# Patient Record
Sex: Male | Born: 1940 | Race: White | Hispanic: No | Marital: Married | Smoking: Former smoker
Health system: Southern US, Community
[De-identification: ages and names within clinical notes are randomized; demographics above are authoritative.]

## PROBLEM LIST (undated history)

## (undated) DIAGNOSIS — R0989 Other specified symptoms and signs involving the circulatory and respiratory systems: Secondary | ICD-10-CM

## (undated) DIAGNOSIS — J189 Pneumonia, unspecified organism: Secondary | ICD-10-CM

## (undated) DIAGNOSIS — I2510 Atherosclerotic heart disease of native coronary artery without angina pectoris: Secondary | ICD-10-CM

## (undated) DIAGNOSIS — M1990 Unspecified osteoarthritis, unspecified site: Secondary | ICD-10-CM

## (undated) DIAGNOSIS — K589 Irritable bowel syndrome without diarrhea: Secondary | ICD-10-CM

## (undated) DIAGNOSIS — Z8546 Personal history of malignant neoplasm of prostate: Secondary | ICD-10-CM

## (undated) DIAGNOSIS — C801 Malignant (primary) neoplasm, unspecified: Secondary | ICD-10-CM

## (undated) DIAGNOSIS — J449 Chronic obstructive pulmonary disease, unspecified: Secondary | ICD-10-CM

## (undated) DIAGNOSIS — K5730 Diverticulosis of large intestine without perforation or abscess without bleeding: Secondary | ICD-10-CM

## (undated) DIAGNOSIS — S329XXA Fracture of unspecified parts of lumbosacral spine and pelvis, initial encounter for closed fracture: Secondary | ICD-10-CM

## (undated) DIAGNOSIS — R918 Other nonspecific abnormal finding of lung field: Secondary | ICD-10-CM

## (undated) DIAGNOSIS — I714 Abdominal aortic aneurysm, without rupture, unspecified: Secondary | ICD-10-CM

## (undated) DIAGNOSIS — I700 Atherosclerosis of aorta: Secondary | ICD-10-CM

## (undated) DIAGNOSIS — K635 Polyp of colon: Secondary | ICD-10-CM

## (undated) DIAGNOSIS — R0602 Shortness of breath: Secondary | ICD-10-CM

## (undated) DIAGNOSIS — R011 Cardiac murmur, unspecified: Secondary | ICD-10-CM

## (undated) HISTORY — PX: HERNIA REPAIR: SHX51

## (undated) HISTORY — DX: Fracture of unspecified parts of lumbosacral spine and pelvis, initial encounter for closed fracture: S32.9XXA

## (undated) HISTORY — DX: Diverticulosis of large intestine without perforation or abscess without bleeding: K57.30

## (undated) HISTORY — DX: Chronic obstructive pulmonary disease, unspecified: J44.9

## (undated) HISTORY — DX: Irritable bowel syndrome, unspecified: K58.9

## (undated) HISTORY — DX: Abdominal aortic aneurysm, without rupture: I71.4

## (undated) HISTORY — DX: Atherosclerosis of aorta: I70.0

## (undated) HISTORY — DX: Abdominal aortic aneurysm, without rupture, unspecified: I71.40

## (undated) HISTORY — DX: Other specified symptoms and signs involving the circulatory and respiratory systems: R09.89

## (undated) HISTORY — DX: Personal history of malignant neoplasm of prostate: Z85.46

## (undated) HISTORY — DX: Polyp of colon: K63.5

## (undated) HISTORY — PX: OTHER SURGICAL HISTORY: SHX169

## (undated) HISTORY — PX: TONSILLECTOMY: SUR1361

## (undated) HISTORY — DX: Unspecified osteoarthritis, unspecified site: M19.90

## (undated) HISTORY — DX: Other nonspecific abnormal finding of lung field: R91.8

## (undated) HISTORY — DX: Atherosclerotic heart disease of native coronary artery without angina pectoris: I25.10

## (undated) HISTORY — PX: JOINT REPLACEMENT: SHX530

## (undated) NOTE — *Deleted (*Deleted)
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 Moved to ICU 11/2 for resp failure Vol overload, admit pH 7.2, pCO2 52, bicarb 22  Anticoagulation - eliquis 2.5BID x4w s/p DCCV >> heparin gtt Infectious Disease - fluconazole 200q24, (Few candida on sputum culture from 10/28), zosyn 3.375g q8, vanc 1g q24h (d/cd neg mrsa swab) Cardiovascular - EF 50-55%  amio gtt>>PO, ator10, lopressor 62.5 >>25 Endocrinology - cbgs <180 Gastrointestinal / Nutrition - TF, ppi Neurology -precedex, propofol gtts   Nephrology - AKI  SCr 10/26 1.7>> 10/29 2.6 >>11/2 2.5>>  11/3 2.7 > 2.6 > 2.5 >>2.1 UOP improving w/ diuresis, 11/2 (s/p lasix 160 + 25g albumin)  Aranesp 100 mcg qMon for anemia of AKI * Outpatient ESA/iron orders: N/a * Last Tsat and ferritin - N/a (transfusion on 10/30) * Last doses of Aranesp given on 11/8 * Hgb 11.2 on 11/9, next dose due 11/15 - will need to ensure Hgb <11 before then  PLT WNL. next dose due 11/8   TSAT - N/a - transfused on 10/30 --K - 3.6 (replacing w/ kcit 11/4) >> 4.3 CoCa >9, phos 3-4 NaHCO3 tabs 1,300mg  BID >> HCO3 improved, stopped   Albumin 2.5  Pulmonary infiltrates on CXR. Intubated 11/5 AM for WOB and O2 reqs Hematology / Oncology PTA Medication Issues Best Practices   AKI, met/resp acidosis + diuresis Continue monitoring SCr, UOP and lytes w/ IV diuresis ---KCl >> k citrate, replaced mag -albumin and NaHCO3 d/cd today -Watch Na - free water incr to 300q4  Hypertension -cont metop  -monitor bp, consider adding amlo or hydral if needed  Afib w/ RVR Amiodarone PO 11/4 >> drip 11/9 Cont heparin, lopressor Hgb stable  Pulmonary infiltrates c/f PNA Intubated, f/u trach 11/10  D/cd vancomycin Cont zosyn to 11/5 (7d) - may need longer

---

## 1990-08-29 HISTORY — PX: PROSTATECTOMY: SHX69

## 1997-09-28 HISTORY — PX: KNEE ARTHROSCOPY: SUR90

## 1998-08-29 ENCOUNTER — Encounter: Payer: Self-pay | Admitting: Family Medicine

## 2001-04-21 ENCOUNTER — Encounter: Payer: Self-pay | Admitting: Family Medicine

## 2001-04-21 LAB — CONVERTED CEMR LAB: PSA: 0 ng/mL

## 2001-05-25 ENCOUNTER — Ambulatory Visit (HOSPITAL_COMMUNITY): Admission: RE | Admit: 2001-05-25 | Discharge: 2001-05-25 | Payer: Self-pay | Admitting: Gastroenterology

## 2001-05-25 ENCOUNTER — Encounter (INDEPENDENT_AMBULATORY_CARE_PROVIDER_SITE_OTHER): Payer: Self-pay

## 2001-06-07 ENCOUNTER — Encounter: Payer: Self-pay | Admitting: Neurosurgery

## 2001-06-07 ENCOUNTER — Ambulatory Visit (HOSPITAL_COMMUNITY): Admission: RE | Admit: 2001-06-07 | Discharge: 2001-06-07 | Payer: Self-pay | Admitting: Neurosurgery

## 2003-07-21 LAB — FECAL OCCULT BLOOD, GUAIAC: Fecal Occult Blood: NEGATIVE

## 2005-05-19 ENCOUNTER — Ambulatory Visit: Payer: Self-pay | Admitting: Family Medicine

## 2005-07-28 ENCOUNTER — Ambulatory Visit: Payer: Self-pay | Admitting: Family Medicine

## 2005-09-04 ENCOUNTER — Ambulatory Visit: Payer: Self-pay | Admitting: General Surgery

## 2005-09-23 ENCOUNTER — Encounter: Admission: RE | Admit: 2005-09-23 | Discharge: 2005-09-23 | Payer: Self-pay | Admitting: Orthopedic Surgery

## 2005-10-14 ENCOUNTER — Encounter: Admission: RE | Admit: 2005-10-14 | Discharge: 2005-10-14 | Payer: Self-pay | Admitting: Orthopedic Surgery

## 2005-11-11 ENCOUNTER — Ambulatory Visit: Payer: Self-pay | Admitting: Family Medicine

## 2005-11-13 ENCOUNTER — Ambulatory Visit: Payer: Self-pay | Admitting: Family Medicine

## 2006-11-30 ENCOUNTER — Ambulatory Visit: Payer: Self-pay | Admitting: Family Medicine

## 2006-11-30 DIAGNOSIS — C61 Malignant neoplasm of prostate: Secondary | ICD-10-CM | POA: Insufficient documentation

## 2006-11-30 LAB — CONVERTED CEMR LAB
ALT: 18 units/L (ref 0–40)
AST: 22 units/L (ref 0–37)
Albumin: 3.9 g/dL (ref 3.5–5.2)
Alkaline Phosphatase: 63 units/L (ref 39–117)
BUN: 14 mg/dL (ref 6–23)
Basophils Absolute: 0 10*3/uL (ref 0.0–0.1)
Basophils Relative: 0.5 % (ref 0.0–1.0)
Bilirubin, Direct: 0.1 mg/dL (ref 0.0–0.3)
CO2: 28 meq/L (ref 19–32)
Calcium: 9.1 mg/dL (ref 8.4–10.5)
Chloride: 109 meq/L (ref 96–112)
Cholesterol: 173 mg/dL (ref 0–200)
Creatinine, Ser: 0.9 mg/dL (ref 0.4–1.5)
Eosinophils Absolute: 0.4 10*3/uL (ref 0.0–0.6)
Eosinophils Relative: 6.1 % — ABNORMAL HIGH (ref 0.0–5.0)
GFR calc Af Amer: 109 mL/min
GFR calc non Af Amer: 90 mL/min
Glucose, Bld: 98 mg/dL (ref 70–99)
HCT: 45.1 % (ref 39.0–52.0)
HDL: 41.9 mg/dL (ref >39.0)
Hemoglobin: 16 g/dL (ref 13.0–17.0)
LDL Cholesterol: 112 mg/dL — ABNORMAL HIGH (ref 0–99)
Lymphocytes Relative: 28.8 % (ref 12.0–46.0)
MCHC: 35.4 g/dL (ref 30.0–36.0)
MCV: 94.8 fL (ref 78.0–100.0)
Monocytes Absolute: 0.8 10*3/uL — ABNORMAL HIGH (ref 0.2–0.7)
Monocytes Relative: 11.1 % — ABNORMAL HIGH (ref 3.0–11.0)
Neutro Abs: 3.6 10*3/uL (ref 1.4–7.7)
Neutrophils Relative %: 53.5 % (ref 43.0–77.0)
PSA: 0.01 ng/mL — ABNORMAL LOW (ref 0.10–4.00)
Platelets: 239 10*3/uL (ref 150–400)
Potassium: 4.4 meq/L (ref 3.5–5.1)
RBC: 4.76 M/uL (ref 4.22–5.81)
RDW: 12.8 % (ref 11.5–14.6)
Sodium: 142 meq/L (ref 135–145)
TSH: 1.18 microintl units/mL (ref 0.35–5.50)
Total Bilirubin: 1 mg/dL (ref 0.3–1.2)
Total CHOL/HDL Ratio: 4.1
Total Protein: 6.4 g/dL (ref 6.0–8.3)
Triglycerides: 96 mg/dL (ref 0–149)
VLDL: 19 mg/dL (ref 0–40)
WBC: 6.8 10*3/uL (ref 4.5–10.5)

## 2006-12-01 ENCOUNTER — Encounter: Payer: Self-pay | Admitting: Family Medicine

## 2006-12-01 DIAGNOSIS — E785 Hyperlipidemia, unspecified: Secondary | ICD-10-CM | POA: Insufficient documentation

## 2006-12-01 DIAGNOSIS — G8929 Other chronic pain: Secondary | ICD-10-CM | POA: Insufficient documentation

## 2006-12-01 DIAGNOSIS — K5730 Diverticulosis of large intestine without perforation or abscess without bleeding: Secondary | ICD-10-CM | POA: Insufficient documentation

## 2006-12-01 DIAGNOSIS — Z8546 Personal history of malignant neoplasm of prostate: Secondary | ICD-10-CM | POA: Insufficient documentation

## 2006-12-01 DIAGNOSIS — J449 Chronic obstructive pulmonary disease, unspecified: Secondary | ICD-10-CM | POA: Insufficient documentation

## 2006-12-01 DIAGNOSIS — M549 Dorsalgia, unspecified: Secondary | ICD-10-CM

## 2006-12-02 ENCOUNTER — Ambulatory Visit: Payer: Self-pay | Admitting: Family Medicine

## 2006-12-02 DIAGNOSIS — Z87891 Personal history of nicotine dependence: Secondary | ICD-10-CM | POA: Insufficient documentation

## 2006-12-17 ENCOUNTER — Ambulatory Visit: Payer: Self-pay | Admitting: Family Medicine

## 2007-01-13 ENCOUNTER — Encounter (INDEPENDENT_AMBULATORY_CARE_PROVIDER_SITE_OTHER): Payer: Self-pay | Admitting: *Deleted

## 2007-01-13 ENCOUNTER — Ambulatory Visit: Payer: Self-pay | Admitting: Family Medicine

## 2007-10-18 ENCOUNTER — Telehealth: Payer: Self-pay | Admitting: Family Medicine

## 2007-12-29 HISTORY — PX: COLONOSCOPY: SHX174

## 2007-12-30 ENCOUNTER — Encounter: Payer: Self-pay | Admitting: Family Medicine

## 2007-12-30 LAB — HM COLONOSCOPY: HM Colonoscopy: 2

## 2008-01-24 ENCOUNTER — Encounter: Payer: Self-pay | Admitting: Family Medicine

## 2008-03-13 ENCOUNTER — Ambulatory Visit: Payer: Self-pay | Admitting: Family Medicine

## 2008-03-13 LAB — CONVERTED CEMR LAB
ALT: 17 units/L (ref 0–53)
Albumin: 3.9 g/dL (ref 3.5–5.2)
Alkaline Phosphatase: 67 units/L (ref 39–117)
BUN: 11 mg/dL (ref 6–23)
CO2: 30 meq/L (ref 19–32)
Calcium: 9.2 mg/dL (ref 8.4–10.5)
Eosinophils Relative: 5.6 % — ABNORMAL HIGH (ref 0.0–5.0)
GFR calc Af Amer: 108 mL/min
Glucose, Bld: 103 mg/dL — ABNORMAL HIGH (ref 70–99)
HCT: 47.5 % (ref 39.0–52.0)
Hemoglobin: 16.5 g/dL (ref 13.0–17.0)
Monocytes Absolute: 0.7 10*3/uL (ref 0.1–1.0)
Monocytes Relative: 10.6 % (ref 3.0–12.0)
Neutro Abs: 3.9 10*3/uL (ref 1.4–7.7)
Potassium: 5.2 meq/L — ABNORMAL HIGH (ref 3.5–5.1)
RBC: 4.82 M/uL (ref 4.22–5.81)
Total CHOL/HDL Ratio: 3.8
Total Protein: 6.5 g/dL (ref 6.0–8.3)
WBC: 6.6 10*3/uL (ref 4.5–10.5)

## 2008-03-15 ENCOUNTER — Ambulatory Visit: Payer: Self-pay | Admitting: Family Medicine

## 2008-11-06 ENCOUNTER — Telehealth: Payer: Self-pay | Admitting: Family Medicine

## 2008-11-07 ENCOUNTER — Telehealth: Payer: Self-pay | Admitting: Family Medicine

## 2009-01-26 ENCOUNTER — Encounter: Payer: Self-pay | Admitting: Family Medicine

## 2009-01-26 ENCOUNTER — Emergency Department: Payer: Self-pay | Admitting: Emergency Medicine

## 2009-01-27 ENCOUNTER — Encounter: Payer: Self-pay | Admitting: Family Medicine

## 2009-01-31 ENCOUNTER — Ambulatory Visit: Payer: Self-pay | Admitting: Family Medicine

## 2009-04-13 ENCOUNTER — Ambulatory Visit: Payer: Self-pay | Admitting: Family Medicine

## 2009-04-13 LAB — CONVERTED CEMR LAB
Albumin: 4.2 g/dL (ref 3.5–5.2)
Alkaline Phosphatase: 61 units/L (ref 39–117)
Basophils Absolute: 0 10*3/uL (ref 0.0–0.1)
Calcium: 9.2 mg/dL (ref 8.4–10.5)
Cholesterol: 184 mg/dL (ref 0–200)
Creatinine, Ser: 0.9 mg/dL (ref 0.4–1.5)
Eosinophils Absolute: 0.4 10*3/uL (ref 0.0–0.7)
GFR calc non Af Amer: 88.93 mL/min (ref >60)
Glucose, Bld: 95 mg/dL (ref 70–99)
HCT: 46 % (ref 39.0–52.0)
HDL: 43.1 mg/dL (ref >39.00)
Lymphs Abs: 1.5 10*3/uL (ref 0.7–4.0)
MCHC: 34.3 g/dL (ref 30.0–36.0)
MCV: 99.2 fL (ref 78.0–100.0)
Monocytes Absolute: 0.6 10*3/uL (ref 0.1–1.0)
Neutrophils Relative %: 54.3 % (ref 43.0–77.0)
PSA: 0.02 ng/mL — ABNORMAL LOW (ref 0.10–4.00)
Platelets: 208 10*3/uL (ref 150.0–400.0)
RDW: 13.2 % (ref 11.5–14.6)
Sodium: 140 meq/L (ref 135–145)
TSH: 1.38 microintl units/mL (ref 0.35–5.50)
Triglycerides: 118 mg/dL (ref 0.0–149.0)
VLDL: 23.6 mg/dL (ref 0.0–40.0)
WBC: 5.5 10*3/uL (ref 4.5–10.5)

## 2009-04-19 ENCOUNTER — Ambulatory Visit: Payer: Self-pay | Admitting: Family Medicine

## 2009-07-05 ENCOUNTER — Ambulatory Visit: Payer: Self-pay | Admitting: Family Medicine

## 2009-07-16 ENCOUNTER — Telehealth: Payer: Self-pay | Admitting: Family Medicine

## 2009-07-24 ENCOUNTER — Ambulatory Visit: Payer: Self-pay | Admitting: Family Medicine

## 2010-02-04 ENCOUNTER — Encounter (INDEPENDENT_AMBULATORY_CARE_PROVIDER_SITE_OTHER): Payer: Self-pay | Admitting: *Deleted

## 2010-04-18 ENCOUNTER — Ambulatory Visit: Payer: Self-pay | Admitting: Family Medicine

## 2010-04-18 DIAGNOSIS — M25519 Pain in unspecified shoulder: Secondary | ICD-10-CM | POA: Insufficient documentation

## 2010-05-31 ENCOUNTER — Encounter: Payer: Self-pay | Admitting: Family Medicine

## 2010-08-01 NOTE — Assessment & Plan Note (Signed)
Summary: stomach issues/dlo   Vital Signs:  Patient profile:   70 year old male Weight:      218.75 pounds BMI:     27.62 Temp:     98.8 degrees F oral Pulse rate:   64 / minute Pulse rhythm:   irregular BP sitting:   140 / 78  (left arm) Cuff size:   large  Vitals Entered By: Sydell Axon LPN (April 18, 2010 11:31 AM) CC: Right heel is sore, right should pain/ache and problem with IBS   History of Present Illness: Pt here for "old age" type stuff. A few mos ago had soreness and feeling of swelling of right heel. He then developed pins and needles in the left shoulder which he read could possibly be gout and he started drinking black cherry juice. That helped and then went to the right shoulder. This also has now gone to  the right heel. The cherry juice did not help the right shoulder or rright heel. 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  Problems Prior to Update: 1)  Screening For Malignannt Neoplasm, Site Nec  (ICD-V76.49) 2)  Health Maintenance Exam  (ICD-V70.0) 3)  Disorder, Tobacco Use  (ICD-305.1) 4)  Hyperlipidemia  (ICD-272.4) 5)  Hx of Back Pain, Chronic  (ICD-724.5) 6)  Diverticulosis, Colon  (ICD-562.10) 7)  COPD  (ICD-496) 8)  Prostate Cancer, Hx of  (ICD-V10.46) 9)  Neop, Malignant, Prostate  (ICD-185)  Medications Prior to Update: 1)  Spiriva Handihaler 18 Mcg Caps (Tiotropium Bromide Monohydrate) .... Inhale 1 Capsule By Mouth Once A Day 2)  Advair Diskus 100-50 Mcg/dose Aepb (Fluticasone-Salmeterol) .... Inhale One Dose Every 12 Hours As Needed Cough  Allergies: 1)  Sulfa  Physical Exam  General:  Well-developed,well-nourished,in no acute distress; alert,appropriate and cooperative throughout examination, looks washed out but is normally interactive. Head:  Normocephalic and atraumatic without  obvious abnormalities. No apparent alopecia or balding. Eyes:  Conjunctiva clear bilat. Ears:  External ear exam shows no significant lesions or deformities.  Otoscopic examination reveals clear canals, tympanic membranes are intact bilaterally without bulging, retraction, inflammation or discharge. Hearing is grossly normal bilaterally. Nose:  External nasal examination shows no deformity or inflammation. Nasal mucosa are pink and moist without lesions or exudates. Mouth:  Oral mucosa and oropharynx without lesions or exudates.  Teeth in good repair. Neck:  No deformities, masses, or tenderness noted. Lungs:  Normal respiratory effort, chest expands symmetrically. Lungs are clear to auscultation, no crackles or wheezes. Heart:  Normal rate and regular rhythm. S1 and S2 normal without gallop, murmur, click, rub or other extra sounds. Msk:  No deformity or scoliosis noted of thoracic or lumbar spine.  Shoulders without swelling, erythema or warmth. Extremities:  No clubbing, cyanosis, edema, or deformity noted with normal full range of motion of all joints.     Impression & Recommendations:  Problem # 1:  SHOULDER PAIN, RIGHT (ICD-719.41) Assessment New With heel involvement also...really stinging and tingling. Discussed uric acid and crystal formation. Does not sound like classic gout to me and have never seen gout in the shoulder...will get uric acid and if high, gives the diagnosis. If low, does not mean he does not have it and would repeat at PE.  Trmt is same either way...since Tyl helps, use as directed. Also would use heat  and ice  trmt as able. His updated medication list for this problem includes:    Advil 200 Mg Tabs (Ibuprofen) .Marland Kitchen... As needed    Tylenol Extra Strength 500 Mg Tabs (Acetaminophen) .Marland Kitchen... As needed  Orders: Venipuncture (08657) TLB-Uric Acid, Blood (84550-URIC)  Complete Medication List: 1)  Spiriva Handihaler 18 Mcg Caps (Tiotropium bromide monohydrate) ....  Inhale 1 capsule by mouth once a day 2)  Advair Diskus 100-50 Mcg/dose Aepb (Fluticasone-salmeterol) .... Inhale one dose every 12 hours as needed cough 3)  Advil 200 Mg Tabs (Ibuprofen) .... As needed 4)  Tylenol Extra Strength 500 Mg Tabs (Acetaminophen) .... As needed 5)  Milk of Magnesia 400 Mg/45ml Susp (Magnesium hydroxide) .... As needed   Orders Added: 1)  Venipuncture [36415] 2)  TLB-Uric Acid, Blood [84550-URIC] 3)  Est. Patient Level III [84696]    Current Allergies (reviewed today): SULFA

## 2010-08-01 NOTE — Assessment & Plan Note (Signed)
Summary: ROA   CYD   Vital Signs:  Patient profile:   70 year old male Weight:      221 pounds Temp:     98.4 degrees F oral Pulse rate:   72 / minute Pulse rhythm:   regular BP sitting:   110 / 64  (left arm) Cuff size:   large  Vitals Entered By: Sydell Axon LPN (July 24, 2009 3:45 PM) CC: follow-up visit   History of Present Illness: Pt here for followup of COPD exacerbation with bronchitis and early pneumonia. He has done well on Zithro and addition of Advair to Spiriva. He has successfully styed off cigarettes altho he stilldoes not perceive an improvement in breathing. He is back to baseline and very comfortable. He has no wheezing and feels well. He is still using Advair.  Problems Prior to Update: 1)  Bronchitis- Acute  (ICD-466.0) 2)  Screening For Malignannt Neoplasm, Site Nec  (ICD-V76.49) 3)  Health Maintenance Exam  (ICD-V70.0) 4)  Disorder, Tobacco Use  (ICD-305.1) 5)  Hyperlipidemia  (ICD-272.4) 6)  Hx of Back Pain, Chronic  (ICD-724.5) 7)  Diverticulosis, Colon  (ICD-562.10) 8)  COPD  (ICD-496) 9)  Prostate Cancer, Hx of  (ICD-V10.46) 10)  Neop, Malignant, Prostate  (ICD-185)  Medications Prior to Update: 1)  Spiriva Handihaler 18 Mcg Caps (Tiotropium Bromide Monohydrate) .... Inhale 1 Capsule By Mouth Once A Day 2)  Zithromax 250 Mg Tabs (Azithromycin) .... As Directed 3)  Advair Diskus 100-50 Mcg/dose Aepb (Fluticasone-Salmeterol) .... Inhale One Dose Every 12 Hours As Needed Cough  Allergies: 1)  Sulfa  Physical Exam  General:  Well-developed,well-nourished,in no acute distress; alert,appropriate and cooperative throughout examination, looks washed out but is normally interactive. Head:  Normocephalic and atraumatic without obvious abnormalities. No apparent alopecia or balding. Eyes:  Conjunctiva clear bilat. Ears:  External ear exam shows no significant lesions or deformities.  Otoscopic examination reveals clear canals, tympanic membranes are  intact bilaterally without bulging, retraction, inflammation or discharge. Hearing is grossly normal bilaterally. Nose:  External nasal examination shows no deformity or inflammation. Nasal mucosa are pink and moist without lesions or exudates. Mouth:  Oral mucosa and oropharynx without lesions or exudates.  Teeth in good repair. Neck:  No deformities, masses, or tenderness noted. Chest Wall:  No deformities, masses, tenderness or gynecomastia noted. Lungs:  Normal respiratory effort, chest expands symmetrically. Lungs are clear to auscultation, no crackles or wheezes. Heart:  Normal rate and regular rhythm. S1 and S2 normal without gallop, murmur, click, rub or other extra sounds.   Impression & Recommendations:  Problem # 1:  BRONCHITIS- ACUTE (ICD-466.0) Assessment Improved Essentially resolved. Cont curr med regimen and slowly taper off Advair as tiolwerated. Cont Spiriva for long time. The following medications were removed from the medication list:    Zithromax 250 Mg Tabs (Azithromycin) .Marland Kitchen... As directed His updated medication list for this problem includes:    Spiriva Handihaler 18 Mcg Caps (Tiotropium bromide monohydrate) ..... Inhale 1 capsule by mouth once a day    Advair Diskus 100-50 Mcg/dose Aepb (Fluticasone-salmeterol) ..... Inhale one dose every 12 hours as needed cough  Problem # 2:  DISORDER, TOBACCO USE (ICD-305.1) Assessment: Improved  Stay off cigarettes.  Complete Medication List: 1)  Spiriva Handihaler 18 Mcg Caps (Tiotropium bromide monohydrate) .... Inhale 1 capsule by mouth once a day 2)  Advair Diskus 100-50 Mcg/dose Aepb (Fluticasone-salmeterol) .... Inhale one dose every 12 hours as needed cough  Patient Instructions: 1)  RTC  as needed.  Current Allergies (reviewed today): SULFA

## 2010-08-01 NOTE — Letter (Signed)
Summary: Nadara Eaton letter  [facility]  [address] [place], [place] [address]   Phone: [phone]  Fax: [phone]       02/04/2010 MRN: [id number]  DAIVION PAPE [address] [place], [place]  [address]  Dear Mr. [facility] [place], and [place] announce the retirement of Arta Silence, M.D., from full-time practice at the [facility] office effective December 27, 2009 and his plans of returning part-time.  It is important to Dr. Hetty Ely and to our practice that you understand that [facility] has seven physicians in our office for your health care needs.  We will continue to offer the same exceptional care that you have today.    Dr. Hetty Ely has spoken to many of you about his plans for retirement and returning part-time in the fall.   We will continue to work with you through the transition to schedule appointments for you in the office and meet the high standards that [facility] is committed to.   Again, it is with great pleasure that we share the news that Dr. Hetty Ely will return to [facility] in October of 2011 with a reduced schedule.    If you have any questions, or would like to request an appointment with one of our physicians, please call us at [phone] and press the option for Scheduling an appointment.  We take pleasure in providing you with excellent patient care and look forward to seeing you at your next office visit.  Our [facility] Physicians are:  Tillman Abide, M.D. Laurita Quint, M.D. Roxy Manns, M.D. Kerby Nora, M.D. Hannah Beat, M.D. Ruthe Mannan, M.D. We proudly welcomed Raechel Ache, M.D. and Eustaquio Boyden, M.D. to the practice in July/August 2011.  Sincerely,  [facility]

## 2010-08-01 NOTE — Assessment & Plan Note (Signed)
Summary: FEVER CONGESTION/DLO   Vital Signs:  Patient profile:   70 year old male Weight:      214 pounds O2 Sat:      92 % on Room air Temp:     97.8 degrees F oral Pulse rate:   88 / minute Pulse rhythm:   regular Resp:     28 per minute BP sitting:   110 / 70  (left arm) Cuff size:   regular  Vitals Entered By: Sydell Axon LPN (July 05, 2009 4:02 PM)  O2 Flow:  Room air CC: Fever, congestion, went to Urgent Care over the holidays   History of Present Illness: Carlos Olson is a 70 y/o male who presents today with his wife for  2 weeks of lethargy for which he sleeps much of the day, productive cough of green sputum, headaches, and fever.  His fever has reached 103 degrees, three days ago, down to 100-101 after starting Zithromax.Marland Kitchen  He denies any sore throat or sinus pressure.  He has been to two urgent care facilities over the last 2 weeks. He was given OTC meds for viral URI initially and  Azithromycin with  Advair on the last visit.  Today is his last day on the Azithromycin and he feels that the Advair has done little to help.  He has already had his flu vaccine in September. He has also had nausea at times, no abd pain and nml BMs, is minimally achey but is rundown. He has signif h/o smoking but not in the last few years. He is on Spiriva regularly.  Preventive Screening-Counseling & Management  Alcohol-Tobacco     Alcohol drinks/day: 1     Alcohol type: bourbon     Smoking Status: quit > 6 months     Year Quit: 2009     Pack years: 54     Cans of tobacco/week: no     Passive Smoke Exposure: no  Caffeine-Diet-Exercise     Caffeine use/day: 4     Does Patient Exercise: yes     Type of exercise: yardwork,golf     Times/week: 7  Problems Prior to Update: 1)  Screening For Malignannt Neoplasm, Site Nec  (ICD-V76.49) 2)  Health Maintenance Exam  (ICD-V70.0) 3)  Disorder, Tobacco Use  (ICD-305.1) 4)  Hyperlipidemia  (ICD-272.4) 5)  Hx of Back Pain, Chronic   (ICD-724.5) 6)  Diverticulosis, Colon  (ICD-562.10) 7)  COPD  (ICD-496) 8)  Prostate Cancer, Hx of  (ICD-V10.46) 9)  Neop, Malignant, Prostate  (ICD-185)  Medications Prior to Update: 1)  Spiriva Handihaler 18 Mcg Caps (Tiotropium Bromide Monohydrate) .... Inhale 1 Capsule By Mouth Once A Day 2)  Vicodin 5-500 Mg Tabs (Hydrocodone-Acetaminophen) .... One Tab By Mouth At Night  Allergies: 1)  Sulfa  Physical Exam  General:  Well-developed,well-nourished,in no acute distress; alert,appropriate and cooperative throughout examination, looks washed out but is normally interactive. Head:  Normocephalic and atraumatic without obvious abnormalities. No apparent alopecia or balding. Eyes:  Conjunctiva clear bilat. Ears:  External ear exam shows no significant lesions or deformities.  Otoscopic examination reveals clear canals, tympanic membranes are intact bilaterally without bulging, retraction, inflammation or discharge. Hearing is grossly normal bilaterally. Nose:  External nasal examination shows no deformity or inflammation. Nasal mucosa are pink and moist without lesions or exudates. Mouth:  Oral mucosa and oropharynx without lesions or exudates.  Teeth in good repair. Neck:  No deformities, masses, or tenderness noted. Lungs:  Some abnormal breath  sounds/crackles present in lower right lobe.  Somewhat laborious respiratory effort.  Chest expnads symmetrically.  Heart:  Normal rate and regular rhythm. S1 and S2 normal without gallop, murmur, click, rub or other extra sounds.   Impression & Recommendations:  Problem # 1:  BRONCHITIS- ACUTE (ICD-466.0) Assessment New  Poss early pneaumonia in COPD pt already given Zithro and Advair. See instructions. Will see back on Mon. Cont Advair...discussed technique. His updated medication list for this problem includes:    Spiriva Handihaler 18 Mcg Caps (Tiotropium bromide monohydrate) ..... Inhale 1 capsule by mouth once a day    Zithromax 250  Mg Tabs (Azithromycin) .Marland Kitchen... As directed    Advair Diskus 100-50 Mcg/dose Aepb (Fluticasone-salmeterol) ..... Inhale one dose every 12 hours as needed cough  Take antibiotics and other medications as directed. Encouraged to push clear liquids, get enough rest, and take acetaminophen as needed. To be seen in 5-7 days if no improvement, sooner if worse.  Problem # 2:  COPD (ICD-496) Assessment: Unchanged Complicating the above. Cont Spiriva. His updated medication list for this problem includes:    Spiriva Handihaler 18 Mcg Caps (Tiotropium bromide monohydrate) ..... Inhale 1 capsule by mouth once a day    Advair Diskus 100-50 Mcg/dose Aepb (Fluticasone-salmeterol) ..... Inhale one dose every 12 hours as needed cough  Complete Medication List: 1)  Spiriva Handihaler 18 Mcg Caps (Tiotropium bromide monohydrate) .... Inhale 1 capsule by mouth once a day 2)  Zithromax 250 Mg Tabs (Azithromycin) .... As directed 3)  Advair Diskus 100-50 Mcg/dose Aepb (Fluticasone-salmeterol) .... Inhale one dose every 12 hours as needed cough  Patient Instructions: 1)  Take Ave3lox 400mg  daily for 5 days (given samps) 2)  Take Guaifenesin by [facility] or [facility] and getting MUCOUS RELIEF EXPECTORANT (400mg ), take 11/2 tabs by mouth AM and NOON. 3)  Drink lots of fluids anytime taking Guaifenesin.  4)  Tyl ES 2 tabs by mouth three times a day  5)  Vicks as needed  6)  Advair and Siriva as dir.  Current Allergies (reviewed today): SULFA

## 2010-08-01 NOTE — Progress Notes (Signed)
Summary: RX Spiriva  Phone Note Call from Patient Call back at [phone]   Caller: Patient Call For: Shaune Leeks MD Summary of Call: Patient called to request a refill on his Spiriva with  5-6 refills. Pharmacy-[facility]. Initial call taken by: Sydell Axon LPN,  July 16, 2009 10:25 AM  Follow-up for Phone Call        Patient notified that rx has been sent to pharmacy per his request. Follow-up by: Sydell Axon LPN,  July 16, 2009 10:27 AM    Prescriptions: SPIRIVA HANDIHALER 18 MCG CAPS (TIOTROPIUM BROMIDE MONOHYDRATE) Inhale 1 capsule by mouth once a day  #30 Capsule x 6   Entered by:   Sydell Axon LPN   Authorized by:   Shaune Leeks MD   Signed by:   Sydell Axon LPN on 11/91/4782   Method used:   Electronically to        [facility]. [address] [phone]* (retail)       [address] [place], [place]  [id number]       Ph: [phone]       Fax: [phone]   RxID:   [id number]

## 2010-08-01 NOTE — Letter (Signed)
Summary: Annual Follow Up Questionnaire from [facility]    Annual Follow Up Questionnaire from [facility]   Imported By: Beau Fanny 06/07/2010 14:53:15  _____________________________________________________________________  External Attachment:    Type:   Image     Comment:   External Document

## 2011-03-26 ENCOUNTER — Telehealth: Payer: Self-pay | Admitting: Family Medicine

## 2011-03-26 NOTE — Telephone Encounter (Signed)
Rx called to pharmacy for shingles vaccine. Patient notified by telephone that rx has been called to pharmacy.

## 2011-03-26 NOTE — Telephone Encounter (Signed)
Pt requests shingles shot script called into [facility] at [address].  Call back is [phone]

## 2011-05-05 ENCOUNTER — Other Ambulatory Visit: Payer: Self-pay | Admitting: Family Medicine

## 2011-05-05 DIAGNOSIS — E785 Hyperlipidemia, unspecified: Secondary | ICD-10-CM

## 2011-05-05 DIAGNOSIS — C61 Malignant neoplasm of prostate: Secondary | ICD-10-CM

## 2011-05-05 DIAGNOSIS — F17200 Nicotine dependence, unspecified, uncomplicated: Secondary | ICD-10-CM

## 2011-05-05 DIAGNOSIS — J449 Chronic obstructive pulmonary disease, unspecified: Secondary | ICD-10-CM

## 2011-05-08 ENCOUNTER — Other Ambulatory Visit (INDEPENDENT_AMBULATORY_CARE_PROVIDER_SITE_OTHER): Payer: 59

## 2011-05-08 DIAGNOSIS — E785 Hyperlipidemia, unspecified: Secondary | ICD-10-CM

## 2011-05-08 DIAGNOSIS — F17200 Nicotine dependence, unspecified, uncomplicated: Secondary | ICD-10-CM

## 2011-05-08 DIAGNOSIS — J449 Chronic obstructive pulmonary disease, unspecified: Secondary | ICD-10-CM

## 2011-05-08 DIAGNOSIS — C61 Malignant neoplasm of prostate: Secondary | ICD-10-CM

## 2011-05-08 LAB — LIPID PANEL
HDL: 53 mg/dL (ref >39.00)
LDL Cholesterol: 100 mg/dL — ABNORMAL HIGH (ref 0–99)
Total CHOL/HDL Ratio: 3
VLDL: 11.4 mg/dL (ref 0.0–40.0)

## 2011-05-08 LAB — CBC WITH DIFFERENTIAL/PLATELET
Basophils Absolute: 0.1 10*3/uL (ref 0.0–0.1)
Eosinophils Relative: 5 % (ref 0.0–5.0)
Hemoglobin: 15.1 g/dL (ref 13.0–17.0)
Lymphocytes Relative: 21.7 % (ref 12.0–46.0)
Monocytes Relative: 10.4 % (ref 3.0–12.0)
Neutro Abs: 4.1 10*3/uL (ref 1.4–7.7)
RDW: 13.8 % (ref 11.5–14.6)
WBC: 6.7 10*3/uL (ref 4.5–10.5)

## 2011-05-08 LAB — TSH: TSH: 0.98 u[IU]/mL (ref 0.35–5.50)

## 2011-05-08 LAB — BASIC METABOLIC PANEL
CO2: 29 mEq/L (ref 19–32)
Chloride: 105 mEq/L (ref 96–112)
Sodium: 140 mEq/L (ref 135–145)

## 2011-05-08 LAB — HEPATIC FUNCTION PANEL
ALT: 19 U/L (ref 0–53)
Total Bilirubin: 1 mg/dL (ref 0.3–1.2)

## 2011-05-08 LAB — PSA: PSA: 0.01 ng/mL — ABNORMAL LOW (ref 0.10–4.00)

## 2011-05-14 ENCOUNTER — Encounter: Payer: Self-pay | Admitting: Family Medicine

## 2011-05-15 ENCOUNTER — Encounter: Payer: Self-pay | Admitting: Family Medicine

## 2011-05-15 ENCOUNTER — Ambulatory Visit (INDEPENDENT_AMBULATORY_CARE_PROVIDER_SITE_OTHER): Payer: 59 | Admitting: Family Medicine

## 2011-05-15 DIAGNOSIS — E785 Hyperlipidemia, unspecified: Secondary | ICD-10-CM

## 2011-05-15 DIAGNOSIS — C61 Malignant neoplasm of prostate: Secondary | ICD-10-CM

## 2011-05-15 DIAGNOSIS — Z0000 Encounter for general adult medical examination without abnormal findings: Secondary | ICD-10-CM

## 2011-05-15 DIAGNOSIS — M549 Dorsalgia, unspecified: Secondary | ICD-10-CM

## 2011-05-15 DIAGNOSIS — F17200 Nicotine dependence, unspecified, uncomplicated: Secondary | ICD-10-CM

## 2011-05-15 MED ORDER — TIOTROPIUM BROMIDE MONOHYDRATE 18 MCG IN CAPS: 18.0000 ug | ORAL_CAPSULE | Freq: Every day | RESPIRATORY_TRACT | Status: DC

## 2011-05-15 NOTE — Assessment & Plan Note (Signed)
Being seen by Dr Thurston Hole and soon interventional radiology for injections.

## 2011-05-15 NOTE — Assessment & Plan Note (Signed)
Good nos. Cont diet and lifestyle. Lab Results  Component Value Date   CHOL 164 05/08/2011   CHOL 184 04/13/2009   CHOL 131 03/13/2008   Lab Results  Component Value Date   HDL 53.00 05/08/2011   HDL 16.10 04/13/2009   HDL 34.1* 03/13/2008   Lab Results  Component Value Date   LDLCALC 100* 05/08/2011   LDLCALC 117* 04/13/2009   LDLCALC 85 03/13/2008   Lab Results  Component Value Date   TRIG 57.0 05/08/2011   TRIG 118.0 04/13/2009   TRIG 60 03/13/2008   Lab Results  Component Value Date   CHOLHDL 3 05/08/2011   CHOLHDL 4 04/13/2009   CHOLHDL 3.8 CALC 03/13/2008   No results found for this basename: LDLDIRECT

## 2011-05-15 NOTE — Assessment & Plan Note (Signed)
PSA continues stable undetectable s/p prostatectomy.

## 2011-05-15 NOTE — Patient Instructions (Signed)
Call for appt in the new year as needed or with Dr Para March or Dr Reece Agar for establishment and PE.

## 2011-05-15 NOTE — Assessment & Plan Note (Signed)
Continues off cigarettes.

## 2011-05-15 NOTE — Progress Notes (Signed)
Subjective:    Patient ID: Carlos Olson, male    DOB: Sep 09, 1940, 70 y.o.   MRN: [id number]  HPI Pt here for Comp Exam. He has been seeing Dr August Saucer and Dr Thurston Hole and will be getting eipdurals soon. He had some of these a few years ago and the second one worked pretty well. Hew is hopeful again. He can remember anything if given 5 minutes. He has no problem with directions, etc.  No new acute problems other than the back. He has no sciatica now and was smart enough to stop the exacerbating activity. 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     Review of Systems  Constitutional: Negative for fever, chills, diaphoresis, appetite change, fatigue and unexpected weight change.       He has some muscle weakness seen on Dr Diamantina Providence assessment.  HENT: Positive for hearing loss (chronic mild loss...has been evaled at [facility]). Negative for ear pain, tinnitus and ear discharge.   Eyes: Negative for pain, discharge and visual disturbance.  Respiratory: Negative for cough, shortness of breath and wheezing.   Cardiovascular: Negative for chest pain and palpitations.       No SOB w/ exertion  Gastrointestinal: Positive for diarrhea (from IBS.). Negative for nausea, vomiting, abdominal pain, constipation and blood in stool.       No heartburn or swallowing problems.  Genitourinary: Negative for dysuria, frequency and difficulty urinating.       One to two nocturia  Musculoskeletal: Positive for back pain (see HPI). Negative for myalgias and arthralgias.  Skin: Negative for rash.       No itching or dryness.  Neurological: Positive for light-headedness. Negative for tremors and numbness.       No tingling or balance problems. Dupuetryen's contracture.  Hematological: Negative for adenopathy. Does not bruise/bleed easily.  Psychiatric/Behavioral: Negative for dysphoric mood and agitation.       Objective:   Physical Exam  Constitutional: He is oriented to person, place, and time. He  appears well-developed and well-nourished. No distress.  HENT:  Head: Normocephalic and atraumatic.  Right Ear: External ear normal.  Left Ear: External ear normal.  Nose: Nose normal.  Mouth/Throat: Oropharynx is clear and moist.  Eyes: Conjunctivae and EOM are normal. Pupils are equal, round, and reactive to light. Right eye exhibits no discharge. Left eye exhibits no discharge. No scleral icterus.  Neck: Normal range of motion. Neck supple. No thyromegaly present.  Cardiovascular: Normal rate, regular rhythm, normal heart sounds and intact distal pulses.   No murmur heard. Pulmonary/Chest: Effort normal and breath sounds normal. No respiratory distress. He has no wheezes.  Abdominal: Soft. Bowel sounds are normal. He exhibits no distension and no mass. There is no tenderness. There is no rebound and no guarding.  Genitourinary: Penis normal.       No rectal done.  Musculoskeletal: Normal range of motion. He exhibits no edema.  Lymphadenopathy:    He has no cervical adenopathy.  Neurological: He is alert and oriented to person, place, and time. Coordination normal.  Skin: Skin is warm and dry. No rash noted. He is not diaphoretic.  Psychiatric: He has a normal mood and affect. His behavior is normal. Judgment and thought content normal.          Assessment & Plan:  HMPE  I have personally reviewed the Medicare Annual Wellness questionnaire and have noted 1. The patient's medical and social history 2. Their use of alcohol, tobacco or illicit drugs.  He has been off cigarettes for 4+ years.    3. Their current medications and supplements 4. The patient's functional ability including ADL's, fall risks, home safety risks and hearing or visual             impairment. 5. Diet and physical activities 6. Evidence for depression or mood disorders No evidence of cognitive deficits.

## 2011-07-17 ENCOUNTER — Ambulatory Visit (INDEPENDENT_AMBULATORY_CARE_PROVIDER_SITE_OTHER): Payer: 59 | Admitting: Family Medicine

## 2011-07-17 ENCOUNTER — Encounter: Payer: Self-pay | Admitting: Family Medicine

## 2011-07-17 ENCOUNTER — Ambulatory Visit (INDEPENDENT_AMBULATORY_CARE_PROVIDER_SITE_OTHER)
Admission: RE | Admit: 2011-07-17 | Discharge: 2011-07-17 | Disposition: A | Discharge status: Home / Self Care | Payer: 59 | Source: Ambulatory Visit | Attending: Family Medicine | Admitting: Family Medicine

## 2011-07-17 VITALS — BP 124/78 | HR 84 | Temp 98.5°F | Wt 204.8 lb

## 2011-07-17 DIAGNOSIS — J449 Chronic obstructive pulmonary disease, unspecified: Secondary | ICD-10-CM

## 2011-07-17 DIAGNOSIS — F17200 Nicotine dependence, unspecified, uncomplicated: Secondary | ICD-10-CM

## 2011-07-17 DIAGNOSIS — Z01818 Encounter for other preprocedural examination: Secondary | ICD-10-CM

## 2011-07-17 NOTE — Assessment & Plan Note (Addendum)
H/o HLD diet controlled currently. H/o COPD, stable. H/o smoking but quit 2008. H/o prostate cancer s/p prostatectomy. No fmhx CAD. Has tolerated GETA in past well. EKG and CXR today overall normal. EKG - NSR at 66, normal axis, intervals, no hypertrophy or acute ST/T changes.  No change from prior EKG 2010. Reviewed blood work from 05/2011. Pt cleared for surgery.

## 2011-07-17 NOTE — Progress Notes (Signed)
Subjective:    Patient ID: Carlos Olson, male    DOB: [birth year], 71 y.o.   MRN: [id number]  HPI CC:  preop clearance  Pleasant 71 yo pt of Dr. Lyndel Pleasure with h/o prostate cancer s/p prostatectomy, HLD, COPD well controlled on spiriva, former smoker (quit 2008) who presents for preop clearance for ACDF C3-4 to be scheduled in upcoming month under GETA.  Overall very healthy except for continued neck pain.  Has had several surgeries with general anesthesia, last was hernia repair 2007.  Never had any reactions to anesthesia.  No recent chest pain/tightness, SOB, dizziness, DOE, cough.  Able to walk up a flight of stairs without trouble.  Medications and allergies reviewed and updated in chart.  Past histories reviewed and updated if relevant as below. Patient Active Problem List  Diagnoses  . NEOP, MALIGNANT, PROSTATE  . HYPERLIPIDEMIA  . DISORDER, TOBACCO USE  . COPD  . DIVERTICULOSIS, COLON  . SHOULDER PAIN, RIGHT  . BACK PAIN, CHRONIC  . PROSTATE CANCER, HX OF   Past Medical History  Diagnosis Date  . History of prostate cancer   . COPD (chronic obstructive pulmonary disease)   . Diverticulosis of colon   . Hyperlipidemia   . Pelvic fracture    Past Surgical History  Procedure Date  . Prostatectomy 08/1990  . Tonsillectomy   . Knee arthroscopy 09/1997    left  . Hernia repair     right 05/1999, left 09/04/05   History  Substance Use Topics  . Smoking status: Former Smoker    Quit date: 05/15/2007  . Smokeless tobacco: Never Used   Comment: quit 06/16/2007  . Alcohol Use: 7.0 oz/week    14 drink(s) per week     1-2 drinks a day   Family History  Problem Relation Age of Onset  . Hypertension Mother   . Cancer Father     bone, prostate  . Diabetes Sister     x 40 years  . Cancer Brother     prostate   Allergies  Allergen Reactions  . Sulfonamide Derivatives     REACTION: itch   Current Outpatient Prescriptions on File Prior to Visit  Medication Sig  Dispense Refill  . acetaminophen (TYLENOL) 500 MG tablet Take by mouth as needed       . magnesium hydroxide (MILK OF MAGNESIA) 400 MG/5ML suspension Take by mouth daily as needed.        . tiotropium (SPIRIVA) 18 MCG inhalation capsule Place 1 capsule (18 mcg total) into inhaler and inhale daily.  30 capsule  11  . naproxen (NAPROSYN) 500 MG tablet Take 500 mg by mouth daily.          Review of Systems Per HPI    Objective:   Physical Exam  Nursing note and vitals reviewed. Constitutional: He appears well-developed and well-nourished. No distress.  HENT:  Head: Normocephalic and atraumatic.  Right Ear: Hearing, tympanic membrane, external ear and ear canal normal.  Left Ear: Hearing, tympanic membrane, external ear and ear canal normal.  Mouth/Throat: Uvula is midline, oropharynx is clear and moist and mucous membranes are normal. No oropharyngeal exudate, posterior oropharyngeal edema, posterior oropharyngeal erythema or tonsillar abscesses.  Eyes: Conjunctivae and EOM are normal. Pupils are equal, round, and reactive to light. No scleral icterus.  Neck: Normal range of motion. Neck supple. Carotid bruit is not present.  Cardiovascular: Normal rate, regular rhythm, normal heart sounds and intact distal pulses.   No murmur heard. Pulmonary/Chest:  Breath sounds normal. No respiratory distress. He has no wheezes. He has no rales.  Musculoskeletal: He exhibits no edema.  Lymphadenopathy:    He has no cervical adenopathy.  Skin: Skin is warm and dry. No rash noted.  Psychiatric: He has a normal mood and affect.       Assessment & Plan:

## 2011-07-17 NOTE — Assessment & Plan Note (Addendum)
Very stable on spiriva. Does not use rescue medication. Quit smoking 5 yrs ago.

## 2011-07-17 NOTE — Patient Instructions (Addendum)
I don't see reason to delay surgery. I will fax over letter to surgeon regarding this. Good to meet you today, call us with questions. Return as needed or around November for next physical.

## 2011-07-22 ENCOUNTER — Encounter (HOSPITAL_COMMUNITY): Payer: Self-pay | Admitting: Respiratory Therapy

## 2011-07-23 ENCOUNTER — Encounter (HOSPITAL_COMMUNITY)
Admission: RE | Admit: 2011-07-23 | Discharge: 2011-07-23 | Disposition: A | Discharge status: Home / Self Care | Payer: Medicare Other | Source: Ambulatory Visit | Attending: Specialist | Admitting: Specialist

## 2011-07-23 ENCOUNTER — Encounter (HOSPITAL_COMMUNITY): Payer: Self-pay

## 2011-07-23 ENCOUNTER — Other Ambulatory Visit (HOSPITAL_COMMUNITY): Payer: Self-pay | Admitting: Specialist

## 2011-07-23 LAB — CBC
HCT: 43.8 % (ref 39.0–52.0)
MCH: 32.5 pg (ref 26.0–34.0)
MCHC: 34.2 g/dL (ref 30.0–36.0)
MCV: 95 fL (ref 78.0–100.0)
Platelets: 222 10*3/uL (ref 150–400)
RDW: 13.6 % (ref 11.5–15.5)
WBC: 6.7 10*3/uL (ref 4.0–10.5)

## 2011-07-23 LAB — SURGICAL PCR SCREEN
MRSA, PCR: NEGATIVE
Staphylococcus aureus: NEGATIVE

## 2011-07-23 LAB — COMPREHENSIVE METABOLIC PANEL
AST: 17 U/L (ref 0–37)
Albumin: 3.8 g/dL (ref 3.5–5.2)
BUN: 12 mg/dL (ref 6–23)
Calcium: 9.6 mg/dL (ref 8.4–10.5)
Chloride: 104 mEq/L (ref 96–112)
Creatinine, Ser: 0.75 mg/dL (ref 0.50–1.35)
Total Bilirubin: 0.6 mg/dL (ref 0.3–1.2)
Total Protein: 6.7 g/dL (ref 6.0–8.3)

## 2011-07-23 NOTE — Progress Notes (Signed)
Requested orders from Dr. Barbaraann Faster office.

## 2011-07-23 NOTE — Pre-Procedure Instructions (Signed)
20 Carlos Olson  07/23/2011   Your procedure is scheduled on:  Tuesday, January 29  Report to [facility] at 5:30 AM.  Call this number if you have problems the morning of surgery: [phone]   Remember:   Do not eat food:After Midnight.  May have clear liquids: up to 4 Hours before arrival.  Clear liquids include soda, tea, black coffee, apple or grape juice, broth.  Take these medicines the morning of surgery with A SIP OF WATER: tylenol, spiriva   Do not wear jewelry, make-up or nail polish.  Do not wear lotions, powders, or perfumes. You may wear deodorant.  Do not shave 48 hours prior to surgery.  Do not bring valuables to the hospital.  Contacts, dentures or bridgework may not be worn into surgery.  Leave suitcase in the car. After surgery it may be brought to your room.  For patients admitted to the hospital, checkout time is 11:00 AM the day of discharge.   Patients discharged the day of surgery will not be allowed to drive home.  Name and phone number of your driver: NA  Special Instructions: CHG Shower Use Special Wash: 1/2 bottle night before surgery and 1/2 bottle morning of surgery.   Please read over the following fact sheets that you were given: Pain Booklet, Coughing and Deep Breathing and Surgical Site Infection Prevention

## 2011-07-28 MED ORDER — CEFAZOLIN SODIUM-DEXTROSE 2-3 GM-% IV SOLR
2.0000 g | INTRAVENOUS | Status: AC
  Administered 2011-07-29: 2 g via INTRAVENOUS
  Filled 2011-07-28: qty 50

## 2011-07-28 NOTE — H&P (Signed)
H and P reviewed.

## 2011-07-28 NOTE — H&P (Signed)
Carlos Olson is an 71 y.o. male.   Chief Complaint: neck pain left arm pain HPI: Three plus months of worsening neck pain and left upper extremity pain and weakness.  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  EMV/NCV studies with chronic radiculopathy.  No relief with conservative treatment. Admit for anterior cervical discectomy and fusion of C3-4 with allograft , plate and screws.  Past Medical History  Diagnosis Date  . History of prostate cancer   . COPD (chronic obstructive pulmonary disease)   . Diverticulosis of colon   . Pelvic fracture     Past Surgical History  Procedure Date  . Prostatectomy 08/1990  . Tonsillectomy   . Knee arthroscopy 09/1997    left  . Hernia repair     right 05/1999, left 09/04/05    Family History  Problem Relation Age of Onset  . Hypertension Mother   . Cancer Father     bone, prostate  . Diabetes Sister     x 40 years  . Cancer Brother     prostate   Social History:  reports that he quit smoking about 4 years ago. He has never used smokeless tobacco. He reports that he drinks about 7 ounces of alcohol per week. He reports that he does not use illicit drugs.  Allergies:  Allergies  Allergen Reactions  . Sulfonamide Derivatives     REACTION: itch    Medications Prior to Admission  Medication Dose Route Frequency Provider Last Rate Last Dose  . ceFAZolin (ANCEF) IVPB 2 g/50 mL premix  2 g Intravenous 60 min Pre-Op Kerrin Champagne, MD       Medications Prior to Admission  Medication Sig Dispense Refill  . acetaminophen (TYLENOL) 500 MG tablet Take 500 mg by mouth every 6 (six) hours as needed. For pain      . magnesium hydroxide (MILK OF MAGNESIA) 400 MG/5ML suspension Take 30 mLs by mouth daily as needed. For constipation      . tiotropium (SPIRIVA) 18 MCG inhalation capsule Place 18 mcg into inhaler and inhale daily.        No results found for this or any previous visit (from the past 48  hour(s)). No results found.  Review of Systems  Constitutional: Negative.   HENT: Positive for neck pain.   Eyes: Negative.   Respiratory: Negative.   Cardiovascular: Negative.   Gastrointestinal: Negative.   Genitourinary: Negative.   Skin: Negative.   Neurological: Positive for tingling.       Left arm  Endo/Heme/Allergies: Negative.   Psychiatric/Behavioral: Negative.     There were no vitals taken for this visit. Physical Exam  Constitutional: He is oriented to person, place, and time. He appears well-developed and well-nourished.  HENT:  Head: Normocephalic and atraumatic.  Eyes: EOM are normal. Pupils are equal, round, and reactive to light.  Neck:       Decreased ROM cspine with positive spurlings left  Cardiovascular: Normal rate, regular rhythm and normal heart sounds.   Respiratory: Effort normal and breath sounds normal.  GI: Soft. Bowel sounds are normal.  Musculoskeletal:       No focal weakness UEs LEs with distal pulses and sensation intact  Neurological: He is alert and oriented to person, place, and time.  Skin: Skin is warm and dry.  Psychiatric: He has a normal mood and affect.     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

## 2011-07-29 ENCOUNTER — Ambulatory Visit (HOSPITAL_COMMUNITY): Payer: Medicare Other | Admitting: Anesthesiology

## 2011-07-29 ENCOUNTER — Ambulatory Visit (HOSPITAL_COMMUNITY): Payer: Medicare Other

## 2011-07-29 ENCOUNTER — Inpatient Hospital Stay (HOSPITAL_COMMUNITY)
Admission: RE | Admit: 2011-07-29 | Discharge: 2011-07-30 | DRG: 472 | Disposition: A | Discharge status: Home / Self Care | Payer: Medicare Other | Source: Ambulatory Visit | Attending: Specialist | Admitting: Specialist

## 2011-07-29 ENCOUNTER — Encounter (HOSPITAL_COMMUNITY): Payer: Self-pay | Admitting: Anesthesiology

## 2011-07-29 ENCOUNTER — Encounter (HOSPITAL_COMMUNITY)
Admission: RE | Disposition: A | Discharge status: Home / Self Care | Payer: Self-pay | Source: Ambulatory Visit | Attending: Specialist

## 2011-07-29 ENCOUNTER — Encounter (HOSPITAL_COMMUNITY): Payer: Self-pay | Admitting: Specialist

## 2011-07-29 DIAGNOSIS — J449 Chronic obstructive pulmonary disease, unspecified: Secondary | ICD-10-CM | POA: Diagnosis present

## 2011-07-29 DIAGNOSIS — J029 Acute pharyngitis, unspecified: Secondary | ICD-10-CM | POA: Diagnosis present

## 2011-07-29 DIAGNOSIS — J4489 Other specified chronic obstructive pulmonary disease: Secondary | ICD-10-CM | POA: Diagnosis present

## 2011-07-29 DIAGNOSIS — M4802 Spinal stenosis, cervical region: Principal | ICD-10-CM | POA: Diagnosis present

## 2011-07-29 DIAGNOSIS — G959 Disease of spinal cord, unspecified: Secondary | ICD-10-CM | POA: Diagnosis present

## 2011-07-29 DIAGNOSIS — Z8546 Personal history of malignant neoplasm of prostate: Secondary | ICD-10-CM

## 2011-07-29 HISTORY — PX: ANTERIOR CERVICAL DECOMP/DISCECTOMY FUSION: SHX1161

## 2011-07-29 SURGERY — ANTERIOR CERVICAL DECOMPRESSION/DISCECTOMY FUSION 1 LEVEL
Anesthesia: General | Site: Spine Cervical | Wound class: Clean

## 2011-07-29 MED ORDER — PANTOPRAZOLE SODIUM 40 MG IV SOLR
40.0000 mg | Freq: Every day | INTRAVENOUS | Status: DC
  Administered 2011-07-29: 40 mg via INTRAVENOUS
  Filled 2011-07-29 (×2): qty 40

## 2011-07-29 MED ORDER — HYDROMORPHONE HCL PF 1 MG/ML IJ SOLN: 0.5000 mg | INTRAMUSCULAR | Status: DC | PRN

## 2011-07-29 MED ORDER — EPHEDRINE SULFATE 50 MG/ML IJ SOLN
INTRAMUSCULAR | Status: DC | PRN
  Administered 2011-07-29: 10 mg via INTRAVENOUS

## 2011-07-29 MED ORDER — SENNA 8.6 MG PO TABS
1.0000 | ORAL_TABLET | Freq: Two times a day (BID) | ORAL | Status: DC
  Administered 2011-07-29 – 2011-07-30 (×2): 8.6 mg via ORAL
  Filled 2011-07-29 (×4): qty 1

## 2011-07-29 MED ORDER — SODIUM CHLORIDE 0.9 % IJ SOLN: 3.0000 mL | INTRAMUSCULAR | Status: DC | PRN

## 2011-07-29 MED ORDER — MIDAZOLAM HCL 5 MG/5ML IJ SOLN
INTRAMUSCULAR | Status: DC | PRN
  Administered 2011-07-29: 2 mg via INTRAVENOUS

## 2011-07-29 MED ORDER — ONDANSETRON HCL 4 MG/2ML IJ SOLN: 4.0000 mg | INTRAMUSCULAR | Status: DC | PRN

## 2011-07-29 MED ORDER — PHENOL 1.4 % MT LIQD
1.0000 | OROMUCOSAL | Status: DC | PRN
  Filled 2011-07-29: qty 177

## 2011-07-29 MED ORDER — HYDROMORPHONE HCL PF 1 MG/ML IJ SOLN
INTRAMUSCULAR | Status: AC
  Filled 2011-07-29: qty 1

## 2011-07-29 MED ORDER — PHENYLEPHRINE HCL 10 MG/ML IJ SOLN
INTRAMUSCULAR | Status: DC | PRN
  Administered 2011-07-29 (×2): 80 ug via INTRAVENOUS

## 2011-07-29 MED ORDER — HEMOSTATIC AGENTS (NO CHARGE) OPTIME
TOPICAL | Status: DC | PRN
  Administered 2011-07-29: 1 via TOPICAL

## 2011-07-29 MED ORDER — KCL IN DEXTROSE-NACL 20-5-0.45 MEQ/L-%-% IV SOLN
INTRAVENOUS | Status: DC
  Administered 2011-07-29: 16:00:00 via INTRAVENOUS
  Filled 2011-07-29 (×2): qty 1000

## 2011-07-29 MED ORDER — MENTHOL 3 MG MT LOZG: 1.0000 | LOZENGE | OROMUCOSAL | Status: DC | PRN

## 2011-07-29 MED ORDER — BUPIVACAINE-EPINEPHRINE 0.5% -1:200000 IJ SOLN
INTRAMUSCULAR | Status: DC | PRN
  Administered 2011-07-29: 6 mL

## 2011-07-29 MED ORDER — VECURONIUM BROMIDE 10 MG IV SOLR
INTRAVENOUS | Status: DC | PRN
  Administered 2011-07-29: 2 mg via INTRAVENOUS
  Administered 2011-07-29: 1 mg via INTRAVENOUS

## 2011-07-29 MED ORDER — 0.9 % SODIUM CHLORIDE (POUR BTL) OPTIME
TOPICAL | Status: DC | PRN
  Administered 2011-07-29: 1000 mL

## 2011-07-29 MED ORDER — POLYETHYLENE GLYCOL 3350 17 G PO PACK
17.0000 g | PACK | Freq: Every day | ORAL | Status: DC | PRN
  Filled 2011-07-29: qty 1

## 2011-07-29 MED ORDER — ONDANSETRON HCL 4 MG/2ML IJ SOLN
INTRAMUSCULAR | Status: DC | PRN
  Administered 2011-07-29: 4 mg via INTRAVENOUS

## 2011-07-29 MED ORDER — KETOROLAC TROMETHAMINE 30 MG/ML IJ SOLN
30.0000 mg | Freq: Once | INTRAMUSCULAR | Status: AC
  Administered 2011-07-29: 30 mg via INTRAVENOUS

## 2011-07-29 MED ORDER — FLEET ENEMA 7-19 GM/118ML RE ENEM: 1.0000 | ENEMA | Freq: Once | RECTAL | Status: AC | PRN

## 2011-07-29 MED ORDER — ACETAMINOPHEN 10 MG/ML IV SOLN
INTRAVENOUS | Status: AC
  Filled 2011-07-29: qty 100

## 2011-07-29 MED ORDER — CEFAZOLIN SODIUM 1-5 GM-% IV SOLN
1.0000 g | Freq: Three times a day (TID) | INTRAVENOUS | Status: AC
  Administered 2011-07-29 (×2): 1 g via INTRAVENOUS
  Filled 2011-07-29 (×2): qty 50

## 2011-07-29 MED ORDER — THROMBIN 5000 UNITS EX SOLR
CUTANEOUS | Status: DC | PRN
  Administered 2011-07-29: 5000 [IU] via TOPICAL

## 2011-07-29 MED ORDER — CHLORHEXIDINE GLUCONATE 4 % EX LIQD: 60.0000 mL | Freq: Once | CUTANEOUS | Status: DC

## 2011-07-29 MED ORDER — ACETAMINOPHEN 325 MG PO TABS
650.0000 mg | ORAL_TABLET | ORAL | Status: DC | PRN
  Administered 2011-07-29: 650 mg via ORAL
  Filled 2011-07-29: qty 2

## 2011-07-29 MED ORDER — SODIUM CHLORIDE 0.9 % IV SOLN
250.0000 mL | INTRAVENOUS | Status: DC
  Administered 2011-07-29: 14:00:00 via INTRAVENOUS

## 2011-07-29 MED ORDER — HYDROMORPHONE HCL PF 1 MG/ML IJ SOLN
0.2500 mg | INTRAMUSCULAR | Status: DC | PRN
  Administered 2011-07-29: 0.25 mg via INTRAVENOUS

## 2011-07-29 MED ORDER — FENTANYL CITRATE 0.05 MG/ML IJ SOLN
INTRAMUSCULAR | Status: DC | PRN
  Administered 2011-07-29: 150 ug via INTRAVENOUS
  Administered 2011-07-29 (×2): 50 ug via INTRAVENOUS

## 2011-07-29 MED ORDER — SODIUM CHLORIDE 0.9 % IJ SOLN
3.0000 mL | Freq: Two times a day (BID) | INTRAMUSCULAR | Status: DC
  Administered 2011-07-29: 3 mL via INTRAVENOUS

## 2011-07-29 MED ORDER — NEOSTIGMINE METHYLSULFATE 1 MG/ML IJ SOLN
INTRAMUSCULAR | Status: DC | PRN
  Administered 2011-07-29: 3 mg via INTRAVENOUS

## 2011-07-29 MED ORDER — DEXAMETHASONE SODIUM PHOSPHATE 4 MG/ML IJ SOLN
INTRAMUSCULAR | Status: DC | PRN
  Administered 2011-07-29: 12 mg via INTRAVENOUS

## 2011-07-29 MED ORDER — GLYCOPYRROLATE 0.2 MG/ML IJ SOLN
INTRAMUSCULAR | Status: DC | PRN
  Administered 2011-07-29: .4 mg via INTRAVENOUS

## 2011-07-29 MED ORDER — HYDROCODONE-ACETAMINOPHEN 5-325 MG PO TABS
1.0000 | ORAL_TABLET | ORAL | Status: DC | PRN
  Administered 2011-07-29: 1 via ORAL
  Filled 2011-07-29: qty 2

## 2011-07-29 MED ORDER — OXYCODONE-ACETAMINOPHEN 5-325 MG PO TABS: 1.0000 | ORAL_TABLET | ORAL | Status: DC | PRN

## 2011-07-29 MED ORDER — ACETAMINOPHEN 650 MG RE SUPP: 650.0000 mg | RECTAL | Status: DC | PRN

## 2011-07-29 MED ORDER — ROCURONIUM BROMIDE 100 MG/10ML IV SOLN
INTRAVENOUS | Status: DC | PRN
  Administered 2011-07-29: 50 mg via INTRAVENOUS

## 2011-07-29 MED ORDER — LACTATED RINGERS IV SOLN
INTRAVENOUS | Status: DC | PRN
  Administered 2011-07-29 (×3): via INTRAVENOUS

## 2011-07-29 MED ORDER — ACETAMINOPHEN 10 MG/ML IV SOLN
INTRAVENOUS | Status: DC | PRN
  Administered 2011-07-29: 1000 mg via INTRAVENOUS

## 2011-07-29 MED ORDER — TIOTROPIUM BROMIDE MONOHYDRATE 18 MCG IN CAPS
18.0000 ug | ORAL_CAPSULE | Freq: Every day | RESPIRATORY_TRACT | Status: DC
  Filled 2011-07-29: qty 5

## 2011-07-29 MED ORDER — ONDANSETRON HCL 4 MG/2ML IJ SOLN: 4.0000 mg | Freq: Once | INTRAMUSCULAR | Status: DC | PRN

## 2011-07-29 MED ORDER — MAGNESIUM HYDROXIDE 400 MG/5ML PO SUSP: 30.0000 mL | Freq: Every day | ORAL | Status: DC | PRN

## 2011-07-29 MED ORDER — SORBITOL 70 % SOLN
30.0000 mL | Freq: Every day | Status: DC | PRN
  Filled 2011-07-29: qty 30

## 2011-07-29 MED ORDER — PROPOFOL 10 MG/ML IV EMUL
INTRAVENOUS | Status: DC | PRN
  Administered 2011-07-29: 150 mg via INTRAVENOUS
  Administered 2011-07-29: 60 mg via INTRAVENOUS

## 2011-07-29 SURGICAL SUPPLY — 73 items
ADH SKN CLS APL DERMABOND .7 (GAUZE/BANDAGES/DRESSINGS) ×1
BANDAGE ELASTIC 6 VELCRO ST LF (GAUZE/BANDAGES/DRESSINGS) IMPLANT
BIT DRILL SRG 14X2.2XFLT CHK (BIT) IMPLANT
BIT DRL SRG 14X2.2XFLT CHK (BIT) ×1
BLADE SURG ROTATE 9660 (MISCELLANEOUS) IMPLANT
BUR ROUND FLUTED 4 SOFT TCH (BURR) ×2 IMPLANT
BUR SABER RD CUTTING 3.0 (BURR) ×2 IMPLANT
CLOTH BEACON ORANGE TIMEOUT ST (SAFETY) ×2 IMPLANT
CORDS BIPOLAR (ELECTRODE) ×2 IMPLANT
COVER SURGICAL LIGHT HANDLE (MISCELLANEOUS) ×3 IMPLANT
DERMABOND ADVANCED (GAUZE/BANDAGES/DRESSINGS) ×1
DERMABOND ADVANCED .7 DNX12 (GAUZE/BANDAGES/DRESSINGS) ×1 IMPLANT
DISTRACTION PIN 14MM ×2 IMPLANT
DRAIN TLS ROUND 10FR (DRAIN) IMPLANT
DRAPE C-ARM 42X72 X-RAY (DRAPES) ×2 IMPLANT
DRAPE LAPAROTOMY T 102X78X121 (DRAPES) IMPLANT
DRAPE MICROSCOPE LEICA (MISCELLANEOUS) ×1 IMPLANT
DRAPE MICROSCOPE ZEISS OPMI (DRAPES) ×1 IMPLANT
DRAPE POUCH INSTRU U-SHP 10X18 (DRAPES) ×2 IMPLANT
DRAPE SURG 17X23 STRL (DRAPES) ×12 IMPLANT
DRILL BIT SKYLINE 14MM (BIT) ×2
DRSG MEPILEX BORDER 4X4 (GAUZE/BANDAGES/DRESSINGS) ×1 IMPLANT
DRSG MEPILEX BORDER 4X8 (GAUZE/BANDAGES/DRESSINGS) IMPLANT
DURAPREP 6ML APPLICATOR 50/CS (WOUND CARE) ×2 IMPLANT
ELECT BLADE 4.0 EZ CLEAN MEGAD (MISCELLANEOUS) ×2
ELECT COATED BLADE 2.86 ST (ELECTRODE) ×2 IMPLANT
ELECT REM PT RETURN 9FT ADLT (ELECTROSURGICAL) ×2
ELECTRODE BLDE 4.0 EZ CLN MEGD (MISCELLANEOUS) ×1 IMPLANT
ELECTRODE REM PT RTRN 9FT ADLT (ELECTROSURGICAL) ×1 IMPLANT
GLOVE BIOGEL PI IND STRL 7.5 (GLOVE) ×1 IMPLANT
GLOVE BIOGEL PI INDICATOR 7.5 (GLOVE) ×1
GLOVE ECLIPSE 7.0 STRL STRAW (GLOVE) ×3 IMPLANT
GLOVE ECLIPSE 8.5 STRL (GLOVE) ×3 IMPLANT
GLOVE SURG 8.5 LATEX PF (GLOVE) ×2 IMPLANT
GOWN PREVENTION PLUS LG XLONG (DISPOSABLE) ×1 IMPLANT
GOWN PREVENTION PLUS XXLARGE (GOWN DISPOSABLE) ×2 IMPLANT
GOWN STRL NON-REIN LRG LVL3 (GOWN DISPOSABLE) ×3 IMPLANT
HEAD HALTER (SOFTGOODS) ×2 IMPLANT
KIT BASIN OR (CUSTOM PROCEDURE TRAY) ×2 IMPLANT
KIT ROOM TURNOVER OR (KITS) ×2 IMPLANT
MANIFOLD NEPTUNE II (INSTRUMENTS) ×2 IMPLANT
NDL SPNL 20GX3.5 QUINCKE YW (NEEDLE) ×2 IMPLANT
NEEDLE SPNL 20GX3.5 QUINCKE YW (NEEDLE) ×2 IMPLANT
NS IRRIG 1000ML POUR BTL (IV SOLUTION) ×2 IMPLANT
PACK ORTHO CERVICAL (CUSTOM PROCEDURE TRAY) ×2 IMPLANT
PAD ARMBOARD 7.5X6 YLW CONV (MISCELLANEOUS) ×4 IMPLANT
PATTIES SURGICAL .5 X.5 (GAUZE/BANDAGES/DRESSINGS) IMPLANT
PIN DISTRACTION 16MM (PIN) ×2 IMPLANT
PIN DISTRACTOR STERILE 14MM (PIN) ×2 IMPLANT
PLATE SKYLINE ONE LEVEL 18MM (Plate) ×1 IMPLANT
SCREW VAR SELF TAP SKYLINE 14M (Screw) ×4 IMPLANT
SPACER ACF ADV PARALLEL 9MM (Spacer) ×1 IMPLANT
SPONGE INTESTINAL PEANUT (DISPOSABLE) ×1 IMPLANT
SPONGE LAP 4X18 X RAY DECT (DISPOSABLE) IMPLANT
SPONGE SURGIFOAM ABS GEL 100 (HEMOSTASIS) ×1 IMPLANT
SUT ETHILON 4 0 PS 2 18 (SUTURE) ×1 IMPLANT
SUT PDS AB 4-0 P3 18 (SUTURE) ×1 IMPLANT
SUT VIC AB 0 CT1 27 (SUTURE) ×2
SUT VIC AB 0 CT1 27XBRD ANBCTR (SUTURE) ×2 IMPLANT
SUT VIC AB 1 CT1 27 (SUTURE)
SUT VIC AB 1 CT1 27XBRD ANBCTR (SUTURE) IMPLANT
SUT VIC AB 2-0 CT1 27 (SUTURE) ×2
SUT VIC AB 2-0 CT1 TAPERPNT 27 (SUTURE) ×1 IMPLANT
SUT VIC AB 3-0 X1 27 (SUTURE) ×1 IMPLANT
SUT VICRYL 0 UR6 27IN ABS (SUTURE) ×2 IMPLANT
SUT VICRYL 4-0 PS2 18IN ABS (SUTURE) ×2 IMPLANT
SYR 30ML LL (SYRINGE) ×2 IMPLANT
SYR 30ML SLIP (SYRINGE) ×1 IMPLANT
SYSTEM CHEST DRAIN TLS 7FR (DRAIN) ×1 IMPLANT
TOWEL OR 17X24 6PK STRL BLUE (TOWEL DISPOSABLE) ×2 IMPLANT
TOWEL OR 17X26 10 PK STRL BLUE (TOWEL DISPOSABLE) ×2 IMPLANT
TRAY FOLEY CATH 14FR (SET/KITS/TRAYS/PACK) IMPLANT
WATER STERILE IRR 1000ML POUR (IV SOLUTION) ×2 IMPLANT

## 2011-07-29 NOTE — Op Note (Addendum)
07/29/2011  10:29 AM  PATIENT:  Carlos Olson  71 y.o. 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 [employer] ACF 9 MM AND LOCAL BONE GRAFT. OR MICROSCOPE USED FOR THIS PROCEDURE.    SURGEON:  Kerrin Champagne, MD     ASSISTANT:  Maud Deed, PA-C  (Present throughout the entire procedure     and necessary for completion of procedure in a timely manner)     ANESTHESIA:  General,    COMPLICATIONS:  None.     COMPONENTS: 18 mm Depuy Skyline cervical locking plate with 14 mm screws. 9mm [employer] ACF Advanced Parallel Allograft.  PROCEDURE: The patient was met in the holding area, and the appropriate cervical C3-4 identified and marked with an "X" and my initials.The patient was then transported to OR and was placed on the operative table in a supine position. The patient was then placed under  general anesthesia without difficulty. The patient received appropriate preoperative antibiotic prophylaxis.Time-out procedure was called and correct. Cervical spine was positioned with a [place] horseshoe and 5 pound cervical halter traction. All pressure points well padded and semi-beach chair position. Arm holder both arms. Standard prep with DuraPrep solution the anterior cervical spine chest. Draped in the usual manner. Iodine vi drape was used. Standard timeout protocol was carried out identifying the patient procedure side of the procedure and level. The skin the left neck was infiltrated with Marcaine with epinephrine total of 10 cc. This at the level of  expected C3-4 incision.. Incision transverse at the C3-4 level and carried down to the level of the platysma. And the incision over the lower cervical spine through the previous old incision scar was carried down to the anterior aspect of the sternocleidomastoid muscle. The interval between the trachea and esophagus medially and the carotid sheath laterally was developed as a Metzenbaum scissors and blunt dissection exposing the anterior aspect of cervical spine at the C3-4 level. At the C3-4 level  a 18-gauge spinal needle was placed with sheath intact allowing only a centimeter to true into the C3_4 to and observed on lateral cervical radiograph at the C3-4 level. Handheld Cloward retraction of the soft tissues while identifying the level at C3-4 and also while removing a portion spur from the anterior aspect of the disc with 2 mm Kerrison.  Medial border of the longus collie muscles was carefully elevated bilaterally and self-retaining retractors were introduced the foot of the blade beneath the medial border of the longus colli muscles. Soft tissue overlying the anterior borders of the disc space at C3-4 level carefully debridement of soft tissue back to bony edges. 14 mm screw posts were inserted into the anterior aspect of C3 and C4. Distraction of the disc space was performed. The anterior lip osteophytes were then resected using rongeur. This bone was preserved for later bone grafting purposes. The disc space was then first prepared using loupe magnification and headlight with resection of degenerative disc annulus anteriorly and posteriorly and cartilaginous endplates using micro-curettes pituitaries and a high-speed bur. The operating room microscope was draped sterilely and brought into the field the C-arm previously had been draped sterilely prior to its use. Under the operating  room microscope and posterior aspect of the disc was excised using micro-curettes, pituitary rongeur and Kerrisons. Posterior  lip osteophytes were drilled using a high-speed bur and a carefully resected with 1 and 2 mm Kerrison foraminotomy performed over both C4 nerve roots using 1 and 2 mm Kerrisons disc herniation material was noted centrally and into both foramen some of which represented reflected portion of the patient's annulus into the neuroforamen. Following decompression of the spinal cord at both C4 nerve root irrigation was carried out. The endplates of the inferior aspect of C3 and superior aspect of C4 were carefully prepared using a high-speed bur to parallel. The disc space was then sounded utilized and a precontoured lordotic sounder for the Depuy M-S ACF allograft. Sounding was carried up to a 9 mm implant.  9 mm lordotic spacer was felt to provide best fit for the C3-4 disc space. The Depuy Synthes permanent M-S ACF parallel implant was then brought onto the field. It was then packed with local bone graft that had been harvested previously. The implant was then carefully placed over the intervertebral disc space at C3-4 level. Care taken to ensure that no bone or soft tissue debris within the disc space that could be retropulsed with insertion of the cage and bone graft. The implant was then impacted into place with the head placed in longitudinal cervical traction. The implant was felt to be in excellent position and alignment was accepted.Measuring with a cottonoid string a 18 mm length plate was chosen. A 14 mm drill introduced into the anterior aspect of the left side of the plate directed slightly cranial and medial a 14mm screw was then placed fixing the left side of the plate to the C3 vertebra body.Longitudinal traction was released on the cervical spine.The left side screw at C4 was then placed by first using 14mm drill introduced into the anterior aspect of the right side of the plate directed slightly  medial.14 mm screw was chosen for the right side and placed within the hole that was made. The drill holes  and  Screws each 14 mm in length were placed at the C3 and C4 levels on the right side. The locking mechanism then engaged for each of the screws locking them to the plate with the specialized torque screwdriver. Permanent images were obtained documenting the screw and plate placement using lateral radiograph. This point irrigation was carried out both the upper cervical incision site. The patient was given 12 mg of Decadron IV intraoperatively. Microscope was used to exam the esophagus at the C3-4 cervical level  and found to be normal. Irrigation was again carried out there was no active bleeding present. A 7 french TLS drain was place down to the level of the plate exiting out the skin inferior to the incision and sewn in place with 4-0 nylon suture.The incision was then closed by approximating the deep subcutaneous layers the platysma layer with interrupted 3-0 Vicryl suture and the superficial fascia overlying the sternocleidomastoid muscle with interrupted 3-0 Vicryl sutures. The subcutaneous layers were approximated with interrupted 3-0 Vicryl sutures as were the superficial layers. The skin was closed with a running subcutaneous stitch of 4-0 Vicryl both to the operative C3-4 transverse incisions. Skin was approximated with Dermabond. Mepilex bandage was applied. A Philadelphia collar was then applied to the cervical spine. Longitudinal traction was released on the cervical spine and drapes were removed. Patient's or table was then returned to the flat position. A hand table was then applied  to the right side of the table for the right-sided carpal tunnel release surgery. At the end of the  case all instrument and sponge counts were correct.  Maud Deed PA-C performed the duties of assistant surgeon during this case She was present from the beginning of the surgical case to the end of the case. She assisted in positioning of the patient on the OR table. Performed careful  Retraction of delicate soft  tissues of the cervical spine, esophagus and trachea. Under the surgical microscope she performed careful suctioning of the delicate Neural structures including the cervical cord and assisted in the removal of  degenerative disc material while it was debrided at the disc space.She performed retraction while plate and screws were placed and at the end of the procedure performed closure of the incision from Deep layers to the skin and application of the dressing and philadelphia collar.     Orvie Caradine E  07/29/2011, 10:29 AM

## 2011-07-29 NOTE — Anesthesia Postprocedure Evaluation (Signed)
Anesthesia Post Note  Patient: Carlos Olson  Procedure(s) Performed:  ANTERIOR CERVICAL DECOMPRESSION/DISCECTOMY FUSION 1 LEVEL - Anterior Cervical Discectomy and Fusion C3-4  Anesthesia type: General  Patient location: PACU  Post pain: Pain level controlled and Adequate analgesia  Post assessment: Post-op Vital signs reviewed, Patient's Cardiovascular Status Stable, Respiratory Function Stable, Patent Airway and Pain level controlled  Last Vitals:  Filed Vitals:   07/29/11 1130  BP:   Pulse: 64  Temp:   Resp: 15    Post vital signs: Reviewed and stable  Level of consciousness: awake, alert  and oriented  Complications: No apparent anesthesia complications

## 2011-07-29 NOTE — Transfer of Care (Signed)
Immediate Anesthesia Transfer of Care Note  Patient: Carlos Olson  Procedure(s) Performed:  ANTERIOR CERVICAL DECOMPRESSION/DISCECTOMY FUSION 1 LEVEL - Anterior Cervical Discectomy and Fusion C3-4  Patient Location: PACU  Anesthesia Type: General  Level of Consciousness: awake, alert , oriented and patient cooperative  Airway & Oxygen Therapy: Patient Spontanous Breathing and Patient connected to nasal cannula oxygen  Post-op Assessment: Report given to PACU RN, Post -op Vital signs reviewed and stable and Patient moving all extremities  Post vital signs: Reviewed and stable  Complications: No apparent anesthesia complications

## 2011-07-29 NOTE — H&P (Signed)
The patient has been re-examined, and the chart reviewed, and there have been no interval changes to the documented history and physical.    The risks, benefits, and alternatives have been discussed at length, and the patient is willing to proceed.   

## 2011-07-29 NOTE — Brief Op Note (Signed)
07/29/2011  10:09 AM  PATIENT:  Carlos Olson  71 y.o. 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 ORIF with 18 mm Depuy Skyline plate with 4 x 14mm self tapping variable screws  SURGEON:  Surgeon(s): Kerrin Champagne, MD  PHYSICIAN ASSISTANT: Maud Deed PA-C  ANESTHESIA:   local and general  EBL:  Total I/O In: 2000 [I.V.:2000] Out: -   BLOOD ADMINISTERED:none  DRAINS: (7 fr) TLS Drain(s) to suction in the Anterior cervical spine.   LOCAL MEDICATIONS USED:  MARCAINE 10CC  SPECIMEN:  No Specimen  DISPOSITION OF SPECIMEN:  N/A  COUNTS:  YES  TOURNIQUET:  * No tourniquets in log *  DICTATION: .Dragon Dictation  PLAN OF CARE: Admit for overnight observation  PATIENT DISPOSITION:  PACU - hemodynamically stable.   Delay start of Pharmacological VTE agent (>24hrs) due to surgical blood loss or risk of bleeding:  YES.

## 2011-07-29 NOTE — Anesthesia Preprocedure Evaluation (Addendum)
Anesthesia Evaluation  Patient identified by MRN, date of birth, ID band Patient awake    Reviewed: Allergy & Precautions, H&P , NPO status , Patient's Chart, lab work & pertinent test results, reviewed documented beta blocker date and time   Airway Mallampati: II TM Distance: >3 FB Neck ROM: Limited    Dental  (+) Upper Dentures   Pulmonary COPD COPD inhaler,  clear to auscultation  Pulmonary exam normal       Cardiovascular neg cardio ROS Regular Normal- Systolic murmurs    Neuro/Psych    GI/Hepatic negative GI ROS, Neg liver ROS,   Endo/Other  Negative Endocrine ROS  Renal/GU negative Renal ROS     Musculoskeletal   Abdominal   Peds  Hematology   Anesthesia Other Findings   Reproductive/Obstetrics                          Anesthesia Physical Anesthesia Plan  ASA: III  Anesthesia Plan: General   Post-op Pain Management:    Induction: Intravenous  Airway Management Planned: Oral ETT  Additional Equipment:   Intra-op Plan:   Post-operative Plan: Extubation in OR  Informed Consent: I have reviewed the patients History and Physical, chart, labs and discussed the procedure including the risks, benefits and alternatives for the proposed anesthesia with the patient or authorized representative who has indicated his/her understanding and acceptance.     Plan Discussed with: CRNA, Anesthesiologist and Surgeon  Anesthesia Plan Comments:         Anesthesia Quick Evaluation

## 2011-07-29 NOTE — Preoperative (Signed)
Beta Blockers   Reason not to administer Beta Blockers:Not Applicable 

## 2011-07-30 ENCOUNTER — Encounter (HOSPITAL_COMMUNITY): Payer: Self-pay | Admitting: Specialist

## 2011-07-30 MED ORDER — HYDROCODONE-ACETAMINOPHEN 5-325 MG PO TABS: 1.0000 | ORAL_TABLET | ORAL | Status: AC | PRN

## 2011-07-30 MED ORDER — METHOCARBAMOL 500 MG PO TABS: 500.0000 mg | ORAL_TABLET | Freq: Four times a day (QID) | ORAL | Status: DC | PRN

## 2011-07-30 MED ORDER — METHOCARBAMOL 500 MG PO TABS: 500.0000 mg | ORAL_TABLET | Freq: Four times a day (QID) | ORAL | Status: AC | PRN

## 2011-07-30 NOTE — Progress Notes (Signed)
UR COMPLETED  

## 2011-07-30 NOTE — Discharge Summary (Signed)
Patient seen with Maud Deed PA-C examined and note reviewed. jen

## 2011-07-30 NOTE — Progress Notes (Signed)
Physical Therapy Evaluation Patient Details Name: Carlos Olson MRN: [id number] DOB: 1940-08-31 Today's Date: 07/30/2011  Problem List:  Patient Active Problem List  Diagnoses  . NEOP, MALIGNANT, PROSTATE  . HYPERLIPIDEMIA  . History of tobacco abuse  . COPD  . DIVERTICULOSIS, COLON  . SHOULDER PAIN, RIGHT  . BACK PAIN, CHRONIC  . PROSTATE CANCER, HX OF  . Preop examination  . Cervical stenosis of spinal canal    Past Medical History:  Past Medical History  Diagnosis Date  . History of prostate cancer   . COPD (chronic obstructive pulmonary disease)   . Diverticulosis of colon   . Pelvic fracture    Past Surgical History:  Past Surgical History  Procedure Date  . Prostatectomy 08/1990  . Tonsillectomy   . Knee arthroscopy 09/1997    left  . Hernia repair     right 05/1999, left 09/04/05    PT Assessment/Plan/Recommendation PT Assessment Clinical Impression Statement: 71 yo male s/p ACDF presents without gross functional defecits -- ambulating and transferrring well with good adherence to c-spine precautions; ok for dc home today from PT standpoint; May benefit from Outpatient PT services in the future to address on-going back pain and his slight gait assymetries PT Recommendation/Assessment: All further PT needs can be met in the next venue of care PT Problem List: Pain (gait assymetries) PT Therapy Diagnosis : Acute pain PT Recommendation Follow Up Recommendations: Outpatient PT (This can be addressed at Ortho follow-up) Equipment Recommended: None recommended by PT     PT Evaluation Precautions/Restrictions  Precautions Precautions: Other (comment) (Soft collar/C-Spine prec) Precaution Comments: No lifting, or overhaead UE use Required Braces or Orthoses: Yes Cervical Brace: Soft collar Prior Functioning  Home Living Lives With: Spouse Receives Help From: Family Type of Home: House Home Layout: One level Home Access: Stairs to enter Entrance  Stairs-Rails: Right Entrance Stairs-Number of Steps: 7 Prior Function Level of Independence: Independent with basic ADLs;Independent with homemaking with ambulation Able to Take Stairs?: Yes Driving: Yes Cognition Cognition Arousal/Alertness: Awake/alert Overall Cognitive Status: Appears within functional limits for tasks assessed Orientation Level: Oriented X4 Sensation/Coordination Sensation Light Touch: Appears Intact Coordination Gross Motor Movements are Fluid and Coordinated: Yes Fine Motor Movements are Fluid and Coordinated: Yes Extremity Assessment RUE Assessment RUE Assessment: Within Functional Limits LUE Assessment LUE Assessment: Within Functional Limits RLE Assessment RLE Assessment: Within Functional Limits LLE Assessment LLE Assessment: Within Functional Limits Mobility (including Balance) Bed Mobility Bed Mobility: No (Pt reports no problems with in/out of bed) Transfers Transfers: Yes Sit to Stand: 6: Modified independent (Device/Increase time);Without upper extremity assist Stand to Sit: 6: Modified independent (Device/Increase time);Without upper extremity assist Ambulation/Gait Ambulation/Gait: Yes Ambulation/Gait Assistance: 6: Modified independent (Device/Increase time) Ambulation Distance (Feet): 150 Feet Assistive device: None Gait Pattern: Within Functional Limits (some minor assymetries, no loss of balance) Stairs: Yes Stairs Assistance: 6: Modified independent (Device/Increase time) Stair Management Technique: No rails;Forwards (with reciprocal stepping) Number of Stairs: 5        End of Session PT - End of Session Equipment Utilized During Treatment: Cervical collar Activity Tolerance: Patient tolerated treatment well Patient left: in chair;with family/visitor present General Behavior During Session: [facility] for tasks performed Cognition: [facility] for tasks performed  [facility] [place], [place] [phone]  07/30/2011, 10:00  AM

## 2011-07-30 NOTE — Discharge Summary (Signed)
Physician Discharge Summary  Patient ID: Carlos Olson MRN: [id number] DOB/AGE: Oct 21, 1940 71 y.o.  Admit date: 07/29/2011 Discharge date: 07/30/2011  Admission Diagnoses:  Cervical stenosis of spinal canal AT C3-4  Discharge Diagnoses:  Principal Problem:  *Cervical stenosis of spinal canal C3-4  Past Medical History  Diagnosis Date  . History of prostate cancer   . COPD (chronic obstructive pulmonary disease)   . Diverticulosis of colon   . Pelvic fracture     Surgeries: Procedure(s): ANTERIOR CERVICAL DECOMPRESSION/DISCECTOMY FUSION 1 LEVEL on 07/29/2011  C3-4 WITH ALLOGRAFT PLATE AND SCREWS Consultants (if any):  NONE  Discharged Condition: Improved  Hospital Course: RANKIN COOLMAN is an 71 y.o. male who was admitted 07/29/2011 with a diagnosis of Cervical stenosis of spinal canal and went to the operating room on 07/29/2011 and underwent the above named procedures.    He was given perioperative antibiotics:  Anti-infectives     Start     Dose/Rate Route Frequency Ordered Stop   07/29/11 1615   ceFAZolin (ANCEF) IVPB 1 g/50 mL premix        1 g 100 mL/hr over 30 Minutes Intravenous Every 8 hours 07/29/11 1243 07/29/11 2355   07/28/11 1515   ceFAZolin (ANCEF) IVPB 2 g/50 mL premix        2 g 100 mL/hr over 30 Minutes Intravenous 60 min pre-op 07/28/11 1514 07/29/11 0818        .  He was given sequential compression devices, early ambulation, and chemoprophylaxis for DVT prophylaxis.  He benefited maximally from their hospital stay and there were no complications.    Recent vital signs:  Filed Vitals:   07/30/11 0609  BP: 128/71  Pulse: 62  Temp: 97.2 F (36.2 C)  Resp: 18    Recent laboratory studies:  Lab Results  Component Value Date   HGB 15.0 07/23/2011   HGB 15.1 05/08/2011   HGB 15.8 04/13/2009   Lab Results  Component Value Date   WBC 6.7 07/23/2011   PLT 222 07/23/2011   No results found for this basename: INR   Lab Results  Component  Value Date   NA 139 07/23/2011   K 4.4 07/23/2011   CL 104 07/23/2011   CO2 27 07/23/2011   BUN 12 07/23/2011   CREATININE 0.75 07/23/2011   GLUCOSE 100* 07/23/2011    Discharge Medications:   Medication List  As of 07/30/2011  8:44 AM   TAKE these medications         acetaminophen 500 MG tablet   Commonly known as: TYLENOL   Take 500 mg by mouth every 6 (six) hours as needed. For pain      HYDROcodone-acetaminophen 5-325 MG per tablet   Commonly known as: NORCO   Take 1-2 tablets by mouth every 4 (four) hours as needed.      magnesium hydroxide 400 MG/5ML suspension   Commonly known as: MILK OF MAGNESIA   Take 30 mLs by mouth daily as needed. For constipation      methocarbamol 500 MG tablet   Commonly known as: ROBAXIN   Take 1 tablet (500 mg total) by mouth every 6 (six) hours as needed.      tiotropium 18 MCG inhalation capsule   Commonly known as: SPIRIVA   Place 18 mcg into inhaler and inhale daily.            Diagnostic Studies: Dg Chest 2 View  07/17/2011  *RADIOLOGY REPORT*  Clinical Data: COPD, preop evaluation  CHEST - 2 VIEW  Comparison: None.  Findings: Lungs are hyperinflated.  Heart silhouette is normal. Lateral projection demonstrates degenerative spurring of the spine. No pleural fluid.  No pneumothorax.  No focal consolidation.  IMPRESSION:  1.  Hyperinflated lungs consistent with emphysema. 2.  Degenerative change of the spine. 3.  No acute findings.  Original Report Authenticated By: Genevive Bi, M.D.   Dg Cervical Spine 2-3 Views  07/29/2011  *RADIOLOGY REPORT*  Clinical Data: C3-C4 ACDF.  CERVICAL SPINE - 2-3 VIEW  Comparison: Cervical MRI 07/08/2011.  Findings: Two cross-table lateral views of the cervical spine are submitted postoperatively from the operating room.  Image #1 at 0840 hours demonstrates anterior localization of the C3-C4 disc space with a needle.  Image #2 at 1000 hours demonstrates interval anterior discectomy and fusion at C3-C4 with an  anterior plate, screws and intervertebral bone plug.  Hardware appears well positioned.  No complications are identified.  IMPRESSION: No demonstrated complication following C3-C4 ACDF.  Original Report Authenticated By: Gerrianne Scale, M.D.    Disposition: Final discharge disposition not confirmed STABLE  Discharge Orders    Future Orders Please Complete By Expires   Diet - low sodium heart healthy      Call MD / Call 911      Comments:   If you experience chest pain or shortness of breath, CALL 911 and be transported to the hospital emergency room.  If you develope a fever above 101 F, pus (white drainage) or increased drainage or redness at the wound, or calf pain, call your surgeon's office.   Constipation Prevention      Comments:   Drink plenty of fluids.  Prune juice may be helpful.  You may use a stool softener, such as Colace (over the counter) 100 mg twice a day.  Use MiraLax (over the counter) for constipation as needed.   Increase activity slowly as tolerated      Discharge instructions      Comments:   Wear soft collar at all times.  Use [place] collar for showering.  Change dressing daily or as needed. No overhead use of arms.  No lifting more than 5 pounds. Ice packs may be used as needed to neck.  Soft diet if sore throat.  Call office if questions   Driving restrictions      Comments:   No driving   Lifting restrictions      Comments:   No lifting      RX GIVEN FOR HYDROCODONE AND ROBAXIN  Follow-up Information    Follow up with NITKA,JAMES E, MD. Schedule an appointment as soon as possible for a visit in 2 weeks.   Contact information:   [employer] [address] [place] [address] [phone]           Signed: Wende Neighbors 07/30/2011, 8:44 AM

## 2011-07-30 NOTE — Progress Notes (Signed)
CARE MANAGEMENT NOTE 07/30/2011   No Home Health discharge needs identified          

## 2011-07-30 NOTE — Progress Notes (Signed)
Subjective: Comfortable.  Slight sore throat Collar uncomfortable, but he doesn't have the soft collar yet that he will wear full time. Ambulating in the room   Objective: Vital signs in last 24 hours: Temp:  [97.1 F (36.2 C)-98.1 F (36.7 C)] 97.2 F (36.2 C) (01/30 0609) Pulse Rate:  [62-79] 62  (01/30 0609) Resp:  [15-18] 18  (01/30 0609) BP: (113-134)/(62-80) 128/71 mmHg (01/30 0609) SpO2:  [90 %-96 %] 92 % (01/30 0609) Weight:  [92.987 kg (205 lb)] 92.987 kg (205 lb) (01/29 1406)  Intake/Output from previous day: 01/29 0701 - 01/30 0700 In: 3151 [P.O.:400; I.V.:2700; IV Piggyback:50] Out: 121 [Drains:21; Blood:100] Intake/Output this shift:    No results found for this basename: HGB:5 in the last 72 hours No results found for this basename: WBC:2,RBC:2,HCT:2,PLT:2 in the last 72 hours No results found for this basename: NA:2,K:2,CL:2,CO2:2,BUN:2,CREATININE:2,GLUCOSE:2,CALCIUM:2 in the last 72 hours No results found for this basename: LABPT:2,INR:2 in the last 72 hours  Neurovascular intact Sensation intact distally Intact pulses distally Incision: scant drainage Drainage from drain site after removal of drain.  Serous only.  Assessment/Plan: Discharge home today after he receives soft collar. OV 2weeks rx vicodin and robaxin Collar at all times philly collar for shower COD stable   Carlos Olson M 07/30/2011, 8:28 AM

## 2011-10-06 ENCOUNTER — Ambulatory Visit: Payer: Self-pay

## 2011-11-14 ENCOUNTER — Ambulatory Visit: Payer: Self-pay | Admitting: General Practice

## 2012-06-15 ENCOUNTER — Other Ambulatory Visit: Payer: Self-pay | Admitting: Family Medicine

## 2012-06-15 DIAGNOSIS — E785 Hyperlipidemia, unspecified: Secondary | ICD-10-CM

## 2012-06-15 DIAGNOSIS — Z8546 Personal history of malignant neoplasm of prostate: Secondary | ICD-10-CM

## 2012-06-16 ENCOUNTER — Other Ambulatory Visit (INDEPENDENT_AMBULATORY_CARE_PROVIDER_SITE_OTHER): Payer: Medicare Other

## 2012-06-16 DIAGNOSIS — E785 Hyperlipidemia, unspecified: Secondary | ICD-10-CM

## 2012-06-16 DIAGNOSIS — Z8546 Personal history of malignant neoplasm of prostate: Secondary | ICD-10-CM

## 2012-06-16 LAB — BASIC METABOLIC PANEL
CO2: 28 mEq/L (ref 19–32)
Chloride: 105 mEq/L (ref 96–112)
Potassium: 5.1 mEq/L (ref 3.5–5.1)
Sodium: 140 mEq/L (ref 135–145)

## 2012-06-16 LAB — LIPID PANEL: Total CHOL/HDL Ratio: 4

## 2012-06-22 ENCOUNTER — Encounter: Payer: Self-pay | Admitting: Family Medicine

## 2012-06-22 ENCOUNTER — Ambulatory Visit (INDEPENDENT_AMBULATORY_CARE_PROVIDER_SITE_OTHER): Payer: Medicare Other | Admitting: Family Medicine

## 2012-06-22 VITALS — BP 110/70 | HR 80 | Temp 97.5°F | Ht 75.0 in | Wt 210.2 lb

## 2012-06-22 DIAGNOSIS — J449 Chronic obstructive pulmonary disease, unspecified: Secondary | ICD-10-CM

## 2012-06-22 DIAGNOSIS — Z23 Encounter for immunization: Secondary | ICD-10-CM

## 2012-06-22 DIAGNOSIS — Z0000 Encounter for general adult medical examination without abnormal findings: Secondary | ICD-10-CM | POA: Insufficient documentation

## 2012-06-22 DIAGNOSIS — Z8546 Personal history of malignant neoplasm of prostate: Secondary | ICD-10-CM

## 2012-06-22 NOTE — Progress Notes (Signed)
Subjective:    Patient ID: Carlos Olson, male    DOB: May 10, 1941, 71 y.o.   MRN: [id number]  HPI CC: medicare wellness visit  Pleasant 71 yo with h/o prostate cancer s/p prostatectomy, HLD, COPD well controlled on spiriva, former smoker (quit 2008) presents for medicare wellness visit.  No concerns today.  15 lb weight loss in last 3 weeks from cutting back on sugar intake.  Recent bronchitis, treated at walk in clinic w/ augmentin, improving. COPD - told to wean off of spiriva by prior PCP.  Walks up a flight of stairs well.  100 PY hx.  Planning on weaning self off spiriva.  This year had successful ACDF. Had fall in March with meniscal injury s/p arthroscopy and then simvis injections.  To see Dr. Ernest Pine 07/28/2011 for discussion R knee replacement.  Taking 6-8 tylenol daily for knee pain.    Hearing screen failed - does have hearing aides but doesn't use (uncomfortable and doesn't help). Vision screen passed.  Preventative: Flu shot 2013 Td 2010 Pneumovax 2002.  Will repeat today. zostavax 2012 Colon 12/2007 - Medhoff, diverticulosis and polyp, rpt 5 yrs Prostate cancer - s/p prostatectomy 1990s.   Advanced directives: has at home.  Asked him to bring [place] a copy.  Wife is HCPOA  Lives with wife.  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  Past histories reviewed and updated if relevant as below. Patient Active Problem List  Diagnosis  . NEOP, MALIGNANT, PROSTATE  . HYPERLIPIDEMIA  . History of tobacco abuse  . COPD  . DIVERTICULOSIS, COLON  . SHOULDER PAIN, RIGHT  . BACK PAIN, CHRONIC  . PROSTATE CANCER, HX OF  . Preop examination  . Cervical stenosis of spinal canal   Past Medical History  Diagnosis Date  . History of prostate cancer   . COPD (chronic obstructive pulmonary disease)   . Diverticulosis of  colon   . Pelvic fracture   . IBS (irritable bowel syndrome)     improved after retirement  . Colon polyps    Past Surgical History  Procedure Date  . Prostatectomy 08/1990  . Tonsillectomy   . Knee arthroscopy 09/1997    left  . Hernia repair     right 05/1999, left 09/04/05  . Anterior cervical decomp/discectomy fusion 07/29/2011    Procedure: ANTERIOR CERVICAL DECOMPRESSION/DISCECTOMY FUSION 1 LEVEL;  Surgeon: Kerrin Champagne, MD;  Location: [facility] OR;  Service: Orthopedics;  Laterality: N/A;  Anterior Cervical Discectomy and Fusion C3-4  . Colonoscopy 12/2007    Medhoff, diverticulosis and polyp, rpt 5 yrs   History  Substance Use Topics  . Smoking status: Former Smoker    Quit date: 05/15/2007  . Smokeless tobacco: Never Used     Comment: quit 06/16/2007  . Alcohol Use: 7.0 oz/week    14 drink(s) per week     Comment: 2 drinks a day   Family History  Problem Relation Age of Onset  . Hypertension Mother   . Cancer Father     bone, prostate  . Diabetes Sister     x 40 years  . Cancer Brother     prostate   Allergies  Allergen Reactions  . Sulfonamide Derivatives     REACTION: itch   Current Outpatient Prescriptions on File Prior to Visit  Medication Sig Dispense Refill  . acetaminophen (TYLENOL) 500 MG tablet Take  500 mg by mouth every 6 (six) hours as needed. For pain      . tiotropium (SPIRIVA) 18 MCG inhalation capsule Place 18 mcg into inhaler and inhale daily.         Review of Systems  Constitutional: Negative for fever, chills, activity change, appetite change, fatigue and unexpected weight change.  HENT: Negative for hearing loss and neck pain.   Eyes: Negative for visual disturbance.  Respiratory: Negative for cough, chest tightness, shortness of breath and wheezing.   Cardiovascular: Negative for chest pain, palpitations and leg swelling.  Gastrointestinal: Negative for nausea, vomiting, abdominal pain, diarrhea, constipation, blood in stool and abdominal  distention.  Genitourinary: Negative for hematuria and difficulty urinating.  Musculoskeletal: Negative for myalgias and arthralgias.  Skin: Negative for rash.  Neurological: Negative for dizziness, seizures, syncope and headaches.  Hematological: Does not bruise/bleed easily.  Psychiatric/Behavioral: Negative for dysphoric mood. The patient is not nervous/anxious.        Objective:   Physical Exam  Nursing note and vitals reviewed. Constitutional: He is oriented to person, place, and time. He appears well-developed and well-nourished. No distress.  HENT:  Head: Normocephalic and atraumatic.  Right Ear: Hearing, tympanic membrane, external ear and ear canal normal.  Left Ear: Hearing, tympanic membrane, external ear and ear canal normal.  Nose: Nose normal.  Mouth/Throat: Oropharynx is clear and moist. No oropharyngeal exudate.  Eyes: Conjunctivae normal and EOM are normal. Pupils are equal, round, and reactive to light. No scleral icterus.  Neck: Normal range of motion. Neck supple. Carotid bruit is not present. No thyromegaly present.  Cardiovascular: Normal rate, regular rhythm, normal heart sounds and intact distal pulses.   No murmur heard. Pulses:      Radial pulses are 2+ on the right side, and 2+ on the left side.  Pulmonary/Chest: Effort normal and breath sounds normal. No respiratory distress. He has no wheezes. He has no rales.  Abdominal: Soft. Bowel sounds are normal. He exhibits no distension and no mass. There is no tenderness. There is no rebound and no guarding.  Genitourinary:       deferred  Musculoskeletal: Normal range of motion. He exhibits no edema.  Lymphadenopathy:    He has no cervical adenopathy.  Neurological: He is alert and oriented to person, place, and time.       CN grossly intact, station and gait intact  Skin: Skin is warm and dry. No rash noted.  Psychiatric: He has a normal mood and affect. His behavior is normal. Judgment and thought content  normal.       Assessment & Plan:

## 2012-06-22 NOTE — Patient Instructions (Addendum)
pneumovax today. Bring [place] a copy of advanced directive. Return as needed or in 1 year for next medicare wellness visit. Good to see you today, call us with questions.

## 2012-06-22 NOTE — Assessment & Plan Note (Signed)
Weaning off spiriva.  resp status stable.

## 2012-06-22 NOTE — Assessment & Plan Note (Signed)
PSA reassuring. 

## 2012-06-22 NOTE — Assessment & Plan Note (Signed)
I have personally reviewed the Medicare Annual Wellness questionnaire and have noted 1. The patient's medical and social history 2. Their use of alcohol, tobacco or illicit drugs 3. Their current medications and supplements 4. The patient's functional ability including ADL's, fall risks, home safety risks and hearing or visual impairment. 5. Diet and physical activity 6. Evidence for depression or mood disorders The patients weight, height, BMI have been recorded in the chart.  Hearing and vision has been addressed. I have made referrals, counseling and provided education to the patient based review of the above and I have provided the pt with a written personalized care plan for preventive services. See scanned questionairre. Advanced directives discussed: asked him to bring in copy of advanced directives.  Reviewed preventative protocols and updated unless pt declined. Pneumovax today. UTD other immunizations as well as colonoscopy. H/o prostate cancer s/p radical prostatectomy, gets PSA yearly.

## 2012-07-31 HISTORY — PX: REPLACEMENT TOTAL KNEE: SUR1224

## 2012-08-02 ENCOUNTER — Ambulatory Visit: Payer: Self-pay | Admitting: General Practice

## 2012-08-02 LAB — APTT: Activated PTT: 27.7 secs (ref 23.6–35.9)

## 2012-08-02 LAB — URINALYSIS, COMPLETE
Bacteria: NONE SEEN
Blood: NEGATIVE
Glucose,UR: NEGATIVE mg/dL (ref 0–75)
Leukocyte Esterase: NEGATIVE
Nitrite: NEGATIVE
Protein: NEGATIVE
RBC,UR: NONE SEEN /HPF (ref 0–5)
Specific Gravity: 1.014 (ref 1.003–1.030)
WBC UR: <1 /HPF (ref 0–5)

## 2012-08-02 LAB — BASIC METABOLIC PANEL
Anion Gap: 9 (ref 7–16)
BUN: 17 mg/dL (ref 7–18)
Calcium, Total: 8.7 mg/dL (ref 8.5–10.1)
Chloride: 104 mmol/L (ref 98–107)
Creatinine: 0.62 mg/dL (ref 0.60–1.30)
EGFR (African American): >60
EGFR (Non-African Amer.): >60
Osmolality: 280 (ref 275–301)
Potassium: 4.2 mmol/L (ref 3.5–5.1)

## 2012-08-02 LAB — CBC
HGB: 14.8 g/dL (ref 13.0–18.0)
MCH: 33.1 pg (ref 26.0–34.0)
MCV: 99 fL (ref 80–100)
RDW: 13.7 % (ref 11.5–14.5)

## 2012-08-02 LAB — PROTIME-INR
INR: 1
Prothrombin Time: 13.2 secs (ref 11.5–14.7)

## 2012-08-02 LAB — SEDIMENTATION RATE: Erythrocyte Sed Rate: 10 mm/hr (ref 0–20)

## 2012-08-18 ENCOUNTER — Inpatient Hospital Stay: Payer: Self-pay | Admitting: General Practice

## 2012-08-19 LAB — BASIC METABOLIC PANEL
Anion Gap: 7 (ref 7–16)
BUN: 9 mg/dL (ref 7–18)
Co2: 25 mmol/L (ref 21–32)
EGFR (African American): >60
Glucose: 94 mg/dL (ref 65–99)
Sodium: 137 mmol/L (ref 136–145)

## 2012-08-19 LAB — PLATELET COUNT: Platelet: 152 10*3/uL (ref 150–440)

## 2012-08-20 LAB — BASIC METABOLIC PANEL
BUN: 7 mg/dL (ref 7–18)
Calcium, Total: 8.4 mg/dL — ABNORMAL LOW (ref 8.5–10.1)
Chloride: 102 mmol/L (ref 98–107)
Co2: 26 mmol/L (ref 21–32)
Creatinine: 0.75 mg/dL (ref 0.60–1.30)
EGFR (African American): >60
EGFR (Non-African Amer.): >60
Glucose: 123 mg/dL — ABNORMAL HIGH (ref 65–99)

## 2012-08-20 LAB — PLATELET COUNT: Platelet: 158 x10 3/mm 3

## 2013-02-21 ENCOUNTER — Other Ambulatory Visit: Payer: Self-pay | Admitting: Family Medicine

## 2013-02-21 MED ORDER — TIOTROPIUM BROMIDE MONOHYDRATE 18 MCG IN CAPS: 18.0000 ug | ORAL_CAPSULE | Freq: Every day | RESPIRATORY_TRACT | Status: DC

## 2013-05-04 ENCOUNTER — Other Ambulatory Visit (INDEPENDENT_AMBULATORY_CARE_PROVIDER_SITE_OTHER): Payer: Medicare Other

## 2013-05-04 ENCOUNTER — Other Ambulatory Visit: Payer: Self-pay | Admitting: Family Medicine

## 2013-05-04 DIAGNOSIS — E785 Hyperlipidemia, unspecified: Secondary | ICD-10-CM

## 2013-05-04 DIAGNOSIS — Z8546 Personal history of malignant neoplasm of prostate: Secondary | ICD-10-CM

## 2013-05-04 LAB — BASIC METABOLIC PANEL
BUN: 13 mg/dL (ref 6–23)
Chloride: 104 mEq/L (ref 96–112)
Glucose, Bld: 83 mg/dL (ref 70–99)
Potassium: 4.6 mEq/L (ref 3.5–5.1)

## 2013-05-04 LAB — PSA: PSA: 0.01 ng/mL — ABNORMAL LOW (ref 0.10–4.00)

## 2013-05-04 LAB — LIPID PANEL
HDL: 45.4 mg/dL (ref >39.00)
Total CHOL/HDL Ratio: 3
VLDL: 13.2 mg/dL (ref 0.0–40.0)

## 2013-05-05 ENCOUNTER — Encounter: Payer: Self-pay | Admitting: *Deleted

## 2013-05-09 ENCOUNTER — Encounter: Payer: Medicare Other | Admitting: Family Medicine

## 2013-06-24 ENCOUNTER — Encounter: Payer: Self-pay | Admitting: Family Medicine

## 2013-06-24 ENCOUNTER — Ambulatory Visit (INDEPENDENT_AMBULATORY_CARE_PROVIDER_SITE_OTHER): Payer: Medicare Other | Admitting: Family Medicine

## 2013-06-24 VITALS — BP 126/84 | HR 68 | Temp 98.3°F | Ht 75.5 in | Wt 215.5 lb

## 2013-06-24 DIAGNOSIS — E785 Hyperlipidemia, unspecified: Secondary | ICD-10-CM

## 2013-06-24 DIAGNOSIS — Z0000 Encounter for general adult medical examination without abnormal findings: Secondary | ICD-10-CM

## 2013-06-24 MED ORDER — TIOTROPIUM BROMIDE MONOHYDRATE 18 MCG IN CAPS: 18.0000 ug | ORAL_CAPSULE | Freq: Every day | RESPIRATORY_TRACT | Status: DC

## 2013-06-24 NOTE — Progress Notes (Signed)
Pre-visit discussion using our clinic review tool. No additional management support is needed unless otherwise documented below in the visit note.  

## 2013-06-24 NOTE — Patient Instructions (Addendum)
Bring me a copy of your advanced directive to update your chart. Spiriva was refilled. Good to see you today, call us with quesitons.

## 2013-06-24 NOTE — Assessment & Plan Note (Signed)
Stable off meds. ?

## 2013-06-24 NOTE — Progress Notes (Signed)
Subjective:    Patient ID: Carlos Olson, male    DOB: [birth year], 72 y.o.   MRN: [id number]  HPI CC: medicare wellness visit  Prefers I call him "[name]"  Pleasant 72 yo with h/o prostate cancer s/p prostatectomy, HLD, COPD well controlled on spiriva, former smoker (quit 2008) presents for medicare wellness visit. No concerns today.  R knee replacement this year ([facility]). Brother had stroke in July of this year - with residual R hemiparesis.  Pt helped his brother - now with recurring knee pain. Having significant arthralgias - has scheduled appt with rheumatologist Dr. Gavin Potters in next few weeks.  Denies synovitis sxs. Has been told has more arthritis than would be expected when he's had surgery.  Wt Readings from Last 3 Encounters:  06/24/13 215 lb 8 oz (97.75 kg)  06/22/12 210 lb 4 oz (95.369 kg)  07/29/11 205 lb (92.987 kg)   COPD - using spiriva intermittently.  Mainly with high levels of pollen.  100 PY hx.  Hearing screen passed Vision screen passed.  Denies depression, anhedonia, sadness. 1 fall without injury in past year. Mechanical fall - at [employer] while practicing his putting.  Preventative: Colon 02/2013 - Medhoff, tortuous colon, diverticulosis and polyp, rpt 5 yrs Prostate cancer - s/p prostatectomy 1990s Flu shot 2013  Td 2010  Pneumovax 2013 zostavax 2012  Advanced directives: has at home. Asked him to bring [place] a copy. Wife is HCPOA.  Lives with wife. Grown children  Occupation: retired, was in Community education officer business  Activity: currently limited 2/2 knee pain  Diet: some water, vegetables daily  Medications and allergies reviewed and updated in chart.  Past histories reviewed and updated if relevant as below. Patient Active Problem List   Diagnosis Date Noted  . Medicare annual wellness visit, subsequent 06/22/2012  . Cervical stenosis of spinal canal 07/29/2011    Class: Chronic  . Preop examination 07/17/2011  . SHOULDER PAIN, RIGHT  04/18/2010  . History of tobacco abuse 12/02/2006  . HYPERLIPIDEMIA 12/01/2006  . COPD 12/01/2006  . DIVERTICULOSIS, COLON 12/01/2006  . BACK PAIN, CHRONIC 12/01/2006  . PROSTATE CANCER, HX OF 12/01/2006   Past Medical History  Diagnosis Date  . History of prostate cancer   . COPD (chronic obstructive pulmonary disease)   . Diverticulosis of colon   . Pelvic fracture   . IBS (irritable bowel syndrome)     improved after retirement  . Colon polyps    Past Surgical History  Procedure Laterality Date  . Prostatectomy  08/1990  . Tonsillectomy    . Knee arthroscopy  09/1997    left  . Hernia repair      right 05/1999, left 09/04/05  . Anterior cervical decomp/discectomy fusion  07/29/2011    Procedure: ANTERIOR CERVICAL DECOMPRESSION/DISCECTOMY FUSION 1 LEVEL;  Surgeon: Kerrin Champagne, MD;  Location: [facility];  Service: Orthopedics;  Laterality: N/A;  Anterior Cervical Discectomy and Fusion C3-4  . Colonoscopy  12/2007    Medhoff, diverticulosis and polyp, rpt 5 yrs   History  Substance Use Topics  . Smoking status: Former Smoker    Quit date: 05/15/2007  . Smokeless tobacco: Never Used     Comment: quit 06/16/2007  . Alcohol Use: 7.0 oz/week    14 drink(s) per week     Comment: 2 drinks a day   Family History  Problem Relation Age of Onset  . Hypertension Mother   . Cancer Father     bone, prostate  .  Diabetes Sister     x 40 years  . Cancer Brother     prostate   Allergies  Allergen Reactions  . Sulfonamide Derivatives     REACTION: itch   Current Outpatient Prescriptions on File Prior to Visit  Medication Sig Dispense Refill  . acetaminophen (TYLENOL) 500 MG tablet Take 500 mg by mouth every 6 (six) hours as needed. For pain      . tiotropium (SPIRIVA) 18 MCG inhalation capsule Place 1 capsule (18 mcg total) into inhaler and inhale daily.  30 capsule  0   No current facility-administered medications on file prior to visit.     Review of Systems  Constitutional:  Negative for fever, chills, activity change, appetite change, fatigue and unexpected weight change.  HENT: Negative for hearing loss.   Eyes: Negative for visual disturbance.  Respiratory: Negative for cough, chest tightness, shortness of breath and wheezing.   Cardiovascular: Negative for chest pain, palpitations and leg swelling.  Gastrointestinal: Negative for nausea, vomiting, abdominal pain, diarrhea, constipation, blood in stool and abdominal distention.  Genitourinary: Negative for hematuria and difficulty urinating.  Musculoskeletal: Negative for arthralgias, myalgias and neck pain.  Skin: Negative for rash.  Neurological: Negative for dizziness, seizures, syncope and headaches.  Hematological: Negative for adenopathy. Does not bruise/bleed easily.  Psychiatric/Behavioral: Negative for dysphoric mood. The patient is not nervous/anxious.        Objective:   Physical Exam  Nursing note and vitals reviewed. Constitutional: He is oriented to person, place, and time. He appears well-developed and well-nourished. No distress.  HENT:  Head: Normocephalic and atraumatic.  Right Ear: Hearing, tympanic membrane, external ear and ear canal normal.  Left Ear: Hearing, tympanic membrane, external ear and ear canal normal.  Nose: Nose normal.  Mouth/Throat: Oropharynx is clear and moist. No oropharyngeal exudate.  Eyes: Conjunctivae and EOM are normal. Pupils are equal, round, and reactive to light. No scleral icterus.  Neck: Normal range of motion. Neck supple. Carotid bruit is not present. No thyromegaly present.  Cardiovascular: Normal rate, regular rhythm, normal heart sounds and intact distal pulses.   No murmur heard. Pulses:      Radial pulses are 2+ on the right side, and 2+ on the left side.  Pulmonary/Chest: Effort normal and breath sounds normal. No respiratory distress. He has no wheezes. He has no rales.  Abdominal: Soft. Bowel sounds are normal. He exhibits no distension and  no mass. There is no tenderness. There is no rebound and no guarding.  Musculoskeletal: Normal range of motion. He exhibits no edema.  Lymphadenopathy:    He has no cervical adenopathy.  Neurological: He is alert and oriented to person, place, and time.  CN grossly intact, station and gait intact  Skin: Skin is warm and dry. No rash noted.  Psychiatric: He has a normal mood and affect. His behavior is normal. Judgment and thought content normal.       Assessment & Plan:

## 2013-06-24 NOTE — Assessment & Plan Note (Signed)
I have personally reviewed the Medicare Annual Wellness questionnaire and have noted 1. The patient's medical and social history 2. Their use of alcohol, tobacco or illicit drugs 3. Their current medications and supplements 4. The patient's functional ability including ADL's, fall risks, home safety risks and hearing or visual impairment. 5. Diet and physical activity 6. Evidence for depression or mood disorders The patients weight, height, BMI have been recorded in the chart.  Hearing and vision has been addressed. I have made referrals, counseling and provided education to the patient based review of the above and I have provided the pt with a written personalized care plan for preventive services. See scanned questionairre. Advanced directives discussed: pt will bring me copy of his advanced directives.  Reviewed preventative protocols and updated unless pt declined. UTD immunizations. H/o prostate cancer s/p radical prostatectomy - continue with yearly PSAs. Recent colonoscopy stable.

## 2013-07-07 ENCOUNTER — Encounter: Payer: Self-pay | Admitting: Family Medicine

## 2013-07-25 ENCOUNTER — Ambulatory Visit: Payer: Self-pay | Admitting: Neurology

## 2013-10-25 ENCOUNTER — Other Ambulatory Visit (HOSPITAL_COMMUNITY): Payer: Self-pay | Admitting: Specialist

## 2013-10-28 HISTORY — PX: FORAMINOTOMY 2 LEVEL: SHX5836

## 2013-11-15 ENCOUNTER — Encounter (HOSPITAL_COMMUNITY): Payer: Self-pay | Admitting: Pharmacy Technician

## 2013-11-16 ENCOUNTER — Encounter (HOSPITAL_COMMUNITY)
Admission: RE | Admit: 2013-11-16 | Discharge: 2013-11-16 | Disposition: A | Discharge status: Home / Self Care | Payer: Medicare Other | Source: Ambulatory Visit | Attending: Anesthesiology | Admitting: Anesthesiology

## 2013-11-16 ENCOUNTER — Encounter (HOSPITAL_COMMUNITY): Payer: Self-pay

## 2013-11-16 ENCOUNTER — Encounter (HOSPITAL_COMMUNITY)
Admission: RE | Admit: 2013-11-16 | Discharge: 2013-11-16 | Disposition: A | Discharge status: Home / Self Care | Payer: Medicare Other | Source: Ambulatory Visit | Attending: Specialist | Admitting: Specialist

## 2013-11-16 DIAGNOSIS — Z01810 Encounter for preprocedural cardiovascular examination: Secondary | ICD-10-CM | POA: Insufficient documentation

## 2013-11-16 DIAGNOSIS — J4489 Other specified chronic obstructive pulmonary disease: Secondary | ICD-10-CM | POA: Insufficient documentation

## 2013-11-16 DIAGNOSIS — J449 Chronic obstructive pulmonary disease, unspecified: Secondary | ICD-10-CM | POA: Insufficient documentation

## 2013-11-16 DIAGNOSIS — Z01818 Encounter for other preprocedural examination: Secondary | ICD-10-CM | POA: Insufficient documentation

## 2013-11-16 DIAGNOSIS — Z01812 Encounter for preprocedural laboratory examination: Secondary | ICD-10-CM | POA: Insufficient documentation

## 2013-11-16 HISTORY — DX: Shortness of breath: R06.02

## 2013-11-16 HISTORY — DX: Malignant (primary) neoplasm, unspecified: C80.1

## 2013-11-16 HISTORY — DX: Pneumonia, unspecified organism: J18.9

## 2013-11-16 HISTORY — DX: Cardiac murmur, unspecified: R01.1

## 2013-11-16 LAB — COMPREHENSIVE METABOLIC PANEL
ALT: 15 U/L (ref 0–53)
AST: 22 U/L (ref 0–37)
Albumin: 3.8 g/dL (ref 3.5–5.2)
Alkaline Phosphatase: 68 U/L (ref 39–117)
BUN: 14 mg/dL (ref 6–23)
CALCIUM: 9.3 mg/dL (ref 8.4–10.5)
CO2: 26 mEq/L (ref 19–32)
Chloride: 104 mEq/L (ref 96–112)
Creatinine, Ser: 0.86 mg/dL (ref 0.50–1.35)
GFR calc Af Amer: >90 mL/min (ref >90)
GFR calc non Af Amer: 84 mL/min — ABNORMAL LOW (ref >90)
Glucose, Bld: 96 mg/dL (ref 70–99)
Potassium: 4.6 mEq/L (ref 3.7–5.3)
Sodium: 141 mEq/L (ref 137–147)
TOTAL PROTEIN: 6.9 g/dL (ref 6.0–8.3)
Total Bilirubin: 0.6 mg/dL (ref 0.3–1.2)

## 2013-11-16 LAB — SURGICAL PCR SCREEN
MRSA, PCR: NEGATIVE
STAPHYLOCOCCUS AUREUS: NEGATIVE

## 2013-11-16 LAB — CBC
HEMATOCRIT: 44.5 % (ref 39.0–52.0)
Hemoglobin: 15.2 g/dL (ref 13.0–17.0)
MCH: 33.1 pg (ref 26.0–34.0)
MCHC: 34.2 g/dL (ref 30.0–36.0)
MCV: 96.9 fL (ref 78.0–100.0)
PLATELETS: 208 10*3/uL (ref 150–400)
RBC: 4.59 MIL/uL (ref 4.22–5.81)
RDW: 13.4 % (ref 11.5–15.5)
WBC: 5.8 10*3/uL (ref 4.0–10.5)

## 2013-11-16 NOTE — H&P (Signed)
Carlos Olson is an 73 y.o. male.   Chief Complaint:  Neck and left arm pain HPI: Pt is S/P ACDF C3-4 in 2013.  Did well post op with resolution of neck and arm pain.  New onset of left arm pain with numbness and tingling which has progressed over the last year despite conservative treatment.  MRI with moderate spinal stenosis and left foraminal encroachment at C4-5 and spondylosis with spinal stenosis and foraminal stenosis C5-6.  Pt wishes to proceed with left C4-5 and C5-6 foraminotomies.    Past Medical History  Diagnosis Date  . History of prostate cancer   . COPD (chronic obstructive pulmonary disease)   . Diverticulosis of colon   . Pelvic fracture   . IBS (irritable bowel syndrome)     improved after retirement  . Colon polyps   . Osteoarthritis     knees and cervical/lumbar spine s/p surgeries and injections  . Femoral bruit     bilateral, found by Dr. Jefm Bryant  . Heart murmur     child  . Shortness of breath   . Pneumonia     child  . Cancer     s/p prostate    Past Surgical History  Procedure Laterality Date  . Prostatectomy  08/1990  . Tonsillectomy    . Knee arthroscopy  09/1997    left  . Hernia repair      right 05/1999, left 09/04/05  . Anterior cervical decomp/discectomy fusion  07/29/2011    Procedure: ANTERIOR CERVICAL DECOMPRESSION/DISCECTOMY FUSION 1 LEVEL;  Surgeon: Jessy Oto, MD;  Location: [facility];  Service: Orthopedics;  Laterality: N/A;  Anterior Cervical Discectomy and Fusion C3-4  . Colonoscopy  12/2007    Medhoff, diverticulosis and polyp, rpt 5 yrs  . Replacement total knee Right 07/2012    Family History  Problem Relation Age of Onset  . Hypertension Mother   . Cancer Father     bone, prostate  . Diabetes Sister     x 40 years  . Cancer Brother     prostate   Social History:  reports that he quit smoking about 6 years ago. His smoking use included Cigarettes. He has a 100 pack-year smoking history. He has never used smokeless tobacco. He  reports that he drinks about 7 ounces of alcohol per week. He reports that he does not use illicit drugs.  Allergies:  Allergies  Allergen Reactions  . Sulfonamide Derivatives     REACTION: itch    Medications Prior to Admission  Medication Sig Dispense Refill  . acetaminophen (TYLENOL) 500 MG tablet Take 500 mg by mouth every 6 (six) hours as needed (pain).       Marland Kitchen tiotropium (SPIRIVA) 18 MCG inhalation capsule Place 1 capsule (18 mcg total) into inhaler and inhale daily.  30 capsule  3    No results found for this or any previous visit (from the past 48 hour(s)). No results found.  Review of Systems  Constitutional: Negative.   Eyes: Negative.   Respiratory: Negative.   Cardiovascular: Negative.   Gastrointestinal: Negative.   Genitourinary: Negative.   Musculoskeletal: Positive for neck pain.  Skin: Negative.   Neurological: Positive for sensory change.       Left arm and shoulder  Psychiatric/Behavioral: Negative.     Blood pressure 120/72, pulse 72, temperature 97.6 F (36.4 C), temperature source Oral, resp. rate 20, height 6\' 3"  (1.905 m), weight 96.616 kg (213 lb). Physical Exam  Constitutional: He is  oriented to person, place, and time. He appears well-developed and well-nourished.  HENT:  Head: Atraumatic.  Eyes: EOM are normal. Pupils are equal, round, and reactive to light.  Cardiovascular: Normal rate, regular rhythm, normal heart sounds and intact distal pulses.   Respiratory: Effort normal and breath sounds normal.  GI: Soft. Bowel sounds are normal.  Musculoskeletal:  Numbness left shoulder and forearm.  Mild weakness in left C4 an C5 distribution.  Decreased ROM Cspine.  Neurological: He is alert and oriented to person, place, and time.  Skin: Skin is warm and dry.  Psychiatric: He has a normal mood and affect.     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  Jessy Oto 11/22/2013, 11:55  AM

## 2013-11-16 NOTE — Pre-Procedure Instructions (Addendum)
Carlos Olson  11/16/2013   Your procedure is scheduled on:  11/22/13  Report to [facility] short stay admitting at 1045 AM.  Call this number if you have problems the morning of surgery: [phone]   Remember:   Do not eat food or drink liquids after midnight.   Take these medicines the morning of surgery with A SIP OF WATER:inhaler, (bring inhaler) tylenol if needed                  STOP all herbel meds, nsaids (aleve,naproxen,advil,ibuprofen) now including aspirin ,vitamins   Do not wear jewelry, make-up or nail polish.  Do not wear lotions, powders, or perfumes. You may wear deodorant.  Do not shave 48 hours prior to surgery. Men may shave face and neck.  Do not bring valuables to the hospital.  [facility] is not responsible                  for any belongings or valuables.               Contacts, dentures or bridgework may not be worn into surgery.  Leave suitcase in the car. After surgery it may be brought to your room.  For patients admitted to the hospital, discharge time is determined by your                treatment team.               Patients discharged the day of surgery will not be allowed to drive  home.  Name and phone number of your driver:   Special Instructions:  Special Instructions:  - Preparing for Surgery  Before surgery, you can play an important role.  Because skin is not sterile, your skin needs to be as free of germs as possible.  You can reduce the number of germs on you skin by washing with CHG (chlorahexidine gluconate) soap before surgery.  CHG is an antiseptic cleaner which kills germs and bonds with the skin to continue killing germs even after washing.  Please DO NOT use if you have an allergy to CHG or antibacterial soaps.  If your skin becomes reddened/irritated stop using the CHG and inform your nurse when you arrive at Short Stay.  Do not shave (including legs and underarms) for at least 48 hours prior to the first CHG shower.  You  may shave your face.  Please follow these instructions carefully:   1.  Shower with CHG Soap the night before surgery and the morning of Surgery.  2.  If you choose to wash your hair, wash your hair first as usual with your normal shampoo.  3.  After you shampoo, rinse your hair and body thoroughly to remove the Shampoo.  4.  Use CHG as you would any other liquid soap.  You can apply chg directly  to the skin and wash gently with scrungie or a clean washcloth.  5.  Apply the CHG Soap to your body ONLY FROM THE NECK DOWN.  Do not use on open wounds or open sores.  Avoid contact with your eyes ears, mouth and genitals (private parts).  Wash genitals (private parts)       with your normal soap.  6.  Wash thoroughly, paying special attention to the area where your surgery will be performed.  7.  Thoroughly rinse your body with warm water from the neck down.  8.  DO NOT shower/wash with your normal soap  after using and rinsing off the CHG Soap.  9.  Pat yourself dry with a clean towel.            10.  Wear clean pajamas.            11.  Place clean sheets on your bed the night of your first shower and do not sleep with pets.  Day of Surgery  Do not apply any lotions/deodorants the morning of surgery.  Please wear clean clothes to the hospital/surgery center.   Please read over the following fact sheets that you were given: Pain Booklet, Coughing and Deep Breathing, MRSA Information and Surgical Site Infection Prevention

## 2013-11-18 NOTE — Progress Notes (Signed)
Patient made aware of time change and will be here @ 10:00 AM

## 2013-11-21 MED ORDER — CHLORHEXIDINE GLUCONATE 4 % EX LIQD
60.0000 mL | Freq: Once | CUTANEOUS | Status: DC
  Filled 2013-11-21: qty 60

## 2013-11-21 MED ORDER — CEFAZOLIN SODIUM-DEXTROSE 2-3 GM-% IV SOLR
2.0000 g | INTRAVENOUS | Status: AC
  Administered 2013-11-22: 2 g via INTRAVENOUS
  Filled 2013-11-21: qty 50

## 2013-11-22 ENCOUNTER — Encounter (HOSPITAL_COMMUNITY): Payer: Medicare Other | Admitting: Critical Care Medicine

## 2013-11-22 ENCOUNTER — Ambulatory Visit (HOSPITAL_COMMUNITY): Payer: Medicare Other | Admitting: Critical Care Medicine

## 2013-11-22 ENCOUNTER — Encounter (HOSPITAL_COMMUNITY): Payer: Self-pay | Admitting: *Deleted

## 2013-11-22 ENCOUNTER — Observation Stay (HOSPITAL_COMMUNITY)
Admission: RE | Admit: 2013-11-22 | Discharge: 2013-11-23 | Disposition: A | Discharge status: Home / Self Care | Payer: Medicare Other | Source: Ambulatory Visit | Attending: Specialist | Admitting: Specialist

## 2013-11-22 ENCOUNTER — Ambulatory Visit (HOSPITAL_COMMUNITY): Payer: Medicare Other

## 2013-11-22 ENCOUNTER — Encounter (HOSPITAL_COMMUNITY)
Admission: RE | Disposition: A | Discharge status: Home / Self Care | Payer: Self-pay | Source: Ambulatory Visit | Attending: Specialist

## 2013-11-22 DIAGNOSIS — Z87891 Personal history of nicotine dependence: Secondary | ICD-10-CM | POA: Insufficient documentation

## 2013-11-22 DIAGNOSIS — J449 Chronic obstructive pulmonary disease, unspecified: Secondary | ICD-10-CM | POA: Insufficient documentation

## 2013-11-22 DIAGNOSIS — J4489 Other specified chronic obstructive pulmonary disease: Secondary | ICD-10-CM | POA: Insufficient documentation

## 2013-11-22 DIAGNOSIS — M1710 Unilateral primary osteoarthritis, unspecified knee: Secondary | ICD-10-CM | POA: Insufficient documentation

## 2013-11-22 DIAGNOSIS — Z8546 Personal history of malignant neoplasm of prostate: Secondary | ICD-10-CM | POA: Insufficient documentation

## 2013-11-22 DIAGNOSIS — M4712 Other spondylosis with myelopathy, cervical region: Principal | ICD-10-CM | POA: Diagnosis present

## 2013-11-22 DIAGNOSIS — M4802 Spinal stenosis, cervical region: Secondary | ICD-10-CM | POA: Diagnosis present

## 2013-11-22 DIAGNOSIS — K5730 Diverticulosis of large intestine without perforation or abscess without bleeding: Secondary | ICD-10-CM | POA: Insufficient documentation

## 2013-11-22 HISTORY — PX: POSTERIOR CERVICAL FUSION/FORAMINOTOMY: SHX5038

## 2013-11-22 SURGERY — POSTERIOR CERVICAL FUSION/FORAMINOTOMY LEVEL 2
Anesthesia: General | Site: Spine Cervical

## 2013-11-22 MED ORDER — SODIUM CHLORIDE 0.9 % IV SOLN: 250.0000 mL | INTRAVENOUS | Status: DC

## 2013-11-22 MED ORDER — THROMBIN 20000 UNITS EX SOLR
CUTANEOUS | Status: AC
  Filled 2013-11-22: qty 20000

## 2013-11-22 MED ORDER — OXYCODONE HCL 5 MG/5ML PO SOLN: 5.0000 mg | Freq: Once | ORAL | Status: DC | PRN

## 2013-11-22 MED ORDER — KCL IN DEXTROSE-NACL 20-5-0.45 MEQ/L-%-% IV SOLN
INTRAVENOUS | Status: DC
  Administered 2013-11-22: 20:00:00 via INTRAVENOUS
  Filled 2013-11-22 (×3): qty 1000

## 2013-11-22 MED ORDER — MENTHOL 3 MG MT LOZG: 1.0000 | LOZENGE | OROMUCOSAL | Status: DC | PRN

## 2013-11-22 MED ORDER — MIDAZOLAM HCL 2 MG/2ML IJ SOLN: 0.5000 mg | Freq: Once | INTRAMUSCULAR | Status: DC | PRN

## 2013-11-22 MED ORDER — ACETAMINOPHEN 325 MG PO TABS: 650.0000 mg | ORAL_TABLET | ORAL | Status: DC | PRN

## 2013-11-22 MED ORDER — OXYCODONE-ACETAMINOPHEN 5-325 MG PO TABS: 1.0000 | ORAL_TABLET | ORAL | Status: DC | PRN

## 2013-11-22 MED ORDER — EPHEDRINE SULFATE 50 MG/ML IJ SOLN
INTRAMUSCULAR | Status: DC | PRN
  Administered 2013-11-22: 10 mg via INTRAVENOUS

## 2013-11-22 MED ORDER — ONDANSETRON HCL 4 MG/2ML IJ SOLN
INTRAMUSCULAR | Status: DC | PRN
  Administered 2013-11-22: 4 mg via INTRAVENOUS

## 2013-11-22 MED ORDER — ACETAMINOPHEN 650 MG RE SUPP: 650.0000 mg | RECTAL | Status: DC | PRN

## 2013-11-22 MED ORDER — ONDANSETRON HCL 4 MG/2ML IJ SOLN: 4.0000 mg | INTRAMUSCULAR | Status: DC | PRN

## 2013-11-22 MED ORDER — ROCURONIUM BROMIDE 50 MG/5ML IV SOLN
INTRAVENOUS | Status: AC
  Filled 2013-11-22: qty 1

## 2013-11-22 MED ORDER — METHOCARBAMOL 500 MG PO TABS
500.0000 mg | ORAL_TABLET | Freq: Four times a day (QID) | ORAL | Status: DC | PRN
  Administered 2013-11-22 – 2013-11-23 (×2): 500 mg via ORAL
  Filled 2013-11-22 (×2): qty 1

## 2013-11-22 MED ORDER — MIDAZOLAM HCL 5 MG/5ML IJ SOLN
INTRAMUSCULAR | Status: DC | PRN
  Administered 2013-11-22: 2 mg via INTRAVENOUS

## 2013-11-22 MED ORDER — HYDROMORPHONE HCL PF 1 MG/ML IJ SOLN
0.2500 mg | INTRAMUSCULAR | Status: DC | PRN
  Administered 2013-11-22 (×2): 0.5 mg via INTRAVENOUS

## 2013-11-22 MED ORDER — PROPOFOL 10 MG/ML IV BOLUS
INTRAVENOUS | Status: DC | PRN
  Administered 2013-11-22: 150 mg via INTRAVENOUS

## 2013-11-22 MED ORDER — PROMETHAZINE HCL 25 MG/ML IJ SOLN: 6.2500 mg | INTRAMUSCULAR | Status: DC | PRN

## 2013-11-22 MED ORDER — CEFAZOLIN SODIUM 1-5 GM-% IV SOLN
1.0000 g | Freq: Once | INTRAVENOUS | Status: AC
  Administered 2013-11-22: 1 g via INTRAVENOUS
  Filled 2013-11-22: qty 50

## 2013-11-22 MED ORDER — TIOTROPIUM BROMIDE MONOHYDRATE 18 MCG IN CAPS
18.0000 ug | ORAL_CAPSULE | Freq: Every day | RESPIRATORY_TRACT | Status: DC
  Filled 2013-11-22: qty 5

## 2013-11-22 MED ORDER — PHENOL 1.4 % MT LIQD: 1.0000 | OROMUCOSAL | Status: DC | PRN

## 2013-11-22 MED ORDER — GLYCOPYRROLATE 0.2 MG/ML IJ SOLN
INTRAMUSCULAR | Status: DC | PRN
  Administered 2013-11-22: 0.2 mg via INTRAVENOUS
  Administered 2013-11-22: 0.6 mg via INTRAVENOUS

## 2013-11-22 MED ORDER — OXYCODONE HCL 5 MG PO TABS: 5.0000 mg | ORAL_TABLET | Freq: Once | ORAL | Status: DC | PRN

## 2013-11-22 MED ORDER — CEFAZOLIN SODIUM-DEXTROSE 2-3 GM-% IV SOLR
INTRAVENOUS | Status: AC
  Filled 2013-11-22: qty 50

## 2013-11-22 MED ORDER — BUPIVACAINE-EPINEPHRINE 0.5% -1:200000 IJ SOLN
INTRAMUSCULAR | Status: DC | PRN
  Administered 2013-11-22: 10 mL

## 2013-11-22 MED ORDER — NEOSTIGMINE METHYLSULFATE 10 MG/10ML IV SOLN
INTRAVENOUS | Status: DC | PRN
  Administered 2013-11-22: 4 mg via INTRAVENOUS

## 2013-11-22 MED ORDER — GLYCOPYRROLATE 0.2 MG/ML IJ SOLN
INTRAMUSCULAR | Status: AC
  Filled 2013-11-22: qty 3

## 2013-11-22 MED ORDER — FENTANYL CITRATE 0.05 MG/ML IJ SOLN
INTRAMUSCULAR | Status: AC
  Filled 2013-11-22: qty 5

## 2013-11-22 MED ORDER — THROMBIN 20000 UNITS EX KIT
PACK | CUTANEOUS | Status: AC
  Filled 2013-11-22: qty 1

## 2013-11-22 MED ORDER — BISACODYL 10 MG RE SUPP: 10.0000 mg | Freq: Every day | RECTAL | Status: DC | PRN

## 2013-11-22 MED ORDER — PANTOPRAZOLE SODIUM 40 MG IV SOLR
40.0000 mg | Freq: Every day | INTRAVENOUS | Status: DC
  Administered 2013-11-22: 40 mg via INTRAVENOUS
  Filled 2013-11-22 (×2): qty 40

## 2013-11-22 MED ORDER — METHOCARBAMOL 500 MG PO TABS: 500.0000 mg | ORAL_TABLET | Freq: Three times a day (TID) | ORAL | Status: DC | PRN

## 2013-11-22 MED ORDER — PHENYLEPHRINE HCL 10 MG/ML IJ SOLN
10.0000 mg | INTRAVENOUS | Status: DC | PRN
  Administered 2013-11-22: 50 ug/min via INTRAVENOUS

## 2013-11-22 MED ORDER — OXYCODONE-ACETAMINOPHEN 5-325 MG PO TABS
1.0000 | ORAL_TABLET | ORAL | Status: DC | PRN
  Administered 2013-11-22 – 2013-11-23 (×4): 2 via ORAL
  Filled 2013-11-22 (×4): qty 2

## 2013-11-22 MED ORDER — MORPHINE SULFATE 2 MG/ML IJ SOLN
1.0000 mg | INTRAMUSCULAR | Status: DC | PRN
Start: 2013-11-22 — End: 2013-11-23
  Administered 2013-11-22: 2 mg via INTRAVENOUS

## 2013-11-22 MED ORDER — MORPHINE SULFATE 2 MG/ML IJ SOLN
INTRAMUSCULAR | Status: AC
  Filled 2013-11-22: qty 1

## 2013-11-22 MED ORDER — NEOSTIGMINE METHYLSULFATE 10 MG/10ML IV SOLN
INTRAVENOUS | Status: AC
  Filled 2013-11-22: qty 1

## 2013-11-22 MED ORDER — LIDOCAINE HCL (CARDIAC) 20 MG/ML IV SOLN
INTRAVENOUS | Status: DC | PRN
  Administered 2013-11-22: 20 mg via INTRAVENOUS

## 2013-11-22 MED ORDER — ONDANSETRON HCL 4 MG/2ML IJ SOLN
INTRAMUSCULAR | Status: AC
  Filled 2013-11-22: qty 2

## 2013-11-22 MED ORDER — MIDAZOLAM HCL 2 MG/2ML IJ SOLN
INTRAMUSCULAR | Status: AC
  Filled 2013-11-22: qty 2

## 2013-11-22 MED ORDER — FLEET ENEMA 7-19 GM/118ML RE ENEM: 1.0000 | ENEMA | Freq: Once | RECTAL | Status: AC | PRN

## 2013-11-22 MED ORDER — METHOCARBAMOL 1000 MG/10ML IJ SOLN
500.0000 mg | Freq: Four times a day (QID) | INTRAVENOUS | Status: DC | PRN
  Filled 2013-11-22: qty 5

## 2013-11-22 MED ORDER — DOCUSATE SODIUM 100 MG PO CAPS
100.0000 mg | ORAL_CAPSULE | Freq: Two times a day (BID) | ORAL | Status: DC
  Administered 2013-11-22 – 2013-11-23 (×2): 100 mg via ORAL
  Filled 2013-11-22 (×3): qty 1

## 2013-11-22 MED ORDER — KETOROLAC TROMETHAMINE 30 MG/ML IJ SOLN
30.0000 mg | Freq: Four times a day (QID) | INTRAMUSCULAR | Status: DC
Start: 2013-11-22 — End: 2013-11-23
  Administered 2013-11-22 – 2013-11-23 (×4): 30 mg via INTRAVENOUS
  Filled 2013-11-22 (×8): qty 1

## 2013-11-22 MED ORDER — BUPIVACAINE-EPINEPHRINE (PF) 0.5% -1:200000 IJ SOLN
INTRAMUSCULAR | Status: AC
  Filled 2013-11-22: qty 30

## 2013-11-22 MED ORDER — SODIUM CHLORIDE 0.9 % IJ SOLN
3.0000 mL | Freq: Two times a day (BID) | INTRAMUSCULAR | Status: DC
  Administered 2013-11-22 – 2013-11-23 (×2): 3 mL via INTRAVENOUS

## 2013-11-22 MED ORDER — LACTATED RINGERS IV SOLN
INTRAVENOUS | Status: DC
  Administered 2013-11-22 (×2): via INTRAVENOUS

## 2013-11-22 MED ORDER — HYDROMORPHONE HCL PF 1 MG/ML IJ SOLN
INTRAMUSCULAR | Status: AC
  Filled 2013-11-22: qty 1

## 2013-11-22 MED ORDER — ROCURONIUM BROMIDE 100 MG/10ML IV SOLN
INTRAVENOUS | Status: DC | PRN
  Administered 2013-11-22: 50 mg via INTRAVENOUS
  Administered 2013-11-22: 10 mg via INTRAVENOUS

## 2013-11-22 MED ORDER — HYDROCODONE-ACETAMINOPHEN 5-325 MG PO TABS
1.0000 | ORAL_TABLET | ORAL | Status: DC | PRN
  Administered 2013-11-23: 1 via ORAL
  Filled 2013-11-22: qty 1

## 2013-11-22 MED ORDER — ARTIFICIAL TEARS OP OINT
TOPICAL_OINTMENT | OPHTHALMIC | Status: DC | PRN
  Administered 2013-11-22: 1 via OPHTHALMIC

## 2013-11-22 MED ORDER — THROMBIN 20000 UNITS EX SOLR
CUTANEOUS | Status: DC | PRN
  Administered 2013-11-22: 20000 [IU] via TOPICAL

## 2013-11-22 MED ORDER — FENTANYL CITRATE 0.05 MG/ML IJ SOLN
INTRAMUSCULAR | Status: DC | PRN
  Administered 2013-11-22: 50 ug via INTRAVENOUS
  Administered 2013-11-22: 25 ug via INTRAVENOUS
  Administered 2013-11-22 (×3): 50 ug via INTRAVENOUS
  Administered 2013-11-22: 200 ug via INTRAVENOUS
  Administered 2013-11-22: 25 ug via INTRAVENOUS
  Administered 2013-11-22: 50 ug via INTRAVENOUS

## 2013-11-22 MED ORDER — SENNOSIDES-DOCUSATE SODIUM 8.6-50 MG PO TABS: 1.0000 | ORAL_TABLET | Freq: Every evening | ORAL | Status: DC | PRN

## 2013-11-22 MED ORDER — PHENYLEPHRINE HCL 10 MG/ML IJ SOLN
INTRAMUSCULAR | Status: DC | PRN
  Administered 2013-11-22 (×2): 80 ug via INTRAVENOUS

## 2013-11-22 MED ORDER — SODIUM CHLORIDE 0.9 % IJ SOLN: 3.0000 mL | INTRAMUSCULAR | Status: DC | PRN

## 2013-11-22 MED ORDER — PHENYLEPHRINE 40 MCG/ML (10ML) SYRINGE FOR IV PUSH (FOR BLOOD PRESSURE SUPPORT)
PREFILLED_SYRINGE | INTRAVENOUS | Status: AC
  Filled 2013-11-22: qty 10

## 2013-11-22 MED ORDER — MEPERIDINE HCL 25 MG/ML IJ SOLN: 6.2500 mg | INTRAMUSCULAR | Status: DC | PRN

## 2013-11-22 SURGICAL SUPPLY — 59 items
ADH SKN CLS APL DERMABOND .7 (GAUZE/BANDAGES/DRESSINGS) ×1
BLADE SURG ROTATE 9660 (MISCELLANEOUS) IMPLANT
BUR MATCHSTICK NEURO 3.0 LAGG (BURR) ×2 IMPLANT
BUR ROUND FLUTED 4 SOFT TCH (BURR) IMPLANT
BUR SABER RD CUTTING 3.0 (BURR) IMPLANT
COLLAR CERV LO CONTOUR FIRM DE (SOFTGOODS) ×2 IMPLANT
CORDS BIPOLAR (ELECTRODE) ×2 IMPLANT
COVER SURGICAL LIGHT HANDLE (MISCELLANEOUS) ×2 IMPLANT
DERMABOND ADVANCED (GAUZE/BANDAGES/DRESSINGS) ×1
DERMABOND ADVANCED .7 DNX12 (GAUZE/BANDAGES/DRESSINGS) ×1 IMPLANT
DRAPE C-ARM 42X72 X-RAY (DRAPES) ×3 IMPLANT
DRAPE INCISE IOBAN 66X45 STRL (DRAPES) IMPLANT
DRAPE LAPAROTOMY T 102X78X121 (DRAPES) ×2 IMPLANT
DRAPE MICROSCOPE LEICA (MISCELLANEOUS) ×1 IMPLANT
DRAPE PROXIMA HALF (DRAPES) ×4 IMPLANT
DRAPE SURG 17X23 STRL (DRAPES) ×4 IMPLANT
DRSG MEPILEX BORDER 4X4 (GAUZE/BANDAGES/DRESSINGS) ×1 IMPLANT
DRSG MEPILEX BORDER 4X8 (GAUZE/BANDAGES/DRESSINGS) IMPLANT
DURAPREP 26ML APPLICATOR (WOUND CARE) ×2 IMPLANT
ELECT COATED BLADE 2.86 ST (ELECTRODE) ×2 IMPLANT
ELECT REM PT RETURN 9FT ADLT (ELECTROSURGICAL) ×2
ELECTRODE REM PT RTRN 9FT ADLT (ELECTROSURGICAL) ×1 IMPLANT
EVACUATOR 1/8 PVC DRAIN (DRAIN) IMPLANT
GLOVE BIOGEL PI IND STRL 7.0 (GLOVE) IMPLANT
GLOVE BIOGEL PI IND STRL 7.5 (GLOVE) ×1 IMPLANT
GLOVE BIOGEL PI INDICATOR 7.0 (GLOVE) ×1
GLOVE BIOGEL PI INDICATOR 7.5 (GLOVE) ×1
GLOVE ECLIPSE 7.0 STRL STRAW (GLOVE) ×2 IMPLANT
GLOVE ECLIPSE 8.5 STRL (GLOVE) ×2 IMPLANT
GLOVE ECLIPSE 9.0 STRL (GLOVE) ×1 IMPLANT
GLOVE SURG 8.5 LATEX PF (GLOVE) ×2 IMPLANT
GLOVE SURG SS PI 7.0 STRL IVOR (GLOVE) ×1 IMPLANT
GOWN STRL REUS W/ TWL LRG LVL3 (GOWN DISPOSABLE) ×2 IMPLANT
GOWN STRL REUS W/TWL 2XL LVL3 (GOWN DISPOSABLE) ×2 IMPLANT
GOWN STRL REUS W/TWL LRG LVL3 (GOWN DISPOSABLE) ×4
KIT BASIN OR (CUSTOM PROCEDURE TRAY) ×2 IMPLANT
KIT ROOM TURNOVER OR (KITS) ×2 IMPLANT
MANIFOLD NEPTUNE II (INSTRUMENTS) ×2 IMPLANT
NS IRRIG 1000ML POUR BTL (IV SOLUTION) ×2 IMPLANT
PACK ORTHO CERVICAL (CUSTOM PROCEDURE TRAY) ×2 IMPLANT
PAD ARMBOARD 7.5X6 YLW CONV (MISCELLANEOUS) ×4 IMPLANT
PATTIES SURGICAL .25X.25 (GAUZE/BANDAGES/DRESSINGS) ×2 IMPLANT
PATTIES SURGICAL .75X.75 (GAUZE/BANDAGES/DRESSINGS) ×2 IMPLANT
SPONGE GAUZE 4X4 12PLY (GAUZE/BANDAGES/DRESSINGS) ×1 IMPLANT
SPONGE LAP 18X18 X RAY DECT (DISPOSABLE) ×1 IMPLANT
SPONGE SURGIFOAM ABS GEL 100 (HEMOSTASIS) IMPLANT
SPONGE SURGIFOAM ABS GEL 12-7 (HEMOSTASIS) ×1 IMPLANT
SUT ETHIBOND CT1 BRD #0 30IN (SUTURE) ×2 IMPLANT
SUT VIC AB 0 CT1 27 (SUTURE)
SUT VIC AB 0 CT1 27XBRD ANBCTR (SUTURE) IMPLANT
SUT VIC AB 2-0 CT1 27 (SUTURE)
SUT VIC AB 2-0 CT1 TAPERPNT 27 (SUTURE) IMPLANT
SUT VIC AB 2-0 UR6 27 (SUTURE) ×1 IMPLANT
SUT VICRYL 0 UR6 27IN ABS (SUTURE) ×2 IMPLANT
SUT VICRYL 4-0 PS2 18IN ABS (SUTURE) ×2 IMPLANT
TOWEL OR 17X24 6PK STRL BLUE (TOWEL DISPOSABLE) ×2 IMPLANT
TOWEL OR 17X26 10 PK STRL BLUE (TOWEL DISPOSABLE) ×2 IMPLANT
TRAY FOLEY CATH 16FRSI W/METER (SET/KITS/TRAYS/PACK) IMPLANT
WATER STERILE IRR 1000ML POUR (IV SOLUTION) ×1 IMPLANT

## 2013-11-22 NOTE — Anesthesia Procedure Notes (Signed)
Procedure Name: Intubation Date/Time: 11/22/2013 12:05 PM Performed by: Carola Frost Pre-anesthesia Checklist: Patient identified, Timeout performed, Emergency Drugs available, Suction available and Patient being monitored Patient Re-evaluated:Patient Re-evaluated prior to inductionOxygen Delivery Method: Circle system utilized Preoxygenation: Pre-oxygenation with 100% oxygen Intubation Type: IV induction Ventilation: Mask ventilation without difficulty Grade View: Grade I Tube type: Oral Tube size: 7.5 mm Number of attempts: 1 Airway Equipment and Method: Video-laryngoscopy and Stylet Placement Confirmation: CO2 detector,  positive ETCO2,  ETT inserted through vocal cords under direct vision and breath sounds checked- equal and bilateral Secured at: 24 cm Tube secured with: Tape Dental Injury: Teeth and Oropharynx as per pre-operative assessment

## 2013-11-22 NOTE — Interval H&P Note (Signed)
History and Physical Interval Note:  11/22/2013 11:56 AM  Carlos Olson  has presented today for surgery, with the diagnosis of left C4-5 and C5-6 foraminal stenosis  The various methods of treatment have been discussed with the patient and family. After consideration of risks, benefits and other options for treatment, the patient has consented to  Procedure(s): LEFT C4-5 AND C5-6 FORAMINOTOMY (N/A) as a surgical intervention .  The patient's history has been reviewed, patient examined, no change in status, stable for surgery.  I have reviewed the patient's chart and labs.  Questions were answered to the patient's satisfaction.     Jessy Oto

## 2013-11-22 NOTE — Plan of Care (Signed)
Problem: Consults Goal: Diagnosis - Spinal Surgery Cervical Spine Fusion     

## 2013-11-22 NOTE — Transfer of Care (Signed)
Immediate Anesthesia Transfer of Care Note  Patient: Carlos Olson  Procedure(s) Performed: Procedure(s): LEFT C4-5 AND C5-6 FORAMINOTOMY (N/A)  Patient Location: PACU  Anesthesia Type:General  Level of Consciousness: awake, alert  and oriented  Airway & Oxygen Therapy: Patient Spontanous Breathing and Patient connected to nasal cannula oxygen  Post-op Assessment: Report given to PACU RN, Post -op Vital signs reviewed and stable and Patient moving all extremities X 4  Post vital signs: Reviewed and stable  Complications: No apparent anesthesia complications

## 2013-11-22 NOTE — Brief Op Note (Signed)
11/22/2013  2:33 PM  PATIENT:  Carlos Olson  73 y.o. 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 Glennon Mac.  EBL:  Total I/O In: 1000 [I.V.:1000] Out: 200 [Blood:200]  BLOOD ADMINISTERED:none  DRAINS: Urinary Catheter (Foley)   LOCAL MEDICATIONS USED:  MARCAINE 1/2% with 1/200,000 epi.and Amount: 10 ml Dr. Glennon Mac.  SPECIMEN:  No Specimen  DISPOSITION OF SPECIMEN:  N/A  COUNTS:  YES  TOURNIQUET:  * No tourniquets in log *  DICTATION: .Dragon Dictation  PLAN OF CARE: Admit for overnight observation  PATIENT DISPOSITION:  PACU - hemodynamically stable.   Delay start of Pharmacological VTE agent (>24hrs) due to surgical blood loss or risk of bleeding: yes

## 2013-11-22 NOTE — Progress Notes (Signed)
Orthopedic Tech Progress Note Patient Details:  Carlos Olson July 18, 1940 [id number] Patient already has soft collar. Patient ID: Carlos Olson, male   DOB: 05/26/1941, 73 y.o.   MRN: [id number]   Carlos Olson 11/22/2013, 8:19 PM

## 2013-11-22 NOTE — Anesthesia Preprocedure Evaluation (Addendum)
Anesthesia Evaluation  Patient identified by MRN, date of birth, ID band Patient awake    Reviewed: Allergy & Precautions, H&P , NPO status , Patient's Chart, lab work & pertinent test results  History of Anesthesia Complications Negative for: history of anesthetic complications  Airway Mallampati: II TM Distance: >3 FB Neck ROM: Full    Dental  (+) Edentulous Upper, Dental Advisory Given   Pulmonary COPD COPD inhaler, former smoker (quit '08),  breath sounds clear to auscultation  Pulmonary exam normal       Cardiovascular - anginanegative cardio ROS  Rhythm:Regular Rate:Normal     Neuro/Psych    GI/Hepatic negative GI ROS, Neg liver ROS,   Endo/Other  negative endocrine ROS  Renal/GU negative Renal ROS   H/o prostate cancer    Musculoskeletal   Abdominal   Peds  Hematology negative hematology ROS (+)   Anesthesia Other Findings   Reproductive/Obstetrics                          Anesthesia Physical Anesthesia Plan  ASA: II  Anesthesia Plan: General   Post-op Pain Management:    Induction: Intravenous  Airway Management Planned: Oral ETT and Video Laryngoscope Planned  Additional Equipment:   Intra-op Plan:   Post-operative Plan: Extubation in OR  Informed Consent: I have reviewed the patients History and Physical, chart, labs and discussed the procedure including the risks, benefits and alternatives for the proposed anesthesia with the patient or authorized representative who has indicated his/her understanding and acceptance.   Dental advisory given  Plan Discussed with: Surgeon and CRNA  Anesthesia Plan Comments: (Plan routine monitors, GETA )        Anesthesia Quick Evaluation

## 2013-11-22 NOTE — Anesthesia Postprocedure Evaluation (Signed)
  Anesthesia Post-op Note  Patient: Carlos Olson  Procedure(s) Performed: Procedure(s): LEFT C4-5 AND C5-6 FORAMINOTOMY (N/A)  Patient Location: PACU  Anesthesia Type:General  Level of Consciousness: awake, alert , oriented and patient cooperative  Airway and Oxygen Therapy: Patient Spontanous Breathing and Patient connected to nasal cannula oxygen  Post-op Pain: mild  Post-op Assessment: Post-op Vital signs reviewed, Patient's Cardiovascular Status Stable, Respiratory Function Stable, Patent Airway, No signs of Nausea or vomiting and Pain level controlled  Post-op Vital Signs: Reviewed and stable  Last Vitals:  Filed Vitals:   11/22/13 1549  BP: 127/63  Pulse: 64  Temp:   Resp: 14    Complications: No apparent anesthesia complications

## 2013-11-22 NOTE — Op Note (Signed)
11/22/2013  2:38 PM  PATIENT:  Carlos Olson  73 y.o. 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 Dr. Glennon Mac.     COMPLICATIONS:  None.  DRAINS: Foley in and out at the end of the case.      PROCEDURE:The patient was met in the holding area, and the appropriate cervical left C4-5 and C5-6 levels identified and marked with "x" and my initials. All questions were answered and informed consent signed.   The patient was then transported to OR. The patient was then placed under general anesthesia without difficulty and transferred to the operating room table prone position [place] horseshoe with chest rolls. All pressure points well-padded PAS stockings.. The patient received appropriate preoperative antibiotic prophylaxis.Time-out procedure was called and correct.   Sterile prep with DuraPrep and draped in the usual manner the shoulders were taped downwards and skin traction over the skin of the neck. Following DuraPrep draped in the usual manner. After timeout protocol incision was made approximately C4 to C6 in the midline. This following infiltration of skin and subcutaneous layers with marcaine 1% with 1-200,000 epinephrine total of 10 cc. Incision carried through skin and subcutaneous layers using 10 blade scalpel and electrocautery down to the level ligamentum nuchae. Incision made centered on the spinous process of C4-C6 towel clip then placed at the spinous process of C5 intraoperative cross table lateral  identified the clamp at the C5  level. Canning down one further spinous process then a single 0 Vicryl suture was used to mark the spinous process of C5. Electrocautery then used to carefully incise the cervical muscles off the left lateral aspect of the spinous process of C4-C5-C6 Carefully removing spinous muscles off of the inferior aspect lamina at C4 exposing the C4-5 posterior aspect of the interlaminar space. The magnification headlamp were used during this portion procedure. Boss McCollough retractor was inserted. High-speed bur was used to remove a small portion of bone from the inferior aspect of lamina of C4 and C5, and the medial 20% of the inferior articular process of C4 and C5. Further thinning the superior aspect of the lamina of C5 and C6. A 1 mm Kerrison was then used to remove ligamentum flavum from superior aspect of the lamina C5 and the medial aspect of the inferior articular process of C4 approximately 20% exposing the superior articular process of C5.  A 1 mm Kerrison was used to remove bone off the superior aspect of the lamina of C5 and then resecting 20% of the medial aspect of the superior articular process of C5. Ligamentum flavum then easily lifted andand electrocautery used to cauterize epidural veins deep to  the ligamentum flavum and the fibrovascular leash overlying the C5 nerve root  was then resected. The operating room microscope was draped sterilely and brought into the field. Under the operating room microscope the epidural vein layer overlying the posterior aspect of the thecal sac and the C5  nerve root was then carefully lifted using a micro-titanium were cauterized using bipolar electrocautery the 15 blade scalpel then  used to incise this overlying the C5  nerve root releasing the vascular leash a forward angle 3-0 microcurette then used to remove a small portion of bone off the superior and medial aspect of the pedicle further mobilizing the C5 nerve root bipolar electrocautery to control all bleeding  within the axillary area of the C5  nerve. Bone wax was applied to bleeding cancellus bone surfaces are excellent hemostasis obtained for C5 nerve root was tracked inferiorly and laterally with nerve hook. The disc explored using a Penfield 4 found to be a hard  spur no disc material extruded  and the posterior mass effect was felt to represent uncovertebral spur. Following this then hemostasis was obtained using thrombin-soaked Gelfoam and micro-pledgettes. When complete hemostasis was obtained all trial was removed I nerve hook could be easily passed out the neuroforamen without the lateral aspect of the C5  pedicle demonstrate the C5 neuroforamen completely decompressed. A 1 mm Kerrison was then used to remove ligamentum flavum from superior aspect of the lamina C6 and the medial aspect of the inferior articular process of C5 approximately 20% exposing the superior articular process of C6.  A 1 mm Kerrison was used to remove bone off the superior aspect of the lamina of C6 and then resecting 20% of the medial aspect of the superior articular process of C6. Ligamentum flavum then easily lifted andand electrocautery used to cauterize epidural veins deep to  the ligamentum flavum and the fibrovascular leash overlying the C6 nerve root  was then resected. The operating room microscope was draped sterilely and brought into the field. Under the operating room microscope the epidural vein layer overlying the posterior aspect of the thecal sac and the C6  nerve root was then carefully lifted using a micro-titanium were cauterized using bipolar electrocautery the 15 blade scalpel then used to incise this overlying the C6  nerve root releasing the vascular leash a forward angle 3-0 microcurette then used to remove a small portion of bone off the superior and medial aspect of the pedicle further mobilizing the C6 nerve root bipolar electrocautery to control all bleeding within the axillary area and C6  nerve. Bone wax was  applied to bleeding cancellus bone surfaces are excellent hemostasis obtained for C6  nerve root was tracked superiorly and laterally nerve hook. The obvious posterior mass effect was felt to represent uncovertebral spur. Following this then hemostasis was obtained using thrombin-soaked Gelfoam and micro-pledgettes. When complete hemostasis was obtained all trial was removed I nerve hook could be easily passed out the neuroforamen without the lateral aspect of the C6  pedicle demonstrate the C6  neuroforamen completely decompressed. Irrigation was carried out no active bleeding was present. Following further irrigation and the incision was closed by approximating the ligamentum nuchae with 0 Ethibond sutures. The subcutaneous layers approximated with interrupted 0 Vicryl suture more superficial layers with interrupted 2-0 Vicryl sutures and the skin closed with interrupted 4-0 Vicryl sutures. Dermabond was applied then MedPlex bandage. Soft cervical collar all instrument and sponge counts were correct. Patient was then returned to supine position on his stretcher. In and out foley catheter was preformed at the end of the procedure. Soft cervical collar was applied. The patient reactived and returned to recovery room in satisfactory condition.  Physician assistant's responsibilities: Phillips Hay PA-C perform the duties of assistant physician and surgeon during this case present from the beginning of the case to the end of the case. She assisted with careful retraction of neural structures  suctioning about her elements including cervical cord and C5 and C6 nerve root. Performed closure of the incision on the ligamentum nuchae to the skin and application of dressing. She assisted in positioning the patient had removal the patient from the OR table to her stretcher.        Jessy Oto  11/22/2013, 2:38 PM

## 2013-11-23 NOTE — Progress Notes (Signed)
D/C instructions and scripts given. Pt verbalized understanding of instructions. IV D/Cd and Pts wife and daughter a the bedside to take Pt home.

## 2013-11-23 NOTE — Discharge Summary (Signed)
Patient d/c summary note and lab reviewed.  

## 2013-11-23 NOTE — Progress Notes (Signed)
Subjective: 1 Day Post-Op Procedure(s) (LRB): LEFT C4-5 AND C5-6 FORAMINOTOMY (N/A) Patient reports pain as mild.    Objective: Vital signs in last 24 hours: Temp:  [97 F (36.1 C)-98.7 F (37.1 C)] 98.3 F (36.8 C) (05/27 0521) Pulse Rate:  [59-89] 68 (05/27 0521) Resp:  [9-22] 18 (05/27 0521) BP: (101-135)/(51-87) 113/60 mmHg (05/27 0521) SpO2:  [91 %-99 %] 97 % (05/27 0521) Weight:  [96.616 kg (213 lb)] 96.616 kg (213 lb) (05/26 0959)  Intake/Output from previous day: 05/26 0701 - 05/27 0700 In: 2410 [P.O.:960; I.V.:1450] Out: 850 [Urine:650; Blood:200] Intake/Output this shift: Total I/O In: 797.5 [I.V.:797.5] Out: -   No results found for this basename: HGB,  in the last 72 hours No results found for this basename: WBC, RBC, HCT, PLT,  in the last 72 hours No results found for this basename: NA, K, CL, CO2, BUN, CREATININE, GLUCOSE, CALCIUM,  in the last 72 hours No results found for this basename: LABPT, INR,  in the last 72 hours  Neurovascular intact Sensation intact distally Incision: dressing C/D/I  Assessment/Plan: 1 Day Post-Op Procedure(s) (LRB): LEFT C4-5 AND C5-6 FORAMINOTOMY (N/A) Advance diet D/C IV fluids DC home OV 2 weeks rx percocet and robaxin  Collar as needed  Epimenio Foot 11/23/2013, 8:28 AM

## 2013-11-23 NOTE — Progress Notes (Signed)
Utilization review completed.  

## 2013-11-23 NOTE — Discharge Summary (Signed)
Physician Discharge Summary  Patient ID: Carlos Olson MRN: [id number] DOB/AGE: 06-Jul-1940 10 y.o.  Admit date: 11/22/2013 Discharge date: 11/23/2013  Admission Diagnoses:  Spondylosis, cervical, with myelopathy C4-5 and C5-6  Discharge Diagnoses:  Principal Problem:   Spondylosis, cervical, with myelopathy Active Problems:   Spinal stenosis in cervical region   Past Medical History  Diagnosis Date  . History of prostate cancer   . COPD (chronic obstructive pulmonary disease)   . Diverticulosis of colon   . Pelvic fracture   . IBS (irritable bowel syndrome)     improved after retirement  . Colon polyps   . Osteoarthritis     knees and cervical/lumbar spine s/p surgeries and injections  . Femoral bruit     bilateral, found by Dr. Jefm Bryant  . Heart murmur     child  . Shortness of breath   . Pneumonia     child  . Cancer     s/p prostate    Surgeries: Procedure(s): LEFT C4-5 AND C5-6 FORAMINOTOMY on 11/22/2013   Consultants (if any):  none  Discharged Condition: Improved  Hospital Course: Carlos Olson is an 73 y.o. male who was admitted 11/22/2013 with a diagnosis of Spondylosis, cervical, with myelopathy and went to the operating room on 11/22/2013 and underwent the above named procedures.    He was given perioperative antibiotics:  Anti-infectives   Start     Dose/Rate Route Frequency Ordered Stop   11/22/13 1800  ceFAZolin (ANCEF) IVPB 1 g/50 mL premix     1 g 100 mL/hr over 30 Minutes Intravenous  Once 11/22/13 1656 11/22/13 1918   11/22/13 1018  ceFAZolin (ANCEF) 2-3 GM-% IVPB SOLR    Comments:  [name]   : cabinet override      11/22/13 1018 11/22/13 2229   11/22/13 0600  ceFAZolin (ANCEF) IVPB 2 g/50 mL premix     2 g 100 mL/hr over 30 Minutes Intravenous On call to O.R. 11/21/13 1309 11/22/13 1220    .  He was given sequential compression devices, early ambulation for DVT prophylaxis.  He benefited maximally from the hospital stay and  there were no complications.    Recent vital signs:  Filed Vitals:   11/23/13 0521  BP: 113/60  Pulse: 68  Temp: 98.3 F (36.8 C)  Resp: 18    Recent laboratory studies:  Lab Results  Component Value Date   HGB 15.2 11/16/2013   HGB 15.0 07/23/2011   HGB 15.1 05/08/2011   Lab Results  Component Value Date   WBC 5.8 11/16/2013   PLT 208 11/16/2013   No results found for this basename: INR   Lab Results  Component Value Date   NA 141 11/16/2013   K 4.6 11/16/2013   CL 104 11/16/2013   CO2 26 11/16/2013   BUN 14 11/16/2013   CREATININE 0.86 11/16/2013   GLUCOSE 96 11/16/2013    Discharge Medications:     Medication List         acetaminophen 500 MG tablet  Commonly known as:  TYLENOL  Take 500 mg by mouth every 6 (six) hours as needed (pain).     methocarbamol 500 MG tablet  Commonly known as:  ROBAXIN  Take 1 tablet (500 mg total) by mouth every 8 (eight) hours as needed for muscle spasms (spasm).     oxyCODONE-acetaminophen 5-325 MG per tablet  Commonly known as:  ROXICET  Take 1-2 tablets by mouth every 4 (four) hours as needed  for moderate pain.     tiotropium 18 MCG inhalation capsule  Commonly known as:  SPIRIVA  Place 1 capsule (18 mcg total) into inhaler and inhale daily.        Diagnostic Studies: Dg Chest 2 View  11/16/2013   CLINICAL DATA:  Preoperative before its cervical spine surgery  EXAM: CHEST  2 VIEW  COMPARISON:  DG CHEST 2 VIEW dated 07/17/2011  FINDINGS: Stable bilateral pulmonary hyperinflation with hemidiaphragm flattening. No focal infiltrate. The heart and mediastinal structures are normal. Tortuosity of the descending thoracic aorta. No pleural effusion. Mild degenerative disc narrowing at multiple thoracic levels.  IMPRESSION: COPD.  No active cardiopulmonary disease.   Electronically Signed   By: David  Martinique   On: 11/16/2013 14:17   Dg Cervical Spine 1 View  11/22/2013   CLINICAL DATA:  Status post foraminotomy  EXAM: DG CERVICAL SPINE -  1 VIEW  COMPARISON:  July 29, 2011  FINDINGS: There is a probe with its tip overlying the C5 spinous process. The patient is status post anterior fusion at C3-4 with screw and plate fixation present at C3-4 anteriorly and a bony plug at C3-4. There is no fracture or spondylolisthesis in the regions imaged.  IMPRESSION: Postoperative change anteriorly at C3-4. Probe tip is overlying the spinous process of C5.   Electronically Signed   By: Lowella Grip M.D.   On: 11/22/2013 14:43    Disposition: 01-Home or Self Care      Discharge Instructions   Call MD / Call 911    Complete by:  As directed   If you experience chest pain or shortness of breath, CALL 911 and be transported to the hospital emergency room.  If you develope a fever above 101 F, pus (white drainage) or increased drainage or redness at the wound, or calf pain, call your surgeon's office.     Constipation Prevention    Complete by:  As directed   Drink plenty of fluids.  Prune juice may be helpful.  You may use a stool softener, such as Colace (over the counter) 100 mg twice a day.  Use MiraLax (over the counter) for constipation as needed.     Diet - low sodium heart healthy    Complete by:  As directed      Discharge instructions    Complete by:  As directed   Wear soft collar as needed for pain.  Ice packs to wound for comfort.  Change dressing daily or as needed. Avoid overhead use of arms.  May shower in 3 days if no wound drainage.  Keep incision covered for 2 weeks     Driving restrictions    Complete by:  As directed   No driving for 2 weeks     Increase activity slowly as tolerated    Complete by:  As directed      Lifting restrictions    Complete by:  As directed   No lifting for 6 weeks           Follow-up Information   Follow up with NITKA,JAMES E, MD In 2 weeks.   Specialty:  Orthopedic Surgery   Contact information:   [place] [address] [phone]         Signed: Epimenio Foot 11/23/2013, 8:31 AM

## 2013-11-24 ENCOUNTER — Encounter (HOSPITAL_COMMUNITY): Payer: Self-pay | Admitting: Specialist

## 2014-08-30 ENCOUNTER — Other Ambulatory Visit: Payer: Self-pay | Admitting: Family Medicine

## 2014-08-30 DIAGNOSIS — E785 Hyperlipidemia, unspecified: Secondary | ICD-10-CM

## 2014-08-30 DIAGNOSIS — Z8546 Personal history of malignant neoplasm of prostate: Secondary | ICD-10-CM

## 2014-09-01 ENCOUNTER — Other Ambulatory Visit (INDEPENDENT_AMBULATORY_CARE_PROVIDER_SITE_OTHER): Payer: Medicare Other

## 2014-09-01 DIAGNOSIS — Z8546 Personal history of malignant neoplasm of prostate: Secondary | ICD-10-CM

## 2014-09-01 DIAGNOSIS — E785 Hyperlipidemia, unspecified: Secondary | ICD-10-CM

## 2014-09-01 LAB — BASIC METABOLIC PANEL
BUN: 13 mg/dL (ref 6–23)
CALCIUM: 9.5 mg/dL (ref 8.4–10.5)
CHLORIDE: 106 meq/L (ref 96–112)
CO2: 31 meq/L (ref 19–32)
CREATININE: 0.95 mg/dL (ref 0.40–1.50)
GFR: 82.29 mL/min (ref >60.00)
GLUCOSE: 100 mg/dL — AB (ref 70–99)
Potassium: 4.7 mEq/L (ref 3.5–5.1)
Sodium: 140 mEq/L (ref 135–145)

## 2014-09-01 LAB — LIPID PANEL
CHOLESTEROL: 158 mg/dL (ref 0–200)
HDL: 54.2 mg/dL (ref >39.00)
LDL Cholesterol: 90 mg/dL (ref 0–99)
NonHDL: 103.8
TRIGLYCERIDES: 70 mg/dL (ref 0.0–149.0)
Total CHOL/HDL Ratio: 3
VLDL: 14 mg/dL (ref 0.0–40.0)

## 2014-09-01 LAB — PSA: PSA: 0.01 ng/mL — ABNORMAL LOW (ref 0.10–4.00)

## 2014-09-08 ENCOUNTER — Encounter: Payer: Self-pay | Admitting: Family Medicine

## 2014-09-08 ENCOUNTER — Ambulatory Visit (INDEPENDENT_AMBULATORY_CARE_PROVIDER_SITE_OTHER): Payer: Medicare Other | Admitting: Family Medicine

## 2014-09-08 VITALS — BP 136/78 | HR 72 | Temp 97.7°F | Ht 75.5 in | Wt 212.5 lb

## 2014-09-08 DIAGNOSIS — Z01810 Encounter for preprocedural cardiovascular examination: Secondary | ICD-10-CM | POA: Insufficient documentation

## 2014-09-08 DIAGNOSIS — J449 Chronic obstructive pulmonary disease, unspecified: Secondary | ICD-10-CM

## 2014-09-08 DIAGNOSIS — Z0000 Encounter for general adult medical examination without abnormal findings: Secondary | ICD-10-CM

## 2014-09-08 DIAGNOSIS — Z23 Encounter for immunization: Secondary | ICD-10-CM

## 2014-09-08 DIAGNOSIS — Z7189 Other specified counseling: Secondary | ICD-10-CM

## 2014-09-08 DIAGNOSIS — Z8546 Personal history of malignant neoplasm of prostate: Secondary | ICD-10-CM

## 2014-09-08 MED ORDER — TIOTROPIUM BROMIDE MONOHYDRATE 18 MCG IN CAPS: 18.0000 ug | ORAL_CAPSULE | Freq: Every day | RESPIRATORY_TRACT | Status: DC

## 2014-09-08 NOTE — Assessment & Plan Note (Signed)
Advanced directives: has at home. Asked him to bring [place] a copy. Wife is HCPOA.

## 2014-09-08 NOTE — Progress Notes (Signed)
BP 136/78 mmHg  Pulse 72  Temp(Src) 97.7 F (36.5 C) (Oral)  Ht 6' 3.5" (1.918 m)  Wt 212 lb 8 oz (96.389 kg)  BMI 26.20 kg/m2   CC: medicare wellness visit  Subjective:    Patient ID: Carlos Olson, male    DOB: 1941-02-18, 74 y.o.   MRN: [id number]  HPI: Carlos Olson is a 74 y.o. male presenting on 09/08/2014 for Annual Exam   Pleasant 74 yo with h/o prostate cancer s/p prostatectomy, HLD, COPD well controlled on spiriva, former smoker (quit 2008) presents for medicare wellness visit.   Dub Mikes (brother) passed away 10-Mar-2014.  Takes spiriva PRN. Has only filled once. Requests refill.   Posterior cervical fusion 10/2013 by Dr Louanne Skye. Initial improvement, but persistent paresthesias of L hand.   Hearing screen - trouble out of right ear. Has hearing aides but doesn't use. Not very helpful.  Vision screen passed. Due for vision screen.  Denies depression, anhedonia, sadness.  No falls in last year.   Preventative: Colon 02/2013 - tortuous colon, diverticulosis and polyp, rpt 5 yrs ([facility]) Prostate cancer - s/p prostatectomy 1990s Flu shot 03/2014 Td 2010  Pneumovax 2013, prevnar today zostavax 2012  Advanced directives: has at home. Asked him to bring [place] a copy. Wife is HCPOA.  Prefers I call him "Carlos Olson" Lives with wife. Grown children  Occupation: retired, was in Insurance underwriter business  Activity: hates walking, R knee limits activity. Stretches knee regularly.  Diet: some water, vegetables daily  Relevant past medical, surgical, family and social history reviewed and updated as indicated. Interim medical history since our last visit reviewed. Allergies and medications reviewed and updated. Current Outpatient Prescriptions on File Prior to Visit  Medication Sig  . acetaminophen (TYLENOL) 500 MG tablet Take 500 mg by mouth every 6 (six) hours as needed (pain).    No current facility-administered medications on file prior to visit.    Review of Systems    Constitutional: Negative for fever, chills, activity change, appetite change, fatigue and unexpected weight change.  HENT: Negative for hearing loss.   Eyes: Positive for visual disturbance (some fuzzy vision in am).  Respiratory: Positive for shortness of breath (mainly with pollen treated with spiriva prn.). Negative for cough, chest tightness and wheezing.   Cardiovascular: Negative for chest pain, palpitations and leg swelling.  Gastrointestinal: Negative for nausea, vomiting, abdominal pain, diarrhea, constipation, blood in stool and abdominal distention.  Genitourinary: Negative for hematuria and difficulty urinating.  Musculoskeletal: Negative for myalgias, arthralgias and neck pain.  Skin: Negative for rash.  Neurological: Negative for dizziness, seizures, syncope and headaches.  Hematological: Negative for adenopathy. Does not bruise/bleed easily.  Psychiatric/Behavioral: Negative for dysphoric mood. The patient is not nervous/anxious.    Per HPI unless specifically indicated above     Objective:    BP 136/78 mmHg  Pulse 72  Temp(Src) 97.7 F (36.5 C) (Oral)  Ht 6' 3.5" (1.918 m)  Wt 212 lb 8 oz (96.389 kg)  BMI 26.20 kg/m2  Wt Readings from Last 3 Encounters:  09/08/14 212 lb 8 oz (96.389 kg)  11/22/13 213 lb (96.616 kg)  06/24/13 215 lb 8 oz (97.75 kg)    Physical Exam  Constitutional: He is oriented to person, place, and time. He appears well-developed and well-nourished. No distress.  HENT:  Head: Normocephalic and atraumatic.  Right Ear: Hearing, tympanic membrane, external ear and ear canal normal.  Left Ear: Hearing, tympanic membrane, external ear and ear canal normal.  Nose:  Nose normal.  Mouth/Throat: Uvula is midline, oropharynx is clear and moist and mucous membranes are normal. No oropharyngeal exudate, posterior oropharyngeal edema or posterior oropharyngeal erythema.  Eyes: Conjunctivae and EOM are normal. Pupils are equal, round, and reactive to light.  No scleral icterus.  Neck: Normal range of motion. Neck supple. No thyromegaly present.  Cardiovascular: Normal rate, regular rhythm, normal heart sounds and intact distal pulses.   No murmur heard. Pulses:      Radial pulses are 2+ on the right side, and 2+ on the left side.  Pulmonary/Chest: Effort normal and breath sounds normal. No respiratory distress. He has no wheezes. He has no rales.  Abdominal: Soft. Bowel sounds are normal. He exhibits no distension and no mass. There is no tenderness. There is no rebound and no guarding.  Musculoskeletal: Normal range of motion. He exhibits no edema.  Lymphadenopathy:    He has no cervical adenopathy.  Neurological: He is alert and oriented to person, place, and time.  CN grossly intact, station and gait intact Recall 3/3  Calculation 5/5 serial 7s  Skin: Skin is warm and dry. No rash noted.  Psychiatric: He has a normal mood and affect. His behavior is normal. Judgment and thought content normal.  Nursing note and vitals reviewed.  Results for orders placed or performed in visit on 09/01/14  Lipid panel  Result Value Ref Range   Cholesterol 158 0 - 200 mg/dL   Triglycerides 70.0 0.0 - 149.0 mg/dL   HDL 54.20 >39.00 mg/dL   VLDL 14.0 0.0 - 40.0 mg/dL   LDL Cholesterol 90 0 - 99 mg/dL   Total CHOL/HDL Ratio 3    NonHDL 103.80   PSA  Result Value Ref Range   PSA 0.01 (L) 0.10 - 4.00 ng/mL  Basic metabolic panel  Result Value Ref Range   Sodium 140 135 - 145 mEq/L   Potassium 4.7 3.5 - 5.1 mEq/L   Chloride 106 96 - 112 mEq/L   CO2 31 19 - 32 mEq/L   Glucose, Bld 100 (H) 70 - 99 mg/dL   BUN 13 6 - 23 mg/dL   Creatinine, Ser 0.95 0.40 - 1.50 mg/dL   Calcium 9.5 8.4 - 10.5 mg/dL   GFR 82.29 >60.00 mL/min      Assessment & Plan:   Problem List Items Addressed This Visit    PROSTATE CANCER, HX OF    PSA reassuring.       Medicare annual wellness visit, subsequent - Primary    I have personally reviewed the Medicare Annual  Wellness questionnaire and have noted 1. The patient's medical and social history 2. Their use of alcohol, tobacco or illicit drugs 3. Their current medications and supplements 4. The patient's functional ability including ADL's, fall risks, home safety risks and hearing or visual impairment. 5. Diet and physical activity 6. Evidence for depression or mood disorders The patients weight, height, BMI have been recorded in the chart.  Hearing and vision has been addressed. I have made referrals, counseling and provided education to the patient based review of the above and I have provided the pt with a written personalized care plan for preventive services. Provider list updated - see scanned questionairre. Reviewed preventative protocols and updated unless pt declined.       Health maintenance examination    Preventative protocols reviewed and updated unless pt declined. Discussed healthy diet and lifestyle.       COPD (chronic obstructive pulmonary disease)    Presumed  from long smoking history. Prn rare use of spiriva. Refilled today.      Relevant Medications   tiotropium (SPIRIVA) 18 MCG inhalation capsule   Advanced care planning/counseling discussion    Advanced directives: has at home. Asked him to bring [place] a copy. Wife is HCPOA.          Follow up plan: Return in about 1 year (around 09/08/2015), or as needed, for medicare wellness.

## 2014-09-08 NOTE — Assessment & Plan Note (Signed)
Preventative protocols reviewed and updated unless pt declined. Discussed healthy diet and lifestyle.  

## 2014-09-08 NOTE — Patient Instructions (Addendum)
prevnar today. Bring me copy of living will to update chart. Return as needed or in 1 year for next wellness visit.

## 2014-09-08 NOTE — Assessment & Plan Note (Signed)

## 2014-09-08 NOTE — Assessment & Plan Note (Signed)
PSA reassuring. 

## 2014-09-08 NOTE — Addendum Note (Signed)
Addended by: Royann Shivers A on: 09/08/2014 12:28 PM   Modules accepted: Orders

## 2014-09-08 NOTE — Assessment & Plan Note (Signed)
Presumed from long smoking history. Prn rare use of spiriva. Refilled today.

## 2014-09-08 NOTE — Progress Notes (Signed)
Pre visit review using our clinic review tool, if applicable. No additional management support is needed unless otherwise documented below in the visit note. 

## 2014-10-20 NOTE — Op Note (Signed)
PATIENT NAME:  Carlos Olson, Carlos Olson MR#:  [id number] DATE OF BIRTH:  Dec 09, 1940  DATE OF PROCEDURE:  08/18/2012  PREOPERATIVE DIAGNOSIS:  Degenerative arthrosis of the right knee.   POSTOPERATIVE DIAGNOSIS:  Degenerative arthrosis of the right knee.   PROCEDURE PERFORMED:  Right total knee arthroplasty using computer-assisted navigation.   SURGEON:  Dr. Laurice Record. Hooten.   ASSISTANT:  Vance Peper, PA.   ANESTHESIA:  Femoral nerve block and spinal.   ESTIMATED BLOOD LOSS:  75 mL.   FLUIDS REPLACED:  2000 mL of crystalloid.   TOURNIQUET TIME:  115 minutes.   DRAINS:  Two medium drains to reinfusion system.   SOFT TISSUE RELEASES:  Anterior cruciate ligament, posterior cruciate ligament, deep and superficial medial collateral ligament, patellofemoral ligament.   IMPLANTS UTILIZED:  DePuy PFC Sigma size 6 posterior stabilized femoral component (cemented), size 6 MBT tibial component (cemented), 41 mm 3 peg oval dome patella (cemented), and a 10 mm stabilized rotating platform polyethylene insert.    INDICATIONS FOR SURGERY:  The patient is a 74 year old gentleman who has been seen for complaints of progressive right knee pain.  X-rays demonstrated severe degenerative changes in tricompartmental fashion with relative varus deformity.  After discussion of risks and benefits of surgical intervention, the patient expressed understanding of the risks and benefits and agreed with plans for surgical intervention.   PROCEDURE IN DETAIL:  The patient was brought into the operating room, and after adequate femoral nerve block and spinal anesthesia was achieved, a tourniquet was placed on the patient's upper right thigh.  The patient's right knee and leg were cleaned and prepped with alcohol and DuraPrep, draped in the usual sterile fashion.  A "timeout" was performed as per usual protocol.  The right lower extremity was exsanguinated using an Esmarch and tourniquet was inflated to 300 mmHg.  An anterior  longitudinal incision was made followed by a standard mid vastus approach.  A moderate bloody effusion was evacuated.  The deep fibers of the medial collateral ligament were elevated in a subperiosteal fashion off the medial flare of the tibia so as to maintain a continuous soft tissue sleeve.  The patella was subluxed laterally and the patellofemoral ligament was incised.  Inspection of the knee demonstrated severe degenerative changes in tricompartmental fashion with gross irregularity of the articular surface with full thickness loss of articular cartilage.  Prominent osteophytes were debrided using a rongeur.  Anterior and posterior cruciate ligaments were excised.  Two 4.0 mm Schanz pins were inserted into the femur and into the tibia for attachment of the array of trackers used for computer-assisted navigation.  Hip center was identified using circumduction technique.  Distal landmarks were mapped using the computer.  Distal femur and proximal tibia were mapped using the computer.  Distal femoral cutting guide was positioned using computer-assisted navigation so as to achieve 5 degree distal valgus cut.  Cut was performed and verified using the computer.  Distal femur was sized and it was felt that a size 6 femoral component was appropriate.  A size 6 cutting guide was positioned and anterior cut was performed and verified using the computer.  This was followed by completion of the posterior and chamfer cuts.  Femoral cutting guide for central box was then positioned and the central box cut was performed.   Attention was then directed to proximal tibia.  Medial and lateral menisci were excised.  The extramedullary tibial cutting guide was positioned using computer-assisted navigation so as to achieve 0 degrees varus  valgus alignment and 0 degree posterior slope.  Cut was performed and verified using the computer.  Proximal tibia was sized and it was felt that a size 6 tibial tray was appropriate.  Tibial and  femoral trials were inserted followed by insertion of a 10 mm polyethylene trial.  The knee was felt to be tight both in flexion and in extension.  The femoral trial was removed and the extramedullary tibial cutting guide was repositioned so as to remove an additional 2 mm of bone proximally.  This was performed and verified using the computer.  Tibial and femoral trials were inserted followed by insertion of a 10 mm polyethylene insert.  Excellent mediolateral soft tissue balancing was appreciated both in full extension and in flexion.  Finally, the patella was cut and prepared so as to accommodate a 41 mm 3 peg oval dome patella.  Patellar trial was placed and the knee was placed through a range of motion with excellent patellar tracking appreciated.   Femoral trial was removed after debridement of posterior osteophytes.  Central post hole for the tibial component was reamed followed by insertion of a keel punch.  Tibial tray was then removed.  Cut surfaces of bone were irrigated with copious amounts of normal saline with antibiotic solution using pulsatile lavage and then suctioned dry.  Polymethyl methacrylate cement was prepared in the usual fashion using a vacuum mixer.  Cement was applied to the cut surface of the proximal tibia as well as along the undersurface of a size 6 MBT tibial component.  The tibial component was positioned and impacted in place.  Excess cement was removed using freer elevators.  Cement was applied to the cut surface of the femur as well as along the posterior flanges of size 6 posterior stabilized femoral component.  Femoral component was positioned and impacted in place.  Excess cement was removed using freer elevators.  A 10 mm polyethylene trial was inserted and the knee was brought into full extension with steady axial compression applied.  Finally, cement was applied to backside of a 41 mm 3 peg oval dome patella and the patellar component was positioned and patellar clamp  applied.  Excess cement was removed using freer elevators.    After adequate curing of cement, the tourniquet was deflated after a total tourniquet time of 115 minutes.  Hemostasis was achieved using electrocautery.  The knee was irrigated with copious amounts of normal saline with antibiotic solution using pulsatile lavage and then suctioned dry.  The knee was inspected for any residual cement debris.  30 mL of 0.2% Marcaine with epinephrine was injected along the posterior capsule.  A 10 mm stabilized rotating platform polyethylene insert was inserted and the knee was placed through range of motion with excellent patellar tracking appreciated and excellent mediolateral soft tissue balancing noted.  Two medium drains were placed in the wound bed and brought out through a separate stab incision to be attached to a reinfusion system.  The medial parapatellar portion of the incision was reapproximated using interrupted sutures of #1 Vicryl.  The subcutaneous tissue was approximated in layers using first #0 Vicryl followed by #2-0 Vicryl.  Skin was closed with skin staples.  A sterile dressing was applied.    The patient tolerated the procedure well.  He was transported to the recovery room in stable condition.     ____________________________ Laurice Record. Holley Bouche., MD jph:ea D: 08/18/2012 22:29:22 ET T: 08/19/2012 06:31:20 ET JOB#: [id number]  cc: Laurice Record. Hooten  Brooke Bonito., MD, <Dictator> Marciano Sequin MD ELECTRONICALLY SIGNED 08/22/2012 15:13

## 2014-10-20 NOTE — Discharge Summary (Signed)
PATIENT NAME:  Carlos Olson, SPEAS MR#:  [id number] DATE OF BIRTH:  12-18-1940  DATE OF ADMISSION:  08/18/2012. DATE OF DISCHARGE:  08/21/2012.   Vance Peper, PA, dictating a discharge summary for Dr. Skip Estimable.  ADMITTING DIAGNOSIS: Degenerative arthrosis of the right knee.   DISCHARGE DIAGNOSIS: Degenerative arthrosis of the right knee.   HISTORY OF PRESENT ILLNESS: The patient is a 74 year old who has been followed at [facility] for progression of right knee pain. He had underwent a right knee arthroscopy for a partial medial meniscectomy as well as chondroplasty last year and at which time he was noted to have grade III and grade IV changes with chondromalacia to the medial and lateral as well as a patellofemoral articulation. The patient reported progressive right knee pain especially on the medial aspect of the knee. He was having difficulty arising from a sitting position as well as ascending and descending steps. At the time of surgery, he was not using any ambulatory aid. The patient did report some mild swelling of the knee. He had not seen any significant improvement in his condition despite the use of Tylenol, as well as a series of Synvisc injections. The patient states that his pain had increased to the point that it was significantly interfering with his activities of daily living. X-rays taken in [place] showed narrowing of the medial cartilage space with associated varus alignment. Osteophyte formation as well as subchondral sclerosis was noted. He was noted to have degenerative changes, tricompartmental. After a discussion of the risks and benefits of surgical intervention, the patient expressed his understanding of the risks and benefits and agreed for surgical intervention.   PROCEDURE: Right total knee arthroplasty using computer-assisted navigation.   ANESTHESIA: Femoral nerve block with spinal.   Soft tissue release anterior cruciate ligament, posterior  cruciate ligament, deep and superficial medial collateral ligaments, as well as the patellofemoral ligament.   IMPLANTS UTILIZED: DePuy PFC Sigma size 6 posterior stabilized femoral component (cemented), size 6 MBT tibial component (cemented), 41-mm 3-pegged oval dome patella (cemented), and a 10-mm stabilized rotating platform polyethylene insert.   HOSPITAL COURSE: The patient tolerated the procedure very well. He had no complications. He was then taken to the PAC-U where he was started on Lovenox 30 mg subcutaneous every 12 hours per Anesthesia and Pharmacy protocol. He was fitted with TED stockings bilaterally. These were allowed to be removed 1 hour per 8 hour shift. The right one was on day 2 following removal of the Hemovac and dressing change. He was also fitted with AVI compression foot pumps bilaterally set at 80 mmHg. His calves have been nontender. There has been no evidence of any DVTs. The heels were elevated off the bed using rolled towels.   The patient has denied any chest pain or shortness of breath during the hospital stay. His vital signs have been stable. He has been afebrile. Hemodynamically he was stable. No transfusions were given other than the AutoVac transfusion given the first 6 hours postoperatively.   Physical Therapy was initiated on day 1 for gait training and transfers. He has done very well. Upon being discharged was ambulating greater than 200 feet. He was able go up and down 4 sets of steps. He was independent with bed to chair transfers. Occupational Therapy was also initiated on day 1 for ADL and assistive devices. He has progressed very nicely with therapy.   The patient's IV, Foley and Hemovac were discontinued on day 2 along with a  dressing change. The wound was free of any drainage or signs of infection. The Polar Care was reapplied to the surgical leg to maintain a temperature of 40 to 50 degrees Fahrenheit.   DISPOSITION:  1. The patient is being discharged to  home in improved stable condition.  2. Weight bear as tolerated.  3. Continue to use a walker until cleared by Physical Therapy to switch to a quad cane.  4. He will receive Home Health PT.  5. Continue TED stockings. These are to be worn during the day but may be removed at night.  6. Continue Polar Care maintaining a temperature of 40 to 50 degrees Fahrenheit.  7. He is placed on a regular diet.  8. He has a followup appointment in the clinic in 2 weeks.  9. He is to call us sooner if any temperatures of 101.5 or greater or excessive bleeding.  10. He is to resume his regular medications that he was on prior to admission.  11. He was given a prescription for Lovenox 40 mg subcutaneous daily for 14 days and then discontinue and begin taking one 81 mg enteric-coated aspirin.  12. A prescription for Roxicodone 5 to 10 mg every 4 to 6 hours p.r.n. for pain was given.  13. A prescription for tramadol 50 to 100 mg every 4 to 6 hours p.r.n. for pain was given.   PAST MEDICAL HISTORY: Prostate cancer.    ____________________________ Vance Peper, PA jrw:jm D: 08/20/2012 07:40:37 ET T: 08/20/2012 11:49:14 ET JOB#: [id number]  cc: Vance Peper, PA, <Dictator> JON WOLFE PA ELECTRONICALLY SIGNED 08/22/2012 18:27

## 2014-10-22 NOTE — Op Note (Signed)
PATIENT NAME:  Carlos Olson, Carlos Olson MR#:  [id number] DATE OF BIRTH:  [birth year]  DATE OF PROCEDURE:  11/14/2011  PREOPERATIVE DIAGNOSIS: Internal derangement of the right knee.   POSTOPERATIVE DIAGNOSES:  1. Tear of the posterior horn of the medial meniscus, right knee.  2. 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   PROCEDURES PERFORMED:  1. Right knee arthroscopy.  2. Partial medial meniscectomy.  3. Chondroplasty of the medial, lateral, and patellofemoral compartments.   SURGEON: Laurice Record. Hooten, MD  ANESTHESIA: General.   ESTIMATED BLOOD LOSS: Minimal.   TOURNIQUET TIME: Not used.   DRAINS: None.   INDICATIONS FOR SURGERY: The patient is a 74 year old male who has been seen for complaints of right knee pain and occasional swelling. MRI demonstrated findings consistent with meniscal pathology as well as some chondral changes. After discussion of the risks and benefits of surgical intervention, the patient expressed his understanding of the risks and benefits and agreed with plans for surgical intervention.   PROCEDURE IN DETAIL: The patient was brought into the operating room and, after adequate general anesthesia was achieved, a tourniquet was placed on the patient's right thigh and the leg was placed in a leg holder. All bony prominences were well padded. The patient's right knee and leg were cleaned and prepped with alcohol and DuraPrep and draped in the usual sterile fashion. A "time out" was performed as per usual protocol. The anticipated portal sites were injected with 0.25% Marcaine with epinephrine. An anterolateral portal was created and cannula was inserted. The scope was inserted and the knee was distended with fluid using the DePuy Mitek pump. Under visualization with the scope, an anteromedial portal was created and hooked probe was inserted. Inspection of the medial compartment demonstrated severe grade III changes of  chondromalacia to both the tibial and femoral articular surfaces with fibrillation of flap-type lesions. The 50 degree ArthroCare wand was used to debride and contour the chondral irregularities. Significant improvement was seen to the surfaces. Inspection of the medial meniscus demonstrated a tear along the posterior horn. The tear was reasonably well contained and was subsequently debrided using meniscal punches and a 4.5 mm shaver. Finally contouring was performed using the 50 degree ArthroCare wand. The scope was then advanced into the intercondylar region. The anterior cruciate ligament was somewhat attenuated but intact and was performing well with performance of Lachman's test while visualizing the anterior cruciate ligament. The scope was removed from the anterolateral portal and reinserted via the anteromedial portal so as to better visualize the lateral compartment. Significant grade III changes and some near grade IV changes were noted to the lateral compartment. Findings were noted to both the femoral and tibial surfaces. The sites of chondromalacia were debrided and contoured using the 50 degree ArthroCare wand. The lateral meniscus was visualized and probed and felt to be stable. Finally, the scope was positioned so as to visualize the patellofemoral articulation. Some early grade IV changes were noted to the articular surface of the patella with grade III changes noted to the trochlear groove. Debridement and contouring of the articular surface was performed. Good patellar tracking was noted.   The knee was irrigated with copious amounts of fluid and then suctioned dry. The anterolateral portal was reapproximated using 3-0 nylon. A combination of 0.25% Marcaine with epinephrine and 4 mg of morphine was injected via the scope. The scope was removed and the anteromedial portal was reapproximated using 3-0 nylon. A sterile dressing was applied  followed by application of ice wrap.   The patient  tolerated the procedure well. He was transported to the recovery room in stable condition. ____________________________ Laurice Record. Holley Bouche., MD jph:slb D: 11/15/2011 10:32:16 ET      T: 11/15/2011 12:29:56 ET        JOB#: [id number] cc: Jeneen Rinks P. Holley Bouche., MD, <Dictator> Laurice Record Holley Bouche MD ELECTRONICALLY SIGNED 11/15/2011 21:33

## 2015-07-04 ENCOUNTER — Encounter: Payer: Self-pay | Admitting: *Deleted

## 2015-07-10 ENCOUNTER — Ambulatory Visit
Admission: RE | Admit: 2015-07-10 | Discharge: 2015-07-10 | Disposition: A | Discharge status: Home / Self Care | Payer: Medicare Other | Source: Ambulatory Visit | Attending: Ophthalmology | Admitting: Ophthalmology

## 2015-07-10 ENCOUNTER — Encounter: Payer: Self-pay | Admitting: *Deleted

## 2015-07-10 ENCOUNTER — Ambulatory Visit: Payer: Medicare Other | Admitting: Anesthesiology

## 2015-07-10 ENCOUNTER — Encounter
Admission: RE | Disposition: A | Discharge status: Home / Self Care | Payer: Self-pay | Source: Ambulatory Visit | Attending: Ophthalmology

## 2015-07-10 DIAGNOSIS — M479 Spondylosis, unspecified: Secondary | ICD-10-CM | POA: Insufficient documentation

## 2015-07-10 DIAGNOSIS — J449 Chronic obstructive pulmonary disease, unspecified: Secondary | ICD-10-CM | POA: Insufficient documentation

## 2015-07-10 DIAGNOSIS — Z8546 Personal history of malignant neoplasm of prostate: Secondary | ICD-10-CM | POA: Insufficient documentation

## 2015-07-10 DIAGNOSIS — Z96659 Presence of unspecified artificial knee joint: Secondary | ICD-10-CM | POA: Insufficient documentation

## 2015-07-10 DIAGNOSIS — H2511 Age-related nuclear cataract, right eye: Secondary | ICD-10-CM | POA: Diagnosis not present

## 2015-07-10 DIAGNOSIS — Z87891 Personal history of nicotine dependence: Secondary | ICD-10-CM | POA: Insufficient documentation

## 2015-07-10 HISTORY — PX: CATARACT EXTRACTION W/PHACO: SHX586

## 2015-07-10 SURGERY — PHACOEMULSIFICATION, CATARACT, WITH IOL INSERTION
Anesthesia: Monitor Anesthesia Care | Site: Eye | Laterality: Right | Wound class: Clean

## 2015-07-10 MED ORDER — PHENYLEPHRINE HCL 10 % OP SOLN
1.0000 [drp] | OPHTHALMIC | Status: AC | PRN
  Administered 2015-07-10 (×4): 1 [drp] via OPHTHALMIC

## 2015-07-10 MED ORDER — FENTANYL CITRATE (PF) 100 MCG/2ML IJ SOLN
INTRAMUSCULAR | Status: DC | PRN
  Administered 2015-07-10: 50 ug via INTRAVENOUS

## 2015-07-10 MED ORDER — PHENYLEPHRINE HCL 10 % OP SOLN
OPHTHALMIC | Status: AC
  Administered 2015-07-10: 1 [drp] via OPHTHALMIC
  Filled 2015-07-10: qty 5

## 2015-07-10 MED ORDER — TETRACAINE HCL 0.5 % OP SOLN
1.0000 [drp] | OPHTHALMIC | Status: DC | PRN
  Administered 2015-07-10: 1 [drp] via OPHTHALMIC

## 2015-07-10 MED ORDER — POLYMYXIN B-TRIMETHOPRIM 10000-0.1 UNIT/ML-% OP SOLN
1.0000 [drp] | OPHTHALMIC | Status: DC | PRN
  Administered 2015-07-10 (×2): 1 [drp] via OPHTHALMIC

## 2015-07-10 MED ORDER — NA CHONDROIT SULF-NA HYALURON 40-17 MG/ML IO SOLN
INTRAOCULAR | Status: DC | PRN
  Administered 2015-07-10: 1 mL via INTRAOCULAR

## 2015-07-10 MED ORDER — MOXIFLOXACIN HCL 0.5 % OP SOLN: 1.0000 [drp] | OPHTHALMIC | Status: DC | PRN

## 2015-07-10 MED ORDER — POLYMYXIN B-TRIMETHOPRIM 10000-0.1 UNIT/ML-% OP SOLN
OPHTHALMIC | Status: AC
  Administered 2015-07-10: 1 [drp] via OPHTHALMIC
  Filled 2015-07-10: qty 10

## 2015-07-10 MED ORDER — LIDOCAINE HCL 2 % EX GEL
CUTANEOUS | Status: AC
  Filled 2015-07-10: qty 5

## 2015-07-10 MED ORDER — POLYMYXIN B-TRIMETHOPRIM 10000-0.1 UNIT/ML-% OP SOLN
OPHTHALMIC | Status: DC | PRN
  Administered 2015-07-10: 1 [drp] via OPHTHALMIC

## 2015-07-10 MED ORDER — NA CHONDROIT SULF-NA HYALURON 40-17 MG/ML IO SOLN
INTRAOCULAR | Status: AC
  Filled 2015-07-10: qty 1

## 2015-07-10 MED ORDER — CYCLOPENTOLATE HCL 2 % OP SOLN
1.0000 [drp] | OPHTHALMIC | Status: AC | PRN
  Administered 2015-07-10 (×4): 1 [drp] via OPHTHALMIC

## 2015-07-10 MED ORDER — CYCLOPENTOLATE HCL 2 % OP SOLN
OPHTHALMIC | Status: AC
  Administered 2015-07-10: 1 [drp] via OPHTHALMIC
  Filled 2015-07-10: qty 2

## 2015-07-10 MED ORDER — LIDOCAINE HCL 3.5 % OP GEL
1.0000 "application " | OPHTHALMIC | Status: DC | PRN
  Administered 2015-07-10 (×2): 1 via OPHTHALMIC

## 2015-07-10 MED ORDER — CEFUROXIME OPHTHALMIC INJECTION 1 MG/0.1 ML
INJECTION | OPHTHALMIC | Status: DC | PRN
  Administered 2015-07-10: .1 mL via INTRACAMERAL

## 2015-07-10 MED ORDER — POVIDONE-IODINE 5 % OP SOLN
OPHTHALMIC | Status: AC
  Administered 2015-07-10: 1 via OPHTHALMIC
  Filled 2015-07-10: qty 30

## 2015-07-10 MED ORDER — CARBACHOL 0.01 % IO SOLN
INTRAOCULAR | Status: DC | PRN
  Administered 2015-07-10: .5 mL via INTRAOCULAR

## 2015-07-10 MED ORDER — CEFUROXIME OPHTHALMIC INJECTION 1 MG/0.1 ML
INJECTION | OPHTHALMIC | Status: AC
  Filled 2015-07-10: qty 0.1

## 2015-07-10 MED ORDER — EPINEPHRINE HCL 1 MG/ML IJ SOLN
INTRAOCULAR | Status: DC | PRN
  Administered 2015-07-10: 1 mL via OPHTHALMIC

## 2015-07-10 MED ORDER — SODIUM CHLORIDE 0.9 % IV SOLN
INTRAVENOUS | Status: DC
  Administered 2015-07-10 (×2): via INTRAVENOUS

## 2015-07-10 MED ORDER — TETRACAINE HCL 0.5 % OP SOLN
OPHTHALMIC | Status: AC
  Administered 2015-07-10: 1 [drp] via OPHTHALMIC
  Filled 2015-07-10: qty 2

## 2015-07-10 MED ORDER — POVIDONE-IODINE 5 % OP SOLN
1.0000 "application " | OPHTHALMIC | Status: AC | PRN
  Administered 2015-07-10: 1 via OPHTHALMIC

## 2015-07-10 MED ORDER — EPINEPHRINE HCL 1 MG/ML IJ SOLN
INTRAMUSCULAR | Status: AC
  Filled 2015-07-10: qty 1

## 2015-07-10 MED ORDER — MIDAZOLAM HCL 2 MG/2ML IJ SOLN
INTRAMUSCULAR | Status: DC | PRN
  Administered 2015-07-10: 1 mg via INTRAVENOUS

## 2015-07-10 MED ORDER — MOXIFLOXACIN HCL 0.5 % OP SOLN
OPHTHALMIC | Status: AC
  Filled 2015-07-10: qty 3

## 2015-07-10 SURGICAL SUPPLY — 23 items
CANNULA ANT/CHMB 27G (MISCELLANEOUS) ×1 IMPLANT
CANNULA ANT/CHMB 27GA (MISCELLANEOUS) ×2 IMPLANT
CUP MEDICINE 2OZ PLAST GRAD ST (MISCELLANEOUS) ×2 IMPLANT
GLOVE BIO SURGEON STRL SZ8 (GLOVE) ×2 IMPLANT
GLOVE BIOGEL M 6.5 STRL (GLOVE) ×2 IMPLANT
GLOVE SURG LX 8.0 MICRO (GLOVE) ×1
GLOVE SURG LX STRL 8.0 MICRO (GLOVE) ×1 IMPLANT
GOWN STRL REUS W/ TWL LRG LVL3 (GOWN DISPOSABLE) ×2 IMPLANT
GOWN STRL REUS W/TWL LRG LVL3 (GOWN DISPOSABLE) ×4
LENS IOL TECNIS 20.5 (Intraocular Lens) ×2 IMPLANT
LENS IOL TECNIS MONO 1P 20.5 (Intraocular Lens) IMPLANT
PACK CATARACT (MISCELLANEOUS) ×2 IMPLANT
PACK CATARACT BRASINGTON LX (MISCELLANEOUS) ×2 IMPLANT
PACK EYE AFTER SURG (MISCELLANEOUS) ×2 IMPLANT
SOL BSS BAG (MISCELLANEOUS) ×2
SOL PREP PVP 2OZ (MISCELLANEOUS) ×2
SOLUTION BSS BAG (MISCELLANEOUS) ×1 IMPLANT
SOLUTION PREP PVP 2OZ (MISCELLANEOUS) ×1 IMPLANT
SYR 3ML LL SCALE MARK (SYRINGE) ×2 IMPLANT
SYR 5ML LL (SYRINGE) ×2 IMPLANT
SYR TB 1ML 27GX1/2 LL (SYRINGE) ×2 IMPLANT
WATER STERILE IRR 1000ML POUR (IV SOLUTION) ×2 IMPLANT
WIPE NON LINTING 3.25X3.25 (MISCELLANEOUS) ×2 IMPLANT

## 2015-07-10 NOTE — Op Note (Signed)
PREOPERATIVE DIAGNOSIS:  Nuclear sclerotic cataract of the right eye.   POSTOPERATIVE DIAGNOSIS: nuclear sclerotic cataract right eye   OPERATIVE PROCEDURE:  Procedure(s): CATARACT EXTRACTION PHACO AND INTRAOCULAR LENS PLACEMENT (IOC)   SURGEON:  Birder Robson, MD.   ANESTHESIA:  Anesthesiologist: Martha Clan, MD CRNA: Nelda Marseille, CRNA  1.      Managed anesthesia care. 2.      Topical tetracaine drops followed by 2% Xylocaine jelly applied in the preoperative holding area.   COMPLICATIONS:  None.   TECHNIQUE:   Stop and chop   DESCRIPTION OF PROCEDURE:  The patient was examined and consented in the preoperative holding area where the aforementioned topical anesthesia was applied to the right eye and then brought back to the Operating Room where the right eye was prepped and draped in the usual sterile ophthalmic fashion and a lid speculum was placed. A paracentesis was created with the side port blade and the anterior chamber was filled with viscoelastic. A near clear corneal incision was performed with the steel keratome. A continuous curvilinear capsulorrhexis was performed with a cystotome followed by the capsulorrhexis forceps. Hydrodissection and hydrodelineation were carried out with BSS on a blunt cannula. The lens was removed in a stop and chop  technique and the remaining cortical material was removed with the irrigation-aspiration handpiece. The capsular bag was inflated with viscoelastic and the Technis ZCB00  lens was placed in the capsular bag without complication. The remaining viscoelastic was removed from the eye with the irrigation-aspiration handpiece. The wounds were hydrated. The anterior chamber was flushed with Miostat and the eye was inflated to physiologic pressure. 0.1 mL of cefuroxime concentration 10 mg/mL was placed in the anterior chamber. The wounds were found to be water tight. The eye was dressed with Vigamox. The patient was given protective glasses to wear  throughout the day and a shield with which to sleep tonight. The patient was also given drops with which to begin a drop regimen today and will follow-up with me in one day.  Implant Name Type Inv. Item Serial No. Manufacturer Lot No. LRB No. Used  LENS IMPL INTRAOC ZCB00 20.5 - [id number] Intraocular Lens LENS IMPL INTRAOC ZCB00 20.5 [id number]   Right 1   Procedure(s) with comments: CATARACT EXTRACTION PHACO AND INTRAOCULAR LENS PLACEMENT (IOC) (Right) - [place] 00:58 AP% 26.6 CDE 15.47 fluid pack lot # [id number]  Electronically signed: [place] 07/10/2015 11:12 AM

## 2015-07-10 NOTE — Anesthesia Preprocedure Evaluation (Signed)
Anesthesia Evaluation  Patient identified by MRN, date of birth, ID band Patient awake    Reviewed: Allergy & Precautions, H&P , NPO status , Patient's Chart, lab work & pertinent test results, reviewed documented beta blocker date and time   History of Anesthesia Complications Negative for: history of anesthetic complications  Airway Mallampati: III  TM Distance: >3 FB Neck ROM: full    Dental no notable dental hx. (+) Upper Dentures, Lower Dentures   Pulmonary neg shortness of breath, neg sleep apnea, COPD, Recent URI , Residual Cough, former smoker,    Pulmonary exam normal breath sounds clear to auscultation       Cardiovascular Exercise Tolerance: Good (-) hypertension(-) angina(-) CAD, (-) Past MI, (-) Cardiac Stents and (-) CABG Normal cardiovascular exam(-) dysrhythmias + Valvular Problems/Murmurs (as a child)  Rhythm:regular Rate:Normal     Neuro/Psych negative neurological ROS  negative psych ROS   GI/Hepatic negative GI ROS, Neg liver ROS,   Endo/Other  negative endocrine ROS  Renal/GU negative Renal ROS  negative genitourinary   Musculoskeletal   Abdominal   Peds  Hematology negative hematology ROS (+)   Anesthesia Other Findings Past Medical History:   History of prostate cancer                                   COPD (chronic obstructive pulmonary disease) (*              Diverticulosis of colon                                      Pelvic fracture (HCC)                                        IBS (irritable bowel syndrome)                                 Comment:improved after retirement   Colon polyps                                                 Osteoarthritis                                                 Comment:knees and cervical/lumbar spine s/p surgeries               and injections   Femoral bruit                                                  Comment:bilateral, found by Dr. Jefm Bryant  Heart murmur  Comment:child   Pneumonia                                                      Comment:child   Cancer ([place])                                                   Comment:s/p prostate   Shortness of breath                                            Comment:doe   Reproductive/Obstetrics negative OB ROS                             Anesthesia Physical Anesthesia Plan  ASA: II  Anesthesia Plan: MAC   Post-op Pain Management:    Induction:   Airway Management Planned:   Additional Equipment:   Intra-op Plan:   Post-operative Plan:   Informed Consent: I have reviewed the patients History and Physical, chart, labs and discussed the procedure including the risks, benefits and alternatives for the proposed anesthesia with the patient or authorized representative who has indicated his/her understanding and acceptance.   Dental Advisory Given  Plan Discussed with: Anesthesiologist, CRNA and Surgeon  Anesthesia Plan Comments:         Anesthesia Quick Evaluation

## 2015-07-10 NOTE — Discharge Instructions (Signed)
AMBULATORY SURGERY  DISCHARGE INSTRUCTIONS   1) The drugs that you were given will stay in your system until tomorrow so for the next 24 hours you should not:  A) Drive an automobile B) Make any legal decisions C) Drink any alcoholic beverage   2) You may resume regular meals tomorrow.  Today it is better to start with liquids and gradually work up to solid foods.  You may eat anything you prefer, but it is better to start with liquids, then soup and crackers, and gradually work up to solid foods.   3) Please notify your doctor immediately if you have any unusual bleeding, trouble breathing, redness and pain at the surgery site, drainage, fever, or pain not relieved by medication.    4) Additional Instructions:    Eye Surgery Discharge Instructions  Expect mild scratchy sensation or mild soreness. DO NOT RUB YOUR EYE!  The day of surgery:  Minimal physical activity, but bed rest is not required  No reading, computer work, or close hand work  No bending, lifting, or straining.  May watch TV  For 24 hours:  No driving, legal decisions, or alcoholic beverages  Safety precautions  Eat anything you prefer: It is better to start with liquids, then soup then solid foods.  _____ Eye patch should be worn until postoperative exam tomorrow.  ____ Solar shield eyeglasses should be worn for comfort in the sunlight/patch while sleeping  Resume all regular medications including aspirin or Coumadin if these were discontinued prior to surgery. You may shower, bathe, shave, or wash your hair. Tylenol may be taken for mild discomfort.  Call your doctor if you experience significant pain, nausea, or vomiting, fever > 101 or other signs of infection. [phone] or [phone] Specific instructions:  Follow-up Information    Follow up with PORFILIO,WILLIAM LOUIS, MD In 1 day.   Specialty:  Ophthalmology   Why:  January 11 at 10:25am   Contact information:   [place]  [id number] [phone]         Please contact your physician with any problems or Same Day Surgery at [phone], Monday through Friday 6 am to 4 pm, or [place] at [facility] number at [phone].

## 2015-07-10 NOTE — Transfer of Care (Signed)
Immediate Anesthesia Transfer of Care Note  Patient: Carlos Olson  Procedure(s) Performed: Procedure(s) with comments: CATARACT EXTRACTION PHACO AND INTRAOCULAR LENS PLACEMENT (IOC) (Right) - [place] 00:58 AP% 26.6 CDE 15.47 fluid pack lot # [id number]  Patient Location: PACU  Anesthesia Type:MAC  Level of Consciousness: awake, alert  and oriented  Airway & Oxygen Therapy: Patient Spontanous Breathing and Patient connected to nasal cannula oxygen  Post-op Assessment: Report given to RN and Post -op Vital signs reviewed and stable  Post vital signs: Reviewed, stable and unstable  Last Vitals:  Filed Vitals:   07/10/15 0941 07/10/15 1111  BP: 147/92   Pulse: 84   Resp: 20 18    Complications: No apparent anesthesia complications

## 2015-07-10 NOTE — Anesthesia Procedure Notes (Signed)
Procedure Name: MAC Date/Time: 07/10/2015 10:55 AM Performed by: Nelda Marseille Pre-anesthesia Checklist: Patient identified, Emergency Drugs available, Suction available, Patient being monitored and Timeout performed Oxygen Delivery Method: Nasal cannula

## 2015-07-10 NOTE — Anesthesia Postprocedure Evaluation (Signed)
Anesthesia Post Note  Patient: Carlos Olson  Procedure(s) Performed: Procedure(s) (LRB): CATARACT EXTRACTION PHACO AND INTRAOCULAR LENS PLACEMENT (IOC) (Right)  Patient location during evaluation: PACU Anesthesia Type: General Level of consciousness: awake and alert Pain management: pain level controlled Vital Signs Assessment: post-procedure vital signs reviewed and stable Respiratory status: spontaneous breathing, nonlabored ventilation, respiratory function stable and patient connected to nasal cannula oxygen Cardiovascular status: blood pressure returned to baseline and stable Postop Assessment: no signs of nausea or vomiting Anesthetic complications: no    Last Vitals:  Filed Vitals:   07/10/15 1111 07/10/15 1116  BP: 102/81 132/87  Pulse: 65 71  Temp: 36.8 C   Resp: 18 18    Last Pain: There were no vitals filed for this visit.               Martha Clan

## 2015-07-10 NOTE — H&P (Signed)
All labs reviewed. Abnormal studies sent to patients PCP when indicated.  Previous H&P reviewed, patient examined, there are NO CHANGES.  Carlos Vickers LOUIS1/10/201710:44 AM

## 2015-07-23 ENCOUNTER — Encounter: Payer: Self-pay | Admitting: *Deleted

## 2015-07-24 ENCOUNTER — Encounter: Payer: Self-pay | Admitting: Anesthesiology

## 2015-07-24 ENCOUNTER — Encounter
Admission: RE | Disposition: A | Discharge status: Home / Self Care | Payer: Self-pay | Source: Ambulatory Visit | Attending: Ophthalmology

## 2015-07-24 ENCOUNTER — Ambulatory Visit: Payer: Medicare Other | Admitting: Anesthesiology

## 2015-07-24 ENCOUNTER — Ambulatory Visit
Admission: RE | Admit: 2015-07-24 | Discharge: 2015-07-24 | Disposition: A | Discharge status: Home / Self Care | Payer: Medicare Other | Source: Ambulatory Visit | Attending: Ophthalmology | Admitting: Ophthalmology

## 2015-07-24 DIAGNOSIS — Z9889 Other specified postprocedural states: Secondary | ICD-10-CM | POA: Insufficient documentation

## 2015-07-24 DIAGNOSIS — J449 Chronic obstructive pulmonary disease, unspecified: Secondary | ICD-10-CM | POA: Insufficient documentation

## 2015-07-24 DIAGNOSIS — Z8701 Personal history of pneumonia (recurrent): Secondary | ICD-10-CM | POA: Diagnosis not present

## 2015-07-24 DIAGNOSIS — H2512 Age-related nuclear cataract, left eye: Secondary | ICD-10-CM | POA: Diagnosis present

## 2015-07-24 DIAGNOSIS — M1991 Primary osteoarthritis, unspecified site: Secondary | ICD-10-CM | POA: Insufficient documentation

## 2015-07-24 DIAGNOSIS — Z8546 Personal history of malignant neoplasm of prostate: Secondary | ICD-10-CM | POA: Diagnosis not present

## 2015-07-24 DIAGNOSIS — Z9841 Cataract extraction status, right eye: Secondary | ICD-10-CM | POA: Insufficient documentation

## 2015-07-24 DIAGNOSIS — Z87891 Personal history of nicotine dependence: Secondary | ICD-10-CM | POA: Diagnosis not present

## 2015-07-24 HISTORY — PX: CATARACT EXTRACTION W/PHACO: SHX586

## 2015-07-24 SURGERY — PHACOEMULSIFICATION, CATARACT, WITH IOL INSERTION
Anesthesia: Monitor Anesthesia Care | Site: Eye | Laterality: Left | Wound class: Clean

## 2015-07-24 MED ORDER — MIDAZOLAM HCL 2 MG/2ML IJ SOLN
INTRAMUSCULAR | Status: DC | PRN
  Administered 2015-07-24: 1 mg via INTRAVENOUS

## 2015-07-24 MED ORDER — POLYMYXIN B-TRIMETHOPRIM 10000-0.1 UNIT/ML-% OP SOLN
OPHTHALMIC | Status: DC | PRN
  Administered 2015-07-24: 1 [drp] via OPHTHALMIC

## 2015-07-24 MED ORDER — TETRACAINE HCL 0.5 % OP SOLN
1.0000 [drp] | OPHTHALMIC | Status: DC | PRN
  Administered 2015-07-24: 1 [drp] via OPHTHALMIC

## 2015-07-24 MED ORDER — NA CHONDROIT SULF-NA HYALURON 40-17 MG/ML IO SOLN
INTRAOCULAR | Status: DC | PRN
  Administered 2015-07-24: 1 mL via INTRAOCULAR

## 2015-07-24 MED ORDER — CEFUROXIME OPHTHALMIC INJECTION 1 MG/0.1 ML
INJECTION | OPHTHALMIC | Status: DC | PRN
  Administered 2015-07-24: .1 mL via INTRACAMERAL

## 2015-07-24 MED ORDER — CEFUROXIME OPHTHALMIC INJECTION 1 MG/0.1 ML
INJECTION | OPHTHALMIC | Status: AC
  Filled 2015-07-24: qty 0.1

## 2015-07-24 MED ORDER — PHENYLEPHRINE HCL 10 % OP SOLN
OPHTHALMIC | Status: AC
Start: 2015-07-24 — End: 2015-07-24
  Administered 2015-07-24: 1 [drp] via OPHTHALMIC
  Filled 2015-07-24: qty 5

## 2015-07-24 MED ORDER — LIDOCAINE HCL 3.5 % OP GEL
OPHTHALMIC | Status: AC
  Administered 2015-07-24: 1 via OPHTHALMIC
  Filled 2015-07-24: qty 1

## 2015-07-24 MED ORDER — EPINEPHRINE HCL 1 MG/ML IJ SOLN
INTRAMUSCULAR | Status: AC
  Filled 2015-07-24: qty 1

## 2015-07-24 MED ORDER — POLYMYXIN B-TRIMETHOPRIM 10000-0.1 UNIT/ML-% OP SOLN
OPHTHALMIC | Status: AC
  Administered 2015-07-24: 1 [drp] via OPHTHALMIC
  Filled 2015-07-24: qty 10

## 2015-07-24 MED ORDER — PHENYLEPHRINE HCL 10 % OP SOLN
1.0000 [drp] | OPHTHALMIC | Status: AC | PRN
  Administered 2015-07-24 (×4): 1 [drp] via OPHTHALMIC

## 2015-07-24 MED ORDER — CYCLOPENTOLATE HCL 2 % OP SOLN
1.0000 [drp] | OPHTHALMIC | Status: AC | PRN
  Administered 2015-07-24 (×4): 1 [drp] via OPHTHALMIC

## 2015-07-24 MED ORDER — MOXIFLOXACIN HCL 0.5 % OP SOLN
OPHTHALMIC | Status: AC
  Filled 2015-07-24: qty 3

## 2015-07-24 MED ORDER — LIDOCAINE HCL 3.5 % OP GEL
1.0000 "application " | OPHTHALMIC | Status: AC | PRN
  Administered 2015-07-24: 1 via OPHTHALMIC

## 2015-07-24 MED ORDER — POLYMYXIN B-TRIMETHOPRIM 10000-0.1 UNIT/ML-% OP SOLN
1.0000 [drp] | OPHTHALMIC | Status: AC | PRN
  Administered 2015-07-24 (×3): 1 [drp] via OPHTHALMIC

## 2015-07-24 MED ORDER — FENTANYL CITRATE (PF) 100 MCG/2ML IJ SOLN
INTRAMUSCULAR | Status: DC | PRN
  Administered 2015-07-24: 50 ug via INTRAVENOUS

## 2015-07-24 MED ORDER — EPINEPHRINE HCL 1 MG/ML IJ SOLN
INTRAOCULAR | Status: DC | PRN
  Administered 2015-07-24: 1 mL via OPHTHALMIC

## 2015-07-24 MED ORDER — TETRACAINE HCL 0.5 % OP SOLN
OPHTHALMIC | Status: AC
  Administered 2015-07-24: 1 [drp] via OPHTHALMIC
  Filled 2015-07-24: qty 2

## 2015-07-24 MED ORDER — MOXIFLOXACIN HCL 0.5 % OP SOLN: 1.0000 [drp] | OPHTHALMIC | Status: DC | PRN

## 2015-07-24 MED ORDER — ARMC OPHTHALMIC DILATING GEL
OPHTHALMIC | Status: AC
  Filled 2015-07-24: qty 0.25

## 2015-07-24 MED ORDER — POVIDONE-IODINE 5 % OP SOLN
1.0000 "application " | OPHTHALMIC | Status: AC | PRN
  Administered 2015-07-24: 1 via OPHTHALMIC

## 2015-07-24 MED ORDER — CARBACHOL 0.01 % IO SOLN
INTRAOCULAR | Status: DC | PRN
  Administered 2015-07-24: .5 mL via INTRAOCULAR

## 2015-07-24 MED ORDER — CYCLOPENTOLATE HCL 2 % OP SOLN
OPHTHALMIC | Status: AC
  Administered 2015-07-24: 1 [drp] via OPHTHALMIC
  Filled 2015-07-24: qty 2

## 2015-07-24 MED ORDER — SODIUM CHLORIDE 0.9 % IV SOLN
INTRAVENOUS | Status: DC
  Administered 2015-07-24 (×2): via INTRAVENOUS

## 2015-07-24 MED ORDER — POVIDONE-IODINE 5 % OP SOLN
OPHTHALMIC | Status: AC
  Administered 2015-07-24: 1 via OPHTHALMIC
  Filled 2015-07-24: qty 30

## 2015-07-24 SURGICAL SUPPLY — 23 items
CANNULA ANT/CHMB 27G (MISCELLANEOUS) ×1 IMPLANT
CANNULA ANT/CHMB 27GA (MISCELLANEOUS) ×2 IMPLANT
CUP MEDICINE 2OZ PLAST GRAD ST (MISCELLANEOUS) ×2 IMPLANT
GLOVE BIO SURGEON STRL SZ8 (GLOVE) ×2 IMPLANT
GLOVE BIOGEL M 6.5 STRL (GLOVE) ×2 IMPLANT
GLOVE SURG LX 8.0 MICRO (GLOVE) ×1
GLOVE SURG LX STRL 8.0 MICRO (GLOVE) ×1 IMPLANT
GOWN STRL REUS W/ TWL LRG LVL3 (GOWN DISPOSABLE) ×2 IMPLANT
GOWN STRL REUS W/TWL LRG LVL3 (GOWN DISPOSABLE) ×4
LENS IOL TECNIS 20.0 (Intraocular Lens) ×2 IMPLANT
LENS IOL TECNIS MONO 1P 20.0 (Intraocular Lens) IMPLANT
PACK CATARACT (MISCELLANEOUS) ×2 IMPLANT
PACK CATARACT BRASINGTON LX (MISCELLANEOUS) ×2 IMPLANT
PACK EYE AFTER SURG (MISCELLANEOUS) ×2 IMPLANT
SOL BSS BAG (MISCELLANEOUS) ×2
SOL PREP PVP 2OZ (MISCELLANEOUS) ×2
SOLUTION BSS BAG (MISCELLANEOUS) ×1 IMPLANT
SOLUTION PREP PVP 2OZ (MISCELLANEOUS) ×1 IMPLANT
SYR 3ML LL SCALE MARK (SYRINGE) ×2 IMPLANT
SYR 5ML LL (SYRINGE) ×2 IMPLANT
SYR TB 1ML 27GX1/2 LL (SYRINGE) ×2 IMPLANT
WATER STERILE IRR 1000ML POUR (IV SOLUTION) ×2 IMPLANT
WIPE NON LINTING 3.25X3.25 (MISCELLANEOUS) ×2 IMPLANT

## 2015-07-24 NOTE — Discharge Instructions (Signed)
Eye Surgery Discharge Instructions  Expect mild scratchy sensation or mild soreness. DO NOT RUB YOUR EYE!  The day of surgery:  Minimal physical activity, but bed rest is not required  No reading, computer work, or close hand work  No bending, lifting, or straining.  May watch TV  For 24 hours:  No driving, legal decisions, or alcoholic beverages  Safety precautions  Eat anything you prefer: It is better to start with liquids, then soup then solid foods.  _____ Eye patch should be worn until postoperative exam tomorrow.  ____ Solar shield eyeglasses should be worn for comfort in the sunlight/patch while sleeping  Resume all regular medications including aspirin or Coumadin if these were discontinued prior to surgery. You may shower, bathe, shave, or wash your hair. Tylenol may be taken for mild discomfort.  Call your doctor if you experience significant pain, nausea, or vomiting, fever > 101 or other signs of infection. [phone] or [phone] Specific instructions:  Follow-up Information    Follow up with Tim Lair, MD On 07/25/2015.   Specialty:  Ophthalmology   Why:  9:25   Contact information:   [address] [place] [address] [phone]

## 2015-07-24 NOTE — Op Note (Signed)
PREOPERATIVE DIAGNOSIS:  Nuclear sclerotic cataract of the left eye.   POSTOPERATIVE DIAGNOSIS:  nuclear sclerotic cataract left eye   OPERATIVE PROCEDURE:  Procedure(s): CATARACT EXTRACTION PHACO AND INTRAOCULAR LENS PLACEMENT (IOC)   SURGEON:  Birder Robson, MD.   ANESTHESIA:   No anesthesia staff entered.  1.      Managed anesthesia care. 2.      Topical tetracaine drops followed by 2% Xylocaine jelly applied in the preoperative holding area.   COMPLICATIONS:  None.   TECHNIQUE:   Stop and chop   DESCRIPTION OF PROCEDURE:  The patient was examined and consented in the preoperative holding area where the aforementioned topical anesthesia was applied to the left eye and then brought back to the Operating Room where the left eye was prepped and draped in the usual sterile ophthalmic fashion and a lid speculum was placed. A paracentesis was created with the side port blade and the anterior chamber was filled with viscoelastic. A near clear corneal incision was performed with the steel keratome. A continuous curvilinear capsulorrhexis was performed with a cystotome followed by the capsulorrhexis forceps. Hydrodissection and hydrodelineation were carried out with BSS on a blunt cannula. The lens was removed in a stop and chop  technique and the remaining cortical material was removed with the irrigation-aspiration handpiece. The capsular bag was inflated with viscoelastic and the Technis ZCB00 lens was placed in the capsular bag without complication. The remaining viscoelastic was removed from the eye with the irrigation-aspiration handpiece. The wounds were hydrated. The anterior chamber was flushed with Miostat and the eye was inflated to physiologic pressure. 0.1 mL of cefuroxime concentration 10 mg/mL was placed in the anterior chamber. The wounds were found to be water tight. The eye was dressed with Polytrim. The patient was given protective glasses to wear throughout the day and a shield  with which to sleep tonight. The patient was also given drops with which to begin a drop regimen today and will follow-up with me in one day.  Implant Name Type Inv. Item Serial No. Manufacturer Lot No. LRB No. Used  LENS IMPL INTRAOC ZCB00 20.0 - [id number] Intraocular Lens LENS IMPL INTRAOC ZCB00 20.0 [id number]   Left 1   Procedure(s) with comments: CATARACT EXTRACTION PHACO AND INTRAOCULAR LENS PLACEMENT (IOC) (Left) - [place] 00:38 AP% 19.8 CDE 7.73 fluid pack lot # [id number]  Electronically signed: Jaunice Mirza LOUIS 07/24/2015 12:30 PM

## 2015-07-24 NOTE — Anesthesia Postprocedure Evaluation (Signed)
Anesthesia Post Note  Patient: Carlos Olson  Procedure(s) Performed: Procedure(s) (LRB): CATARACT EXTRACTION PHACO AND INTRAOCULAR LENS PLACEMENT (IOC) (Left)  Patient location during evaluation: PACU Anesthesia Type: MAC Level of consciousness: awake, awake and alert and oriented Pain management: pain level controlled Vital Signs Assessment: post-procedure vital signs reviewed and stable Respiratory status: spontaneous breathing, nonlabored ventilation and respiratory function stable Cardiovascular status: blood pressure returned to baseline and stable Postop Assessment: no headache and no backache Anesthetic complications: no    Last Vitals: There were no vitals filed for this visit.  Last Pain: There were no vitals filed for this visit.               Jacquelyne Quarry,  [employer]

## 2015-07-24 NOTE — Anesthesia Preprocedure Evaluation (Addendum)
Anesthesia Evaluation  Patient identified by MRN, date of birth, ID band Patient awake    Reviewed: Allergy & Precautions, NPO status , Patient's Chart, lab work & pertinent test results, reviewed documented beta blocker date and time   Airway Mallampati: II  TM Distance: >3 FB     Dental  (+) Chipped, Upper Dentures   Pulmonary shortness of breath, pneumonia, resolved, COPD, former smoker,           Cardiovascular + Valvular Problems/Murmurs      Neuro/Psych    GI/Hepatic   Endo/Other    Renal/GU      Musculoskeletal  (+) Arthritis ,   Abdominal   Peds  Hematology   Anesthesia Other Findings Adequate neck movement.  Reproductive/Obstetrics                           Anesthesia Physical Anesthesia Plan  ASA: III  Anesthesia Plan: MAC   Post-op Pain Management:    Induction:   Airway Management Planned:   Additional Equipment:   Intra-op Plan:   Post-operative Plan:   Informed Consent: I have reviewed the patients History and Physical, chart, labs and discussed the procedure including the risks, benefits and alternatives for the proposed anesthesia with the patient or authorized representative who has indicated his/her understanding and acceptance.     Plan Discussed with: CRNA  Anesthesia Plan Comments:         Anesthesia Quick Evaluation

## 2015-07-24 NOTE — H&P (Signed)
All labs reviewed. Abnormal studies sent to patients PCP when indicated.  Previous H&P reviewed, patient examined, there are NO CHANGES.  Carlos Olson LOUIS1/24/201712:05 PM

## 2015-07-24 NOTE — Transfer of Care (Signed)
Immediate Anesthesia Transfer of Care Note  Patient: Carlos Olson  Procedure(s) Performed: Procedure(s) with comments: CATARACT EXTRACTION PHACO AND INTRAOCULAR LENS PLACEMENT (IOC) (Left) - [place] 00:38 AP% 19.8 CDE 7.73 fluid pack lot # [id number]  Patient Location: PACU  Anesthesia Type:MAC  Level of Consciousness: awake, alert  and oriented  Airway & Oxygen Therapy: Patient Spontanous Breathing  Post-op Assessment: Report given to RN and Post -op Vital signs reviewed and stable  Post vital signs: Reviewed and stable  Last Vitals: There were no vitals filed for this visit.  Complications: No apparent anesthesia complications

## 2015-08-02 ENCOUNTER — Encounter: Payer: Self-pay | Admitting: Podiatry

## 2015-08-02 ENCOUNTER — Ambulatory Visit (INDEPENDENT_AMBULATORY_CARE_PROVIDER_SITE_OTHER): Payer: Medicare Other | Admitting: Podiatry

## 2015-08-02 VITALS — BP 137/81 | HR 62 | Resp 18

## 2015-08-02 DIAGNOSIS — Q828 Other specified congenital malformations of skin: Secondary | ICD-10-CM

## 2015-08-02 DIAGNOSIS — L820 Inflamed seborrheic keratosis: Secondary | ICD-10-CM | POA: Diagnosis not present

## 2015-08-02 NOTE — Progress Notes (Signed)
   Subjective:    Patient ID: GURBIR MENDER, male    DOB: 04/02/1941, 75 y.o.   MRN: [id number]  HPI  75 year old male presents the office or concerns of the callus of the bottom of his left foot and the ball his throat is been ongoing for the last several weeks and has become painful weightbearing and pressure. Denies any drainage or redness or swelling around the area. No previous treatment. No other complaints.  Review of Systems  All other systems reviewed and are negative.      Objective:   Physical Exam General: AAO x3, NAD  Dermatological: Small annular hyperkeratotic lesion present left foot second metatarsal 4. Upon debridement no underlying ulceration, drainage or other signs of infection. No open lesions or other pre-ulcerative lesions identified. There is no pain with calf compression, swelling, warmth, erythema.   Neruologic: Grossly intact via light touch bilateral. Vibratory intact via tuning fork bilateral. Protective threshold with Semmes Wienstein monofilament intact to all pedal sites bilateral. Patellar and Achilles deep tendon reflexes 2+ bilateral. No Babinski or clonus noted bilateral.   Musculoskeletal:  tenderness overlying left foot second metatarsal 4 hyperkeratotic lesion. No gross boney pedal deformities bilateral. No pain, crepitus, or limitation noted with foot and ankle range of motion bilateral. Muscular strength 5/5 in all groups tested bilateral.  Gait: Unassisted, Nonantalgic.         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 No evidence of verruca. There was cleaned and a pad was placed followed by salicylic acid and a bandage. Post procedure instructions were discussed. Discussed recurrence. Offloading pads were dispensed. Monitor for any  clinical signs or symptoms of infection and directed to call the office immediately should any occur or go to the ER. -Follow-up if symptoms reoccur or sooner if any problems arise. In the meantime, encouraged to call the office with any questions, concerns, change in symptoms.   Celesta Gentile, DPM

## 2015-08-16 ENCOUNTER — Encounter: Payer: Self-pay | Admitting: Podiatry

## 2015-08-16 ENCOUNTER — Ambulatory Visit (INDEPENDENT_AMBULATORY_CARE_PROVIDER_SITE_OTHER): Payer: Medicare Other | Admitting: Podiatry

## 2015-08-16 DIAGNOSIS — B070 Plantar wart: Secondary | ICD-10-CM | POA: Diagnosis not present

## 2015-08-16 DIAGNOSIS — B079 Viral wart, unspecified: Secondary | ICD-10-CM

## 2015-08-16 DIAGNOSIS — B078 Other viral warts: Secondary | ICD-10-CM

## 2015-08-16 NOTE — Patient Instructions (Signed)
Take dressing off in 8 hours and wash the foot with soap and water. If it is hurting or becomes uncomfortable before the 8 hours, go ahead and remove the bandage and wash the area.  If it blisters, apply antibiotic ointment and a band-aid.  Monitor for any signs/symptoms of infection. Call the office immediately if any occur or go directly to the emergency room. Call with any questions/concerns.   

## 2015-08-17 DIAGNOSIS — B078 Other viral warts: Secondary | ICD-10-CM | POA: Insufficient documentation

## 2015-08-17 DIAGNOSIS — B079 Viral wart, unspecified: Secondary | ICD-10-CM | POA: Insufficient documentation

## 2015-08-17 NOTE — Progress Notes (Signed)
Patient ID: Carlos Olson, male   DOB: 01/07/1941, 75 y.o.   MRN: [id number]  Subjective: 75 year old male presents the office they for follow-up evaluation of lesion the left foot submetatarsal 4. He states he felt better for the first couple days after last appointment the pain did recur. Denies any drainage or sign redness or swelling around the lesion. No recent injury or trauma. Denies any systemic complaints such as fevers, chills, nausea, vomiting. No acute changes since last appointment, and no other complaints at this time.   Objective: AAO x3, NAD DP/PT pulses palpable bilaterally, CRT less than 3 seconds Protective sensation intact with Simms Weinstein monofilament Left foot submetatarsal 4 hyperkeratotic lesion. Today upon agreement there was pinpoint bleeding and evidence of verruca. There is no swelling erythema, before meals cellulitis, edema. There is no drainage or pus. Tenderness to palpation to sleep on this lesion. No areas of pinpoint bony tenderness or pain with vibratory sensation. MMT 5/5, ROM WNL. No edema, erythema, increase in warmth to bilateral lower extremities.  No open lesions or pre-ulcerative lesions.  No pain with calf compression, swelling, warmth, erythema  Assessment: Left foot submetatarsal 4 verruca  Plan: -All treatment options discussed with the patient including all alternatives, risks, complications.  -The lesion was debrided without complications. The areas clean followed by Cantharone and occlusive bandage. Post procedure sections were discussed. -Monitor for any clinical signs or symptoms of infection and directed to call the office immediately should any occur or go to the ER. -Patient encouraged to call the office with any questions, concerns, change in symptoms.   Celesta Gentile, DPM

## 2015-09-05 ENCOUNTER — Other Ambulatory Visit: Payer: Self-pay | Admitting: Family Medicine

## 2015-09-05 DIAGNOSIS — Z8546 Personal history of malignant neoplasm of prostate: Secondary | ICD-10-CM

## 2015-09-05 DIAGNOSIS — E785 Hyperlipidemia, unspecified: Secondary | ICD-10-CM

## 2015-09-06 ENCOUNTER — Ambulatory Visit: Payer: Medicare Other | Admitting: Podiatry

## 2015-09-06 ENCOUNTER — Other Ambulatory Visit (INDEPENDENT_AMBULATORY_CARE_PROVIDER_SITE_OTHER): Payer: Medicare Other

## 2015-09-06 DIAGNOSIS — Z8546 Personal history of malignant neoplasm of prostate: Secondary | ICD-10-CM | POA: Diagnosis not present

## 2015-09-06 DIAGNOSIS — E785 Hyperlipidemia, unspecified: Secondary | ICD-10-CM | POA: Diagnosis not present

## 2015-09-06 LAB — LIPID PANEL
CHOL/HDL RATIO: 3
CHOLESTEROL: 150 mg/dL (ref 0–200)
HDL: 52.6 mg/dL (ref >39.00)
LDL CALC: 83 mg/dL (ref 0–99)
NONHDL: 97.47
Triglycerides: 72 mg/dL (ref 0.0–149.0)
VLDL: 14.4 mg/dL (ref 0.0–40.0)

## 2015-09-06 LAB — BASIC METABOLIC PANEL
BUN: 20 mg/dL (ref 6–23)
CALCIUM: 9.4 mg/dL (ref 8.4–10.5)
CO2: 31 mEq/L (ref 19–32)
Chloride: 107 mEq/L (ref 96–112)
Creatinine, Ser: 1.09 mg/dL (ref 0.40–1.50)
GFR: 70.02 mL/min (ref >60.00)
Glucose, Bld: 99 mg/dL (ref 70–99)
POTASSIUM: 4.8 meq/L (ref 3.5–5.1)
SODIUM: 143 meq/L (ref 135–145)

## 2015-09-06 LAB — PSA: PSA: 0.01 ng/mL — ABNORMAL LOW (ref 0.10–4.00)

## 2015-09-07 ENCOUNTER — Ambulatory Visit (INDEPENDENT_AMBULATORY_CARE_PROVIDER_SITE_OTHER): Payer: Medicare Other

## 2015-09-07 VITALS — BP 138/92 | HR 62 | Temp 97.8°F | Ht 75.5 in | Wt 213.8 lb

## 2015-09-07 DIAGNOSIS — Z0000 Encounter for general adult medical examination without abnormal findings: Secondary | ICD-10-CM

## 2015-09-07 NOTE — Patient Instructions (Addendum)
Carlos Olson , Thank you for taking time to come for your Medicare Wellness Visit. I appreciate your ongoing commitment to your health goals. Please review the following plan we discussed and let me know if I can assist you in the future.   These are the goals we discussed: Goals    . Increase physical activity     Starting 09/07/15, I will increase exercise from 12 min to 24 min daily.        This is a list of the screening recommended for you and due dates:  Health Maintenance  Topic Date Due  . Flu Shot  01/29/2016  . Colon Cancer Screening  12/29/2017  . Tetanus Vaccine  04/20/2019  . DTaP/Tdap/Td vaccine  Completed  . Shingles Vaccine  Completed  . Pneumonia vaccines  Completed   Preventive Care for Adults  A healthy lifestyle and preventive care can promote health and wellness. Preventive health guidelines for adults include the following key practices.  . A routine yearly physical is a good way to check with your health care provider about your health and preventive screening. It is a chance to share any concerns and updates on your health and to receive a thorough exam.  . Visit your dentist for a routine exam and preventive care every 6 months. Brush your teeth twice a day and floss once a day. Good oral hygiene prevents tooth decay and gum disease.  . The frequency of eye exams is based on your age, health, family medical history, use  of contact lenses, and other factors. Follow your health care provider's ecommendations for frequency of eye exams.  . Eat a healthy diet. Foods like vegetables, fruits, whole grains, low-fat dairy products, and lean protein foods contain the nutrients you need without too many calories. Decrease your intake of foods high in solid fats, added sugars, and salt. Eat the right amount of calories for you. Get information about a proper diet from your health care provider, if necessary.  . Regular physical exercise is one of the most important  things you can do for your health. Most adults should get at least 150 minutes of moderate-intensity exercise (any activity that increases your heart rate and causes you to sweat) each week. In addition, most adults need muscle-strengthening exercises on 2 or more days a week.  . Maintain a healthy weight. The body mass index (BMI) is a screening tool to identify possible weight problems. It provides an estimate of body fat based on height and weight. Your health care provider can find your BMI and can help you achieve or maintain a healthy weight.   For adults 20 years and older: ? A BMI below 18.5 is considered underweight. ? A BMI of 18.5 to 24.9 is normal. ? A BMI of 25 to 29.9 is considered overweight. ? A BMI of 30 and above is considered obese.   . Maintain normal blood lipids and cholesterol levels by exercising and minimizing your intake of saturated fat. Eat a balanced diet with plenty of fruit and vegetables. Blood tests for lipids and cholesterol should begin at age 66 and be repeated every 5 years. If your lipid or cholesterol levels are high, you are over 50, or you are at high risk for heart disease, you may need your cholesterol levels checked more frequently. Ongoing high lipid and cholesterol levels should be treated with medicines if diet and exercise are not working.  . If you smoke, find out from your health care  provider how to quit. If you do not use tobacco, please do not start.  . If you choose to drink alcohol, please do not consume more than 2 drinks per day. One drink is considered to be 12 ounces (355 mL) of beer, 5 ounces (148 mL) of wine, or 1.5 ounces (44 mL) of liquor.  . If you are 10-94 years old, ask your health care provider if you should take aspirin to prevent strokes.  . Use sunscreen. Apply sunscreen liberally and repeatedly throughout the day. You should seek shade when your shadow is shorter than you. Protect yourself by wearing long sleeves, pants, a  wide-brimmed hat, and sunglasses year round, whenever you are outdoors.  . Once a month, do a whole body skin exam, using a mirror to look at the skin on your back. Tell your health care provider of new moles, moles that have irregular borders, moles that are larger than a pencil eraser, or moles that have changed in shape or color.    Hearing Loss Hearing loss is a partial or total loss of the ability to hear. This can be temporary or permanent, and it can happen in one or both ears. Hearing loss may be referred to as deafness. Medical care is necessary to treat hearing loss properly and to prevent the condition from getting worse. Your hearing may partially or completely come back, depending on what caused your hearing loss and how severe it is. In some cases, hearing loss is permanent. CAUSES Common causes of hearing loss include:   Too much wax in the ear canal.   Infection of the ear canal or middle ear.   Fluid in the middle ear.   Injury to the ear or surrounding area.   An object stuck in the ear.   Prolonged exposure to loud sounds, such as music.  Less common causes of hearing loss include:   Tumors in the ear.   Viral or bacterial infections, such as meningitis.   A hole in the eardrum (perforated eardrum).  Problems with the hearing nerve that sends signals between the brain and the ear.  Certain medicines.  SYMPTOMS  Symptoms of this condition may include:  Difficulty telling the difference between sounds.  Difficulty following a conversation when there is background noise.  Lack of response to sounds in your environment. This may be most noticeable when you do not respond to startling sounds.  Needing to turn up the volume on the television, radio, etc.  Ringing in the ears.  Dizziness.  Pain in the ears. DIAGNOSIS This condition is diagnosed based on a physical exam and a hearing test (audiometry). The audiometry test will be performed by a  hearing specialist (audiologist). You may also be referred to an ear, nose, and throat (ENT) specialist (otolaryngologist).  TREATMENT Treatment for recent onset of hearing loss may include:   Ear wax removal.   Being prescribed medicines to prevent infection (antibiotics).   Being prescribed medicines to reduce inflammation (corticosteroids).  HOME CARE INSTRUCTIONS  If you were prescribed an antibiotic medicine, take it as told by your health care provider. Do not stop taking the antibiotic even if you start to feel better.  Take over-the-counter and prescription medicines only as told by your health care provider.  Avoid loud noises.   Return to your normal activities as told by your health care provider. Ask your health care provider what activities are safe for you.  Keep all follow-up visits as told by your  health care provider. This is important. SEEK MEDICAL CARE IF:   You feel dizzy.   You develop new symptoms.   You vomit or feel nauseous.   You have a fever.  SEEK IMMEDIATE MEDICAL CARE IF:  You develop sudden changes in your vision.   You have severe ear pain.   You have new or increased weakness.  You have a severe headache.   This information is not intended to replace advice given to you by your health care provider. Make sure you discuss any questions you have with your health care provider.   Document Released: 06/16/2005 Document Revised: 03/07/2015 Document Reviewed: 11/01/2014 Elsevier Interactive Patient Education [employer].

## 2015-09-07 NOTE — Progress Notes (Signed)
Pre visit review using our clinic review tool, if applicable. No additional management support is needed unless otherwise documented below in the visit note. 

## 2015-09-07 NOTE — Progress Notes (Signed)
Subjective:   Carlos Olson is a 75 y.o. male who presents for Medicare Annual/Subsequent preventive examination.   Cardiac Risk Factors include: advanced age (>81men, >75 women);dyslipidemia;male gender     Objective:    Vitals: BP 138/92 mmHg  Pulse 62  Temp(Src) 97.8 F (36.6 C) (Oral)  Ht 6' 3.5" (1.918 m)  Wt 213 lb 12 oz (96.956 kg)  BMI 26.36 kg/m2  SpO2 94%  Tobacco History  Smoking status  . Former Smoker -- 2.00 packs/day for 50 years  . Types: Cigarettes  . Quit date: 05/15/2007  Smokeless tobacco  . Never Used    Comment: quit 06/16/2007     Counseling given: No   Past Medical History  Diagnosis Date  . History of prostate cancer   . COPD (chronic obstructive pulmonary disease) ([place])   . Diverticulosis of colon   . Pelvic fracture ([place])   . IBS (irritable bowel syndrome)     improved after retirement  . Colon polyps   . Osteoarthritis     knees and cervical/lumbar spine s/p surgeries and injections  . Femoral bruit     bilateral, found by Dr. Jefm Bryant  . Heart murmur     child  . Pneumonia     child  . Cancer [facility])     s/p prostate  . Shortness of breath     doe   Past Surgical History  Procedure Laterality Date  . Prostatectomy  08/1990  . Tonsillectomy    . Knee arthroscopy  09/1997    left  . Hernia repair      right 05/1999, left 09/04/05  . Anterior cervical decomp/discectomy fusion  07/29/2011    Procedure: ANTERIOR CERVICAL DECOMPRESSION/DISCECTOMY FUSION 1 LEVEL;  Surgeon: Jessy Oto, MD;  Location: [place];  Service: Orthopedics;  Laterality: N/A;  Anterior Cervical Discectomy and Fusion C3-4  . Colonoscopy  12/2007    [place], diverticulosis and polyp, rpt 5 yrs  . Replacement total knee Right 07/2012  . Foraminotomy 2 level Left 10/2013    C4/5, C5/6 ([place])  . Posterior cervical fusion/foraminotomy N/A 11/22/2013    Procedure: LEFT C4-5 AND C5-6 FORAMINOTOMY;  Surgeon: Jessy Oto, MD;  Location: [place];  Service: Orthopedics;   Laterality: N/A;  . Cataract extraction w/phaco Right 07/10/2015    Procedure: CATARACT EXTRACTION PHACO AND INTRAOCULAR LENS PLACEMENT (IOC);  Surgeon: Birder Robson, MD;  Location: [facility];  Service: Ophthalmology;  Laterality: Right;  [place] 00:58 AP% 26.6 CDE 15.47 fluid pack lot # [id number]  . Joint replacement    . Eye surgery    . Cataract extraction w/phaco Left 07/24/2015    Procedure: CATARACT EXTRACTION PHACO AND INTRAOCULAR LENS PLACEMENT (IOC);  Surgeon: Birder Robson, MD;  Location: [facility];  Service: Ophthalmology;  Laterality: Left;  [place] 00:38 AP% 19.8 CDE 7.73 fluid pack lot # [id number]   Family History  Problem Relation Age of Onset  . Hypertension Mother   . Cancer Father     bone, prostate  . Diabetes Sister     x 40 years  . Cancer Brother     prostate   History  Sexual Activity  . Sexual Activity: No    Outpatient Encounter Prescriptions as of 09/07/2015  Medication Sig  . acetaminophen (TYLENOL) 500 MG tablet Take 500 mg by mouth every 6 (six) hours as needed.  Marland Kitchen acetaminophen (TYLENOL) 100 MG/ML solution Take 10 mg/kg by mouth every 4 (four) hours as needed for fever. Reported  on 09/07/2015   No facility-administered encounter medications on file as of 09/07/2015.    Activities of Daily Living In your present state of health, do you have any difficulty performing the following activities: 09/07/2015  Hearing? Y  Vision? N  Difficulty concentrating or making decisions? N  Walking or climbing stairs? N  Dressing or bathing? N  Doing errands, shopping? N  Preparing Food and eating ? N  Using the Toilet? N  In the past six months, have you accidently leaked urine? N  Do you have problems with loss of bowel control? N  Managing your Medications? N  Managing your Finances? N  Housekeeping or managing your Housekeeping? N    Patient Care Team: Ria Bush, MD as PCP - General (Family Medicine)   Dr. Jacqualyn Posey, Podiatry Dr. George Ina,  Opthalmology Assessment:      Hearing Screening   125Hz  250Hz  500Hz  1000Hz  2000Hz  4000Hz  8000Hz   Right ear:   40 0 40 0   Left ear:   40 0 40 0    Vision Screening Comments: Last eye exam in Jan 2017  Exercise Activities and Dietary recommendations Current Exercise Habits: Home exercise routine, Type of exercise: strength training/weights;stretching;calisthenics, Time (Minutes): 30, Frequency (Times/Week): 6, Weekly Exercise (Minutes/Week): 180, Intensity: Moderate  Goals    . Increase physical activity     Starting 09/07/15, I will increase exercise from 12 min to 24 min daily.       Fall Risk Fall Risk  09/07/2015 09/08/2014 06/24/2013 06/22/2012  Falls in the past year? No No Yes Yes  Number falls in past yr: - - 1 1  Injury with Fall? - - No Yes  Risk Factor Category  - - - High Fall Risk  Risk for fall due to : - - - History of fall(s)   Depression Screen PHQ 2/9 Scores 09/07/2015 09/08/2014 06/24/2013 06/22/2012  PHQ - 2 Score 0 0 0 0    Cognitive Testing No flowsheet data found.  Immunization History  Administered Date(s) Administered  . Influenza Split 03/27/2011  . Influenza Whole 04/27/2001, 03/19/2009, 03/30/2013  . Influenza, High Dose Seasonal PF 04/21/2014  . Pneumococcal Conjugate-13 09/08/2014  . Pneumococcal Polysaccharide-23 04/27/2001, 06/22/2012  . Td 08/28/1998, 04/19/2009  . Zoster 03/27/2011   Screening Tests Health Maintenance  Topic Date Due  . INFLUENZA VACCINE  01/29/2016  . COLONOSCOPY  12/29/2017  . TETANUS/TDAP  04/20/2019  . DTaP/Tdap/Td  Completed  . ZOSTAVAX  Completed  . PNA vac Low Risk Adult  Completed      Plan:    I have personally reviewed the Medicare Annual Wellness questionnaire and have noted the following in the patient's chart:  A. Medical and social history B. Use of alcohol, tobacco or illicit drugs  C. Current medications and supplements D. Functional ability and status E.  Nutritional status F.  Physical  activity G. Advance directives H. List of other physicians I.  Hospitalizations, surgeries, and ER visits in previous 12 months J.  [place] to include hearing, vision, cognitive, depression L. Referrals and appointments - none  In addition, I reviewed preventive protocols, quality metrics, and best practice recommendations specific to patient. A written personalized care plan for preventive services as well as general preventive health recommendations were provided to patient.  See attached scanned questionnaire for additional information.   Signed,   Lindell Noe, MHA, BS, LPN Health Advisor [id number]

## 2015-09-08 NOTE — Progress Notes (Signed)
I reviewed health advisor's note, was available for consultation, and agree with documentation and plan.  

## 2015-09-13 ENCOUNTER — Ambulatory Visit (INDEPENDENT_AMBULATORY_CARE_PROVIDER_SITE_OTHER): Payer: Medicare Other | Admitting: Family Medicine

## 2015-09-13 ENCOUNTER — Encounter: Payer: Self-pay | Admitting: Family Medicine

## 2015-09-13 VITALS — BP 128/70 | HR 68 | Temp 97.6°F | Wt 211.0 lb

## 2015-09-13 DIAGNOSIS — Z7189 Other specified counseling: Secondary | ICD-10-CM

## 2015-09-13 DIAGNOSIS — J449 Chronic obstructive pulmonary disease, unspecified: Secondary | ICD-10-CM

## 2015-09-13 DIAGNOSIS — Z87891 Personal history of nicotine dependence: Secondary | ICD-10-CM

## 2015-09-13 DIAGNOSIS — Z0000 Encounter for general adult medical examination without abnormal findings: Secondary | ICD-10-CM

## 2015-09-13 DIAGNOSIS — E785 Hyperlipidemia, unspecified: Secondary | ICD-10-CM

## 2015-09-13 DIAGNOSIS — Z8546 Personal history of malignant neoplasm of prostate: Secondary | ICD-10-CM

## 2015-09-13 NOTE — Assessment & Plan Note (Signed)
Minimal symptoms with rare spiriva use.

## 2015-09-13 NOTE — Progress Notes (Signed)
BP 128/70 mmHg  Pulse 68  Temp(Src) 97.6 F (36.4 C) (Oral)  Wt 211 lb (95.709 kg)   CC: medicare wellness visit  Subjective:    Patient ID: Carlos Olson, male    DOB: 1941-03-18, 75 y.o.   MRN: [id number]  HPI: Carlos Olson is a 75 y.o. male presenting on 09/13/2015 for Annual Exam   Pleasant 75 yo with h/o prostate cancer s/p prostatectomy, HLD, COPD well controlled on spiriva, former smoker (quit 2008) presents for medicare wellness visit.   Had AMW visit 09/07/2015 with Carlos Olson.   Dry itchy skin with cold weather.   Hearing screen - trouble in the past. Has hearing aides but doesn't use Vision screen yearly with eye doctor last 07/2015 Denies depression, anhedonia, sadness.  No falls in last year.   Preventative: Colon 02/2013 - tortuous colon, diverticulosis and polyp, rpt 5 yrs (Carlos Olson) Prostate cancer - s/p prostatectomy 1990s Lung cancer screening - 100 PY history. Quit 2008 with chantix.  Flu shot yearly Td 2010  Pneumovax 2013, prevnar 2016 zostavax 2012  Received advanced directive and scanned into chart 08/2015. Carlos Olson then Carlos Olson are HCPOA.  Seat belt use discussed Sunscreen use discussed. No changing moles on skin.   Prefers I call him "Carlos Olson" Lives with wife. Grown children.  Occupation: retired, was in Insurance underwriter business  Activity: hates walking, R knee limits activity. Stretches knee regularly.  Diet: some water, vegetables daily  Relevant past medical, surgical, family and social history reviewed and updated as indicated. Interim medical history since our last visit reviewed. Allergies and medications reviewed and updated. Current Outpatient Prescriptions on File Prior to Visit  Medication Sig  . acetaminophen (TYLENOL) 500 MG tablet Take 500 mg by mouth every 6 (six) hours as needed.   No current facility-administered medications on file prior to visit.    Review of Systems  Constitutional: Negative for fever, chills,  activity change, appetite change, fatigue and unexpected weight change.  HENT: Negative for hearing loss.   Eyes: Negative for visual disturbance.  Respiratory: Negative for cough, chest tightness, shortness of breath and wheezing.   Cardiovascular: Negative for chest pain, palpitations and leg swelling.  Gastrointestinal: Negative for nausea, vomiting, abdominal pain, diarrhea, constipation, blood in stool and abdominal distention.  Genitourinary: Negative for hematuria and difficulty urinating.  Musculoskeletal: Negative for myalgias, arthralgias and neck pain.  Skin: Negative for rash.  Neurological: Negative for dizziness, seizures, syncope and headaches.  Hematological: Negative for adenopathy. Does not bruise/bleed easily.  Psychiatric/Behavioral: Negative for dysphoric mood. The patient is not nervous/anxious.    Per HPI unless specifically indicated in ROS section     Objective:    BP 128/70 mmHg  Pulse 68  Temp(Src) 97.6 F (36.4 C) (Oral)  Wt 211 lb (95.709 kg)  Wt Readings from Last 3 Encounters:  09/13/15 211 lb (95.709 kg)  09/07/15 213 lb 12 oz (96.956 kg)  07/04/15 206 lb (93.441 kg)    Physical Exam  Constitutional: He is oriented to person, place, and time. He appears well-developed and well-nourished. No distress.  HENT:  Head: Normocephalic and atraumatic.  Right Ear: Hearing, tympanic membrane, external ear and ear canal normal.  Left Ear: Hearing, tympanic membrane, external ear and ear canal normal.  Nose: Nose normal.  Mouth/Throat: Uvula is midline, oropharynx is clear and moist and mucous membranes are normal. No oropharyngeal exudate, posterior oropharyngeal edema or posterior oropharyngeal erythema.  Eyes: Conjunctivae and EOM are normal. Pupils are equal,  round, and reactive to light. No scleral icterus.  Neck: Normal range of motion. Neck supple. Carotid bruit is not present. No thyromegaly present.  Cardiovascular: Normal rate, regular rhythm,  normal heart sounds and intact distal pulses.   No murmur heard. Pulses:      Radial pulses are 2+ on the right side, and 2+ on the left side.  Pulmonary/Chest: Effort normal and breath sounds normal. No respiratory distress. He has no wheezes. He has no rales.  Abdominal: Soft. Bowel sounds are normal. He exhibits no distension and no mass. There is no tenderness. There is no rebound and no guarding.  Musculoskeletal: Normal range of motion. He exhibits no edema.  Lymphadenopathy:    He has no cervical adenopathy.  Neurological: He is alert and oriented to person, place, and time.  CN grossly intact, station and gait intact  Skin: Skin is warm and dry. No rash noted.  Psychiatric: He has a normal mood and affect. His behavior is normal. Judgment and thought content normal.  Nursing note and vitals reviewed.  Results for orders placed or performed in visit on 09/06/15  PSA  Result Value Ref Range   PSA 0.01 (L) 0.10 - 4.00 ng/mL  Lipid panel  Result Value Ref Range   Cholesterol 150 0 - 200 mg/dL   Triglycerides 72.0 0.0 - 149.0 mg/dL   HDL 52.60 >39.00 mg/dL   VLDL 14.4 0.0 - 40.0 mg/dL   LDL Cholesterol 83 0 - 99 mg/dL   Total CHOL/HDL Ratio 3    NonHDL [id number]   Basic metabolic panel  Result Value Ref Range   Sodium 143 135 - 145 mEq/L   Potassium 4.8 3.5 - 5.1 mEq/L   Chloride 107 96 - 112 mEq/L   CO2 31 19 - 32 mEq/L   Glucose, Bld 99 70 - 99 mg/dL   BUN 20 6 - 23 mg/dL   Creatinine, Ser 1.09 0.40 - 1.50 mg/dL   Calcium 9.4 8.4 - 10.5 mg/dL   GFR 70.02 >60.00 mL/min      Assessment & Plan:   Problem List Items Addressed This Visit    PROSTATE CANCER, HX OF    Stable PSA.      RESOLVED: HLD (hyperlipidemia)    Chronic, great control off meds. Will remove from problem list.      History of tobacco abuse    100 PY hx Will refer for lung cancer screening CT      Relevant Orders   Ambulatory Referral for Lung Cancer Scre   Health maintenance examination -  Primary    Preventative protocols reviewed and updated unless pt declined. Discussed healthy diet and lifestyle.       COPD (chronic obstructive pulmonary disease) (HCC)    Minimal symptoms with rare spiriva use.       Relevant Medications   tiotropium (SPIRIVA) 18 MCG inhalation capsule   Advanced care planning/counseling discussion    Received advanced directive and scanned into chart 08/2015. Carlos Olson then Carlos Olson are HCPOA.           Follow up plan: Return in about 1 year (around 09/12/2016), or as needed, for annual exam, prior fasting for blood work.

## 2015-09-13 NOTE — Assessment & Plan Note (Signed)
Received advanced directive and scanned into chart 08/2015. Carlos Olson then Carlos Olson are HCPOA.  

## 2015-09-13 NOTE — Assessment & Plan Note (Signed)
Stable PSA

## 2015-09-13 NOTE — Progress Notes (Signed)
Pre visit review using our clinic review tool, if applicable. No additional management support is needed unless otherwise documented below in the visit note. 

## 2015-09-13 NOTE — Assessment & Plan Note (Signed)
Preventative protocols reviewed and updated unless pt declined. Discussed healthy diet and lifestyle.  

## 2015-09-13 NOTE — Patient Instructions (Addendum)
We will refer you to lung cancer screening nurse to discuss possible CT scan. You are doing well today Return as needed or in 1 year for next physical.  Health Maintenance, Male A healthy lifestyle and preventative care can promote health and wellness.  Maintain regular health, dental, and eye exams.  Eat a healthy diet. Foods like vegetables, fruits, whole grains, low-fat dairy products, and lean protein foods contain the nutrients you need and are low in calories. Decrease your intake of foods high in solid fats, added sugars, and salt. Get information about a proper diet from your health care provider, if necessary.  Regular physical exercise is one of the most important things you can do for your health. Most adults should get at least 150 minutes of moderate-intensity exercise (any activity that increases your heart rate and causes you to sweat) each week. In addition, most adults need muscle-strengthening exercises on 2 or more days a week.   Maintain a healthy weight. The body mass index (BMI) is a screening tool to identify possible weight problems. It provides an estimate of body fat based on height and weight. Your health care provider can find your BMI and can help you achieve or maintain a healthy weight. For males 20 years and older:  A BMI below 18.5 is considered underweight.  A BMI of 18.5 to 24.9 is normal.  A BMI of 25 to 29.9 is considered overweight.  A BMI of 30 and above is considered obese.  Maintain normal blood lipids and cholesterol by exercising and minimizing your intake of saturated fat. Eat a balanced diet with plenty of fruits and vegetables. Blood tests for lipids and cholesterol should begin at age 59 and be repeated every 5 years. If your lipid or cholesterol levels are high, you are over age 66, or you are at high risk for heart disease, you may need your cholesterol levels checked more frequently.Ongoing high lipid and cholesterol levels should be treated  with medicines if diet and exercise are not working.  If you smoke, find out from your health care provider how to quit. If you do not use tobacco, do not start.  Lung cancer screening is recommended for adults aged 60-80 years who are at high risk for developing lung cancer because of a history of smoking. A yearly low-dose CT scan of the lungs is recommended for people who have at least a 30-pack-year history of smoking and are current smokers or have quit within the past 15 years. A pack year of smoking is smoking an average of 1 pack of cigarettes a day for 1 year (for example, a 30-pack-year history of smoking could mean smoking 1 pack a day for 30 years or 2 packs a day for 15 years). Yearly screening should continue until the smoker has stopped smoking for at least 15 years. Yearly screening should be stopped for people who develop a health problem that would prevent them from having lung cancer treatment.  If you choose to drink alcohol, do not have more than 2 drinks per day. One drink is considered to be 12 oz (360 mL) of beer, 5 oz (150 mL) of wine, or 1.5 oz (45 mL) of liquor.  Avoid the use of street drugs. Do not share needles with anyone. Ask for help if you need support or instructions about stopping the use of drugs.  High blood pressure causes heart disease and increases the risk of stroke. High blood pressure is more likely to develop in:  People who have blood pressure in the end of the normal range (100-139/85-89 mm Hg).  People who are overweight or obese.  People who are African American.  If you are 97-54 years of age, have your blood pressure checked every 3-5 years. If you are 52 years of age or older, have your blood pressure checked every year. You should have your blood pressure measured twice--once when you are at a hospital or clinic, and once when you are not at a hospital or clinic. Record the average of the two measurements. To check your blood pressure when you  are not at a hospital or clinic, you can use:  An automated blood pressure machine at a pharmacy.  A home blood pressure monitor.  If you are 90-24 years old, ask your health care provider if you should take aspirin to prevent heart disease.  Diabetes screening involves taking a blood sample to check your fasting blood sugar level. This should be done once every 3 years after age 30 if you are at a normal weight and without risk factors for diabetes. Testing should be considered at a younger age or be carried out more frequently if you are overweight and have at least 1 risk factor for diabetes.  Colorectal cancer can be detected and often prevented. Most routine colorectal cancer screening begins at the age of 69 and continues through age 39. However, your health care provider may recommend screening at an earlier age if you have risk factors for colon cancer. On a yearly basis, your health care provider may provide home test kits to check for hidden blood in the stool. A small camera at the end of a tube may be used to directly examine the colon (sigmoidoscopy or colonoscopy) to detect the earliest forms of colorectal cancer. Talk to your health care provider about this at age 39 when routine screening begins. A direct exam of the colon should be repeated every 5-10 years through age 63, unless early forms of precancerous polyps or small growths are found.  People who are at an increased risk for hepatitis B should be screened for this virus. You are considered at high risk for hepatitis B if:  You were born in a country where hepatitis B occurs often. Talk with your health care provider about which countries are considered high risk.  Your parents were born in a high-risk country and you have not received a shot to protect against hepatitis B (hepatitis B vaccine).  You have HIV or AIDS.  You use needles to inject street drugs.  You live with, or have sex with, someone who has hepatitis  B.  You are a man who has sex with other men (MSM).  You get hemodialysis treatment.  You take certain medicines for conditions like cancer, organ transplantation, and autoimmune conditions.  Hepatitis C blood testing is recommended for all people born from [birth year] through 1966 and any individual with known risk factors for hepatitis C.  Healthy men should no longer receive prostate-specific antigen (PSA) blood tests as part of routine cancer screening. Talk to your health care provider about prostate cancer screening.  Testicular cancer screening is not recommended for adolescents or adult males who have no symptoms. Screening includes self-exam, a health care provider exam, and other screening tests. Consult with your health care provider about any symptoms you have or any concerns you have about testicular cancer.  Practice safe sex. Use condoms and avoid high-risk sexual practices to reduce the spread of  sexually transmitted infections (STIs).  You should be screened for STIs, including gonorrhea and chlamydia if:  You are sexually active and are younger than 24 years.  You are older than 24 years, and your health care provider tells you that you are at risk for this type of infection.  Your sexual activity has changed since you were last screened, and you are at an increased risk for chlamydia or gonorrhea. Ask your health care provider if you are at risk.  If you are at risk of being infected with HIV, it is recommended that you take a prescription medicine daily to prevent HIV infection. This is called pre-exposure prophylaxis (PrEP). You are considered at risk if:  You are a man who has sex with other men (MSM).  You are a heterosexual man who is sexually active with multiple partners.  You take drugs by injection.  You are sexually active with a partner who has HIV.  Talk with your health care provider about whether you are at high risk of being infected with HIV. If you  choose to begin PrEP, you should first be tested for HIV. You should then be tested every 3 months for as long as you are taking PrEP.  Use sunscreen. Apply sunscreen liberally and repeatedly throughout the day. You should seek shade when your shadow is shorter than you. Protect yourself by wearing long sleeves, pants, a wide-brimmed hat, and sunglasses year round whenever you are outdoors.  Tell your health care provider of new moles or changes in moles, especially if there is a change in shape or color. Also, tell your health care provider if a mole is larger than the size of a pencil eraser.  A one-time screening for abdominal aortic aneurysm (AAA) and surgical repair of large AAAs by ultrasound is recommended for men aged 68-75 years who are current or former smokers.  Stay current with your vaccines (immunizations).   This information is not intended to replace advice given to you by your health care provider. Make sure you discuss any questions you have with your health care provider.   Document Released: 12/13/2007 Document Revised: 07/07/2014 Document Reviewed: 11/11/2010 Elsevier Interactive Patient Education [employer].

## 2015-09-13 NOTE — Assessment & Plan Note (Signed)
100 PY hx Will refer for lung cancer screening CT

## 2015-09-13 NOTE — Assessment & Plan Note (Addendum)
Chronic, great control off meds. Will remove from problem list.

## 2015-09-14 ENCOUNTER — Encounter: Payer: Medicare Other | Admitting: Family Medicine

## 2015-09-20 ENCOUNTER — Telehealth: Payer: Self-pay | Admitting: Acute Care

## 2015-09-20 DIAGNOSIS — Z87891 Personal history of nicotine dependence: Secondary | ICD-10-CM

## 2015-09-20 NOTE — Telephone Encounter (Signed)
Called discussed lung screening program/questionnaire with pt. He does qualify for program and appt scheduled with Sarah 3/28 at 12pm.

## 2015-09-25 ENCOUNTER — Encounter: Payer: Self-pay | Admitting: Acute Care

## 2015-09-25 ENCOUNTER — Ambulatory Visit (INDEPENDENT_AMBULATORY_CARE_PROVIDER_SITE_OTHER): Payer: Medicare Other | Admitting: Acute Care

## 2015-09-25 ENCOUNTER — Ambulatory Visit (INDEPENDENT_AMBULATORY_CARE_PROVIDER_SITE_OTHER)
Admission: RE | Admit: 2015-09-25 | Discharge: 2015-09-25 | Disposition: A | Discharge status: Home / Self Care | Payer: Medicare Other | Source: Ambulatory Visit | Attending: Acute Care | Admitting: Acute Care

## 2015-09-25 ENCOUNTER — Other Ambulatory Visit: Payer: Self-pay | Admitting: Acute Care

## 2015-09-25 ENCOUNTER — Encounter: Payer: Self-pay | Admitting: Family Medicine

## 2015-09-25 DIAGNOSIS — Z87891 Personal history of nicotine dependence: Secondary | ICD-10-CM

## 2015-09-25 DIAGNOSIS — I2510 Atherosclerotic heart disease of native coronary artery without angina pectoris: Secondary | ICD-10-CM

## 2015-09-25 DIAGNOSIS — I700 Atherosclerosis of aorta: Secondary | ICD-10-CM

## 2015-09-25 DIAGNOSIS — R918 Other nonspecific abnormal finding of lung field: Secondary | ICD-10-CM | POA: Insufficient documentation

## 2015-09-25 HISTORY — DX: Other nonspecific abnormal finding of lung field: R91.8

## 2015-09-25 HISTORY — DX: Atherosclerotic heart disease of native coronary artery without angina pectoris: I25.10

## 2015-09-25 HISTORY — DX: Atherosclerosis of aorta: I70.0

## 2015-09-25 NOTE — Progress Notes (Signed)
Shared Decision Making Visit Lung Cancer Screening Program [id number])   Eligibility:  Age 75 y.o.  Pack Years Smoking History Calculation 104 -pack-year history (# packs/per year x # years smoked)  Recent History of coughing up blood  No  Unexplained weight loss? no ( >Than 15 pounds within the last 6 months )  Prior History Lung / other cancer no (Diagnosis within the last 5 years already requiring surveillance chest CT Scans).  Smoking Status former smoker  Former Smokers: Years since quit: 9 years  Quit Date: 2008  Visit Components:  Discussion included one or more decision making aids. yes  Discussion included risk/benefits of screening. yes  Discussion included potential follow up diagnostic testing for abnormal scans. yes  Discussion included meaning and risk of over diagnosis. yes  Discussion included meaning and risk of False Positives. yes  Discussion included meaning of total radiation exposure. yes  Counseling Included:  Importance of adherence to annual lung cancer LDCT screening. yes  Impact of comorbidities on ability to participate in the program. yes  Ability and willingness to under go diagnostic treatment. yes  Smoking Cessation Counseling:  Current Smokers:   Discussed importance of smoking cessation. Former smoker not applicable  Information about tobacco cessation classes and interventions provided to patient. Former smoker not applicable  Patient provided with "ticket" for LDCT Scan. yes  Symptomatic Patient. no  Counseling not applicable  Diagnosis Code: Tobacco Use Z72.0  Asymptomatic Patient yes  Counseling not applicable former smoker  Former Smokers:   Discussed the importance of maintaining cigarette abstinence. yes  Diagnosis Code: Personal History of Nicotine Dependence. [id number]  Information about tobacco cessation classes and interventions provided to patient. Yes  Patient provided with "ticket" for LDCT Scan.  yes  Written Order for Lung Cancer Screening with LDCT placed in Epic. Yes  (CT Chest Lung Cancer Screening Low Dose W/O CM) [id number] Z12.2-Screening of respiratory organs Z87.891-Personal history of nicotine dependence  I spent 20 minutes of face to face time with Mr. Aline August and his wife discussing the risks and benefits of lung cancer screening. We viewed a power point together that explained in detail the above noted topics. We took the time to pause the power point at intervals to allow for questions to be asked and answered to ensure understanding. We discussed that he had taken the single most powerful action possible to decrease his risk of developing lung cancer when he quit smoking. I counseled Mr. Lowery to remain smoke free, and to contact me if he ever had the desire to smoke again so that I can provide resources and tools to help support the effort to remain smoke free. We discussed the time and location of the scan, and that either [place] or I will call with the results within  24-48 hours of receiving them. Mr. Aline August has my card and contact information in the event he needs to speak with me, in addition to a copy of the power point we reviewed as a resource. Mr. Aline August verbalized understanding of all of the above and had no further questions upon leaving the office.    Magdalen Spatz, NP  09/25/2015

## 2016-04-11 DIAGNOSIS — Z23 Encounter for immunization: Secondary | ICD-10-CM | POA: Diagnosis not present

## 2016-09-17 ENCOUNTER — Other Ambulatory Visit: Payer: Self-pay | Admitting: Family Medicine

## 2016-09-17 DIAGNOSIS — I700 Atherosclerosis of aorta: Secondary | ICD-10-CM

## 2016-09-17 DIAGNOSIS — Z8546 Personal history of malignant neoplasm of prostate: Secondary | ICD-10-CM

## 2016-09-18 ENCOUNTER — Ambulatory Visit (INDEPENDENT_AMBULATORY_CARE_PROVIDER_SITE_OTHER): Payer: Medicare HMO

## 2016-09-18 VITALS — BP 130/80 | HR 64 | Temp 98.0°F | Ht 75.75 in | Wt 211.5 lb

## 2016-09-18 DIAGNOSIS — Z8546 Personal history of malignant neoplasm of prostate: Secondary | ICD-10-CM

## 2016-09-18 DIAGNOSIS — I700 Atherosclerosis of aorta: Secondary | ICD-10-CM

## 2016-09-18 DIAGNOSIS — Z0000 Encounter for general adult medical examination without abnormal findings: Secondary | ICD-10-CM | POA: Diagnosis not present

## 2016-09-18 LAB — BASIC METABOLIC PANEL
BUN: 16 mg/dL (ref 6–23)
CALCIUM: 9.6 mg/dL (ref 8.4–10.5)
CO2: 27 mEq/L (ref 19–32)
Chloride: 105 mEq/L (ref 96–112)
Creatinine, Ser: 0.94 mg/dL (ref 0.40–1.50)
GFR: 82.84 mL/min (ref >60.00)
Glucose, Bld: 85 mg/dL (ref 70–99)
POTASSIUM: 4.9 meq/L (ref 3.5–5.1)
Sodium: 140 mEq/L (ref 135–145)

## 2016-09-18 LAB — CBC WITH DIFFERENTIAL/PLATELET
BASOS ABS: 0.1 10*3/uL (ref 0.0–0.1)
Basophils Relative: 0.9 % (ref 0.0–3.0)
Eosinophils Absolute: 0.2 10*3/uL (ref 0.0–0.7)
Eosinophils Relative: 3.5 % (ref 0.0–5.0)
HCT: 45.3 % (ref 39.0–52.0)
HEMOGLOBIN: 15.4 g/dL (ref 13.0–17.0)
LYMPHS ABS: 1.4 10*3/uL (ref 0.7–4.0)
LYMPHS PCT: 24.5 % (ref 12.0–46.0)
MCHC: 34.1 g/dL (ref 30.0–36.0)
MCV: 98.8 fl (ref 78.0–100.0)
MONOS PCT: 10.7 % (ref 3.0–12.0)
Monocytes Absolute: 0.6 10*3/uL (ref 0.1–1.0)
NEUTROS PCT: 60.4 % (ref 43.0–77.0)
Neutro Abs: 3.5 10*3/uL (ref 1.4–7.7)
Platelets: 191 10*3/uL (ref 150.0–400.0)
RBC: 4.58 Mil/uL (ref 4.22–5.81)
RDW: 13.3 % (ref 11.5–15.5)
WBC: 5.9 10*3/uL (ref 4.0–10.5)

## 2016-09-18 LAB — LIPID PANEL
Cholesterol: 170 mg/dL (ref 0–200)
HDL: 57.6 mg/dL (ref >39.00)
LDL CALC: 101 mg/dL — AB (ref 0–99)
NONHDL: 111.93
Total CHOL/HDL Ratio: 3
Triglycerides: 53 mg/dL (ref 0.0–149.0)
VLDL: 10.6 mg/dL (ref 0.0–40.0)

## 2016-09-18 LAB — PSA: PSA: 0.01 ng/mL — AB (ref 0.10–4.00)

## 2016-09-18 NOTE — Patient Instructions (Signed)
Carlos Olson , Thank you for taking time to come for your Medicare Wellness Visit. I appreciate your ongoing commitment to your health goals. Please review the following plan we discussed and let me know if I can assist you in the future.   These are the goals we discussed: Goals    . Increase physical activity          Starting 09/18/2016, I will continue to do stretching exercises for 30 min 3 days per week.        This is a list of the screening recommended for you and due dates:  Health Maintenance  Topic Date Due  . DTaP/Tdap/Td vaccine (1 - Tdap) 04/20/2019*  . Tetanus Vaccine  04/20/2019  . Flu Shot  Addressed  . Pneumonia vaccines  Completed  *Topic was postponed. The date shown is not the original due date.   Preventive Care for Adults  A healthy lifestyle and preventive care can promote health and wellness. Preventive health guidelines for adults include the following key practices.  . A routine yearly physical is a good way to check with your health care provider about your health and preventive screening. It is a chance to share any concerns and updates on your health and to receive a thorough exam.  . Visit your dentist for a routine exam and preventive care every 6 months. Brush your teeth twice a day and floss once a day. Good oral hygiene prevents tooth decay and gum disease.  . The frequency of eye exams is based on your age, health, family medical history, use  of contact lenses, and other factors. Follow your health care provider's ecommendations for frequency of eye exams.  . Eat a healthy diet. Foods like vegetables, fruits, whole grains, low-fat dairy products, and lean protein foods contain the nutrients you need without too many calories. Decrease your intake of foods high in solid fats, added sugars, and salt. Eat the right amount of calories for you. Get information about a proper diet from your health care provider, if necessary.  . Regular physical exercise  is one of the most important things you can do for your health. Most adults should get at least 150 minutes of moderate-intensity exercise (any activity that increases your heart rate and causes you to sweat) each week. In addition, most adults need muscle-strengthening exercises on 2 or more days a week.  Silver Sneakers may be a benefit available to you. To determine eligibility, you may visit the website: www.silversneakers.com or contact program at 218-598-2064 Mon-Fri between 8AM-8PM.   . Maintain a healthy weight. The body mass index (BMI) is a screening tool to identify possible weight problems. It provides an estimate of body fat based on height and weight. Your health care provider can find your BMI and can help you achieve or maintain a healthy weight.   For adults 20 years and older: ? A BMI below 18.5 is considered underweight. ? A BMI of 18.5 to 24.9 is normal. ? A BMI of 25 to 29.9 is considered overweight. ? A BMI of 30 and above is considered obese.   . Maintain normal blood lipids and cholesterol levels by exercising and minimizing your intake of saturated fat. Eat a balanced diet with plenty of fruit and vegetables. Blood tests for lipids and cholesterol should begin at age 70 and be repeated every 5 years. If your lipid or cholesterol levels are high, you are over 50, or you are at high risk for heart disease,  you may need your cholesterol levels checked more frequently. Ongoing high lipid and cholesterol levels should be treated with medicines if diet and exercise are not working.  . If you smoke, find out from your health care provider how to quit. If you do not use tobacco, please do not start.  . If you choose to drink alcohol, please do not consume more than 2 drinks per day. One drink is considered to be 12 ounces (355 mL) of beer, 5 ounces (148 mL) of wine, or 1.5 ounces (44 mL) of liquor.  . If you are 56-94 years old, ask your health care provider if you should take  aspirin to prevent strokes.  . Use sunscreen. Apply sunscreen liberally and repeatedly throughout the day. You should seek shade when your shadow is shorter than you. Protect yourself by wearing long sleeves, pants, a wide-brimmed hat, and sunglasses year round, whenever you are outdoors.  . Once a month, do a whole body skin exam, using a mirror to look at the skin on your back. Tell your health care provider of new moles, moles that have irregular borders, moles that are larger than a pencil eraser, or moles that have changed in shape or color.

## 2016-09-18 NOTE — Progress Notes (Signed)
PCP notes:   Health maintenance:  No gaps identified.   Abnormal screenings:   None  Patient concerns:   Patient has concern about pain and use of Tylenol.  Nurse concerns:  None  Next PCP appt:   09/22/16 @ 0930

## 2016-09-18 NOTE — Progress Notes (Signed)
Pre visit review using our clinic review tool, if applicable. No additional management support is needed unless otherwise documented below in the visit note. 

## 2016-09-18 NOTE — Progress Notes (Signed)
Subjective:   Carlos Olson is a 76 y.o. male who presents for Medicare Annual/Subsequent preventive examination.  Review of Systems:  N/A Cardiac Risk Factors include: advanced age (>72men, >41 women);male gender;dyslipidemia     Objective:    Vitals: BP 130/80 (BP Location: Right Arm, Patient Position: Sitting, Cuff Size: Normal)   Pulse 64   Temp 98 F (36.7 C) (Oral)   Ht 6' 3.75" (1.924 m) Comment: shoes  Wt 211 lb 8 oz (95.9 kg)   SpO2 93%   BMI 25.91 kg/m   Body mass index is 25.91 kg/m.  Tobacco History  Smoking Status  . Former Smoker  . Packs/day: 2.00  . Years: 52.00  . Types: Cigarettes  . Quit date: 05/15/2007  Smokeless Tobacco  . Never Used    Comment: quit 06/16/2007/encouraged to remain smoke free     Counseling given: No   Past Medical History:  Diagnosis Date  . Atherosclerosis of aortic arch ([place]) 09/25/2015   By CT scan   . CAD (coronary artery disease) 09/25/2015   Mild 3v by CT scan   . Colon polyps   . COPD (chronic obstructive pulmonary disease) ([place])   . Diverticulosis of colon   . Femoral bruit    bilateral, found by Dr. Jefm Bryant  . Heart murmur    child  . History of prostate cancer   . IBS (irritable bowel syndrome)    improved after retirement  . 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 Pelvic fracture ([place])   . Pneumonia    child  . Pulmonary nodules 09/25/2015   On screening CT scan, rpt 1 yr   . Shortness of breath    doe   Past Surgical History:  Procedure Laterality Date  . ANTERIOR CERVICAL DECOMP/DISCECTOMY FUSION  07/29/2011   Procedure: ANTERIOR CERVICAL DECOMPRESSION/DISCECTOMY FUSION 1 LEVEL;  Surgeon: Jessy Oto, MD;  Location: [place];  Service: Orthopedics;  Laterality: N/A;  Anterior Cervical Discectomy and Fusion C3-4  . CATARACT EXTRACTION W/PHACO Right 07/10/2015   Procedure: CATARACT EXTRACTION PHACO AND INTRAOCULAR LENS PLACEMENT (IOC);  Surgeon: Birder Robson, MD;  Location: [facility];  Service: Ophthalmology;  Laterality: Right;  [place] 00:58   . CATARACT EXTRACTION W/PHACO Left 07/24/2015   Procedure: CATARACT EXTRACTION PHACO AND INTRAOCULAR LENS PLACEMENT (IOC);  Surgeon: Birder Robson, MD;  Location: [facility];  Service: Ophthalmology;  Laterality: Left;  [place] 00:38   . COLONOSCOPY  12/2007   [place], diverticulosis and polyp, rpt 5 yrs  . FORAMINOTOMY 2 LEVEL Left 10/2013   C4/5, C5/6 ([place])  . HERNIA REPAIR     right 05/1999, left 09/04/05  . KNEE ARTHROSCOPY  09/1997   left  . POSTERIOR CERVICAL FUSION/FORAMINOTOMY N/A 11/22/2013   Procedure: LEFT C4-5 AND C5-6 FORAMINOTOMY;  Surgeon: Jessy Oto, MD;  Location: [place];  Service: Orthopedics;  Laterality: N/A;  . PROSTATECTOMY  08/1990  . 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 TONSILLECTOMY     Family History  Problem Relation Age of Onset  . Hypertension Mother   . Cancer Father     bone, prostate  . Diabetes Sister     x 40 years  . Cancer Brother     prostate   History  Sexual Activity  . Sexual activity: No    Outpatient Encounter Prescriptions as of 09/18/2016  Medication Sig  . acetaminophen (TYLENOL) 500 MG tablet Take 500 mg by mouth every  6 (six) hours as needed.  . tiotropium (SPIRIVA) 18 MCG inhalation capsule Place 18 mcg into inhaler and inhale as needed.   No facility-administered encounter medications on file as of 09/18/2016.     Activities of Daily Living In your present state of health, do you have any difficulty performing the following activities: 09/18/2016  Hearing? Y  Vision? N  Difficulty concentrating or making decisions? N  Walking or climbing stairs? Y  Dressing or bathing? N  Doing errands, shopping? N  Preparing Food and eating ? N  Using the Toilet? N  In the past six months, have you accidently leaked urine? N  Do you have problems with loss of bowel control? N  Managing your Medications? N  Managing your Finances? N  Housekeeping  or managing your Housekeeping? N  Some recent data might be hidden    Patient Care Team: Ria Bush, MD as PCP - General (Family Medicine) Trula Slade, DPM as Consulting Physician (Podiatry) Birder Robson, MD as Referring Physician (Ophthalmology)   Assessment:    PLEASE NOTE: A Mini-Cog screen was completed. Maximum score is 20. A value of 0 denotes this part of [place]stein MMSE was not completed or the patient failed this part of the Mini-Cog screening.   Mini-Cog Screening Orientation to Time - Max 5 pts Orientation to Place - Max 5 pts Registration - Max 3 pts Recall - Max 3 pts Language Repeat - Max 1 pts Language Follow 3 Step Command - Max 3 pts  Exercise Activities and Dietary recommendations Current Exercise Habits: Home exercise routine, Type of exercise: stretching;Other - see comments (leg exercises), Time (Minutes): 15, Frequency (Times/Week): 3, Weekly Exercise (Minutes/Week): 45, Intensity: Mild, Exercise limited by: None identified  Goals    . Increase physical activity          Starting 09/18/2016, I will continue to do stretching exercises for 30 min 3 days per week.       Fall Risk Fall Risk  09/18/2016 09/07/2015 09/08/2014 06/24/2013 06/22/2012  Falls in the past year? No No No Yes Yes  Number falls in past yr: - - - 1 1  Injury with Fall? - - - No Yes  Risk Factor Category  - - - - High Fall Risk  Risk for fall due to : - - - - History of fall(s)   Depression Screen PHQ 2/9 Scores 09/18/2016 09/07/2015 09/08/2014 06/24/2013  PHQ - 2 Score 0 0 0 0    Cognitive Function MMSE - Mini Mental State Exam 09/18/2016 09/07/2015  Orientation to time 5 5  Orientation to Place 5 5  Registration 3 3  Attention/ Calculation 0 5  Recall 3 3  Language- name 2 objects 0 0  Language- repeat 1 1  Language- follow 3 step command 3 3  Language- read & follow direction 0 1  Write a sentence 0 0  Copy design 0 0  Total score 20 26       PLEASE NOTE: A  Mini-Cog screen was completed. Maximum score is 20. A value of 0 denotes this part of Folstein MMSE was not completed or the patient failed this part of the Mini-Cog screening.   Mini-Cog Screening Orientation to Time - Max 5 pts Orientation to Place - Max 5 pts Registration - Max 3 pts Recall - Max 3 pts Language Repeat - Max 1 pts Language Follow 3 Step Command - Max 3 pts   Immunization History  Administered Date(s) Administered  .  Influenza Split 03/27/2011  . Influenza Whole 04/27/2001, 03/19/2009, 03/30/2013  . Influenza, High Dose Seasonal PF 04/21/2014, 04/11/2016  . Influenza-Unspecified 03/31/2015  . Pneumococcal Conjugate-13 09/08/2014  . Pneumococcal Polysaccharide-23 04/27/2001, 06/22/2012  . Td 08/28/1998, 04/19/2009  . Zoster 03/27/2011   Screening Tests Health Maintenance  Topic Date Due  . DTaP/Tdap/Td (1 - Tdap) 04/20/2019 (Originally 04/20/2009)  . TETANUS/TDAP  04/20/2019  . INFLUENZA VACCINE  Addressed  . PNA vac Low Risk Adult  Completed      Plan:     I have personally reviewed and addressed the Medicare Annual Wellness questionnaire and have noted the following in the patient's chart:  A. Medical and social history B. Use of alcohol, tobacco or illicit drugs  C. Current medications and supplements D. Functional ability and status E.  Nutritional status F.  Physical activity G. Advance directives H. List of other physicians I.  Hospitalizations, surgeries, and ER visits in previous 12 months J.  [place] to include hearing, vision, cognitive, depression L. Referrals and appointments - none  In addition, I have reviewed and discussed with patient certain preventive protocols, quality metrics, and best practice recommendations. A written personalized care plan for preventive services as well as general preventive health recommendations were provided to patient.  See attached scanned questionnaire for additional information.   Signed,    Lindell Noe, MHA, BS, LPN Health Coach'

## 2016-09-21 NOTE — Progress Notes (Signed)
I reviewed health advisor's note, was available for consultation, and agree with documentation and plan.  

## 2016-09-22 ENCOUNTER — Ambulatory Visit (INDEPENDENT_AMBULATORY_CARE_PROVIDER_SITE_OTHER): Payer: Medicare HMO | Admitting: Family Medicine

## 2016-09-22 ENCOUNTER — Encounter: Payer: Self-pay | Admitting: Family Medicine

## 2016-09-22 VITALS — BP 134/82 | HR 60 | Temp 97.8°F | Wt 211.0 lb

## 2016-09-22 DIAGNOSIS — J432 Centrilobular emphysema: Secondary | ICD-10-CM | POA: Diagnosis not present

## 2016-09-22 DIAGNOSIS — M1990 Unspecified osteoarthritis, unspecified site: Secondary | ICD-10-CM | POA: Insufficient documentation

## 2016-09-22 DIAGNOSIS — M150 Primary generalized (osteo)arthritis: Secondary | ICD-10-CM | POA: Diagnosis not present

## 2016-09-22 DIAGNOSIS — Z7189 Other specified counseling: Secondary | ICD-10-CM

## 2016-09-22 DIAGNOSIS — I700 Atherosclerosis of aorta: Secondary | ICD-10-CM

## 2016-09-22 DIAGNOSIS — I2510 Atherosclerotic heart disease of native coronary artery without angina pectoris: Secondary | ICD-10-CM

## 2016-09-22 DIAGNOSIS — Z8546 Personal history of malignant neoplasm of prostate: Secondary | ICD-10-CM | POA: Diagnosis not present

## 2016-09-22 DIAGNOSIS — M159 Polyosteoarthritis, unspecified: Secondary | ICD-10-CM

## 2016-09-22 DIAGNOSIS — R918 Other nonspecific abnormal finding of lung field: Secondary | ICD-10-CM

## 2016-09-22 DIAGNOSIS — Z0000 Encounter for general adult medical examination without abnormal findings: Secondary | ICD-10-CM

## 2016-09-22 MED ORDER — TIOTROPIUM BROMIDE MONOHYDRATE 18 MCG IN CAPS: 18.0000 ug | ORAL_CAPSULE | RESPIRATORY_TRACT | 9 refills | Status: DC | PRN

## 2016-09-22 MED ORDER — NAPROXEN SODIUM 220 MG PO TABS: 220.0000 mg | ORAL_TABLET | Freq: Every day | ORAL | Status: DC

## 2016-09-22 MED ORDER — GLUCOSAMINE 500 MG PO CAPS: 1.0000 | ORAL_CAPSULE | Freq: Every day | ORAL | 0 refills | Status: DC

## 2016-09-22 NOTE — Assessment & Plan Note (Signed)
Consider aspirin, statin. FLP stable.

## 2016-09-22 NOTE — Progress Notes (Signed)
Pre visit review using our clinic review tool, if applicable. No additional management support is needed unless otherwise documented below in the visit note. 

## 2016-09-22 NOTE — Assessment & Plan Note (Signed)
Yearly CT screening x 5 yrs started 2017.

## 2016-09-22 NOTE — Progress Notes (Signed)
BP 134/82   Pulse 60   Temp 97.8 F (36.6 C) (Oral)   Wt 211 lb (95.7 kg)   BMI 25.85 kg/m    CC: CPE Subjective:    Patient ID: Carlos Olson, male    DOB: 1941-05-05, 76 y.o.   MRN: [id number]  HPI: Carlos Olson is a 76 y.o. male presenting on 09/22/2016 for Annual Exam   Saw Katha Cabal last week for medicare wellness visit. Note reviewed.  Recent stressful move over last 6 months.   Pleasant 76 yo with h/o prostate cancer s/p prostatectomy, HLD, COPD well controlled on rare spiriva, former smoker (quit 2008).   Cough in am since Christmas. Occasional green sputum. Occasional dyspnea worse with pollen. CT 08/2015 showed mod centrilobular and paraseptal emphysema. No fevers.   Over last year noticing increased body aches. Ongoing L hand soreness/tingling, and stays cold. Paresthesias of first 3rd fingers. Taking tylenol 4-6 tablets per day. R handed.   Hearing screen - trouble in the past. Has hearing aides but doesn't use Vision screen yearly with eye doctor last 07/2015 Denies depression, anhedonia, sadness.  No falls in last year.   Preventative: Colon 02/2013 - tortuous colon, diverticulosis and polyp, rpt 5 yrs ([place]) Prostate cancer - s/p prostatectomy 1990s Lung cancer screening - 100 PY history. Quit 2008 with chantix. Screening CT chest 08/2015 - small R lung nodules.  Flu shot yearly Td 2010  Pneumovax 2013, prevnar 2016 zostavax 2012  Received advanced directive and scanned into chart 08/2015. Teodora Medici then Wandra Feinstein are HCPOA.  Seat belt use discussed Sunscreen use discussed. No changing moles on skin.  Ex smoker - quit 2008 Alcohol - 2 drinks/day  Prefers I call him "Carlos Olson" Lives with wife. Grown children.  Occupation: retired, was in Insurance underwriter business  Activity: hates walking, R knee limits activity. Stretches knee regularly.  Diet: some water, vegetables daily  Relevant past medical, surgical, family and social history reviewed and  updated as indicated. Interim medical history since our last visit reviewed. Allergies and medications reviewed and updated. Outpatient Medications Prior to Visit  Medication Sig Dispense Refill  . acetaminophen (TYLENOL) 500 MG tablet Take 500 mg by mouth every 6 (six) hours as needed.    . tiotropium (SPIRIVA) 18 MCG inhalation capsule Place 18 mcg into inhaler and inhale as needed.     No facility-administered medications prior to visit.      Per HPI unless specifically indicated in ROS section below Review of Systems  Constitutional: Negative for activity change, appetite change, chills, fatigue, fever and unexpected weight change.  HENT: Negative for hearing loss.   Eyes: Negative for visual disturbance.  Respiratory: Negative for cough, chest tightness, shortness of breath and wheezing.   Cardiovascular: Negative for chest pain, palpitations and leg swelling.  Gastrointestinal: Negative for abdominal distention, abdominal pain, blood in stool, constipation, diarrhea, nausea and vomiting.  Genitourinary: Negative for difficulty urinating and hematuria.  Musculoskeletal: Negative for arthralgias, myalgias and neck pain.  Skin: Negative for rash.  Neurological: Negative for dizziness, seizures, syncope and headaches.  Hematological: Negative for adenopathy. Does not bruise/bleed easily.  Psychiatric/Behavioral: Negative for dysphoric mood. The patient is not nervous/anxious.        Objective:    BP 134/82   Pulse 60   Temp 97.8 F (36.6 C) (Oral)   Wt 211 lb (95.7 kg)   BMI 25.85 kg/m   Wt Readings from Last 3 Encounters:  09/22/16 211 lb (95.7 kg)  09/18/16  211 lb 8 oz (95.9 kg)  09/13/15 211 lb (95.7 kg)    Physical Exam  Constitutional: He is oriented to person, place, and time. He appears well-developed and well-nourished. No distress.  HENT:  Head: Normocephalic and atraumatic.  Right Ear: Hearing, tympanic membrane, external ear and ear canal normal.  Left Ear:  Hearing, tympanic membrane, external ear and ear canal normal.  Nose: Nose normal.  Mouth/Throat: Uvula is midline, oropharynx is clear and moist and mucous membranes are normal. No oropharyngeal exudate, posterior oropharyngeal edema or posterior oropharyngeal erythema.  Eyes: Conjunctivae and EOM are normal. Pupils are equal, round, and reactive to light. No scleral icterus.  Neck: Normal range of motion. Neck supple. Carotid bruit is not present. No thyromegaly present.  Cardiovascular: Normal rate, regular rhythm, normal heart sounds and intact distal pulses.   No murmur heard. Pulses:      Radial pulses are 2+ on the right side, and 2+ on the left side.  Pulmonary/Chest: Effort normal and breath sounds normal. No respiratory distress. He has no wheezes. He has no rales.  Abdominal: Soft. Bowel sounds are normal. He exhibits no distension and no mass. There is no tenderness. There is no rebound and no guarding.  Musculoskeletal: Normal range of motion. He exhibits no edema.  Lymphadenopathy:    He has no cervical adenopathy.  Neurological: He is alert and oriented to person, place, and time.  CN grossly intact, station and gait intact Recall 3/3 Calculation 5/5 serial 3s  Skin: Skin is warm and dry. No rash noted.  Psychiatric: He has a normal mood and affect. His behavior is normal. Judgment and thought content normal.  Nursing note and vitals reviewed.  Results for orders placed or performed in visit on 09/18/16  PSA  Result Value Ref Range   PSA 0.01 (L) 0.10 - 4.00 ng/mL  Basic metabolic panel  Result Value Ref Range   Sodium 140 135 - 145 mEq/L   Potassium 4.9 3.5 - 5.1 mEq/L   Chloride 105 96 - 112 mEq/L   CO2 27 19 - 32 mEq/L   Glucose, Bld 85 70 - 99 mg/dL   BUN 16 6 - 23 mg/dL   Creatinine, Ser 0.94 0.40 - 1.50 mg/dL   Calcium 9.6 8.4 - 10.5 mg/dL   GFR 82.84 >60.00 mL/min  CBC with Differential/Platelet  Result Value Ref Range   WBC 5.9 4.0 - 10.5 K/uL   RBC 4.58  4.22 - 5.81 Mil/uL   Hemoglobin 15.4 13.0 - 17.0 g/dL   HCT 45.3 39.0 - 52.0 %   MCV 98.8 78.0 - 100.0 fl   MCHC 34.1 30.0 - 36.0 g/dL   RDW 13.3 11.5 - 15.5 %   Platelets 191.0 150.0 - 400.0 K/uL   Neutrophils Relative % 60.4 43.0 - 77.0 %   Lymphocytes Relative 24.5 12.0 - 46.0 %   Monocytes Relative 10.7 3.0 - 12.0 %   Eosinophils Relative 3.5 0.0 - 5.0 %   Basophils Relative 0.9 0.0 - 3.0 %   Neutro Abs 3.5 1.4 - 7.7 K/uL   Lymphs Abs 1.4 0.7 - 4.0 K/uL   Monocytes Absolute 0.6 0.1 - 1.0 K/uL   Eosinophils Absolute 0.2 0.0 - 0.7 K/uL   Basophils Absolute 0.1 0.0 - 0.1 K/uL  Lipid panel  Result Value Ref Range   Cholesterol 170 0 - 200 mg/dL   Triglycerides 53.0 0.0 - 149.0 mg/dL   HDL 57.60 >39.00 mg/dL   VLDL 10.6 0.0 -  40.0 mg/dL   LDL Cholesterol 101 (H) 0 - 99 mg/dL   Total CHOL/HDL Ratio 3    NonHDL 111.93       Assessment & Plan:   Problem List Items Addressed This Visit    Advanced care planning/counseling discussion    Received advanced directive and scanned into chart 08/2015. Teodora Medici then Wandra Feinstein are HCPOA.       Atherosclerosis of aortic arch ([facility])    Consider aspirin, statin. FLP stable.       CAD (coronary artery disease)    Mild by CT. Discussed with patient.       COPD (chronic obstructive pulmonary disease) ([facility])    Chronic, stable. Controlled with PRN spiriva. Finds pollen and cold weather are triggers.       Relevant Medications   tiotropium (SPIRIVA) 18 MCG inhalation capsule   Health maintenance examination - Primary    Preventative protocols reviewed and updated unless pt declined. Discussed healthy diet and lifestyle.       Osteoarthritis    Worsening body aches, in setting of recent move. Pt has increased tylenol use. rec limiting to 3000mg  daily.  Discussed addition of aleve nightly, glucosamine as well. Discussed risks of aleve including GI upset, GERD, kidney injury. Pt will monitor and use judiciously.        Relevant Medications   naproxen sodium (ALEVE) 220 MG tablet   PROSTATE CANCER, HX OF    PSA reassuring.       Pulmonary nodules    Yearly CT screening x 5 yrs started 2017.          Follow up plan: Return in about 1 year (around 09/22/2017) for annual exam, prior fasting for blood work.  Ria Bush, MD

## 2016-09-22 NOTE — Assessment & Plan Note (Signed)
Preventative protocols reviewed and updated unless pt declined. Discussed healthy diet and lifestyle.  

## 2016-09-22 NOTE — Assessment & Plan Note (Signed)
Received advanced directive and scanned into chart 08/2015. Carlos Olson then Carlos Olson are HCPOA.

## 2016-09-22 NOTE — Assessment & Plan Note (Addendum)
Worsening body aches, in setting of recent move. Pt has increased tylenol use. rec limiting to 3000mg  daily.  Discussed addition of aleve nightly, glucosamine as well. Discussed risks of aleve including GI upset, GERD, kidney injury. Pt will monitor and use judiciously.

## 2016-09-22 NOTE — Patient Instructions (Addendum)
Spiriva refilled. Consider plain mucinex with large glass of water.  Limit tylenol to 3000mg  daily. May add aleve 1 tablet daily as needed.  Try 1 month of glucosamine daily to see if any improvement if joint aches.  Let us know if worsening.  Return as needed or in 1 year for next physical.   Health Maintenance, Male A healthy lifestyle and preventive care is important for your health and wellness. Ask your health care provider about what schedule of regular examinations is right for you. What should I know about weight and diet?  Eat a Healthy Diet  Eat plenty of vegetables, fruits, whole grains, low-fat dairy products, and lean protein.  Do not eat a lot of foods high in solid fats, added sugars, or salt. Maintain a Healthy Weight  Regular exercise can help you achieve or maintain a healthy weight. You should:  Do at least 150 minutes of exercise each week. The exercise should increase your heart rate and make you sweat (moderate-intensity exercise).  Do strength-training exercises at least twice a week. Watch Your Levels of Cholesterol and Blood Lipids  Have your blood tested for lipids and cholesterol every 5 years starting at 77 years of age. If you are at high risk for heart disease, you should start having your blood tested when you are 77 years old. You may need to have your cholesterol levels checked more often if:  Your lipid or cholesterol levels are high.  You are older than 77 years of age.  You are at high risk for heart disease. What should I know about cancer screening? Many types of cancers can be detected early and may often be prevented. Lung Cancer  You should be screened every year for lung cancer if:  You are a current smoker who has smoked for at least 30 years.  You are a former smoker who has quit within the past 15 years.  Talk to your health care provider about your screening options, when you should start screening, and how often you should be  screened. Colorectal Cancer  Routine colorectal cancer screening usually begins at 77 years of age and should be repeated every 5-10 years until you are 77 years old. You may need to be screened more often if early forms of precancerous polyps or small growths are found. Your health care provider may recommend screening at an earlier age if you have risk factors for colon cancer.  Your health care provider may recommend using home test kits to check for hidden blood in the stool.  A small camera at the end of a tube can be used to examine your colon (sigmoidoscopy or colonoscopy). This checks for the earliest forms of colorectal cancer. Prostate and Testicular Cancer  Depending on your age and overall health, your health care provider may do certain tests to screen for prostate and testicular cancer.  Talk to your health care provider about any symptoms or concerns you have about testicular or prostate cancer. Skin Cancer  Check your skin from head to toe regularly.  Tell your health care provider about any new moles or changes in moles, especially if:  There is a change in a mole's size, shape, or color.  You have a mole that is larger than a pencil eraser.  Always use sunscreen. Apply sunscreen liberally and repeat throughout the day.  Protect yourself by wearing long sleeves, pants, a wide-brimmed hat, and sunglasses when outside. What should I know about heart disease, diabetes, and high  blood pressure?  If you are 3-8 years of age, have your blood pressure checked every 3-5 years. If you are 78 years of age or older, have your blood pressure checked every year. You should have your blood pressure measured twice-once when you are at a hospital or clinic, and once when you are not at a hospital or clinic. Record the average of the two measurements. To check your blood pressure when you are not at a hospital or clinic, you can use:  An automated blood pressure machine at a  pharmacy.  A home blood pressure monitor.  Talk to your health care provider about your target blood pressure.  If you are between 42-63 years old, ask your health care provider if you should take aspirin to prevent heart disease.  Have regular diabetes screenings by checking your fasting blood sugar level.  If you are at a normal weight and have a low risk for diabetes, have this test once every three years after the age of 32.  If you are overweight and have a high risk for diabetes, consider being tested at a younger age or more often.  A one-time screening for abdominal aortic aneurysm (AAA) by ultrasound is recommended for men aged 65-75 years who are current or former smokers. What should I know about preventing infection? Hepatitis B  If you have a higher risk for hepatitis B, you should be screened for this virus. Talk with your health care provider to find out if you are at risk for hepatitis B infection. Hepatitis C  Blood testing is recommended for:  Everyone born from [birth year] through 1966.  Anyone with known risk factors for hepatitis C. Sexually Transmitted Diseases (STDs)  You should be screened each year for STDs including gonorrhea and chlamydia if:  You are sexually active and are younger than 77 years of age.  You are older than 77 years of age and your health care provider tells you that you are at risk for this type of infection.  Your sexual activity has changed since you were last screened and you are at an increased risk for chlamydia or gonorrhea. Ask your health care provider if you are at risk.  Talk with your health care provider about whether you are at high risk of being infected with HIV. Your health care provider may recommend a prescription medicine to help prevent HIV infection. What else can I do?  Schedule regular health, dental, and eye exams.  Stay current with your vaccines (immunizations).  Do not use any tobacco products, such as  cigarettes, chewing tobacco, and e-cigarettes. If you need help quitting, ask your health care provider.  Limit alcohol intake to no more than 2 drinks per day. One drink equals 12 ounces of beer, 5 ounces of wine, or 1 ounces of hard liquor.  Do not use street drugs.  Do not share needles.  Ask your health care provider for help if you need support or information about quitting drugs.  Tell your health care provider if you often feel depressed.  Tell your health care provider if you have ever been abused or do not feel safe at home. This information is not intended to replace advice given to you by your health care provider. Make sure you discuss any questions you have with your health care provider. Document Released: 12/13/2007 Document Revised: 02/13/2016 Document Reviewed: 03/20/2015 Elsevier Interactive Patient Education  2017 [employer].

## 2016-09-22 NOTE — Assessment & Plan Note (Signed)
Chronic, stable. Controlled with PRN spiriva. Finds pollen and cold weather are triggers.

## 2016-09-22 NOTE — Assessment & Plan Note (Signed)
PSA reassuring. 

## 2016-09-22 NOTE — Assessment & Plan Note (Signed)
Mild by CT. Discussed with patient.

## 2016-09-24 ENCOUNTER — Telehealth: Payer: Self-pay

## 2016-09-24 NOTE — Telephone Encounter (Signed)
Pt left v/m; pt ins has changed and spiriva cost to pt is $500.00; pt has checked and ins will approve for small co pay either Advair or Breo. Pt request cb.

## 2016-09-25 MED ORDER — FLUTICASONE FUROATE-VILANTEROL 100-25 MCG/INH IN AEPB: 1.0000 | INHALATION_SPRAY | Freq: Every day | RESPIRATORY_TRACT | 6 refills | Status: DC

## 2016-09-25 NOTE — Telephone Encounter (Signed)
Ok may try breo in place of spiriva - sent to pharmacy. Rinse mouth after use.

## 2016-09-25 NOTE — Telephone Encounter (Signed)
Patient notified

## 2016-10-09 ENCOUNTER — Ambulatory Visit (INDEPENDENT_AMBULATORY_CARE_PROVIDER_SITE_OTHER)
Admission: RE | Admit: 2016-10-09 | Discharge: 2016-10-09 | Disposition: A | Discharge status: Home / Self Care | Payer: Medicare HMO | Source: Ambulatory Visit | Attending: Acute Care | Admitting: Acute Care

## 2016-10-09 DIAGNOSIS — Z87891 Personal history of nicotine dependence: Secondary | ICD-10-CM | POA: Diagnosis not present

## 2016-10-09 DIAGNOSIS — R69 Illness, unspecified: Secondary | ICD-10-CM | POA: Diagnosis not present

## 2016-10-15 ENCOUNTER — Other Ambulatory Visit: Payer: Self-pay | Admitting: Acute Care

## 2016-10-15 DIAGNOSIS — Z87891 Personal history of nicotine dependence: Secondary | ICD-10-CM

## 2016-11-26 DIAGNOSIS — M461 Sacroiliitis, not elsewhere classified: Secondary | ICD-10-CM | POA: Diagnosis not present

## 2016-11-26 DIAGNOSIS — M545 Low back pain: Secondary | ICD-10-CM | POA: Diagnosis not present

## 2016-11-26 DIAGNOSIS — M5137 Other intervertebral disc degeneration, lumbosacral region: Secondary | ICD-10-CM | POA: Diagnosis not present

## 2016-11-26 DIAGNOSIS — M5127 Other intervertebral disc displacement, lumbosacral region: Secondary | ICD-10-CM | POA: Diagnosis not present

## 2016-11-26 DIAGNOSIS — M791 Myalgia: Secondary | ICD-10-CM | POA: Diagnosis not present

## 2016-11-28 DIAGNOSIS — M5127 Other intervertebral disc displacement, lumbosacral region: Secondary | ICD-10-CM | POA: Diagnosis not present

## 2016-11-28 DIAGNOSIS — M5137 Other intervertebral disc degeneration, lumbosacral region: Secondary | ICD-10-CM | POA: Diagnosis not present

## 2016-11-28 DIAGNOSIS — M461 Sacroiliitis, not elsewhere classified: Secondary | ICD-10-CM | POA: Diagnosis not present

## 2016-11-28 DIAGNOSIS — M791 Myalgia: Secondary | ICD-10-CM | POA: Diagnosis not present

## 2016-12-01 DIAGNOSIS — M461 Sacroiliitis, not elsewhere classified: Secondary | ICD-10-CM | POA: Diagnosis not present

## 2016-12-01 DIAGNOSIS — M791 Myalgia: Secondary | ICD-10-CM | POA: Diagnosis not present

## 2016-12-01 DIAGNOSIS — M5137 Other intervertebral disc degeneration, lumbosacral region: Secondary | ICD-10-CM | POA: Diagnosis not present

## 2016-12-01 DIAGNOSIS — M545 Low back pain: Secondary | ICD-10-CM | POA: Diagnosis not present

## 2016-12-01 DIAGNOSIS — M5127 Other intervertebral disc displacement, lumbosacral region: Secondary | ICD-10-CM | POA: Diagnosis not present

## 2016-12-03 DIAGNOSIS — M545 Low back pain: Secondary | ICD-10-CM | POA: Diagnosis not present

## 2016-12-03 DIAGNOSIS — M461 Sacroiliitis, not elsewhere classified: Secondary | ICD-10-CM | POA: Diagnosis not present

## 2016-12-03 DIAGNOSIS — M5127 Other intervertebral disc displacement, lumbosacral region: Secondary | ICD-10-CM | POA: Diagnosis not present

## 2016-12-03 DIAGNOSIS — M791 Myalgia: Secondary | ICD-10-CM | POA: Diagnosis not present

## 2016-12-03 DIAGNOSIS — M5137 Other intervertebral disc degeneration, lumbosacral region: Secondary | ICD-10-CM | POA: Diagnosis not present

## 2016-12-05 DIAGNOSIS — M545 Low back pain: Secondary | ICD-10-CM | POA: Diagnosis not present

## 2016-12-05 DIAGNOSIS — M5137 Other intervertebral disc degeneration, lumbosacral region: Secondary | ICD-10-CM | POA: Diagnosis not present

## 2016-12-05 DIAGNOSIS — M791 Myalgia: Secondary | ICD-10-CM | POA: Diagnosis not present

## 2016-12-05 DIAGNOSIS — M5127 Other intervertebral disc displacement, lumbosacral region: Secondary | ICD-10-CM | POA: Diagnosis not present

## 2016-12-05 DIAGNOSIS — M461 Sacroiliitis, not elsewhere classified: Secondary | ICD-10-CM | POA: Diagnosis not present

## 2016-12-09 DIAGNOSIS — M5137 Other intervertebral disc degeneration, lumbosacral region: Secondary | ICD-10-CM | POA: Diagnosis not present

## 2016-12-09 DIAGNOSIS — M5127 Other intervertebral disc displacement, lumbosacral region: Secondary | ICD-10-CM | POA: Diagnosis not present

## 2016-12-09 DIAGNOSIS — M791 Myalgia: Secondary | ICD-10-CM | POA: Diagnosis not present

## 2016-12-09 DIAGNOSIS — M461 Sacroiliitis, not elsewhere classified: Secondary | ICD-10-CM | POA: Diagnosis not present

## 2016-12-10 DIAGNOSIS — M5127 Other intervertebral disc displacement, lumbosacral region: Secondary | ICD-10-CM | POA: Diagnosis not present

## 2016-12-10 DIAGNOSIS — M461 Sacroiliitis, not elsewhere classified: Secondary | ICD-10-CM | POA: Diagnosis not present

## 2016-12-10 DIAGNOSIS — M791 Myalgia: Secondary | ICD-10-CM | POA: Diagnosis not present

## 2016-12-10 DIAGNOSIS — M5137 Other intervertebral disc degeneration, lumbosacral region: Secondary | ICD-10-CM | POA: Diagnosis not present

## 2016-12-10 DIAGNOSIS — M545 Low back pain: Secondary | ICD-10-CM | POA: Diagnosis not present

## 2016-12-15 DIAGNOSIS — M461 Sacroiliitis, not elsewhere classified: Secondary | ICD-10-CM | POA: Diagnosis not present

## 2016-12-15 DIAGNOSIS — M791 Myalgia: Secondary | ICD-10-CM | POA: Diagnosis not present

## 2016-12-15 DIAGNOSIS — M5127 Other intervertebral disc displacement, lumbosacral region: Secondary | ICD-10-CM | POA: Diagnosis not present

## 2016-12-15 DIAGNOSIS — M5137 Other intervertebral disc degeneration, lumbosacral region: Secondary | ICD-10-CM | POA: Diagnosis not present

## 2016-12-17 DIAGNOSIS — M5127 Other intervertebral disc displacement, lumbosacral region: Secondary | ICD-10-CM | POA: Diagnosis not present

## 2016-12-17 DIAGNOSIS — M791 Myalgia: Secondary | ICD-10-CM | POA: Diagnosis not present

## 2016-12-17 DIAGNOSIS — M5137 Other intervertebral disc degeneration, lumbosacral region: Secondary | ICD-10-CM | POA: Diagnosis not present

## 2016-12-17 DIAGNOSIS — M545 Low back pain: Secondary | ICD-10-CM | POA: Diagnosis not present

## 2016-12-17 DIAGNOSIS — M461 Sacroiliitis, not elsewhere classified: Secondary | ICD-10-CM | POA: Diagnosis not present

## 2016-12-19 DIAGNOSIS — M5127 Other intervertebral disc displacement, lumbosacral region: Secondary | ICD-10-CM | POA: Diagnosis not present

## 2016-12-19 DIAGNOSIS — M545 Low back pain: Secondary | ICD-10-CM | POA: Diagnosis not present

## 2016-12-19 DIAGNOSIS — M461 Sacroiliitis, not elsewhere classified: Secondary | ICD-10-CM | POA: Diagnosis not present

## 2016-12-19 DIAGNOSIS — M791 Myalgia: Secondary | ICD-10-CM | POA: Diagnosis not present

## 2016-12-19 DIAGNOSIS — M5137 Other intervertebral disc degeneration, lumbosacral region: Secondary | ICD-10-CM | POA: Diagnosis not present

## 2016-12-22 DIAGNOSIS — M545 Low back pain: Secondary | ICD-10-CM | POA: Diagnosis not present

## 2016-12-22 DIAGNOSIS — M5137 Other intervertebral disc degeneration, lumbosacral region: Secondary | ICD-10-CM | POA: Diagnosis not present

## 2016-12-22 DIAGNOSIS — M5127 Other intervertebral disc displacement, lumbosacral region: Secondary | ICD-10-CM | POA: Diagnosis not present

## 2016-12-22 DIAGNOSIS — M461 Sacroiliitis, not elsewhere classified: Secondary | ICD-10-CM | POA: Diagnosis not present

## 2016-12-22 DIAGNOSIS — M791 Myalgia: Secondary | ICD-10-CM | POA: Diagnosis not present

## 2016-12-24 DIAGNOSIS — M5127 Other intervertebral disc displacement, lumbosacral region: Secondary | ICD-10-CM | POA: Diagnosis not present

## 2016-12-24 DIAGNOSIS — M791 Myalgia: Secondary | ICD-10-CM | POA: Diagnosis not present

## 2016-12-24 DIAGNOSIS — M461 Sacroiliitis, not elsewhere classified: Secondary | ICD-10-CM | POA: Diagnosis not present

## 2016-12-24 DIAGNOSIS — M5137 Other intervertebral disc degeneration, lumbosacral region: Secondary | ICD-10-CM | POA: Diagnosis not present

## 2016-12-26 DIAGNOSIS — M461 Sacroiliitis, not elsewhere classified: Secondary | ICD-10-CM | POA: Diagnosis not present

## 2016-12-26 DIAGNOSIS — M791 Myalgia: Secondary | ICD-10-CM | POA: Diagnosis not present

## 2016-12-26 DIAGNOSIS — M50221 Other cervical disc displacement at C4-C5 level: Secondary | ICD-10-CM | POA: Diagnosis not present

## 2016-12-26 DIAGNOSIS — M50321 Other cervical disc degeneration at C4-C5 level: Secondary | ICD-10-CM | POA: Diagnosis not present

## 2016-12-26 DIAGNOSIS — M542 Cervicalgia: Secondary | ICD-10-CM | POA: Diagnosis not present

## 2016-12-29 DIAGNOSIS — M542 Cervicalgia: Secondary | ICD-10-CM | POA: Diagnosis not present

## 2016-12-29 DIAGNOSIS — M461 Sacroiliitis, not elsewhere classified: Secondary | ICD-10-CM | POA: Diagnosis not present

## 2016-12-29 DIAGNOSIS — M50221 Other cervical disc displacement at C4-C5 level: Secondary | ICD-10-CM | POA: Diagnosis not present

## 2016-12-29 DIAGNOSIS — M791 Myalgia: Secondary | ICD-10-CM | POA: Diagnosis not present

## 2016-12-29 DIAGNOSIS — M50321 Other cervical disc degeneration at C4-C5 level: Secondary | ICD-10-CM | POA: Diagnosis not present

## 2017-01-01 DIAGNOSIS — M461 Sacroiliitis, not elsewhere classified: Secondary | ICD-10-CM | POA: Diagnosis not present

## 2017-01-01 DIAGNOSIS — M791 Myalgia: Secondary | ICD-10-CM | POA: Diagnosis not present

## 2017-01-01 DIAGNOSIS — M50221 Other cervical disc displacement at C4-C5 level: Secondary | ICD-10-CM | POA: Diagnosis not present

## 2017-01-01 DIAGNOSIS — M50321 Other cervical disc degeneration at C4-C5 level: Secondary | ICD-10-CM | POA: Diagnosis not present

## 2017-01-02 DIAGNOSIS — M50221 Other cervical disc displacement at C4-C5 level: Secondary | ICD-10-CM | POA: Diagnosis not present

## 2017-01-02 DIAGNOSIS — M50321 Other cervical disc degeneration at C4-C5 level: Secondary | ICD-10-CM | POA: Diagnosis not present

## 2017-01-02 DIAGNOSIS — M461 Sacroiliitis, not elsewhere classified: Secondary | ICD-10-CM | POA: Diagnosis not present

## 2017-01-02 DIAGNOSIS — M791 Myalgia: Secondary | ICD-10-CM | POA: Diagnosis not present

## 2017-01-02 DIAGNOSIS — M542 Cervicalgia: Secondary | ICD-10-CM | POA: Diagnosis not present

## 2017-01-05 DIAGNOSIS — M461 Sacroiliitis, not elsewhere classified: Secondary | ICD-10-CM | POA: Diagnosis not present

## 2017-01-05 DIAGNOSIS — M50221 Other cervical disc displacement at C4-C5 level: Secondary | ICD-10-CM | POA: Diagnosis not present

## 2017-01-05 DIAGNOSIS — M542 Cervicalgia: Secondary | ICD-10-CM | POA: Diagnosis not present

## 2017-01-05 DIAGNOSIS — M50321 Other cervical disc degeneration at C4-C5 level: Secondary | ICD-10-CM | POA: Diagnosis not present

## 2017-01-05 DIAGNOSIS — M791 Myalgia: Secondary | ICD-10-CM | POA: Diagnosis not present

## 2017-01-07 DIAGNOSIS — M50221 Other cervical disc displacement at C4-C5 level: Secondary | ICD-10-CM | POA: Diagnosis not present

## 2017-01-07 DIAGNOSIS — M791 Myalgia: Secondary | ICD-10-CM | POA: Diagnosis not present

## 2017-01-07 DIAGNOSIS — M461 Sacroiliitis, not elsewhere classified: Secondary | ICD-10-CM | POA: Diagnosis not present

## 2017-01-07 DIAGNOSIS — M50321 Other cervical disc degeneration at C4-C5 level: Secondary | ICD-10-CM | POA: Diagnosis not present

## 2017-01-09 DIAGNOSIS — M791 Myalgia: Secondary | ICD-10-CM | POA: Diagnosis not present

## 2017-01-09 DIAGNOSIS — M50321 Other cervical disc degeneration at C4-C5 level: Secondary | ICD-10-CM | POA: Diagnosis not present

## 2017-01-09 DIAGNOSIS — M542 Cervicalgia: Secondary | ICD-10-CM | POA: Diagnosis not present

## 2017-01-09 DIAGNOSIS — M461 Sacroiliitis, not elsewhere classified: Secondary | ICD-10-CM | POA: Diagnosis not present

## 2017-01-09 DIAGNOSIS — M50221 Other cervical disc displacement at C4-C5 level: Secondary | ICD-10-CM | POA: Diagnosis not present

## 2017-01-12 DIAGNOSIS — M461 Sacroiliitis, not elsewhere classified: Secondary | ICD-10-CM | POA: Diagnosis not present

## 2017-01-12 DIAGNOSIS — M542 Cervicalgia: Secondary | ICD-10-CM | POA: Diagnosis not present

## 2017-01-12 DIAGNOSIS — M50221 Other cervical disc displacement at C4-C5 level: Secondary | ICD-10-CM | POA: Diagnosis not present

## 2017-01-12 DIAGNOSIS — M50321 Other cervical disc degeneration at C4-C5 level: Secondary | ICD-10-CM | POA: Diagnosis not present

## 2017-01-12 DIAGNOSIS — M791 Myalgia: Secondary | ICD-10-CM | POA: Diagnosis not present

## 2017-01-14 DIAGNOSIS — M791 Myalgia: Secondary | ICD-10-CM | POA: Diagnosis not present

## 2017-01-14 DIAGNOSIS — M542 Cervicalgia: Secondary | ICD-10-CM | POA: Diagnosis not present

## 2017-01-14 DIAGNOSIS — M461 Sacroiliitis, not elsewhere classified: Secondary | ICD-10-CM | POA: Diagnosis not present

## 2017-01-14 DIAGNOSIS — M50321 Other cervical disc degeneration at C4-C5 level: Secondary | ICD-10-CM | POA: Diagnosis not present

## 2017-01-14 DIAGNOSIS — M50221 Other cervical disc displacement at C4-C5 level: Secondary | ICD-10-CM | POA: Diagnosis not present

## 2017-01-16 DIAGNOSIS — M50221 Other cervical disc displacement at C4-C5 level: Secondary | ICD-10-CM | POA: Diagnosis not present

## 2017-01-16 DIAGNOSIS — M461 Sacroiliitis, not elsewhere classified: Secondary | ICD-10-CM | POA: Diagnosis not present

## 2017-01-16 DIAGNOSIS — M50321 Other cervical disc degeneration at C4-C5 level: Secondary | ICD-10-CM | POA: Diagnosis not present

## 2017-01-16 DIAGNOSIS — M542 Cervicalgia: Secondary | ICD-10-CM | POA: Diagnosis not present

## 2017-01-16 DIAGNOSIS — M791 Myalgia: Secondary | ICD-10-CM | POA: Diagnosis not present

## 2017-01-19 DIAGNOSIS — M791 Myalgia: Secondary | ICD-10-CM | POA: Diagnosis not present

## 2017-01-19 DIAGNOSIS — M50321 Other cervical disc degeneration at C4-C5 level: Secondary | ICD-10-CM | POA: Diagnosis not present

## 2017-01-19 DIAGNOSIS — M50221 Other cervical disc displacement at C4-C5 level: Secondary | ICD-10-CM | POA: Diagnosis not present

## 2017-01-19 DIAGNOSIS — M461 Sacroiliitis, not elsewhere classified: Secondary | ICD-10-CM | POA: Diagnosis not present

## 2017-01-21 DIAGNOSIS — M791 Myalgia: Secondary | ICD-10-CM | POA: Diagnosis not present

## 2017-01-21 DIAGNOSIS — M461 Sacroiliitis, not elsewhere classified: Secondary | ICD-10-CM | POA: Diagnosis not present

## 2017-01-21 DIAGNOSIS — M50321 Other cervical disc degeneration at C4-C5 level: Secondary | ICD-10-CM | POA: Diagnosis not present

## 2017-01-21 DIAGNOSIS — M50221 Other cervical disc displacement at C4-C5 level: Secondary | ICD-10-CM | POA: Diagnosis not present

## 2017-02-20 DIAGNOSIS — M1712 Unilateral primary osteoarthritis, left knee: Secondary | ICD-10-CM | POA: Diagnosis not present

## 2017-02-20 DIAGNOSIS — M25562 Pain in left knee: Secondary | ICD-10-CM | POA: Diagnosis not present

## 2017-03-03 DIAGNOSIS — M1712 Unilateral primary osteoarthritis, left knee: Secondary | ICD-10-CM | POA: Diagnosis not present

## 2017-04-14 DIAGNOSIS — R69 Illness, unspecified: Secondary | ICD-10-CM | POA: Diagnosis not present

## 2017-05-07 ENCOUNTER — Encounter
Admission: RE | Admit: 2017-05-07 | Discharge: 2017-05-07 | Disposition: A | Discharge status: Home / Self Care | Payer: Medicare HMO | Source: Ambulatory Visit | Attending: Orthopedic Surgery | Admitting: Orthopedic Surgery

## 2017-05-07 ENCOUNTER — Other Ambulatory Visit: Payer: Self-pay

## 2017-05-07 DIAGNOSIS — I2510 Atherosclerotic heart disease of native coronary artery without angina pectoris: Secondary | ICD-10-CM | POA: Insufficient documentation

## 2017-05-07 DIAGNOSIS — Z01818 Encounter for other preprocedural examination: Secondary | ICD-10-CM | POA: Diagnosis not present

## 2017-05-07 DIAGNOSIS — I739 Peripheral vascular disease, unspecified: Secondary | ICD-10-CM | POA: Diagnosis not present

## 2017-05-07 LAB — COMPREHENSIVE METABOLIC PANEL
ALK PHOS: 62 U/L (ref 38–126)
ALT: 14 U/L — AB (ref 17–63)
AST: 22 U/L (ref 15–41)
Albumin: 4.3 g/dL (ref 3.5–5.0)
Anion gap: 9 (ref 5–15)
BUN: 18 mg/dL (ref 6–20)
CALCIUM: 9.3 mg/dL (ref 8.9–10.3)
CO2: 23 mmol/L (ref 22–32)
CREATININE: 0.96 mg/dL (ref 0.61–1.24)
Chloride: 105 mmol/L (ref 101–111)
GFR calc non Af Amer: >60 mL/min (ref >60)
Glucose, Bld: 93 mg/dL (ref 65–99)
Potassium: 4.1 mmol/L (ref 3.5–5.1)
Sodium: 137 mmol/L (ref 135–145)
Total Bilirubin: 0.9 mg/dL (ref 0.3–1.2)
Total Protein: 7.1 g/dL (ref 6.5–8.1)

## 2017-05-07 LAB — APTT: aPTT: 30 seconds (ref 24–36)

## 2017-05-07 LAB — URINALYSIS, ROUTINE W REFLEX MICROSCOPIC
Bilirubin Urine: NEGATIVE
GLUCOSE, UA: NEGATIVE mg/dL
Hgb urine dipstick: NEGATIVE
KETONES UR: NEGATIVE mg/dL
LEUKOCYTES UA: NEGATIVE
NITRITE: NEGATIVE
PROTEIN: NEGATIVE mg/dL
Specific Gravity, Urine: 1.006 (ref 1.005–1.030)
pH: 7 (ref 5.0–8.0)

## 2017-05-07 LAB — CBC
HCT: 44.6 % (ref 40.0–52.0)
HEMOGLOBIN: 14.9 g/dL (ref 13.0–18.0)
MCH: 33.4 pg (ref 26.0–34.0)
MCHC: 33.5 g/dL (ref 32.0–36.0)
MCV: 99.7 fL (ref 80.0–100.0)
PLATELETS: 165 10*3/uL (ref 150–440)
RBC: 4.48 MIL/uL (ref 4.40–5.90)
RDW: 13.8 % (ref 11.5–14.5)
WBC: 4.5 10*3/uL (ref 3.8–10.6)

## 2017-05-07 LAB — SURGICAL PCR SCREEN
MRSA, PCR: NEGATIVE
Staphylococcus aureus: NEGATIVE

## 2017-05-07 LAB — TYPE AND SCREEN
ABO/RH(D): O POS
Antibody Screen: NEGATIVE

## 2017-05-07 LAB — PROTIME-INR
INR: 0.97
Prothrombin Time: 12.8 seconds (ref 11.4–15.2)

## 2017-05-07 LAB — SEDIMENTATION RATE: Sed Rate: 2 mm/hr (ref 0–20)

## 2017-05-07 LAB — C-REACTIVE PROTEIN: CRP: <0.8 mg/dL (ref <1.0)

## 2017-05-07 NOTE — Patient Instructions (Signed)
Your procedure is scheduled on: Wednesday 05/20/17 Report to [place]. 2ND FLOOR MEDICAL MALL ENTRANCE. To find out your arrival time please call [phone] between 1PM - 3PM on Tuesday 05/19/17.  Remember: Instructions that are not followed completely may result in serious medical risk, up to and including death, or upon the discretion of your surgeon and anesthesiologist your surgery may need to be rescheduled.    __X__ 1. Do not eat anything after midnight the night before your    procedure.  No gum chewing or hard candies.  You may drink clear   liquids up to 2 hours before you are scheduled to arrive at the   hospital for your procedure. Do not drink clear liquids within 2   hours of scheduled arrival to the hospital as this may lead to your   procedure being delayed or rescheduled.       Clear liquids include:   Water or Apple juice without pulp   Clear carbohydrate beverage such as Clearfast or Gatorade   Black coffee or Clear Tea (no milk, no creamer, do not add anything   to the coffee or tea)    Diabetics should only drink water   __X__ 2. No Alcohol for 24 hours before or after surgery.   ____ 3. Bring all medications with you on the day of surgery if instructed.    __X__ 4. Notify your doctor if there is any change in your medical condition     (cold, fever, infections).             __X___5. No smoking within 24 hours of your surgery.     Do not wear jewelry, make-up, hairpins, clips or nail polish.  Do not wear lotions, powders, or perfumes.   Do not shave 48 hours prior to surgery. Men may shave face and neck.  Do not bring valuables to the hospital.    [facility] is not responsible for any belongings or valuables.               Contacts, dentures or bridgework may not be worn into surgery.  Leave your suitcase in the car. After surgery it may be brought to your room.  For patients admitted to the hospital, discharge time is determined by your                 treatment team.   Patients discharged the day of surgery will not be allowed to drive home.   Please read over the following fact sheets that you were given:   MRSA Information   ____ Take these medicines the morning of surgery with A SIP OF WATER:    1. NONE  2.   3.   4.  5.  6.  ____ Fleet Enema (as directed)   __X__ Use CHG Soap/SAGE wipes as directed  __X__ Use inhalers on the day of surgery  ____ Stop metformin 2 days prior to surgery    ____ Take 1/2 of usual insulin dose the night before surgery and none on the morning of surgery.   ____ Stop Coumadin/Plavix/aspirin on   __X__ Stop Anti-inflammatories such as Advil, Aleve, Ibuprofen, Motrin, Naproxen, Naprosyn, Goodies,powder, or aspirin products.  OK to take Tylenol.   __X__ Stop supplements, Vitamin E, Fish Oil until after surgery.    ____ Bring C-Pap to the hospital.

## 2017-05-08 LAB — URINE CULTURE
CULTURE: NO GROWTH
SPECIAL REQUESTS: NORMAL

## 2017-05-19 MED ORDER — TRANEXAMIC ACID 1000 MG/10ML IV SOLN
1000.0000 mg | INTRAVENOUS | Status: AC
  Administered 2017-05-20: 1000 mg via INTRAVENOUS
  Filled 2017-05-19: qty 10

## 2017-05-19 MED ORDER — CEFAZOLIN SODIUM-DEXTROSE 2-4 GM/100ML-% IV SOLN
2.0000 g | INTRAVENOUS | Status: AC
  Administered 2017-05-20: 2 g via INTRAVENOUS

## 2017-05-20 ENCOUNTER — Inpatient Hospital Stay: Payer: Medicare HMO | Admitting: Certified Registered Nurse Anesthetist

## 2017-05-20 ENCOUNTER — Inpatient Hospital Stay
Admission: RE | Admit: 2017-05-20 | Discharge: 2017-05-22 | DRG: 470 | Disposition: A | Discharge status: Home Health | Payer: Medicare HMO | Attending: Orthopedic Surgery | Admitting: Orthopedic Surgery

## 2017-05-20 ENCOUNTER — Encounter: Payer: Self-pay | Admitting: Orthopedic Surgery

## 2017-05-20 ENCOUNTER — Encounter
Admission: RE | Disposition: A | Discharge status: Home Health | Payer: Self-pay | Source: Home / Self Care | Attending: Orthopedic Surgery

## 2017-05-20 ENCOUNTER — Inpatient Hospital Stay: Payer: Medicare HMO

## 2017-05-20 DIAGNOSIS — M25562 Pain in left knee: Secondary | ICD-10-CM | POA: Diagnosis not present

## 2017-05-20 DIAGNOSIS — Z882 Allergy status to sulfonamides status: Secondary | ICD-10-CM

## 2017-05-20 DIAGNOSIS — Z6826 Body mass index (BMI) 26.0-26.9, adult: Secondary | ICD-10-CM

## 2017-05-20 DIAGNOSIS — Z96651 Presence of right artificial knee joint: Secondary | ICD-10-CM | POA: Diagnosis present

## 2017-05-20 DIAGNOSIS — Z8546 Personal history of malignant neoplasm of prostate: Secondary | ICD-10-CM | POA: Diagnosis not present

## 2017-05-20 DIAGNOSIS — I2510 Atherosclerotic heart disease of native coronary artery without angina pectoris: Secondary | ICD-10-CM | POA: Diagnosis not present

## 2017-05-20 DIAGNOSIS — E669 Obesity, unspecified: Secondary | ICD-10-CM | POA: Diagnosis present

## 2017-05-20 DIAGNOSIS — M1712 Unilateral primary osteoarthritis, left knee: Secondary | ICD-10-CM | POA: Diagnosis not present

## 2017-05-20 DIAGNOSIS — J449 Chronic obstructive pulmonary disease, unspecified: Secondary | ICD-10-CM | POA: Diagnosis present

## 2017-05-20 DIAGNOSIS — Z7951 Long term (current) use of inhaled steroids: Secondary | ICD-10-CM

## 2017-05-20 DIAGNOSIS — K589 Irritable bowel syndrome without diarrhea: Secondary | ICD-10-CM | POA: Diagnosis present

## 2017-05-20 DIAGNOSIS — Z86010 Personal history of colonic polyps: Secondary | ICD-10-CM

## 2017-05-20 DIAGNOSIS — M179 Osteoarthritis of knee, unspecified: Secondary | ICD-10-CM | POA: Diagnosis not present

## 2017-05-20 DIAGNOSIS — Z96659 Presence of unspecified artificial knee joint: Secondary | ICD-10-CM

## 2017-05-20 HISTORY — PX: KNEE ARTHROPLASTY: SHX992

## 2017-05-20 LAB — ABO/RH: ABO/RH(D): O POS

## 2017-05-20 SURGERY — ARTHROPLASTY, KNEE, TOTAL, USING IMAGELESS COMPUTER-ASSISTED NAVIGATION
Anesthesia: Spinal | Site: Knee | Laterality: Left | Wound class: Clean

## 2017-05-20 MED ORDER — OXYCODONE HCL 5 MG PO TABS: 10.0000 mg | ORAL_TABLET | ORAL | Status: DC | PRN

## 2017-05-20 MED ORDER — DEXTROSE 5 % IV SOLN
2.0000 g | Freq: Four times a day (QID) | INTRAVENOUS | Status: AC
  Administered 2017-05-20 – 2017-05-21 (×4): 2 g via INTRAVENOUS
  Filled 2017-05-20 (×4): qty 20

## 2017-05-20 MED ORDER — NEOMYCIN-POLYMYXIN B GU 40-200000 IR SOLN
Status: DC | PRN
  Administered 2017-05-20: 14 mL

## 2017-05-20 MED ORDER — BUPIVACAINE HCL (PF) 0.5 % IJ SOLN
INTRAMUSCULAR | Status: DC | PRN
  Administered 2017-05-20: 2 mL via INTRATHECAL

## 2017-05-20 MED ORDER — ACETAMINOPHEN 10 MG/ML IV SOLN
INTRAVENOUS | Status: DC | PRN
  Administered 2017-05-20: 1000 mg via INTRAVENOUS

## 2017-05-20 MED ORDER — ONDANSETRON HCL 4 MG PO TABS: 4.0000 mg | ORAL_TABLET | Freq: Four times a day (QID) | ORAL | Status: DC | PRN

## 2017-05-20 MED ORDER — TRAMADOL HCL 50 MG PO TABS
50.0000 mg | ORAL_TABLET | ORAL | Status: DC | PRN
  Administered 2017-05-20 – 2017-05-22 (×5): 100 mg via ORAL
  Filled 2017-05-20 (×5): qty 2

## 2017-05-20 MED ORDER — FENTANYL CITRATE (PF) 100 MCG/2ML IJ SOLN
INTRAMUSCULAR | Status: DC | PRN
  Administered 2017-05-20: 50 ug via INTRAVENOUS
  Administered 2017-05-20 (×2): 25 ug via INTRAVENOUS

## 2017-05-20 MED ORDER — BUPIVACAINE HCL (PF) 0.25 % IJ SOLN
INTRAMUSCULAR | Status: DC | PRN
  Administered 2017-05-20: 60 mL

## 2017-05-20 MED ORDER — PROPOFOL 10 MG/ML IV BOLUS
INTRAVENOUS | Status: AC
Start: 2017-05-20 — End: 2017-05-20
  Filled 2017-05-20: qty 20

## 2017-05-20 MED ORDER — BUPIVACAINE LIPOSOME 1.3 % IJ SUSP
INTRAMUSCULAR | Status: DC | PRN
  Administered 2017-05-20: 60 mL

## 2017-05-20 MED ORDER — FLUTICASONE FUROATE-VILANTEROL 100-25 MCG/INH IN AEPB: 1.0000 | INHALATION_SPRAY | Freq: Every day | RESPIRATORY_TRACT | Status: DC

## 2017-05-20 MED ORDER — ACETAMINOPHEN 325 MG PO TABS
650.0000 mg | ORAL_TABLET | ORAL | Status: DC | PRN
  Administered 2017-05-21 – 2017-05-22 (×2): 650 mg via ORAL
  Filled 2017-05-20 (×2): qty 2

## 2017-05-20 MED ORDER — ENOXAPARIN SODIUM 30 MG/0.3ML ~~LOC~~ SOLN
30.0000 mg | Freq: Two times a day (BID) | SUBCUTANEOUS | Status: DC
  Administered 2017-05-21 – 2017-05-22 (×3): 30 mg via SUBCUTANEOUS
  Filled 2017-05-20 (×3): qty 0.3

## 2017-05-20 MED ORDER — BUPIVACAINE HCL (PF) 0.5 % IJ SOLN
INTRAMUSCULAR | Status: AC
  Filled 2017-05-20: qty 10

## 2017-05-20 MED ORDER — FLEET ENEMA 7-19 GM/118ML RE ENEM: 1.0000 | ENEMA | Freq: Once | RECTAL | Status: DC | PRN

## 2017-05-20 MED ORDER — MAGNESIUM HYDROXIDE 400 MG/5ML PO SUSP
30.0000 mL | Freq: Every day | ORAL | Status: DC | PRN
  Administered 2017-05-21: 30 mL via ORAL
  Filled 2017-05-20: qty 30

## 2017-05-20 MED ORDER — ONDANSETRON HCL 4 MG/2ML IJ SOLN: 4.0000 mg | Freq: Four times a day (QID) | INTRAMUSCULAR | Status: DC | PRN

## 2017-05-20 MED ORDER — PHENOL 1.4 % MT LIQD
1.0000 | OROMUCOSAL | Status: DC | PRN
  Filled 2017-05-20: qty 177

## 2017-05-20 MED ORDER — BISACODYL 10 MG RE SUPP: 10.0000 mg | Freq: Every day | RECTAL | Status: DC | PRN

## 2017-05-20 MED ORDER — FENTANYL CITRATE (PF) 100 MCG/2ML IJ SOLN
INTRAMUSCULAR | Status: AC
  Filled 2017-05-20: qty 2

## 2017-05-20 MED ORDER — BUPIVACAINE LIPOSOME 1.3 % IJ SUSP
INTRAMUSCULAR | Status: AC
  Filled 2017-05-20: qty 20

## 2017-05-20 MED ORDER — PROPOFOL 500 MG/50ML IV EMUL
INTRAVENOUS | Status: AC
  Filled 2017-05-20: qty 50

## 2017-05-20 MED ORDER — MIDAZOLAM HCL 2 MG/2ML IJ SOLN
INTRAMUSCULAR | Status: DC | PRN
  Administered 2017-05-20 (×2): 1 mg via INTRAVENOUS

## 2017-05-20 MED ORDER — PROPOFOL 500 MG/50ML IV EMUL
INTRAVENOUS | Status: DC | PRN
  Administered 2017-05-20: 75 ug/kg/min via INTRAVENOUS

## 2017-05-20 MED ORDER — MENTHOL 3 MG MT LOZG
1.0000 | LOZENGE | OROMUCOSAL | Status: DC | PRN
  Filled 2017-05-20: qty 9

## 2017-05-20 MED ORDER — CELECOXIB 200 MG PO CAPS
200.0000 mg | ORAL_CAPSULE | Freq: Two times a day (BID) | ORAL | Status: DC
  Administered 2017-05-21 – 2017-05-22 (×3): 200 mg via ORAL
  Filled 2017-05-20 (×4): qty 1

## 2017-05-20 MED ORDER — PROPOFOL 10 MG/ML IV BOLUS
INTRAVENOUS | Status: AC
  Filled 2017-05-20: qty 20

## 2017-05-20 MED ORDER — MIDAZOLAM HCL 2 MG/2ML IJ SOLN
INTRAMUSCULAR | Status: AC
  Filled 2017-05-20: qty 2

## 2017-05-20 MED ORDER — ACETAMINOPHEN 650 MG RE SUPP: 650.0000 mg | RECTAL | Status: DC | PRN

## 2017-05-20 MED ORDER — CEFAZOLIN SODIUM-DEXTROSE 2-4 GM/100ML-% IV SOLN
INTRAVENOUS | Status: AC
  Filled 2017-05-20: qty 100

## 2017-05-20 MED ORDER — PANTOPRAZOLE SODIUM 40 MG PO TBEC
40.0000 mg | DELAYED_RELEASE_TABLET | Freq: Two times a day (BID) | ORAL | Status: DC
  Administered 2017-05-20 – 2017-05-22 (×4): 40 mg via ORAL
  Filled 2017-05-20 (×5): qty 1

## 2017-05-20 MED ORDER — ACETAMINOPHEN 10 MG/ML IV SOLN
1000.0000 mg | Freq: Four times a day (QID) | INTRAVENOUS | Status: AC
  Administered 2017-05-20 – 2017-05-21 (×4): 1000 mg via INTRAVENOUS
  Filled 2017-05-20 (×4): qty 100

## 2017-05-20 MED ORDER — ACETAMINOPHEN 10 MG/ML IV SOLN
INTRAVENOUS | Status: AC
  Filled 2017-05-20: qty 100

## 2017-05-20 MED ORDER — ALUM & MAG HYDROXIDE-SIMETH 200-200-20 MG/5ML PO SUSP: 30.0000 mL | ORAL | Status: DC | PRN

## 2017-05-20 MED ORDER — FAMOTIDINE 20 MG PO TABS
20.0000 mg | ORAL_TABLET | Freq: Once | ORAL | Status: AC
  Administered 2017-05-20: 20 mg via ORAL

## 2017-05-20 MED ORDER — OXYCODONE HCL 5 MG PO TABS
5.0000 mg | ORAL_TABLET | ORAL | Status: DC | PRN
  Administered 2017-05-20: 5 mg via ORAL
  Filled 2017-05-20: qty 1

## 2017-05-20 MED ORDER — FAMOTIDINE 20 MG PO TABS
ORAL_TABLET | ORAL | Status: AC
Start: 2017-05-20 — End: 2017-05-20
  Administered 2017-05-20: 20 mg via ORAL
  Filled 2017-05-20: qty 1

## 2017-05-20 MED ORDER — BUPIVACAINE HCL (PF) 0.25 % IJ SOLN
INTRAMUSCULAR | Status: AC
  Filled 2017-05-20: qty 60

## 2017-05-20 MED ORDER — LACTATED RINGERS IV SOLN
INTRAVENOUS | Status: DC
  Administered 2017-05-20 (×2): via INTRAVENOUS

## 2017-05-20 MED ORDER — MORPHINE SULFATE (PF) 2 MG/ML IV SOLN
2.0000 mg | INTRAVENOUS | Status: DC | PRN
  Administered 2017-05-20: 2 mg via INTRAVENOUS
  Filled 2017-05-20: qty 1

## 2017-05-20 MED ORDER — SODIUM CHLORIDE 0.9 % IV SOLN
1000.0000 mg | Freq: Once | INTRAVENOUS | Status: AC
  Administered 2017-05-20: 1000 mg via INTRAVENOUS
  Filled 2017-05-20: qty 10

## 2017-05-20 MED ORDER — OXYCODONE HCL 5 MG PO TABS
5.0000 mg | ORAL_TABLET | Freq: Once | ORAL | Status: DC | PRN
Start: 2017-05-20 — End: 2017-05-20

## 2017-05-20 MED ORDER — DIPHENHYDRAMINE HCL 12.5 MG/5ML PO ELIX: 12.5000 mg | ORAL_SOLUTION | ORAL | Status: DC | PRN

## 2017-05-20 MED ORDER — TETRACAINE HCL 1 % IJ SOLN
INTRAMUSCULAR | Status: DC | PRN
  Administered 2017-05-20: 10 mg via INTRASPINAL

## 2017-05-20 MED ORDER — MEPERIDINE HCL 50 MG/ML IJ SOLN: 6.2500 mg | INTRAMUSCULAR | Status: DC | PRN

## 2017-05-20 MED ORDER — FENTANYL CITRATE (PF) 100 MCG/2ML IJ SOLN: 25.0000 ug | INTRAMUSCULAR | Status: DC | PRN

## 2017-05-20 MED ORDER — SODIUM CHLORIDE 0.9 % IV SOLN
INTRAVENOUS | Status: DC
  Administered 2017-05-20 – 2017-05-21 (×2): via INTRAVENOUS

## 2017-05-20 MED ORDER — SENNOSIDES-DOCUSATE SODIUM 8.6-50 MG PO TABS
1.0000 | ORAL_TABLET | Freq: Two times a day (BID) | ORAL | Status: DC
  Administered 2017-05-20 – 2017-05-22 (×4): 1 via ORAL
  Filled 2017-05-20 (×4): qty 1

## 2017-05-20 MED ORDER — GLYCOPYRROLATE 0.2 MG/ML IJ SOLN
INTRAMUSCULAR | Status: DC | PRN
  Administered 2017-05-20: 0.2 mg via INTRAVENOUS

## 2017-05-20 MED ORDER — FERROUS SULFATE 325 (65 FE) MG PO TABS
325.0000 mg | ORAL_TABLET | Freq: Two times a day (BID) | ORAL | Status: DC
  Administered 2017-05-20 – 2017-05-22 (×4): 325 mg via ORAL
  Filled 2017-05-20 (×4): qty 1

## 2017-05-20 MED ORDER — METOCLOPRAMIDE HCL 10 MG PO TABS
10.0000 mg | ORAL_TABLET | Freq: Three times a day (TID) | ORAL | Status: AC
  Administered 2017-05-20 – 2017-05-22 (×8): 10 mg via ORAL
  Filled 2017-05-20 (×7): qty 1

## 2017-05-20 MED ORDER — NEOMYCIN-POLYMYXIN B GU 40-200000 IR SOLN
Status: AC
  Filled 2017-05-20: qty 20

## 2017-05-20 MED ORDER — PROPOFOL 10 MG/ML IV BOLUS
INTRAVENOUS | Status: DC | PRN
  Administered 2017-05-20: 10 mg via INTRAVENOUS
  Administered 2017-05-20 (×3): 20 mg via INTRAVENOUS

## 2017-05-20 MED ORDER — OXYCODONE HCL 5 MG/5ML PO SOLN: 5.0000 mg | Freq: Once | ORAL | Status: DC | PRN

## 2017-05-20 MED ORDER — SODIUM CHLORIDE 0.9 % IV SOLN
INTRAVENOUS | Status: DC | PRN
  Administered 2017-05-20: 25 ug/min via INTRAVENOUS

## 2017-05-20 MED ORDER — PROMETHAZINE HCL 25 MG/ML IJ SOLN: 6.2500 mg | INTRAMUSCULAR | Status: DC | PRN

## 2017-05-20 MED ORDER — SODIUM CHLORIDE 0.9 % IJ SOLN
INTRAMUSCULAR | Status: AC
  Filled 2017-05-20: qty 50

## 2017-05-20 MED ORDER — CHLORHEXIDINE GLUCONATE 4 % EX LIQD: 60.0000 mL | Freq: Once | CUTANEOUS | Status: DC

## 2017-05-20 SURGICAL SUPPLY — 71 items
BATTERY INSTRU NAVIGATION (MISCELLANEOUS) ×8 IMPLANT
BLADE SAW 1 (BLADE) ×2 IMPLANT
BLADE SAW 1/2 (BLADE) ×2 IMPLANT
BLADE SAW 70X12.5 (BLADE) IMPLANT
BTRY SRG DRVR LF (MISCELLANEOUS) ×4
CANISTER SUCT 1200ML W/VALVE (MISCELLANEOUS) ×2 IMPLANT
CANISTER SUCT 3000ML PPV (MISCELLANEOUS) ×4 IMPLANT
CAP KNEE TOTAL 3 SIGMA ×1 IMPLANT
CEMENT HV SMART SET (Cement) ×4 IMPLANT
COOLER POLAR GLACIER W/PUMP (MISCELLANEOUS) ×2 IMPLANT
CUFF TOURN 24 STER (MISCELLANEOUS) IMPLANT
CUFF TOURN 30 STER DUAL PORT (MISCELLANEOUS) ×1 IMPLANT
DRAPE SHEET LG 3/4 BI-LAMINATE (DRAPES) ×2 IMPLANT
DRSG DERMACEA 8X12 NADH (GAUZE/BANDAGES/DRESSINGS) ×2 IMPLANT
DRSG OPSITE POSTOP 4X14 (GAUZE/BANDAGES/DRESSINGS) ×2 IMPLANT
DRSG TEGADERM 4X4.75 (GAUZE/BANDAGES/DRESSINGS) ×2 IMPLANT
DURAPREP 26ML APPLICATOR (WOUND CARE) ×4 IMPLANT
ELECT CAUTERY BLADE 6.4 (BLADE) ×2 IMPLANT
ELECT REM PT RETURN 9FT ADLT (ELECTROSURGICAL) ×2
ELECTRODE REM PT RTRN 9FT ADLT (ELECTROSURGICAL) ×1 IMPLANT
EVACUATOR 1/8 PVC DRAIN (DRAIN) ×2 IMPLANT
EX-PIN ORTHOLOCK NAV 4X150 (PIN) ×4 IMPLANT
GLOVE BIO SURGEON STRL SZ7 (GLOVE) ×2 IMPLANT
GLOVE BIOGEL M STRL SZ7.5 (GLOVE) ×4 IMPLANT
GLOVE BIOGEL PI IND STRL 7.5 (GLOVE) IMPLANT
GLOVE BIOGEL PI IND STRL 9 (GLOVE) ×1 IMPLANT
GLOVE BIOGEL PI INDICATOR 7.5 (GLOVE) ×6
GLOVE BIOGEL PI INDICATOR 9 (GLOVE) ×1
GLOVE INDICATOR 7.5 STRL GRN (GLOVE) ×2 IMPLANT
GLOVE INDICATOR 8.0 STRL GRN (GLOVE) ×2 IMPLANT
GLOVE SURG SYN 9.0  PF PI (GLOVE) ×1
GLOVE SURG SYN 9.0 PF PI (GLOVE) ×1 IMPLANT
GOWN STRL REUS W/ TWL LRG LVL3 (GOWN DISPOSABLE) ×2 IMPLANT
GOWN STRL REUS W/TWL 2XL LVL3 (GOWN DISPOSABLE) ×2 IMPLANT
GOWN STRL REUS W/TWL LRG LVL3 (GOWN DISPOSABLE) ×6
HOLDER FOLEY CATH W/STRAP (MISCELLANEOUS) ×2 IMPLANT
HOOD PEEL AWAY FLYTE STAYCOOL (MISCELLANEOUS) ×4 IMPLANT
KIT RM TURNOVER STRD PROC AR (KITS) ×2 IMPLANT
KNIFE SCULPS 14X20 (INSTRUMENTS) ×2 IMPLANT
LABEL OR SOLS (LABEL) ×2 IMPLANT
NDL SAFETY ECLIPSE 18X1.5 (NEEDLE) ×1 IMPLANT
NDL SPNL 20GX3.5 QUINCKE YW (NEEDLE) ×2 IMPLANT
NEEDLE HYPO 18GX1.5 SHARP (NEEDLE) ×2
NEEDLE SPNL 20GX3.5 QUINCKE YW (NEEDLE) ×4 IMPLANT
NS IRRIG 500ML POUR BTL (IV SOLUTION) ×2 IMPLANT
PACK TOTAL KNEE (MISCELLANEOUS) ×2 IMPLANT
PAD WRAPON POLAR KNEE (MISCELLANEOUS) ×1 IMPLANT
PIN DRILL QUICK PACK ×2 IMPLANT
PIN FIXATION 1/8DIA X 3INL (PIN) ×2 IMPLANT
PULSAVAC PLUS IRRIG FAN TIP (DISPOSABLE) ×2
SOL .9 NS 3000ML IRR  AL (IV SOLUTION) ×1
SOL .9 NS 3000ML IRR AL (IV SOLUTION) ×1
SOL .9 NS 3000ML IRR UROMATIC (IV SOLUTION) ×1 IMPLANT
SOL PREP PVP 2OZ (MISCELLANEOUS) ×2
SOLUTION PREP PVP 2OZ (MISCELLANEOUS) ×1 IMPLANT
SPONGE DRAIN TRACH 4X4 STRL 2S (GAUZE/BANDAGES/DRESSINGS) ×2 IMPLANT
STAPLER SKIN PROX 35W (STAPLE) ×2 IMPLANT
STOCKINETTE IMPERV 14X48 (MISCELLANEOUS) ×1 IMPLANT
STRAP TIBIA SHORT (MISCELLANEOUS) ×2 IMPLANT
SUCTION FRAZIER HANDLE 10FR (MISCELLANEOUS) ×1
SUCTION TUBE FRAZIER 10FR DISP (MISCELLANEOUS) ×1 IMPLANT
SUT VIC AB 0 CT1 36 (SUTURE) ×2 IMPLANT
SUT VIC AB 1 CT1 36 (SUTURE) ×4 IMPLANT
SUT VIC AB 2-0 CT2 27 (SUTURE) ×2 IMPLANT
SYR 20CC LL (SYRINGE) ×2 IMPLANT
SYR 30ML LL (SYRINGE) ×4 IMPLANT
TIP FAN IRRIG PULSAVAC PLUS (DISPOSABLE) ×1 IMPLANT
TOWEL OR 17X26 4PK STRL BLUE (TOWEL DISPOSABLE) ×2 IMPLANT
TOWER CARTRIDGE SMART MIX (DISPOSABLE) ×2 IMPLANT
TRAY FOLEY W/METER SILVER 16FR (SET/KITS/TRAYS/PACK) ×2 IMPLANT
WRAPON POLAR PAD KNEE (MISCELLANEOUS) ×2

## 2017-05-20 NOTE — Anesthesia Post-op Follow-up Note (Signed)
Anesthesia QCDR form completed.        

## 2017-05-20 NOTE — Discharge Instructions (Signed)
°  Instructions after Total Knee Replacement ° ° Dennise Bamber P. Suheyla Mortellaro, Jr., M.D.    ° Dept. of Orthopaedics & Sports Medicine ° [facility] ° [address] ° [place], [place]  [address] ° Phone: [phone]   Fax: [phone] ° °  °DIET: °• Drink plenty of non-alcoholic fluids. °• Resume your normal diet. Include foods high in fiber. ° °ACTIVITY:  °• You may use crutches or a walker with weight-bearing as tolerated, unless instructed otherwise. °• You may be weaned off of the walker or crutches by your Physical Therapist.  °• Do NOT place pillows under the knee. Anything placed under the knee could limit your ability to straighten the knee.   °• Continue doing gentle exercises. Exercising will reduce the pain and swelling, increase motion, and prevent muscle weakness.   °• Please continue to use the TED compression stockings for 6 weeks. You may remove the stockings at night, but should reapply them in the morning. °• Do not drive or operate any equipment until instructed. ° °WOUND CARE:  °• Continue to use the PolarCare or ice packs periodically to reduce pain and swelling. °• You may bathe or shower after the staples are removed at the first office visit following surgery. ° °MEDICATIONS: °• You may resume your regular medications. °• Please take the pain medication as prescribed on the medication. °• Do not take pain medication on an empty stomach. °• You have been given a prescription for a blood thinner (Lovenox or Coumadin). Please take the medication as instructed. (NOTE: After completing a 2 week course of Lovenox, take one Enteric-coated aspirin once a day. This along with elevation will help reduce the possibility of phlebitis in your operated leg.) °• Do not drive or drink alcoholic beverages when taking pain medications. ° °CALL THE OFFICE FOR: °• Temperature above 101 degrees °• Excessive bleeding or drainage on the dressing. °• Excessive swelling, coldness, or paleness of the toes. °• Persistent  nausea and vomiting. ° °FOLLOW-UP:  °• You should have an appointment to return to the office in 10-14 days after surgery. °• Arrangements have been made for continuation of Physical Therapy (either home therapy or outpatient therapy). °  °

## 2017-05-20 NOTE — Anesthesia Procedure Notes (Signed)
Procedure Name: MAC Performed by: Tecora Eustache, CRNA Pre-anesthesia Checklist: Patient identified, Emergency Drugs available, Suction available, Patient being monitored and Timeout performed Oxygen Delivery Method: Simple face mask       

## 2017-05-20 NOTE — Anesthesia Preprocedure Evaluation (Signed)
Anesthesia Evaluation  Patient identified by MRN, date of birth, ID band Patient awake    Reviewed: Allergy & Precautions, NPO status , Patient's Chart, lab work & pertinent test results  History of Anesthesia Complications Negative for: history of anesthetic complications  Airway Mallampati: II  TM Distance: >3 FB Neck ROM: Full    Dental  (+) Upper Dentures, Lower Dentures   Pulmonary neg sleep apnea, COPD,  COPD inhaler, former smoker,    breath sounds clear to auscultation- rhonchi (-) wheezing      Cardiovascular (-) hypertension(-) CAD, (-) Past MI and (-) Cardiac Stents  Rhythm:Regular Rate:Normal - Systolic murmurs and - Diastolic murmurs    Neuro/Psych negative neurological ROS  negative psych ROS   GI/Hepatic negative GI ROS, Neg liver ROS,   Endo/Other  negative endocrine ROSneg diabetes  Renal/GU negative Renal ROS     Musculoskeletal  (+) Arthritis ,   Abdominal (+) - obese,   Peds  Hematology negative hematology ROS (+)   Anesthesia Other Findings Past Medical History: 09/25/2015: Atherosclerosis of aortic arch (HCC)     Comment:  By CT scan  09/25/2015: CAD (coronary artery disease)     Comment:  Mild 3v by CT scan  No date: Cancer [facility])     Comment:  prostate No date: Colon polyps No date: COPD (chronic obstructive pulmonary disease) (HCC) No date: Diverticulosis of colon No date: Femoral bruit     Comment:  bilateral, found by Dr. Jefm Bryant No date: Heart murmur     Comment:  child No date: History of prostate cancer No date: IBS (irritable bowel syndrome)     Comment:  improved after retirement No date: Osteoarthritis     Comment:  knees and cervical/lumbar spine s/p surgeries and               injections No date: Pelvic fracture ([place]) No date: Pneumonia     Comment:  child 09/25/2015: Pulmonary nodules     Comment:  On screening CT scan, rpt 1 yr  No date: Shortness of breath  Comment:  doe   Reproductive/Obstetrics                             Lab Results  Component Value Date   WBC 4.5 05/07/2017   HGB 14.9 05/07/2017   HCT 44.6 05/07/2017   MCV 99.7 05/07/2017   PLT 165 05/07/2017    Anesthesia Physical Anesthesia Plan  ASA: II  Anesthesia Plan: Spinal   Post-op Pain Management:    Induction:   PONV Risk Score and Plan: 1 and Propofol infusion  Airway Management Planned: Natural Airway  Additional Equipment:   Intra-op Plan:   Post-operative Plan:   Informed Consent: I have reviewed the patients History and Physical, chart, labs and discussed the procedure including the risks, benefits and alternatives for the proposed anesthesia with the patient or authorized representative who has indicated his/her understanding and acceptance.   Dental advisory given  Plan Discussed with: CRNA and Anesthesiologist  Anesthesia Plan Comments:         Anesthesia Quick Evaluation

## 2017-05-20 NOTE — Anesthesia Procedure Notes (Signed)
Spinal  Patient location during procedure: OR Start time: 05/20/2017 7:24 AM End time: 05/20/2017 7:30 AM Staffing Anesthesiologist: Emmie Niemann, MD Resident/CRNA: Demetrius Charity, CRNA Performed: resident/CRNA  Preanesthetic Checklist Completed: patient identified, site marked, surgical consent, pre-op evaluation, timeout performed, IV checked, risks and benefits discussed and monitors and equipment checked Spinal Block Patient position: sitting Prep: ChloraPrep Patient monitoring: heart rate, continuous pulse ox, blood pressure and cardiac monitor Approach: midline Location: L4-5 Injection technique: single-shot Needle Needle type: Introducer and Whitacre  Needle gauge: 25 G Needle length: 9 cm Additional Notes Negative paresthesia. Negative blood return. Positive free-flowing CSF. Expiration date of kit checked and confirmed. Patient tolerated procedure well, without complications.

## 2017-05-20 NOTE — Progress Notes (Signed)
PT Cancellation Note  Patient Details Name: Carlos Olson MRN: [id number] DOB: 19-Jan-1941   Cancelled Treatment:    Reason Eval/Treat Not Completed: Medical issues which prohibited therapy; Pt presented with diminished sensation to light touch on the LLE and trace BLE ankle DF and PF AROM.  Per guidelines will hold PT eval and will attempt to see pt at a future date and time as appropriate.     Linus Salmons PT, DPT 05/20/17, 3:45 PM

## 2017-05-20 NOTE — Anesthesia Postprocedure Evaluation (Addendum)
Anesthesia Post Note  Patient: Carlos Olson  Procedure(s) Performed: COMPUTER ASSISTED TOTAL KNEE ARTHROPLASTY (Left Knee)  Patient location during evaluation: PACU Anesthesia Type: Spinal Level of consciousness: awake and alert Pain management: pain level controlled Vital Signs Assessment: post-procedure vital signs reviewed and stable Respiratory status: spontaneous breathing, nonlabored ventilation and respiratory function stable Cardiovascular status: blood pressure returned to baseline and stable Postop Assessment: no signs of nausea or vomiting, patient able to bend at knees and spinal receding Anesthetic complications: no     Last Vitals:  Vitals:   05/20/17 1250 05/20/17 1302  BP: 121/73   Pulse: (!) 52   Resp: (!) 21 16  Temp: (!) 36.3 C (!) 36.3 C  SpO2: 96% 99%    Last Pain:  Vitals:   05/20/17 1302  TempSrc:   PainSc: 0-No pain                 Jnai Snellgrove

## 2017-05-20 NOTE — Transfer of Care (Addendum)
Immediate Anesthesia Transfer of Care Note  Patient: Carlos Olson  Procedure(s) Performed: COMPUTER ASSISTED TOTAL KNEE ARTHROPLASTY (Left Knee)  Patient Location: PACU  Anesthesia Type:spinal  Level of Consciousness: awake, alert  and oriented  Airway & Oxygen Therapy: Patient Spontanous Breathing and Patient connected to face mask oxygen  Post-op Assessment: Report given to RN and Post -op Vital signs reviewed and stable  Post vital signs: Reviewed and stable  Last Vitals:  Vitals:   05/20/17 1151 05/20/17 1154  BP: 116/75 116/75  Pulse: 64 68  Resp: 18 (!) 22  Temp: 36.9 C 36.9 C  SpO2: 97% 99%    Last Pain:  Vitals:   05/20/17 0615  TempSrc: Oral         Complications: No apparent anesthesia complications

## 2017-05-20 NOTE — H&P (Signed)
The patient has been re-examined, and the chart reviewed, and there have been no interval changes to the documented history and physical.    The risks, benefits, and alternatives have been discussed at length. The patient expressed understanding of the risks benefits and agreed with plans for surgical intervention.  Jullisa Grigoryan P. Curvin Hunger, Jr. M.D.    

## 2017-05-20 NOTE — Op Note (Signed)
OPERATIVE NOTE  DATE OF SURGERY:  05/20/2017  PATIENT NAME:  Carlos Olson   DOB: [birth year]  MRN: [id number]  PRE-OPERATIVE DIAGNOSIS: Degenerative arthrosis of the left knee, primary  POST-OPERATIVE DIAGNOSIS:  Same  PROCEDURE:  Left total knee arthroplasty using computer-assisted navigation  SURGEON:  Marciano Sequin. M.D.  ASSISTANT:  Vance Peper, PA (present and scrubbed throughout the case, critical for assistance with exposure, retraction, instrumentation, and closure)  ANESTHESIA: spinal  ESTIMATED BLOOD LOSS: 50 mL  FLUIDS REPLACED: 1500 mL of crystalloid  TOURNIQUET TIME: 126 minutes  DRAINS: 2 medium Hemovac drains  SOFT TISSUE RELEASES: Anterior cruciate ligament, posterior cruciate ligament, deep and superficial medial collateral ligament, patellofemoral ligament  IMPLANTS UTILIZED: DePuy PFC Sigma size 6 posterior stabilized femoral component (cemented), size 6 MBT tibial component (cemented), 41 mm 3 peg oval dome patella (cemented), and a 10 mm stabilized rotating platform polyethylene insert.  INDICATIONS FOR SURGERY: Carlos Olson is a 76 y.o. year old male with a long history of progressive knee pain. X-rays demonstrated severe degenerative changes in tricompartmental fashion. The patient had not seen any significant improvement despite conservative nonsurgical intervention. After discussion of the risks and benefits of surgical intervention, the patient expressed understanding of the risks benefits and agree with plans for total knee arthroplasty.   The risks, benefits, and alternatives were discussed at length including but not limited to the risks of infection, bleeding, nerve injury, stiffness, blood clots, the need for revision surgery, cardiopulmonary complications, among others, and they were willing to proceed.  PROCEDURE IN DETAIL: The patient was brought into the operating room and, after adequate spinal anesthesia was achieved, a tourniquet was  placed on the patient's upper thigh. The patient's knee and leg were cleaned and prepped with alcohol and DuraPrep and draped in the usual sterile fashion. A "timeout" was performed as per usual protocol. The lower extremity was exsanguinated using an Esmarch, and the tourniquet was inflated to 300 mmHg. An anterior longitudinal incision was made followed by a standard mid vastus approach. The deep fibers of the medial collateral ligament were elevated in a subperiosteal fashion off of the medial flare of the tibia so as to maintain a continuous soft tissue sleeve. The patella was subluxed laterally and the patellofemoral ligament was incised. Inspection of the knee demonstrated severe degenerative changes with full-thickness loss of articular cartilage. Osteophytes were debrided using a rongeur. Anterior and posterior cruciate ligaments were excised. Two 4.0 mm Schanz pins were inserted in the femur and into the tibia for attachment of the array of trackers used for computer-assisted navigation. Hip center was identified using a circumduction technique. Distal landmarks were mapped using the computer. The distal femur and proximal tibia were mapped using the computer. The distal femoral cutting guide was positioned using computer-assisted navigation so as to achieve a 5 distal valgus cut. The femur was sized and it was felt that a size 6 femoral component was appropriate. A size 6 femoral cutting guide was positioned and the anterior cut was performed and verified using the computer. This was followed by completion of the posterior and chamfer cuts. Femoral cutting guide for the central box was then positioned in the center box cut was performed.  Attention was then directed to the proximal tibia. Medial and lateral menisci were excised. The extramedullary tibial cutting guide was positioned using computer-assisted navigation so as to achieve a 0 varus-valgus alignment and 0 posterior slope. The cut was  performed and verified using the  computer. The proximal tibia was sized and it was felt that a size 6 tibial tray was appropriate. Tibial and femoral trials were inserted followed by insertion of a 10 mm polyethylene insert.  The knee was felt to be tight both in flexion and in extension.  The trial components were removed and the cutting guide reapplied so is to resect an additional 2 mm of bone off of the proximal tibia.  The cut was performed using an oscillating saw and verified using the computer.  Troponins were reinserted.  The knee was felt to be tight medially. A Cobb elevator was used to elevate the superficial fibers of the medial collateral ligament.  This allowed for excellent mediolateral soft tissue balancing both in flexion and in full extension. Finally, the patella was cut and prepared so as to accommodate a 41 mm 3 peg oval dome patella. A patella trial was placed and the knee was placed through a range of motion with excellent patellar tracking appreciated. The femoral trial was removed after debridement of posterior osteophytes. The central post-hole for the tibial component was reamed followed by insertion of a keel punch. Tibial trials were then removed. Cut surfaces of bone were irrigated with copious amounts of normal saline with antibiotic solution using pulsatile lavage and then suctioned dry. Polymethylmethacrylate cement was prepared in the usual fashion using a vacuum mixer. Cement was applied to the cut surface of the proximal tibia as well as along the undersurface of a size 6 MBT tibial component. Tibial component was positioned and impacted into place. Excess cement was removed using [employer] fast streamer. Cement was then applied to the cut surfaces of the femur as well as along the posterior flanges of the size 6 femoral component. The femoral component was positioned and impacted into place. Excess cement was removed using [employer] fast streamer. A 10 mm polyethylene trial was inserted and the  knee was brought into full extension with steady axial compression applied. Finally, cement was applied to the backside of a 41 mm 3 peg oval dome patella and the patellar component was positioned and patellar clamp applied. Excess cement was removed using [employer] fast streamer. After adequate curing of the cement, the tourniquet was deflated after a total tourniquet time of 126 minutes. Hemostasis was achieved using electrocautery. The knee was irrigated with copious amounts of normal saline with antibiotic solution using pulsatile lavage and then suctioned dry. 20 mL of 1.3% Exparel and 60 mL of 0.25% Marcaine in 40 mL of normal saline was injected along the posterior capsule, medial and lateral gutters, and along the arthrotomy site. A 10 mm stabilized rotating platform polyethylene insert was inserted and the knee was placed through a range of motion with excellent mediolateral soft tissue balancing appreciated and excellent patellar tracking noted. 2 medium drains were placed in the wound bed and brought out through separate stab incisions. The medial parapatellar portion of the incision was reapproximated using interrupted sutures of #1 Vicryl. Subcutaneous tissue was approximated in layers using first #0 Vicryl followed #2-0 Vicryl. The skin was approximated with skin staples. A sterile dressing was applied.  The patient tolerated the procedure well and was transported to the recovery room in stable condition.    Carlos Olson., M.D.

## 2017-05-21 MED ORDER — BLISTEX MEDICATED EX OINT
TOPICAL_OINTMENT | CUTANEOUS | Status: DC | PRN
  Filled 2017-05-21: qty 6.3

## 2017-05-21 NOTE — Evaluation (Signed)
Physical Therapy Evaluation Patient Details Name: Carlos Olson MRN: [id number] DOB: 01-25-1941 Today's Date: 05/21/2017   History of Present Illness  Patient is a pleasant 76 y/o male that presents for L TKR on 05/20/2017  Clinical Impression  Patient noted to have elevated BP initially, discussed with RN and he has intermittently been running high, no other cardio-vascular signs/symptoms noted throughout this session. Patient has had R TKR in the past and is eager to participate in therapy, discussed with orthopedic surgeon, he had a flexion contracture pre-operatively and will require increased attention to gaining as much extension as possible. In gait, he is noted to have minimal knee flexion to initiate swing phase, though he is able to improve this with cuing. Patient demonstrates excellent tolerance to ambulation this date, and appears appropriate to return home with HHPT once he is medically appropriate for discharge.     Follow Up Recommendations Home health PT    Equipment Recommendations  Rolling walker with 5" wheels    Recommendations for Other Services       Precautions / Restrictions Precautions Precautions: Fall Restrictions Weight Bearing Restrictions: Yes LLE Weight Bearing: Weight bearing as tolerated      Mobility  Bed Mobility Overal bed mobility: Needs Assistance Bed Mobility: Supine to Sit     Supine to sit: Min guard;Min assist     General bed mobility comments: Patient requires min A to bring LLE to edge of bed, however he is able to complete transfer with trunk relatively independently.   Transfers Overall transfer level: Needs assistance Equipment used: Rolling walker (2 wheeled) Transfers: Sit to/from Stand Sit to Stand: Min assist;Mod assist;From elevated surface         General transfer comment: Patient transferred sit to stand with RW from elevated surface with min-mod A secondary to pain and decreased quad strength in LLE.    Ambulation/Gait Ambulation/Gait assistance: Min guard;Min assist Ambulation Distance (Feet): 125 Feet Assistive device: Rolling walker (2 wheeled) Gait Pattern/deviations: Step-through pattern   Gait velocity interpretation: <1.8 ft/sec, indicative of risk for recurrent falls General Gait Details: Patient noted to have decreased L knee flexion during gait, decreased gait speed but appropriate for this point in his rehabilitation. No buckling noted during gait.   Stairs            Wheelchair Mobility    Modified Rankin (Stroke Patients Only)       Balance Overall balance assessment: Needs assistance Sitting-balance support: Bilateral upper extremity supported Sitting balance-Leahy Scale: Good     Standing balance support: Bilateral upper extremity supported Standing balance-Leahy Scale: Good                               Pertinent Vitals/Pain Pain Assessment: 0-10 Pain Score: 4  Pain Location: L knee  Pain Descriptors / Indicators: Aching;Operative site guarding;Sore Pain Intervention(s): Limited activity within patient's tolerance;Repositioned;Ice applied;Monitored during session    [place] expects to be discharged to:: Private residence Living Arrangements: Spouse/significant other Available Help at Discharge: Family;Available 24 hours/day Type of Home: House Home Access: Stairs to enter Entrance Stairs-Rails: Right Entrance Stairs-Number of Steps: 7 Home Layout: One level Home Equipment: Walker - 2 wheels      Prior Function Level of Independence: Independent         Comments: Patient had been ambulating without RW prior to this admission.      Hand Dominance  Extremity/Trunk Assessment   Upper Extremity Assessment Upper Extremity Assessment: Overall WFL for tasks assessed    Lower Extremity Assessment Lower Extremity Assessment: LLE deficits/detail LLE Deficits / Details: Patient has slight  difficulty with SLRs, but good quad control in ambulation        Communication   Communication: No difficulties  Cognition Arousal/Alertness: Awake/alert Behavior During Therapy: WFL for tasks assessed/performed Overall Cognitive Status: Within Functional Limits for tasks assessed                                        General Comments      Exercises Total Joint Exercises Ankle Circles/Pumps: AROM;Both;15 reps Quad Sets: AROM;10 reps;Left Heel Slides: AROM;Right;AAROM;Left;10 reps Hip ABduction/ADduction: AROM;10 reps;Both Straight Leg Raises: AROM;10 reps;Both Long Arc Quad: AROM;Right;AAROM;Left;10 reps Goniometric ROM: 9-70   Assessment/Plan    PT Assessment Patient needs continued PT services  PT Problem List Decreased strength;Decreased mobility;Pain;Decreased balance;Decreased knowledge of use of DME;Decreased activity tolerance;Decreased range of motion       PT Treatment Interventions DME instruction;Therapeutic activities;Therapeutic exercise;Gait training;Patient/family education;Balance training;Stair training;Neuromuscular re-education;Manual techniques    PT Goals (Current goals can be found in the Care Plan section)  Acute Rehab PT Goals Patient Stated Goal: To return home  PT Goal Formulation: With patient Time For Goal Achievement: 06/04/17 Potential to Achieve Goals: Good    Frequency BID   Barriers to discharge        Co-evaluation               AM-PAC PT "6 Clicks" Daily Activity  Outcome Measure Difficulty turning over in bed (including adjusting bedclothes, sheets and blankets)?: A Little Difficulty moving from lying on back to sitting on the side of the bed? : A Little Difficulty sitting down on and standing up from a chair with arms (e.g., wheelchair, bedside commode, etc,.)?: A Lot Help needed moving to and from a bed to chair (including a wheelchair)?: A Little Help needed walking in hospital room?: A Little Help  needed climbing 3-5 steps with a railing? : A Lot 6 Click Score: 16    End of Session Equipment Utilized During Treatment: Gait belt Activity Tolerance: Patient tolerated treatment well Patient left: in chair;with call bell/phone within reach;with chair alarm set Nurse Communication: Mobility status(Elevated BP) PT Visit Diagnosis: Difficulty in walking, not elsewhere classified (R26.2);Pain Pain - Right/Left: Left Pain - part of body: Knee    Time: 9390-3009 PT Time Calculation (min) (ACUTE ONLY): 36 min   Charges:   PT Evaluation $PT Eval Moderate Complexity: 1 Mod PT Treatments $Therapeutic Exercise: 8-22 mins   PT G Codes:   PT G-Codes **NOT FOR INPATIENT CLASS** Functional Assessment Tool Used: AM-PAC 6 Clicks Basic Mobility Functional Limitation: Mobility: Walking and moving around Mobility: Walking and Moving Around Current Status (Q3300): At least 40 percent but less than 60 percent impaired, limited or restricted Mobility: Walking and Moving Around Goal Status [phone]): At least 1 percent but less than 20 percent impaired, limited or restricted   Royce Macadamia PT, DPT, CSCS    05/21/2017, 4:39 PM

## 2017-05-21 NOTE — Progress Notes (Signed)
  Subjective: 1 Day Post-Op Procedure(s) (LRB): COMPUTER ASSISTED TOTAL KNEE ARTHROPLASTY (Left) Patient reports pain as mild.   Patient is well, and has had no acute complaints or problems. Sitting on the side of the bed upon entering the room this morning. PT and Care management to assist with discharge planning. Negative for chest pain and shortness of breath Fever: no Gastrointestinal:Negative for nausea and vomiting  Objective: Vital signs in last 24 hours: Temp:  [97.4 F (36.3 C)-98.4 F (36.9 C)] 98.3 F (36.8 C) (11/22 0756) Pulse Rate:  [45-74] 73 (11/22 0756) Resp:  [12-23] 14 (11/22 0756) BP: (111-152)/(68-83) 143/79 (11/22 0756) SpO2:  [91 %-99 %] 92 % (11/22 0756)  Intake/Output from previous day:  Intake/Output Summary (Last 24 hours) at 05/21/2017 0905 Last data filed at 05/21/2017 0321 Gross per 24 hour  Intake 2353.33 ml  Output 2855 ml  Net -501.67 ml    Intake/Output this shift: No intake/output data recorded.  Labs: No results for input(s): HGB in the last 72 hours. No results for input(s): WBC, RBC, HCT, PLT in the last 72 hours. No results for input(s): NA, K, CL, CO2, BUN, CREATININE, GLUCOSE, CALCIUM in the last 72 hours. No results for input(s): LABPT, INR in the last 72 hours.   EXAM General - Patient is Alert, Appropriate and Oriented Extremity - ABD soft Sensation intact distally Intact pulses distally Dorsiflexion/Plantar flexion intact Incision: dressing C/D/I No cellulitis present Dressing/Incision - clean, dry, no drainage, bulky dressing intact, hemovac intact. Motor Function - intact, moving foot and toes well on exam.  Abdomen soft with normal BS, no tympany.  Past Medical History:  Diagnosis Date  . Atherosclerosis of aortic arch ([place]) 09/25/2015   By CT scan   . CAD (coronary artery disease) 09/25/2015   Mild 3v by CT scan   . Cancer [facility])    prostate  . Colon polyps   . COPD (chronic obstructive pulmonary disease)  ([place])   . Diverticulosis of colon   . Femoral bruit    bilateral, found by Dr. Jefm Bryant  . Heart murmur    child  . History of prostate cancer   . IBS (irritable bowel syndrome)    improved after retirement  . 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 Pelvic fracture ([place])   . Pneumonia    child  . Pulmonary nodules 09/25/2015   On screening CT scan, rpt 1 yr   . Shortness of breath    doe    Assessment/Plan: 1 Day Post-Op Procedure(s) (LRB): COMPUTER ASSISTED TOTAL KNEE ARTHROPLASTY (Left) Active Problems:   S/P total knee arthroplasty  Estimated body mass index is 26.87 kg/m as calculated from the following:   Height as of this encounter: 6\' 3"  (1.905 m).   Weight as of this encounter: 97.5 kg (215 lb). [facility] diet Up with therapy D/C IV fluids when tolerating po intake.  Doing well this AM. Will removed bulky dressing tomorrow, will removed hemovac tomorrow. Begin working on having a BM, continue to monitor urine output. Plan will be for possible discharge home tomorrow.  DVT Prophylaxis - Lovenox, Foot Pumps and TED hose Weight-Bearing as tolerated to left leg  J. Cameron Proud, PA-C [facility] Orthopaedic Surgery 05/21/2017, 9:05 AM

## 2017-05-21 NOTE — Clinical Social Work Note (Signed)
CSW received consult for placement. CSW will assess when able.   Marciel Offenberger Martha Jia Dottavio, MSW, LCSWA [phone] 

## 2017-05-22 ENCOUNTER — Other Ambulatory Visit: Payer: Self-pay

## 2017-05-22 MED ORDER — ENOXAPARIN SODIUM 40 MG/0.4ML ~~LOC~~ SOLN: 40.0000 mg | SUBCUTANEOUS | 0 refills | Status: DC

## 2017-05-22 MED ORDER — OXYCODONE HCL 5 MG PO TABS: 5.0000 mg | ORAL_TABLET | ORAL | 0 refills | Status: DC | PRN

## 2017-05-22 NOTE — Care Management Note (Signed)
Case Management Note  Patient Details  Name: Carlos Olson MRN: [id number] Date of Birth: 1940/12/15  Subjective/Objective:  POD # 2 left TKA. Met with patient and spouse at bedside. He has used [facility] in the past and would like to use them again. Referral to [facility] for HHPT. He has a walker. [facility]: [facility] [phone]. Will call Lovenox prior to discharge for price. Patient lives with spouse and she will be his caregiver at dc.                    Action/Plan: [facility] for HHPT.   Expected Discharge Date:                  Expected Discharge Plan:  [place]  In-House Referral:     Discharge planning Services  CM Consult  Post Acute Care Choice:  Home Health Choice offered to:  Patient, Spouse  DME Arranged:    DME Agency:     HH Arranged:  PT [place]:  [place]  Status of Service:  Completed, signed off  If discussed at [place] of Stay Meetings, dates discussed:    Additional Comments:  Jolly Mango, RN 05/22/2017, 9:44 AM

## 2017-05-22 NOTE — Evaluation (Signed)
Occupational Therapy Evaluation Patient Details Name: Carlos Olson MRN: [id number] DOB: 02-Aug-1940 Today's Date: 05/22/2017    History of Present Illness Patient is a pleasant 76 y/o male that presents for L TKR on 05/20/2017   Clinical Impression   Pt is 76 year old male s/p L TKR. Had R TKR done approx. 5 years ago.  Pt was independent in all ADLs prior to surgery and is eager to return to PLOF.  Pt currently requires minimal assist for LB dressing while in seated position due to pain and limited AROM of L knee. Pt/family verbalized understanding of all education provided by OT in AE/DME for bathing/dressing/toileting, compression stocking mgt, and polar care mgt. Pt would benefit from instruction in dressing techniques with or without assistive devices for dressing and bathing skills. Family with good set up of home environment and assist available. No additional skilled OT needs identified at this time. Will sign off. Please re-consult if additional needs arise.      Follow Up Recommendations  No OT follow up    Equipment Recommendations  None recommended by OT    Recommendations for Other Services       Precautions / Restrictions Precautions Precautions: Fall Restrictions Weight Bearing Restrictions: Yes LLE Weight Bearing: Weight bearing as tolerated      Mobility Bed Mobility               General bed mobility comments: deferred, pt up in recliner   Transfers Overall transfer level: Needs assistance Equipment used: Rolling walker (2 wheeled) Transfers: Sit to/from Stand Sit to Stand: Min guard              Balance Overall balance assessment: Needs assistance Sitting-balance support: Bilateral upper extremity supported Sitting balance-Leahy Scale: Good     Standing balance support: Bilateral upper extremity supported Standing balance-Leahy Scale: Good                             ADL either performed or assessed with clinical judgement    ADL Overall ADL's : Needs assistance/impaired Eating/Feeding: Sitting;Set up   Grooming: Standing;Supervision/safety   Upper Body Bathing: Sitting;Set up   Lower Body Bathing: Sit to/from stand;Supervison/ safety;Set up   Upper Body Dressing : Set up;Sitting   Lower Body Dressing: Minimal assistance;Sit to/from stand Lower Body Dressing Details (indicate cue type and reason): pt/family verbalized understanding of education provided in AE for LB dressing with verbal instruction and visual demonstration. Spouse able to assist as needed. Toilet Transfer: Min guard;Comfort height toilet;RW;Ambulation           Functional mobility during ADLs: Min guard;Rolling walker General ADL Comments: Pt/family verbalized understanding of education provided in compression stocking mgt, polar care mgt, and falls prevention strategies.     Vision Baseline Vision/History: No visual deficits Patient Visual Report: No change from baseline       Perception     Praxis      Pertinent Vitals/Pain Pain Assessment: 0-10 Pain Score: 2  Pain Location: L knee at rest, 7/10 with mobility Pain Descriptors / Indicators: Aching;Operative site guarding;Sore Pain Intervention(s): Limited activity within patient's tolerance;Monitored during session;Premedicated before session;Ice applied     Hand Dominance     Extremity/Trunk Assessment Upper Extremity Assessment Upper Extremity Assessment: Overall WFL for tasks assessed   Lower Extremity Assessment Lower Extremity Assessment: Defer to PT evaluation;LLE deficits/detail LLE Deficits / Details: anticipated strength/ROM deficits post-op   Cervical / Trunk Assessment  Cervical / Trunk Assessment: Normal   Communication Communication Communication: No difficulties   Cognition Arousal/Alertness: Awake/alert Behavior During Therapy: WFL for tasks assessed/performed Overall Cognitive Status: Within Functional Limits for tasks assessed                                      General Comments       Exercises     Shoulder Instructions      Home Living Family/patient expects to be discharged to:: Private residence Living Arrangements: Spouse/significant other Available Help at Discharge: Family;Available 24 hours/day Type of Home: House Home Access: Stairs to enter [employer] of Steps: 7 Entrance Stairs-Rails: Right Home Layout: One level     Bathroom Shower/Tub: Occupational psychologist: Handicapped height     Home Equipment: Environmental consultant - 2 wheels;Grab bars - tub/shower;Adaptive equipment Adaptive Equipment: Reacher;Sock aid        Prior Functioning/Environment Level of Independence: Independent        Comments: Patient had been ambulating without RW prior to this admission. No falls. Independent with ADL.        OT Problem List:        OT Treatment/Interventions:      OT Goals(Current goals can be found in the care plan section) Acute Rehab OT Goals Patient Stated Goal: To return home  OT Goal Formulation: All assessment and education complete, DC therapy  OT Frequency:     Barriers to D/C:            Co-evaluation              AM-PAC PT "6 Clicks" Daily Activity     Outcome Measure Help from another person eating meals?: None Help from another person taking care of personal grooming?: None Help from another person toileting, which includes using toliet, bedpan, or urinal?: A Little Help from another person bathing (including washing, rinsing, drying)?: A Little Help from another person to put on and taking off regular upper body clothing?: None Help from another person to put on and taking off regular lower body clothing?: A Little 6 Click Score: 21   End of Session Equipment Utilized During Treatment: Gait belt;Rolling walker  Activity Tolerance: Patient tolerated treatment well Patient left: in chair;with call bell/phone within reach;with chair alarm set;with  family/visitor present;with SCD's reapplied;Other (comment)(polar care in place)  OT Visit Diagnosis: Other abnormalities of gait and mobility (R26.89)                Time: 0100-7121 OT Time Calculation (min): 18 min Charges:  OT General Charges $OT Visit: 1 Visit OT Evaluation $OT Eval Low Complexity: 1 Low OT Treatments $Self Care/Home Management : 8-22 mins G-Codes: OT G-codes **NOT FOR INPATIENT CLASS** Functional Assessment Tool Used: AM-PAC 6 Clicks Daily Activity;Clinical judgement Functional Limitation: Self care Self Care Current Status (F7588): At least 1 percent but less than 20 percent impaired, limited or restricted Self Care Goal Status (T2549): At least 1 percent but less than 20 percent impaired, limited or restricted Self Care Discharge Status [phone]): At least 1 percent but less than 20 percent impaired, limited or restricted   Jeni Salles, MPH, MS, OTR/L ascom [phone] 05/22/17, 9:57 AM

## 2017-05-22 NOTE — Progress Notes (Signed)
  Subjective: 2 Days Post-Op Procedure(s) (LRB): COMPUTER ASSISTED TOTAL KNEE ARTHROPLASTY (Left) Patient reports pain as mild.   Patient is well, and has had no acute complaints or problems. Had bowel movement prior to entering the room. Plan is for discharge to home with HHPT. Negative for chest pain and shortness of breath Fever: no Gastrointestinal:Negative for nausea and vomiting  Objective: Vital signs in last 24 hours: Temp:  [98.1 F (36.7 C)-98.4 F (36.9 C)] 98.1 F (36.7 C) (11/23 0832) Pulse Rate:  [83-88] 88 (11/23 0832) Resp:  [16-18] 17 (11/23 0832) BP: (145-177)/(70-90) 149/90 (11/23 0832) SpO2:  [90 %-96 %] 96 % (11/23 0832)  Intake/Output from previous day:  Intake/Output Summary (Last 24 hours) at 05/22/2017 1235 Last data filed at 05/22/2017 1000 Gross per 24 hour  Intake 960 ml  Output 410 ml  Net 550 ml    Intake/Output this shift: Total I/O In: 480 [P.O.:480] Out: 10 [Drains:10]  Labs: No results for input(s): HGB in the last 72 hours. No results for input(s): WBC, RBC, HCT, PLT in the last 72 hours. No results for input(s): NA, K, CL, CO2, BUN, CREATININE, GLUCOSE, CALCIUM in the last 72 hours. No results for input(s): LABPT, INR in the last 72 hours.   EXAM General - Patient is Alert, Appropriate and Oriented Extremity - ABD soft Sensation intact distally Intact pulses distally Dorsiflexion/Plantar flexion intact Incision: dressing C/D/I No cellulitis present Dressing/Incision - clean, dry, no drainage.  Bulky dressing removed today, hemovac removed, 4x4 with Tegaderm applied.  Moderate bloody drainage to the honeycomb dressing. Motor Function - intact, moving foot and toes well on exam.  Abdomen soft with normal BS, no tympany.  Past Medical History:  Diagnosis Date  . Atherosclerosis of aortic arch ([place]) 09/25/2015   By CT scan   . CAD (coronary artery disease) 09/25/2015   Mild 3v by CT scan   . Cancer [facility])    prostate  .  Colon polyps   . COPD (chronic obstructive pulmonary disease) ([place])   . Diverticulosis of colon   . Femoral bruit    bilateral, found by Dr. Jefm Bryant  . Heart murmur    child  . History of prostate cancer   . IBS (irritable bowel syndrome)    improved after retirement  . 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 Pelvic fracture ([place])   . Pneumonia    child  . Pulmonary nodules 09/25/2015   On screening CT scan, rpt 1 yr   . Shortness of breath    doe    Assessment/Plan: 2 Days Post-Op Procedure(s) (LRB): COMPUTER ASSISTED TOTAL KNEE ARTHROPLASTY (Left) Active Problems:   S/P total knee arthroplasty  Estimated body mass index is 26.87 kg/m as calculated from the following:   Height as of this encounter: 6\' 3"  (1.905 m).   Weight as of this encounter: 97.5 kg (215 lb). Advance diet Up with therapy  Doing well this AM. Hemovac removed today. Pt has had a BM. Plan for discharge home this afternoon, dressing change prior to discharge.  DVT Prophylaxis - Lovenox, Foot Pumps and TED hose Weight-Bearing as tolerated to left leg  J. Cameron Proud, PA-C [facility] 05/22/2017, 12:35 PM

## 2017-05-22 NOTE — Plan of Care (Signed)
  Progressing Education: Knowledge of General Education information will improve 05/22/2017 0508 - Progressing by Lucrecia Mcphearson, Lucille Passy, RN Health Behavior/Discharge Planning: Ability to manage health-related needs will improve 05/22/2017 0508 - Progressing by Leydy Worthey, Lucille Passy, RN Clinical Measurements: Ability to maintain clinical measurements within normal limits will improve 05/22/2017 0508 - Progressing by Rajah Lamba, Lucille Passy, RN Will remain free from infection 05/22/2017 0508 - Progressing by Revere Maahs, Lucille Passy, RN Diagnostic test results will improve 05/22/2017 [phone] - Progressing by Joash Tony, Lucille Passy, RN Respiratory complications will improve 05/22/2017 0508 - Progressing by Bryna Colander, RN Cardiovascular complication will be avoided 05/22/2017 0508 - Progressing by Loki Wuthrich, Lucille Passy, RN Activity: Risk for activity intolerance will decrease 05/22/2017 0508 - Progressing by Bryna Colander, RN Nutrition: Adequate nutrition will be maintained 05/22/2017 0508 - Progressing by Bryna Colander, RN Coping: Level of anxiety will decrease 05/22/2017 0508 - Progressing by Bryna Colander, RN Elimination: Will not experience complications related to bowel motility 05/22/2017 0508 - Progressing by Marsela Kuan, Lucille Passy, RN Will not experience complications related to urinary retention 05/22/2017 0508 - Progressing by Myrla Malanowski, Lucille Passy, RN Pain Managment: General experience of comfort will improve 05/22/2017 0508 - Progressing by Mardy Hoppe, Lucille Passy, RN Safety: Ability to remain free from injury will improve 05/22/2017 0508 - Progressing by Sabian Kuba, Lucille Passy, RN Skin Integrity: Risk for impaired skin integrity will decrease 05/22/2017 0508 - Progressing by Lalana Wachter, Lucille Passy, RN

## 2017-05-22 NOTE — Progress Notes (Signed)
DISCHARGE NOTE:  Pt given discharge instructions and prescriptions (lovenox/ oxycodone). Pt verbalized understanding. Pts surgical changed, 2 extra honeycomb dressings given to Pt. TED hose on both legs.

## 2017-05-22 NOTE — Progress Notes (Signed)
Clinical Social Worker (CSW) received SNF consult. PT is recommending home health. RN case manager aware of above. Please reconsult if future social work needs arise. CSW signing off.   Doloris Servantes, LCSW [phone] 

## 2017-05-22 NOTE — Progress Notes (Signed)
Patient is A+O with no signs of distress.  Pain is controlled with prn tylenol.  Milk of magnesia administered and patient had one small bm.  Up to chair this am for one hour.  Ambulates well to restroom.

## 2017-05-22 NOTE — Care Management Note (Signed)
Case Management Note  Patient Details  Name: Carlos Olson MRN: [id number] Date of Birth: 11/05/1940  Subjective/Objective:  Called Lovenox 40 mg # 14 no refills to [facility] per dc summary                 Action/Plan:   Expected Discharge Date:  05/22/17               Expected Discharge Plan:  [place]  In-House Referral:     Discharge planning Services  CM Consult  Post Acute Care Choice:  Home Health Choice offered to:  Patient, Spouse  DME Arranged:    DME Agency:     HH Arranged:  PT [place]:  [place]  Status of Service:  Completed, signed off  If discussed at [place] of Stay Meetings, dates discussed:    Additional Comments:  Jolly Mango, RN 05/22/2017, 1:57 PM

## 2017-05-22 NOTE — Progress Notes (Signed)
Physical Therapy Treatment Patient Details Name: Carlos Olson MRN: [id number] DOB: 08/09/1940 Today's Date: 05/22/2017    History of Present Illness Patient is a pleasant 76 y/o male that presents for L TKR on 05/20/2017    PT Comments    Pt has met goals for therapy. Pt is very motivated to participate. Good progress with AAROM, although limited by pain. Able to progress gait distance with fluid gait observed. Stair training not performed as pt reports he has no stairs at home and remembers sequencing from previous Sx. Reviewed sequencing. Good endurance with HEP, reviewed for home use.    Follow Up Recommendations  Home health PT     Equipment Recommendations       Recommendations for Other Services       Precautions / Restrictions Precautions Precautions: Fall;Knee Precaution Booklet Issued: Yes (comment) Restrictions Weight Bearing Restrictions: Yes LLE Weight Bearing: Weight bearing as tolerated    Mobility  Bed Mobility Overal bed mobility: Needs Assistance Bed Mobility: Supine to Sit     Supine to sit: Min guard     General bed mobility comments: safe technique performed with assist for guidance of L leg to floor  Transfers Overall transfer level: Needs assistance Equipment used: Rolling walker (2 wheeled) Transfers: Sit to/from Stand Sit to Stand: Min guard         General transfer comment: safe technique with RW. Bed slightly elevated for ease of transfer  Ambulation/Gait Ambulation/Gait assistance: Min guard Ambulation Distance (Feet): 200 Feet Assistive device: Rolling walker (2 wheeled) Gait Pattern/deviations: Step-through pattern     General Gait Details: improved gait pattern with ability to increase to symmetrical step length. Cues given for upright posture and to look forwards during ambulation. Good speed noted today.   Stairs            Wheelchair Mobility    Modified Rankin (Stroke Patients Only)       Balance  Overall balance assessment: Needs assistance Sitting-balance support: Bilateral upper extremity supported Sitting balance-Leahy Scale: Good     Standing balance support: Bilateral upper extremity supported Standing balance-Leahy Scale: Good                              Cognition Arousal/Alertness: Awake/alert Behavior During Therapy: WFL for tasks assessed/performed Overall Cognitive Status: Within Functional Limits for tasks assessed                                        Exercises Total Joint Exercises Goniometric ROM: L knee AAROM: 6-83 degrees Other Exercises Other Exercises: Supine ther-ex performed including ankle pumps, quad sets, SLRs, hip abd/add, SAQ, and seated knee flexion stretches. All ther-ex performed x 12 reps with cga. HEP given and reviewed with pt.    General Comments        Pertinent Vitals/Pain Pain Assessment: 0-10 Pain Score: 7  Pain Location: L knee at rest, 7/10 with mobility Pain Descriptors / Indicators: Aching;Operative site guarding;Sore Pain Intervention(s): Limited activity within patient's tolerance;RN gave pain meds during session;Ice applied;Repositioned    Home Living Family/patient expects to be discharged to:: Private residence Living Arrangements: Spouse/significant other Available Help at Discharge: Family;Available 24 hours/day Type of Home: House Home Access: Stairs to enter Entrance Stairs-Rails: Right Home Layout: One level Home Equipment: Walker - 2 wheels;Grab bars - tub/shower;Adaptive equipment  Prior Function Level of Independence: Independent      Comments: Patient had been ambulating without RW prior to this admission. No falls. Independent with ADL.   PT Goals (current goals can now be found in the care plan section) Acute Rehab PT Goals Patient Stated Goal: To return home  PT Goal Formulation: With patient Time For Goal Achievement: 06/04/17 Potential to Achieve Goals:  Good Progress towards PT goals: Progressing toward goals    Frequency    BID      PT Plan Current plan remains appropriate    Co-evaluation              AM-PAC PT "6 Clicks" Daily Activity  Outcome Measure  Difficulty turning over in bed (including adjusting bedclothes, sheets and blankets)?: None Difficulty moving from lying on back to sitting on the side of the bed? : Unable Difficulty sitting down on and standing up from a chair with arms (e.g., wheelchair, bedside commode, etc,.)?: Unable Help needed moving to and from a bed to chair (including a wheelchair)?: None Help needed walking in hospital room?: None Help needed climbing 3-5 steps with a railing? : A Little 6 Click Score: 17    End of Session Equipment Utilized During Treatment: Gait belt Activity Tolerance: Patient tolerated treatment well Patient left: in chair;with call bell/phone within reach;with chair alarm set Nurse Communication: Mobility status PT Visit Diagnosis: Difficulty in walking, not elsewhere classified (R26.2);Pain Pain - Right/Left: Left Pain - part of body: Knee     Time: 0850-0928 PT Time Calculation (min) (ACUTE ONLY): 38 min  Charges:  $Gait Training: 23-37 mins $Therapeutic Exercise: 8-22 mins                    G Codes:  Functional Assessment Tool Used: AM-PAC 6 Clicks Basic Mobility Functional Limitation: Mobility: Walking and moving around Mobility: Walking and Moving Around Current Status (R5615): At least 40 percent but less than 60 percent impaired, limited or restricted Mobility: Walking and Moving Around Goal Status [phone]): At least 20 percent but less than 40 percent impaired, limited or restricted    Greggory Stallion, PT, DPT [phone]    Tawfiq Favila 05/22/2017, 12:01 PM

## 2017-05-22 NOTE — Discharge Summary (Signed)
Physician Discharge Summary  Patient ID: Carlos Olson MRN: [id number] DOB/AGE: 1940/08/15 76 y.o.  Admit date: 05/20/2017 Discharge date: 05/22/2017  Admission Diagnoses:  PRIMARY OSTEOARTHRITIS OF LEFT KNEE  Discharge Diagnoses: Patient Active Problem List   Diagnosis Date Noted  . S/P total knee arthroplasty 05/20/2017  . Osteoarthritis 09/22/2016  . Pulmonary nodules 09/25/2015  . CAD (coronary artery disease) 09/25/2015  . Atherosclerosis of aortic arch ([place]) 09/25/2015  . [place] 08/17/2015  . Advanced care planning/counseling discussion 09/08/2014  . Health maintenance examination 09/08/2014  . 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 Spinal stenosis in cervical region 11/22/2013  . Medicare annual wellness visit, subsequent 06/22/2012  . Cervical stenosis of spinal canal 07/29/2011    Class: Chronic  . SHOULDER PAIN, RIGHT 04/18/2010  . History of tobacco abuse 12/02/2006  . COPD (chronic obstructive pulmonary disease) ([place]) 12/01/2006  . DIVERTICULOSIS, COLON 12/01/2006  . BACK PAIN, CHRONIC 12/01/2006  . PROSTATE CANCER, HX OF 12/01/2006  Primary osteoarthritis of the left knee.  Past Medical History:  Diagnosis Date  . Atherosclerosis of aortic arch ([place]) 09/25/2015   By CT scan   . CAD (coronary artery disease) 09/25/2015   Mild 3v by CT scan   . Cancer [facility])    prostate  . Colon polyps   . COPD (chronic obstructive pulmonary disease) ([place])   . Diverticulosis of colon   . Femoral bruit    bilateral, found by Dr. Jefm Bryant  . Heart murmur    child  . History of prostate cancer   . IBS (irritable bowel syndrome)    improved after retirement  . 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 Pelvic fracture ([place])   . Pneumonia    child  . Pulmonary nodules 09/25/2015   On screening CT scan, rpt 1 yr   . Shortness of breath    doe     Transfusion: None.   Consultants (if any):    Discharged Condition: Improved  Hospital Course: CORDARO MUKAI is an 76 y.o. male who was admitted 05/20/2017 with a diagnosis of primary osteoarthritis of the left knee and went to the operating room on 05/20/2017 and underwent the above named procedures.    Surgeries: Procedure(s): COMPUTER ASSISTED TOTAL KNEE ARTHROPLASTY on 05/20/2017 Patient tolerated the surgery well. Taken to PACU where she was stabilized and then transferred to the orthopedic floor.  Started on Lovenox 30mg  q 12 hrs. Foot pumps applied bilaterally at 80 mm. Heels elevated on bed with rolled towels. No evidence of DVT. Negative [place]. Physical therapy started on day #1 for gait training and transfer. OT started day #1 for ADL and assisted devices.  Patient's IV was d/c on POD1, foley d/c on POD1.  Hemovac removed on POD2.  Implants: DePuy PFC Sigma size 6 posterior stabilized femoral component (cemented), size 6 MBT tibial component (cemented), 41 mm 3 peg oval dome patella (cemented), and a 10 mm stabilized rotating platform polyethylene insert.  He was given perioperative antibiotics:  Anti-infectives (From admission, onward)   Start     Dose/Rate Route Frequency Ordered Stop   05/20/17 1330  ceFAZolin (ANCEF) 2 g in dextrose 5 % 100 mL IVPB     2 g 240 mL/hr over 30 Minutes Intravenous Every 6 hours 05/20/17 1318 05/21/17 0843   05/20/17 0607  ceFAZolin (ANCEF) 2-4 GM/100ML-% IVPB    Comments:  Lyman Bishop   : cabinet override  05/20/17 0607 05/20/17 0736   05/20/17 0600  ceFAZolin (ANCEF) IVPB 2g/100 mL premix     2 g 200 mL/hr over 30 Minutes Intravenous On call to O.R. 05/19/17 2130 05/20/17 0751    .  He was given sequential compression devices, early ambulation, and Lovenox for DVT prophylaxis.  He benefited maximally from the hospital stay and there were no complications.    Recent vital signs:  Vitals:   05/22/17 0458 05/22/17 0832  BP: (!) 145/85 (!) 149/90  Pulse: 88 88  Resp:  16 17  Temp: 98.4 F (36.9 C) 98.1 F (36.7 C)  SpO2: 92% 96%    Recent laboratory studies:  Lab Results  Component Value Date   HGB 14.9 05/07/2017   HGB 15.4 09/18/2016   HGB 15.2 11/16/2013   Lab Results  Component Value Date   WBC 4.5 05/07/2017   PLT 165 05/07/2017   Lab Results  Component Value Date   INR 0.97 05/07/2017   Lab Results  Component Value Date   NA 137 05/07/2017   K 4.1 05/07/2017   CL 105 05/07/2017   CO2 23 05/07/2017   BUN 18 05/07/2017   CREATININE 0.96 05/07/2017   GLUCOSE 93 05/07/2017    Discharge Medications:   Allergies as of 05/22/2017      Reactions   Sulfonamide Derivatives Itching   Sulfasalazine Itching      Medication List    TAKE these medications   acetaminophen 500 MG tablet Commonly known as:  TYLENOL Take 1,000 mg by mouth 3 (three) times daily as needed for moderate pain or headache.   enoxaparin 40 MG/0.4ML injection Commonly known as:  LOVENOX Inject 0.4 mLs (40 mg total) into the skin daily.   fluticasone furoate-vilanterol 100-25 MCG/INH Aepb Commonly known as:  BREO ELLIPTA Inhale 1 puff into the lungs daily. What changed:    when to take this  reasons to take this   MUSCLE RUB 10-15 % Crea Apply 1 application topically as needed for muscle pain.   oxyCODONE 5 MG immediate release tablet Commonly known as:  Oxy IR/ROXICODONE Take 1-2 tablets (5-10 mg total) by mouth every 4 (four) hours as needed for severe pain.            Durable Medical Equipment  (From admission, onward)        Start     Ordered   05/20/17 1319  DME Walker rolling  Once    Question:  Patient needs a walker to treat with the following condition  Answer:  Total knee replacement status   05/20/17 1318   05/20/17 1319  DME Bedside commode  Once    Question:  Patient needs a bedside commode to treat with the following condition  Answer:  Total knee replacement status   05/20/17 1318      Diagnostic Studies: Dg Knee  Left Port  Result Date: 05/20/2017 CLINICAL DATA:  Degenerative arthrosis of the left knee. Status post total knee replacement. EXAM: PORTABLE LEFT KNEE - 1-2 VIEW COMPARISON:  None. FINDINGS: The components of the total knee prosthesis appear in excellent position. Soft tissue drains in place. IMPRESSION: Satisfactory appearance of the left knee after total knee replacement. Electronically Signed   By: Lorriane Shire M.D.   On: 05/20/2017 12:08   Disposition: 01-Home or Self Care  Follow-up Information    Duanne Guess, PA-C Follow up on 06/03/2017.   Specialties:  Orthopedic Surgery, Emergency Medicine Why:  at 10:45am Contact information:  [address] [place] [address] [phone]        Dereck Leep, MD Follow up on 07/02/2017.   Specialty:  Orthopedic Surgery Why:  at 10:00am Contact information: [place] [address] [phone]          Signed: Judson Roch PA-C 05/22/2017, 12:38 PM

## 2017-05-23 DIAGNOSIS — R911 Solitary pulmonary nodule: Secondary | ICD-10-CM | POA: Diagnosis not present

## 2017-05-23 DIAGNOSIS — K5790 Diverticulosis of intestine, part unspecified, without perforation or abscess without bleeding: Secondary | ICD-10-CM | POA: Diagnosis not present

## 2017-05-23 DIAGNOSIS — I700 Atherosclerosis of aorta: Secondary | ICD-10-CM | POA: Diagnosis not present

## 2017-05-23 DIAGNOSIS — M1991 Primary osteoarthritis, unspecified site: Secondary | ICD-10-CM | POA: Diagnosis not present

## 2017-05-23 DIAGNOSIS — Z471 Aftercare following joint replacement surgery: Secondary | ICD-10-CM | POA: Diagnosis not present

## 2017-05-23 DIAGNOSIS — M4712 Other spondylosis with myelopathy, cervical region: Secondary | ICD-10-CM | POA: Diagnosis not present

## 2017-05-23 DIAGNOSIS — I2510 Atherosclerotic heart disease of native coronary artery without angina pectoris: Secondary | ICD-10-CM | POA: Diagnosis not present

## 2017-05-23 DIAGNOSIS — Z96652 Presence of left artificial knee joint: Secondary | ICD-10-CM | POA: Diagnosis not present

## 2017-05-23 DIAGNOSIS — M48061 Spinal stenosis, lumbar region without neurogenic claudication: Secondary | ICD-10-CM | POA: Diagnosis not present

## 2017-05-23 DIAGNOSIS — J449 Chronic obstructive pulmonary disease, unspecified: Secondary | ICD-10-CM | POA: Diagnosis not present

## 2017-05-25 DIAGNOSIS — I700 Atherosclerosis of aorta: Secondary | ICD-10-CM | POA: Diagnosis not present

## 2017-05-25 DIAGNOSIS — I2510 Atherosclerotic heart disease of native coronary artery without angina pectoris: Secondary | ICD-10-CM | POA: Diagnosis not present

## 2017-05-25 DIAGNOSIS — J449 Chronic obstructive pulmonary disease, unspecified: Secondary | ICD-10-CM | POA: Diagnosis not present

## 2017-05-25 DIAGNOSIS — M4712 Other spondylosis with myelopathy, cervical region: Secondary | ICD-10-CM | POA: Diagnosis not present

## 2017-05-25 DIAGNOSIS — Z96652 Presence of left artificial knee joint: Secondary | ICD-10-CM | POA: Diagnosis not present

## 2017-05-25 DIAGNOSIS — K5790 Diverticulosis of intestine, part unspecified, without perforation or abscess without bleeding: Secondary | ICD-10-CM | POA: Diagnosis not present

## 2017-05-25 DIAGNOSIS — Z471 Aftercare following joint replacement surgery: Secondary | ICD-10-CM | POA: Diagnosis not present

## 2017-05-25 DIAGNOSIS — M1991 Primary osteoarthritis, unspecified site: Secondary | ICD-10-CM | POA: Diagnosis not present

## 2017-05-25 DIAGNOSIS — M48061 Spinal stenosis, lumbar region without neurogenic claudication: Secondary | ICD-10-CM | POA: Diagnosis not present

## 2017-05-25 DIAGNOSIS — R911 Solitary pulmonary nodule: Secondary | ICD-10-CM | POA: Diagnosis not present

## 2017-05-27 DIAGNOSIS — Z471 Aftercare following joint replacement surgery: Secondary | ICD-10-CM | POA: Diagnosis not present

## 2017-05-27 DIAGNOSIS — R911 Solitary pulmonary nodule: Secondary | ICD-10-CM | POA: Diagnosis not present

## 2017-05-27 DIAGNOSIS — K5790 Diverticulosis of intestine, part unspecified, without perforation or abscess without bleeding: Secondary | ICD-10-CM | POA: Diagnosis not present

## 2017-05-27 DIAGNOSIS — M48061 Spinal stenosis, lumbar region without neurogenic claudication: Secondary | ICD-10-CM | POA: Diagnosis not present

## 2017-05-27 DIAGNOSIS — I700 Atherosclerosis of aorta: Secondary | ICD-10-CM | POA: Diagnosis not present

## 2017-05-27 DIAGNOSIS — J449 Chronic obstructive pulmonary disease, unspecified: Secondary | ICD-10-CM | POA: Diagnosis not present

## 2017-05-27 DIAGNOSIS — M1991 Primary osteoarthritis, unspecified site: Secondary | ICD-10-CM | POA: Diagnosis not present

## 2017-05-27 DIAGNOSIS — Z96652 Presence of left artificial knee joint: Secondary | ICD-10-CM | POA: Diagnosis not present

## 2017-05-27 DIAGNOSIS — M4712 Other spondylosis with myelopathy, cervical region: Secondary | ICD-10-CM | POA: Diagnosis not present

## 2017-05-27 DIAGNOSIS — I2510 Atherosclerotic heart disease of native coronary artery without angina pectoris: Secondary | ICD-10-CM | POA: Diagnosis not present

## 2017-05-29 DIAGNOSIS — K5790 Diverticulosis of intestine, part unspecified, without perforation or abscess without bleeding: Secondary | ICD-10-CM | POA: Diagnosis not present

## 2017-05-29 DIAGNOSIS — Z471 Aftercare following joint replacement surgery: Secondary | ICD-10-CM | POA: Diagnosis not present

## 2017-05-29 DIAGNOSIS — Z96652 Presence of left artificial knee joint: Secondary | ICD-10-CM | POA: Diagnosis not present

## 2017-05-29 DIAGNOSIS — M48061 Spinal stenosis, lumbar region without neurogenic claudication: Secondary | ICD-10-CM | POA: Diagnosis not present

## 2017-05-29 DIAGNOSIS — J449 Chronic obstructive pulmonary disease, unspecified: Secondary | ICD-10-CM | POA: Diagnosis not present

## 2017-05-29 DIAGNOSIS — R911 Solitary pulmonary nodule: Secondary | ICD-10-CM | POA: Diagnosis not present

## 2017-05-29 DIAGNOSIS — I2510 Atherosclerotic heart disease of native coronary artery without angina pectoris: Secondary | ICD-10-CM | POA: Diagnosis not present

## 2017-05-29 DIAGNOSIS — M4712 Other spondylosis with myelopathy, cervical region: Secondary | ICD-10-CM | POA: Diagnosis not present

## 2017-05-29 DIAGNOSIS — M1991 Primary osteoarthritis, unspecified site: Secondary | ICD-10-CM | POA: Diagnosis not present

## 2017-05-29 DIAGNOSIS — I700 Atherosclerosis of aorta: Secondary | ICD-10-CM | POA: Diagnosis not present

## 2017-06-02 DIAGNOSIS — Z96652 Presence of left artificial knee joint: Secondary | ICD-10-CM | POA: Diagnosis not present

## 2017-06-02 DIAGNOSIS — J449 Chronic obstructive pulmonary disease, unspecified: Secondary | ICD-10-CM | POA: Diagnosis not present

## 2017-06-02 DIAGNOSIS — M48061 Spinal stenosis, lumbar region without neurogenic claudication: Secondary | ICD-10-CM | POA: Diagnosis not present

## 2017-06-02 DIAGNOSIS — I700 Atherosclerosis of aorta: Secondary | ICD-10-CM | POA: Diagnosis not present

## 2017-06-02 DIAGNOSIS — R911 Solitary pulmonary nodule: Secondary | ICD-10-CM | POA: Diagnosis not present

## 2017-06-02 DIAGNOSIS — I2510 Atherosclerotic heart disease of native coronary artery without angina pectoris: Secondary | ICD-10-CM | POA: Diagnosis not present

## 2017-06-02 DIAGNOSIS — M4712 Other spondylosis with myelopathy, cervical region: Secondary | ICD-10-CM | POA: Diagnosis not present

## 2017-06-02 DIAGNOSIS — K5790 Diverticulosis of intestine, part unspecified, without perforation or abscess without bleeding: Secondary | ICD-10-CM | POA: Diagnosis not present

## 2017-06-02 DIAGNOSIS — M1991 Primary osteoarthritis, unspecified site: Secondary | ICD-10-CM | POA: Diagnosis not present

## 2017-06-02 DIAGNOSIS — Z471 Aftercare following joint replacement surgery: Secondary | ICD-10-CM | POA: Diagnosis not present

## 2017-06-03 DIAGNOSIS — Z96652 Presence of left artificial knee joint: Secondary | ICD-10-CM | POA: Diagnosis not present

## 2017-06-03 DIAGNOSIS — M25562 Pain in left knee: Secondary | ICD-10-CM | POA: Diagnosis not present

## 2017-06-05 DIAGNOSIS — Z96652 Presence of left artificial knee joint: Secondary | ICD-10-CM | POA: Diagnosis not present

## 2017-06-10 DIAGNOSIS — Z96652 Presence of left artificial knee joint: Secondary | ICD-10-CM | POA: Diagnosis not present

## 2017-06-12 DIAGNOSIS — Z96652 Presence of left artificial knee joint: Secondary | ICD-10-CM | POA: Diagnosis not present

## 2017-06-15 DIAGNOSIS — Z96652 Presence of left artificial knee joint: Secondary | ICD-10-CM | POA: Diagnosis not present

## 2017-06-17 DIAGNOSIS — Z96652 Presence of left artificial knee joint: Secondary | ICD-10-CM | POA: Diagnosis not present

## 2017-06-19 DIAGNOSIS — Z96652 Presence of left artificial knee joint: Secondary | ICD-10-CM | POA: Diagnosis not present

## 2017-06-19 DIAGNOSIS — M25562 Pain in left knee: Secondary | ICD-10-CM | POA: Diagnosis not present

## 2017-06-29 DIAGNOSIS — Z96652 Presence of left artificial knee joint: Secondary | ICD-10-CM | POA: Diagnosis not present

## 2017-07-02 DIAGNOSIS — Z96652 Presence of left artificial knee joint: Secondary | ICD-10-CM | POA: Diagnosis not present

## 2017-07-02 DIAGNOSIS — M25562 Pain in left knee: Secondary | ICD-10-CM | POA: Diagnosis not present

## 2017-09-15 DIAGNOSIS — Z961 Presence of intraocular lens: Secondary | ICD-10-CM | POA: Diagnosis not present

## 2017-09-15 DIAGNOSIS — H43813 Vitreous degeneration, bilateral: Secondary | ICD-10-CM | POA: Diagnosis not present

## 2017-09-21 ENCOUNTER — Ambulatory Visit: Payer: Medicare HMO

## 2017-09-21 ENCOUNTER — Ambulatory Visit (INDEPENDENT_AMBULATORY_CARE_PROVIDER_SITE_OTHER): Payer: Medicare HMO

## 2017-09-21 VITALS — BP 152/90 | HR 76 | Temp 98.6°F | Ht 75.25 in | Wt 211.5 lb

## 2017-09-21 DIAGNOSIS — Z8546 Personal history of malignant neoplasm of prostate: Secondary | ICD-10-CM | POA: Diagnosis not present

## 2017-09-21 DIAGNOSIS — I700 Atherosclerosis of aorta: Secondary | ICD-10-CM

## 2017-09-21 DIAGNOSIS — Z0000 Encounter for general adult medical examination without abnormal findings: Secondary | ICD-10-CM

## 2017-09-21 LAB — BASIC METABOLIC PANEL
BUN: 19 mg/dL (ref 6–23)
CO2: 29 meq/L (ref 19–32)
CREATININE: 0.84 mg/dL (ref 0.40–1.50)
Calcium: 9.6 mg/dL (ref 8.4–10.5)
Chloride: 102 mEq/L (ref 96–112)
GFR: 94.07 mL/min (ref >60.00)
Glucose, Bld: 91 mg/dL (ref 70–99)
POTASSIUM: 4.5 meq/L (ref 3.5–5.1)
Sodium: 139 mEq/L (ref 135–145)

## 2017-09-21 LAB — LIPID PANEL
CHOLESTEROL: 154 mg/dL (ref 0–200)
HDL: 51.3 mg/dL (ref >39.00)
LDL Cholesterol: 85 mg/dL (ref 0–99)
NONHDL: 102.85
Total CHOL/HDL Ratio: 3
Triglycerides: 91 mg/dL (ref 0.0–149.0)
VLDL: 18.2 mg/dL (ref 0.0–40.0)

## 2017-09-21 LAB — PSA: PSA: 0.01 ng/mL — ABNORMAL LOW (ref 0.10–4.00)

## 2017-09-21 NOTE — Progress Notes (Signed)
PCP notes:   Health maintenance:  No gaps identified.  Abnormal screenings:   None  Patient concerns:   Left knee pain status post TKR. Pain scale: 3/10.  Nurse concerns:  None  Next PCP appt:   09/25/17 @ 0730

## 2017-09-21 NOTE — Patient Instructions (Signed)
Mr. Carlos Olson , Thank you for taking time to come for your Medicare Wellness Visit. I appreciate your ongoing commitment to your health goals. Please review the following plan we discussed and let me know if I can assist you in the future.   These are the goals we discussed: Goals    . Increase physical activity     Starting 09/21/2017, I will continue to do stretching exercises for 30 minutes daily.        This is a list of the screening recommended for you and due dates:  Health Maintenance  Topic Date Due  . DTaP/Tdap/Td vaccine (1 - Tdap) 04/20/2019*  . Tetanus Vaccine  04/20/2019  . Flu Shot  Completed  . Pneumonia vaccines  Completed  *Topic was postponed. The date shown is not the original due date.   Preventive Care for Adults  A healthy lifestyle and preventive care can promote health and wellness. Preventive health guidelines for adults include the following key practices.  . A routine yearly physical is a good way to check with your health care provider about your health and preventive screening. It is a chance to share any concerns and updates on your health and to receive a thorough exam.  . Visit your dentist for a routine exam and preventive care every 6 months. Brush your teeth twice a day and floss once a day. Good oral hygiene prevents tooth decay and gum disease.  . The frequency of eye exams is based on your age, health, family medical history, use  of contact lenses, and other factors. Follow your health care provider's recommendations for frequency of eye exams.  . Eat a healthy diet. Foods like vegetables, fruits, whole grains, low-fat dairy products, and lean protein foods contain the nutrients you need without too many calories. Decrease your intake of foods high in solid fats, added sugars, and salt. Eat the right amount of calories for you. Get information about a proper diet from your health care provider, if necessary.  . Regular physical exercise is one of  the most important things you can do for your health. Most adults should get at least 150 minutes of moderate-intensity exercise (any activity that increases your heart rate and causes you to sweat) each week. In addition, most adults need muscle-strengthening exercises on 2 or more days a week.  Silver Sneakers may be a benefit available to you. To determine eligibility, you may visit the website: www.silversneakers.com or contact program at 831-255-6033 Mon-Fri between 8AM-8PM.   . Maintain a healthy weight. The body mass index (BMI) is a screening tool to identify possible weight problems. It provides an estimate of body fat based on height and weight. Your health care provider can find your BMI and can help you achieve or maintain a healthy weight.   For adults 20 years and older: ? A BMI below 18.5 is considered underweight. ? A BMI of 18.5 to 24.9 is normal. ? A BMI of 25 to 29.9 is considered overweight. ? A BMI of 30 and above is considered obese.   . Maintain normal blood lipids and cholesterol levels by exercising and minimizing your intake of saturated fat. Eat a balanced diet with plenty of fruit and vegetables. Blood tests for lipids and cholesterol should begin at age 23 and be repeated every 5 years. If your lipid or cholesterol levels are high, you are over 50, or you are at high risk for heart disease, you may need your cholesterol levels checked more  frequently. Ongoing high lipid and cholesterol levels should be treated with medicines if diet and exercise are not working.  . If you smoke, find out from your health care provider how to quit. If you do not use tobacco, please do not start.  . If you choose to drink alcohol, please do not consume more than 2 drinks per day. One drink is considered to be 12 ounces (355 mL) of beer, 5 ounces (148 mL) of wine, or 1.5 ounces (44 mL) of liquor.  . If you are 52-45 years old, ask your health care provider if you should take aspirin to  prevent strokes.  . Use sunscreen. Apply sunscreen liberally and repeatedly throughout the day. You should seek shade when your shadow is shorter than you. Protect yourself by wearing long sleeves, pants, a wide-brimmed hat, and sunglasses year round, whenever you are outdoors.  . Once a month, do a whole body skin exam, using a mirror to look at the skin on your back. Tell your health care provider of new moles, moles that have irregular borders, moles that are larger than a pencil eraser, or moles that have changed in shape or color.

## 2017-09-21 NOTE — Progress Notes (Signed)
Subjective:   Carlos Olson is a 77 y.o. male who presents for Medicare Annual/Subsequent preventive examination.  Review of Systems:  N/A Cardiac Risk Factors include: advanced age (>31men, >28 women);male gender;dyslipidemia     Objective:    Vitals: BP (!) 152/90 (BP Location: Right Arm, Patient Position: Sitting, Cuff Size: Normal)   Pulse 76   Temp 98.6 F (37 C) (Oral)   Ht 6' 3.25" (1.911 m) Comment: shoes  Wt 211 lb 8 oz (95.9 kg)   SpO2 90%   BMI 26.26 kg/m   Body mass index is 26.26 kg/m.  Advanced Directives 09/21/2017 05/20/2017 05/20/2017 05/07/2017 09/18/2016 09/07/2015 11/22/2013  Does Patient Have a Medical Advance Directive? Yes Yes Yes Yes Yes Yes Patient has advance directive, copy not in chart  Type of Advance Directive Living will;Healthcare Power of [place];Living will [place];Living will Healthcare Power of [place];Living will [place];Living will -  Does patient want to make changes to medical advance directive? - No - Patient declined No - Patient declined - - No - Patient declined -  Copy of [place] in Chart? No - copy requested No - copy requested No - copy requested (No Data) Yes Yes -  Pre-existing out of facility DNR order (yellow form or pink MOST form) - - - - - - -    Tobacco Social History   Tobacco Use  Smoking Status Former Smoker  . Packs/day: 2.00  . Years: 52.00  . Pack years: 104.00  . Types: Cigarettes  . Last attempt to quit: 05/15/2007  . Years since quitting: 10.3  Smokeless Tobacco Never Used  Tobacco Comment   quit 06/16/2007/encouraged to remain smoke free     Counseling given: No Comment: quit 06/16/2007/encouraged to remain smoke free   Clinical Intake:  Pre-visit preparation completed: Yes  Pain Score: 3      Nutritional Status: BMI 25 -29 Overweight Nutritional Risks: None Diabetes:  No  How often do you need to have someone help you when you read instructions, pamphlets, or other written materials from your doctor or pharmacy?: 1 - Never What is the last grade level you completed in school?: Master degree  Interpreter Needed?: No  Comments: pt lives with spouse Information entered by :: LPinson, LPN  Past Medical History:  Diagnosis Date  . Atherosclerosis of aortic arch ([place]) 09/25/2015   By CT scan   . CAD (coronary artery disease) 09/25/2015   Mild 3v by CT scan   . Cancer [facility])    prostate  . Colon polyps   . COPD (chronic obstructive pulmonary disease) ([place])   . Diverticulosis of colon   . Femoral bruit    bilateral, found by Dr. Jefm Bryant  . Heart murmur    child  . History of prostate cancer   . IBS (irritable bowel syndrome)    improved after retirement  . 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 Pelvic fracture ([place])   . Pneumonia    child  . Pulmonary nodules 09/25/2015   On screening CT scan, rpt 1 yr   . Shortness of breath    doe   Past Surgical History:  Procedure Laterality Date  . ANTERIOR CERVICAL DECOMP/DISCECTOMY FUSION  07/29/2011   Procedure: ANTERIOR CERVICAL DECOMPRESSION/DISCECTOMY FUSION 1 LEVEL;  Surgeon: Jessy Oto, MD;  Location: [facility];  Service: Orthopedics;  Laterality: N/A;  Anterior Cervical Discectomy and Fusion C3-4  . CATARACT EXTRACTION W/PHACO Right 07/10/2015   Procedure: CATARACT EXTRACTION PHACO AND INTRAOCULAR LENS PLACEMENT (IOC);  Surgeon: Birder Robson, MD;  Location: [facility];  Service: Ophthalmology;  Laterality: Right;  [place] 00:58   . CATARACT EXTRACTION W/PHACO Left 07/24/2015   Procedure: CATARACT EXTRACTION PHACO AND INTRAOCULAR LENS PLACEMENT (IOC);  Surgeon: Birder Robson, MD;  Location: [facility];  Service: Ophthalmology;  Laterality: Left;  [place] 00:38   . COLONOSCOPY  12/2007   [place], diverticulosis and polyp, rpt 5 yrs  . dental implants    .  FORAMINOTOMY 2 LEVEL Left 10/2013   C4/5, C5/6 ([place])  . HERNIA REPAIR     right 05/1999, left 09/04/05  . JOINT REPLACEMENT Right    TKR  . KNEE ARTHROPLASTY Left 05/20/2017   Procedure: COMPUTER ASSISTED TOTAL KNEE ARTHROPLASTY;  Surgeon: Dereck Leep, MD;  Location: [facility];  Service: Orthopedics;  Laterality: Left;  . KNEE ARTHROSCOPY  09/1997   left  . POSTERIOR CERVICAL FUSION/FORAMINOTOMY N/A 11/22/2013   Procedure: LEFT C4-5 AND C5-6 FORAMINOTOMY;  Surgeon: Jessy Oto, MD;  Location: [facility];  Service: Orthopedics;  Laterality: N/A;  . PROSTATECTOMY  08/1990  . 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 TONSILLECTOMY     Family History  Problem Relation Age of Onset  . Hypertension Mother   . Cancer Father        bone, prostate  . Diabetes Sister        x 40 years  . Cancer Brother        prostate   Social History   Socioeconomic History  . Marital status: Married    Spouse name: Not on file  . Number of children: 2  . Years of education: Not on file  . Highest education level: Not on file  Occupational History  . Occupation: Medical illustrator, retired  Scientific laboratory technician  . Financial resource strain: Not on file  . Food insecurity:    Worry: Not on file    Inability: Not on file  . Transportation needs:    Medical: Not on file    Non-medical: Not on file  Tobacco Use  . Smoking status: Former Smoker    Packs/day: 2.00    Years: 52.00    Pack years: 104.00    Types: Cigarettes    Last attempt to quit: 05/15/2007    Years since quitting: 10.3  . Smokeless tobacco: Never Used  . Tobacco comment: quit 06/16/2007/encouraged to remain smoke free  Substance and Sexual Activity  . Alcohol use: Yes    Alcohol/week: 8.4 oz    Types: 14 Standard drinks or equivalent per week    Comment: 2 drinks a day  . Drug use: No  . Sexual activity: Never  Lifestyle  . Physical activity:    Days per week: Not on file    Minutes per session: Not on file  . Stress: Not on file   Relationships  . Social connections:    Talks on phone: Not on file    Gets together: Not on file    Attends religious service: Not on file    Active member of club or organization: Not on file    Attends meetings of clubs or organizations: Not on file    Relationship status: Not on file  Other Topics Concern  . Not on file  Social History Narrative   Prefers I call him "[name]"   Lives with wife.  Grown children   Occupation: retired, was in Insurance underwriter business   Activity: currently limited 2/2 knee pain   Diet: some water, vegetables daily    Outpatient Encounter Medications as of 09/21/2017  Medication Sig  . acetaminophen (TYLENOL) 500 MG tablet Take 1,000 mg by mouth 3 (three) times daily as needed for moderate pain or headache.   . fluticasone furoate-vilanterol (BREO ELLIPTA) 100-25 MCG/INH AEPB Inhale 1 puff into the lungs daily. (Patient taking differently: Inhale 1 puff into the lungs daily as needed (shortness of breath). )  . [DISCONTINUED] enoxaparin (LOVENOX) 40 MG/0.4ML injection Inject 0.4 mLs (40 mg total) into the skin daily.  . [DISCONTINUED] Menthol-Methyl Salicylate (MUSCLE RUB) 10-15 % CREA Apply 1 application topically as needed for muscle pain.  . [DISCONTINUED] oxyCODONE (OXY IR/ROXICODONE) 5 MG immediate release tablet Take 1-2 tablets (5-10 mg total) by mouth every 4 (four) hours as needed for severe pain.   No facility-administered encounter medications on file as of 09/21/2017.     Activities of Daily Living In your present state of health, do you have any difficulty performing the following activities: 09/21/2017 05/22/2017  Hearing? Y N  Vision? N N  Difficulty concentrating or making decisions? N N  Walking or climbing stairs? N N  Dressing or bathing? N N  Doing errands, shopping? N N  Preparing Food and eating ? N -  Using the Toilet? N -  In the past six months, have you accidently leaked urine? N -  Do you have problems with loss of bowel  control? N -  Managing your Medications? N -  Managing your Finances? N -  Housekeeping or managing your Housekeeping? N -  Some recent data might be hidden    Patient Care Team: Ria Bush, MD as PCP - General (Family Medicine) Trula Slade, DPM as Consulting Physician (Podiatry) Birder Robson, MD as Referring Physician (Ophthalmology)   Assessment:   This is a routine wellness examination for Conor.  Hearing Screening Comments: Bilateral hearing aids Vision Screening Comments: Vision exam in March 2019   Exercise Activities and Dietary recommendations Current Exercise Habits: The patient does not participate in regular exercise at present, Exercise limited by: orthopedic condition(s)  Goals    . Increase physical activity     Starting 09/21/2017, I will continue to do stretching exercises for 30 minutes daily.        Fall Risk Fall Risk  09/21/2017 09/18/2016 09/07/2015 09/08/2014 06/24/2013  Falls in the past year? No No No No Yes  Number falls in past yr: - - - - 1  Injury with Fall? - - - - No  Comment - - - - -  Risk Factor Category  - - - - -  Comment - - - - -  Risk for fall due to : - - - - -    Depression Screen PHQ 2/9 Scores 09/21/2017 09/18/2016 09/07/2015 09/08/2014  PHQ - 2 Score 0 0 0 0  PHQ- 9 Score 0 - - -    Cognitive Function MMSE - Mini Mental State Exam 09/21/2017 09/18/2016 09/07/2015  Orientation to time 5 5 5   Orientation to Place 5 5 5   Registration 3 3 3   Attention/ Calculation 0 0 5  Recall 3 3 3   Language- name 2 objects 0 0 0  Language- repeat 1 1 1   Language- follow 3 step command 3 3 3   Language- read & follow direction 0 0 1  Write a sentence 0 0  0  Copy design 0 0 0  Total score 20 20 26         Results for orders placed or performed during the hospital encounter of 05/07/17  Surgical pcr screen     Status: None   Collection Time: 05/07/17  9:49 AM  Result Value Ref Range Status   MRSA, PCR NEGATIVE NEGATIVE Final    Staphylococcus aureus NEGATIVE NEGATIVE Final    Comment: (NOTE) The Xpert SA Assay (FDA approved for NASAL specimens in patients 87 years of age and older), is one component of a comprehensive surveillance program. It is not intended to diagnose infection nor to guide or monitor treatment.   Urine culture     Status: None   Collection Time: 05/07/17  9:49 AM  Result Value Ref Range Status   Specimen Description URINE, RANDOM  Final   Special Requests Normal  Final   Culture   Final    NO GROWTH Performed at [facility], [address], [place], [place] [address]    Report Status 05/08/2017 FINAL  Final     Immunization History  Administered Date(s) Administered  . Influenza Split 03/27/2011  . Influenza Whole 04/27/2001, 03/19/2009, 03/30/2013  . Influenza, High Dose Seasonal PF 04/21/2014, 04/11/2016  . Influenza-Unspecified 03/31/2015, 04/14/2017  . Pneumococcal Conjugate-13 09/08/2014  . Pneumococcal Polysaccharide-23 04/27/2001, 06/22/2012  . Td 08/28/1998, 04/19/2009  . Zoster 03/27/2011   Screening Tests Health Maintenance  Topic Date Due  . DTaP/Tdap/Td (1 - Tdap) 04/20/2019 (Originally 04/20/2009)  . TETANUS/TDAP  04/20/2019  . INFLUENZA VACCINE  Completed  . PNA vac Low Risk Adult  Completed      Plan:     I have personally reviewed, addressed, and noted the following in the patient's chart:  A. Medical and social history B. Use of alcohol, tobacco or illicit drugs  C. Current medications and supplements D. Functional ability and status E.  Nutritional status F.  Physical activity G. Advance directives H. List of other physicians I.  Hospitalizations, surgeries, and ER visits in previous 12 months J.  [place] to include hearing, vision, cognitive, depression L. Referrals and appointments - none  In addition, I have reviewed and discussed with patient certain preventive protocols, quality metrics, and best practice  recommendations. A written personalized care plan for preventive services as well as general preventive health recommendations were provided to patient.  See attached scanned questionnaire for additional information.   Signed,   Lindell Noe, MHA, BS, LPN Health Coach

## 2017-09-25 ENCOUNTER — Ambulatory Visit (INDEPENDENT_AMBULATORY_CARE_PROVIDER_SITE_OTHER): Payer: Medicare HMO | Admitting: Family Medicine

## 2017-09-25 ENCOUNTER — Encounter: Payer: Self-pay | Admitting: Family Medicine

## 2017-09-25 VITALS — BP 134/82 | HR 64 | Temp 97.9°F | Ht 75.0 in | Wt 213.0 lb

## 2017-09-25 DIAGNOSIS — Z8546 Personal history of malignant neoplasm of prostate: Secondary | ICD-10-CM | POA: Diagnosis not present

## 2017-09-25 DIAGNOSIS — J432 Centrilobular emphysema: Secondary | ICD-10-CM

## 2017-09-25 DIAGNOSIS — I2510 Atherosclerotic heart disease of native coronary artery without angina pectoris: Secondary | ICD-10-CM | POA: Diagnosis not present

## 2017-09-25 DIAGNOSIS — Z87891 Personal history of nicotine dependence: Secondary | ICD-10-CM

## 2017-09-25 DIAGNOSIS — Z0000 Encounter for general adult medical examination without abnormal findings: Secondary | ICD-10-CM

## 2017-09-25 MED ORDER — FLUTICASONE FUROATE-VILANTEROL 100-25 MCG/INH IN AEPB: 1.0000 | INHALATION_SPRAY | Freq: Every day | RESPIRATORY_TRACT | 6 refills | Status: DC

## 2017-09-25 NOTE — Assessment & Plan Note (Addendum)
Remains abstinent since 2008 Undergoing lung cancer screening with CT

## 2017-09-25 NOTE — Patient Instructions (Addendum)
You will be due for colon cancer screening 02/2018. Consider colonoscopy vs cologard.  If interested, check with pharmacy about new 2 shot shingles series (shingrix).  Return as needed or in 1 year.  Health Maintenance, Male A healthy lifestyle and preventive care is important for your health and wellness. Ask your health care provider about what schedule of regular examinations is right for you. What should I know about weight and diet? Eat a Healthy Diet  Eat plenty of vegetables, fruits, whole grains, low-fat dairy products, and lean protein.  Do not eat a lot of foods high in solid fats, added sugars, or salt.  Maintain a Healthy Weight Regular exercise can help you achieve or maintain a healthy weight. You should:  Do at least 150 minutes of exercise each week. The exercise should increase your heart rate and make you sweat (moderate-intensity exercise).  Do strength-training exercises at least twice a week.  Watch Your Levels of Cholesterol and Blood Lipids  Have your blood tested for lipids and cholesterol every 5 years starting at 78 years of age. If you are at high risk for heart disease, you should start having your blood tested when you are 78 years old. You may need to have your cholesterol levels checked more often if: ? Your lipid or cholesterol levels are high. ? You are older than 78 years of age. ? You are at high risk for heart disease.  What should I know about cancer screening? Many types of cancers can be detected early and may often be prevented. Lung Cancer  You should be screened every year for lung cancer if: ? You are a current smoker who has smoked for at least 30 years. ? You are a former smoker who has quit within the past 15 years.  Talk to your health care provider about your screening options, when you should start screening, and how often you should be screened.  Colorectal Cancer  Routine colorectal cancer screening usually begins at 78 years of  age and should be repeated every 5-10 years until you are 78 years old. You may need to be screened more often if early forms of precancerous polyps or small growths are found. Your health care provider may recommend screening at an earlier age if you have risk factors for colon cancer.  Your health care provider may recommend using home test kits to check for hidden blood in the stool.  A small camera at the end of a tube can be used to examine your colon (sigmoidoscopy or colonoscopy). This checks for the earliest forms of colorectal cancer.  Prostate and Testicular Cancer  Depending on your age and overall health, your health care provider may do certain tests to screen for prostate and testicular cancer.  Talk to your health care provider about any symptoms or concerns you have about testicular or prostate cancer.  Skin Cancer  Check your skin from head to toe regularly.  Tell your health care provider about any new moles or changes in moles, especially if: ? There is a change in a mole's size, shape, or color. ? You have a mole that is larger than a pencil eraser.  Always use sunscreen. Apply sunscreen liberally and repeat throughout the day.  Protect yourself by wearing long sleeves, pants, a wide-brimmed hat, and sunglasses when outside.  What should I know about heart disease, diabetes, and high blood pressure?  If you are 63-62 years of age, have your blood pressure checked every 3-5 years.  If you are 75 years of age or older, have your blood pressure checked every year. You should have your blood pressure measured twice-once when you are at a hospital or clinic, and once when you are not at a hospital or clinic. Record the average of the two measurements. To check your blood pressure when you are not at a hospital or clinic, you can use: ? An automated blood pressure machine at a pharmacy. ? A home blood pressure monitor.  Talk to your health care provider about your target  blood pressure.  If you are between 12-10 years old, ask your health care provider if you should take aspirin to prevent heart disease.  Have regular diabetes screenings by checking your fasting blood sugar level. ? If you are at a normal weight and have a low risk for diabetes, have this test once every three years after the age of 58. ? If you are overweight and have a high risk for diabetes, consider being tested at a younger age or more often.  A one-time screening for abdominal aortic aneurysm (AAA) by ultrasound is recommended for men aged 37-75 years who are current or former smokers. What should I know about preventing infection? Hepatitis B If you have a higher risk for hepatitis B, you should be screened for this virus. Talk with your health care provider to find out if you are at risk for hepatitis B infection. Hepatitis C Blood testing is recommended for:  Everyone born from [birth year] through 1966.  Anyone with known risk factors for hepatitis C.  Sexually Transmitted Diseases (STDs)  You should be screened each year for STDs including gonorrhea and chlamydia if: ? You are sexually active and are younger than 78 years of age. ? You are older than 78 years of age and your health care provider tells you that you are at risk for this type of infection. ? Your sexual activity has changed since you were last screened and you are at an increased risk for chlamydia or gonorrhea. Ask your health care provider if you are at risk.  Talk with your health care provider about whether you are at high risk of being infected with HIV. Your health care provider may recommend a prescription medicine to help prevent HIV infection.  What else can I do?  Schedule regular health, dental, and eye exams.  Stay current with your vaccines (immunizations).  Do not use any tobacco products, such as cigarettes, chewing tobacco, and e-cigarettes. If you need help quitting, ask your health care  provider.  Limit alcohol intake to no more than 2 drinks per day. One drink equals 12 ounces of beer, 5 ounces of wine, or 1 ounces of hard liquor.  Do not use street drugs.  Do not share needles.  Ask your health care provider for help if you need support or information about quitting drugs.  Tell your health care provider if you often feel depressed.  Tell your health care provider if you have ever been abused or do not feel safe at home. This information is not intended to replace advice given to you by your health care provider. Make sure you discuss any questions you have with your health care provider. Document Released: 12/13/2007 Document Revised: 02/13/2016 Document Reviewed: 03/20/2015 Elsevier Interactive Patient Education  [employer].

## 2017-09-25 NOTE — Progress Notes (Signed)
BP 134/82 (BP Location: Left Arm, Patient Position: Sitting, Cuff Size: Normal)   Pulse 64   Temp 97.9 F (36.6 C) (Oral)   Ht 6\' 3"  (1.905 m)   Wt 213 lb (96.6 kg)   SpO2 94%   BMI 26.62 kg/m    CC: CPE Subjective:    Patient ID: Carlos Olson, male    DOB: 20-Feb-1941, 77 y.o.   MRN: [id number]  HPI: Carlos Olson is a 77 y.o. male presenting on 09/25/2017 for Annual Exam (Pt 2.)   Saw Katha Cabal this week for medicare wellness visit. Note reviewed.   Moved last year closer to [place] [employer].   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 Slowed recovery process. Staying sore. Takes tylenol scheduled for this.  S/p R knee replacement 5 yrs ago Ongoing shoulder pain from presumed arthritis.  Upcoming LRCT scan. Uses breo PRN (about once weekly).  Intermittent URI sxs that comes and goes over last several months.   Preventative: Colon 02/2013 - tortuous colon, diverticulosis and polyp, rpt 5 yrs ([facility])  Prostate cancer - s/p prostatectomy 1990s  Lung cancer screening - 100 PY history. Quit 2008 with chantix. Screening CT chest 08/2015 - small R lung nodules.  Flu shot yearly  Td 2010  Pneumovax 2013, prevnar 2016 zostavax 2012  shingrix - discussed Received advanced directive and scanned into chart 08/2015. Carlos Olson then Carlos Olson are HCPOA.  Seat belt use discussed.  Sunscreen use discussed. No changing moles on skin.  Ex smoker - quit 2008.  Alcohol - 2 drinks/day.   "Carlos Olson" Lives with wife. Grown children.  Occupation: retired, was in Insurance underwriter business  Activity: home exercise routine dialy Diet: some water, vegetables daily   Relevant past medical, surgical, family and social history reviewed and updated as indicated. Interim medical history since our last visit reviewed. Allergies and medications reviewed and updated. Outpatient Medications Prior to Visit  Medication Sig Dispense Refill  . acetaminophen (TYLENOL) 500 MG tablet Take 1,000 mg by mouth 3 (three) times  daily as needed for moderate pain or headache.     . fluticasone furoate-vilanterol (BREO ELLIPTA) 100-25 MCG/INH AEPB Inhale 1 puff into the lungs daily. (Patient taking differently: Inhale 1 puff into the lungs daily as needed (shortness of breath). ) 28 each 6   No facility-administered medications prior to visit.      Per HPI unless specifically indicated in ROS section below Review of Systems  Constitutional: Positive for chills. Negative for activity change, appetite change, fatigue, fever and unexpected weight change.  HENT: Negative for hearing loss.   Eyes: Negative for visual disturbance.  Respiratory: Negative for cough, chest tightness, shortness of breath and wheezing.   Cardiovascular: Negative for chest pain, palpitations and leg swelling.  Gastrointestinal: Negative for abdominal distention, abdominal pain, blood in stool, constipation, diarrhea, nausea and vomiting.       Infrequent IBS flares  Genitourinary: Negative for difficulty urinating and hematuria.  Musculoskeletal: Negative for arthralgias, myalgias and neck pain.  Skin: Negative for rash.  Neurological: Negative for dizziness, seizures, syncope and headaches.  Hematological: Negative for adenopathy. Does not bruise/bleed easily.  Psychiatric/Behavioral: Negative for dysphoric mood. The patient is not nervous/anxious.        Objective:    BP 134/82 (BP Location: Left Arm, Patient Position: Sitting, Cuff Size: Normal)   Pulse 64   Temp 97.9 F (36.6 C) (Oral)   Ht 6\' 3"  (1.905 m)   Wt 213 lb (96.6 kg)   SpO2 94%  BMI 26.62 kg/m   Wt Readings from Last 3 Encounters:  09/25/17 213 lb (96.6 kg)  09/21/17 211 lb 8 oz (95.9 kg)  05/20/17 215 lb (97.5 kg)    Physical Exam  Constitutional: He is oriented to person, place, and time. He appears well-developed and well-nourished. No distress.  HENT:  Head: Normocephalic and atraumatic.  Right Ear: Hearing, tympanic membrane, external ear and ear canal  normal.  Left Ear: Hearing, tympanic membrane, external ear and ear canal normal.  Nose: Nose normal.  Mouth/Throat: Uvula is midline, oropharynx is clear and moist and mucous membranes are normal. No oropharyngeal exudate, posterior oropharyngeal edema or posterior oropharyngeal erythema.  Eyes: Pupils are equal, round, and reactive to light. Conjunctivae and EOM are normal. No scleral icterus.  Neck: Normal range of motion. Neck supple. No thyromegaly present.  Cardiovascular: Normal rate, regular rhythm, normal heart sounds and intact distal pulses.  No murmur heard. Pulses:      Radial pulses are 2+ on the right side, and 2+ on the left side.  Pulmonary/Chest: Effort normal and breath sounds normal. No respiratory distress. He has no wheezes. He has no rales.  Abdominal: Soft. Bowel sounds are normal. He exhibits no distension and no mass. There is no tenderness. There is no rebound and no guarding.  Musculoskeletal: Normal range of motion. He exhibits no edema.  Lymphadenopathy:    He has no cervical adenopathy.  Neurological: He is alert and oriented to person, place, and time.  CN grossly intact, station and gait intact  Skin: Skin is warm and dry. No rash noted.  Psychiatric: He has a normal mood and affect. His behavior is normal. Judgment and thought content normal.  Nursing note and vitals reviewed.  Results for orders placed or performed in visit on 09/21/17  PSA  Result Value Ref Range   PSA 0.01 (L) 0.10 - 4.00 ng/mL  Lipid Panel  Result Value Ref Range   Cholesterol 154 0 - 200 mg/dL   Triglycerides 91.0 0.0 - 149.0 mg/dL   HDL 51.30 >39.00 mg/dL   VLDL 18.2 0.0 - 40.0 mg/dL   LDL Cholesterol 85 0 - 99 mg/dL   Total CHOL/HDL Ratio 3    NonHDL 638.75   Basic Metabolic Panel  Result Value Ref Range   Sodium 139 135 - 145 mEq/L   Potassium 4.5 3.5 - 5.1 mEq/L   Chloride 102 96 - 112 mEq/L   CO2 29 19 - 32 mEq/L   Glucose, Bld 91 70 - 99 mg/dL   BUN 19 6 - 23 mg/dL    Creatinine, Ser 0.84 0.40 - 1.50 mg/dL   Calcium 9.6 8.4 - 10.5 mg/dL   GFR 94.07 >60.00 mL/min      Assessment & Plan:   Problem List Items Addressed This Visit    CAD (coronary artery disease)    Only by CT. Asxs. Not on aspirin or statin.       COPD (chronic obstructive pulmonary disease) ([place])    By CT. Doing well with rare breo.  Pollen/cold weather are triggers      Relevant Medications   fluticasone furoate-vilanterol (BREO ELLIPTA) 100-25 MCG/INH AEPB   Health maintenance examination - Primary    Preventative protocols reviewed and updated unless pt declined. Discussed healthy diet and lifestyle.       History of tobacco abuse    Remains abstinent since 2008 Undergoing lung cancer screening with CT      PROSTATE CANCER, HX OF  PSA stable.           Meds ordered this encounter  Medications  . fluticasone furoate-vilanterol (BREO ELLIPTA) 100-25 MCG/INH AEPB    Sig: Inhale 1 puff into the lungs daily.    Dispense:  28 each    Refill:  6   No orders of the defined types were placed in this encounter.   Follow up plan: Return in about 1 year (around 09/26/2018) for annual exam, prior fasting for blood work.  Ria Bush, MD

## 2017-09-25 NOTE — Assessment & Plan Note (Signed)
PSA stable

## 2017-09-25 NOTE — Assessment & Plan Note (Signed)
Only by CT. Asxs. Not on aspirin or statin.

## 2017-09-25 NOTE — Assessment & Plan Note (Signed)
By CT. Doing well with rare breo.  Pollen/cold weather are triggers

## 2017-09-25 NOTE — Assessment & Plan Note (Signed)
Preventative protocols reviewed and updated unless pt declined. Discussed healthy diet and lifestyle.  

## 2017-09-29 NOTE — Progress Notes (Signed)
I reviewed health advisor's note, was available for consultation, and agree with documentation and plan.  

## 2017-10-01 DIAGNOSIS — Z96652 Presence of left artificial knee joint: Secondary | ICD-10-CM | POA: Diagnosis not present

## 2017-10-12 ENCOUNTER — Ambulatory Visit: Payer: Medicare HMO

## 2017-10-13 ENCOUNTER — Ambulatory Visit (INDEPENDENT_AMBULATORY_CARE_PROVIDER_SITE_OTHER)
Admission: RE | Admit: 2017-10-13 | Discharge: 2017-10-13 | Disposition: A | Discharge status: Home / Self Care | Payer: Medicare HMO | Source: Ambulatory Visit | Attending: Acute Care | Admitting: Acute Care

## 2017-10-13 DIAGNOSIS — Z87891 Personal history of nicotine dependence: Secondary | ICD-10-CM | POA: Diagnosis not present

## 2017-10-20 ENCOUNTER — Telehealth: Payer: Self-pay | Admitting: Family Medicine

## 2017-10-20 ENCOUNTER — Telehealth: Payer: Self-pay | Admitting: Acute Care

## 2017-10-20 DIAGNOSIS — I7121 Aneurysm of the ascending aorta, without rupture: Secondary | ICD-10-CM

## 2017-10-20 DIAGNOSIS — I712 Thoracic aortic aneurysm, without rupture: Secondary | ICD-10-CM

## 2017-10-20 NOTE — Telephone Encounter (Signed)
I have called Dr. Lucretia Roers office with the results of the low dose CT. I have spoken with Michaele Offer, RN who will make sure Dr. Lucretia Roers is aware of the results of the scan  for   follow up management of the aortic aneurysm ( Imaging every 6 month , and thoracic surgery consult is recommended).

## 2017-10-20 NOTE — Telephone Encounter (Signed)
Rec'd call from Eric Form, NP., with [facility].  See note below, as was copied from result note on CT Chest of 10/13/17.  Voiced concern that pt. has new finding of 4.6 cm. Ascending Thoracic Aortic Aneurysm, and three vessel coronary artery disease; is not on a Statin, and may need closer BP monitoring/ management.   Scan was faxed for Dr. Danise Mina' review.  Offered that Dr. Danise Mina can call her, if any questions.  Advised will make Dr. Darnell Level. Aware of her update on patient.         Result Notes for CT CHEST LUNG CA SCREEN LOW DOSE W/O CM   Notes recorded by Magdalen Spatz, NP on 10/20/2017 at 1:56 PM EDT I have called Mr. Lowry with the results of his low-dose screening CT. I explained that his scan was read as a Lung RADS 2: nodules that are benign in appearance and behavior with a very low likelihood of becoming a clinically active cancer due to size or lack of growth. Recommendation per radiology is for a repeat LDCT in 12 months. I explained that as he is 77 years old, he has aged out of the program. We discussed that he could continue screening through his primary care doctor at the [facility] for a $299 out-of-pocket fee. He is interested in doing this. I will make sure his PCP is aware. Additionally I let him know that there was notation of three-vessel coronary artery atherosclerosis on his scan. He is currently not on statin therapy. There was also notation of a stable 4.6 cm ascending thoracic aortic aneurysm with recommendation of semiannual imaging by CTA or MRA and referral to thoracic surgery if had it has not he been obtained. I will call the patient's PCP and make sure that they are aware of this finding, and the recommendation for every 68-month imaging with thoracic surgery consult . Mr. Berniece Andreas had no further questions at completion of the call. He has my contact information in the event he has any further questions.

## 2017-10-26 DIAGNOSIS — I712 Thoracic aortic aneurysm, without rupture: Secondary | ICD-10-CM | POA: Insufficient documentation

## 2017-10-26 DIAGNOSIS — I7121 Aneurysm of the ascending aorta, without rupture: Secondary | ICD-10-CM | POA: Insufficient documentation

## 2017-10-26 NOTE — Addendum Note (Signed)
Addended by: Ria Bush on: 10/26/2017 01:50 PM   Modules accepted: Orders

## 2017-10-26 NOTE — Telephone Encounter (Signed)
Spoke with pt relaying message and instructions per Dr. G.  Pt verbalizes understanding. 

## 2017-10-26 NOTE — Telephone Encounter (Signed)
plz notify patient I received message from Carlos Olson regarding his new thoracic aortic aneurysm - we will need to monitor this every 6 months, and I have placed a referral to establish with thoracic surgeon to help monitor this. Keep an eye on blood pressure and let me know if readings staying >140/90. Let me know if any questions.

## 2017-10-27 NOTE — Telephone Encounter (Signed)
Called patient and gave the information for [facility] address and phone number. They will receive a call in next few days and be given an appointment.Referral sent to them through Epic.

## 2017-11-18 ENCOUNTER — Telehealth: Payer: Self-pay

## 2017-11-18 NOTE — Telephone Encounter (Signed)
Do you want me to set pt up for Cologuard?

## 2017-11-18 NOTE — Telephone Encounter (Signed)
Copied from [place] [id number]. Topic: Inquiry >> Nov 18, 2017 11:09 AM Pricilla Handler wrote: Reason for CRM: Patient called requesting the Cologuard test to be ordered. Patient states that he discussed this test with Dr. Darnell Level. Dr. Darnell Level advised the patient to call our office if he decided that he wants to try to Cologuard test.         Thank You!!!

## 2017-11-18 NOTE — Telephone Encounter (Signed)
Yes please - thank you!

## 2017-11-19 NOTE — Telephone Encounter (Addendum)
Faxed Cologuard form and pt's info to [employer].  Notified pt.

## 2017-11-24 DIAGNOSIS — M6752 Plica syndrome, left knee: Secondary | ICD-10-CM | POA: Diagnosis not present

## 2017-11-24 DIAGNOSIS — Z96652 Presence of left artificial knee joint: Secondary | ICD-10-CM | POA: Diagnosis not present

## 2017-11-26 LAB — COLOGUARD

## 2017-12-02 ENCOUNTER — Encounter: Payer: Medicare HMO | Admitting: Surgery

## 2017-12-23 ENCOUNTER — Other Ambulatory Visit: Payer: Self-pay

## 2017-12-23 ENCOUNTER — Encounter: Payer: Self-pay | Admitting: Surgery

## 2017-12-23 ENCOUNTER — Institutional Professional Consult (permissible substitution): Payer: Medicare HMO | Admitting: Surgery

## 2017-12-23 VITALS — BP 144/82 | HR 65 | Resp 16 | Ht 75.0 in | Wt 210.0 lb

## 2017-12-23 DIAGNOSIS — I7121 Aneurysm of the ascending aorta, without rupture: Secondary | ICD-10-CM

## 2017-12-23 DIAGNOSIS — I712 Thoracic aortic aneurysm, without rupture: Secondary | ICD-10-CM

## 2017-12-23 NOTE — Progress Notes (Signed)
PCP is Ria Bush, MD Referring Provider is Ria Bush, MD  Chief Complaint  Patient presents with  . Thoracic Aortic Aneurysm    Surgical eval, LO DOSE CANCER SCREENING CT Chest 10/13/17.Marland KitchenMarland KitchenNO ECHO    HPI:  The patient is a 77 year old gentleman with history of remote smoking and COPD as well as severe osteoarthritis status post bilateral knee replacement who is had a lung cancer screening CT done for the past 3 years and his most recent scan on 10/13/2017 showed a 4.6 cm fusiform ascending aortic aneurysm.  There is no comment about this on the previous lung cancer screening CT's.  He says that he has been following his blood pressure at home since the diagnosis was made and he brought a chart with his blood pressures on it today.  Most of them appear to be under fairly good control with a maximum systolic blood pressure in the 130s.  There is no family history of aortic aneurysm or dissection.  There is no family history of connective tissue disorder.  Past Medical History:  Diagnosis Date  . Atherosclerosis of aortic arch ([place]) 09/25/2015   By CT scan   . CAD (coronary artery disease) 09/25/2015   Mild 3v by CT scan   . Cancer [facility])    prostate  . Colon polyps   . COPD (chronic obstructive pulmonary disease) ([place])   . Diverticulosis of colon   . Femoral bruit    bilateral, found by Dr. Jefm Bryant  . Heart murmur    child  . History of prostate cancer   . IBS (irritable bowel syndrome)    improved after retirement  . 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 Pelvic fracture ([place])   . Pneumonia    child  . Pulmonary nodules 09/25/2015   On screening CT scan, rpt 1 yr   . Shortness of breath    doe    Past Surgical History:  Procedure Laterality Date  . ANTERIOR CERVICAL DECOMP/DISCECTOMY FUSION  07/29/2011   Procedure: ANTERIOR CERVICAL DECOMPRESSION/DISCECTOMY FUSION 1 LEVEL;  Surgeon: Jessy Oto, MD;  Location:  [place];  Service: Orthopedics;  Laterality: N/A;  Anterior Cervical Discectomy and Fusion C3-4  . CATARACT EXTRACTION W/PHACO Right 07/10/2015   Procedure: CATARACT EXTRACTION PHACO AND INTRAOCULAR LENS PLACEMENT (IOC);  Surgeon: Birder Robson, MD;  Location: [facility];  Service: Ophthalmology;  Laterality: Right;  [place] 00:58   . CATARACT EXTRACTION W/PHACO Left 07/24/2015   Procedure: CATARACT EXTRACTION PHACO AND INTRAOCULAR LENS PLACEMENT (IOC);  Surgeon: Birder Robson, MD;  Location: [facility];  Service: Ophthalmology;  Laterality: Left;  [place] 00:38   . COLONOSCOPY  12/2007   Medhoff, diverticulosis and polyp, rpt 5 yrs  . dental implants    . FORAMINOTOMY 2 LEVEL Left 10/2013   C4/5, C5/6 ([place])  . HERNIA REPAIR     right 05/1999, left 09/04/05  . JOINT REPLACEMENT Right    TKR  . KNEE ARTHROPLASTY Left 05/20/2017   Procedure: COMPUTER ASSISTED TOTAL KNEE ARTHROPLASTY;  Surgeon: Dereck Leep, MD;  Location: [facility];  Service: Orthopedics;  Laterality: Left;  . KNEE ARTHROSCOPY  09/1997   left  . POSTERIOR CERVICAL FUSION/FORAMINOTOMY N/A 11/22/2013   Procedure: LEFT C4-5 AND C5-6 FORAMINOTOMY;  Surgeon: Jessy Oto, MD;  Location: [place];  Service: Orthopedics;  Laterality: N/A;  . PROSTATECTOMY  08/1990  . 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 TONSILLECTOMY  Family History  Problem Relation Age of Onset  . Hypertension Mother   . Cancer Father        bone, prostate  . Diabetes Sister        x 40 years  . Cancer Brother        prostate    Social History Social History   Tobacco Use  . Smoking status: Former Smoker    Packs/day: 2.00    Years: 52.00    Pack years: 104.00    Types: Cigarettes    Last attempt to quit: 05/15/2007    Years since quitting: 10.6  . Smokeless tobacco: Never Used  . Tobacco comment: quit 06/16/2007/encouraged to remain smoke free  Substance Use Topics  . Alcohol use: Yes    Alcohol/week: 8.4 oz    Types: 14 Standard drinks or  equivalent per week    Comment: 2 drinks a day  . Drug use: No    Current Outpatient Medications  Medication Sig Dispense Refill  . acetaminophen (TYLENOL) 500 MG tablet Take 1,000 mg by mouth 3 (three) times daily as needed for moderate pain or headache.     . fluticasone furoate-vilanterol (BREO ELLIPTA) 100-25 MCG/INH AEPB Inhale 1 puff into the lungs daily. 28 each 6   No current facility-administered medications for this visit.     Allergies  Allergen Reactions  . Sulfonamide Derivatives Itching  . Sulfasalazine Itching    Review of Systems  Constitutional: Negative for activity change and fatigue.  HENT: Negative.        Dentures  Eyes: Negative.   Respiratory: Positive for shortness of breath.        With exertion  Cardiovascular: Negative for chest pain and leg swelling.  Gastrointestinal: Negative.   Endocrine: Negative.   Genitourinary: Negative.   Musculoskeletal: Positive for arthralgias.  Allergic/Immunologic: Negative.   Neurological: Negative.   Hematological: Negative.   Psychiatric/Behavioral: Negative.     BP (!) 144/82 (BP Location: Right Arm, Patient Position: Sitting, Cuff Size: Large)   Pulse 65   Resp 16   Ht 6\' 3"  (1.905 m)   Wt 210 lb (95.3 kg)   SpO2 96% Comment: RA  BMI 26.25 kg/m  Physical Exam  Constitutional: He is oriented to person, place, and time. He appears well-developed and well-nourished. No distress.  HENT:  Head: Normocephalic and atraumatic.  Eyes: Pupils are equal, round, and reactive to light. EOM are normal.  Neck: No JVD present.  Cardiovascular: Normal rate, regular rhythm, normal heart sounds and intact distal pulses.  No murmur heard. Pulmonary/Chest: Effort normal and breath sounds normal.  Musculoskeletal: He exhibits no edema.  Scars from bilateral knee replacement.  Neurological: He is alert and oriented to person, place, and time.     Diagnostic Tests:  CLINICAL DATA:  77 year old asymptomatic male  former smoker with 104 pack-year smoking history.  EXAM: CT CHEST WITHOUT CONTRAST LOW-DOSE FOR LUNG CANCER SCREENING  TECHNIQUE: Multidetector CT imaging of the chest was performed following the standard protocol without IV contrast.  COMPARISON:  10/09/2016 screening chest CT.  FINDINGS: Cardiovascular: Normal heart size. No significant pericardial fluid/thickening. Three-vessel coronary atherosclerosis. Atherosclerotic thoracic aorta with stable 4.6 cm ascending thoracic aortic aneurysm. No caliber pulmonary arteries.  Mediastinum/Nodes: No discrete thyroid nodules. Unremarkable esophagus. No pathologically enlarged axillary, mediastinal or gross hilar lymph nodes, noting limited sensitivity for the detection of hilar adenopathy on this noncontrast study.  Lungs/Pleura: No pneumothorax. No pleural effusion. Severe centrilobular emphysema with mild diffuse bronchial  wall thickening. No acute consolidative airspace disease or lung masses. No significant growth of any of the previously visualized scattered small pulmonary nodules. No new significant pulmonary nodules.  Upper abdomen: No acute abnormality.  Musculoskeletal: No aggressive appearing focal osseous lesions. Mild thoracic spondylosis.  IMPRESSION: 1. Lung-RADS 2, benign appearance or behavior. Continue annual screening with low-dose chest CT without contrast in 12 months. 2. Three vessel coronary atherosclerosis. 3. Stable 4.6 cm ascending thoracic aortic aneurysm. Recommend semi-annual imaging followup by CTA or MRA and referral to cardiothoracic surgery if not already obtained. This recommendation follows 2010 ACCF/AHA/AATS/ACR/ASA/SCA/SCAI/SIR/STS/SVM Guidelines for the Diagnosis and Management of Patients With Thoracic Aortic Disease. Circulation. 2010; 121: S063-K160.  Aortic Atherosclerosis (ICD10-I70.0) and Emphysema (ICD10-J43.9).   Electronically Signed   By: Ilona Sorrel M.D.   On:  10/13/2017 15:03  Impression:  This 77 year old gentleman has a 4.6 cm fusiform ascending aortic aneurysm.  I have personally reviewed his CTA of the chest from 2010 as well as his lung cancer screening CT from 2017 and 2018.  He had a 4.3 cm fusiform ascending aortic aneurysm on his CTA of the chest in 2010 although it was not commented on by radiology.  The aortic aneurysm measured 4.4 to 4.5 cm on the lung cancer screening CT from 2017 and 2018.  It was not noted by radiology on either scan.  There appears to be a small increase in size from 4.3 cm in 2010 to 4.6 cm now.  This is still well below the surgical threshold of 5.5 cm.  He has no family history of aneurysm and this is not root predominant.  His blood pressure is under good control.  I think it is best to continue following this and I would do another scan in 1 year.  I reviewed all the CT images with the patient has wife and answered all the questions.  They are in agreement with the follow-up plan.  Plan:  I will see him back in 1 year with a CTA of the chest for follow-up of his ascending aortic aneurysm.   I spent 30 minutes performing this consultation and > 50% of this time was spent face to face counseling and coordinating the care of this patient's ascending aortic aneurysm.   Gaye Pollack, MD [facility] [phone]

## 2018-01-20 DIAGNOSIS — Z1211 Encounter for screening for malignant neoplasm of colon: Secondary | ICD-10-CM | POA: Diagnosis not present

## 2018-01-20 DIAGNOSIS — Z1212 Encounter for screening for malignant neoplasm of rectum: Secondary | ICD-10-CM | POA: Diagnosis not present

## 2018-01-20 LAB — COLOGUARD: COLOGUARD: NEGATIVE

## 2018-02-01 ENCOUNTER — Telehealth: Payer: Self-pay

## 2018-02-01 ENCOUNTER — Encounter: Payer: Self-pay | Admitting: Family Medicine

## 2018-02-01 NOTE — Telephone Encounter (Signed)
Received faxed negative Cologuard results for pt.  Attempted to contact pt.  No answer. No vm.  Need to notify pt his Cologuard results were negative.

## 2018-03-24 ENCOUNTER — Telehealth: Payer: Self-pay | Admitting: *Deleted

## 2018-03-24 NOTE — Telephone Encounter (Signed)
Copied from [place] [id number]. Topic: Referral - Request >> Mar 24, 2018 10:08 AM Sheran Luz wrote: Pt scheduled an appointment for 9/27 for shortness of breath (pt is out of town and requested Friday)---but pt is also wanting to know if Dr. Danise Mina could refer him to pulmonary care/discuss on Friday appt. Pt states he would like to see who Dr. Danise Mina recommends.

## 2018-03-26 ENCOUNTER — Encounter: Payer: Self-pay | Admitting: Family Medicine

## 2018-03-26 ENCOUNTER — Ambulatory Visit (INDEPENDENT_AMBULATORY_CARE_PROVIDER_SITE_OTHER): Payer: Medicare HMO | Admitting: Family Medicine

## 2018-03-26 ENCOUNTER — Ambulatory Visit (INDEPENDENT_AMBULATORY_CARE_PROVIDER_SITE_OTHER)
Admission: RE | Admit: 2018-03-26 | Discharge: 2018-03-26 | Disposition: A | Discharge status: Home / Self Care | Payer: Medicare HMO | Source: Ambulatory Visit | Attending: Family Medicine | Admitting: Family Medicine

## 2018-03-26 VITALS — BP 132/70 | HR 82 | Temp 98.0°F | Ht 75.0 in | Wt 211.2 lb

## 2018-03-26 DIAGNOSIS — J441 Chronic obstructive pulmonary disease with (acute) exacerbation: Secondary | ICD-10-CM | POA: Diagnosis not present

## 2018-03-26 DIAGNOSIS — J432 Centrilobular emphysema: Secondary | ICD-10-CM

## 2018-03-26 DIAGNOSIS — J449 Chronic obstructive pulmonary disease, unspecified: Secondary | ICD-10-CM | POA: Diagnosis not present

## 2018-03-26 MED ORDER — DOXYCYCLINE HYCLATE 100 MG PO TABS: 100.0000 mg | ORAL_TABLET | Freq: Two times a day (BID) | ORAL | 0 refills | Status: DC

## 2018-03-26 MED ORDER — ALBUTEROL SULFATE HFA 108 (90 BASE) MCG/ACT IN AERS: 2.0000 | INHALATION_SPRAY | Freq: Four times a day (QID) | RESPIRATORY_TRACT | 3 refills | Status: DC | PRN

## 2018-03-26 MED ORDER — PREDNISONE 20 MG PO TABS: ORAL_TABLET | ORAL | 0 refills | Status: DC

## 2018-03-26 NOTE — Progress Notes (Signed)
BP 132/70 (BP Location: Left Arm, Patient Position: Sitting, Cuff Size: Normal)   Pulse 82   Temp 98 F (36.7 C) (Oral)   Ht 6\' 3"  (1.905 m)   Wt 211 lb 4 oz (95.8 kg)   SpO2 93%   BMI 26.40 kg/m    CC: dyspnea Subjective:    Patient ID: Carlos Olson, male    DOB: October 05, 1940, 77 y.o.   MRN: [id number]  HPI: Carlos Olson is a 77 y.o. male presenting on 03/26/2018 for Shortness of Breath (Had SOB on 03/23/18 while in [place], [place].  Pt was given a dose of rescue inhaler and Breo by a nurse that was with them. Eventually, sxs got better but not well. Pt provided a detailed account or events and tx, written by the nurse (copy made). Had fever of 100 this morning, took Tyleonl. Pt accompanied by his wife. )   Sudden episode 9/24 of difficulty breathing on trip to [facility]. He was with RN friend who wrote down account - O2 sat 88-90%, RR 30. Treated with breo inhaler, then 4 puffs of ventolin inhaler with relief. Had another course of breo and ventolin the nex day x2.   Describes episode of trouble catching his breath. Since then having progressive productive cough of increased sputum production. Tmax 100. + feverish, chills. Some nausea with this. Never wheezing. No head congestion, ST, ear pain, headache.  Denies chest pain pressure/tightness with this.   Known CAD by CT Known COPD by CT "severe". Has not had spirometry or cardiac evaluation.  Quit smoking 2008.   Low dose chest CT 09/2017: IMPRESSION: 1. Lung-RADS 2, benign appearance or behavior. Continue annual screening with low-dose chest CT without contrast in 12 months. 2. Three vessel coronary atherosclerosis. 3. Stable 4.6 cm ascending thoracic aortic aneurysm. Recommend semi-annual imaging followup by CTA or MRA and referral to cardiothoracic surgery if not already obtained. This recommendation follows 2010 ACCF/AHA/AATS/ACR/ASA/SCA/ SCAI/SIR/STS/SVM Guidelines for the Diagnosis and Management of Patients With  Thoracic Aortic Disease. Circulation. 2010; 121: Q119-E174. Aortic Atherosclerosis (ICD10-I70.0) and Emphysema (ICD10-J43.9). Electronically Signed   By: Ilona Sorrel M.D.   On: 10/13/2017 15:03  Relevant past medical, surgical, family and social history reviewed and updated as indicated. Interim medical history since our last visit reviewed. Allergies and medications reviewed and updated. Outpatient Medications Prior to Visit  Medication Sig Dispense Refill  . acetaminophen (TYLENOL) 500 MG tablet Take 1,000 mg by mouth 3 (three) times daily as needed for moderate pain or headache.     . fluticasone furoate-vilanterol (BREO ELLIPTA) 100-25 MCG/INH AEPB Inhale 1 puff into the lungs daily. 28 each 6   No facility-administered medications prior to visit.      Per HPI unless specifically indicated in ROS section below Review of Systems     Objective:    BP 132/70 (BP Location: Left Arm, Patient Position: Sitting, Cuff Size: Normal)   Pulse 82   Temp 98 F (36.7 C) (Oral)   Ht 6\' 3"  (1.905 m)   Wt 211 lb 4 oz (95.8 kg)   SpO2 93%   BMI 26.40 kg/m   Wt Readings from Last 3 Encounters:  03/26/18 211 lb 4 oz (95.8 kg)  12/23/17 210 lb (95.3 kg)  09/25/17 213 lb (96.6 kg)    Physical Exam  Constitutional: He appears well-developed and well-nourished. No distress.  HENT:  Head: Normocephalic and atraumatic.  Right Ear: Hearing, tympanic membrane, external ear and ear canal normal.  Left Ear: Hearing, tympanic membrane, external ear and ear canal normal.  Nose: Nose normal. No mucosal edema or rhinorrhea. Right sinus exhibits no maxillary sinus tenderness and no frontal sinus tenderness. Left sinus exhibits no maxillary sinus tenderness and no frontal sinus tenderness.  Mouth/Throat: Uvula is midline, oropharynx is clear and moist and mucous membranes are normal. No oropharyngeal exudate, posterior oropharyngeal edema, posterior oropharyngeal erythema or tonsillar abscesses.  Eyes:  Pupils are equal, round, and reactive to light. Conjunctivae and EOM are normal. No scleral icterus.  Neck: Normal range of motion. Neck supple.  Cardiovascular: Normal rate, regular rhythm, normal heart sounds and intact distal pulses.  No murmur heard. Pulmonary/Chest: Effort normal. No respiratory distress. He has decreased breath sounds. He has no wheezes. He has no rales.  Harsh cough present but speaking in complete sentences  Lymphadenopathy:    He has no cervical adenopathy.  Skin: Skin is warm and dry. No rash noted.  Nursing note and vitals reviewed.  Results for orders placed or performed in visit on 02/01/18  Cologuard  Result Value Ref Range   Cologuard Negative       Assessment & Plan:   Problem List Items Addressed This Visit    COPD exacerbation ([place]) - Primary    Increased dyspnea, cough, increased sputum production.  Check CXR to r/o PNA - although lungs overall clear today.  Rx doxycycline, prednisone course, and albuterol rescue inhaler.  RTC 1 mo spirometry.       Relevant Medications   albuterol (PROVENTIL HFA;VENTOLIN HFA) 108 (90 Base) MCG/ACT inhaler   predniSONE (DELTASONE) 20 MG tablet   Other Relevant Orders   DG Chest 2 View   COPD (chronic obstructive pulmonary disease) ([facility])    rec he schedule breo daily and return in 1 month for baseline spirometry. Pt and wife agree with plan.       Relevant Medications   albuterol (PROVENTIL HFA;VENTOLIN HFA) 108 (90 Base) MCG/ACT inhaler   predniSONE (DELTASONE) 20 MG tablet       Meds ordered this encounter  Medications  . albuterol (PROVENTIL HFA;VENTOLIN HFA) 108 (90 Base) MCG/ACT inhaler    Sig: Inhale 2 puffs into the lungs every 6 (six) hours as needed for wheezing or shortness of breath.    Dispense:  1 Inhaler    Refill:  3  . doxycycline (VIBRA-TABS) 100 MG tablet    Sig: Take 1 tablet (100 mg total) by mouth 2 (two) times daily.    Dispense:  20 tablet    Refill:  0  . predniSONE  (DELTASONE) 20 MG tablet    Sig: Take two tablets daily for 3 days followed by one tablet daily for 4 days    Dispense:  10 tablet    Refill:  0   Orders Placed This Encounter  Procedures  . DG Chest 2 View    Standing Status:   Future    Number of Occurrences:   1    Standing Expiration Date:   05/27/2019    Order Specific Question:   Reason for Exam (SYMPTOM  OR DIAGNOSIS REQUIRED)    Answer:   COPD exacerbation    Order Specific Question:   Preferred imaging location?    Answer:   [facility]    Order Specific Question:   Radiology Contrast Protocol - do NOT remove file path    Answer:   [email or web address]    Follow up plan: Return in about 4 weeks (around 04/23/2018), or if  symptoms worsen or fail to improve, for follow up visit.  Ria Bush, MD

## 2018-03-26 NOTE — Patient Instructions (Addendum)
You have COPD exacerbation. Treat with doxycycline antibiotic and prednisone taper Take breo daily. May use albuterol rescue inhaler 2 puffs as needed for cough or shortness of breath.  If worsening, return for re evaluation. If over weekend, seek urgent care. Return in 1 month for spirometry testing in office.   Chronic Obstructive Pulmonary Disease Exacerbation Chronic obstructive pulmonary disease (COPD) is a common lung problem. In COPD, the flow of air from the lungs is limited. COPD exacerbations are times that breathing gets worse and you need extra treatment. Without treatment they can be life threatening. If they happen often, your lungs can become more damaged. If your COPD gets worse, your doctor may treat you with:  Medicines.  Oxygen.  Different ways to clear your airway, such as using a mask.  Follow these instructions at home:  Do not smoke.  Avoid tobacco smoke and other things that bother your lungs.  If given, take your antibiotic medicine as told. Finish the medicine even if you start to feel better.  Only take medicines as told by your doctor.  Drink enough fluids to keep your pee (urine) clear or pale yellow (unless your doctor has told you not to).  Use a cool mist machine (vaporizer).  If you use oxygen or a machine that turns liquid medicine into a mist (nebulizer), continue to use them as told.  Keep up with shots (vaccinations) as told by your doctor.  Exercise regularly.  Eat healthy foods.  Keep all doctor visits as told. Get help right away if:  You are very short of breath and it gets worse.  You have trouble talking.  You have bad chest pain.  You have blood in your spit (sputum).  You have a fever.  You keep throwing up (vomiting).  You feel weak, or you pass out (faint).  You feel confused.  You keep getting worse. This information is not intended to replace advice given to you by your health care provider. Make sure you  discuss any questions you have with your health care provider. Document Released: 06/05/2011 Document Revised: 11/22/2015 Document Reviewed: 02/18/2013 Elsevier Interactive Patient Education  2017 [employer].

## 2018-03-26 NOTE — Assessment & Plan Note (Addendum)
Increased dyspnea, cough, increased sputum production.  Check CXR to r/o PNA - although lungs overall clear today.  Rx doxycycline, prednisone course, and albuterol rescue inhaler.  RTC 1 mo spirometry.

## 2018-03-26 NOTE — Assessment & Plan Note (Signed)
rec he schedule breo daily and return in 1 month for baseline spirometry. Pt and wife agree with plan.

## 2018-03-26 NOTE — Telephone Encounter (Signed)
Seen today. 

## 2018-04-22 ENCOUNTER — Encounter: Payer: Self-pay | Admitting: Family Medicine

## 2018-04-22 NOTE — Progress Notes (Signed)
BP 136/82 (BP Location: Left Arm, Patient Position: Sitting, Cuff Size: Normal)   Pulse 63   Temp 97.8 F (36.6 C) (Oral)   Ht 6\' 3"  (1.905 m)   Wt 213 lb 8 oz (96.8 kg)   SpO2 94%   BMI 26.69 kg/m    CC: spirometry Subjective:    Patient ID: Carlos Olson, male    DOB: May 04, 1941, 77 y.o.   MRN: [id number]  HPI: Carlos Olson is a 76 y.o. male presenting on 04/23/2018 for COPD (1 mo spirometry f/u. )   Recent severe COPD exacerbation while at [facility] late Sept 2019, treated with albuterol rescue inhaler, doxycycline course, prednisone taper. Recovered well. He was not regularly using breo - has now started scheduled breo 1 puff daily. Flare symptoms have resolved.   Spiriva was unaffordable. breo more affordable - $25/month.   Here for spirometry 04/23/2018: Moderate airway obstruction Post bronchodilator test not clearly improved. Pre: FVC 75%, FEV1 52%, ratio 0.5 Post: FVC 74%, FEV1 54%, ratio 0.52  Low dose chest CT 09/2017: IMPRESSION: 1. Lung-RADS 2, benign appearance or behavior. Continue annual screening with low-dose chest CT without contrast in 12 months. 2. Three vessel coronary atherosclerosis. 3. Stable 4.6 cm ascending thoracic aortic aneurysm. Recommend semi-annual imaging followup by CTA or MRA and referral to cardiothoracic surgery if not already obtained. This recommendation follows 2010 ACCF/AHA/AATS/ACR/ASA/SCA/ SCAI/SIR/STS/SVM Guidelines for the Diagnosis and Management of Patients With Thoracic Aortic Disease. Circulation. 2010; 121: Y503-T465. Aortic Atherosclerosis (ICD10-I70.0) and Emphysema (ICD10-J43.9). Electronically Signed By: Ilona Sorrel M.D. On: 10/13/2017 15:03  Poorly healing wound nasal bridge for 2 wks.  Relevant past medical, surgical, family and social history reviewed and updated as indicated. Interim medical history since our last visit reviewed. Allergies and medications reviewed and updated. Outpatient Medications  Prior to Visit  Medication Sig Dispense Refill  . acetaminophen (TYLENOL) 500 MG tablet Take 1,000 mg by mouth 3 (three) times daily as needed for moderate pain or headache.     . albuterol (PROVENTIL HFA;VENTOLIN HFA) 108 (90 Base) MCG/ACT inhaler Inhale 2 puffs into the lungs every 6 (six) hours as needed for wheezing or shortness of breath. 1 Inhaler 3  . fluticasone furoate-vilanterol (BREO ELLIPTA) 100-25 MCG/INH AEPB Inhale 1 puff into the lungs daily. 28 each 6  . doxycycline (VIBRA-TABS) 100 MG tablet Take 1 tablet (100 mg total) by mouth 2 (two) times daily. 20 tablet 0  . predniSONE (DELTASONE) 20 MG tablet Take two tablets daily for 3 days followed by one tablet daily for 4 days 10 tablet 0   No facility-administered medications prior to visit.      Per HPI unless specifically indicated in ROS section below Review of Systems     Objective:    BP 136/82 (BP Location: Left Arm, Patient Position: Sitting, Cuff Size: Normal)   Pulse 63   Temp 97.8 F (36.6 C) (Oral)   Ht 6\' 3"  (1.905 m)   Wt 213 lb 8 oz (96.8 kg)   SpO2 94%   BMI 26.69 kg/m   Wt Readings from Last 3 Encounters:  04/23/18 213 lb 8 oz (96.8 kg)  03/26/18 211 lb 4 oz (95.8 kg)  12/23/17 210 lb (95.3 kg)    Physical Exam  Constitutional: He appears well-developed and well-nourished. No distress.  HENT:  Mouth/Throat: Oropharynx is clear and moist. No oropharyngeal exudate.  Eyes: Pupils are equal, round, and reactive to light. EOM are normal.  Cardiovascular: Normal rate, regular  rhythm and normal heart sounds.  No murmur heard. Pulmonary/Chest: Effort normal and breath sounds normal. No respiratory distress. He has no wheezes. He has no rales.  Lungs clear  Musculoskeletal: He exhibits no edema.  Skin: Skin is warm and dry. No rash noted.  Abrasion R nasal bridge  Nursing note and vitals reviewed.   DG Chest 2 View CLINICAL DATA:  COPD exacerbation  EXAM: CHEST - 2 VIEW  COMPARISON:   10/13/2017  FINDINGS: Cardiac shadows within normal limits. The lungs are hyperinflated consistent with COPD. Mild chronic interstitial changes are noted similar to that seen on prior CT examination. No acute abnormality is noted.  IMPRESSION: COPD without acute abnormality.  Electronically Signed   By: Inez Catalina M.D.   On: 03/26/2018 14:11      Assessment & Plan:   Problem List Items Addressed This Visit    COPD (chronic obstructive pulmonary disease) ([place]) - Primary    Spirometry consistent with stage 2 COPD without significant response to bronchodilator. Discussed with patient. Continue breo daily - tolerating well. COPD handout provided today.       Relevant Medications   albuterol (PROVENTIL) (2.5 MG/3ML) 0.083% nebulizer solution 2.5 mg (Completed)   Other Relevant Orders   PR EVAL OF BRONCHOSPASM (Completed)   Abrasion of nose    Poorly healing wound - pt attributes to glasses. Wound care reviewed. Update if persistent past 2 wks for derm eval r/o skin cancer.        Other Visit Diagnoses    Need for influenza vaccination       Relevant Orders   Flu Vaccine QUAD 36+ mos IM (Completed)       Meds ordered this encounter  Medications  . albuterol (PROVENTIL) (2.5 MG/3ML) 0.083% nebulizer solution 2.5 mg   Orders Placed This Encounter  Procedures  . Flu Vaccine QUAD 36+ mos IM  . PR EVAL OF BRONCHOSPASM    Follow up plan: No follow-ups on file.  Ria Bush, MD

## 2018-04-23 ENCOUNTER — Encounter: Payer: Self-pay | Admitting: Family Medicine

## 2018-04-23 ENCOUNTER — Ambulatory Visit (INDEPENDENT_AMBULATORY_CARE_PROVIDER_SITE_OTHER): Payer: Medicare HMO | Admitting: Family Medicine

## 2018-04-23 VITALS — BP 136/82 | HR 63 | Temp 97.8°F | Ht 75.0 in | Wt 213.5 lb

## 2018-04-23 DIAGNOSIS — J432 Centrilobular emphysema: Secondary | ICD-10-CM

## 2018-04-23 DIAGNOSIS — S0031XA Abrasion of nose, initial encounter: Secondary | ICD-10-CM | POA: Insufficient documentation

## 2018-04-23 DIAGNOSIS — Z23 Encounter for immunization: Secondary | ICD-10-CM | POA: Diagnosis not present

## 2018-04-23 MED ORDER — ALBUTEROL SULFATE (2.5 MG/3ML) 0.083% IN NEBU
2.5000 mg | INHALATION_SOLUTION | Freq: Once | RESPIRATORY_TRACT | Status: AC
  Administered 2018-04-23: 2.5 mg via RESPIRATORY_TRACT

## 2018-04-23 NOTE — Assessment & Plan Note (Signed)
Spirometry consistent with stage 2 COPD without significant response to bronchodilator. Discussed with patient. Continue breo daily - tolerating well. COPD handout provided today.

## 2018-04-23 NOTE — Assessment & Plan Note (Addendum)
Poorly healing wound - pt attributes to glasses. Wound care reviewed. Update if persistent past 2 wks for derm eval r/o skin cancer.

## 2018-04-23 NOTE — Patient Instructions (Signed)
Flu shot today.  Spirometry showing COPD stage 2 - continue breo daily.  Chronic Obstructive Pulmonary Disease Chronic obstructive pulmonary disease (COPD) is a long-term (chronic) lung problem. When you have COPD, it is hard for air to get in and out of your lungs. The way your lungs work will never return to normal. Usually the condition gets worse over time. There are things you can do to keep yourself as healthy as possible. Your doctor may treat your condition with:  Medicines.  Quitting smoking, if you smoke.  Rehabilitation. This may involve a team of specialists.  Oxygen.  Exercise and changes to your diet.  Lung surgery.  Comfort measures (palliative care).  Follow these instructions at home: Medicines  Take over-the-counter and prescription medicines only as told by your doctor.  Talk to your doctor before taking any cough or allergy medicines. You may need to avoid medicines that cause your lungs to be dry. Lifestyle  If you smoke, stop. Smoking makes the problem worse. If you need help quitting, ask your doctor.  Avoid being around things that make your breathing worse. This may include smoke, chemicals, and fumes.  Stay active, but remember to also rest.  Learn and use tips on how to relax.  Make sure you get enough sleep. Most adults need at least 7 hours a night.  Eat healthy foods. Eat smaller meals more often. Rest before meals. Controlled breathing  Learn and use tips on how to control your breathing as told by your doctor. Try: ? Breathing in (inhaling) through your nose for 1 second. Then, pucker your lips and breath out (exhale) through your lips for 2 seconds. ? Putting one hand on your belly (abdomen). Breathe in slowly through your nose for 1 second. Your hand on your belly should move out. Pucker your lips and breathe out slowly through your lips. Your hand on your belly should move in as you breathe out. Controlled coughing  Learn and use  controlled coughing to clear mucus from your lungs. The steps are: 1. Lean your head a little forward. 2. Breathe in deeply. 3. Try to hold your breath for 3 seconds. 4. Keep your mouth slightly open while coughing 2 times. 5. Spit any mucus out into a tissue. 6. Rest and do the steps again 1 or 2 times as needed. General instructions  Make sure you get all the shots (vaccines) that your doctor recommends. Ask your doctor about a flu shot and a pneumonia shot.  Use oxygen therapy and therapy to help improve your lungs (pulmonary rehabilitation) if told by your doctor. If you need home oxygen therapy, ask your doctor if you should buy a tool to measure your oxygen level (oximeter).  Make a COPD action plan with your doctor. This helps you know what to do if you feel worse than usual.  Manage any other conditions you have as told by your doctor.  Avoid going outside when it is very hot, cold, or humid.  Avoid people who have a sickness you can catch (contagious).  Keep all follow-up visits as told by your doctor. This is important. Contact a doctor if:  You cough up more mucus than usual.  There is a change in the color or thickness of the mucus.  It is harder to breathe than usual.  Your breathing is faster than usual.  You have trouble sleeping.  You need to use your medicines more often than usual.  You have trouble doing your normal activities  such as getting dressed or walking around the house. Get help right away if:  You have shortness of breath while resting.  You have shortness of breath that stops you from: ? Being able to talk. ? Doing normal activities.  Your chest hurts for longer than 5 minutes.  Your skin color is more blue than usual.  Your pulse oximeter shows that you have low oxygen for longer than 5 minutes.  You have a fever.  You feel too tired to breathe normally. Summary  Chronic obstructive pulmonary disease (COPD) is a long-term lung  problem.  The way your lungs work will never return to normal. Usually the condition gets worse over time. There are things you can do to keep yourself as healthy as possible.  Take over-the-counter and prescription medicines only as told by your doctor.  If you smoke, stop. Smoking makes the problem worse. This information is not intended to replace advice given to you by your health care provider. Make sure you discuss any questions you have with your health care provider. Document Released: 12/03/2007 Document Revised: 11/22/2015 Document Reviewed: 02/10/2013 Elsevier Interactive Patient Education  2017 [employer].

## 2018-05-28 DIAGNOSIS — H6122 Impacted cerumen, left ear: Secondary | ICD-10-CM | POA: Diagnosis not present

## 2018-07-26 ENCOUNTER — Telehealth: Payer: Self-pay | Admitting: Family Medicine

## 2018-07-26 MED ORDER — FLUTICASONE FUROATE-VILANTEROL 100-25 MCG/INH IN AEPB: 1.0000 | INHALATION_SPRAY | Freq: Every day | RESPIRATORY_TRACT | 3 refills | Status: DC

## 2018-07-26 NOTE — Telephone Encounter (Signed)
Escribed

## 2018-07-26 NOTE — Telephone Encounter (Signed)
Pt is requesting a refill on thew fluticasone furoate. Pt uses [facility] on [address] in [place]

## 2018-07-26 NOTE — Addendum Note (Signed)
Addended by: Brenton Grills on: 9/93/7169 67:89 AM   Modules accepted: Orders

## 2018-07-29 DIAGNOSIS — J441 Chronic obstructive pulmonary disease with (acute) exacerbation: Secondary | ICD-10-CM | POA: Diagnosis not present

## 2018-08-02 ENCOUNTER — Telehealth: Payer: Self-pay | Admitting: Family Medicine

## 2018-08-02 DIAGNOSIS — J432 Centrilobular emphysema: Secondary | ICD-10-CM

## 2018-08-02 NOTE — Telephone Encounter (Signed)
Referral placed.

## 2018-08-02 NOTE — Telephone Encounter (Signed)
Pt called office requesting Dr.G to put in a referral to see a lung specialist for his COPD. Pt had exacerbation last week and went to a walk in clinic. He was prescribed doxycycline hyclate and prednisone and to use albuterol inhaler . Pt states he is not having trouble breathing. Please advise if he needs to come in for a ov first.

## 2018-08-03 NOTE — Telephone Encounter (Signed)
Appt made and patient aware.  

## 2018-08-06 ENCOUNTER — Ambulatory Visit (INDEPENDENT_AMBULATORY_CARE_PROVIDER_SITE_OTHER): Payer: Medicare HMO | Admitting: Emergency Medicine

## 2018-08-06 ENCOUNTER — Encounter: Payer: Self-pay | Admitting: Emergency Medicine

## 2018-08-06 DIAGNOSIS — R918 Other nonspecific abnormal finding of lung field: Secondary | ICD-10-CM | POA: Diagnosis not present

## 2018-08-06 DIAGNOSIS — J432 Centrilobular emphysema: Secondary | ICD-10-CM | POA: Diagnosis not present

## 2018-08-06 MED ORDER — FLUTICASONE-UMECLIDIN-VILANT 100-62.5-25 MCG/INH IN AEPB: 1.0000 | INHALATION_SPRAY | Freq: Every day | RESPIRATORY_TRACT | 5 refills | Status: DC

## 2018-08-06 NOTE — Assessment & Plan Note (Signed)
Noted on his low-dose CT for lung cancer screening.  Unchanged 10/13/2017.  He did have thoracic aortic aneurysm that will require serial imaging.  We will stop doing LD CT and follow the nodules, gauge his risk based on the CT scans that he will have with Dr. Cyndia Bent in thoracic surgery.

## 2018-08-06 NOTE — Patient Instructions (Addendum)
We will perform full pulmonary function testing at your next office visit. Walking oximetry today on room air Pneumonia and influenza vaccines are up-to-date I would like to do a trial of Trelegy as a replacement to [facility].  We will notify you when we have samples of this medication so that we can start it.  You will do 1 inhalation once daily on a schedule.  Rinse and gargle after you use either of these medications. Keep your albuterol available to use 2 puffs if needed for shortness of breath, chest tightness, wheezing. Continue to follow with Dr. Cyndia Bent and get your CT scans of the chest performed per his schedule.  This will take the place of your lung cancer screening CT scans.  We will use these scans to track your small pulmonary nodules Follow with Dr Lamonte Sakai in 6 weeks with Full PFT or sooner if you have any problems

## 2018-08-06 NOTE — Progress Notes (Signed)
Subjective:    Patient ID: Carlos Olson, male    DOB: February 23, 1941, 78 y.o.   MRN: [id number]  HPI 78 year old man with a history of tobacco use (100+ pack years) participating in the lung cancer screening program, history of coronary disease, prostate cancer, IBS.  He was diagnosed with COPD approximately 6-7 yrs ago, has been on BD's (formerly Spiriva).  He experienced an acute exacerbation while at [facility] in September 2019, treated with pred and doxy. Since that time he has noticeable symptoms  Underwent spirometry locally with Dr. Danise Mina on 04/23/2018 that I reviewed.  This showed severe obstruction with an FEV1 of 1.9 L (52% predicted) without a bronchodilator response.  He has been managed on Breo since 3-4 yrs ago.    He has had fatigue, "heavy chest" a lot of cough, DOE for 2 months since a ? URI in December. He was [facility] with [facility] with doxy + pred >> better now. All sx improved. Exertional tolerance not quite back to baseline.   His most recent low-dose CT was 10/13/2017 that I reviewed.  This shows severe emphysema with mild bronchial wall thickening, scattered small pulmonary nodules that have been stable on serial films, RADS 2.   Review of Systems  Constitutional: Positive for appetite change. Negative for fever and unexpected weight change.  HENT: Positive for congestion. Negative for dental problem, ear pain, nosebleeds, postnasal drip, rhinorrhea, sinus pressure, sneezing, sore throat and trouble swallowing.   Eyes: Negative for redness and itching.  Respiratory: Positive for cough and shortness of breath. Negative for chest tightness and wheezing.   Cardiovascular: Negative for palpitations and leg swelling.  Gastrointestinal: Negative for nausea and vomiting.  Genitourinary: Negative for dysuria.  Musculoskeletal: Negative for joint swelling.  Skin: Negative for rash.  Neurological: Positive for headaches.  Hematological: Does not  bruise/bleed easily.  Psychiatric/Behavioral: Negative for dysphoric mood. The patient is not nervous/anxious.     Past Medical History:  Diagnosis Date  . Atherosclerosis of aortic arch ([place]) 09/25/2015   By CT scan   . CAD (coronary artery disease) 09/25/2015   Mild 3v by CT scan   . Cancer [facility])    prostate  . Colon polyps   . COPD (chronic obstructive pulmonary disease) ([place])   . Diverticulosis of colon   . Femoral bruit    bilateral, found by Dr. Jefm Bryant  . Heart murmur    child  . History of prostate cancer   . IBS (irritable bowel syndrome)    improved after retirement  . 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 Pelvic fracture ([place])   . Pneumonia    child  . Pulmonary nodules 09/25/2015   On screening CT scan, rpt 1 yr   . Shortness of breath    doe     Family History  Problem Relation Age of Onset  . Hypertension Mother   . Cancer Father        bone, prostate  . Diabetes Sister        x 40 years  . Cancer Brother        prostate     Social History   Socioeconomic History  . Marital status: Married    Spouse name: Not on file  . Number of children: 2  . Years of education: Not on file  . Highest education level: Not on file  Occupational History  . Occupation: Medical illustrator, retired  Social Needs  . Financial resource strain: Not on file  . Food insecurity:    Worry: Not on file    Inability: Not on file  . Transportation needs:    Medical: Not on file    Non-medical: Not on file  Tobacco Use  . Smoking status: Former Smoker    Packs/day: 2.00    Years: 52.00    Pack years: 104.00    Types: Cigarettes    Last attempt to quit: 05/15/2007    Years since quitting: 11.2  . Smokeless tobacco: Never Used  . Tobacco comment: quit 06/16/2007/encouraged to remain smoke free  Substance and Sexual Activity  . Alcohol use: Yes    Alcohol/week: 14.0 standard drinks    Types: 14 Standard drinks or equivalent  per week    Comment: 2 drinks a day  . Drug use: No  . Sexual activity: Never  Lifestyle  . Physical activity:    Days per week: Not on file    Minutes per session: Not on file  . Stress: Not on file  Relationships  . Social connections:    Talks on phone: Not on file    Gets together: Not on file    Attends religious service: Not on file    Active member of club or organization: Not on file    Attends meetings of clubs or organizations: Not on file    Relationship status: Not on file  . Intimate partner violence:    Fear of current or ex partner: Not on file    Emotionally abused: Not on file    Physically abused: Not on file    Forced sexual activity: Not on file  Other Topics Concern  . Not on file  Social History Narrative   Prefers I call him "[name]"   Lives with wife. Grown children.    Occupation: retired, was in Insurance underwriter business    Activity: home exercise routine dialy   Diet: some water, vegetables daily   [place].  Has lived in [place], [place], [place], [place]   Allergies  Allergen Reactions  . Sulfonamide Derivatives Itching  . Sulfasalazine Itching     Outpatient Medications Prior to Visit  Medication Sig Dispense Refill  . acetaminophen (TYLENOL) 500 MG tablet Take 1,000 mg by mouth 3 (three) times daily as needed for moderate pain or headache.     . albuterol (PROVENTIL HFA;VENTOLIN HFA) 108 (90 Base) MCG/ACT inhaler Inhale 2 puffs into the lungs every 6 (six) hours as needed for wheezing or shortness of breath. 1 Inhaler 3  . fluticasone furoate-vilanterol (BREO ELLIPTA) 100-25 MCG/INH AEPB Inhale 1 puff into the lungs daily. 28 each 3   No facility-administered medications prior to visit.         Objective:   Physical Exam Vitals:   08/06/18 1330  BP: (!) 148/80  Pulse: 85  SpO2: 94%  Weight: 213 lb (96.6 kg)  Height: 6\' 3"  (1.905 m)    Gen: Pleasant, tall, well-nourished, in no distress,  normal affect  ENT: No lesions,   mouth clear,  oropharynx clear, no postnasal drip  Neck: No JVD, no stridor  Lungs: No use of accessory muscles, no crackles or wheezing on normal respiration, soft end-exp wheeze on forced expiration  Cardiovascular: RRR, heart sounds normal, no murmur or gallops, no peripheral edema  Musculoskeletal: No deformities, no cyanosis or clubbing  Neuro: alert, awake, non focal  Skin: Warm, no lesions or rash  Assessment & Plan:  COPD (chronic obstructive pulmonary disease) ([place]) Given his severe obstruction on spirometry, daily symptoms I think will be reasonable to add LAMA to his regimen.  Because he has had to acute exacerbations in the last 73months he does need to continue the ICS component.  I would like to try Trelegy as a substitute for Breo to see if he gets more benefit.  We do not have samples here today.  When we do get samples of Trelegy then we will give him enough to try for 3 to 4 weeks to see if he prefers it, improves his breathing.  He needs full pulmonary function testing.  We will also perform a walking oximetry today to ensure no evidence for occult desaturation.  We will perform full pulmonary function testing at your next office visit. Walking oximetry today on room air Pneumonia and influenza vaccines are up-to-date I would like to do a trial of Trelegy as a replacement to [facility].  We will notify you when we have samples of this medication so that we can start it.  You will do 1 inhalation once daily on a schedule.  Rinse and gargle after you use either of these medications. Keep your albuterol available to use 2 puffs if needed for shortness of breath, chest tightness, wheezing. Follow with Dr Lamonte Sakai in 6 weeks with Full PFT or sooner if you have any problems  Pulmonary nodules Noted on his low-dose CT for lung cancer screening.  Unchanged 10/13/2017.  He did have thoracic aortic aneurysm that will require serial imaging.  We will stop doing LD CT and follow the  nodules, gauge his risk based on the CT scans that he will have with Dr. Cyndia Bent in thoracic surgery.  Baltazar Apo, MD, PhD 08/06/2018, 2:08 PM [facility] [phone] or if no answer [phone]

## 2018-08-06 NOTE — Addendum Note (Signed)
Addended by: Desmond Dike C on: 08/06/2018 02:10 PM   Modules accepted: Orders

## 2018-08-06 NOTE — Assessment & Plan Note (Signed)
Given his severe obstruction on spirometry, daily symptoms I think will be reasonable to add LAMA to his regimen.  Because he has had to acute exacerbations in the last 1months he does need to continue the ICS component.  I would like to try Trelegy as a substitute for Breo to see if he gets more benefit.  We do not have samples here today.  When we do get samples of Trelegy then we will give him enough to try for 3 to 4 weeks to see if he prefers it, improves his breathing.  He needs full pulmonary function testing.  We will also perform a walking oximetry today to ensure no evidence for occult desaturation.  We will perform full pulmonary function testing at your next office visit. Walking oximetry today on room air Pneumonia and influenza vaccines are up-to-date I would like to do a trial of Trelegy as a replacement to [facility].  We will notify you when we have samples of this medication so that we can start it.  You will do 1 inhalation once daily on a schedule.  Rinse and gargle after you use either of these medications. Keep your albuterol available to use 2 puffs if needed for shortness of breath, chest tightness, wheezing. Follow with Dr Lamonte Sakai in 6 weeks with Full PFT or sooner if you have any problems

## 2018-08-31 DIAGNOSIS — J441 Chronic obstructive pulmonary disease with (acute) exacerbation: Secondary | ICD-10-CM | POA: Diagnosis not present

## 2018-09-28 ENCOUNTER — Ambulatory Visit: Payer: Medicare HMO | Admitting: Emergency Medicine

## 2018-09-29 ENCOUNTER — Other Ambulatory Visit: Payer: Self-pay | Admitting: Surgery

## 2018-09-29 DIAGNOSIS — I7121 Aneurysm of the ascending aorta, without rupture: Secondary | ICD-10-CM

## 2018-09-29 DIAGNOSIS — I712 Thoracic aortic aneurysm, without rupture, unspecified: Secondary | ICD-10-CM

## 2018-10-01 ENCOUNTER — Ambulatory Visit: Payer: Medicare HMO

## 2018-10-04 ENCOUNTER — Encounter: Payer: Medicare HMO | Admitting: Family Medicine

## 2018-11-08 ENCOUNTER — Telehealth: Payer: Self-pay | Admitting: Family Medicine

## 2018-11-08 NOTE — Telephone Encounter (Signed)
Spoke with pt answering questions/concerns about having a video visit. Pt is satisfied.

## 2018-11-08 NOTE — Telephone Encounter (Signed)
Pt does not have a virtual visit set up but would like a call to discuss how to do this. He is set up for in office in July. [phone]

## 2018-11-15 DIAGNOSIS — M5137 Other intervertebral disc degeneration, lumbosacral region: Secondary | ICD-10-CM | POA: Diagnosis not present

## 2018-11-15 DIAGNOSIS — M5416 Radiculopathy, lumbar region: Secondary | ICD-10-CM | POA: Diagnosis not present

## 2018-11-15 DIAGNOSIS — M9904 Segmental and somatic dysfunction of sacral region: Secondary | ICD-10-CM | POA: Diagnosis not present

## 2018-11-15 DIAGNOSIS — M7918 Myalgia, other site: Secondary | ICD-10-CM | POA: Diagnosis not present

## 2018-11-15 DIAGNOSIS — M4306 Spondylolysis, lumbar region: Secondary | ICD-10-CM | POA: Diagnosis not present

## 2018-11-15 DIAGNOSIS — M5136 Other intervertebral disc degeneration, lumbar region: Secondary | ICD-10-CM | POA: Diagnosis not present

## 2018-11-15 DIAGNOSIS — M545 Low back pain: Secondary | ICD-10-CM | POA: Diagnosis not present

## 2018-11-16 DIAGNOSIS — M5136 Other intervertebral disc degeneration, lumbar region: Secondary | ICD-10-CM | POA: Diagnosis not present

## 2018-11-16 DIAGNOSIS — M4306 Spondylolysis, lumbar region: Secondary | ICD-10-CM | POA: Diagnosis not present

## 2018-11-16 DIAGNOSIS — M5137 Other intervertebral disc degeneration, lumbosacral region: Secondary | ICD-10-CM | POA: Diagnosis not present

## 2018-11-16 DIAGNOSIS — M5416 Radiculopathy, lumbar region: Secondary | ICD-10-CM | POA: Diagnosis not present

## 2018-11-16 DIAGNOSIS — M7918 Myalgia, other site: Secondary | ICD-10-CM | POA: Diagnosis not present

## 2018-11-16 DIAGNOSIS — M9904 Segmental and somatic dysfunction of sacral region: Secondary | ICD-10-CM | POA: Diagnosis not present

## 2018-11-16 DIAGNOSIS — M545 Low back pain: Secondary | ICD-10-CM | POA: Diagnosis not present

## 2018-11-17 DIAGNOSIS — M4306 Spondylolysis, lumbar region: Secondary | ICD-10-CM | POA: Diagnosis not present

## 2018-11-17 DIAGNOSIS — M5136 Other intervertebral disc degeneration, lumbar region: Secondary | ICD-10-CM | POA: Diagnosis not present

## 2018-11-17 DIAGNOSIS — M5416 Radiculopathy, lumbar region: Secondary | ICD-10-CM | POA: Diagnosis not present

## 2018-11-17 DIAGNOSIS — M7918 Myalgia, other site: Secondary | ICD-10-CM | POA: Diagnosis not present

## 2018-11-17 DIAGNOSIS — M9904 Segmental and somatic dysfunction of sacral region: Secondary | ICD-10-CM | POA: Diagnosis not present

## 2018-11-17 DIAGNOSIS — M545 Low back pain: Secondary | ICD-10-CM | POA: Diagnosis not present

## 2018-11-17 DIAGNOSIS — M5137 Other intervertebral disc degeneration, lumbosacral region: Secondary | ICD-10-CM | POA: Diagnosis not present

## 2018-11-18 DIAGNOSIS — M5136 Other intervertebral disc degeneration, lumbar region: Secondary | ICD-10-CM | POA: Diagnosis not present

## 2018-11-18 DIAGNOSIS — M5137 Other intervertebral disc degeneration, lumbosacral region: Secondary | ICD-10-CM | POA: Diagnosis not present

## 2018-11-18 DIAGNOSIS — M7918 Myalgia, other site: Secondary | ICD-10-CM | POA: Diagnosis not present

## 2018-11-18 DIAGNOSIS — M4306 Spondylolysis, lumbar region: Secondary | ICD-10-CM | POA: Diagnosis not present

## 2018-11-18 DIAGNOSIS — M9904 Segmental and somatic dysfunction of sacral region: Secondary | ICD-10-CM | POA: Diagnosis not present

## 2018-11-18 DIAGNOSIS — M5416 Radiculopathy, lumbar region: Secondary | ICD-10-CM | POA: Diagnosis not present

## 2018-11-18 DIAGNOSIS — M545 Low back pain: Secondary | ICD-10-CM | POA: Diagnosis not present

## 2018-11-19 DIAGNOSIS — M5136 Other intervertebral disc degeneration, lumbar region: Secondary | ICD-10-CM | POA: Diagnosis not present

## 2018-11-19 DIAGNOSIS — M5137 Other intervertebral disc degeneration, lumbosacral region: Secondary | ICD-10-CM | POA: Diagnosis not present

## 2018-11-19 DIAGNOSIS — M5416 Radiculopathy, lumbar region: Secondary | ICD-10-CM | POA: Diagnosis not present

## 2018-11-19 DIAGNOSIS — M4306 Spondylolysis, lumbar region: Secondary | ICD-10-CM | POA: Diagnosis not present

## 2018-11-19 DIAGNOSIS — M545 Low back pain: Secondary | ICD-10-CM | POA: Diagnosis not present

## 2018-11-19 DIAGNOSIS — M7918 Myalgia, other site: Secondary | ICD-10-CM | POA: Diagnosis not present

## 2018-11-19 DIAGNOSIS — M9904 Segmental and somatic dysfunction of sacral region: Secondary | ICD-10-CM | POA: Diagnosis not present

## 2018-11-23 DIAGNOSIS — M9904 Segmental and somatic dysfunction of sacral region: Secondary | ICD-10-CM | POA: Diagnosis not present

## 2018-11-23 DIAGNOSIS — M4306 Spondylolysis, lumbar region: Secondary | ICD-10-CM | POA: Diagnosis not present

## 2018-11-23 DIAGNOSIS — M7918 Myalgia, other site: Secondary | ICD-10-CM | POA: Diagnosis not present

## 2018-11-23 DIAGNOSIS — M5136 Other intervertebral disc degeneration, lumbar region: Secondary | ICD-10-CM | POA: Diagnosis not present

## 2018-11-23 DIAGNOSIS — M5416 Radiculopathy, lumbar region: Secondary | ICD-10-CM | POA: Diagnosis not present

## 2018-11-23 DIAGNOSIS — M5137 Other intervertebral disc degeneration, lumbosacral region: Secondary | ICD-10-CM | POA: Diagnosis not present

## 2018-11-23 DIAGNOSIS — M545 Low back pain: Secondary | ICD-10-CM | POA: Diagnosis not present

## 2018-11-24 DIAGNOSIS — M5137 Other intervertebral disc degeneration, lumbosacral region: Secondary | ICD-10-CM | POA: Diagnosis not present

## 2018-11-24 DIAGNOSIS — M7918 Myalgia, other site: Secondary | ICD-10-CM | POA: Diagnosis not present

## 2018-11-24 DIAGNOSIS — M9904 Segmental and somatic dysfunction of sacral region: Secondary | ICD-10-CM | POA: Diagnosis not present

## 2018-11-24 DIAGNOSIS — M5416 Radiculopathy, lumbar region: Secondary | ICD-10-CM | POA: Diagnosis not present

## 2018-11-24 DIAGNOSIS — M545 Low back pain: Secondary | ICD-10-CM | POA: Diagnosis not present

## 2018-11-24 DIAGNOSIS — M5136 Other intervertebral disc degeneration, lumbar region: Secondary | ICD-10-CM | POA: Diagnosis not present

## 2018-11-24 DIAGNOSIS — M4306 Spondylolysis, lumbar region: Secondary | ICD-10-CM | POA: Diagnosis not present

## 2018-11-25 ENCOUNTER — Other Ambulatory Visit: Payer: Self-pay | Admitting: Emergency Medicine

## 2018-11-26 DIAGNOSIS — M5137 Other intervertebral disc degeneration, lumbosacral region: Secondary | ICD-10-CM | POA: Diagnosis not present

## 2018-11-26 DIAGNOSIS — M5136 Other intervertebral disc degeneration, lumbar region: Secondary | ICD-10-CM | POA: Diagnosis not present

## 2018-11-26 DIAGNOSIS — M7918 Myalgia, other site: Secondary | ICD-10-CM | POA: Diagnosis not present

## 2018-11-26 DIAGNOSIS — M545 Low back pain: Secondary | ICD-10-CM | POA: Diagnosis not present

## 2018-11-26 DIAGNOSIS — M9904 Segmental and somatic dysfunction of sacral region: Secondary | ICD-10-CM | POA: Diagnosis not present

## 2018-11-26 DIAGNOSIS — M4306 Spondylolysis, lumbar region: Secondary | ICD-10-CM | POA: Diagnosis not present

## 2018-11-26 DIAGNOSIS — M5416 Radiculopathy, lumbar region: Secondary | ICD-10-CM | POA: Diagnosis not present

## 2018-11-29 DIAGNOSIS — M7918 Myalgia, other site: Secondary | ICD-10-CM | POA: Diagnosis not present

## 2018-11-29 DIAGNOSIS — M5137 Other intervertebral disc degeneration, lumbosacral region: Secondary | ICD-10-CM | POA: Diagnosis not present

## 2018-11-29 DIAGNOSIS — M9904 Segmental and somatic dysfunction of sacral region: Secondary | ICD-10-CM | POA: Diagnosis not present

## 2018-11-29 DIAGNOSIS — M5416 Radiculopathy, lumbar region: Secondary | ICD-10-CM | POA: Diagnosis not present

## 2018-11-29 DIAGNOSIS — M4306 Spondylolysis, lumbar region: Secondary | ICD-10-CM | POA: Diagnosis not present

## 2018-11-29 DIAGNOSIS — M5136 Other intervertebral disc degeneration, lumbar region: Secondary | ICD-10-CM | POA: Diagnosis not present

## 2018-11-29 DIAGNOSIS — M545 Low back pain: Secondary | ICD-10-CM | POA: Diagnosis not present

## 2018-12-02 ENCOUNTER — Other Ambulatory Visit (HOSPITAL_COMMUNITY)
Admission: RE | Admit: 2018-12-02 | Discharge: 2018-12-02 | Disposition: A | Discharge status: Home / Self Care | Payer: Medicare HMO | Source: Ambulatory Visit | Attending: Emergency Medicine | Admitting: Emergency Medicine

## 2018-12-02 DIAGNOSIS — Z1159 Encounter for screening for other viral diseases: Secondary | ICD-10-CM | POA: Insufficient documentation

## 2018-12-03 ENCOUNTER — Other Ambulatory Visit: Payer: Self-pay | Admitting: Emergency Medicine

## 2018-12-03 DIAGNOSIS — J432 Centrilobular emphysema: Secondary | ICD-10-CM

## 2018-12-03 LAB — NOVEL CORONAVIRUS, NAA (HOSP ORDER, SEND-OUT TO REF LAB; TAT 18-24 HRS): SARS-CoV-2, NAA: NOT DETECTED

## 2018-12-06 ENCOUNTER — Other Ambulatory Visit: Payer: Self-pay

## 2018-12-06 ENCOUNTER — Ambulatory Visit (INDEPENDENT_AMBULATORY_CARE_PROVIDER_SITE_OTHER): Payer: Medicare HMO | Admitting: Emergency Medicine

## 2018-12-06 ENCOUNTER — Ambulatory Visit: Payer: Medicare HMO | Admitting: Nurse Practitioner

## 2018-12-06 ENCOUNTER — Encounter: Payer: Self-pay | Admitting: Nurse Practitioner

## 2018-12-06 DIAGNOSIS — J432 Centrilobular emphysema: Secondary | ICD-10-CM | POA: Diagnosis not present

## 2018-12-06 LAB — PULMONARY FUNCTION TEST
DL/VA % pred: 52 %
DL/VA: 2.02 ml/min/mmHg/L
DLCO unc % pred: 56 %
DLCO unc: 16.28 ml/min/mmHg
FEF 25-75 Post: 0.88 L/sec
FEF 25-75 Pre: 0.92 L/sec
FEF2575-%Change-Post: -4 %
FEF2575-%Pred-Post: 34 %
FEF2575-%Pred-Pre: 35 %
FEV1-%Change-Post: 1 %
FEV1-%Pred-Post: 65 %
FEV1-%Pred-Pre: 65 %
FEV1-Post: 2.37 L
FEV1-Pre: 2.35 L
FEV1FVC-%Change-Post: 4 %
FEV1FVC-%Pred-Pre: 66 %
FEV6-%Change-Post: -1 %
FEV6-%Pred-Post: 96 %
FEV6-%Pred-Pre: 97 %
FEV6-Post: 4.53 L
FEV6-Pre: 4.6 L
FEV6FVC-%Change-Post: 1 %
FEV6FVC-%Pred-Post: 101 %
FEV6FVC-%Pred-Pre: 100 %
FVC-%Change-Post: -2 %
FVC-%Pred-Post: 94 %
FVC-%Pred-Pre: 97 %
FVC-Post: 4.73 L
FVC-Pre: 4.87 L
Post FEV1/FVC ratio: 50 %
Post FEV6/FVC ratio: 96 %
Pre FEV1/FVC ratio: 48 %
Pre FEV6/FVC Ratio: 94 %
RV % pred: 146 %
RV: 4.24 L
TLC % pred: 119 %
TLC: 9.67 L

## 2018-12-06 MED ORDER — ALBUTEROL SULFATE HFA 108 (90 BASE) MCG/ACT IN AERS: 2.0000 | INHALATION_SPRAY | Freq: Four times a day (QID) | RESPIRATORY_TRACT | 3 refills | Status: DC | PRN

## 2018-12-06 NOTE — Progress Notes (Signed)
PFT done today. 

## 2018-12-06 NOTE — Assessment & Plan Note (Signed)
PFT in office today showed FEV1 = 65, normal TLC, reduction noted and uncorrected DLCO.  PFT discussed with patient in office today.  We will continue current therapy.  She has had a stable interval.  Patient Instructions  Reviewed PFT in office today - No change in therapy at this time Discussed deep breathing exercises Discussed staying active Continue trelegy - 1 inhalation once daily on a schedule.  Rinse and gargle after you use either of these medications. Keep your albuterol available to use 2 puffs if needed for shortness of breath, chest tightness, wheezing. Continue to follow with Dr. Cyndia Bent and get your CT scans of the chest performed per his schedule.  This will take the place of your lung cancer screening CT scans.  We will use these scans to track your small pulmonary nodules  Follow up: Follow with Dr Lamonte Sakai in 3 months or sooner if needed

## 2018-12-06 NOTE — Progress Notes (Signed)
@Patient  ID: Karleen Hampshire, male    DOB: 1940-12-21, 78 y.o.   MRN: [id number]  Chief Complaint  Patient presents with  . Follow-up    PFT done today. Reports his breathing has been good. No new concerns.     Referring provider: Ria Bush, MD  HPI 78 year old male with a history of former tobacco use (100+ pack years) participating in the lung cancer screening program, history of coronary disease, prostate cancer, IBS.  He was diagnosed with COPD approximately 6-7 yrs ago, has been on BD's (formerly Spiriva).  Patient of Dr. Lamonte Sakai  Tests:  PFT Results Latest Ref Rng & Units 12/06/2018  FVC-Pre L 4.87  FVC-Predicted Pre % 97  FVC-Post L 4.73  FVC-Predicted Post % 94  Pre FEV1/FVC % % 48  Post FEV1/FCV % % 50  FEV1-Pre L 2.35  FEV1-Predicted Pre % 65  FEV1-Post L 2.37  DLCO UNC% % 56  DLCO COR %Predicted % 52  TLC L 9.67  TLC % Predicted % 119  RV % Predicted % 146     12/06/18 - follow up Patient presents today for follow-up visit with PFT.  Was last seen by Dr. Lamonte Sakai on 08/06/2018 and was started on Trelegy.  He was formerly on [employer].  He states that he has been doing well with Trelegy.  States that overall this is been a stable interval for him.  Denies any significant shortness of breath.  He states that he does try to stay active and exercise daily.  Patient was referred to cardiology at last visit for ongoing surveillance of aortic aneurysm.  Patient states that he has a follow-up CT scan ordered on 12/15/2018 by cardiology which we will follow-up on lung nodules and aortic aneurysm. Denies f/c/s, n/v/d, hemoptysis, PND, leg swelling.     Allergies  Allergen Reactions  . Sulfonamide Derivatives Itching  . Sulfasalazine Itching    Immunization History  Administered Date(s) Administered  . Influenza Split 03/27/2011  . Influenza Whole 04/27/2001, 03/19/2009, 03/30/2013  . Influenza, High Dose Seasonal PF 04/21/2014, 04/11/2016  . Influenza,inj,Quad PF,6+ Mos  04/23/2018  . Influenza-Unspecified 03/31/2015, 04/14/2017  . Pneumococcal Conjugate-13 09/08/2014  . Pneumococcal Polysaccharide-23 04/27/2001, 06/22/2012  . Td 08/28/1998, 04/19/2009  . Zoster 03/27/2011  . Zoster Recombinat (Shingrix) 09/29/2017, 01/20/2018    Past Medical History:  Diagnosis Date  . Atherosclerosis of aortic arch ([place]) 09/25/2015   By CT scan   . CAD (coronary artery disease) 09/25/2015   Mild 3v by CT scan   . Cancer [facility])    prostate  . Colon polyps   . COPD (chronic obstructive pulmonary disease) ([place])   . Diverticulosis of colon   . Femoral bruit    bilateral, found by Dr. Jefm Bryant  . Heart murmur    child  . History of prostate cancer   . IBS (irritable bowel syndrome)    improved after retirement  . 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 Pelvic fracture ([place])   . Pneumonia    child  . Pulmonary nodules 09/25/2015   On screening CT scan, rpt 1 yr   . Shortness of breath    doe    Tobacco History: Social History   Tobacco Use  Smoking Status Former Smoker  . Packs/day: 2.00  . Years: 52.00  . Pack years: 104.00  . Types: Cigarettes  . Last attempt to quit: 05/15/2007  . Years since quitting: 11.5  Smokeless Tobacco Never  Used  Tobacco Comment   quit 06/16/2007/encouraged to remain smoke free   Counseling given: Not Answered Comment: quit 06/16/2007/encouraged to remain smoke free   Outpatient Encounter Medications as of 12/06/2018  Medication Sig  . acetaminophen (TYLENOL) 500 MG tablet Take 1,000 mg by mouth 3 (three) times daily as needed for moderate pain or headache.   . albuterol (VENTOLIN HFA) 108 (90 Base) MCG/ACT inhaler Inhale 2 puffs into the lungs every 6 (six) hours as needed for wheezing or shortness of breath.  . Fluticasone-Umeclidin-Vilant (TRELEGY ELLIPTA) 100-62.5-25 MCG/INH AEPB Inhale 1 puff into the lungs daily.  . [DISCONTINUED] albuterol (PROVENTIL HFA;VENTOLIN HFA) 108  (90 Base) MCG/ACT inhaler Inhale 2 puffs into the lungs every 6 (six) hours as needed for wheezing or shortness of breath.  . [DISCONTINUED] fluticasone furoate-vilanterol (BREO ELLIPTA) 100-25 MCG/INH AEPB Inhale 1 puff into the lungs daily. (Patient not taking: Reported on 12/06/2018)   No facility-administered encounter medications on file as of 12/06/2018.      Review of Systems  Review of Systems  Constitutional: Negative.  Negative for chills and fever.  HENT: Negative.   Respiratory: Negative for cough, shortness of breath and wheezing.   Cardiovascular: Negative.  Negative for chest pain, palpitations and leg swelling.  Gastrointestinal: Negative.   Allergic/Immunologic: Negative.   Neurological: Negative.   Psychiatric/Behavioral: Negative.        Physical Exam  BP 128/82   Pulse 78   Ht 6\' 3"  (1.905 m)   Wt 215 lb (97.5 kg)   SpO2 94%   BMI 26.87 kg/m   Wt Readings from Last 5 Encounters:  12/06/18 215 lb (97.5 kg)  08/06/18 213 lb (96.6 kg)  04/23/18 213 lb 8 oz (96.8 kg)  03/26/18 211 lb 4 oz (95.8 kg)  12/23/17 210 lb (95.3 kg)     Physical Exam Vitals signs and nursing note reviewed.  Constitutional:      General: He is not in acute distress.    Appearance: He is well-developed.  Cardiovascular:     Rate and Rhythm: Normal rate and regular rhythm.  Pulmonary:     Effort: Pulmonary effort is normal. No respiratory distress.     Breath sounds: Normal breath sounds. No wheezing or rhonchi.  Musculoskeletal:        General: No swelling.  Skin:    General: Skin is warm and dry.  Neurological:     Mental Status: He is alert and oriented to person, place, and time.       Assessment & Plan:   COPD (chronic obstructive pulmonary disease) ([place]) PFT in office today showed FEV1 = 65, normal TLC, reduction noted and uncorrected DLCO.  PFT discussed with patient in office today.  We will continue current therapy.  She has had a stable interval.  Patient  Instructions  Reviewed PFT in office today - No change in therapy at this time Discussed deep breathing exercises Discussed staying active Continue trelegy - 1 inhalation once daily on a schedule.  Rinse and gargle after you use either of these medications. Keep your albuterol available to use 2 puffs if needed for shortness of breath, chest tightness, wheezing. Continue to follow with Dr. Cyndia Bent and get your CT scans of the chest performed per his schedule.  This will take the place of your lung cancer screening CT scans.  We will use these scans to track your small pulmonary nodules  Follow up: Follow with Dr Lamonte Sakai in 3 months or sooner if needed  Fenton Foy, NP 12/06/2018

## 2018-12-06 NOTE — Patient Instructions (Signed)
Reviewed PFT in office today - No change in therapy at this time Discussed deep breathing exercises Discussed staying active Continue trelegy - 1 inhalation once daily on a schedule.  Rinse and gargle after you use either of these medications. Keep your albuterol available to use 2 puffs if needed for shortness of breath, chest tightness, wheezing. Continue to follow with Dr. Cyndia Bent and get your CT scans of the chest performed per his schedule.  This will take the place of your lung cancer screening CT scans.  We will use these scans to track your small pulmonary nodules  Follow up: Follow with Dr Lamonte Sakai in 3 months or sooner if needed

## 2018-12-08 ENCOUNTER — Ambulatory Visit: Payer: Medicare HMO | Admitting: Nurse Practitioner

## 2018-12-15 ENCOUNTER — Ambulatory Visit
Admission: RE | Admit: 2018-12-15 | Discharge: 2018-12-15 | Disposition: A | Discharge status: Home / Self Care | Payer: Medicare HMO | Source: Ambulatory Visit | Attending: Surgery | Admitting: Surgery

## 2018-12-15 ENCOUNTER — Ambulatory Visit: Payer: Medicare HMO | Admitting: Surgery

## 2018-12-15 DIAGNOSIS — I712 Thoracic aortic aneurysm, without rupture, unspecified: Secondary | ICD-10-CM

## 2018-12-15 MED ORDER — IOPAMIDOL (ISOVUE-370) INJECTION 76%
75.0000 mL | Freq: Once | INTRAVENOUS | Status: AC | PRN
  Administered 2018-12-15: 75 mL via INTRAVENOUS

## 2019-01-05 ENCOUNTER — Other Ambulatory Visit: Payer: Self-pay

## 2019-01-05 ENCOUNTER — Ambulatory Visit: Payer: Medicare HMO | Admitting: Surgery

## 2019-01-05 ENCOUNTER — Encounter: Payer: Self-pay | Admitting: Surgery

## 2019-01-05 VITALS — BP 159/83 | HR 70 | Temp 97.5°F | Resp 20 | Ht 75.0 in | Wt 215.0 lb

## 2019-01-05 DIAGNOSIS — I712 Thoracic aortic aneurysm, without rupture: Secondary | ICD-10-CM | POA: Diagnosis not present

## 2019-01-05 DIAGNOSIS — I7121 Aneurysm of the ascending aorta, without rupture: Secondary | ICD-10-CM

## 2019-01-05 NOTE — Progress Notes (Signed)
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  He denies any chest or back pain.  Current Outpatient Medications  Medication Sig Dispense Refill  . acetaminophen (TYLENOL) 500 MG tablet Take 1,000 mg by mouth 3 (three) times daily as needed for moderate pain or headache.     . albuterol (VENTOLIN HFA) 108 (90 Base) MCG/ACT inhaler Inhale 2 puffs into the lungs every 6 (six) hours as needed for wheezing or shortness of breath. 1 Inhaler 3  . Fluticasone-Umeclidin-Vilant (TRELEGY ELLIPTA) 100-62.5-25 MCG/INH AEPB Inhale 1 puff into the lungs daily. 60 each 5   No current facility-administered medications for this visit.      Physical Exam: BP (!) 159/83   Pulse 70   Temp (!) 97.5 F (36.4 C) (Skin)   Resp 20   Ht 6\' 3"  (1.905 m)   Wt 215 lb (97.5 kg)   SpO2 93% Comment: RA  BMI 26.87 kg/m  He looks well. Cardiac exam shows a regular rate and rhythm with normal heart sounds.  There is no murmur. Lungs are clear.   Diagnostic Tests:  CLINICAL DATA:  78 year old male with thoracic aortic aneurysm  EXAM: CT ANGIOGRAPHY CHEST WITH CONTRAST  TECHNIQUE: Multidetector CT imaging of the chest was performed using the standard protocol during bolus administration of intravenous contrast. Multiplanar CT image reconstructions and MIPs were obtained to evaluate the vascular anatomy.  CONTRAST:  11mL ISOVUE-370 IOPAMIDOL (ISOVUE-370) INJECTION 76%  COMPARISON:  Multiple priors. The most remote CT 01/27/2009, most recent 10/13/2017  FINDINGS: Cardiovascular:  Heart:  No cardiomegaly. No pericardial fluid/thickening. No significant coronary calcifications.  Aorta:  Greatest diameter of the ascending aorta measures 4.5 cm on the current study, estimated 4.6 cm on the previous study.  The most remote CT dated 01/27/2009 measured 4.5 cm by today's measurement. Atherosclerotic changes of the aortic arch. Three vessel arch with patent branch vessels. No dissection. No periaortic fluid. Diameter of the thoracic aorta at the hiatus measures 3.2 cm.  Pulmonary arteries:  Diameter of the main pulmonary artery measures 31 mm. No filling defects of the main pulmonary artery, right or left pulmonary artery, or the proximal segmental arteries.  Mediastinum/Nodes: No mediastinal adenopathy. Unremarkable appearance of the thoracic esophagus.  Unremarkable thoracic inlet  Lungs/Pleura: Paraseptal and centrilobular emphysema. No interlobular septal thickening. No pneumothorax. No pleural effusion. New centrilobular nodularity of the right lower lobe with evidence of mild endobronchial debris. No confluent airspace disease.  Bronchiectasis.  Upper Abdomen: No acute.  Musculoskeletal: No acute displaced fracture. Degenerative changes of the spine.  Review of the MIP images confirms the above findings.  IMPRESSION: Diameter of the ascending aorta is essentially unchanged dating to the study of 2010, measuring 4.5 cm on today's CT.  New tree-in-bud/centrilobular nodularity of the right lower lobe with endobronchial debris, compatible with bronchiolitis/bronchopneumonia, potentially aspiration related.  Aortic Atherosclerosis (ICD10-I70.0) and Emphysema (ICD10-J43.9).  Signed,  Dulcy Fanny. Dellia Nims, RPVI  [facility]  [facility]   Electronically Signed   By: Corrie Mckusick D.O.   On: 12/15/2018 13:28   Impression:  He has a stable 4.5 cm fusiform ascending aortic aneurysm which is essentially unchanged dating back to 2010.  This is still well below the 5.5 cm surgical threshold.  I stressed the importance of good blood pressure control  and preventing further enlargement and aortic dissection.   His blood pressure is mildly elevated today but he has a chart with him of his blood pressure readings that he takes at home and they are all usually in the 120-130 range.  I advised him against doing any heavy lifting of more than 35 pounds which she said he does not do due to his arthritis.  This is been stable for 10 years so I think it is reasonable to repeat the study in 2 more years.  I reviewed the CT images with him and answered all of his questions.  Plan:  He will return to see me in 2 years with a CTA of the chest.  I spent 15 minutes performing this established patient evaluation and > 50% of this time was spent face to face counseling and coordinating the care of this patient's aortic aneurysm.    Gaye Pollack, MD [facility] [phone]

## 2019-01-22 ENCOUNTER — Other Ambulatory Visit: Payer: Self-pay | Admitting: Family Medicine

## 2019-01-22 DIAGNOSIS — I2510 Atherosclerotic heart disease of native coronary artery without angina pectoris: Secondary | ICD-10-CM

## 2019-01-22 DIAGNOSIS — Z8546 Personal history of malignant neoplasm of prostate: Secondary | ICD-10-CM

## 2019-01-25 ENCOUNTER — Other Ambulatory Visit (INDEPENDENT_AMBULATORY_CARE_PROVIDER_SITE_OTHER): Payer: Medicare HMO

## 2019-01-25 ENCOUNTER — Other Ambulatory Visit: Payer: Self-pay

## 2019-01-25 ENCOUNTER — Ambulatory Visit: Payer: Medicare HMO

## 2019-01-25 DIAGNOSIS — Z8546 Personal history of malignant neoplasm of prostate: Secondary | ICD-10-CM | POA: Diagnosis not present

## 2019-01-25 DIAGNOSIS — I2510 Atherosclerotic heart disease of native coronary artery without angina pectoris: Secondary | ICD-10-CM | POA: Diagnosis not present

## 2019-01-25 LAB — CBC WITH DIFFERENTIAL/PLATELET
Basophils Absolute: 0.1 10*3/uL (ref 0.0–0.1)
Basophils Relative: 1 % (ref 0.0–3.0)
Eosinophils Absolute: 0.2 10*3/uL (ref 0.0–0.7)
Eosinophils Relative: 3.4 % (ref 0.0–5.0)
HCT: 42.8 % (ref 39.0–52.0)
Hemoglobin: 14.3 g/dL (ref 13.0–17.0)
Lymphocytes Relative: 22.3 % (ref 12.0–46.0)
Lymphs Abs: 1.1 10*3/uL (ref 0.7–4.0)
MCHC: 33.5 g/dL (ref 30.0–36.0)
MCV: 99.9 fl (ref 78.0–100.0)
Monocytes Absolute: 0.7 10*3/uL (ref 0.1–1.0)
Monocytes Relative: 13.7 % — ABNORMAL HIGH (ref 3.0–12.0)
Neutro Abs: 3 10*3/uL (ref 1.4–7.7)
Neutrophils Relative %: 59.6 % (ref 43.0–77.0)
Platelets: 172 10*3/uL (ref 150.0–400.0)
RBC: 4.28 Mil/uL (ref 4.22–5.81)
RDW: 13.7 % (ref 11.5–15.5)
WBC: 5.1 10*3/uL (ref 4.0–10.5)

## 2019-01-25 LAB — LIPID PANEL
Cholesterol: 144 mg/dL (ref 0–200)
HDL: 55 mg/dL (ref >39.00)
LDL Cholesterol: 74 mg/dL (ref 0–99)
NonHDL: 89.39
Total CHOL/HDL Ratio: 3
Triglycerides: 79 mg/dL (ref 0.0–149.0)
VLDL: 15.8 mg/dL (ref 0.0–40.0)

## 2019-01-25 LAB — BASIC METABOLIC PANEL
BUN: 14 mg/dL (ref 6–23)
CO2: 29 mEq/L (ref 19–32)
Calcium: 9.4 mg/dL (ref 8.4–10.5)
Chloride: 102 mEq/L (ref 96–112)
Creatinine, Ser: 0.96 mg/dL (ref 0.40–1.50)
GFR: 75.6 mL/min (ref >60.00)
Glucose, Bld: 93 mg/dL (ref 70–99)
Potassium: 4.2 mEq/L (ref 3.5–5.1)
Sodium: 139 mEq/L (ref 135–145)

## 2019-01-25 LAB — PSA: PSA: 0.01 ng/mL — ABNORMAL LOW (ref 0.10–4.00)

## 2019-01-27 ENCOUNTER — Encounter: Payer: Self-pay | Admitting: Family Medicine

## 2019-01-27 ENCOUNTER — Other Ambulatory Visit: Payer: Self-pay

## 2019-01-27 ENCOUNTER — Ambulatory Visit (INDEPENDENT_AMBULATORY_CARE_PROVIDER_SITE_OTHER): Payer: Medicare HMO | Admitting: Family Medicine

## 2019-01-27 ENCOUNTER — Ambulatory Visit (INDEPENDENT_AMBULATORY_CARE_PROVIDER_SITE_OTHER)
Admission: RE | Admit: 2019-01-27 | Discharge: 2019-01-27 | Disposition: A | Discharge status: Home / Self Care | Payer: Medicare HMO | Source: Ambulatory Visit | Attending: Family Medicine | Admitting: Family Medicine

## 2019-01-27 VITALS — BP 152/80 | HR 61 | Temp 97.7°F | Ht 75.5 in | Wt 213.1 lb

## 2019-01-27 DIAGNOSIS — R51 Headache: Secondary | ICD-10-CM

## 2019-01-27 DIAGNOSIS — I712 Thoracic aortic aneurysm, without rupture: Secondary | ICD-10-CM

## 2019-01-27 DIAGNOSIS — J432 Centrilobular emphysema: Secondary | ICD-10-CM | POA: Diagnosis not present

## 2019-01-27 DIAGNOSIS — R519 Headache, unspecified: Secondary | ICD-10-CM

## 2019-01-27 DIAGNOSIS — R42 Dizziness and giddiness: Secondary | ICD-10-CM | POA: Diagnosis not present

## 2019-01-27 DIAGNOSIS — I700 Atherosclerosis of aorta: Secondary | ICD-10-CM

## 2019-01-27 DIAGNOSIS — Z0000 Encounter for general adult medical examination without abnormal findings: Secondary | ICD-10-CM

## 2019-01-27 DIAGNOSIS — H81399 Other peripheral vertigo, unspecified ear: Secondary | ICD-10-CM

## 2019-01-27 DIAGNOSIS — Z8546 Personal history of malignant neoplasm of prostate: Secondary | ICD-10-CM | POA: Diagnosis not present

## 2019-01-27 DIAGNOSIS — I7121 Aneurysm of the ascending aorta, without rupture: Secondary | ICD-10-CM

## 2019-01-27 MED ORDER — ASPIRIN EC 81 MG PO TBEC: 81.0000 mg | DELAYED_RELEASE_TABLET | Freq: Every day | ORAL | Status: DC

## 2019-01-27 NOTE — Patient Instructions (Addendum)
See Carlos Olson to schedule head CT for today if possible.  I will also check neck artery ultrasound  Start aspirin 81mg  enteric coated daily.   Health Maintenance After Age 79 After age 79, you are at a higher risk for certain long-term diseases and infections as well as injuries from falls. Falls are a major cause of broken bones and head injuries in people who are older than age 98. Getting regular preventive care can help to keep you healthy and well. Preventive care includes getting regular testing and making lifestyle changes as recommended by your health care provider. Talk with your health care provider about:  Which screenings and tests you should have. A screening is a test that checks for a disease when you have no symptoms.  A diet and exercise plan that is right for you. What should I know about screenings and tests to prevent falls? Screening and testing are the best ways to find a health problem early. Early diagnosis and treatment give you the best chance of managing medical conditions that are common after age 32. Certain conditions and lifestyle choices may make you more likely to have a fall. Your health care provider may recommend:  Regular vision checks. Poor vision and conditions such as cataracts can make you more likely to have a fall. If you wear glasses, make sure to get your prescription updated if your vision changes.  Medicine review. Work with your health care provider to regularly review all of the medicines you are taking, including over-the-counter medicines. Ask your health care provider about any side effects that may make you more likely to have a fall. Tell your health care provider if any medicines that you take make you feel dizzy or sleepy.  Osteoporosis screening. Osteoporosis is a condition that causes the bones to get weaker. This can make the bones weak and cause them to break more easily.  Blood pressure screening. Blood pressure changes and medicines to  control blood pressure can make you feel dizzy.  Strength and balance checks. Your health care provider may recommend certain tests to check your strength and balance while standing, walking, or changing positions.  Foot health exam. Foot pain and numbness, as well as not wearing proper footwear, can make you more likely to have a fall.  Depression screening. You may be more likely to have a fall if you have a fear of falling, feel emotionally low, or feel unable to do activities that you used to do.  Alcohol use screening. Using too much alcohol can affect your balance and may make you more likely to have a fall. What actions can I take to lower my risk of falls? General instructions  Talk with your health care provider about your risks for falling. Tell your health care provider if: ? You fall. Be sure to tell your health care provider about all falls, even ones that seem minor. ? You feel dizzy, sleepy, or off-balance.  Take over-the-counter and prescription medicines only as told by your health care provider. These include any supplements.  Eat a healthy diet and maintain a healthy weight. A healthy diet includes low-fat dairy products, low-fat (lean) meats, and fiber from whole grains, beans, and lots of fruits and vegetables. Home safety  Remove any tripping hazards, such as rugs, cords, and clutter.  Install safety equipment such as grab bars in bathrooms and safety rails on stairs.  Keep rooms and walkways well-lit. Activity   Follow a regular exercise program to stay fit.  This will help you maintain your balance. Ask your health care provider what types of exercise are appropriate for you.  If you need a cane or walker, use it as recommended by your health care provider.  Wear supportive shoes that have nonskid soles. Lifestyle  Do not drink alcohol if your health care provider tells you not to drink.  If you drink alcohol, limit how much you have: ? 0-1 drink a day  for women. ? 0-2 drinks a day for men.  Be aware of how much alcohol is in your drink. In the [place] one drink equals one typical bottle of beer (12 oz), one-half glass of wine (5 oz), or one shot of hard liquor (1 oz).  Do not use any products that contain nicotine or tobacco, such as cigarettes and e-cigarettes. If you need help quitting, ask your health care provider. Summary  Having a healthy lifestyle and getting preventive care can help to protect your health and wellness after age 46.  Screening and testing are the best way to find a health problem early and help you avoid having a fall. Early diagnosis and treatment give you the best chance for managing medical conditions that are more common for people who are older than age 64.  Falls are a major cause of broken bones and head injuries in people who are older than age 14. Take precautions to prevent a fall at home.  Work with your health care provider to learn what changes you can make to improve your health and wellness and to prevent falls. This information is not intended to replace advice given to you by your health care provider. Make sure you discuss any questions you have with your health care provider. Document Released: 04/29/2017 Document Revised: 10/07/2018 Document Reviewed: 04/29/2017 Elsevier Patient Education  2020 [employer].

## 2019-01-27 NOTE — Progress Notes (Signed)
This visit was conducted in person.  BP (!) 152/80 (BP Location: Left Arm, Patient Position: Sitting, Cuff Size: Normal)   Pulse 61   Temp 97.7 F (36.5 C) (Temporal)   Ht 6' 3.5" (1.918 m)   Wt 213 lb 1 oz (96.6 kg)   SpO2 96%   BMI 26.28 kg/m    CC: AMW/CPE Subjective:    Patient ID: Carlos Olson, male    DOB: [birth year], 78 y.o.   MRN: [id number]  HPI: Carlos Olson is a 78 y.o. male presenting on 01/27/2019 for Medicare Wellness   Did not see health coach this year.  Enjoys his mid day nap.  Moved 2018 closer to [facility].  L knee replacement 2018. R knee replacement ~2014.    Hearing Screening   125Hz  250Hz  500Hz  1000Hz  2000Hz  3000Hz  4000Hz  6000Hz  8000Hz   Right ear:   40 0 40  0    Left ear:   40 40 40  0    Comments: Wears bilateral hearing aids.  Only needs in a crowd   Visual Acuity Screening   Right eye Left eye Both eyes  Without correction: 20/20 20/20 20/15   With correction:         Office Visit from 01/27/2019 in [place] at [facility] Total Score  0      Fall Risk  01/27/2019 09/21/2017 09/18/2016 09/07/2015 09/08/2014  Falls in the past year? 0 No No No No  Number falls in past yr: - - - - -  Injury with Fall? - - - - -  Comment - - - - -  Risk Factor Category  - - - - -  Comment - - - - -  Risk for fall due to : - - - - -     COPD s/p severe exacerbation 02/2018 - established with pulm who placed him on trelegy ellipta. Pt has not noticed any significant difference on this medicine.   Thoracic aneurysm - chest CT revealed 4.6cm ascending thoracic aortic aneurysm - has established with thoracic surgery for Q51yr monitoring. Home BP readings very well controlled (440-347 systolic)  Noticing increasing unsteadiness over the past year - tends to walk to the left. He is very cautious. No recent falls. Does not use ambulatory assistive device - but has cane at home.   Woke up this morning with lightheadedness headache and  vision changes "room shaking" possible vertigo. Some malaise. When he got out of bed, sat on commode with ongoing imbalance. Noted sudeen position changes worsened dizziness. BP this morning 140/80s. No chest pain, dyspnea, unilateral weakness, slurred speech, confusion. Increased water intake today - feeling better. No known h/o stroke.   Preventative: Colon 02/2013 - tortuous colon, diverticulosis and polyp, rpt 5 yrs ([facility]). Cologuard negative 12/2017.  Prostate cancer - s/p prostatectomy 1990s. PSA stable since.  Lung cancer screening - 100 PY history. Quit 2008 with chantix.Screening CT chest 08/2015 - small R lung nodules. Continued monitoring by thoracic surgery.  Flu shot yearly  Td 2010  Pneumovax 2013, prevnar 2016 zostavax 2012  shingrix - 09/2017, 12/2017 Received advanced directive and scanned into chart 08/2015. Teodora Medici then Wandra Feinstein are HCPOA.  Seat belt use discussed.  Sunscreen use discussed. No changing moles on skin. Ex smoker - quit 2008.  Alcohol - 2 drinks/day (bour[place]).  Dentist q6 mo  Eye exam yearly  Bowel - occasional constipation  Bladder - no incontinence   "[name]" Lives with wife. Grown  children.  Occupation: retired, was in Insurance underwriter business  Activity: home exercise routine daily Diet: some water, vegetables daily      Relevant past medical, surgical, family and social history reviewed and updated as indicated. Interim medical history since our last visit reviewed. Allergies and medications reviewed and updated. Outpatient Medications Prior to Visit  Medication Sig Dispense Refill  . acetaminophen (TYLENOL) 500 MG tablet Take 1,000 mg by mouth 3 (three) times daily as needed for moderate pain or headache.     . albuterol (VENTOLIN HFA) 108 (90 Base) MCG/ACT inhaler Inhale 2 puffs into the lungs every 6 (six) hours as needed for wheezing or shortness of breath. 1 Inhaler 3  . Fluticasone-Umeclidin-Vilant (TRELEGY ELLIPTA) 100-62.5-25  MCG/INH AEPB Inhale 1 puff into the lungs daily. 60 each 5   No facility-administered medications prior to visit.      Per HPI unless specifically indicated in ROS section below Review of Systems  Constitutional: Negative for activity change, appetite change, chills, fatigue, fever and unexpected weight change.  HENT: Negative for hearing loss.   Eyes: Positive for visual disturbance.  Respiratory: Positive for cough (chronic in known COPD). Negative for chest tightness, shortness of breath and wheezing.   Cardiovascular: Negative for chest pain, palpitations and leg swelling.  Gastrointestinal: Positive for diarrhea. Negative for abdominal distention, abdominal pain, blood in stool, constipation, nausea and vomiting.  Endocrine: Positive for cold intolerance.  Genitourinary: Negative for difficulty urinating and hematuria.  Musculoskeletal: Negative for arthralgias, myalgias and neck pain.  Skin: Negative for rash.  Neurological: Positive for dizziness and headaches. Negative for seizures and syncope.  Hematological: Negative for adenopathy. Does not bruise/bleed easily.  Psychiatric/Behavioral: Negative for dysphoric mood. The patient is not nervous/anxious.    Objective:    BP (!) 152/80 (BP Location: Left Arm, Patient Position: Sitting, Cuff Size: Normal)   Pulse 61   Temp 97.7 F (36.5 C) (Temporal)   Ht 6' 3.5" (1.918 m)   Wt 213 lb 1 oz (96.6 kg)   SpO2 96%   BMI 26.28 kg/m   Wt Readings from Last 3 Encounters:  01/27/19 213 lb 1 oz (96.6 kg)  01/05/19 215 lb (97.5 kg)  12/06/18 215 lb (97.5 kg)    Physical Exam Vitals signs and nursing note reviewed.  Constitutional:      General: He is not in acute distress.    Appearance: Normal appearance. He is well-developed.  HENT:     Head: Normocephalic and atraumatic.     Right Ear: Hearing, tympanic membrane, ear canal and external ear normal.     Left Ear: Hearing, tympanic membrane, ear canal and external ear normal.      Nose: Nose normal.     Mouth/Throat:     Mouth: Mucous membranes are moist.     Pharynx: Oropharynx is clear. No oropharyngeal exudate or posterior oropharyngeal erythema.     Comments: R soft palate doesn't rise  Eyes:     General: No scleral icterus.    Extraocular Movements: Extraocular movements intact.     Conjunctiva/sclera: Conjunctivae normal.     Pupils: Pupils are equal, round, and reactive to light.  Neck:     Musculoskeletal: Normal range of motion and neck supple.     Vascular: No carotid bruit.  Cardiovascular:     Rate and Rhythm: Normal rate and regular rhythm.     Pulses: Normal pulses.          Radial pulses are 2+ on the right  side and 2+ on the left side.     Heart sounds: Normal heart sounds. No murmur.  Pulmonary:     Effort: Pulmonary effort is normal. No respiratory distress.     Breath sounds: Normal breath sounds. No wheezing, rhonchi or rales.  Abdominal:     General: Abdomen is flat. Bowel sounds are normal. There is no distension.     Palpations: Abdomen is soft. There is no mass.     Tenderness: There is no abdominal tenderness. There is no guarding or rebound.     Hernia: No hernia is present.  Musculoskeletal: Normal range of motion.     Right lower leg: No edema.     Left lower leg: No edema.  Lymphadenopathy:     Cervical: No cervical adenopathy.  Skin:    General: Skin is warm and dry.     Findings: No rash.  Neurological:     General: No focal deficit present.     Mental Status: He is alert and oriented to person, place, and time.     Comments:  CN 2-12 intact Normal FTN EOMI Dix hallpike negative on both sides Recall 3/3 Calculation D-L-R-O-W  Psychiatric:        Mood and Affect: Mood normal.        Behavior: Behavior normal.        Thought Content: Thought content normal.        Judgment: Judgment normal.       Results for orders placed or performed in visit on 01/25/19  CBC with Differential/Platelet  Result Value Ref  Range   WBC 5.1 4.0 - 10.5 K/uL   RBC 4.28 4.22 - 5.81 Mil/uL   Hemoglobin 14.3 13.0 - 17.0 g/dL   HCT 42.8 39.0 - 52.0 %   MCV 99.9 78.0 - 100.0 fl   MCHC 33.5 30.0 - 36.0 g/dL   RDW 13.7 11.5 - 15.5 %   Platelets 172.0 150.0 - 400.0 K/uL   Neutrophils Relative % 59.6 43.0 - 77.0 %   Lymphocytes Relative 22.3 12.0 - 46.0 %   Monocytes Relative 13.7 (H) 3.0 - 12.0 %   Eosinophils Relative 3.4 0.0 - 5.0 %   Basophils Relative 1.0 0.0 - 3.0 %   Neutro Abs 3.0 1.4 - 7.7 K/uL   Lymphs Abs 1.1 0.7 - 4.0 K/uL   Monocytes Absolute 0.7 0.1 - 1.0 K/uL   Eosinophils Absolute 0.2 0.0 - 0.7 K/uL   Basophils Absolute 0.1 0.0 - 0.1 K/uL  PSA  Result Value Ref Range   PSA 0.01 (L) 0.10 - 4.00 ng/mL  Basic metabolic panel  Result Value Ref Range   Sodium 139 135 - 145 mEq/L   Potassium 4.2 3.5 - 5.1 mEq/L   Chloride 102 96 - 112 mEq/L   CO2 29 19 - 32 mEq/L   Glucose, Bld 93 70 - 99 mg/dL   BUN 14 6 - 23 mg/dL   Creatinine, Ser 0.96 0.40 - 1.50 mg/dL   Calcium 9.4 8.4 - 10.5 mg/dL   GFR 75.60 >60.00 mL/min  Lipid panel  Result Value Ref Range   Cholesterol 144 0 - 200 mg/dL   Triglycerides 79.0 0.0 - 149.0 mg/dL   HDL 55.00 >39.00 mg/dL   VLDL 15.8 0.0 - 40.0 mg/dL   LDL Cholesterol 74 0 - 99 mg/dL   Total CHOL/HDL Ratio 3    NonHDL 89.39    Assessment & Plan:   Problem List Items Addressed This Visit  Vertigo    Describes new episode this morning of imbalance, vertigo, headache. Some concern for stroke (asymmetrical palate elevation noted on exam) otherwise nonfocal neurological exam. Will send for stat head CT and check carotid US. I did advise start aspirin 81mg  daily while we evaluate for stroke. Pt agrees with plan. Symptoms currently resolved, pt feels back to baseline.       Thoracic ascending aortic aneurysm [facility])    Has established with thoracic surgery.       Relevant Medications   aspirin EC 81 MG tablet   PROSTATE CANCER, HX OF    PSA remains low.       Medicare annual wellness visit, subsequent - Primary    I have personally reviewed the Medicare Annual Wellness questionnaire and have noted 1. The patient's medical and social history 2. Their use of alcohol, tobacco or illicit drugs 3. Their current medications and supplements 4. The patient's functional ability including ADL's, fall risks, home safety risks and hearing or visual impairment. Cognitive function has been assessed and addressed as indicated.  5. Diet and physical activity 6. Evidence for depression or mood disorders The patients weight, height, BMI have been recorded in the chart. I have made referrals, counseling and provided education to the patient based on review of the above and I have provided the pt with a written personalized care plan for preventive services. Provider list updated.. See scanned questionairre as needed for further documentation. Reviewed preventative protocols and updated unless pt declined.       COPD (chronic obstructive pulmonary disease) (HCC)    Appreciate pulm care. Hasn't found significant benefit from trelegy - he will touch base with Dr Lamonte Sakai about possible return to breo (more affordable)      Atherosclerosis of aortic arch (HCC)   Relevant Medications   aspirin EC 81 MG tablet    Other Visit Diagnoses    Acute nonintractable headache, unspecified headache type       Relevant Medications   aspirin EC 81 MG tablet   Other Relevant Orders   CT Head Wo Contrast (Completed)   VAS US CAROTID   Other peripheral vertigo, unspecified ear       Relevant Orders   CT Head Wo Contrast (Completed)       Meds ordered this encounter  Medications  . aspirin EC 81 MG tablet    Sig: Take 1 tablet (81 mg total) by mouth daily.    Dispense:      Orders Placed This Encounter  Procedures  . CT Head Wo Contrast    SS. Rosaria Ferries [phone]/ An[facility] MCR     Standing Status:   Future    Number of Occurrences:   1    Standing Expiration Date:    04/28/2020    Order Specific Question:   ** REASON FOR EXAM (FREE TEXT)    Answer:   HA, dizziness, vertigo this morning    Order Specific Question:   Preferred imaging location?    Answer:   [place] [facility]    Order Specific Question:   Radiology Contrast Protocol - do NOT remove file path    Answer:   \\[email or web address]    Follow up plan: No follow-ups on file.  Ria Bush, MD

## 2019-01-28 DIAGNOSIS — R42 Dizziness and giddiness: Secondary | ICD-10-CM | POA: Insufficient documentation

## 2019-01-28 NOTE — Assessment & Plan Note (Signed)
Appreciate pulm care. Hasn't found significant benefit from trelegy - he will touch base with Dr Lamonte Sakai about possible return to breo (more affordable)

## 2019-01-28 NOTE — Assessment & Plan Note (Signed)
Has established with thoracic surgery.

## 2019-01-28 NOTE — Assessment & Plan Note (Signed)
PSA remains low

## 2019-01-28 NOTE — Assessment & Plan Note (Addendum)
Describes new episode this morning of imbalance, vertigo, headache. Some concern for stroke (asymmetrical palate elevation noted on exam) otherwise nonfocal neurological exam. Will send for stat head CT and check carotid US. I did advise start aspirin 81mg  daily while we evaluate for stroke. Pt agrees with plan. Symptoms currently resolved, pt feels back to baseline.

## 2019-01-28 NOTE — Assessment & Plan Note (Signed)

## 2019-02-03 ENCOUNTER — Ambulatory Visit (HOSPITAL_COMMUNITY)
Admission: RE | Admit: 2019-02-03 | Discharge: 2019-02-03 | Disposition: A | Discharge status: Home / Self Care | Payer: Medicare HMO | Source: Ambulatory Visit | Attending: Cardiology | Admitting: Cardiology

## 2019-02-03 ENCOUNTER — Other Ambulatory Visit: Payer: Self-pay

## 2019-02-03 DIAGNOSIS — R519 Headache, unspecified: Secondary | ICD-10-CM

## 2019-02-03 DIAGNOSIS — R51 Headache: Secondary | ICD-10-CM | POA: Insufficient documentation

## 2019-02-06 ENCOUNTER — Encounter: Payer: Self-pay | Admitting: Family Medicine

## 2019-02-06 DIAGNOSIS — I6523 Occlusion and stenosis of bilateral carotid arteries: Secondary | ICD-10-CM | POA: Insufficient documentation

## 2019-02-09 ENCOUNTER — Telehealth: Payer: Self-pay | Admitting: Emergency Medicine

## 2019-02-09 NOTE — Telephone Encounter (Signed)
Called and spoke w/ pt. Pt states he would like to go back to Breo 100. He states he recently tried Trelegy for the past 4-5 months and noticed no change in his breathing and then he hit the "donut hole" with his insurance coverage. Pt last Breo 100 on file lasted from 09/25/2016-09/21/2017.   Routing to RB for follow-up. RB, please advise if you would be ok with pt switching back to Breo 100. Thank you.

## 2019-02-11 MED ORDER — BREO ELLIPTA 100-25 MCG/INH IN AEPB: 1.0000 | INHALATION_SPRAY | Freq: Every day | RESPIRATORY_TRACT | 5 refills | Status: DC

## 2019-02-11 NOTE — Telephone Encounter (Signed)
Called and spoke with pt letting him know that [place] said he could go back to using Breo. Pt verbalized understanding. Asked pt if he needed another Rx of Breo sent to pharmacy and he stated that he did. Verified pt's preferred pharmacy and sent Rx in for pt. Nothing further needed.

## 2019-02-11 NOTE — Telephone Encounter (Signed)
OK with me to try changing back to breo

## 2019-02-23 DIAGNOSIS — R69 Illness, unspecified: Secondary | ICD-10-CM | POA: Diagnosis not present

## 2019-03-15 ENCOUNTER — Encounter: Payer: Self-pay | Admitting: Emergency Medicine

## 2019-03-15 ENCOUNTER — Other Ambulatory Visit: Payer: Self-pay

## 2019-03-15 ENCOUNTER — Ambulatory Visit: Payer: Medicare HMO | Admitting: Emergency Medicine

## 2019-03-15 DIAGNOSIS — R918 Other nonspecific abnormal finding of lung field: Secondary | ICD-10-CM | POA: Diagnosis not present

## 2019-03-15 DIAGNOSIS — J432 Centrilobular emphysema: Secondary | ICD-10-CM

## 2019-03-15 NOTE — Progress Notes (Signed)
Subjective:    Patient ID: Carlos Olson, male    DOB: 1941-02-28, 78 y.o.   MRN: [id number]  HPI  ROV 03/15/2019 --follow-up visit for 78 year old gentleman with COPD, severe obstruction by spirometry.  At his last visit I tried changing his Breo to Trelegy to see if he would get more benefit.  He did not necessarily feel much difference in his overall functional capacity on Trelegy.  For insurance reasons he has since been switched back to [place].  He reports now breathing is doing well. He has started walking every morning. His breathing will limit his speed some. Very rare albuterol use. No AE since last time. Daily am cough - clear white mucous.   He participates in the lung cancer screening program, also has interval CT chest for aortic aneurysm, last was 6/17 which I reviewed and which showed some new tree-in-bud centrilobular nodularity in the right lower lobe of unclear significance.  Next CT scan with Dr. Cyndia Bent is planned for 2 years from now.    Review of Systems  Constitutional: Positive for appetite change. Negative for fever and unexpected weight change.  HENT: Positive for congestion. Negative for dental problem, ear pain, nosebleeds, postnasal drip, rhinorrhea, sinus pressure, sneezing, sore throat and trouble swallowing.   Eyes: Negative for redness and itching.  Respiratory: Positive for cough and shortness of breath. Negative for chest tightness and wheezing.   Cardiovascular: Negative for palpitations and leg swelling.  Gastrointestinal: Negative for nausea and vomiting.  Genitourinary: Negative for dysuria.  Musculoskeletal: Negative for joint swelling.  Skin: Negative for rash.  Neurological: Positive for headaches.  Hematological: Does not bruise/bleed easily.  Psychiatric/Behavioral: Negative for dysphoric mood. The patient is not nervous/anxious.     Past Medical History:  Diagnosis Date  . Atherosclerosis of aortic arch ([place]) 09/25/2015   By CT scan   . CAD  (coronary artery disease) 09/25/2015   Mild 3v by CT scan   . Cancer [facility])    prostate  . Colon polyps   . COPD (chronic obstructive pulmonary disease) ([place])   . Diverticulosis of colon   . Femoral bruit    bilateral, found by Dr. Jefm Bryant  . Heart murmur    child  . History of prostate cancer   . IBS (irritable bowel syndrome)    improved after retirement  . 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 Pelvic fracture ([place])   . Pneumonia    child  . Pulmonary nodules 09/25/2015   On screening CT scan, rpt 1 yr   . Shortness of breath    doe     Family History  Problem Relation Age of Onset  . Hypertension Mother   . Cancer Father        bone, prostate  . Diabetes Sister        x 40 years  . Cancer Brother        prostate     Social History   Socioeconomic History  . Marital status: Married    Spouse name: Not on file  . Number of children: 2  . Years of education: Not on file  . Highest education level: Not on file  Occupational History  . Occupation: Medical illustrator, retired  Scientific laboratory technician  . Financial resource strain: Not on file  . Food insecurity    Worry: Not on file    Inability: Not on file  . Transportation needs    Medical:  Not on file    Non-medical: Not on file  Tobacco Use  . Smoking status: Former Smoker    Packs/day: 2.00    Years: 52.00    Pack years: 104.00    Types: Cigarettes    Quit date: 05/15/2007    Years since quitting: 11.8  . Smokeless tobacco: Never Used  . Tobacco comment: quit 06/16/2007/encouraged to remain smoke free  Substance and Sexual Activity  . Alcohol use: Yes    Alcohol/week: 14.0 standard drinks    Types: 14 Standard drinks or equivalent per week    Comment: 2 drinks a day  . Drug use: No  . Sexual activity: Never  Lifestyle  . Physical activity    Days per week: Not on file    Minutes per session: Not on file  . Stress: Not on file  Relationships  . Social  Herbalist on phone: Not on file    Gets together: Not on file    Attends religious service: Not on file    Active member of club or organization: Not on file    Attends meetings of clubs or organizations: Not on file    Relationship status: Not on file  . Intimate partner violence    Fear of current or ex partner: Not on file    Emotionally abused: Not on file    Physically abused: Not on file    Forced sexual activity: Not on file  Other Topics Concern  . Not on file  Social History Narrative   Prefers I call him "[name]"   Lives with wife. Grown children.    Occupation: retired, was in Insurance underwriter business    Activity: home exercise routine dialy   Diet: some water, vegetables daily   [place].  Has lived in [place], [place], [place], [place] No [employer]   Allergies  Allergen Reactions  . Sulfonamide Derivatives Itching  . Sulfasalazine Itching     Outpatient Medications Prior to Visit  Medication Sig Dispense Refill  . acetaminophen (TYLENOL) 500 MG tablet Take 1,000 mg by mouth 3 (three) times daily as needed for moderate pain or headache.     . albuterol (VENTOLIN HFA) 108 (90 Base) MCG/ACT inhaler Inhale 2 puffs into the lungs every 6 (six) hours as needed for wheezing or shortness of breath. 1 Inhaler 3  . aspirin EC 81 MG tablet Take 1 tablet (81 mg total) by mouth daily.    . fluticasone furoate-vilanterol (BREO ELLIPTA) 100-25 MCG/INH AEPB Inhale 1 puff into the lungs daily. 30 each 5   No facility-administered medications prior to visit.         Objective:   Physical Exam Vitals:   03/15/19 0923  BP: (!) 144/82  Pulse: 62  SpO2: 96%  Weight: 215 lb (97.5 kg)  Height: 6\' 3"  (1.905 m)    Gen: Pleasant, tall, well-nourished, in no distress,  normal affect  ENT: No lesions,  mouth clear,  oropharynx clear, no postnasal drip  Neck: No JVD, no stridor  Lungs: No use of accessory muscles, no crackles or wheezing on normal respiration, very soft  minimal end expiratory wheeze on forced expiration  Cardiovascular: RRR, heart sounds normal, no murmur or gallops, no peripheral edema  Musculoskeletal: No deformities, no cyanosis or clubbing  Neuro: alert, awake, non focal  Skin: Warm, no lesions or rash      Assessment & Plan:  COPD (chronic obstructive pulmonary disease) ([place]) He has not felt much difference  with the change from Trelegy to [facility], okay to continue the [facility] regimen.  Rare albuterol use.  He has been walking which I think is been helpful.  Flu shot is up-to-date as his pneumonia vaccine.  Please continue Breo 1 inhalation once daily.  Rinse and gargle after using Keep albuterol available to use 2 puffs if needed for shortness of breath, chest tightness, wheezing. Glad to hear that you have been exercising.  Keep up your walking routine Follow with Dr Lamonte Sakai in 6 months or sooner if you have any problems  Pulmonary nodules Had been following these on low-dose screening CTs.  He has now aged out of the program but will have an interval CT chest to follow his aneurysm in 2 years.  No indication to perform any nodule follow-up any sooner than that.  We will review the scan together when available.  Baltazar Apo, MD, PhD 03/15/2019, 9:46 AM [facility] [phone] or if no answer [phone]

## 2019-03-15 NOTE — Assessment & Plan Note (Signed)
He has not felt much difference with the change from Trelegy to [facility], okay to continue the [facility] regimen.  Rare albuterol use.  He has been walking which I think is been helpful.  Flu shot is up-to-date as his pneumonia vaccine.  Please continue Breo 1 inhalation once daily.  Rinse and gargle after using Keep albuterol available to use 2 puffs if needed for shortness of breath, chest tightness, wheezing. Glad to hear that you have been exercising.  Keep up your walking routine Follow with Dr Lamonte Sakai in 6 months or sooner if you have any problems

## 2019-03-15 NOTE — Patient Instructions (Addendum)
Please continue Breo 1 inhalation once daily.  Rinse and gargle after using Keep albuterol available to use 2 puffs if needed for shortness of breath, chest tightness, wheezing. Glad to hear that you have been exercising.  Keep up your walking routine Get your repeat CT scan of the chest as planned by Dr. Cyndia Bent to follow your aneurysm. Follow with Dr Lamonte Sakai in 6 months or sooner if you have any problems

## 2019-03-15 NOTE — Assessment & Plan Note (Signed)
Had been following these on low-dose screening CTs.  He has now aged out of the program but will have an interval CT chest to follow his aneurysm in 2 years.  No indication to perform any nodule follow-up any sooner than that.  We will review the scan together when available.

## 2019-07-21 ENCOUNTER — Ambulatory Visit: Payer: Medicare HMO | Attending: Internal Medicine

## 2019-07-21 DIAGNOSIS — Z23 Encounter for immunization: Secondary | ICD-10-CM

## 2019-07-21 NOTE — Progress Notes (Signed)
   Covid-19 Vaccination Clinic  Name:  Carlos Olson    MRN: [id number] DOB: [birth year]  07/21/2019  [name] was observed post Covid-19 immunization for 15 minutes without incidence. He was provided with Vaccine Information Sheet and instruction to access the V-Safe system.   [name] was instructed to call 911 with any severe reactions post vaccine: Marland Kitchen Difficulty breathing  . Swelling of your face and throat  . A fast heartbeat  . A bad rash all over your body  . Dizziness and weakness    Immunizations Administered    Name Date Dose VIS Date Route   Pfizer COVID-19 Vaccine 07/21/2019  8:11 AM 0.3 mL 06/10/2019 Intramuscular   Manufacturer: [place]   Lot: [id number]   [place]: [id number]

## 2019-08-04 ENCOUNTER — Other Ambulatory Visit: Payer: Self-pay | Admitting: Family Medicine

## 2019-08-11 ENCOUNTER — Ambulatory Visit: Payer: Medicare HMO | Attending: Internal Medicine

## 2019-08-11 DIAGNOSIS — Z23 Encounter for immunization: Secondary | ICD-10-CM | POA: Insufficient documentation

## 2019-08-11 NOTE — Progress Notes (Signed)
   Covid-19 Vaccination Clinic  Name:  Carlos Olson    MRN: [id number] DOB: 05-08-1941  08/11/2019  [name] was observed post Covid-19 immunization for 15 minutes without incidence. He was provided with Vaccine Information Sheet and instruction to access the V-Safe system.   [name] was instructed to call 911 with any severe reactions post vaccine: Marland Kitchen Difficulty breathing  . Swelling of your face and throat  . A fast heartbeat  . A bad rash all over your body  . Dizziness and weakness    Immunizations Administered    Name Date Dose VIS Date Route   Pfizer COVID-19 Vaccine 08/11/2019  8:10 AM 0.3 mL 06/10/2019 Intramuscular   Manufacturer: [place]   Lot: [id number]   [place]: [id number]

## 2019-10-06 ENCOUNTER — Telehealth: Payer: Self-pay | Admitting: Family Medicine

## 2019-10-06 NOTE — Telephone Encounter (Signed)
Patient requested a call back He stated he would like to discuss where he should be referred Patient stated his is having trouble with sciatic nerve pain. Patient stated it is pain in his right leg and in his back, Patient stated he is having trouble walking now and sleeping.  He stated that Dr Darnell Level is aware of this and he has seen him before but because the pain is not improving he feels it is time to see a specialist . He is not sure where he should be referred to .

## 2019-10-06 NOTE — Telephone Encounter (Signed)
Spoke with pt relaying Dr. Synthia Innocent message.  Pt verbalizes understanding and thinks it was [facility] he went to previously for steroid inj.  Pt agreed to OV tomorrow @ 10:15.

## 2019-10-06 NOTE — Telephone Encounter (Signed)
Reviewed his chart. I have not seem him for this before.  Last lumbar MRI was 2012 (with Dr Council Mechanic). He saw Dr Noemi Chapel ortho for this before did he have steroid injections?  Would he want to return to [facility] clinic or we could do OV here as we may be able to start with treatment that may provide relief while we come up with more long term plan to manage back pain (which may including further imaging and specialist referral).

## 2019-10-07 ENCOUNTER — Other Ambulatory Visit: Payer: Self-pay | Admitting: *Deleted

## 2019-10-07 ENCOUNTER — Ambulatory Visit (INDEPENDENT_AMBULATORY_CARE_PROVIDER_SITE_OTHER): Payer: Medicare HMO | Admitting: Family Medicine

## 2019-10-07 ENCOUNTER — Ambulatory Visit (INDEPENDENT_AMBULATORY_CARE_PROVIDER_SITE_OTHER)
Admission: RE | Admit: 2019-10-07 | Discharge: 2019-10-07 | Disposition: A | Discharge status: Home / Self Care | Payer: Medicare HMO | Source: Ambulatory Visit | Attending: Family Medicine | Admitting: Family Medicine

## 2019-10-07 ENCOUNTER — Other Ambulatory Visit: Payer: Self-pay

## 2019-10-07 ENCOUNTER — Encounter: Payer: Self-pay | Admitting: Family Medicine

## 2019-10-07 VITALS — BP 122/60 | HR 77 | Temp 97.8°F | Ht 75.0 in | Wt 213.0 lb

## 2019-10-07 DIAGNOSIS — M545 Low back pain: Secondary | ICD-10-CM | POA: Diagnosis not present

## 2019-10-07 DIAGNOSIS — M2560 Stiffness of unspecified joint, not elsewhere classified: Secondary | ICD-10-CM | POA: Diagnosis not present

## 2019-10-07 DIAGNOSIS — G8929 Other chronic pain: Secondary | ICD-10-CM | POA: Diagnosis not present

## 2019-10-07 DIAGNOSIS — M5441 Lumbago with sciatica, right side: Secondary | ICD-10-CM

## 2019-10-07 MED ORDER — BREO ELLIPTA 100-25 MCG/INH IN AEPB: 1.0000 | INHALATION_SPRAY | Freq: Every day | RESPIRATORY_TRACT | 0 refills | Status: DC

## 2019-10-07 NOTE — Patient Instructions (Signed)
I think you have chronic lower back pain with acute R sciatica  Continue tylenol but max 1000mg  three times daily. Add aleve 220mg  twice daily with meals for 5 days.  Gentle stretching to the lower back.  xrays today We will refer you to PT If no better, next step is MRI and referral  To the spine clinic.

## 2019-10-07 NOTE — Progress Notes (Signed)
This visit was conducted in person.  BP 122/60 (BP Location: Left Arm, Patient Position: Sitting, Cuff Size: Normal)   Pulse 77   Temp 97.8 F (36.6 C) (Temporal)   Ht 6\' 3"  (1.905 m)   Wt 213 lb (96.6 kg)   SpO2 97%   BMI 26.62 kg/m    CC: back pain Subjective:    Patient ID: Carlos Olson, male    DOB: Nov 21, 1940, 79 y.o.   MRN: [id number]  HPI: Carlos Olson is a 79 y.o. male presenting on 10/07/2019 for Back Pain (C/o right side low back pain radiating down right leg.  H/o sciatca.  Wants to discuss referral. )   1+ yr h/o lower back pain, acutely worse with R sciatica present over the last 1.5 wks. Describes sharp pain that starts at lower back and radiates into L buttock into upper posterior L thigh. Denies numbness or tingling down leg. No saddle anesthesia. No bowel/bladder incontinence. No fevers.  Taking 6-10 ES tylenol/day - discussed max 6/day.  Also uses topical Stop Pain roll or other OTC equivalent.   Started walking 03/2019 - walking 1-2 miles regularly. Less so over winter. This helps knee joint pain.   Last lumbar MRI 2012, s/p eval by Dr Noemi Chapel s/p steroid injections.   Notes L>R hand tingling and cold sensation Notes increasing trouble with balance without dizziness or foot paresthesias.  Increasing shoulder pain, stiffness - trouble raising arms past 90 degrees.  Fatigues easily. Feels tired and weak.   He is now living closer to [facility] but plans to continue seeing our office.      Relevant past medical, surgical, family and social history reviewed and updated as indicated. Interim medical history since our last visit reviewed. Allergies and medications reviewed and updated. Outpatient Medications Prior to Visit  Medication Sig Dispense Refill  . acetaminophen (TYLENOL) 500 MG tablet Take 1,000 mg by mouth 3 (three) times daily as needed for moderate pain or headache.     . albuterol (VENTOLIN HFA) 108 (90 Base) MCG/ACT inhaler Inhale 2  puffs into the lungs every 6 (six) hours as needed for wheezing or shortness of breath. 1 Inhaler 3  . aspirin EC 81 MG tablet Take 1 tablet (81 mg total) by mouth daily.    . fluticasone furoate-vilanterol (BREO ELLIPTA) 100-25 MCG/INH AEPB Inhale 1 puff into the lungs daily. 30 each 0   No facility-administered medications prior to visit.     Per HPI unless specifically indicated in ROS section below Review of Systems Objective:    BP 122/60 (BP Location: Left Arm, Patient Position: Sitting, Cuff Size: Normal)   Pulse 77   Temp 97.8 F (36.6 C) (Temporal)   Ht 6\' 3"  (1.905 m)   Wt 213 lb (96.6 kg)   SpO2 97%   BMI 26.62 kg/m   Wt Readings from Last 3 Encounters:  10/07/19 213 lb (96.6 kg)  03/15/19 215 lb (97.5 kg)  01/27/19 213 lb 1 oz (96.6 kg)    Physical Exam Vitals and nursing note reviewed.  Constitutional:      Appearance: Normal appearance. He is not ill-appearing.  Musculoskeletal:        General: No tenderness.     Right lower leg: No edema.     Left lower leg: No edema.     Comments:  No pain midline spine No paraspinous mm tenderness Neg SLR bilaterally - but very tight. No pain with int/ext rotation at hip. Neg FABER.  No pain at SIJ, GTB bilaterally.  Tender at R sciatic notch Stiff movements throughout  Skin:    General: Skin is warm and dry.     Capillary Refill: Capillary refill takes less than 2 seconds.     Findings: No rash.  Neurological:     General: No focal deficit present.     Mental Status: He is alert.     Sensory: Sensation is intact.     Motor: Motor function is intact.     Coordination: Coordination is intact.     Gait: Gait is intact.     Deep Tendon Reflexes:     Reflex Scores:      Patellar reflexes are 1+ on the right side and 1+ on the left side.      Achilles reflexes are 0 on the right side and 0 on the left side.    Comments:  Diminished DTRs bilaterally No cogwheel rigidity  Stiff gait        DG Lumbar Spine  Complete CLINICAL DATA:  Chronic right lower back pain and right sciatica.  EXAM: LUMBAR SPINE - COMPLETE 4+ VIEW  COMPARISON:  None.  FINDINGS: No fracture or bone lesion.  Grade 1 retrolisthesis of L2 on L3 and L3 and L4. Mild curvature of the lumbar spine, convex the right, apex at L2.  Mild loss of disc height at L1-L2. Moderate loss of disc height from L2-L3 through L5-S1. Endplate osteophytes are noted throughout the lumbar spine.  Skeletal structures are diffusely demineralized.  There are multiple pelvic vascular clips consistent with previous prostate surgery. Soft tissues otherwise unremarkable.  IMPRESSION: 1. No fracture or acute finding. 2. Significant degenerative changes as detailed.  Electronically Signed   By: Lajean Manes M.D.   On: 10/07/2019 16:16   Assessment & Plan:  This visit occurred during the SARS-CoV-2 public health emergency.  Safety protocols were in place, including screening questions prior to the visit, additional usage of staff PPE, and extensive cleaning of exam room while observing appropriate contact time as indicated for disinfecting solutions.   Problem List Items Addressed This Visit    Stiffness of multiple joints    Consider PMR eval       Chronic back pain - Primary    Chronic back pain present for years, s/p lumbar MRI and ESI after eval by [facility] (2012). Now acutely worse over the past 10 days with R sided sciatica. On exam marked stiffness noted. Reviewed correct tyenol dosing, add short 5d course of aleve. Gentle stretching. Update lumbar films, refer to PT, if no improvement low threshold to update MRI and refer to spine clinic to consider repeat ESI.  Lumbar xray with signs of diffuse demineralization - ?osteopenia. Consider DEXA.       Relevant Orders   Ambulatory referral to Physical Therapy   DG Lumbar Spine Complete (Completed)       No orders of the defined types were placed in this encounter.  Orders Placed  This Encounter  Procedures  . DG Lumbar Spine Complete    Standing Status:   Future    Number of Occurrences:   1    Standing Expiration Date:   12/06/2020    Order Specific Question:   Reason for Exam (SYMPTOM  OR DIAGNOSIS REQUIRED)    Answer:   chronic lower back pain with acute R sciatica    Order Specific Question:   Preferred imaging location?    Answer:   [facility]  Order Specific Question:   Radiology Contrast Protocol - do NOT remove file path    Answer:   [email or web address]  . Ambulatory referral to Physical Therapy    Referral Priority:   Routine    Referral Type:   Physical Medicine    Referral Reason:   Specialty Services Required    Requested Specialty:   Physical Therapy    Number of Visits Requested:   1    Patient Instructions  I think you have chronic lower back pain with acute R sciatica  Continue tylenol but max 1000mg  three times daily. Add aleve 220mg  twice daily with meals for 5 days.  Gentle stretching to the lower back.  xrays today We will refer you to PT If no better, next step is MRI and referral  To the spine clinic.    Follow up plan: Return if symptoms worsen or fail to improve.  Ria Bush, MD

## 2019-10-10 DIAGNOSIS — M2560 Stiffness of unspecified joint, not elsewhere classified: Secondary | ICD-10-CM | POA: Insufficient documentation

## 2019-10-10 DIAGNOSIS — G8929 Other chronic pain: Secondary | ICD-10-CM | POA: Insufficient documentation

## 2019-10-10 NOTE — Assessment & Plan Note (Addendum)
Chronic back pain present for years, s/p lumbar MRI and ESI after eval by [facility] (2012). Now acutely worse over the past 10 days with R sided sciatica. On exam marked stiffness noted. Reviewed correct tyenol dosing, add short 5d course of aleve. Gentle stretching. Update lumbar films, refer to PT, if no improvement low threshold to update MRI and refer to spine clinic to consider repeat ESI.  Lumbar xray with signs of diffuse demineralization - ?osteopenia. Consider DEXA.

## 2019-10-10 NOTE — Assessment & Plan Note (Signed)
Consider PMR eval

## 2019-10-11 ENCOUNTER — Other Ambulatory Visit: Payer: Self-pay | Admitting: Family Medicine

## 2019-10-19 ENCOUNTER — Other Ambulatory Visit: Payer: Self-pay

## 2019-10-19 ENCOUNTER — Encounter: Payer: Self-pay | Admitting: Physical Therapy

## 2019-10-19 ENCOUNTER — Ambulatory Visit: Payer: Medicare HMO | Attending: Family Medicine | Admitting: Physical Therapy

## 2019-10-19 DIAGNOSIS — M5441 Lumbago with sciatica, right side: Secondary | ICD-10-CM | POA: Insufficient documentation

## 2019-10-19 DIAGNOSIS — M6281 Muscle weakness (generalized): Secondary | ICD-10-CM | POA: Insufficient documentation

## 2019-10-19 NOTE — Therapy (Signed)
[address], [place], [address] Phone: [phone]   Fax:  [phone]  Physical Therapy Evaluation  Patient Details  Name: Carlos Olson MRN: [id number] Date of Birth: Nov 27, 1940 Referring Provider (PT): Ria Bush   Encounter Date: 10/19/2019  PT End of Session - 10/19/19 1027    Visit Number  1    Date for PT Re-Evaluation  12/19/19    PT Start Time  0929    PT Stop Time  1015    PT Time Calculation (min)  46 min    Activity Tolerance  Patient tolerated treatment well    Behavior During Therapy  [facility] for tasks assessed/performed       Past Medical History:  Diagnosis Date  . Atherosclerosis of aortic arch ([place]) 09/25/2015   By CT scan   . CAD (coronary artery disease) 09/25/2015   Mild 3v by CT scan   . [facility])    prostate  . Colon polyps   . COPD (chronic obstructive pulmonary disease) ([place])   . Diverticulosis of colon   . Femoral bruit    bilateral, found by Dr. Jefm Bryant  . Heart murmur    child  . History of prostate cancer   . IBS (irritable bowel syndrome)    improved after retirement  . 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 Pelvic fracture ([place])   . Pneumonia    child  . Pulmonary nodules 09/25/2015   On screening CT scan, rpt 1 yr   . Shortness of breath    doe    Past Surgical History:  Procedure Laterality Date  . ANTERIOR CERVICAL DECOMP/DISCECTOMY FUSION  07/29/2011   Procedure: ANTERIOR CERVICAL DECOMPRESSION/DISCECTOMY FUSION 1 LEVEL;  Surgeon: Jessy Oto, MD;  Location: [place];  Service: Orthopedics;  Laterality: N/A;  Anterior Cervical Discectomy and Fusion C3-4  . CATARACT EXTRACTION W/PHACO Right 07/10/2015   Procedure: CATARACT EXTRACTION PHACO AND INTRAOCULAR LENS PLACEMENT (IOC);  Surgeon: Birder Robson, MD;  Location: [facility];  Service: Ophthalmology;  Laterality: Right;  [place] 00:58   . CATARACT  EXTRACTION W/PHACO Left 07/24/2015   Procedure: CATARACT EXTRACTION PHACO AND INTRAOCULAR LENS PLACEMENT (IOC);  Surgeon: Birder Robson, MD;  Location: [facility];  Service: Ophthalmology;  Laterality: Left;  [place] 00:38   . COLONOSCOPY  12/2007   [place], diverticulosis and polyp, rpt 5 yrs  . dental implants    . FORAMINOTOMY 2 LEVEL Left 10/2013   C4/5, C5/6 ([place])  . HERNIA REPAIR     right 05/1999, left 09/04/05  . JOINT REPLACEMENT Right    TKR  . KNEE ARTHROPLASTY Left 05/20/2017   Procedure: COMPUTER ASSISTED TOTAL KNEE ARTHROPLASTY;  Surgeon: Dereck Leep, MD;  Location: [facility];  Service: Orthopedics;  Laterality: Left;  . KNEE ARTHROSCOPY  09/1997   left  . POSTERIOR CERVICAL FUSION/FORAMINOTOMY N/A 11/22/2013   Procedure: LEFT C4-5 AND C5-6 FORAMINOTOMY;  Surgeon: Jessy Oto, MD;  Location: [place];  Service: Orthopedics;  Laterality: N/A;  . PROSTATECTOMY  08/1990  . 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 TONSILLECTOMY      There were no vitals filed for this visit.   Subjective Assessment - 10/19/19 0931    Subjective  Pt reports longstanding LBP since the 1960s. Pt reports that he used to alleviate pain with ibuprofen, steroid injections, rest, and heat. Pt states that those those things no longer  help; he is now experiencing radiating pain down RLE. Pt states pain has been getting progressively better since beginning 2-3 weeks ago. Pt would like to get back to walking program and decrease pain in LB.    Pertinent History  arthritis, TKA B >5 years prior, COPD    Limitations  Sitting;Lifting;Standing;Walking    How long can you sit comfortably?  30 minutes    How long can you stand comfortably?  20 minutes    How long can you walk comfortably?  30 minutes - used to walk 2 miles daily    Patient Stated Goals  get back to walking program, decrease pain    Currently in Pain?  Yes    Pain Score  2     Pain Location  Back    Pain Orientation  Right;Lower    Pain  Descriptors / Indicators  Aching;Sharp;Radiating    Pain Type  Acute pain;Chronic pain    Pain Radiating Towards  proximal R thigh (posteriorly)    Pain Onset  1 to 4 weeks ago   LBP chronic (decades)   Aggravating Factors   bending, walking, standing, prolonged sitting    Pain Relieving Factors  heat, ibuprofen, rest, aleve         [facility] PT Assessment - 10/19/19 0001      Assessment   Medical Diagnosis  LBP with radiating to RLE    Referring Provider (PT)  Ria Bush    Onset Date/Surgical Date  09/28/18    Prior Therapy  PT for TKA      Precautions   Precautions  None      Restrictions   Weight Bearing Restrictions  No      Balance Screen   Has the patient fallen in the past 6 months  No    Has the patient had a decrease in activity level because of a fear of falling?   No    Is the patient reluctant to leave their home because of a fear of falling?   No      Home Film/video editor residence    Living Arrangements  Spouse/significant other    Available Help at Discharge  Family    Type of [place]      Prior Function   Level of [place]  Retired    Leisure  walking      ROM / Strength   AROM / PROM / Strength  AROM;Strength      AROM   Overall AROM Comments  lumar AROM decreased by 50% in flexion, extension, SB and rotation R>L      Strength   Strength Assessment Site  Hip;Knee    Right/Left Hip  Right;Left    Right Hip Flexion  4-/5    Right Hip Extension  3-/5    Right Hip ABduction  3+/5    Left Hip Flexion  4-/5    Left Hip Extension  3-/5    Left Hip ABduction  4-/5    Right/Left Knee  Right;Left    Right Knee Flexion  5/5    Right Knee Extension  5/5    Left Knee Flexion  5/5    Left Knee Extension  5/5      Flexibility   Soft Tissue Assessment /Muscle Length  yes    Hamstrings  40 deg R, 50 deg L (limited and painful R>L)    Quadriceps  limited  bilaterally R>L    Piriformis   limited bilat R>L, painful on R      Palpation   Palpation comment  tender to palpation R glute/piriformis      Special Tests   Other special tests  hypomobility of lumbar spine      Ambulation/Gait   Ambulation/Gait  Yes    Ambulation/Gait Assistance  7: Independent    Gait Pattern  Decreased arm swing - right;Decreased arm swing - left;Decreased step length - right;Decreased step length - left;Decreased hip/knee flexion - right;Decreased hip/knee flexion - left   balance/instability noted with turns and transitional moveme               Objective measurements completed on examination: See above findings.      [facility] Adult PT Treatment/Exercise - 10/19/19 0001      Exercises   Exercises  Lumbar      Lumbar Exercises: Stretches   Active Hamstring Stretch  Right;Left;30 seconds   seated with cues for form   Quad Stretch  Right;Left;30 seconds   standing with LE propped on chair     Lumbar Exercises: Supine   Bridge  10 reps;3 seconds             PT Education - 10/19/19 1026    Education Details  Pt educated on HEP, rehabilitation process, and condition    Person(s) Educated  Patient    Methods  Explanation;Demonstration    Comprehension  Verbalized understanding       PT Short Term Goals - 10/19/19 1032      PT SHORT TERM GOAL #1   Title  Pt will be independent with HEP    Time  2    Period  Weeks    Status  New    Target Date  11/02/19        PT Long Term Goals - 10/19/19 1032      PT LONG TERM GOAL #1   Title  Pt will demonstrate increase in bilat hamstring length to 90 deg or greater in order to decrease pain and increase functional ROM    Time  8    Period  Weeks    Status  New    Target Date  12/14/19      PT LONG TERM GOAL #2   Title  Pt will increase lumbar extension ROM to <25% limited in order to decrease pain and improve functional abilities    Time  8    Period  Weeks    Status  New    Target Date  12/14/19      PT LONG  TERM GOAL #3   Title  Pt will increase hip abduction and hip extension strength to 4/5 in order to facilitate return to pain free ADLs    Time  8    Period  Weeks    Status  New    Target Date  12/14/19      PT LONG TERM GOAL #4   Title  Pt will report ability to walk >30 min with no increase in LBP    Time  8    Period  Weeks    Status  New    Target Date  12/14/19      PT LONG TERM GOAL #5   Title  Pt will report resolution of radiating pain down RLE    Time  8    Period  Weeks    Status  New  Target Date  12/14/19             Plan - 10/19/19 1027    Clinical Impression Statement  Pt presents to clinic with reports of acute R LBP with radiating pain posteriorly down RLE and hx of chronic LBP bilaterally. Pt demonstrates decreased lumbar ROM, decreased flexibility of hamstrings bilat R>L, decreased LE strength R>L (notably hip flexor/extensor/abductors), and tenderness to palpation in R glute/piriformis.    Personal Factors and Comorbidities  Age;Comorbidity 1    Comorbidities  COPD    Examination-Activity Limitations  Bend;Carry;Lift;Sit;Squat;Stand    Examination-Participation Restrictions  Cleaning;Community Activity    Stability/Clinical Decision Making  Stable/Uncomplicated    Clinical Decision Making  Low    Rehab Potential  Good    PT Frequency  2x / week    PT Duration  8 weeks    PT Treatment/Interventions  ADLs/Self Care Home Management;Electrical Stimulation;Iontophoresis 4mg /ml Dexamethasone;Moist Heat;Gait training;Stair training;Functional mobility training;Therapeutic activities;Therapeutic exercise;Balance training;Neuromuscular re-education;Manual techniques;Patient/family education;Passive range of motion    PT Next Visit Plan  Initiate LE strengthening and flexibility ex's, review/progress HEP, manual as indicated    PT Home Exercise Plan  seated hamstring stretch, standing quad stretch, supine bridges    Consulted and Agree with Plan of Care  Patient        Patient will benefit from skilled therapeutic intervention in order to improve the following deficits and impairments:  Abnormal gait, Decreased range of motion, Difficulty walking, Decreased endurance, Decreased activity tolerance, Pain, Decreased balance, Hypomobility, Impaired flexibility, Decreased mobility  Visit Diagnosis: Muscle weakness (generalized)  Acute right-sided low back pain with right-sided sciatica     Problem List Patient Active Problem List   Diagnosis Date Noted  . Stiffness of multiple joints 10/10/2019  . Carotid stenosis, asymptomatic, bilateral 02/06/2019  . Vertigo 01/28/2019  . Abrasion of nose 04/23/2018  . Thoracic ascending aortic aneurysm ([place]) 10/26/2017  . S/P total knee arthroplasty 05/20/2017  . Osteoarthritis 09/22/2016  . Pulmonary nodules 09/25/2015  . CAD (coronary artery disease) 09/25/2015  . Atherosclerosis of aortic arch ([place]) 09/25/2015  . [place] 08/17/2015  . Advanced care planning/counseling discussion 09/08/2014  . Health maintenance examination 09/08/2014  . 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 Spinal stenosis in cervical region 11/22/2013  . Medicare annual wellness visit, subsequent 06/22/2012  . Cervical stenosis of spinal canal 07/29/2011    Class: Chronic  . SHOULDER PAIN, RIGHT 04/18/2010  . History of tobacco abuse 12/02/2006  . COPD (chronic obstructive pulmonary disease) ([place]) 12/01/2006  . DIVERTICULOSIS, COLON 12/01/2006  . Chronic back pain 12/01/2006  . PROSTATE CANCER, HX OF 12/01/2006   Amador Cunas, PT, DPT Donald Prose Chela [place] 10/19/2019, 10:38 AM  [address] [place], [place], [address] Phone: [phone]   Fax:  [phone]  Name: KESTUTIS THORMAHLEN MRN: [id number] Date of Birth: [birth year]

## 2019-10-25 ENCOUNTER — Encounter: Payer: Self-pay | Admitting: Physical Therapy

## 2019-10-25 ENCOUNTER — Ambulatory Visit: Payer: Medicare HMO | Admitting: Physical Therapy

## 2019-10-25 ENCOUNTER — Other Ambulatory Visit: Payer: Self-pay

## 2019-10-25 DIAGNOSIS — M6281 Muscle weakness (generalized): Secondary | ICD-10-CM | POA: Diagnosis not present

## 2019-10-25 DIAGNOSIS — M5441 Lumbago with sciatica, right side: Secondary | ICD-10-CM | POA: Diagnosis not present

## 2019-10-25 NOTE — Therapy (Signed)
[address] [place], [place], [address] Phone: [phone]   Fax:  [phone]  Physical Therapy Treatment  Patient Details  Name: Carlos Olson MRN: [id number] Date of Birth: 06-04-1941 Referring Provider (PT): Ria Bush   Encounter Date: 10/25/2019  PT End of Session - 10/25/19 0844    Visit Number  2    Date for PT Re-Evaluation  12/19/19    PT Start Time  0800    PT Stop Time  0847    PT Time Calculation (min)  47 min    Activity Tolerance  Patient tolerated treatment well    Behavior During Therapy  [facility] for tasks assessed/performed       Past Medical History:  Diagnosis Date  . Atherosclerosis of aortic arch ([place]) 09/25/2015   By CT scan   . CAD (coronary artery disease) 09/25/2015   Mild 3v by CT scan   . Cancer [facility])    prostate  . Colon polyps   . COPD (chronic obstructive pulmonary disease) ([place])   . Diverticulosis of colon   . Femoral bruit    bilateral, found by Dr. Jefm Bryant  . Heart murmur    child  . History of prostate cancer   . IBS (irritable bowel syndrome)    improved after retirement  . 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 Pelvic fracture ([place])   . Pneumonia    child  . Pulmonary nodules 09/25/2015   On screening CT scan, rpt 1 yr   . Shortness of breath    doe    Past Surgical History:  Procedure Laterality Date  . ANTERIOR CERVICAL DECOMP/DISCECTOMY FUSION  07/29/2011   Procedure: ANTERIOR CERVICAL DECOMPRESSION/DISCECTOMY FUSION 1 LEVEL;  Surgeon: Jessy Oto, MD;  Location: [place];  Service: Orthopedics;  Laterality: N/A;  Anterior Cervical Discectomy and Fusion C3-4  . CATARACT EXTRACTION W/PHACO Right 07/10/2015   Procedure: CATARACT EXTRACTION PHACO AND INTRAOCULAR LENS PLACEMENT (IOC);  Surgeon: Birder Robson, MD;  Location: [facility];  Service: Ophthalmology;  Laterality: Right;  [place] 00:58   . CATARACT  EXTRACTION W/PHACO Left 07/24/2015   Procedure: CATARACT EXTRACTION PHACO AND INTRAOCULAR LENS PLACEMENT (IOC);  Surgeon: Birder Robson, MD;  Location: [facility];  Service: Ophthalmology;  Laterality: Left;  [place] 00:38   . COLONOSCOPY  12/2007   Medhoff, diverticulosis and polyp, rpt 5 yrs  . dental implants    . FORAMINOTOMY 2 LEVEL Left 10/2013   C4/5, C5/6 ([place])  . HERNIA REPAIR     right 05/1999, left 09/04/05  . JOINT REPLACEMENT Right    TKR  . KNEE ARTHROPLASTY Left 05/20/2017   Procedure: COMPUTER ASSISTED TOTAL KNEE ARTHROPLASTY;  Surgeon: Dereck Leep, MD;  Location: [facility];  Service: Orthopedics;  Laterality: Left;  . KNEE ARTHROSCOPY  09/1997   left  . POSTERIOR CERVICAL FUSION/FORAMINOTOMY N/A 11/22/2013   Procedure: LEFT C4-5 AND C5-6 FORAMINOTOMY;  Surgeon: Jessy Oto, MD;  Location: [place];  Service: Orthopedics;  Laterality: N/A;  . PROSTATECTOMY  08/1990  . 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 TONSILLECTOMY      There were no vitals filed for this visit.  Subjective Assessment - 10/25/19 0805    Subjective  Pt reports 2/10 LBP this morning and reports that he has not experienced radiating pain since time of eval. Pt reports compliance with HEP.    Currently in Pain?  Yes    Pain Score  2     Pain Location  Back    Pain Orientation  Right;Lower                       [facility] Adult PT Treatment/Exercise - 10/25/19 0001      Exercises   Exercises  Neck      Neck Exercises: Machines for Strengthening   Cybex Row  25# 1x10, 35# 1x10    Lat Pull  35# 2x10      Lumbar Exercises: Stretches   Passive Hamstring Stretch  Right;Left;1 rep;60 seconds    Passive Hamstring Stretch Limitations  very limited in hamstring ROM B    Prone on Elbows Stretch  5 reps;10 seconds    Prone on Elbows Stretch Limitations  cues for form    Quad Stretch  Right;Left;1 rep;60 seconds    Quad Stretch Limitations  prone      Lumbar Exercises: Aerobic   Nustep  L5 x  6 min      Lumbar Exercises: Machines for Strengthening   Leg Press  40# 1x10 BLE, 1x10 LLE 1x10, RLE    Other Lumbar Machine Exercise  1x15 heel raise 20#      Lumbar Exercises: Supine   Bridge  10 reps;3 seconds    Bridge with Ball Squeeze  10 reps;3 seconds               PT Short Term Goals - 10/19/19 1032      PT SHORT TERM GOAL #1   Title  Pt will be independent with HEP    Time  2    Period  Weeks    Status  New    Target Date  11/02/19        PT Long Term Goals - 10/19/19 1032      PT LONG TERM GOAL #1   Title  Pt will demonstrate increase in bilat hamstring length to 90 deg or greater in order to decrease pain and increase functional ROM    Time  8    Period  Weeks    Status  New    Target Date  12/14/19      PT LONG TERM GOAL #2   Title  Pt will increase lumbar extension ROM to <25% limited in order to decrease pain and improve functional abilities    Time  8    Period  Weeks    Status  New    Target Date  12/14/19      PT LONG TERM GOAL #3   Title  Pt will increase hip abduction and hip extension strength to 4/5 in order to facilitate return to pain free ADLs    Time  8    Period  Weeks    Status  New    Target Date  12/14/19      PT LONG TERM GOAL #4   Title  Pt will report ability to walk >30 min with no increase in LBP    Time  8    Period  Weeks    Status  New    Target Date  12/14/19      PT LONG TERM GOAL #5   Title  Pt will report resolution of radiating pain down RLE    Time  8    Period  Weeks    Status  New    Target Date  12/14/19  Plan - 10/25/19 0847    Clinical Impression Statement  Pt tolerated progression to TE well. Rest breaks required in between sets with d/t COPD; O2 ranged between 93-96% during session. Pt reports 96% O2 as normal. O2 recovered after ~30 seconds of pursed lip breathing post exercise. Continue to progress lumbar ROM/flexibility.    PT Treatment/Interventions  ADLs/Self Care Home  Management;Electrical Stimulation;Iontophoresis 4mg /ml Dexamethasone;Moist Heat;Gait training;Stair training;Functional mobility training;Therapeutic activities;Therapeutic exercise;Balance training;Neuromuscular re-education;Manual techniques;Patient/family education;Passive range of motion    PT Next Visit Plan  Progress LE strengthening and flexibility ex's, review/progress HEP, manual as indicated    PT Home Exercise Plan  seated hamstring stretch, standing quad stretch, supine bridges    Consulted and Agree with Plan of Care  Patient       Patient will benefit from skilled therapeutic intervention in order to improve the following deficits and impairments:  Abnormal gait, Decreased range of motion, Difficulty walking, Decreased endurance, Decreased activity tolerance, Pain, Decreased balance, Hypomobility, Impaired flexibility, Decreased mobility  Visit Diagnosis: Muscle weakness (generalized)  Acute right-sided low back pain with right-sided sciatica     Problem List Patient Active Problem List   Diagnosis Date Noted  . Stiffness of multiple joints 10/10/2019  . Carotid stenosis, asymptomatic, bilateral 02/06/2019  . Vertigo 01/28/2019  . Abrasion of nose 04/23/2018  . Thoracic ascending aortic aneurysm ([place]) 10/26/2017  . S/P total knee arthroplasty 05/20/2017  . Osteoarthritis 09/22/2016  . Pulmonary nodules 09/25/2015  . CAD (coronary artery disease) 09/25/2015  . Atherosclerosis of aortic arch ([place]) 09/25/2015  . Verruca 08/17/2015  . Advanced care planning/counseling discussion 09/08/2014  . Health maintenance examination 09/08/2014  . 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 Spinal stenosis in cervical region 11/22/2013  . Medicare annual wellness visit, subsequent 06/22/2012  . Cervical stenosis of spinal canal 07/29/2011    Class: Chronic  . SHOULDER PAIN, RIGHT 04/18/2010  . History of tobacco abuse 12/02/2006  . COPD (chronic  obstructive pulmonary disease) ([place]) 12/01/2006  . DIVERTICULOSIS, COLON 12/01/2006  . Chronic back pain 12/01/2006  . PROSTATE CANCER, HX OF 12/01/2006   Amador Cunas, PT, DPT [facility] 10/25/2019, 8:50 AM  [address] [place], [place], [address] Phone: [phone]   Fax:  [phone]  Name: Carlos Olson MRN: [id number] Date of Birth: 1941-02-05

## 2019-10-27 ENCOUNTER — Other Ambulatory Visit: Payer: Self-pay

## 2019-10-27 ENCOUNTER — Ambulatory Visit: Payer: Medicare HMO | Admitting: Physical Therapy

## 2019-10-27 ENCOUNTER — Encounter: Payer: Self-pay | Admitting: Physical Therapy

## 2019-10-27 DIAGNOSIS — M5441 Lumbago with sciatica, right side: Secondary | ICD-10-CM | POA: Diagnosis not present

## 2019-10-27 DIAGNOSIS — M6281 Muscle weakness (generalized): Secondary | ICD-10-CM | POA: Diagnosis not present

## 2019-10-27 NOTE — Therapy (Signed)
[address] [place], [place], [address] Phone: [phone]   Fax:  [phone]  Physical Therapy Treatment  Patient Details  Name: Carlos Olson MRN: [id number] Date of Birth: 08-16-1940 Referring Provider (PT): Ria Bush   Encounter Date: 10/27/2019  PT End of Session - 10/27/19 0840    Visit Number  3    Date for PT Re-Evaluation  12/19/19    PT Start Time  0800    PT Stop Time  0845    PT Time Calculation (min)  45 min    Activity Tolerance  Patient tolerated treatment well    Behavior During Therapy  [facility] for tasks assessed/performed       Past Medical History:  Diagnosis Date  . Atherosclerosis of aortic arch ([place]) 09/25/2015   By CT scan   . CAD (coronary artery disease) 09/25/2015   Mild 3v by CT scan   . Cancer [facility])    prostate  . Colon polyps   . COPD (chronic obstructive pulmonary disease) ([place])   . Diverticulosis of colon   . Femoral bruit    bilateral, found by Dr. Jefm Bryant  . Heart murmur    child  . History of prostate cancer   . IBS (irritable bowel syndrome)    improved after retirement  . 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 Pelvic fracture ([place])   . Pneumonia    child  . Pulmonary nodules 09/25/2015   On screening CT scan, rpt 1 yr   . Shortness of breath    doe    Past Surgical History:  Procedure Laterality Date  . ANTERIOR CERVICAL DECOMP/DISCECTOMY FUSION  07/29/2011   Procedure: ANTERIOR CERVICAL DECOMPRESSION/DISCECTOMY FUSION 1 LEVEL;  Surgeon: Jessy Oto, MD;  Location: [place];  Service: Orthopedics;  Laterality: N/A;  Anterior Cervical Discectomy and Fusion C3-4  . CATARACT EXTRACTION W/PHACO Right 07/10/2015   Procedure: CATARACT EXTRACTION PHACO AND INTRAOCULAR LENS PLACEMENT (IOC);  Surgeon: Birder Robson, MD;  Location: [facility];  Service: Ophthalmology;  Laterality: Right;  [place] 00:58   . CATARACT  EXTRACTION W/PHACO Left 07/24/2015   Procedure: CATARACT EXTRACTION PHACO AND INTRAOCULAR LENS PLACEMENT (IOC);  Surgeon: Birder Robson, MD;  Location: [facility];  Service: Ophthalmology;  Laterality: Left;  [place] 00:38   . COLONOSCOPY  12/2007   Medhoff, diverticulosis and polyp, rpt 5 yrs  . dental implants    . FORAMINOTOMY 2 LEVEL Left 10/2013   C4/5, C5/6 ([place])  . HERNIA REPAIR     right 05/1999, left 09/04/05  . JOINT REPLACEMENT Right    TKR  . KNEE ARTHROPLASTY Left 05/20/2017   Procedure: COMPUTER ASSISTED TOTAL KNEE ARTHROPLASTY;  Surgeon: Dereck Leep, MD;  Location: [facility];  Service: Orthopedics;  Laterality: Left;  . KNEE ARTHROSCOPY  09/1997   left  . POSTERIOR CERVICAL FUSION/FORAMINOTOMY N/A 11/22/2013   Procedure: LEFT C4-5 AND C5-6 FORAMINOTOMY;  Surgeon: Jessy Oto, MD;  Location: [place];  Service: Orthopedics;  Laterality: N/A;  . PROSTATECTOMY  08/1990  . 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 TONSILLECTOMY      There were no vitals filed for this visit.  Subjective Assessment - 10/27/19 0802    Subjective  Pt reports that he has had a couple of instances of radiating RLE pain since last rx; occurs in the morning and resolves by the time he is done with his  coffee. Pt reports he was a little sore after last tx.    Pertinent History  arthritis, TKA B >5 years prior, COPD    Pain Score  2     Pain Location  Back    Pain Orientation  Right;Lower    Pain Descriptors / Indicators  Radiating;Aching                       [facility] Adult PT Treatment/Exercise - 10/27/19 0001      Lumbar Exercises: Stretches   Passive Hamstring Stretch  Right;Left;1 rep;60 seconds    Passive Hamstring Stretch Limitations  very limited in hamstring ROM B    Lower Trunk Rotation  5 reps;10 seconds    Pelvic Tilt  5 reps;5 seconds    Pelvic Tilt Limitations  hooklying    Standing Extension  10 reps;5 seconds    Standing Extension Limitations  extension over blue  exercise ball x2 sets    Prone on Elbows Stretch  5 reps;10 seconds    Prone on Elbows Stretch Limitations  cues for form    Quad Stretch  Right;Left;1 rep;60 seconds    Quad Stretch Limitations  prone; passive ROM      Lumbar Exercises: Aerobic   Nustep  L5 x 6 min      Lumbar Exercises: Seated   Other Seated Lumbar Exercises  seated forward/side lumbar flexion with exercise ball; hold 5 seconds x4 each direction      Lumbar Exercises: Supine   Bridge  10 reps;3 seconds    Bridge Limitations  with PPT      Manual Therapy   Manual Therapy  Soft tissue mobilization;Passive ROM;Joint mobilization    Joint Mobilization  Grade III to lumbar spine     Soft tissue mobilization  STM to R piriformis     Passive ROM  PROM to hamstring/quad               PT Short Term Goals - 10/19/19 1032      PT SHORT TERM GOAL #1   Title  Pt will be independent with HEP    Time  2    Period  Weeks    Status  New    Target Date  11/02/19        PT Long Term Goals - 10/19/19 1032      PT LONG TERM GOAL #1   Title  Pt will demonstrate increase in bilat hamstring length to 90 deg or greater in order to decrease pain and increase functional ROM    Time  8    Period  Weeks    Status  New    Target Date  12/14/19      PT LONG TERM GOAL #2   Title  Pt will increase lumbar extension ROM to <25% limited in order to decrease pain and improve functional abilities    Time  8    Period  Weeks    Status  New    Target Date  12/14/19      PT LONG TERM GOAL #3   Title  Pt will increase hip abduction and hip extension strength to 4/5 in order to facilitate return to pain free ADLs    Time  8    Period  Weeks    Status  New    Target Date  12/14/19      PT LONG TERM GOAL #4   Title  Pt will report ability  to walk >30 min with no increase in LBP    Time  8    Period  Weeks    Status  New    Target Date  12/14/19      PT LONG TERM GOAL #5   Title  Pt will report resolution of radiating  pain down RLE    Time  8    Period  Weeks    Status  New    Target Date  12/14/19            Plan - 10/27/19 0841    Clinical Impression Statement  Focused on lumbar/LE flexibility today; pt demonstrates significant tightness of B hamstrings, quads, and lumbar spine. Pt tolerated ex's well. Continue to progress LE/lumbar strengthening and flexibility ex's next rx. Be mindful of pt positioning during tx; pt ability to tolerate supine ex's limited d/t COPD.    Comorbidities  COPD    PT Treatment/Interventions  ADLs/Self Care Home Management;Electrical Stimulation;Iontophoresis 4mg /ml Dexamethasone;Moist Heat;Gait training;Stair training;Functional mobility training;Therapeutic activities;Therapeutic exercise;Balance training;Neuromuscular re-education;Manual techniques;Patient/family education;Passive range of motion    PT Next Visit Plan  Progress LE strengthening and flexibility ex's, review/progress HEP, manual as indicated    PT Home Exercise Plan  seated hamstring stretch, standing quad stretch, supine bridges    Consulted and Agree with Plan of Care  Patient       Patient will benefit from skilled therapeutic intervention in order to improve the following deficits and impairments:     Visit Diagnosis: Muscle weakness (generalized)  Acute right-sided low back pain with right-sided sciatica     Problem List Patient Active Problem List   Diagnosis Date Noted  . Stiffness of multiple joints 10/10/2019  . Carotid stenosis, asymptomatic, bilateral 02/06/2019  . Vertigo 01/28/2019  . Abrasion of nose 04/23/2018  . Thoracic ascending aortic aneurysm ([place]) 10/26/2017  . S/P total knee arthroplasty 05/20/2017  . Osteoarthritis 09/22/2016  . Pulmonary nodules 09/25/2015  . CAD (coronary artery disease) 09/25/2015  . Atherosclerosis of aortic arch ([place]) 09/25/2015  . Verruca 08/17/2015  . Advanced care planning/counseling discussion 09/08/2014  . Health maintenance examination  09/08/2014  . 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 Spinal stenosis in cervical region 11/22/2013  . Medicare annual wellness visit, subsequent 06/22/2012  . Cervical stenosis of spinal canal 07/29/2011    Class: Chronic  . SHOULDER PAIN, RIGHT 04/18/2010  . History of tobacco abuse 12/02/2006  . COPD (chronic obstructive pulmonary disease) ([place]) 12/01/2006  . DIVERTICULOSIS, COLON 12/01/2006  . Chronic back pain 12/01/2006  . PROSTATE CANCER, HX OF 12/01/2006   Amador Cunas, PT, DPT [facility] [place] 10/27/2019, 8:44 AM  [address] [place] [place], [place], [address] Phone: [phone]   Fax:  [phone]  Name: Carlos Olson MRN: [id number] Date of Birth: Sep 19, 1940

## 2019-10-31 ENCOUNTER — Other Ambulatory Visit: Payer: Self-pay | Admitting: Emergency Medicine

## 2019-10-31 NOTE — Telephone Encounter (Signed)
I called pt but there was no answer. I want to verify which pharmacy he wants the Rx to be sent to. Will try again.

## 2019-11-01 ENCOUNTER — Ambulatory Visit: Payer: Medicare HMO | Attending: Family Medicine | Admitting: Physical Therapy

## 2019-11-01 ENCOUNTER — Other Ambulatory Visit: Payer: Self-pay

## 2019-11-01 ENCOUNTER — Encounter: Payer: Self-pay | Admitting: Physical Therapy

## 2019-11-01 DIAGNOSIS — M6281 Muscle weakness (generalized): Secondary | ICD-10-CM | POA: Diagnosis not present

## 2019-11-01 DIAGNOSIS — M5441 Lumbago with sciatica, right side: Secondary | ICD-10-CM

## 2019-11-01 NOTE — Therapy (Signed)
[facility] [address] [place], [place], [address] Phone: [phone]   Fax:  [phone]  Physical Therapy Treatment  Patient Details  Name: Carlos Olson MRN: [id number] Date of Birth: Jan 16, 1941 Referring Provider (PT): Ria Bush   Encounter Date: 11/01/2019  PT End of Session - 11/01/19 0926    Visit Number  4    Date for PT Re-Evaluation  12/19/19    PT Start Time  0800    PT Stop Time  0845    PT Time Calculation (min)  45 min    Activity Tolerance  Patient tolerated treatment well    Behavior During Therapy  [facility] for tasks assessed/performed       Past Medical History:  Diagnosis Date  . Atherosclerosis of aortic arch ([place]) 09/25/2015   By CT scan   . CAD (coronary artery disease) 09/25/2015   Mild 3v by CT scan   . Cancer [facility])    prostate  . Colon polyps   . COPD (chronic obstructive pulmonary disease) ([place])   . Diverticulosis of colon   . Femoral bruit    bilateral, found by Dr. Jefm Bryant  . Heart murmur    child  . History of prostate cancer   . IBS (irritable bowel syndrome)    improved after retirement  . 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 Pelvic fracture ([place])   . Pneumonia    child  . Pulmonary nodules 09/25/2015   On screening CT scan, rpt 1 yr   . Shortness of breath    doe    Past Surgical History:  Procedure Laterality Date  . ANTERIOR CERVICAL DECOMP/DISCECTOMY FUSION  07/29/2011   Procedure: ANTERIOR CERVICAL DECOMPRESSION/DISCECTOMY FUSION 1 LEVEL;  Surgeon: Jessy Oto, MD;  Location: [place];  Service: Orthopedics;  Laterality: N/A;  Anterior Cervical Discectomy and Fusion C3-4  . CATARACT EXTRACTION W/PHACO Right 07/10/2015   Procedure: CATARACT EXTRACTION PHACO AND INTRAOCULAR LENS PLACEMENT (IOC);  Surgeon: Birder Robson, MD;  Location: [facility];  Service: Ophthalmology;  Laterality: Right;  [place] 00:58   . CATARACT  EXTRACTION W/PHACO Left 07/24/2015   Procedure: CATARACT EXTRACTION PHACO AND INTRAOCULAR LENS PLACEMENT (IOC);  Surgeon: Birder Robson, MD;  Location: [facility];  Service: Ophthalmology;  Laterality: Left;  [place] 00:38   . COLONOSCOPY  12/2007   Medhoff, diverticulosis and polyp, rpt 5 yrs  . dental implants    . FORAMINOTOMY 2 LEVEL Left 10/2013   C4/5, C5/6 ([place]ka)  . HERNIA REPAIR     right 05/1999, left 09/04/05  . JOINT REPLACEMENT Right    TKR  . KNEE ARTHROPLASTY Left 05/20/2017   Procedure: COMPUTER ASSISTED TOTAL KNEE ARTHROPLASTY;  Surgeon: Dereck Leep, MD;  Location: [facility];  Service: Orthopedics;  Laterality: Left;  . KNEE ARTHROSCOPY  09/1997   left  . POSTERIOR CERVICAL FUSION/FORAMINOTOMY N/A 11/22/2013   Procedure: LEFT C4-5 AND C5-6 FORAMINOTOMY;  Surgeon: Jessy Oto, MD;  Location: [place];  Service: Orthopedics;  Laterality: N/A;  . PROSTATECTOMY  08/1990  . 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 TONSILLECTOMY      There were no vitals filed for this visit.  Subjective Assessment - 11/01/19 0805    Subjective  Pt reports his radiating pain has been flaring up over the past few days; pt states that he might have been "overdoing it" between his knee and back exercises.  Pertinent History  arthritis, TKA B >5 years prior, COPD    Patient Stated Goals  get back to walking program, decrease pain    Currently in Pain?  Yes    Pain Score  3     Pain Location  Back    Pain Orientation  Right;Lower    Pain Descriptors / Indicators  Radiating    Pain Type  Acute pain;Chronic pain    Pain Radiating Towards  proximal R thigh posteriorly                       [facility] Adult PT Treatment/Exercise - 11/01/19 0001      Lumbar Exercises: Stretches   Piriformis Stretch  Right;Left;1 rep;60 seconds    Piriformis Stretch Limitations  supine; very limited      Lumbar Exercises: Aerobic   Nustep  L5 x 8 min      Lumbar Exercises: Standing   Other Standing  Lumbar Exercises  lumbar extension at counter x 10 3 sec hold    Other Standing Lumbar Exercises  lumbar rotation at multiuse machine x10 each side 5#      Lumbar Exercises: Prone   Other Prone Lumbar Exercises  open book sidelying thoracic rotation x5 with 10 sec hold B    Other Prone Lumbar Exercises  prone press up on elbows x10 sec hold 10 reps      Manual Therapy   Manual Therapy  Soft tissue mobilization;Passive ROM;Joint mobilization    Joint Mobilization  Grade III to lumbar spine     Soft tissue mobilization  STM to R piriformis     Passive ROM  PROM to R piriformis             PT Education - 11/01/19 0846    Education Details  Pt educated on updated HEP    Person(s) Educated  Patient    Methods  Explanation;Demonstration;Handout    Comprehension  Verbalized understanding;Returned demonstration       PT Short Term Goals - 10/19/19 1032      PT SHORT TERM GOAL #1   Title  Pt will be independent with HEP    Time  2    Period  Weeks    Status  New    Target Date  11/02/19        PT Long Term Goals - 10/19/19 1032      PT LONG TERM GOAL #1   Title  Pt will demonstrate increase in bilat hamstring length to 90 deg or greater in order to decrease pain and increase functional ROM    Time  8    Period  Weeks    Status  New    Target Date  12/14/19      PT LONG TERM GOAL #2   Title  Pt will increase lumbar extension ROM to <25% limited in order to decrease pain and improve functional abilities    Time  8    Period  Weeks    Status  New    Target Date  12/14/19      PT LONG TERM GOAL #3   Title  Pt will increase hip abduction and hip extension strength to 4/5 in order to facilitate return to pain free ADLs    Time  8    Period  Weeks    Status  New    Target Date  12/14/19      PT LONG TERM GOAL #4  Title  Pt will report ability to walk >30 min with no increase in LBP    Time  8    Period  Weeks    Status  New    Target Date  12/14/19      PT LONG  TERM GOAL #5   Title  Pt will report resolution of radiating pain down RLE    Time  8    Period  Weeks    Status  New    Target Date  12/14/19            Plan - 11/01/19 [id number]    Clinical Impression Statement  Pt returns to clinic with reports of mildly increased R radiating pain mostly in the mornings. Pt demonstrates very tight piriformis on R and mod hypomobility of lumbar spine. Added lumbar flexibility ex's and piriformis stretching to HEP to address. Continue to progress next rx.    Comorbidities  COPD    PT Treatment/Interventions  ADLs/Self Care Home Management;Electrical Stimulation;Iontophoresis 4mg /ml Dexamethasone;Moist Heat;Gait training;Stair training;Functional mobility training;Therapeutic activities;Therapeutic exercise;Balance training;Neuromuscular re-education;Manual techniques;Patient/family education;Passive range of motion    PT Next Visit Plan  Progress LE strengthening and flexibility ex's, review/progress HEP, manual as indicated    PT Home Exercise Plan  seated hamstring stretch, standing quad stretch, supine bridges, supine piriformis stretch, prone press ups, standing extension, thoracic open book rotations    Consulted and Agree with Plan of Care  Patient       Patient will benefit from skilled therapeutic intervention in order to improve the following deficits and impairments:  Abnormal gait, Decreased range of motion, Difficulty walking, Decreased endurance, Decreased activity tolerance, Pain, Decreased balance, Hypomobility, Impaired flexibility, Decreased mobility  Visit Diagnosis: Muscle weakness (generalized)  Acute right-sided low back pain with right-sided sciatica     Problem List Patient Active Problem List   Diagnosis Date Noted  . Stiffness of multiple joints 10/10/2019  . Carotid stenosis, asymptomatic, bilateral 02/06/2019  . Vertigo 01/28/2019  . Abrasion of nose 04/23/2018  . Thoracic ascending aortic aneurysm ([place]) 10/26/2017  .  S/P total knee arthroplasty 05/20/2017  . Osteoarthritis 09/22/2016  . Pulmonary nodules 09/25/2015  . CAD (coronary artery disease) 09/25/2015  . Atherosclerosis of aortic arch ([place]) 09/25/2015  . Verruca 08/17/2015  . Advanced care planning/counseling discussion 09/08/2014  . Health maintenance examination 09/08/2014  . 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 Spinal stenosis in cervical region 11/22/2013  . Medicare annual wellness visit, subsequent 06/22/2012  . Cervical stenosis of spinal canal 07/29/2011    Class: Chronic  . SHOULDER PAIN, RIGHT 04/18/2010  . History of tobacco abuse 12/02/2006  . COPD (chronic obstructive pulmonary disease) ([place]) 12/01/2006  . DIVERTICULOSIS, COLON 12/01/2006  . Chronic back pain 12/01/2006  . PROSTATE CANCER, HX OF 12/01/2006   Amador Cunas, PT, DPT Donald Prose Takeia Ciaravino 11/01/2019, 9:29 AM  [facility] [address] [place], [place], [address] Phone: [phone]   Fax:  [phone]  Name: Carlos Olson MRN: [id number] Date of Birth: 1941-03-05

## 2019-11-01 NOTE — Telephone Encounter (Signed)
LMTCB to verify pharm

## 2019-11-01 NOTE — Patient Instructions (Signed)
Access Code: [email or web address] URL: [email or web address] Date: 11/01/2019 Prepared by: Amador Cunas  Exercises Prone on Elbows Stretch - 1 x daily - 5 x weekly - 3 sets - 5 reps - 10 sec hold Sidelying Thoracic Rotation with Open Book - 1 x daily - 5 x weekly - 3 sets - 5 reps - 10 sec hold Standing Lumbar Extension with Counter - 1 x daily - 5 x weekly - 3 sets - 10 reps - 3 sec hold Supine Piriformis Stretch with Foot on Ground - 1 x daily - 5 x weekly - 3 sets - 3 reps - 30 sec hold

## 2019-11-02 MED ORDER — BREO ELLIPTA 100-25 MCG/INH IN AEPB: 1.0000 | INHALATION_SPRAY | Freq: Every day | RESPIRATORY_TRACT | 5 refills | Status: DC

## 2019-11-02 NOTE — Telephone Encounter (Signed)
Spoke with patient's wife. She is aware that the [facility] refill will be sent to [facility]. Advised her to call us back if he had any issues with getting his inhaler, she verbalized understanding.   Nothing further needed at time of call.

## 2019-11-02 NOTE — Telephone Encounter (Signed)
Pt would like RX sent to [facility] -  Phone number : [phone]

## 2019-11-03 ENCOUNTER — Other Ambulatory Visit: Payer: Self-pay

## 2019-11-03 ENCOUNTER — Encounter: Payer: Self-pay | Admitting: Physical Therapy

## 2019-11-03 ENCOUNTER — Ambulatory Visit: Payer: Medicare HMO | Admitting: Physical Therapy

## 2019-11-03 DIAGNOSIS — M5441 Lumbago with sciatica, right side: Secondary | ICD-10-CM

## 2019-11-03 DIAGNOSIS — M6281 Muscle weakness (generalized): Secondary | ICD-10-CM | POA: Diagnosis not present

## 2019-11-03 NOTE — Therapy (Signed)
[facility] [address] [place], [place], [address] Phone: [phone]   Fax:  [phone]  Physical Therapy Treatment  Patient Details  Name: Carlos Olson MRN: [id number] Date of Birth: 08/23/1940 Referring Provider (PT): Ria Bush   Encounter Date: 11/03/2019  PT End of Session - 11/03/19 0844    Visit Number  5    Date for PT Re-Evaluation  12/19/19    PT Start Time  0800    PT Stop Time  0845    PT Time Calculation (min)  45 min    Activity Tolerance  Patient tolerated treatment well    Behavior During Therapy  [facility] for tasks assessed/performed       Past Medical History:  Diagnosis Date  . Atherosclerosis of aortic arch ([place]) 09/25/2015   By CT scan   . CAD (coronary artery disease) 09/25/2015   Mild 3v by CT scan   . Cancer [facility])    prostate  . Colon polyps   . COPD (chronic obstructive pulmonary disease) ([place])   . Diverticulosis of colon   . Femoral bruit    bilateral, found by Dr. Jefm Bryant  . Heart murmur    child  . History of prostate cancer   . IBS (irritable bowel syndrome)    improved after retirement  . 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 Pelvic fracture ([place])   . Pneumonia    child  . Pulmonary nodules 09/25/2015   On screening CT scan, rpt 1 yr   . Shortness of breath    doe    Past Surgical History:  Procedure Laterality Date  . ANTERIOR CERVICAL DECOMP/DISCECTOMY FUSION  07/29/2011   Procedure: ANTERIOR CERVICAL DECOMPRESSION/DISCECTOMY FUSION 1 LEVEL;  Surgeon: Jessy Oto, MD;  Location: [place];  Service: Orthopedics;  Laterality: N/A;  Anterior Cervical Discectomy and Fusion C3-4  . CATARACT EXTRACTION W/PHACO Right 07/10/2015   Procedure: CATARACT EXTRACTION PHACO AND INTRAOCULAR LENS PLACEMENT (IOC);  Surgeon: Birder Robson, MD;  Location: [facility];  Service: Ophthalmology;  Laterality: Right;  [place] 00:58   . CATARACT  EXTRACTION W/PHACO Left 07/24/2015   Procedure: CATARACT EXTRACTION PHACO AND INTRAOCULAR LENS PLACEMENT (IOC);  Surgeon: Birder Robson, MD;  Location: [facility];  Service: Ophthalmology;  Laterality: Left;  [place] 00:38   . COLONOSCOPY  12/2007   Medhoff, diverticulosis and polyp, rpt 5 yrs  . dental implants    . FORAMINOTOMY 2 LEVEL Left 10/2013   C4/5, C5/6 ([place])  . HERNIA REPAIR     right 05/1999, left 09/04/05  . JOINT REPLACEMENT Right    TKR  . KNEE ARTHROPLASTY Left 05/20/2017   Procedure: COMPUTER ASSISTED TOTAL KNEE ARTHROPLASTY;  Surgeon: Dereck Leep, MD;  Location: [facility];  Service: Orthopedics;  Laterality: Left;  . KNEE ARTHROSCOPY  09/1997   left  . POSTERIOR CERVICAL FUSION/FORAMINOTOMY N/A 11/22/2013   Procedure: LEFT C4-5 AND C5-6 FORAMINOTOMY;  Surgeon: Jessy Oto, MD;  Location: [place];  Service: Orthopedics;  Laterality: N/A;  . PROSTATECTOMY  08/1990  . 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 TONSILLECTOMY      There were no vitals filed for this visit.  Subjective Assessment - 11/03/19 0801    Subjective  Pt reports radiating pain this morning which resolved by the time he had finished his morning coffee; "normal" level of soreness in the back.    Currently in Pain?  Yes    Pain Score  2     Pain Location  Back    Pain Orientation  Right;Lower                       [facility] Adult PT Treatment/Exercise - 11/03/19 0001      Lumbar Exercises: Stretches   Active Hamstring Stretch  Right;Left;30 seconds    Passive Hamstring Stretch  Right;Left;1 rep;60 seconds    Passive Hamstring Stretch Limitations  very limited    Single Knee to Chest Stretch  Right;Left;2 reps;60 seconds    Single Knee to Chest Stretch Limitations  with rotation B    Piriformis Stretch  Right;Left;1 rep;60 seconds    Piriformis Stretch Limitations  seated very limited      Lumbar Exercises: Aerobic   Nustep  L5 x 10 min      Lumbar Exercises: Machines for Strengthening    Cybex Lumbar Extension  2x10 black TB    Cybex Knee Extension  10# 2x10    Cybex Knee Flexion  35# 2x10    Leg Press  40# 2x10 BLE               PT Short Term Goals - 10/19/19 1032      PT SHORT TERM GOAL #1   Title  Pt will be independent with HEP    Time  2    Period  Weeks    Status  New    Target Date  11/02/19        PT Long Term Goals - 10/19/19 1032      PT LONG TERM GOAL #1   Title  Pt will demonstrate increase in bilat hamstring length to 90 deg or greater in order to decrease pain and increase functional ROM    Time  8    Period  Weeks    Status  New    Target Date  12/14/19      PT LONG TERM GOAL #2   Title  Pt will increase lumbar extension ROM to <25% limited in order to decrease pain and improve functional abilities    Time  8    Period  Weeks    Status  New    Target Date  12/14/19      PT LONG TERM GOAL #3   Title  Pt will increase hip abduction and hip extension strength to 4/5 in order to facilitate return to pain free ADLs    Time  8    Period  Weeks    Status  New    Target Date  12/14/19      PT LONG TERM GOAL #4   Title  Pt will report ability to walk >30 min with no increase in LBP    Time  8    Period  Weeks    Status  New    Target Date  12/14/19      PT LONG TERM GOAL #5   Title  Pt will report resolution of radiating pain down RLE    Time  8    Period  Weeks    Status  New    Target Date  12/14/19            Plan - 11/03/19 0844    Clinical Impression Statement  Pt tolerated progression of TE well. Pt continues to demonstrate bilateral tightness in piriformis/hamstring R>L which may be contributing to radiating pain in RLE. Continue to  progress next rx.    PT Treatment/Interventions  ADLs/Self Care Home Management;Electrical Stimulation;Iontophoresis 4mg /ml Dexamethasone;Moist Heat;Gait training;Stair training;Functional mobility training;Therapeutic activities;Therapeutic exercise;Balance training;Neuromuscular  re-education;Manual techniques;Patient/family education;Passive range of motion    PT Next Visit Plan  Progress LE strengthening and flexibility ex's, review/progress HEP, manual as indicated    Consulted and Agree with Plan of Care  Patient       Patient will benefit from skilled therapeutic intervention in order to improve the following deficits and impairments:     Visit Diagnosis: Muscle weakness (generalized)  Acute right-sided low back pain with right-sided sciatica     Problem List Patient Active Problem List   Diagnosis Date Noted  . Stiffness of multiple joints 10/10/2019  . Carotid stenosis, asymptomatic, bilateral 02/06/2019  . Vertigo 01/28/2019  . Abrasion of nose 04/23/2018  . Thoracic ascending aortic aneurysm ([place]) 10/26/2017  . S/P total knee arthroplasty 05/20/2017  . Osteoarthritis 09/22/2016  . Pulmonary nodules 09/25/2015  . CAD (coronary artery disease) 09/25/2015  . Atherosclerosis of aortic arch ([place]) 09/25/2015  . Verruca 08/17/2015  . Advanced care planning/counseling discussion 09/08/2014  . Health maintenance examination 09/08/2014  . 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 Spinal stenosis in cervical region 11/22/2013  . Medicare annual wellness visit, subsequent 06/22/2012  . Cervical stenosis of spinal canal 07/29/2011    Class: Chronic  . SHOULDER PAIN, RIGHT 04/18/2010  . History of tobacco abuse 12/02/2006  . COPD (chronic obstructive pulmonary disease) ([place]) 12/01/2006  . DIVERTICULOSIS, COLON 12/01/2006  . Chronic back pain 12/01/2006  . PROSTATE CANCER, HX OF 12/01/2006   Amador Cunas, PT, DPT Donald Prose Shellie Goettl 11/03/2019, 8:46 AM  [place] [address] [place], [place], [address] Phone: [phone]   Fax:  [phone]  Name: HYKEEM FANE MRN: [id number] Date of Birth: Dec 04, 1940

## 2019-11-08 ENCOUNTER — Other Ambulatory Visit: Payer: Self-pay

## 2019-11-08 ENCOUNTER — Encounter: Payer: Self-pay | Admitting: Physical Therapy

## 2019-11-08 ENCOUNTER — Ambulatory Visit: Payer: Medicare HMO | Admitting: Physical Therapy

## 2019-11-08 DIAGNOSIS — M5441 Lumbago with sciatica, right side: Secondary | ICD-10-CM

## 2019-11-08 DIAGNOSIS — M6281 Muscle weakness (generalized): Secondary | ICD-10-CM | POA: Diagnosis not present

## 2019-11-08 NOTE — Therapy (Signed)
[place] [address] [place], [place], [address] Phone: [phone]   Fax:  [phone]  Physical Therapy Treatment  Patient Details  Name: Carlos Olson MRN: [id number] Date of Birth: 01/27/1941 Referring Provider (PT): Ria Bush   Encounter Date: 11/08/2019  PT End of Session - 11/08/19 0848    Visit Number  6    Date for PT Re-Evaluation  12/19/19    PT Start Time  0800    PT Stop Time  0845    PT Time Calculation (min)  45 min    Activity Tolerance  Patient tolerated treatment well    Behavior During Therapy  [facility] for tasks assessed/performed       Past Medical History:  Diagnosis Date  . Atherosclerosis of aortic arch ([place]) 09/25/2015   By CT scan   . CAD (coronary artery disease) 09/25/2015   Mild 3v by CT scan   . Cancer [facility])    prostate  . Colon polyps   . COPD (chronic obstructive pulmonary disease) ([place])   . Diverticulosis of colon   . Femoral bruit    bilateral, found by Dr. Jefm Bryant  . Heart murmur    child  . History of prostate cancer   . IBS (irritable bowel syndrome)    improved after retirement  . 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 Pelvic fracture ([place])   . Pneumonia    child  . Pulmonary nodules 09/25/2015   On screening CT scan, rpt 1 yr   . Shortness of breath    doe    Past Surgical History:  Procedure Laterality Date  . ANTERIOR CERVICAL DECOMP/DISCECTOMY FUSION  07/29/2011   Procedure: ANTERIOR CERVICAL DECOMPRESSION/DISCECTOMY FUSION 1 LEVEL;  Surgeon: Jessy Oto, MD;  Location: [place];  Service: Orthopedics;  Laterality: N/A;  Anterior Cervical Discectomy and Fusion C3-4  . CATARACT EXTRACTION W/PHACO Right 07/10/2015   Procedure: CATARACT EXTRACTION PHACO AND INTRAOCULAR LENS PLACEMENT (IOC);  Surgeon: Birder Robson, MD;  Location: [facility];  Service: Ophthalmology;  Laterality: Right;  [place] 00:58   . CATARACT  EXTRACTION W/PHACO Left 07/24/2015   Procedure: CATARACT EXTRACTION PHACO AND INTRAOCULAR LENS PLACEMENT (IOC);  Surgeon: Birder Robson, MD;  Location: [facility];  Service: Ophthalmology;  Laterality: Left;  [place] 00:38   . COLONOSCOPY  12/2007   Medhoff, diverticulosis and polyp, rpt 5 yrs  . dental implants    . FORAMINOTOMY 2 LEVEL Left 10/2013   C4/5, C5/6 (Nitka)  . HERNIA REPAIR     right 05/1999, left 09/04/05  . JOINT REPLACEMENT Right    TKR  . KNEE ARTHROPLASTY Left 05/20/2017   Procedure: COMPUTER ASSISTED TOTAL KNEE ARTHROPLASTY;  Surgeon: Dereck Leep, MD;  Location: [facility];  Service: Orthopedics;  Laterality: Left;  . KNEE ARTHROSCOPY  09/1997   left  . POSTERIOR CERVICAL FUSION/FORAMINOTOMY N/A 11/22/2013   Procedure: LEFT C4-5 AND C5-6 FORAMINOTOMY;  Surgeon: Jessy Oto, MD;  Location: [place];  Service: Orthopedics;  Laterality: N/A;  . PROSTATECTOMY  08/1990  . 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 TONSILLECTOMY      There were no vitals filed for this visit.  Subjective Assessment - 11/08/19 0810    Subjective  Pt reports radiating pain was worse this weekend to the point that he did not complete his exercises; pt reports it is getting better today. Pt expressed interest in dry needling.  Currently in Pain?  Yes    Pain Score  2     Pain Location  Back    Pain Orientation  Right;Lower                       [facility] Adult PT Treatment/Exercise - 11/08/19 0001      Lumbar Exercises: Stretches   Active Hamstring Stretch  Right;Left;30 seconds      Lumbar Exercises: Aerobic   Nustep  L5 x 10 min      Lumbar Exercises: Machines for Strengthening   Cybex Lumbar Extension  2x10 black TB    Cybex Knee Extension  10# 2x10    Cybex Knee Flexion  35# 2x10    Leg Press  40# 2x10 BLE    Other Lumbar Machine Exercise  heel raises 40# 1x15      Lumbar Exercises: Seated   Other Seated Lumbar Exercises  seated forward/side lumbar flexion with exercise  ball; hold 5 seconds x4 each direction      Manual Therapy   Manual Therapy  Soft tissue mobilization;Passive ROM;Joint mobilization    Joint Mobilization  Grade III to lumbar spine     Soft tissue mobilization  STM to R piriformis     Passive ROM  PROM to R piriformis               PT Short Term Goals - 10/19/19 1032      PT SHORT TERM GOAL #1   Title  Pt will be independent with HEP    Time  2    Period  Weeks    Status  New    Target Date  11/02/19        PT Long Term Goals - 10/19/19 1032      PT LONG TERM GOAL #1   Title  Pt will demonstrate increase in bilat hamstring length to 90 deg or greater in order to decrease pain and increase functional ROM    Time  8    Period  Weeks    Status  New    Target Date  12/14/19      PT LONG TERM GOAL #2   Title  Pt will increase lumbar extension ROM to <25% limited in order to decrease pain and improve functional abilities    Time  8    Period  Weeks    Status  New    Target Date  12/14/19      PT LONG TERM GOAL #3   Title  Pt will increase hip abduction and hip extension strength to 4/5 in order to facilitate return to pain free ADLs    Time  8    Period  Weeks    Status  New    Target Date  12/14/19      PT LONG TERM GOAL #4   Title  Pt will report ability to walk >30 min with no increase in LBP    Time  8    Period  Weeks    Status  New    Target Date  12/14/19      PT LONG TERM GOAL #5   Title  Pt will report resolution of radiating pain down RLE    Time  8    Period  Weeks    Status  New    Target Date  12/14/19            Plan - 11/08/19 [id number]  Clinical Impression Statement  Pt continues to do well with progression of TE in clinic but reports that morning radiating pain in RLE is worsening. Try DN at next session if possible; pt demonstrates significant flexibility deficits and tightness in hip musculature, glute/piriformis, and lower lumbar.    PT Treatment/Interventions  ADLs/Self Care  Home Management;Electrical Stimulation;Iontophoresis 4mg /ml Dexamethasone;Moist Heat;Gait training;Stair training;Functional mobility training;Therapeutic activities;Therapeutic exercise;Balance training;Neuromuscular re-education;Manual techniques;Patient/family education;Passive range of motion    Consulted and Agree with Plan of Care  Patient       Patient will benefit from skilled therapeutic intervention in order to improve the following deficits and impairments:  Abnormal gait, Decreased range of motion, Difficulty walking, Decreased endurance, Decreased activity tolerance, Pain, Decreased balance, Hypomobility, Impaired flexibility, Decreased mobility  Visit Diagnosis: Muscle weakness (generalized)  Acute right-sided low back pain with right-sided sciatica     Problem List Patient Active Problem List   Diagnosis Date Noted  . Stiffness of multiple joints 10/10/2019  . Carotid stenosis, asymptomatic, bilateral 02/06/2019  . Vertigo 01/28/2019  . Abrasion of nose 04/23/2018  . Thoracic ascending aortic aneurysm ([place]) 10/26/2017  . S/P total knee arthroplasty 05/20/2017  . Osteoarthritis 09/22/2016  . Pulmonary nodules 09/25/2015  . CAD (coronary artery disease) 09/25/2015  . Atherosclerosis of aortic arch ([place]) 09/25/2015  . Verruca 08/17/2015  . Advanced care planning/counseling discussion 09/08/2014  . Health maintenance examination 09/08/2014  . 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 Spinal stenosis in cervical region 11/22/2013  . Medicare annual wellness visit, subsequent 06/22/2012  . Cervical stenosis of spinal canal 07/29/2011    Class: Chronic  . SHOULDER PAIN, RIGHT 04/18/2010  . History of tobacco abuse 12/02/2006  . COPD (chronic obstructive pulmonary disease) ([place]) 12/01/2006  . DIVERTICULOSIS, COLON 12/01/2006  . Chronic back pain 12/01/2006  . PROSTATE CANCER, HX OF 12/01/2006   Amador Cunas, PT, DPT Donald Prose  Viktorya Arguijo 11/08/2019, 8:51 AM  [address] [place], [place], [address] Phone: [phone]   Fax:  [phone]  Name: Carlos Olson MRN: [id number] Date of Birth: Jan 09, 1941

## 2019-11-10 ENCOUNTER — Other Ambulatory Visit: Payer: Self-pay

## 2019-11-10 ENCOUNTER — Ambulatory Visit: Payer: Medicare HMO | Admitting: Physical Therapy

## 2019-11-10 ENCOUNTER — Encounter: Payer: Self-pay | Admitting: Physical Therapy

## 2019-11-10 DIAGNOSIS — M5441 Lumbago with sciatica, right side: Secondary | ICD-10-CM

## 2019-11-10 DIAGNOSIS — M6281 Muscle weakness (generalized): Secondary | ICD-10-CM | POA: Diagnosis not present

## 2019-11-10 NOTE — Therapy (Signed)
[place] [address] [place], [place], [address] Phone: [phone]   Fax:  [phone]  Physical Therapy Treatment  Patient Details  Name: Carlos Olson MRN: [id number] Date of Birth: 17-Apr-1941 Referring Provider (PT): Ria Bush   Encounter Date: 11/10/2019  PT End of Session - 11/10/19 0839    Visit Number  7    Date for PT Re-Evaluation  12/19/19    PT Start Time  0800    PT Stop Time  0850    PT Time Calculation (min)  50 min    Activity Tolerance  Patient tolerated treatment well    Behavior During Therapy  [facility] for tasks assessed/performed       Past Medical History:  Diagnosis Date  . Atherosclerosis of aortic arch ([place]) 09/25/2015   By CT scan   . CAD (coronary artery disease) 09/25/2015   Mild 3v by CT scan   . Cancer [facility])    prostate  . Colon polyps   . COPD (chronic obstructive pulmonary disease) ([place])   . Diverticulosis of colon   . Femoral bruit    bilateral, found by Dr. Jefm Bryant  . Heart murmur    child  . History of prostate cancer   . IBS (irritable bowel syndrome)    improved after retirement  . 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 Pelvic fracture ([place])   . Pneumonia    child  . Pulmonary nodules 09/25/2015   On screening CT scan, rpt 1 yr   . Shortness of breath    doe    Past Surgical History:  Procedure Laterality Date  . ANTERIOR CERVICAL DECOMP/DISCECTOMY FUSION  07/29/2011   Procedure: ANTERIOR CERVICAL DECOMPRESSION/DISCECTOMY FUSION 1 LEVEL;  Surgeon: Jessy Oto, MD;  Location: [place];  Service: Orthopedics;  Laterality: N/A;  Anterior Cervical Discectomy and Fusion C3-4  . CATARACT EXTRACTION W/PHACO Right 07/10/2015   Procedure: CATARACT EXTRACTION PHACO AND INTRAOCULAR LENS PLACEMENT (IOC);  Surgeon: Birder Robson, MD;  Location: [facility];  Service: Ophthalmology;  Laterality: Right;  [place] 00:58   . CATARACT  EXTRACTION W/PHACO Left 07/24/2015   Procedure: CATARACT EXTRACTION PHACO AND INTRAOCULAR LENS PLACEMENT (IOC);  Surgeon: Birder Robson, MD;  Location: [facility];  Service: Ophthalmology;  Laterality: Left;  [place] 00:38   . COLONOSCOPY  12/2007   Medhoff, diverticulosis and polyp, rpt 5 yrs  . dental implants    . FORAMINOTOMY 2 LEVEL Left 10/2013   C4/5, C5/6 ([place])  . HERNIA REPAIR     right 05/1999, left 09/04/05  . JOINT REPLACEMENT Right    TKR  . KNEE ARTHROPLASTY Left 05/20/2017   Procedure: COMPUTER ASSISTED TOTAL KNEE ARTHROPLASTY;  Surgeon: Dereck Leep, MD;  Location: [facility];  Service: Orthopedics;  Laterality: Left;  . KNEE ARTHROSCOPY  09/1997   left  . POSTERIOR CERVICAL FUSION/FORAMINOTOMY N/A 11/22/2013   Procedure: LEFT C4-5 AND C5-6 FORAMINOTOMY;  Surgeon: Jessy Oto, MD;  Location: [facility];  Service: Orthopedics;  Laterality: N/A;  . PROSTATECTOMY  08/1990  . 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 TONSILLECTOMY      There were no vitals filed for this visit.  Subjective Assessment - 11/10/19 0758    Subjective  Pt reports that radiating pain is getting better over the past few days.    Currently in Pain?  Yes    Pain Score  3  Pain Location  Back    Pain Orientation  Right;Lower                        [facility] Adult PT Treatment/Exercise - 11/10/19 0001      Lumbar Exercises: Stretches   Active Hamstring Stretch  Right;Left;30 seconds    Lower Trunk Rotation  5 reps;10 seconds    Piriformis Stretch  Right;Left;1 rep;60 seconds    Piriformis Stretch Limitations  seated very limited      Lumbar Exercises: Aerobic   UBE (Upper Arm Bike)  2 min fwd/81min bkwd    Nustep  L5 x 5 min      Lumbar Exercises: Machines for Strengthening   Leg Press  40# 2x10 BLE    Other Lumbar Machine Exercise  heel raises 40# 1x15      Modalities   Modalities  Traction      Traction   Type of Traction  Lumbar    Min (lbs)  40    Max (lbs)  50    Hold  Time  60    Rest Time  20    Time  15             PT Education - 11/10/19 0839    Education Details  Pt educated on traction use, benefits, risks, and procedure    Person(s) Educated  Patient    Methods  Explanation    Comprehension  Verbalized understanding       PT Short Term Goals - 10/19/19 1032      PT SHORT TERM GOAL #1   Title  Pt will be independent with HEP    Time  2    Period  Weeks    Status  New    Target Date  11/02/19        PT Long Term Goals - 10/19/19 1032      PT LONG TERM GOAL #1   Title  Pt will demonstrate increase in bilat hamstring length to 90 deg or greater in order to decrease pain and increase functional ROM    Time  8    Period  Weeks    Status  New    Target Date  12/14/19      PT LONG TERM GOAL #2   Title  Pt will increase lumbar extension ROM to <25% limited in order to decrease pain and improve functional abilities    Time  8    Period  Weeks    Status  New    Target Date  12/14/19      PT LONG TERM GOAL #3   Title  Pt will increase hip abduction and hip extension strength to 4/5 in order to facilitate return to pain free ADLs    Time  8    Period  Weeks    Status  New    Target Date  12/14/19      PT LONG TERM GOAL #4   Title  Pt will report ability to walk >30 min with no increase in LBP    Time  8    Period  Weeks    Status  New    Target Date  12/14/19      PT LONG TERM GOAL #5   Title  Pt will report resolution of radiating pain down RLE    Time  8    Period  Weeks    Status  New  Target Date  12/14/19            Plan - 11/10/19 0840    Clinical Impression Statement  Pt reports that lumbar stretching and flexibility has somewhat relieved radiating pain in RLE. Progressing flexibility/strengthening ex's; pt tolerated well. Lumbar traction today; assess pt response next rx and repeat if indicated.    PT Treatment/Interventions  ADLs/Self Care Home Management;Electrical Stimulation;Iontophoresis 4mg /ml  Dexamethasone;Moist Heat;Gait training;Stair training;Functional mobility training;Therapeutic activities;Therapeutic exercise;Balance training;Neuromuscular re-education;Manual techniques;Patient/family education;Passive range of motion    PT Next Visit Plan  Progress LE strengthening and flexibility ex's, review/progress HEP, manual as indicated    Consulted and Agree with Plan of Care  Patient       Patient will benefit from skilled therapeutic intervention in order to improve the following deficits and impairments:  Abnormal gait, Decreased range of motion, Difficulty walking, Decreased endurance, Decreased activity tolerance, Pain, Decreased balance, Hypomobility, Impaired flexibility, Decreased mobility  Visit Diagnosis: Muscle weakness (generalized)  Acute right-sided low back pain with right-sided sciatica     Problem List Patient Active Problem List   Diagnosis Date Noted  . Stiffness of multiple joints 10/10/2019  . Carotid stenosis, asymptomatic, bilateral 02/06/2019  . Vertigo 01/28/2019  . Abrasion of nose 04/23/2018  . Thoracic ascending aortic aneurysm ([place]) 10/26/2017  . S/P total knee arthroplasty 05/20/2017  . Osteoarthritis 09/22/2016  . Pulmonary nodules 09/25/2015  . CAD (coronary artery disease) 09/25/2015  . Atherosclerosis of aortic arch ([place]) 09/25/2015  . Verruca 08/17/2015  . Advanced care planning/counseling discussion 09/08/2014  . Health maintenance examination 09/08/2014  . 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 Spinal stenosis in cervical region 11/22/2013  . Medicare annual wellness visit, subsequent 06/22/2012  . Cervical stenosis of spinal canal 07/29/2011    Class: Chronic  . SHOULDER PAIN, RIGHT 04/18/2010  . History of tobacco abuse 12/02/2006  . COPD (chronic obstructive pulmonary disease) ([place]) 12/01/2006  . DIVERTICULOSIS, COLON 12/01/2006  . Chronic back pain 12/01/2006  . PROSTATE CANCER, HX OF  12/01/2006   Amador Cunas, PT, DPT Donald Prose Raphael Espe 11/10/2019, 8:42 AM  [place] [address] [place], [place], [address] Phone: [phone]   Fax:  [phone]  Name: Carlos Olson MRN: [id number] Date of Birth: 12/22/1940

## 2019-11-14 ENCOUNTER — Encounter: Payer: Self-pay | Admitting: Physical Therapy

## 2019-11-14 ENCOUNTER — Other Ambulatory Visit: Payer: Self-pay

## 2019-11-14 ENCOUNTER — Ambulatory Visit: Payer: Medicare HMO | Admitting: Physical Therapy

## 2019-11-14 DIAGNOSIS — M5441 Lumbago with sciatica, right side: Secondary | ICD-10-CM

## 2019-11-14 DIAGNOSIS — M6281 Muscle weakness (generalized): Secondary | ICD-10-CM

## 2019-11-14 NOTE — Therapy (Signed)
Carlos Olson, [place], [address] Phone: [phone]   Fax:  [phone]  Physical Therapy Treatment  Patient Details  Name: Carlos Olson MRN: [id number] Date of Birth: 09-22-1940 Referring Provider (PT): Ria Bush   Encounter Date: 11/14/2019  PT End of Session - 11/14/19 1419    Visit Number  8    Date for PT Re-Evaluation  12/19/19    PT Start Time  1353    PT Stop Time  1450    PT Time Calculation (min)  57 min    Activity Tolerance  Patient tolerated treatment well    Behavior During Therapy  [facility] for tasks assessed/performed       Past Medical History:  Diagnosis Date  . Atherosclerosis of aortic arch ([place]) 09/25/2015   By CT scan   . CAD (coronary artery disease) 09/25/2015   Mild 3v by CT scan   . Cancer [facility])    prostate  . Colon polyps   . COPD (chronic obstructive pulmonary disease) ([place])   . Diverticulosis of colon   . Femoral bruit    bilateral, found by Dr. Jefm Bryant  . Heart murmur    child  . History of prostate cancer   . IBS (irritable bowel syndrome)    improved after retirement  . 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 Pelvic fracture ([place])   . Pneumonia    child  . Pulmonary nodules 09/25/2015   On screening CT scan, rpt 1 yr   . Shortness of breath    doe    Past Surgical History:  Procedure Laterality Date  . ANTERIOR CERVICAL DECOMP/DISCECTOMY FUSION  07/29/2011   Procedure: ANTERIOR CERVICAL DECOMPRESSION/DISCECTOMY FUSION 1 LEVEL;  Surgeon: Jessy Oto, MD;  Location: [place];  Service: Orthopedics;  Laterality: N/A;  Anterior Cervical Discectomy and Fusion C3-4  . CATARACT EXTRACTION W/PHACO Right 07/10/2015   Procedure: CATARACT EXTRACTION PHACO AND INTRAOCULAR LENS PLACEMENT (IOC);  Surgeon: Birder Robson, MD;  Location: [facility];  Service: Ophthalmology;  Laterality: Right;  [place] 00:58   . CATARACT  EXTRACTION W/PHACO Left 07/24/2015   Procedure: CATARACT EXTRACTION PHACO AND INTRAOCULAR LENS PLACEMENT (IOC);  Surgeon: Birder Robson, MD;  Location: [facility];  Service: Ophthalmology;  Laterality: Left;  [place] 00:38   . COLONOSCOPY  12/2007   Medhoff, diverticulosis and polyp, rpt 5 yrs  . dental implants    . FORAMINOTOMY 2 LEVEL Left 10/2013   C4/5, C5/6 ([place])  . HERNIA REPAIR     right 05/1999, left 09/04/05  . JOINT REPLACEMENT Right    TKR  . KNEE ARTHROPLASTY Left 05/20/2017   Procedure: COMPUTER ASSISTED TOTAL KNEE ARTHROPLASTY;  Surgeon: Dereck Leep, MD;  Location: [facility];  Service: Orthopedics;  Laterality: Left;  . KNEE ARTHROSCOPY  09/1997   left  . POSTERIOR CERVICAL FUSION/FORAMINOTOMY N/A 11/22/2013   Procedure: LEFT C4-5 AND C5-6 FORAMINOTOMY;  Surgeon: Jessy Oto, MD;  Location: [place];  Service: Orthopedics;  Laterality: N/A;  . PROSTATECTOMY  08/1990  . 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 TONSILLECTOMY      There were no vitals filed for this visit.  Subjective Assessment - 11/14/19 1417    Subjective  Patient reports that after the last treatment he felt the same but as the day went on he felt the best he had felt, so he thinks the traction really worked.  Currently in Pain?  Yes    Pain Location  Back    Pain Orientation  Lower    Aggravating Factors   walking, bending                        [facility] Adult PT Treatment/Exercise - 11/14/19 0001      Lumbar Exercises: Stretches   Piriformis Stretch  Right;Left;20 seconds;4 reps    Gastroc Stretch  Right;Left;4 reps;20 seconds      Lumbar Exercises: Aerobic   Nustep  L5 x 7 min      Lumbar Exercises: Machines for Strengthening   Cybex Lumbar Extension  2x10 black TB    Leg Press  40# 2x10 BLE    Other Lumbar Machine Exercise  heel raises 40# 1x15      Lumbar Exercises: Standing   Other Standing Lumbar Exercises  10# straight arm pulls with cues for posture and core activation       Lumbar Exercises: Supine   Other Supine Lumbar Exercises  feet on ball K2C, trunk rotation,       Modalities   Modalities  Traction      Traction   Type of Traction  Lumbar    Min (lbs)  40    Max (lbs)  50    Hold Time  60    Rest Time  20    Time  15               PT Short Term Goals - 10/19/19 1032      PT SHORT TERM GOAL #1   Title  Pt will be independent with HEP    Time  2    Period  Weeks    Status  New    Target Date  11/02/19        PT Long Term Goals - 11/14/19 1441      PT LONG TERM GOAL #1   Title  Pt will demonstrate increase in bilat hamstring length to 90 deg or greater in order to decrease pain and increase functional ROM    Status  On-going      PT LONG TERM GOAL #2   Title  Pt will increase lumbar extension ROM to <25% limited in order to decrease pain and improve functional abilities    Status  On-going      PT LONG TERM GOAL #3   Title  Pt will increase hip abduction and hip extension strength to 4/5 in order to facilitate return to pain free ADLs    Status  On-going      PT LONG TERM GOAL #4   Title  Pt will report ability to walk >30 min with no increase in LBP    Status  On-going            Plan - 11/14/19 1437    Clinical Impression Statement  Patient reports that the traction seemed to do very well, he reported relief after he got home, He tolerated the feet on ball exercises well, he is very tight in the HS and the piriformis.  Cues to engage core and he did well with this    PT Next Visit Plan  continue to work with him on flexibbility and core strength, could try to slowly bump up the traction if tolerated    Consulted and Agree with Plan of Care  Patient       Patient will benefit from skilled therapeutic intervention  in order to improve the following deficits and impairments:  Abnormal gait, Decreased range of motion, Difficulty walking, Decreased endurance, Decreased activity tolerance, Pain, Decreased balance,  Hypomobility, Impaired flexibility, Decreased mobility  Visit Diagnosis: Muscle weakness (generalized)  Acute right-sided low back pain with right-sided sciatica     Problem List Patient Active Problem List   Diagnosis Date Noted  . Stiffness of multiple joints 10/10/2019  . Carotid stenosis, asymptomatic, bilateral 02/06/2019  . Vertigo 01/28/2019  . Abrasion of nose 04/23/2018  . Thoracic ascending aortic aneurysm ([place]) 10/26/2017  . S/P total knee arthroplasty 05/20/2017  . Osteoarthritis 09/22/2016  . Pulmonary nodules 09/25/2015  . CAD (coronary artery disease) 09/25/2015  . Atherosclerosis of aortic arch ([place]) 09/25/2015  . Verruca 08/17/2015  . Advanced care planning/counseling discussion 09/08/2014  . Health maintenance examination 09/08/2014  . 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 Spinal stenosis in cervical region 11/22/2013  . Medicare annual wellness visit, subsequent 06/22/2012  . Cervical stenosis of spinal canal 07/29/2011    Class: Chronic  . SHOULDER PAIN, RIGHT 04/18/2010  . History of tobacco abuse 12/02/2006  . COPD (chronic obstructive pulmonary disease) ([place]) 12/01/2006  . DIVERTICULOSIS, COLON 12/01/2006  . Chronic back pain 12/01/2006  . PROSTATE CANCER, HX OF 12/01/2006    Sumner Boast., PT 11/14/2019, 2:44 PM  [place] [address] [place], [place], [address] Phone: [phone]   Fax:  [phone]  Name: KERVIN SWINGLER MRN: [id number] Date of Birth: 09-28-1940

## 2019-11-16 ENCOUNTER — Encounter: Payer: Medicare HMO | Admitting: Physical Therapy

## 2019-11-17 ENCOUNTER — Other Ambulatory Visit: Payer: Self-pay

## 2019-11-17 ENCOUNTER — Encounter: Payer: Self-pay | Admitting: Physical Therapy

## 2019-11-17 ENCOUNTER — Ambulatory Visit: Payer: Medicare HMO | Admitting: Physical Therapy

## 2019-11-17 DIAGNOSIS — M6281 Muscle weakness (generalized): Secondary | ICD-10-CM

## 2019-11-17 DIAGNOSIS — M5441 Lumbago with sciatica, right side: Secondary | ICD-10-CM

## 2019-11-17 NOTE — Therapy (Signed)
[address] [place], [place], [address] Phone: [phone]   Fax:  [phone]  Physical Therapy Treatment  Patient Details  Name: Carlos Olson MRN: [id number] Date of Birth: 08/09/1940 Referring Provider (PT): Ria Bush   Encounter Date: 11/17/2019  PT End of Session - 11/17/19 1507    Visit Number  9    Date for PT Re-Evaluation  12/19/19    PT Start Time  1429    PT Stop Time  1520    PT Time Calculation (min)  51 min    Activity Tolerance  Patient tolerated treatment well    Behavior During Therapy  [facility] for tasks assessed/performed       Past Medical History:  Diagnosis Date  . Atherosclerosis of aortic arch ([place]) 09/25/2015   By CT scan   . CAD (coronary artery disease) 09/25/2015   Mild 3v by CT scan   . Cancer [facility])    prostate  . Colon polyps   . COPD (chronic obstructive pulmonary disease) ([place])   . Diverticulosis of colon   . Femoral bruit    bilateral, found by Dr. Jefm Bryant  . Heart murmur    child  . History of prostate cancer   . IBS (irritable bowel syndrome)    improved after retirement  . 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 Pelvic fracture ([place])   . Pneumonia    child  . Pulmonary nodules 09/25/2015   On screening CT scan, rpt 1 yr   . Shortness of breath    doe    Past Surgical History:  Procedure Laterality Date  . ANTERIOR CERVICAL DECOMP/DISCECTOMY FUSION  07/29/2011   Procedure: ANTERIOR CERVICAL DECOMPRESSION/DISCECTOMY FUSION 1 LEVEL;  Surgeon: Jessy Oto, MD;  Location: [place];  Service: Orthopedics;  Laterality: N/A;  Anterior Cervical Discectomy and Fusion C3-4  . CATARACT EXTRACTION W/PHACO Right 07/10/2015   Procedure: CATARACT EXTRACTION PHACO AND INTRAOCULAR LENS PLACEMENT (IOC);  Surgeon: Birder Robson, MD;  Location: [facility];  Service: Ophthalmology;  Laterality: Right;  [place] 00:58   . CATARACT  EXTRACTION W/PHACO Left 07/24/2015   Procedure: CATARACT EXTRACTION PHACO AND INTRAOCULAR LENS PLACEMENT (IOC);  Surgeon: Birder Robson, MD;  Location: [facility];  Service: Ophthalmology;  Laterality: Left;  [place] 00:38   . COLONOSCOPY  12/2007   Medhoff, diverticulosis and polyp, rpt 5 yrs  . dental implants    . FORAMINOTOMY 2 LEVEL Left 10/2013   C4/5, C5/6 ([place])  . HERNIA REPAIR     right 05/1999, left 09/04/05  . JOINT REPLACEMENT Right    TKR  . KNEE ARTHROPLASTY Left 05/20/2017   Procedure: COMPUTER ASSISTED TOTAL KNEE ARTHROPLASTY;  Surgeon: Dereck Leep, MD;  Location: [facility];  Service: Orthopedics;  Laterality: Left;  . KNEE ARTHROSCOPY  09/1997   left  . POSTERIOR CERVICAL FUSION/FORAMINOTOMY N/A 11/22/2013   Procedure: LEFT C4-5 AND C5-6 FORAMINOTOMY;  Surgeon: Jessy Oto, MD;  Location: [place];  Service: Orthopedics;  Laterality: N/A;  . PROSTATECTOMY  08/1990  . 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 TONSILLECTOMY      There were no vitals filed for this visit.  Subjective Assessment - 11/17/19 1431    Subjective  Still have sciatic problems, it is worst in the morning and kinda goes away throughout the day. Overall it is better    Currently in Pain?  Yes  Pain Score  3     Pain Location  Buttocks    Pain Orientation  Right                        [facility] Adult PT Treatment/Exercise - 11/17/19 0001      Lumbar Exercises: Stretches   Active Hamstring Stretch  Left;30 seconds    Piriformis Stretch  Left;20 seconds;4 reps      Lumbar Exercises: Aerobic   Nustep  L5 x 7 min      Lumbar Exercises: Machines for Strengthening   Cybex Lumbar Extension  2x10 black TB    Leg Press  40# 2x10 BLE    Other Lumbar Machine Exercise  heel raises 40# 1x15    Other Lumbar Machine Exercise  Rows 20lb 2x10       Lumbar Exercises: Standing   Other Standing Lumbar Exercises  10# straight arm pulls with cues for posture and core activation      Traction    Min (lbs)  55    Max (lbs)  65    Hold Time  60    Rest Time  20    Time  12               PT Short Term Goals - 10/19/19 1032      PT SHORT TERM GOAL #1   Title  Pt will be independent with HEP    Time  2    Period  Weeks    Status  New    Target Date  11/02/19        PT Long Term Goals - 11/14/19 1441      PT LONG TERM GOAL #1   Title  Pt will demonstrate increase in bilat hamstring length to 90 deg or greater in order to decrease pain and increase functional ROM    Status  On-going      PT LONG TERM GOAL #2   Title  Pt will increase lumbar extension ROM to <25% limited in order to decrease pain and improve functional abilities    Status  On-going      PT LONG TERM GOAL #3   Title  Pt will increase hip abduction and hip extension strength to 4/5 in order to facilitate return to pain free ADLs    Status  On-going      PT LONG TERM GOAL #4   Title  Pt will report ability to walk >30 min with no increase in LBP    Status  On-going            Plan - 11/17/19 1508    Clinical Impression Statement  Continued with traction due to pt reports that it thinks it helps. Cues needed for core engagement with straight arm pull downs. Added seated rows he completed them well but needed cues not to lean back. Hs and piriformis remains really tight.    Examination-Activity Limitations  Bend;Carry;Lift;Sit;Squat;Stand    Examination-Participation Restrictions  Cleaning;Community Activity    Stability/Clinical Decision Making  Stable/Uncomplicated    Rehab Potential  Good    PT Frequency  2x / week    PT Duration  8 weeks    PT Treatment/Interventions  ADLs/Self Care Home Management;Electrical Stimulation;Iontophoresis 4mg /ml Dexamethasone;Moist Heat;Gait training;Stair training;Functional mobility training;Therapeutic activities;Therapeutic exercise;Balance training;Neuromuscular re-education;Manual techniques;Patient/family education;Passive range of motion    PT Next  Visit Plan  continue to work with him on flexibbility and core strength, could try  to slowly bump up the traction if tolerated       Patient will benefit from skilled therapeutic intervention in order to improve the following deficits and impairments:  Abnormal gait, Decreased range of motion, Difficulty walking, Decreased endurance, Decreased activity tolerance, Pain, Decreased balance, Hypomobility, Impaired flexibility, Decreased mobility  Visit Diagnosis: Muscle weakness (generalized)  Acute right-sided low back pain with right-sided sciatica     Problem List Patient Active Problem List   Diagnosis Date Noted  . Stiffness of multiple joints 10/10/2019  . Carotid stenosis, asymptomatic, bilateral 02/06/2019  . Vertigo 01/28/2019  . Abrasion of nose 04/23/2018  . Thoracic ascending aortic aneurysm ([place]) 10/26/2017  . S/P total knee arthroplasty 05/20/2017  . Osteoarthritis 09/22/2016  . Pulmonary nodules 09/25/2015  . CAD (coronary artery disease) 09/25/2015  . Atherosclerosis of aortic arch ([place]) 09/25/2015  . Verruca 08/17/2015  . Advanced care planning/counseling discussion 09/08/2014  . Health maintenance examination 09/08/2014  . 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 Spinal stenosis in cervical region 11/22/2013  . Medicare annual wellness visit, subsequent 06/22/2012  . Cervical stenosis of spinal canal 07/29/2011    Class: Chronic  . SHOULDER PAIN, RIGHT 04/18/2010  . History of tobacco abuse 12/02/2006  . COPD (chronic obstructive pulmonary disease) ([place]) 12/01/2006  . DIVERTICULOSIS, COLON 12/01/2006  . Chronic back pain 12/01/2006  . PROSTATE CANCER, HX OF 12/01/2006    Scot Jun, PTA 11/17/2019, 3:10 PM  [address] [place], [place], [address] Phone: [phone]   Fax:  [phone]  Name: JARVARIS DOLSEN MRN: [id number] Date of Birth:  12-17-1940

## 2019-11-22 ENCOUNTER — Encounter: Payer: Self-pay | Admitting: Physical Therapy

## 2019-11-22 ENCOUNTER — Other Ambulatory Visit: Payer: Self-pay

## 2019-11-22 ENCOUNTER — Ambulatory Visit: Payer: Medicare HMO | Admitting: Physical Therapy

## 2019-11-22 DIAGNOSIS — M5441 Lumbago with sciatica, right side: Secondary | ICD-10-CM

## 2019-11-22 DIAGNOSIS — M6281 Muscle weakness (generalized): Secondary | ICD-10-CM

## 2019-11-22 NOTE — Therapy (Signed)
[address] [place], [place], [address] Phone: [phone]   Fax:  [phone]  Physical Therapy Treatment Progress Note Reporting Period 10/19/2019 to 11/22/2019  See note below for Objective Data and Assessment of Progress/Goals.      Patient Details  Name: Carlos Olson MRN: [id number] Date of Birth: 1941-01-08 Referring Provider (PT): Ria Bush   Encounter Date: 11/22/2019  PT End of Session - 11/22/19 0851    Visit Number  10    Date for PT Re-Evaluation  12/19/19    PT Start Time  0800    PT Stop Time  0855    PT Time Calculation (min)  55 min    Activity Tolerance  Patient tolerated treatment well    Behavior During Therapy  [facility] for tasks assessed/performed       Past Medical History:  Diagnosis Date  . Atherosclerosis of aortic arch ([place]) 09/25/2015   By CT scan   . CAD (coronary artery disease) 09/25/2015   Mild 3v by CT scan   . Cancer [facility])    prostate  . Colon polyps   . COPD (chronic obstructive pulmonary disease) ([place])   . Diverticulosis of colon   . Femoral bruit    bilateral, found by Dr. Jefm Bryant  . Heart murmur    child  . History of prostate cancer   . IBS (irritable bowel syndrome)    improved after retirement  . 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 Pelvic fracture ([place])   . Pneumonia    child  . Pulmonary nodules 09/25/2015   On screening CT scan, rpt 1 yr   . Shortness of breath    doe    Past Surgical History:  Procedure Laterality Date  . ANTERIOR CERVICAL DECOMP/DISCECTOMY FUSION  07/29/2011   Procedure: ANTERIOR CERVICAL DECOMPRESSION/DISCECTOMY FUSION 1 LEVEL;  Surgeon: Jessy Oto, MD;  Location: [place];  Service: Orthopedics;  Laterality: N/A;  Anterior Cervical Discectomy and Fusion C3-4  . CATARACT EXTRACTION W/PHACO Right 07/10/2015   Procedure: CATARACT EXTRACTION PHACO AND INTRAOCULAR LENS  PLACEMENT (IOC);  Surgeon: Birder Robson, MD;  Location: [facility];  Service: Ophthalmology;  Laterality: Right;  [place] 00:58   . CATARACT EXTRACTION W/PHACO Left 07/24/2015   Procedure: CATARACT EXTRACTION PHACO AND INTRAOCULAR LENS PLACEMENT (IOC);  Surgeon: Birder Robson, MD;  Location: [facility];  Service: Ophthalmology;  Laterality: Left;  [place] 00:38   . COLONOSCOPY  12/2007   Medhoff, diverticulosis and polyp, rpt 5 yrs  . dental implants    . FORAMINOTOMY 2 LEVEL Left 10/2013   C4/5, C5/6 ([place])  . HERNIA REPAIR     right 05/1999, left 09/04/05  . JOINT REPLACEMENT Right    TKR  . KNEE ARTHROPLASTY Left 05/20/2017   Procedure: COMPUTER ASSISTED TOTAL KNEE ARTHROPLASTY;  Surgeon: Dereck Leep, MD;  Location: [facility];  Service: Orthopedics;  Laterality: Left;  . KNEE ARTHROSCOPY  09/1997   left  . POSTERIOR CERVICAL FUSION/FORAMINOTOMY N/A 11/22/2013   Procedure: LEFT C4-5 AND C5-6 FORAMINOTOMY;  Surgeon: Jessy Oto, MD;  Location: [place];  Service: Orthopedics;  Laterality: N/A;  . PROSTATECTOMY  08/1990  . 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 TONSILLECTOMY      There were no vitals filed for this visit.  Subjective Assessment - 11/22/19 0804    Subjective  Pt reports he thinks the traction is helping, has  been going for 3/4-52mle walks again, and is feeling better overall. States he is going on a trip week after next and would like to schedule one more appt after.    Currently in Pain?  Yes    Pain Score  3     Pain Location  Back         [facility] PT Assessment - 11/22/19 0001      Strength   Right Hip Extension  4-/5    Right Hip ABduction  4-/5    Left Hip Extension  4-/5    Left Hip ABduction  4-/5                    [facility] Adult PT Treatment/Exercise - 11/22/19 0001      Lumbar Exercises: Aerobic   Nustep  L5 x 8 min      Lumbar Exercises: Machines for Strengthening   Cybex Knee Extension  10# 2x10    Cybex Knee Flexion  25# 2x10    Leg Press   40# 2x10 BLE    Other Lumbar Machine Exercise  heel raises 40# 1x15    Other Lumbar Machine Exercise  rows and lats 20# 2x15      Traction   Type of Traction  Lumbar    Min (lbs)  55    Max (lbs)  65    Hold Time  60    Rest Time  20    Time  12               PT Short Term Goals - 11/22/19 0806      PT SHORT TERM GOAL #1   Title  Pt will be independent with HEP    Time  2    Period  Weeks    Status  Achieved        PT Long Term Goals - 11/22/19 02585     PT LONG TERM GOAL #1   Title  Pt will demonstrate increase in bilat hamstring length to 90 deg or greater in order to decrease pain and increase functional ROM    Baseline  75    Status  On-going      PT LONG TERM GOAL #2   Title  Pt will increase lumbar extension ROM to <25% limited in order to decrease pain and improve functional abilities    Status  On-going      PT LONG TERM GOAL #3   Title  Pt will increase hip abduction and hip extension strength to 4/5 in order to facilitate return to pain free ADLs    Baseline  4-/5    Status  Partially Met      PT LONG TERM GOAL #4   Title  Pt will report ability to walk >30 min with no increase in LBP    Status  Achieved      PT LONG TERM GOAL #5   Title  Pt will report resolution of radiating pain down RLE    Baseline  only has radiating pain in the morning; goes away as day progresses    Status  Partially Met            Plan - 11/22/19 02778   Clinical Impression Statement  Pt did well with progression of TE today. Continues to demonstrate lumbar and LE flexibility deficits particularly in B hamstring. Pt is progressing towards goals; reports he is able to walk >1 mile with no increase in  LBP. Continued traction d/t pt reports that it helps. Continue to progress towards goals.    PT Treatment/Interventions  ADLs/Self Care Home Management;Electrical Stimulation;Iontophoresis 46m/ml Dexamethasone;Moist Heat;Gait training;Stair training;Functional mobility  training;Therapeutic activities;Therapeutic exercise;Balance training;Neuromuscular re-education;Manual techniques;Patient/family education;Passive range of motion    PT Next Visit Plan  continue to work with him on flexibbility and core strength, could try to slowly bump up the traction if tolerated    PT Home Exercise Plan  seated hamstring stretch, standing quad stretch, supine bridges, supine piriformis stretch, prone press ups, standing extension, thoracic open book rotations    Consulted and Agree with Plan of Care  Patient       Patient will benefit from skilled therapeutic intervention in order to improve the following deficits and impairments:  Abnormal gait, Decreased range of motion, Difficulty walking, Decreased endurance, Decreased activity tolerance, Pain, Decreased balance, Hypomobility, Impaired flexibility, Decreased mobility  Visit Diagnosis: Muscle weakness (generalized)  Acute right-sided low back pain with right-sided sciatica     Problem List Patient Active Problem List   Diagnosis Date Noted  . Stiffness of multiple joints 10/10/2019  . Carotid stenosis, asymptomatic, bilateral 02/06/2019  . Vertigo 01/28/2019  . Abrasion of nose 04/23/2018  . Thoracic ascending aortic aneurysm ([place] 10/26/2017  . S/P total knee arthroplasty 05/20/2017  . Osteoarthritis 09/22/2016  . Pulmonary nodules 09/25/2015  . CAD (coronary artery disease) 09/25/2015  . Atheros[place] of [place] ([place] 09/25/2015  . Verruca 08/17/2015  . Advanced care planning/counseling discussion 09/08/2014  . Health maintenance examination 09/08/2014  . 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 Spinal stenosis in cervical region 11/22/2013  . Medicare annual wellness visit, subsequent 06/22/2012  . Cervical stenosis of spinal canal 07/29/2011    Class: Chronic  . SHOULDER PAIN, RIGHT 04/18/2010  . History of tobacco abuse 12/02/2006  . COPD (chronic obstructive  pulmonary disease) ([place] 12/01/2006  . DIVERTICULOSIS, COLON 12/01/2006  . Chronic back pain 12/01/2006  . PROSTATE CANCER, HX OF 12/01/2006   AAmador Cunas PT, DPT ADonald ProseSugg 11/22/2019, 9:02 AM  [address] [address]Phone: [phone]  Fax:  [phone] Name: Carlos BRUMBAUGHMRN: [id number] of Birth: 103-01-1941

## 2019-11-24 ENCOUNTER — Ambulatory Visit: Payer: Medicare HMO | Admitting: Physical Therapy

## 2019-11-24 ENCOUNTER — Encounter: Payer: Self-pay | Admitting: Physical Therapy

## 2019-11-24 ENCOUNTER — Other Ambulatory Visit: Payer: Self-pay

## 2019-11-24 DIAGNOSIS — M6281 Muscle weakness (generalized): Secondary | ICD-10-CM

## 2019-11-24 DIAGNOSIS — M5441 Lumbago with sciatica, right side: Secondary | ICD-10-CM | POA: Diagnosis not present

## 2019-11-24 NOTE — Therapy (Signed)
[address] [place], [place], [address] Phone: [phone]   Fax:  [phone]  Physical Therapy Treatment  Patient Details  Name: Carlos Olson MRN: [id number] Date of Birth: Mar 29, 1941 Referring Provider (PT): Ria Bush   Encounter Date: 11/24/2019  PT End of Session - 11/24/19 0845    Visit Number  11    Date for PT Re-Evaluation  12/19/19    PT Start Time  0800    PT Stop Time  0858    PT Time Calculation (min)  58 min    Activity Tolerance  Patient tolerated treatment well    Behavior During Therapy  [facility] for tasks assessed/performed       Past Medical History:  Diagnosis Date  . Atherosclerosis of aortic arch ([place]) 09/25/2015   By CT scan   . CAD (coronary artery disease) 09/25/2015   Mild 3v by CT scan   . Cancer [facility])    prostate  . Colon polyps   . COPD (chronic obstructive pulmonary disease) ([place])   . Diverticulosis of colon   . Femoral bruit    bilateral, found by Dr. Jefm Bryant  . Heart murmur    child  . History of prostate cancer   . IBS (irritable bowel syndrome)    improved after retirement  . 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 Pelvic fracture ([place])   . Pneumonia    child  . Pulmonary nodules 09/25/2015   On screening CT scan, rpt 1 yr   . Shortness of breath    doe    Past Surgical History:  Procedure Laterality Date  . ANTERIOR CERVICAL DECOMP/DISCECTOMY FUSION  07/29/2011   Procedure: ANTERIOR CERVICAL DECOMPRESSION/DISCECTOMY FUSION 1 LEVEL;  Surgeon: Jessy Oto, MD;  Location: [place];  Service: Orthopedics;  Laterality: N/A;  Anterior Cervical Discectomy and Fusion C3-4  . CATARACT EXTRACTION W/PHACO Right 07/10/2015   Procedure: CATARACT EXTRACTION PHACO AND INTRAOCULAR LENS PLACEMENT (IOC);  Surgeon: Birder Robson, MD;  Location: [facility];  Service: Ophthalmology;  Laterality: Right;  [place] 00:58   . CATARACT  EXTRACTION W/PHACO Left 07/24/2015   Procedure: CATARACT EXTRACTION PHACO AND INTRAOCULAR LENS PLACEMENT (IOC);  Surgeon: Birder Robson, MD;  Location: [facility];  Service: Ophthalmology;  Laterality: Left;  [place] 00:38   . COLONOSCOPY  12/2007   Medhoff, diverticulosis and polyp, rpt 5 yrs  . dental implants    . FORAMINOTOMY 2 LEVEL Left 10/2013   C4/5, C5/6 ([place])  . HERNIA REPAIR     right 05/1999, left 09/04/05  . JOINT REPLACEMENT Right    TKR  . KNEE ARTHROPLASTY Left 05/20/2017   Procedure: COMPUTER ASSISTED TOTAL KNEE ARTHROPLASTY;  Surgeon: Dereck Leep, MD;  Location: [facility];  Service: Orthopedics;  Laterality: Left;  . KNEE ARTHROSCOPY  09/1997   left  . POSTERIOR CERVICAL FUSION/FORAMINOTOMY N/A 11/22/2013   Procedure: LEFT C4-5 AND C5-6 FORAMINOTOMY;  Surgeon: Jessy Oto, MD;  Location: [place];  Service: Orthopedics;  Laterality: N/A;  . PROSTATECTOMY  08/1990  . 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 TONSILLECTOMY      There were no vitals filed for this visit.  Subjective Assessment - 11/24/19 0758    Subjective  Pt reports that the traction is helping and that he only has a little radiating pain; "can barely feel it."    Currently in Pain?  Yes  Pain Score  2     Pain Location  Back                        [facility] Adult PT Treatment/Exercise - 11/24/19 0001      Exercises   Exercises  Knee/Hip      Lumbar Exercises: Stretches   Single Knee to Chest Stretch  Right;Left;2 reps;60 seconds    Single Knee to Chest Stretch Limitations  with rotation B    Gastroc Stretch  Right;Left;1 rep;30 seconds      Lumbar Exercises: Aerobic   Nustep  L5 x 8 min      Lumbar Exercises: Machines for Strengthening   Cybex Knee Extension  10# 2x10    Cybex Knee Flexion  25# 2x10    Leg Press  40# 2x10 BLE    Other Lumbar Machine Exercise  heel raises x15 standing, shoulder extension 10# 2x10, AR press 15# 1x10 B    Other Lumbar Machine Exercise  rows and  lats 25# and 35# 2x15      Knee/Hip Exercises: Stretches   Soleus Stretch  Both;1 rep;30 seconds      Traction   Type of Traction  Lumbar    Min (lbs)  55    Max (lbs)  65    Hold Time  60    Rest Time  20    Time  12               PT Short Term Goals - 11/22/19 0806      PT SHORT TERM GOAL #1   Title  Pt will be independent with HEP    Time  2    Period  Weeks    Status  Achieved        PT Long Term Goals - 11/22/19 1914      PT LONG TERM GOAL #1   Title  Pt will demonstrate increase in bilat hamstring length to 90 deg or greater in order to decrease pain and increase functional ROM    Baseline  75    Status  On-going      PT LONG TERM GOAL #2   Title  Pt will increase lumbar extension ROM to <25% limited in order to decrease pain and improve functional abilities    Status  On-going      PT LONG TERM GOAL #3   Title  Pt will increase hip abduction and hip extension strength to 4/5 in order to facilitate return to pain free ADLs    Baseline  4-/5    Status  Partially Met      PT LONG TERM GOAL #4   Title  Pt will report ability to walk >30 min with no increase in LBP    Status  Achieved      PT LONG TERM GOAL #5   Title  Pt will report resolution of radiating pain down RLE    Baseline  only has radiating pain in the morning; goes away as day progresses    Status  Partially Met            Plan - 11/24/19 0846    Clinical Impression Statement  Pt is progressing well with reports of decreased radiating pain overall. Tolerated progression of TE with no reports of increase in LBP. Continued traction d/t pt report that it helps. Continue to progress towards goals.    PT Treatment/Interventions  ADLs/Self Care Home Management;Electrical Stimulation;Iontophoresis  57m/ml Dexamethasone;Moist Heat;Gait training;Stair training;Functional mobility training;Therapeutic activities;Therapeutic exercise;Balance training;Neuromuscular re-education;Manual  techniques;Patient/family education;Passive range of motion    PT Next Visit Plan  continue to work with him on flexibbility and core strength, could try to slowly bump up the traction if tolerated    Consulted and Agree with Plan of Care  Patient       Patient will benefit from skilled therapeutic intervention in order to improve the following deficits and impairments:  Abnormal gait, Decreased range of motion, Difficulty walking, Decreased endurance, Decreased activity tolerance, Pain, Decreased balance, Hypomobility, Impaired flexibility, Decreased mobility  Visit Diagnosis: Muscle weakness (generalized)  Acute right-sided low back pain with right-sided sciatica     Problem List Patient Active Problem List   Diagnosis Date Noted  . Stiffness of multiple joints 10/10/2019  . Carotid stenosis, asymptomatic, bilateral 02/06/2019  . Vertigo 01/28/2019  . Abrasion of nose 04/23/2018  . Thoracic ascending aortic aneurysm ([place] 10/26/2017  . S/P total knee arthroplasty 05/20/2017  . Osteoarthritis 09/22/2016  . Pulmonary nodules 09/25/2015  . CAD (coronary artery disease) 09/25/2015  . Atherosclerosis of aortic arch ([place] 09/25/2015  . Verruca 08/17/2015  . Advanced care planning/counseling discussion 09/08/2014  . Health maintenance examination 09/08/2014  . 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 Spinal stenosis in cervical region 11/22/2013  . Medicare annual wellness visit, subsequent 06/22/2012  . Cervical stenosis of spinal canal 07/29/2011    Class: Chronic  . SHOULDER PAIN, RIGHT 04/18/2010  . History of tobacco abuse 12/02/2006  . COPD (chronic obstructive pulmonary disease) ([place] 12/01/2006  . DIVERTICULOSIS, COLON 12/01/2006  . Chronic back pain 12/01/2006  . PROSTATE CANCER, HX OF 12/01/2006   AAmador Cunas PT, DPT ADonald ProseSugg 11/24/2019, 8:49 AM  [address] [place] [address]Phone: [phone]  Fax:  [phone] Name: Carlos GUARINOMRN: [id number] of Birth: [birth year]

## 2019-11-25 ENCOUNTER — Ambulatory Visit: Payer: Medicare HMO | Admitting: Pulmonary Disease

## 2019-11-25 ENCOUNTER — Encounter: Payer: Self-pay | Admitting: Pulmonary Disease

## 2019-11-25 VITALS — BP 118/74 | HR 63 | Temp 98.5°F | Ht 75.0 in | Wt 212.0 lb

## 2019-11-25 DIAGNOSIS — J432 Centrilobular emphysema: Secondary | ICD-10-CM | POA: Diagnosis not present

## 2019-11-25 DIAGNOSIS — I712 Thoracic aortic aneurysm, without rupture: Secondary | ICD-10-CM

## 2019-11-25 DIAGNOSIS — I7121 Aneurysm of the ascending aorta, without rupture: Secondary | ICD-10-CM

## 2019-11-25 DIAGNOSIS — R918 Other nonspecific abnormal finding of lung field: Secondary | ICD-10-CM | POA: Diagnosis not present

## 2019-11-25 MED ORDER — TRELEGY ELLIPTA 100-62.5-25 MCG/INH IN AEPB: 1.0000 | INHALATION_SPRAY | Freq: Every day | RESPIRATORY_TRACT | 5 refills | Status: DC

## 2019-11-25 NOTE — Assessment & Plan Note (Signed)
Reviewed June/2020 pulmonary function test with patient Reviewed Breo Ellipta versus Trelegy Ellipta inhaler Mild peripheral eosinophilia  Plan: Stop Breo Ellipta 100 Start Trelegy Ellipta 100 Continue to increase overall physical activity Okay to use albuterol rescue inhaler as needed for shortness of breath or wheezing 85-month follow-up in office

## 2019-11-25 NOTE — Assessment & Plan Note (Signed)
Plan: Keep follow-up with Dr. Cherylann Ratel

## 2019-11-25 NOTE — Assessment & Plan Note (Signed)
Last evaluated by Dr. Lamonte Sakai in September/2020.  Pulmonary nodules were stable at that time.  Recommendation would be to have CT imaging follow-up with his aneurysm in 2022.  As managed by Dr. Cyndia Bent with [facility]

## 2019-11-25 NOTE — Progress Notes (Signed)
@Patient  ID: Carlos Olson, male    DOB: Aug 24, 1940, 79 y.o.   MRN: [id number]  Chief Complaint  Patient presents with  . Follow-up    F/U for emphysema. Had to switch from Trelegy to [facility] due to cost. Has not noticed a difference in his breathing. Still having issues with SOB.     Referring provider: Ria Bush, MD  HPI:  79 year old male former smoker followed in our office for COPD  PMH: CAD, arthrosclerosis, diverticulosis, Smoker/ Smoking History: Former smoker.  Quit 2008.  104-pack-year smoking history. Maintenance: Breo Ellipta 100 Pt of: Dr. Lamonte Sakai  11/25/2019  - Visit   79 year old male former smoker followed in our office for COPD.  He is followed in our office by Dr. Lamonte Sakai.  He was last seen in September/2020 this is a stable office visit.  He had previously been transition from [place] to [place] 100.  He was having rare albuterol use.  He was encouraged to follow-up with our office in 6 months.  He has a history of pulmonary nodules have been following these on low-dose screening CTs.  He has now aged out of that program.  But will continue to have interval CT chest managed by cardiology to follow his aneurysm in 2022.   Patient reporting to our office today that he feels like his dyspnea has slightly worsened.  He also is coughing slightly more with a little bit more baseline mucus.  Mucus color is still white.   Patient admits that he previously wanted to switch back to [facility] because it would help slow him ending up in the donut hole.  He is now feeling like he may benefit from being back on Trelegy Ellipta.  He would like to discuss this today.  Questionaires / Pulmonary Flowsheets:   MMRC: mMRC Dyspnea Scale mMRC Score  11/25/2019 1   Tests:   01/25/2019-CBC with differential-eosinophils relative 3.4, eosinophils absolute 0.2  12/06/2018-pulmonary function test-FVC 4.87 (97% predicted), postbronchodilator ratio 50, postbronchodilator  FEV1 2.37 (65% predicted), no bronchodilator response, DLCO 16.28 (56% predicted)  FENO:  No results found for: NITRICOXIDE  PFT: PFT Results Latest Ref Rng & Units 12/06/2018  FVC-Pre L 4.87  FVC-Predicted Pre % 97  FVC-Post L 4.73  FVC-Predicted Post % 94  Pre FEV1/FVC % % 48  Post FEV1/FCV % % 50  FEV1-Pre L 2.35  FEV1-Predicted Pre % 65  FEV1-Post L 2.37  DLCO UNC% % 56  DLCO COR %Predicted % 52  TLC L 9.67  TLC % Predicted % 119  RV % Predicted % 146    WALK:  SIX MIN WALK 12/06/2018 08/06/2018  Supplimental Oxygen during Test? (L/min) - No  Tech Comments: Patient walked 3 laps. Denies SOB or chest discomfort. Tolerated well.  -    Imaging: No results found.  Lab Results:  CBC    Component Value Date/Time   WBC 5.1 01/25/2019 0818   RBC 4.28 01/25/2019 0818   HGB 14.3 01/25/2019 0818   HGB 12.1 (L) 08/20/2012 0610   HCT 42.8 01/25/2019 0818   HCT 44.0 08/02/2012 0853   PLT 172.0 01/25/2019 0818   PLT 158 08/20/2012 0610   MCV 99.9 01/25/2019 0818   MCV 99 08/02/2012 0853   MCH 33.4 05/07/2017 0949   MCHC 33.5 01/25/2019 0818   RDW 13.7 01/25/2019 0818   RDW 13.7 08/02/2012 0853   LYMPHSABS 1.1 01/25/2019 0818   MONOABS 0.7 01/25/2019 0818   EOSABS 0.2 01/25/2019 0818  BASOSABS 0.1 01/25/2019 0818    BMET    Component Value Date/Time   NA 139 01/25/2019 0818   NA 136 08/20/2012 0610   K 4.2 01/25/2019 0818   K 4.2 08/20/2012 0610   CL 102 01/25/2019 0818   CL 102 08/20/2012 0610   CO2 29 01/25/2019 0818   CO2 26 08/20/2012 0610   GLUCOSE 93 01/25/2019 0818   GLUCOSE 123 (H) 08/20/2012 0610   BUN 14 01/25/2019 0818   BUN 7 08/20/2012 0610   CREATININE 0.96 01/25/2019 0818   CREATININE 0.75 08/20/2012 0610   CALCIUM 9.4 01/25/2019 0818   CALCIUM 8.4 (L) 08/20/2012 0610   GFRNONAA >60 05/07/2017 0949   GFRNONAA >60 08/20/2012 0610   GFRAA >60 05/07/2017 0949   GFRAA >60 08/20/2012 0610    BNP No results found for: BNP  ProBNP No  results found for: PROBNP  Specialty Problems      Pulmonary Problems   COPD (chronic obstructive pulmonary disease) (HCC)    Centrilobular and paraseptal emphysema by CT Spirometry 04/23/2018: Moderate airway obstruction Post bronchodilator test not clearly improved. Pre: FVC 75%, FEV1 52%, ratio 0.5 Post: FVC 74%, FEV1 54%, ratio 0.52  01/25/2019-CBC with differential-eosinophils relative 3.4, eosinophils absolute 0.2  12/06/2018-pulmonary function test-FVC 4.87 (97% predicted), postbronchodilator ratio 50, postbronchodilator FEV1 2.37 (65% predicted), no bronchodilator response, DLCO 16.28 (56% predicted)       Pulmonary nodules    On screening CT scan, rpt 1 yr         Allergies  Allergen Reactions  . Sulfonamide Derivatives Itching  . Sulfasalazine Itching    Immunization History  Administered Date(s) Administered  . Fluad Quad(high Dose 65+) 02/23/2019  . Influenza Split 03/27/2011  . Influenza Whole 04/27/2001, 03/19/2009, 03/30/2013  . Influenza, High Dose Seasonal PF 04/21/2014, 04/11/2016  . Influenza,inj,Quad PF,6+ Mos 04/23/2018  . Influenza-Unspecified 03/31/2015, 04/14/2017  . PFIZER SARS-COV-2 Vaccination 07/21/2019, 08/11/2019  . Pneumococcal Conjugate-13 09/08/2014  . Pneumococcal Polysaccharide-23 04/27/2001, 06/22/2012  . Td 08/28/1998, 04/19/2009  . Zoster 03/27/2011  . Zoster Recombinat (Shingrix) 09/29/2017, 01/20/2018    Past Medical History:  Diagnosis Date  . 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 CAD (coronary artery disease) 09/25/2015   Mild 3v by CT scan   . Cancer [facility])    prostate  . Colon polyps   . COPD (chronic obstructive pulmonary disease) ([place])   . Diverticulosis of colon   . Femoral bruit    bilateral, found by Dr. Jefm Bryant  . Heart murmur    child  . History of prostate cancer   . IBS (irritable bowel syndrome)    improved after retirement  . Osteoarthritis    knees and cervical/lumbar spine  s/p surgeries and injections  . Pelvic fracture ([place])   . Pneumonia    child  . Pulmonary nodules 09/25/2015   On screening CT scan, rpt 1 yr   . Shortness of breath    doe    Tobacco History: Social History   Tobacco Use  Smoking Status Former Smoker  . Packs/day: 2.00  . Years: 52.00  . Pack years: 104.00  . Types: Cigarettes  . Quit date: 05/15/2007  . Years since quitting: 12.5  Smokeless Tobacco Never Used  Tobacco Comment   quit 06/16/2007/encouraged to remain smoke free   Counseling given: Yes Comment: quit 06/16/2007/encouraged to remain smoke free   Continue to not smoke  Outpatient Encounter Medications as of 11/25/2019  Medication Sig  . acetaminophen (TYLENOL) 500 MG tablet Take 1,000 mg by mouth 3 (three) times daily as needed for moderate pain or headache.   . albuterol (VENTOLIN HFA) 108 (90 Base) MCG/ACT inhaler Inhale 2 puffs into the lungs every 6 (six) hours as needed for wheezing or shortness of breath.  Marland Kitchen aspirin EC 81 MG tablet Take 1 tablet (81 mg total) by mouth daily.  . [DISCONTINUED] fluticasone furoate-vilanterol (BREO ELLIPTA) 100-25 MCG/INH AEPB Inhale 1 puff into the lungs daily.  . Fluticasone-Umeclidin-Vilant (TRELEGY ELLIPTA) 100-62.5-25 MCG/INH AEPB Inhale 1 puff into the lungs daily.   No facility-administered encounter medications on file as of 11/25/2019.     Review of Systems  Review of Systems  Constitutional: Negative for activity change, chills, fatigue, fever and unexpected weight change.  HENT: Negative for postnasal drip, rhinorrhea, sinus pressure, sinus pain and sore throat.   Eyes: Negative.   Respiratory: Positive for shortness of breath. Negative for cough and wheezing.   Cardiovascular: Negative for chest pain and palpitations.  Gastrointestinal: Negative for constipation, diarrhea, nausea and vomiting.  Endocrine: Negative.   Genitourinary: Negative.   Musculoskeletal: Negative.   Skin: Negative.   Neurological:  Negative for dizziness and headaches.  Psychiatric/Behavioral: Negative.  Negative for dysphoric mood. The patient is not nervous/anxious.   All other systems reviewed and are negative.    Physical Exam  BP 118/74   Pulse 63   Temp 98.5 F (36.9 C) (Oral)   Ht 6\' 3"  (1.905 m)   Wt 212 lb (96.2 kg)   SpO2 98% Comment: on RA  BMI 26.50 kg/m   Wt Readings from Last 5 Encounters:  11/25/19 212 lb (96.2 kg)  10/07/19 213 lb (96.6 kg)  03/15/19 215 lb (97.5 kg)  01/27/19 213 lb 1 oz (96.6 kg)  01/05/19 215 lb (97.5 kg)    BMI Readings from Last 5 Encounters:  11/25/19 26.50 kg/m  10/07/19 26.62 kg/m  03/15/19 26.87 kg/m  01/27/19 26.28 kg/m  01/05/19 26.87 kg/m    Physical Exam Vitals and nursing note reviewed.  Constitutional:      General: He is not in acute distress.    Appearance: Normal appearance. He is normal weight.  HENT:     Head: Normocephalic and atraumatic.     Right Ear: Hearing and external ear normal.     Left Ear: Hearing and external ear normal.     Nose: Nose normal. No mucosal edema or rhinorrhea.     Right Turbinates: Not enlarged.     Left Turbinates: Not enlarged.     Mouth/Throat:     Mouth: Mucous membranes are dry.     Pharynx: Oropharynx is clear. No oropharyngeal exudate.  Eyes:     Pupils: Pupils are equal, round, and reactive to light.  Cardiovascular:     Rate and Rhythm: Normal rate and regular rhythm.     Pulses: Normal pulses.     Heart sounds: Normal heart sounds. No murmur.  Pulmonary:     Effort: Pulmonary effort is normal.     Breath sounds: No decreased breath sounds, wheezing or rales.  Musculoskeletal:     Cervical back: Normal range of motion.     Right lower leg: No edema.     Left lower leg: No edema.  Lymphadenopathy:     Cervical: No cervical adenopathy.  Skin:    General: Skin is warm and dry.     Capillary Refill: Capillary refill takes less than 2 seconds.  Findings: No erythema or rash.    Neurological:     General: No focal deficit present.     Mental Status: He is alert and oriented to person, place, and time.     Motor: No weakness.     Coordination: Coordination normal.     Gait: Gait is intact. Gait normal.  Psychiatric:        Mood and Affect: Mood normal.        Behavior: Behavior normal. Behavior is cooperative.        Thought Content: Thought content normal.        Judgment: Judgment normal.     Assessment & Plan:   COPD (chronic obstructive pulmonary disease) ([place]) Reviewed June/2020 pulmonary function test with patient Reviewed Breo Ellipta versus Trelegy Ellipta inhaler Mild peripheral eosinophilia  Plan: Stop Breo Ellipta 100 Start Trelegy Ellipta 100 Continue to increase overall physical activity Okay to use albuterol rescue inhaler as needed for shortness of breath or wheezing 20-month follow-up in office   Thoracic ascending aortic aneurysm (HCC) Plan: Keep follow-up with Dr. Cherylann Ratel   Pulmonary nodules Last evaluated by Dr. Lamonte Sakai in September/2020.  Pulmonary nodules were stable at that time.  Recommendation would be to have CT imaging follow-up with his aneurysm in 2022.  As managed by Dr. Cyndia Bent with [facility]    Return in about 4 months (around 03/27/2020), or if symptoms worsen or fail to improve, for Follow up with Dr. Lamonte Sakai.   Lauraine Rinne, NP 11/25/2019   This appointment required 32 minutes of patient care (this includes precharting, chart review, review of results, face-to-face care, etc.).

## 2019-11-25 NOTE — Patient Instructions (Addendum)
You were seen today by Lauraine Rinne, NP  for:  1. Centrilobular emphysema ([place])  Start Trelegy Ellipta  >>> 1 puff daily in the morning >>>rinse mouth out after use  >>> This inhaler contains 3 medications that help manage her respiratory status, contact our office if you cannot afford this medication or unable to remain on this medication  Hold Breo Ellipta  Only use your albuterol as a rescue medication to be used if you can't catch your breath by resting or doing a relaxed purse lip breathing pattern.  - The less you use it, the better it will work when you need it. - Ok to use up to 2 puffs  every 4 hours if you must but call for immediate appointment if use goes up over your usual need - Don't leave home without it !!  (think of it like the spare tire for your car)   Note your daily symptoms > remember "red flags" for COPD:   >>>Increase in cough >>>increase in sputum production >>>increase in shortness of breath or activity  intolerance.   If you notice these symptoms, please call the office to be seen.    Continue to work on increasing your physical activity   2. Thoracic ascending aortic aneurysm [facility])  Continue CT imaging follow-up as managed by Dr. Caffie Pinto   Follow Up:    No follow-ups on file.   Please do your part to reduce the spread of COVID-19:      Reduce your risk of any infection  and COVID19 by using the similar precautions used for avoiding the common cold or flu:  Marland Kitchen Wash your hands often with soap and warm water for at least 20 seconds.  If soap and water are not readily available, use an alcohol-based hand sanitizer with at least 60% alcohol.  . If coughing or sneezing, cover your mouth and nose by coughing or sneezing into the elbow areas of your shirt or coat, into a tissue or into your sleeve (not your hands). Langley Gauss A MASK when in public  . Avoid shaking hands with others and consider head nods or verbal greetings only. . Avoid touching your  eyes, nose, or mouth with unwashed hands.  . Avoid close contact with people who are sick. . Avoid places or events with large numbers of people in one location, like concerts or sporting events. . If you have some symptoms but not all symptoms, continue to monitor at home and seek medical attention if your symptoms worsen. . If you are having a medical emergency, call 911.   [place] / e-Visit: [email or web address]         [facility]: [facility]: [id number]                   [facility]: [id number]     It is flu season:   >>> Best ways to protect herself from the flu: Receive the yearly flu vaccine, practice good hand hygiene washing with soap and also using hand sanitizer when available, eat a nutritious meals, get adequate rest, hydrate appropriately   Please contact the office if your symptoms worsen or you have concerns that you are not improving.   Thank you for choosing [facility] for your healthcare, and for allowing [place] to partner with you on your healthcare journey. I am thankful to be able to provide care to you  today.   Wyn Quaker FNP-C     COPD and Physical Activity Chronic obstructive pulmonary disease (COPD) is a long-term (chronic) condition that affects the lungs. COPD is a general term that can be used to describe many different lung problems that cause lung swelling (inflammation) and limit airflow, including chronic bronchitis and emphysema. The main symptom of COPD is shortness of breath, which makes it harder to do even simple tasks. This can also make it harder to exercise and be active. Talk with your health care provider about treatments to help you breathe better and actions you can take to prevent breathing problems during physical activity. What are the benefits of exercising with  COPD? Exercising regularly is an important part of a healthy lifestyle. You can still exercise and do physical activities even though you have COPD. Exercise and physical activity improve your shortness of breath by increasing blood flow (circulation). This causes your heart to pump more oxygen through your body. Moderate exercise can improve your:  Oxygen use.  Energy level.  Shortness of breath.  Strength in your breathing muscles.  Heart health.  Sleep.  Self-esteem and feelings of self-worth.  Depression, stress, and anxiety levels. Exercise can benefit everyone with COPD. The severity of your disease may affect how hard you can exercise, especially at first, but everyone can benefit. Talk with your health care provider about how much exercise is safe for you, and which activities and exercises are safe for you. What actions can I take to prevent breathing problems during physical activity?  Sign up for a pulmonary rehabilitation program. This type of program may include: ? Education about lung diseases. ? Exercise classes that teach you how to exercise and be more active while improving your breathing. This usually involves:  Exercise using your lower extremities, such as a stationary bicycle.  About 30 minutes of exercise, 2 to 5 times per week, for 6 to 12 weeks  Strength training, such as push ups or leg lifts. ? Nutrition education. ? Group classes in which you can talk with others who also have COPD and learn ways to manage stress.  If you use an oxygen tank, you should use it while you exercise. Work with your health care provider to adjust your oxygen for your physical activity. Your resting flow rate is different from your flow rate during physical activity.  While you are exercising: ? Take slow breaths. ? Pace yourself and do not try to go too fast. ? Purse your lips while breathing out. Pursing your lips is similar to a kissing or whistling position. ? If doing  exercise that uses a quick burst of effort, such as weight lifting:  Breathe in before starting the exercise.  Breathe out during the hardest part of the exercise (such as raising the weights). Where to find support You can find support for exercising with COPD from:  Your health care provider.  A pulmonary rehabilitation program.  Your local health department or community health programs.  Support groups, online or in-person. Your health care provider may be able to recommend support groups. Where to find more information You can find more information about exercising with COPD from:  American Lung Association: ClassInsider.se.  COPD Foundation: https://www.rivera.net/. Contact a health care provider if:  Your symptoms get worse.  You have chest pain.  You have nausea.  You have a fever.  You have trouble talking or catching your breath.  You want to start a new exercise program or a  new activity. Summary  COPD is a general term that can be used to describe many different lung problems that cause lung swelling (inflammation) and limit airflow. This includes chronic bronchitis and emphysema.  Exercise and physical activity improve your shortness of breath by increasing blood flow (circulation). This causes your heart to provide more oxygen to your body.  Contact your health care provider before starting any exercise program or new activity. Ask your health care provider what exercises and activities are safe for you. This information is not intended to replace advice given to you by your health care provider. Make sure you discuss any questions you have with your health care provider. Document Revised: 10/06/2018 Document Reviewed: 07/09/2017 Elsevier Patient Education  2020 [employer].

## 2019-11-29 DIAGNOSIS — H52223 Regular astigmatism, bilateral: Secondary | ICD-10-CM | POA: Diagnosis not present

## 2019-11-30 ENCOUNTER — Other Ambulatory Visit: Payer: Self-pay

## 2019-11-30 ENCOUNTER — Ambulatory Visit: Payer: Medicare HMO | Attending: Family Medicine | Admitting: Physical Therapy

## 2019-11-30 ENCOUNTER — Encounter: Payer: Self-pay | Admitting: Physical Therapy

## 2019-11-30 DIAGNOSIS — M6281 Muscle weakness (generalized): Secondary | ICD-10-CM | POA: Diagnosis not present

## 2019-11-30 DIAGNOSIS — M5441 Lumbago with sciatica, right side: Secondary | ICD-10-CM | POA: Diagnosis not present

## 2019-11-30 NOTE — Therapy (Signed)
[place] [address] [place], [place], [address] Phone: [phone]   Fax:  [phone]  Physical Therapy Treatment  Patient Details  Name: Carlos Olson MRN: [id number] Date of Birth: [birth year] Referring Provider (PT): Ria Bush   Encounter Date: 11/30/2019  PT End of Session - 11/30/19 0838    Visit Number  12    Date for PT Re-Evaluation  12/19/19    PT Start Time  0758    PT Stop Time  0849    PT Time Calculation (min)  51 min    Activity Tolerance  Patient tolerated treatment well    Behavior During Therapy  [facility] for tasks assessed/performed       Past Medical History:  Diagnosis Date  . Atherosclerosis of aortic arch ([place]) 09/25/2015   By CT scan   . CAD (coronary artery disease) 09/25/2015   Mild 3v by CT scan   . Cancer [facility])    prostate  . Colon polyps   . COPD (chronic obstructive pulmonary disease) ([place])   . Diverticulosis of colon   . Femoral bruit    bilateral, found by Dr. Jefm Bryant  . Heart murmur    child  . History of prostate cancer   . IBS (irritable bowel syndrome)    improved after retirement  . 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 Pelvic fracture ([place])   . Pneumonia    child  . Pulmonary nodules 09/25/2015   On screening CT scan, rpt 1 yr   . Shortness of breath    doe    Past Surgical History:  Procedure Laterality Date  . ANTERIOR CERVICAL DECOMP/DISCECTOMY FUSION  07/29/2011   Procedure: ANTERIOR CERVICAL DECOMPRESSION/DISCECTOMY FUSION 1 LEVEL;  Surgeon: Jessy Oto, MD;  Location: [place];  Service: Orthopedics;  Laterality: N/A;  Anterior Cervical Discectomy and Fusion C3-4  . CATARACT EXTRACTION W/PHACO Right 07/10/2015   Procedure: CATARACT EXTRACTION PHACO AND INTRAOCULAR LENS PLACEMENT (IOC);  Surgeon: Birder Robson, MD;  Location: [facility];  Service: Ophthalmology;  Laterality: Right;  [place] 00:58   . CATARACT  EXTRACTION W/PHACO Left 07/24/2015   Procedure: CATARACT EXTRACTION PHACO AND INTRAOCULAR LENS PLACEMENT (IOC);  Surgeon: Birder Robson, MD;  Location: [facility];  Service: Ophthalmology;  Laterality: Left;  [place] 00:38   . COLONOSCOPY  12/2007   Medhoff, diverticulosis and polyp, rpt 5 yrs  . dental implants    . FORAMINOTOMY 2 LEVEL Left 10/2013   C4/5, C5/6 ([place])  . HERNIA REPAIR     right 05/1999, left 09/04/05  . JOINT REPLACEMENT Right    TKR  . KNEE ARTHROPLASTY Left 05/20/2017   Procedure: COMPUTER ASSISTED TOTAL KNEE ARTHROPLASTY;  Surgeon: Dereck Leep, MD;  Location: [facility];  Service: Orthopedics;  Laterality: Left;  . KNEE ARTHROSCOPY  09/1997   left  . POSTERIOR CERVICAL FUSION/FORAMINOTOMY N/A 11/22/2013   Procedure: LEFT C4-5 AND C5-6 FORAMINOTOMY;  Surgeon: Jessy Oto, MD;  Location: [place];  Service: Orthopedics;  Laterality: N/A;  . PROSTATECTOMY  08/1990  . 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 TONSILLECTOMY      There were no vitals filed for this visit.  Subjective Assessment - 11/30/19 0759    Subjective  "Feeling pretty good"    Pertinent History  arthritis, TKA B >5 years prior, COPD    Currently in Pain?  Yes    Pain Score  1  Pain Location  Back                        [facility] Adult PT Treatment/Exercise - 11/30/19 0001      Lumbar Exercises: Aerobic   Nustep  L5 x 8 min      Lumbar Exercises: Machines for Strengthening   Cybex Knee Extension  15# 2x10    Cybex Knee Flexion  25# 3x10    Leg Press  40# 2x10 BLE    Other Lumbar Machine Exercise  heel raises 2x15 standing, shoulder extension 10# 2x10, AR press 15# 1x10 B    Other Lumbar Machine Exercise  rows and lats 35# 2x15      Traction   Min (lbs)  65    Max (lbs)  75    Hold Time  60    Rest Time  20    Time  12               PT Short Term Goals - 11/22/19 0806      PT SHORT TERM GOAL #1   Title  Pt will be independent with HEP    Time  2    Period   Weeks    Status  Achieved        PT Long Term Goals - 11/22/19 6283      PT LONG TERM GOAL #1   Title  Pt will demonstrate increase in bilat hamstring length to 90 deg or greater in order to decrease pain and increase functional ROM    Baseline  75    Status  On-going      PT LONG TERM GOAL #2   Title  Pt will increase lumbar extension ROM to <25% limited in order to decrease pain and improve functional abilities    Status  On-going      PT LONG TERM GOAL #3   Title  Pt will increase hip abduction and hip extension strength to 4/5 in order to facilitate return to pain free ADLs    Baseline  4-/5    Status  Partially Met      PT LONG TERM GOAL #4   Title  Pt will report ability to walk >30 min with no increase in LBP    Status  Achieved      PT LONG TERM GOAL #5   Title  Pt will report resolution of radiating pain down RLE    Baseline  only has radiating pain in the morning; goes away as day progresses    Status  Partially Met            Plan - 11/30/19 6629    Clinical Impression Statement  Progressing towards goals, increased resistance and or intensities tolerated well with LE strengthening interventions. Core weakness noted with anti-rotational pressed. Increases the amount of pull on traction without issue.    Personal Factors and Comorbidities  Age;Comorbidity 1    Examination-Activity Limitations  Bend;Carry;Lift;Sit;Squat;Stand    Examination-Participation Restrictions  Cleaning;Community Activity    Rehab Potential  Good    PT Frequency  2x / week    PT Duration  8 weeks    PT Treatment/Interventions  ADLs/Self Care Home Management;Electrical Stimulation;Iontophoresis '4mg'$ /ml Dexamethasone;Moist Heat;Gait training;Stair training;Functional mobility training;Therapeutic activities;Therapeutic exercise;Balance training;Neuromuscular re-education;Manual techniques;Patient/family education;Passive range of motion    PT Next Visit Plan  continue to work with him on  flexibbility and core strength,       Patient will  benefit from skilled therapeutic intervention in order to improve the following deficits and impairments:  Abnormal gait, Decreased range of motion, Difficulty walking, Decreased endurance, Decreased activity tolerance, Pain, Decreased balance, Hypomobility, Impaired flexibility, Decreased mobility  Visit Diagnosis: Muscle weakness (generalized)  Acute right-sided low back pain with right-sided sciatica     Problem List Patient Active Problem List   Diagnosis Date Noted  . Stiffness of multiple joints 10/10/2019  . Carotid stenosis, asymptomatic, bilateral 02/06/2019  . Vertigo 01/28/2019  . Abrasion of nose 04/23/2018  . Thoracic ascending aortic aneurysm ([place]) 10/26/2017  . S/P total knee arthroplasty 05/20/2017  . Osteoarthritis 09/22/2016  . Pulmonary nodules 09/25/2015  . CAD (coronary artery disease) 09/25/2015  . Atherosclerosis of aortic arch ([place]) 09/25/2015  . Verruca 08/17/2015  . Advanced care planning/counseling discussion 09/08/2014  . Health maintenance examination 09/08/2014  . 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 Spinal stenosis in cervical region 11/22/2013  . Medicare annual wellness visit, subsequent 06/22/2012  . Cervical stenosis of spinal canal 07/29/2011    Class: Chronic  . SHOULDER PAIN, RIGHT 04/18/2010  . History of tobacco abuse 12/02/2006  . COPD (chronic obstructive pulmonary disease) ([place]) 12/01/2006  . DIVERTICULOSIS, COLON 12/01/2006  . Chronic back pain 12/01/2006  . PROSTATE CANCER, HX OF 12/01/2006    Scot Jun, PTA 11/30/2019, 8:40 AM  [place] [address] [place], [place], [address] Phone: [phone]   Fax:  [phone]  Name: Carlos Olson MRN: [id number] Date of Birth: 1941-06-03

## 2019-12-01 ENCOUNTER — Encounter: Payer: Self-pay | Admitting: Physical Therapy

## 2019-12-01 ENCOUNTER — Ambulatory Visit: Payer: Medicare HMO | Admitting: Physical Therapy

## 2019-12-01 DIAGNOSIS — M5441 Lumbago with sciatica, right side: Secondary | ICD-10-CM | POA: Diagnosis not present

## 2019-12-01 DIAGNOSIS — M6281 Muscle weakness (generalized): Secondary | ICD-10-CM

## 2019-12-01 NOTE — Therapy (Signed)
[place] [address] [place], [place], [address] Phone: [phone]   Fax:  [phone]  Physical Therapy Treatment  Patient Details  Name: Carlos Olson MRN: [id number] Date of Birth: 1941/04/20 Referring Provider (PT): Ria Bush   Encounter Date: 12/01/2019  PT End of Session - 12/01/19 0837    Visit Number  13    Date for PT Re-Evaluation  12/19/19    PT Start Time  0756    PT Stop Time  0848    PT Time Calculation (min)  52 min    Activity Tolerance  Patient tolerated treatment well    Behavior During Therapy  [facility] for tasks assessed/performed       Past Medical History:  Diagnosis Date  . Atherosclerosis of aortic arch ([place]) 09/25/2015   By CT scan   . CAD (coronary artery disease) 09/25/2015   Mild 3v by CT scan   . Cancer [facility])    prostate  . Colon polyps   . COPD (chronic obstructive pulmonary disease) ([place])   . Diverticulosis of colon   . Femoral bruit    bilateral, found by Dr. Jefm Bryant  . Heart murmur    child  . History of prostate cancer   . IBS (irritable bowel syndrome)    improved after retirement  . 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 Pelvic fracture ([place])   . Pneumonia    child  . Pulmonary nodules 09/25/2015   On screening CT scan, rpt 1 yr   . Shortness of breath    doe    Past Surgical History:  Procedure Laterality Date  . ANTERIOR CERVICAL DECOMP/DISCECTOMY FUSION  07/29/2011   Procedure: ANTERIOR CERVICAL DECOMPRESSION/DISCECTOMY FUSION 1 LEVEL;  Surgeon: Jessy Oto, MD;  Location: [place];  Service: Orthopedics;  Laterality: N/A;  Anterior Cervical Discectomy and Fusion C3-4  . CATARACT EXTRACTION W/PHACO Right 07/10/2015   Procedure: CATARACT EXTRACTION PHACO AND INTRAOCULAR LENS PLACEMENT (IOC);  Surgeon: Birder Robson, MD;  Location: [facility];  Service: Ophthalmology;  Laterality: Right;  [place] 00:58   . CATARACT  EXTRACTION W/PHACO Left 07/24/2015   Procedure: CATARACT EXTRACTION PHACO AND INTRAOCULAR LENS PLACEMENT (IOC);  Surgeon: Birder Robson, MD;  Location: [facility];  Service: Ophthalmology;  Laterality: Left;  [place] 00:38   . COLONOSCOPY  12/2007   Medhoff, diverticulosis and polyp, rpt 5 yrs  . dental implants    . FORAMINOTOMY 2 LEVEL Left 10/2013   C4/5, C5/6 ([place])  . HERNIA REPAIR     right 05/1999, left 09/04/05  . JOINT REPLACEMENT Right    TKR  . KNEE ARTHROPLASTY Left 05/20/2017   Procedure: COMPUTER ASSISTED TOTAL KNEE ARTHROPLASTY;  Surgeon: Dereck Leep, MD;  Location: [facility];  Service: Orthopedics;  Laterality: Left;  . KNEE ARTHROSCOPY  09/1997   left  . POSTERIOR CERVICAL FUSION/FORAMINOTOMY N/A 11/22/2013   Procedure: LEFT C4-5 AND C5-6 FORAMINOTOMY;  Surgeon: Jessy Oto, MD;  Location: [place];  Service: Orthopedics;  Laterality: N/A;  . PROSTATECTOMY  08/1990  . 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 TONSILLECTOMY      There were no vitals filed for this visit.  Subjective Assessment - 12/01/19 0757    Subjective  "The best Ive ever felt" "A little achy this morning but the last two hours been great"    Currently in Pain?  No/denies   "I can feel it,  but I can't even call it pain"                       [facility] Adult PT Treatment/Exercise - 12/01/19 0001      Lumbar Exercises: Aerobic   Nustep  L5 x 8 min      Lumbar Exercises: Machines for Strengthening   Cybex Knee Extension  15# 2x10    Leg Press  40# 2x10 BLE    Other Lumbar Machine Exercise  heel raises 2x15 standing, shoulder extension 10# 2x10    Other Lumbar Machine Exercise  lats 35# 2x15      Lumbar Exercises: Supine   Bridge  Compliant;10 reps;2 seconds    Other Supine Lumbar Exercises  feet on ball bridges,  K2C, trunk rotation,       Knee/Hip Exercises: Standing   Walking with Sports Cord  30lb forward and side step  x 3 each      Knee/Hip Exercises: Seated   Other Seated  Knee/Hip Exercises  R      Traction   Type of Traction  Lumbar    Min (lbs)  65    Max (lbs)  75    Hold Time  60    Rest Time  20    Time  12               PT Short Term Goals - 11/22/19 0806      PT SHORT TERM GOAL #1   Title  Pt will be independent with HEP    Time  2    Period  Weeks    Status  Achieved        PT Long Term Goals - 12/01/19 2458      PT LONG TERM GOAL #1   Title  Pt will demonstrate increase in bilat hamstring length to 90 deg or greater in order to decrease pain and increase functional ROM    Status  On-going      PT LONG TERM GOAL #2   Title  Pt will increase lumbar extension ROM to <25% limited in order to decrease pain and improve functional abilities    Status  On-going      PT LONG TERM GOAL #3   Title  Pt will increase hip abduction and hip extension strength to 4/5 in order to facilitate return to pain free ADLs    Status  Partially Met      PT LONG TERM GOAL #5   Title  Pt will report resolution of radiating pain down RLE    Status  Partially Met            Plan - 12/01/19 0998    Clinical Impression Statement  Progressing well. Reports today has been the best he has felt. Added resisted  sport cord walking to promote better hip strength ands balance. Some core weakness noted with supine interventions.    Personal Factors and Comorbidities  Age;Comorbidity 1    Examination-Activity Limitations  Bend;Carry;Lift;Sit;Squat;Stand    Examination-Participation Restrictions  Cleaning;Community Activity    Stability/Clinical Decision Making  Stable/Uncomplicated    Rehab Potential  Good    PT Frequency  2x / week    PT Duration  8 weeks    PT Treatment/Interventions  ADLs/Self Care Home Management;Electrical Stimulation;Iontophoresis 63m/ml Dexamethasone;Moist Heat;Gait training;Stair training;Functional mobility training;Therapeutic activities;Therapeutic exercise;Balance training;Neuromuscular re-education;Manual  techniques;Patient/family education;Passive range of motion    PT Next Visit Plan  continue to work with  him on flexibbility and core strength,       Patient will benefit from skilled therapeutic intervention in order to improve the following deficits and impairments:  Abnormal gait, Decreased range of motion, Difficulty walking, Decreased endurance, Decreased activity tolerance, Pain, Decreased balance, Hypomobility, Impaired flexibility, Decreased mobility  Visit Diagnosis: Muscle weakness (generalized)  Acute right-sided low back pain with right-sided sciatica     Problem List Patient Active Problem List   Diagnosis Date Noted  . Stiffness of multiple joints 10/10/2019  . Carotid stenosis, asymptomatic, bilateral 02/06/2019  . Vertigo 01/28/2019  . Abrasion of nose 04/23/2018  . Thoracic ascending aortic aneurysm ([place]) 10/26/2017  . S/P total knee arthroplasty 05/20/2017  . Osteoarthritis 09/22/2016  . Pulmonary nodules 09/25/2015  . CAD (coronary artery disease) 09/25/2015  . Atherosclerosis of aortic arch ([place]) 09/25/2015  . Verruca 08/17/2015  . Advanced care planning/counseling discussion 09/08/2014  . Health maintenance examination 09/08/2014  . 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 Spinal stenosis in cervical region 11/22/2013  . Medicare annual wellness visit, subsequent 06/22/2012  . Cervical stenosis of spinal canal 07/29/2011    Class: Chronic  . SHOULDER PAIN, RIGHT 04/18/2010  . History of tobacco abuse 12/02/2006  . COPD (chronic obstructive pulmonary disease) ([place]) 12/01/2006  . DIVERTICULOSIS, COLON 12/01/2006  . Chronic back pain 12/01/2006  . PROSTATE CANCER, HX OF 12/01/2006    Scot Jun, PTA 12/01/2019, 8:43 AM  [place] [address] [place], [place], [address] Phone: [phone]   Fax:  [phone]  Name: Carlos Olson MRN:  [id number] Date of Birth: 05-27-1941

## 2019-12-13 ENCOUNTER — Other Ambulatory Visit: Payer: Self-pay

## 2019-12-13 ENCOUNTER — Ambulatory Visit: Payer: Medicare HMO | Admitting: Physical Therapy

## 2019-12-13 ENCOUNTER — Encounter: Payer: Self-pay | Admitting: Physical Therapy

## 2019-12-13 DIAGNOSIS — M5441 Lumbago with sciatica, right side: Secondary | ICD-10-CM

## 2019-12-13 DIAGNOSIS — M6281 Muscle weakness (generalized): Secondary | ICD-10-CM

## 2019-12-13 NOTE — Therapy (Signed)
[place] [address] [place], [place], [address] Phone: [phone]   Fax:  [phone]  Physical Therapy Treatment  Patient Details  Name: Carlos Olson MRN: [id number] Date of Birth: [birth year] Referring Provider (PT): Ria Bush   Encounter Date: 12/13/2019   PT End of Session - 12/13/19 0844    Visit Number 14    Date for PT Re-Evaluation 12/19/19    PT Start Time 0759    PT Stop Time 0855    PT Time Calculation (min) 56 min    Activity Tolerance Patient tolerated treatment well    Behavior During Therapy [facility] for tasks assessed/performed           Past Medical History:  Diagnosis Date  . Atherosclerosis of aortic arch ([place]) 09/25/2015   By CT scan   . CAD (coronary artery disease) 09/25/2015   Mild 3v by CT scan   . Cancer [facility])    prostate  . Colon polyps   . COPD (chronic obstructive pulmonary disease) ([place])   . Diverticulosis of colon   . Femoral bruit    bilateral, found by Dr. Jefm Bryant  . Heart murmur    child  . History of prostate cancer   . IBS (irritable bowel syndrome)    improved after retirement  . 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 Pelvic fracture ([place])   . Pneumonia    child  . Pulmonary nodules 09/25/2015   On screening CT scan, rpt 1 yr   . Shortness of breath    doe    Past Surgical History:  Procedure Laterality Date  . ANTERIOR CERVICAL DECOMP/DISCECTOMY FUSION  07/29/2011   Procedure: ANTERIOR CERVICAL DECOMPRESSION/DISCECTOMY FUSION 1 LEVEL;  Surgeon: Jessy Oto, MD;  Location: [place];  Service: Orthopedics;  Laterality: N/A;  Anterior Cervical Discectomy and Fusion C3-4  . CATARACT EXTRACTION W/PHACO Right 07/10/2015   Procedure: CATARACT EXTRACTION PHACO AND INTRAOCULAR LENS PLACEMENT (IOC);  Surgeon: Birder Robson, MD;  Location: [facility];  Service: Ophthalmology;  Laterality: Right;  [place] 00:58   . CATARACT  EXTRACTION W/PHACO Left 07/24/2015   Procedure: CATARACT EXTRACTION PHACO AND INTRAOCULAR LENS PLACEMENT (IOC);  Surgeon: Birder Robson, MD;  Location: [facility];  Service: Ophthalmology;  Laterality: Left;  [place] 00:38   . COLONOSCOPY  12/2007   Medhoff, diverticulosis and polyp, rpt 5 yrs  . dental implants    . FORAMINOTOMY 2 LEVEL Left 10/2013   C4/5, C5/6 ([place])  . HERNIA REPAIR     right 05/1999, left 09/04/05  . JOINT REPLACEMENT Right    TKR  . KNEE ARTHROPLASTY Left 05/20/2017   Procedure: COMPUTER ASSISTED TOTAL KNEE ARTHROPLASTY;  Surgeon: Dereck Leep, MD;  Location: [facility];  Service: Orthopedics;  Laterality: Left;  . KNEE ARTHROSCOPY  09/1997   left  . POSTERIOR CERVICAL FUSION/FORAMINOTOMY N/A 11/22/2013   Procedure: LEFT C4-5 AND C5-6 FORAMINOTOMY;  Surgeon: Jessy Oto, MD;  Location: [place];  Service: Orthopedics;  Laterality: N/A;  . PROSTATECTOMY  08/1990  . 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 TONSILLECTOMY      There were no vitals filed for this visit.   Subjective Assessment - 12/13/19 0801    Subjective Pt reports he feels pretty good overall. Wants one more session because he is a little sore/tired from long trip last week.    Currently in Pain? Yes    Pain  Score 1     Pain Location Back                             [facility] Adult PT Treatment/Exercise - 12/13/19 0001      Lumbar Exercises: Aerobic   Nustep L5 x 8 min      Lumbar Exercises: Machines for Strengthening   Leg Press 40# 2x10 BLE    Other Lumbar Machine Exercise heel raises 2x15 standing, shoulder extension 10# 2x10, resisted gait 40# x5 each direction    Other Lumbar Machine Exercise rows and lats 35# 2x15      Traction   Type of Traction Lumbar    Min (lbs) 65    Max (lbs) 75    Hold Time 60    Rest Time 20    Time 12                    PT Short Term Goals - 11/22/19 0806      PT SHORT TERM GOAL #1   Title Pt will be independent with HEP    Time 2     Period Weeks    Status Achieved             PT Long Term Goals - 12/13/19 0845      PT LONG TERM GOAL #1   Title Pt will demonstrate increase in bilat hamstring length to 90 deg or greater in order to decrease pain and increase functional ROM    Status On-going      PT LONG TERM GOAL #2   Title Pt will increase lumbar extension ROM to <25% limited in order to decrease pain and improve functional abilities    Status Achieved      PT LONG TERM GOAL #3   Title Pt will increase hip abduction and hip extension strength to 4/5 in order to facilitate return to pain free ADLs    Status Achieved      PT LONG TERM GOAL #4   Title Pt will report ability to walk >30 min with no increase in LBP    Status Achieved      PT LONG TERM GOAL #5   Title Pt will report resolution of radiating pain down RLE    Status Achieved                 Plan - 12/13/19 0846    Clinical Impression Statement Pt has met lumbar ROM goals, reports resolution of radiating pain, and reports he is happy with progress. Pt would like to schedule one more rx to address lumbar soreness following trip to [place] last week. Plan on discharging at next rx with updated HEP.    PT Treatment/Interventions ADLs/Self Care Home Management;Electrical Stimulation;Iontophoresis 63m/ml Dexamethasone;Moist Heat;Gait training;Stair training;Functional mobility training;Therapeutic activities;Therapeutic exercise;Balance training;Neuromuscular re-education;Manual techniques;Patient/family education;Passive range of motion    PT Next Visit Plan continue to work with him on flexibbility and core strength,    Consulted and Agree with Plan of Care Patient           Patient will benefit from skilled therapeutic intervention in order to improve the following deficits and impairments:  Abnormal gait, Decreased range of motion, Difficulty walking, Decreased endurance, Decreased activity tolerance, Pain, Decreased balance,  Hypomobility, Impaired flexibility, Decreased mobility  Visit Diagnosis: Muscle weakness (generalized)  Acute right-sided low back pain with right-sided sciatica     Problem List Patient Active Problem  List   Diagnosis Date Noted  . Stiffness of multiple joints 10/10/2019  . Carotid stenosis, asymptomatic, bilateral 02/06/2019  . Vertigo 01/28/2019  . Abrasion of nose 04/23/2018  . Thoracic ascending aortic aneurysm ([place]) 10/26/2017  . S/P total knee arthroplasty 05/20/2017  . Osteoarthritis 09/22/2016  . Pulmonary nodules 09/25/2015  . CAD (coronary artery disease) 09/25/2015  . Atherosclerosis of aortic arch ([place]) 09/25/2015  . Verruca 08/17/2015  . Advanced care planning/counseling discussion 09/08/2014  . Health maintenance examination 09/08/2014  . 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 Spinal stenosis in cervical region 11/22/2013  . Medicare annual wellness visit, subsequent 06/22/2012  . Cervical stenosis of spinal canal 07/29/2011    Class: Chronic  . SHOULDER PAIN, RIGHT 04/18/2010  . History of tobacco abuse 12/02/2006  . COPD (chronic obstructive pulmonary disease) ([place]) 12/01/2006  . DIVERTICULOSIS, COLON 12/01/2006  . Chronic back pain 12/01/2006  . PROSTATE CANCER, HX OF 12/01/2006   Amador Cunas, PT, DPT Donald Prose [place]al Cafaro 12/13/2019, 9:05 AM  [place] [address] [place], [place], [address] Phone: [phone]   Fax:  [phone]  Name: Carlos Olson MRN: [id number] Date of Birth: [birth year]

## 2019-12-20 ENCOUNTER — Ambulatory Visit: Payer: Medicare HMO | Admitting: Physical Therapy

## 2019-12-20 ENCOUNTER — Other Ambulatory Visit: Payer: Self-pay

## 2019-12-20 ENCOUNTER — Encounter: Payer: Self-pay | Admitting: Physical Therapy

## 2019-12-20 DIAGNOSIS — M5441 Lumbago with sciatica, right side: Secondary | ICD-10-CM

## 2019-12-20 DIAGNOSIS — M6281 Muscle weakness (generalized): Secondary | ICD-10-CM

## 2019-12-20 NOTE — Therapy (Signed)
[place], [place], [address] Phone: [phone]   Fax:  [phone]  Physical Therapy Treatment PHYSICAL THERAPY DISCHARGE SUMMARY  Plan: Patient agrees to discharge.  Patient goals were partially met. Patient is being discharged due to being pleased with the current functional level.  ?????     Patient Details  Name: Carlos Olson MRN: [id number] Date of Birth: 1940/09/05 Referring Provider (PT): Ria Bush   Encounter Date: 12/20/2019   PT End of Session - 12/20/19 0833    Visit Number 15    Date for PT Re-Evaluation 12/19/19    PT Start Time 0800    PT Stop Time 0846    PT Time Calculation (min) 46 min    Activity Tolerance Patient tolerated treatment well    Behavior During Therapy [facility] for tasks assessed/performed           Past Medical History:  Diagnosis Date  . Atherosclerosis of aortic arch ([place]) 09/25/2015   By CT scan   . CAD (coronary artery disease) 09/25/2015   Mild 3v by CT scan   . Cancer [facility])    prostate  . Colon polyps   . COPD (chronic obstructive pulmonary disease) ([place])   . Diverticulosis of colon   . Femoral bruit    bilateral, found by Dr. Jefm Bryant  . Heart murmur    child  . History of prostate cancer   . IBS (irritable bowel syndrome)    improved after retirement  . 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 Pelvic fracture ([place])   . Pneumonia    child  . Pulmonary nodules 09/25/2015   On screening CT scan, rpt 1 yr   . Shortness of breath    doe    Past Surgical History:  Procedure Laterality Date  . ANTERIOR CERVICAL DECOMP/DISCECTOMY FUSION  07/29/2011   Procedure: ANTERIOR CERVICAL DECOMPRESSION/DISCECTOMY FUSION 1 LEVEL;  Surgeon: Jessy Oto, MD;  Location: [facility];  Service: Orthopedics;  Laterality: N/A;  Anterior Cervical Discectomy and Fusion C3-4  . CATARACT EXTRACTION W/PHACO Right  07/10/2015   Procedure: CATARACT EXTRACTION PHACO AND INTRAOCULAR LENS PLACEMENT (IOC);  Surgeon: Birder Robson, MD;  Location: [facility];  Service: Ophthalmology;  Laterality: Right;  [place] 00:58   . CATARACT EXTRACTION W/PHACO Left 07/24/2015   Procedure: CATARACT EXTRACTION PHACO AND INTRAOCULAR LENS PLACEMENT (IOC);  Surgeon: Birder Robson, MD;  Location: [facility];  Service: Ophthalmology;  Laterality: Left;  [place] 00:38   . COLONOSCOPY  12/2007   [place], diverticulosis and polyp, rpt 5 yrs  . dental implants    . FORAMINOTOMY 2 LEVEL Left 10/2013   C4/5, C5/6 ([place])  . HERNIA REPAIR     right 05/1999, left 09/04/05  . JOINT REPLACEMENT Right    TKR  . KNEE ARTHROPLASTY Left 05/20/2017   Procedure: COMPUTER ASSISTED TOTAL KNEE ARTHROPLASTY;  Surgeon: Dereck Leep, MD;  Location: [facility];  Service: Orthopedics;  Laterality: Left;  . KNEE ARTHROSCOPY  09/1997   left  . POSTERIOR CERVICAL FUSION/FORAMINOTOMY N/A 11/22/2013   Procedure: LEFT C4-5 AND C5-6 FORAMINOTOMY;  Surgeon: Jessy Oto, MD;  Location: [facility];  Service: Orthopedics;  Laterality: N/A;  . PROSTATECTOMY  08/1990  . 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 TONSILLECTOMY      There were no vitals filed for this visit.   Subjective Assessment - 12/20/19 5631  Subjective Feeling pretty good,back is a little sore, the sciatica is totally gone. back feels better than it did a month ago    Currently in Pain? Yes    Pain Score 1     Pain Location Back    Pain Orientation Lower                             [facility] Adult PT Treatment/Exercise - 12/20/19 0001      Lumbar Exercises: Aerobic   Nustep L5 x 8 min      Lumbar Exercises: Machines for Strengthening   Cybex Knee Extension 15# 2x10    Cybex Knee Flexion 25# 2x10    Leg Press 40# 2x10 BLE    Other Lumbar Machine Exercise heel raises 2x15     Other Lumbar Machine Exercise rows and lats 35# 2x15      Lumbar Exercises: Standing   Shoulder  Extension Strengthening;Theraband;20 reps;Both    Theraband Level (Shoulder Extension) Level 3 (Green)    Other Standing Lumbar Exercises 20# straight arm pulls with cues for posture and core activation      Traction   Type of Traction Lumbar    Min (lbs) 65    Max (lbs) 75    Hold Time 60    Rest Time 20    Time 12                    PT Short Term Goals - 11/22/19 0806      PT SHORT TERM GOAL #1   Title Pt will be independent with HEP    Time 2    Period Weeks    Status Achieved             PT Long Term Goals - 12/20/19 0801      PT LONG TERM GOAL #1   Title Pt will demonstrate increase in bilat hamstring length to 90 deg or greater in order to decrease pain and increase functional ROM    Status On-going      PT LONG TERM GOAL #2   Title Pt will increase lumbar extension ROM to <25% limited in order to decrease pain and improve functional abilities    Status Achieved      PT LONG TERM GOAL #3   Title Pt will increase hip abduction and hip extension strength to 4/5 in order to facilitate return to pain free ADLs    Status Achieved      PT LONG TERM GOAL #4   Title Pt will report ability to walk >30 min with no increase in LBP    Status Achieved      PT LONG TERM GOAL #5   Title Pt will report resolution of radiating pain down RLE    Status Achieved                 Plan - 12/20/19 0834    Clinical Impression Statement All goals met but one, pt still has some bilateral HS tightness. Pt did well with all activities today. Cues to prevent posterior leaning with seated rows. He reports no functional limitations at home and is pleased with his current functional status.    Personal Factors and Comorbidities Age;Comorbidity 1    Examination-Activity Limitations Bend;Carry;Lift;Sit;Squat;Stand    Examination-Participation Restrictions Cleaning;Community Activity    Stability/Clinical Decision Making Stable/Uncomplicated    Rehab Potential Good    PT  Frequency 2x /  week    PT Duration 8 weeks    PT Treatment/Interventions ADLs/Self Care Home Management;Electrical Stimulation;Iontophoresis 37m/ml Dexamethasone;Moist Heat;Gait training;Stair training;Functional mobility training;Therapeutic activities;Therapeutic exercise;Balance training;Neuromuscular re-education;Manual techniques;Patient/family education;Passive range of motion    PT Next Visit Plan D/C PT           Patient will benefit from skilled therapeutic intervention in order to improve the following deficits and impairments:  Abnormal gait, Decreased range of motion, Difficulty walking, Decreased endurance, Decreased activity tolerance, Pain, Decreased balance, Hypomobility, Impaired flexibility, Decreased mobility  Visit Diagnosis: Muscle weakness (generalized)  Acute right-sided low back pain with right-sided sciatica     Problem List Patient Active Problem List   Diagnosis Date Noted  . Stiffness of multiple joints 10/10/2019  . Carotid stenosis, asymptomatic, bilateral 02/06/2019  . Vertigo 01/28/2019  . Abrasion of nose 04/23/2018  . Thoracic [place] 10/26/2017  . S/P total knee arthroplasty 05/20/2017  . Osteoarthritis 09/22/2016  . Pulmonary nodules 09/25/2015  . CAD (coronary artery disease) 09/25/2015  . [place] 09/25/2015  . [place] 08/17/2015  . Advanced care planning/counseling discussion 09/08/2014  . Health maintenance examination 09/08/2014  . 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 Spinal stenosis in cervical region 11/22/2013  . Medicare annual wellness visit, subsequent 06/22/2012  . Cervical stenosis of spinal canal 07/29/2011    Class: Chronic  . SHOULDER PAIN, RIGHT 04/18/2010  . History of tobacco abuse 12/02/2006  . COPD (chronic obstructive pulmonary disease) ([place] 12/01/2006  . DIVERTICULOSIS, COLON 12/01/2006  . Chronic back pain 12/01/2006  .  PROSTATE CANCER, HX OF 12/01/2006   PHYSICAL THERAPY DISCHARGE SUMMARY  Visits from Start of Care: 15 Plan: Patient agrees to discharge.  Patient goals were partially met. Patient is being discharged due to being pleased with the current functional level.  ?????     AAmador Cunas PT, DPT RScot Jun PTA 12/20/2019, 8:36 AM  [place] [address]Phone: [phone]  Fax:  [phone] Name: Carlos SCHAUMBURGMRN: [id number] of Birth: 104-16-1942

## 2020-02-06 ENCOUNTER — Telehealth: Payer: Self-pay

## 2020-02-06 DIAGNOSIS — G8929 Other chronic pain: Secondary | ICD-10-CM

## 2020-02-06 DIAGNOSIS — M899 Disorder of bone, unspecified: Secondary | ICD-10-CM

## 2020-02-06 NOTE — Telephone Encounter (Addendum)
Would suggest rpt MRI to better evaluate how back is. If he agrees I will order, but insurance may need f/u OV prior to approving - would try without first. Let me know if he's amenable to this.   Would also recommend checking DEXA scan to evaluate bone strength as prior xray showed signs of osteoporosis/osteopenia - and if abnormal, f/u in office to discuss further treatment of this.

## 2020-02-06 NOTE — Telephone Encounter (Signed)
Patient contacted the office and states that he had PT back in May/June to help with his back pain. He states that thi did help with his pain temporarily, but he states the back pain has come back and seems to be worse. He is wondering if Dr. Darnell Level would recommend more PT, or if there is anything else he needs to do?

## 2020-02-06 NOTE — Addendum Note (Signed)
Addended by: Ria Bush on: 02/06/2020 05:03 PM   Modules accepted: Orders

## 2020-02-07 ENCOUNTER — Telehealth: Payer: Self-pay

## 2020-02-07 NOTE — Telephone Encounter (Signed)
MRI ordered

## 2020-02-07 NOTE — Telephone Encounter (Signed)
Spoke with pt relaying Dr. Synthia Innocent message.  Pt verbalizes understanding and agrees to MRI.  Prefers [place].

## 2020-02-07 NOTE — Addendum Note (Signed)
Addended by: Ria Bush on: 02/07/2020 11:51 AM   Modules accepted: Orders

## 2020-02-07 NOTE — Telephone Encounter (Signed)
Plz schedule wellness, cpe and lab visits.  

## 2020-02-08 NOTE — Telephone Encounter (Signed)
Pt scheduled for lab and AWV on 10/11, CPE scheduled 10/18. If he needs sooner appointment please call patient back because as far as CPE without getting errors this was the first I seen.

## 2020-02-08 NOTE — Telephone Encounter (Signed)
Noted  

## 2020-02-08 NOTE — Telephone Encounter (Signed)
Called patient and left voicemail for patient to call office to get scheduled for CPE labs and [place].

## 2020-02-14 ENCOUNTER — Telehealth: Payer: Self-pay | Admitting: Family Medicine

## 2020-02-14 NOTE — Telephone Encounter (Signed)
Patient called. Patient was seen by Dr.G and an order was put in Epic for a Bone Density. Patient prefers [facility] and patient can go anytime.  Patient will be out of town 02/19/20-02/21/20.

## 2020-02-16 DIAGNOSIS — R69 Illness, unspecified: Secondary | ICD-10-CM | POA: Diagnosis not present

## 2020-02-24 ENCOUNTER — Encounter: Payer: Self-pay | Admitting: *Deleted

## 2020-02-27 ENCOUNTER — Telehealth: Payer: Self-pay | Admitting: Family Medicine

## 2020-02-27 NOTE — Telephone Encounter (Signed)
Spoke with patient regarding his insurance approval for the MRI. Pt also given the number for [facility] to schedule his BD Scan.   Nothing further needed.

## 2020-02-27 NOTE — Telephone Encounter (Signed)
Patient called in stating he has heard from insurance and would like to speak with [place] in regards to this. Please advise.

## 2020-02-28 ENCOUNTER — Other Ambulatory Visit: Payer: Self-pay

## 2020-02-28 ENCOUNTER — Ambulatory Visit (INDEPENDENT_AMBULATORY_CARE_PROVIDER_SITE_OTHER)
Admission: RE | Admit: 2020-02-28 | Discharge: 2020-02-28 | Disposition: A | Discharge status: Home / Self Care | Payer: Medicare HMO | Source: Ambulatory Visit | Attending: Family Medicine | Admitting: Family Medicine

## 2020-02-28 DIAGNOSIS — M899 Disorder of bone, unspecified: Secondary | ICD-10-CM

## 2020-02-28 DIAGNOSIS — M949 Disorder of cartilage, unspecified: Secondary | ICD-10-CM | POA: Diagnosis not present

## 2020-03-01 ENCOUNTER — Other Ambulatory Visit: Payer: Self-pay

## 2020-03-01 ENCOUNTER — Ambulatory Visit
Admission: RE | Admit: 2020-03-01 | Discharge: 2020-03-01 | Disposition: A | Discharge status: Home / Self Care | Payer: Medicare HMO | Source: Ambulatory Visit | Attending: Family Medicine | Admitting: Family Medicine

## 2020-03-01 DIAGNOSIS — M48061 Spinal stenosis, lumbar region without neurogenic claudication: Secondary | ICD-10-CM | POA: Diagnosis not present

## 2020-03-01 DIAGNOSIS — G8929 Other chronic pain: Secondary | ICD-10-CM

## 2020-03-02 ENCOUNTER — Encounter: Payer: Self-pay | Admitting: Family Medicine

## 2020-03-02 ENCOUNTER — Other Ambulatory Visit: Payer: Self-pay | Admitting: Family Medicine

## 2020-03-02 ENCOUNTER — Telehealth: Payer: Self-pay | Admitting: Family Medicine

## 2020-03-02 ENCOUNTER — Telehealth: Payer: Self-pay

## 2020-03-02 ENCOUNTER — Other Ambulatory Visit: Payer: Self-pay

## 2020-03-02 DIAGNOSIS — I713 Abdominal aortic aneurysm, ruptured, unspecified: Secondary | ICD-10-CM

## 2020-03-02 DIAGNOSIS — I714 Abdominal aortic aneurysm, without rupture, unspecified: Secondary | ICD-10-CM

## 2020-03-02 DIAGNOSIS — G8929 Other chronic pain: Secondary | ICD-10-CM

## 2020-03-02 NOTE — Telephone Encounter (Signed)
Noted.  I think Dr. Cyndia Bent is with [facility].

## 2020-03-02 NOTE — Telephone Encounter (Signed)
I called to try and schedule him and they are reviewing his information and will call to schedule him.  Is Dr Cyndia Bent with [facility] in [place]? That's where his urgent referral went.

## 2020-03-02 NOTE — Telephone Encounter (Signed)
Left message with pt's wife, Luellen Pucker (not on dpr), asking pt to call back.  Need to relay MRI results and Dr. Synthia Innocent message.   Per Dr. Darnell Level: MRI incidentally found large abdominal aortic aneurysm that we need to get you to vascular surgery for further evaluation.  It is at a size you will need intervention for.  If any severe lower back or lower abdominal pain to go to ER.   Lumbar MRI also showing wear and tear arthritis to lower back with some scoliosis and disc herniation possibly pinching on L L2 nerve root.  I recommend a referral to back doctor for this (non-urgently) as you may benefit from repeat steroid injection into spine. Both referrals have been placed.

## 2020-03-02 NOTE — Telephone Encounter (Signed)
Pt returning call.  Relayed results and Dr. Synthia Innocent message.  Pt verbalizes understanding and requests to see Dr. Gilford Raid for aneurysm.  FYI to [employer].

## 2020-03-02 NOTE — Telephone Encounter (Signed)
See results note. 

## 2020-03-02 NOTE — Telephone Encounter (Signed)
Diane with [facility] called report on MRI of LS that was done on 03/01/20 so pt is not waiting. Sending report to DR G and taking printed copy to Dr Darnell Level also.

## 2020-03-02 NOTE — Telephone Encounter (Signed)
Pt called with ortho dr he saw before  Dr fredrick [facility]ina [facility]  Pt would like to be referred to this provider  [phone]

## 2020-03-02 NOTE — Telephone Encounter (Signed)
Referral was sent to [facility]

## 2020-03-06 ENCOUNTER — Other Ambulatory Visit: Payer: Self-pay

## 2020-03-06 DIAGNOSIS — I713 Abdominal aortic aneurysm, ruptured, unspecified: Secondary | ICD-10-CM

## 2020-03-06 NOTE — Telephone Encounter (Signed)
Unable to refer to Dr Cyndia Bent was this is a [facility], they do not deal with abdominal aneurysms. Referral sent to [facility] [place]  I attempted to call their office to schedule them and was never able to get through. I spent almost 20-30 minutes on hold and after calling 5 separate times and every call was disconnected after a long wait. I placed the referral on their WQ as an URGENT referral. I have also printed and faxed the referral with a request to call to schedule the patient ASAP.

## 2020-03-06 NOTE — Telephone Encounter (Signed)
After review of the chart - the patient is scheduled for an appt on 03/16/20 with Dr Dione Plover at [facility]. Current referral closed

## 2020-03-06 NOTE — Telephone Encounter (Signed)
Noted! Thank you

## 2020-03-08 ENCOUNTER — Other Ambulatory Visit: Payer: Self-pay | Admitting: Family Medicine

## 2020-03-08 ENCOUNTER — Encounter: Payer: Self-pay | Admitting: Family Medicine

## 2020-03-08 DIAGNOSIS — M8580 Other specified disorders of bone density and structure, unspecified site: Secondary | ICD-10-CM | POA: Insufficient documentation

## 2020-03-08 MED ORDER — VITAMIN D3 25 MCG (1000 UT) PO CAPS: 1.0000 | ORAL_CAPSULE | Freq: Every day | ORAL | Status: DC

## 2020-03-09 ENCOUNTER — Other Ambulatory Visit: Payer: Medicare HMO

## 2020-03-14 ENCOUNTER — Telehealth: Payer: Self-pay | Admitting: Family Medicine

## 2020-03-14 NOTE — Telephone Encounter (Signed)
LVM for pt to rtn my call to r/s appt with [facility]. 

## 2020-03-15 ENCOUNTER — Other Ambulatory Visit: Payer: Self-pay

## 2020-03-15 ENCOUNTER — Encounter (HOSPITAL_COMMUNITY): Payer: Self-pay

## 2020-03-15 ENCOUNTER — Ambulatory Visit (HOSPITAL_COMMUNITY)
Admission: RE | Admit: 2020-03-15 | Discharge: 2020-03-15 | Disposition: A | Discharge status: Home / Self Care | Payer: Medicare HMO | Source: Ambulatory Visit | Attending: Vascular Surgery | Admitting: Vascular Surgery

## 2020-03-15 DIAGNOSIS — I714 Abdominal aortic aneurysm, without rupture, unspecified: Secondary | ICD-10-CM

## 2020-03-15 DIAGNOSIS — I713 Abdominal aortic aneurysm, ruptured, unspecified: Secondary | ICD-10-CM

## 2020-03-15 DIAGNOSIS — I712 Thoracic aortic aneurysm, without rupture: Secondary | ICD-10-CM | POA: Diagnosis not present

## 2020-03-15 LAB — POCT I-STAT CREATININE: Creatinine, Ser: 1.1 mg/dL (ref 0.61–1.24)

## 2020-03-15 MED ORDER — IOHEXOL 350 MG/ML SOLN
100.0000 mL | Freq: Once | INTRAVENOUS | Status: AC | PRN
  Administered 2020-03-15: 100 mL via INTRAVENOUS

## 2020-03-16 ENCOUNTER — Other Ambulatory Visit: Payer: Self-pay

## 2020-03-16 ENCOUNTER — Encounter: Payer: Self-pay | Admitting: *Deleted

## 2020-03-16 ENCOUNTER — Other Ambulatory Visit: Payer: Self-pay | Admitting: *Deleted

## 2020-03-16 ENCOUNTER — Encounter: Payer: Self-pay | Admitting: Vascular Surgery

## 2020-03-16 ENCOUNTER — Ambulatory Visit (INDEPENDENT_AMBULATORY_CARE_PROVIDER_SITE_OTHER): Payer: Medicare HMO | Admitting: Vascular Surgery

## 2020-03-16 VITALS — BP 159/85 | HR 74 | Temp 98.0°F | Resp 20 | Ht 75.0 in | Wt 214.0 lb

## 2020-03-16 DIAGNOSIS — I2510 Atherosclerotic heart disease of native coronary artery without angina pectoris: Secondary | ICD-10-CM

## 2020-03-16 DIAGNOSIS — I714 Abdominal aortic aneurysm, without rupture, unspecified: Secondary | ICD-10-CM

## 2020-03-16 NOTE — H&P (View-Only) (Signed)
Patient ID: Carlos Olson, male   DOB: 10/02/1940, 79 y.o.   MRN: [id number]  Reason for Consult: New Patient (Initial Visit)   Referred by Ria Bush, MD  Subjective:     HPI:  Carlos Olson is a 79 y.o. male with known history of thoracic aneurysm and long history of back pain was evaluated MRI was found to have abdominal aortic aneurysm.  Yesterday underwent CT scan.  He has chronic back pain was somewhat alleviated in the past with physical therapy this is unchanged and not acute.  He is never known of any abdominal aortic or lower extremity aneurysms.  He is quite active.  He is a former smoker after 55 years but did quit several years ago.  He does not take any blood thinners does take aspirin daily.  He has never had a cardiologist.  Denies any history of stroke or heart attack.  Did have a brother with significant aneurysmal disease in his abdomen ultimately died from a stroke.  He also has a son and daughter unsure if they have aneurysm disease or not.  Past Medical History:  Diagnosis Date  . AAA (abdominal aortic aneurysm) ([place])   . Atherosclerosis of aortic arch ([place]) 09/25/2015   By CT scan   . CAD (coronary artery disease) 09/25/2015   Mild 3v by CT scan   . Cancer [facility])    prostate  . Colon polyps   . COPD (chronic obstructive pulmonary disease) ([place])   . Diverticulosis of colon   . Femoral bruit    bilateral, found by Dr. Jefm Bryant  . Heart murmur    child  . History of prostate cancer   . IBS (irritable bowel syndrome)    improved after retirement  . 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 Pelvic fracture ([place])   . Pneumonia    child  . Pulmonary nodules 09/25/2015   On screening CT scan, rpt 1 yr   . Shortness of breath    doe   Family History  Problem Relation Age of Onset  . Hypertension Mother   . Cancer Father        bone, prostate  . Diabetes Sister        x 40 years  . Cancer Brother         prostate   Past Surgical History:  Procedure Laterality Date  . ANTERIOR CERVICAL DECOMP/DISCECTOMY FUSION  07/29/2011   Procedure: ANTERIOR CERVICAL DECOMPRESSION/DISCECTOMY FUSION 1 LEVEL;  Surgeon: Jessy Oto, MD;  Location: [place];  Service: Orthopedics;  Laterality: N/A;  Anterior Cervical Discectomy and Fusion C3-4  . CATARACT EXTRACTION W/PHACO Right 07/10/2015   Procedure: CATARACT EXTRACTION PHACO AND INTRAOCULAR LENS PLACEMENT (IOC);  Surgeon: Birder Robson, MD;  Location: [facility];  Service: Ophthalmology;  Laterality: Right;  [place] 00:58   . CATARACT EXTRACTION W/PHACO Left 07/24/2015   Procedure: CATARACT EXTRACTION PHACO AND INTRAOCULAR LENS PLACEMENT (IOC);  Surgeon: Birder Robson, MD;  Location: [facility];  Service: Ophthalmology;  Laterality: Left;  [place] 00:38   . COLONOSCOPY  12/2007   Medhoff, diverticulosis and polyp, rpt 5 yrs  . dental implants    . FORAMINOTOMY 2 LEVEL Left 10/2013   C4/5, C5/6 (Nitka)  . HERNIA REPAIR     right 05/1999, left 09/04/05  . JOINT REPLACEMENT Right    TKR  . KNEE ARTHROPLASTY Left 05/20/2017   Procedure: COMPUTER ASSISTED TOTAL KNEE ARTHROPLASTY;  Surgeon: Marry Guan,  Laurice Record, MD;  Location: [facility];  Service: Orthopedics;  Laterality: Left;  . KNEE ARTHROSCOPY  09/1997   left  . POSTERIOR CERVICAL FUSION/FORAMINOTOMY N/A 11/22/2013   Procedure: LEFT C4-5 AND C5-6 FORAMINOTOMY;  Surgeon: Jessy Oto, MD;  Location: [place];  Service: Orthopedics;  Laterality: N/A;  . PROSTATECTOMY  08/1990  . 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 TONSILLECTOMY      Short Social History:  Social History   Tobacco Use  . Smoking status: Former Smoker    Packs/day: 2.00    Years: 52.00    Pack years: 104.00    Types: Cigarettes    Quit date: 05/15/2007    Years since quitting: 12.8  . Smokeless tobacco: Never Used  . Tobacco comment: quit 06/16/2007/encouraged to remain smoke free  Substance Use Topics  . Alcohol use: Yes    Alcohol/week: 14.0  standard drinks    Types: 14 Standard drinks or equivalent per week    Comment: 2 drinks a day    Allergies  Allergen Reactions  . Sulfonamide Derivatives Itching  . Sulfasalazine Itching    Current Outpatient Medications  Medication Sig Dispense Refill  . acetaminophen (TYLENOL) 500 MG tablet Take 1,000 mg by mouth 3 (three) times daily as needed for moderate pain or headache.     . albuterol (VENTOLIN HFA) 108 (90 Base) MCG/ACT inhaler Inhale 2 puffs into the lungs every 6 (six) hours as needed for wheezing or shortness of breath. 1 Inhaler 3  . aspirin EC 81 MG tablet Take 1 tablet (81 mg total) by mouth daily.    . Cholecalciferol (VITAMIN D3) 25 MCG (1000 UT) CAPS Take 1 capsule (1,000 Units total) by mouth daily. 30 capsule   . Fluticasone-Umeclidin-Vilant (TRELEGY ELLIPTA) 100-62.5-25 MCG/INH AEPB Inhale 1 puff into the lungs daily. 60 each 5   No current facility-administered medications for this visit.    Review of Systems  Constitutional:  Constitutional negative. HENT: HENT negative.  Eyes: Eyes negative.  Respiratory: Respiratory negative.  Cardiovascular: Cardiovascular negative.  GI: Gastrointestinal negative.  Musculoskeletal: Positive for back pain.  Skin: Skin negative.  Neurological: Neurological negative. Hematologic: Hematologic/lymphatic negative.  Psychiatric: Psychiatric negative.        Objective:  Objective   Vitals:   03/16/20 1035  BP: (!) 159/85  Pulse: 74  Resp: 20  Temp: 98 F (36.7 C)  SpO2: 95%  Weight: 214 lb (97.1 kg)  Height: 6\' 3"  (1.905 m)   Body mass index is 26.75 kg/m.  Physical Exam HENT:     Head: Normocephalic.     Nose:     Comments: Wearing a mask Cardiovascular:     Pulses:          Radial pulses are 2+ on the right side and 2+ on the left side.       Femoral pulses are 3+ on the right side and 2+ on the left side.      Popliteal pulses are 2+ on the right side and 0 on the left side.  Pulmonary:      Effort: Pulmonary effort is normal.     Breath sounds: Normal breath sounds.  Abdominal:     General: Abdomen is flat.     Palpations: There is mass.  Musculoskeletal:        General: No swelling. Normal range of motion.  Skin:    General: Skin is warm and dry.     Capillary Refill: Capillary refill takes less  than 2 seconds.  Neurological:     General: No focal deficit present.     Mental Status: He is alert.  Psychiatric:        Mood and Affect: Mood normal.        Behavior: Behavior normal.        Thought Content: Thought content normal.        Judgment: Judgment normal.     Data:  CTA CHEST  1. Stable 4.5 cm fusiform aneurysmal dilation of the ascending thoracic aorta. No significant interval change compared to prior imaging from 12/15/2018. Aortic aneurysm NOS (ICD10-I71.9); Aortic Atherosclerosis (ICD10-I70.0). 2. Interval development of a vertically oriented pulmonary nodule in the left upper lobe measuring 1.3 x 0.7 x 1.1 cm. There is some surrounding spiculation as well as a band of associated scarring/atelectasis. Findings are concerning for primary bronchogenic carcinoma. Consider one of the following in 3 months for both low-risk and high-risk individuals: (a) repeat chest CT, (b) follow-up PET-CT, or (c) tissue sampling. This recommendation follows the consensus statement: Guidelines for Management of Incidental Pulmonary Nodules Detected on CT Images: From the Fleischner Society 2017; Radiology 2017; 284:228-243. 3. 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 Emphysema (ICD10-J43.9).  CTA ABD/PELVIS  1. Large fusiform distal infrarenal abdominal aortic aneurysm arising just distal to the origin of the IMA. The maximal aortic diameter is 6.9 cm. Recommend referral to a vascular specialist. This recommendation follows ACR consensus guidelines: White Paper of the ACR Incidental Findings Committee II on Vascular Findings. J Am Coll  Radiol 2013; 10:789-794. 2. 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 3. Bilateral common iliac artery aneurysms measuring up to 3 cm on the right and 3.3 cm on the left. 4. Aneurysmal dilation of the anterior division of the right internal iliac artery measuring up to 3.6 cm. 5. Ectasia of the left internal iliac artery measuring up to 1.7 cm. 6. Ectasias of the left common femoral artery measuring up to 2.1 cm. 7. Focal ectasia of the origins of the superior mesenteric and left renal artery secondary to penetrating atherosclerotic ulcers at the vessel origins. 8. Accessory right renal artery arising distally, below the origin of both main renal arteries. 9. Highly tortuous left external iliac artery. 10. Severe colonic diverticulosis without evidence of active diverticulitis. 11. Additional ancillary findings as above.     Assessment/Plan:     79 year old male presents with the above-noted aneurysm disease.  I have reviewed his CT scan in depth and also reviewed with the patient.  In short he has a very large up to 7 cm abdominal aortic aneurysm and we have discussed his risk of rupture being approximately 15 to 20% within the year.  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  I discussed with him a stepwise fashion.  I discussed with him the options for repair including endovascular and open for his abdominal aortic and internal iliac artery aneurysm on the right.  His common femoral artery aneurysm only has an open option and will need to be repaired at the time of either open or endovascular repair of his abdominal aortic aneurysm.  We will also stage this with coiling of his right hypogastric artery given that any repair would severely limit our options in the future.  We will get this done first we will also get cardiac  clearance to see if  he would be a candidate for open abdominal aortic aneurysm repair which I do think is his best option given all of his aneurysms.  I will also get a popliteal artery duplex to evaluate to make sure that there is no endovascular need for repair while we are correlating his hypogastric.  Unfortunately I think endovascular repair of his aortic aneurysm would require covering his IMA with coiling of his bilateral hypogastric arteries I do think this renders him a very high likelihood of pelvic/colonic ischemia and he demonstrates good understanding of this.    Overall I think his best option would be open repair of his abdominal aortic aneurysm possibly to the left common iliac artery at the bifurcation preserving flow in his left internal iliac artery.  His right internal iliac artery will be coiled prior to this we can ligate his common iliac artery on the right and then replace his common femoral artery on the right given the large aneurysm there.  We can then possibly reimplant his IMA although it does appear diminutive on the CT scan.    All of this of course is pending cardiac clearance.     Waynetta Sandy MD [facility]

## 2020-03-16 NOTE — Progress Notes (Signed)
Patient ID: Carlos Olson, male   DOB: 11-25-1940, 79 y.o.   MRN: [id number]  Reason for Consult: New Patient (Initial Visit)   Referred by Ria Bush, MD  Subjective:     HPI:  Carlos Olson is a 79 y.o. male with known history of thoracic aneurysm and long history of back pain was evaluated MRI was found to have abdominal aortic aneurysm.  Yesterday underwent CT scan.  He has chronic back pain was somewhat alleviated in the past with physical therapy this is unchanged and not acute.  He is never known of any abdominal aortic or lower extremity aneurysms.  He is quite active.  He is a former smoker after 55 years but did quit several years ago.  He does not take any blood thinners does take aspirin daily.  He has never had a cardiologist.  Denies any history of stroke or heart attack.  Did have a brother with significant aneurysmal disease in his abdomen ultimately died from a stroke.  He also has a son and daughter unsure if they have aneurysm disease or not.  Past Medical History:  Diagnosis Date  . AAA (abdominal aortic aneurysm) ([place])   . Atherosclerosis of aortic arch ([place]) 09/25/2015   By CT scan   . CAD (coronary artery disease) 09/25/2015   Mild 3v by CT scan   . Cancer [facility])    prostate  . Colon polyps   . COPD (chronic obstructive pulmonary disease) ([place])   . Diverticulosis of colon   . Femoral bruit    bilateral, found by Dr. Jefm Bryant  . Heart murmur    child  . History of prostate cancer   . IBS (irritable bowel syndrome)    improved after retirement  . 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 Pelvic fracture ([place])   . Pneumonia    child  . Pulmonary nodules 09/25/2015   On screening CT scan, rpt 1 yr   . Shortness of breath    doe   Family History  Problem Relation Age of Onset  . Hypertension Mother   . Cancer Father        bone, prostate  . Diabetes Sister        x 40 years  . Cancer Brother         prostate   Past Surgical History:  Procedure Laterality Date  . ANTERIOR CERVICAL DECOMP/DISCECTOMY FUSION  07/29/2011   Procedure: ANTERIOR CERVICAL DECOMPRESSION/DISCECTOMY FUSION 1 LEVEL;  Surgeon: Jessy Oto, MD;  Location: [place];  Service: Orthopedics;  Laterality: N/A;  Anterior Cervical Discectomy and Fusion C3-4  . CATARACT EXTRACTION W/PHACO Right 07/10/2015   Procedure: CATARACT EXTRACTION PHACO AND INTRAOCULAR LENS PLACEMENT (IOC);  Surgeon: Birder Robson, MD;  Location: [facility];  Service: Ophthalmology;  Laterality: Right;  [place] 00:58   . CATARACT EXTRACTION W/PHACO Left 07/24/2015   Procedure: CATARACT EXTRACTION PHACO AND INTRAOCULAR LENS PLACEMENT (IOC);  Surgeon: Birder Robson, MD;  Location: [facility];  Service: Ophthalmology;  Laterality: Left;  [place] 00:38   . COLONOSCOPY  12/2007   Medhoff, diverticulosis and polyp, rpt 5 yrs  . dental implants    . FORAMINOTOMY 2 LEVEL Left 10/2013   C4/5, C5/6 ([place])  . HERNIA REPAIR     right 05/1999, left 09/04/05  . JOINT REPLACEMENT Right    TKR  . KNEE ARTHROPLASTY Left 05/20/2017   Procedure: COMPUTER ASSISTED TOTAL KNEE ARTHROPLASTY;  Surgeon: Marry Guan,  Laurice Record, MD;  Location: [facility];  Service: Orthopedics;  Laterality: Left;  . KNEE ARTHROSCOPY  09/1997   left  . POSTERIOR CERVICAL FUSION/FORAMINOTOMY N/A 11/22/2013   Procedure: LEFT C4-5 AND C5-6 FORAMINOTOMY;  Surgeon: Jessy Oto, MD;  Location: [place];  Service: Orthopedics;  Laterality: N/A;  . PROSTATECTOMY  08/1990  . 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 TONSILLECTOMY      Short Social History:  Social History   Tobacco Use  . Smoking status: Former Smoker    Packs/day: 2.00    Years: 52.00    Pack years: 104.00    Types: Cigarettes    Quit date: 05/15/2007    Years since quitting: 12.8  . Smokeless tobacco: Never Used  . Tobacco comment: quit 06/16/2007/encouraged to remain smoke free  Substance Use Topics  . Alcohol use: Yes    Alcohol/week: 14.0  standard drinks    Types: 14 Standard drinks or equivalent per week    Comment: 2 drinks a day    Allergies  Allergen Reactions  . Sulfonamide Derivatives Itching  . Sulfasalazine Itching    Current Outpatient Medications  Medication Sig Dispense Refill  . acetaminophen (TYLENOL) 500 MG tablet Take 1,000 mg by mouth 3 (three) times daily as needed for moderate pain or headache.     . albuterol (VENTOLIN HFA) 108 (90 Base) MCG/ACT inhaler Inhale 2 puffs into the lungs every 6 (six) hours as needed for wheezing or shortness of breath. 1 Inhaler 3  . aspirin EC 81 MG tablet Take 1 tablet (81 mg total) by mouth daily.    . Cholecalciferol (VITAMIN D3) 25 MCG (1000 UT) CAPS Take 1 capsule (1,000 Units total) by mouth daily. 30 capsule   . Fluticasone-Umeclidin-Vilant (TRELEGY ELLIPTA) 100-62.5-25 MCG/INH AEPB Inhale 1 puff into the lungs daily. 60 each 5   No current facility-administered medications for this visit.    Review of Systems  Constitutional:  Constitutional negative. HENT: HENT negative.  Eyes: Eyes negative.  Respiratory: Respiratory negative.  Cardiovascular: Cardiovascular negative.  GI: Gastrointestinal negative.  Musculoskeletal: Positive for back pain.  Skin: Skin negative.  Neurological: Neurological negative. Hematologic: Hematologic/lymphatic negative.  Psychiatric: Psychiatric negative.        Objective:  Objective   Vitals:   03/16/20 1035  BP: (!) 159/85  Pulse: 74  Resp: 20  Temp: 98 F (36.7 C)  SpO2: 95%  Weight: 214 lb (97.1 kg)  Height: 6\' 3"  (1.905 m)   Body mass index is 26.75 kg/m.  Physical Exam HENT:     Head: Normocephalic.     Nose:     Comments: Wearing a mask Cardiovascular:     Pulses:          Radial pulses are 2+ on the right side and 2+ on the left side.       Femoral pulses are 3+ on the right side and 2+ on the left side.      Popliteal pulses are 2+ on the right side and 0 on the left side.  Pulmonary:      Effort: Pulmonary effort is normal.     Breath sounds: Normal breath sounds.  Abdominal:     General: Abdomen is flat.     Palpations: There is mass.  Musculoskeletal:        General: No swelling. Normal range of motion.  Skin:    General: Skin is warm and dry.     Capillary Refill: Capillary refill takes less  than 2 seconds.  Neurological:     General: No focal deficit present.     Mental Status: He is alert.  Psychiatric:        Mood and Affect: Mood normal.        Behavior: Behavior normal.        Thought Content: Thought content normal.        Judgment: Judgment normal.     Data:  CTA CHEST  1. Stable 4.5 cm fusiform aneurysmal dilation of the ascending thoracic aorta. No significant interval change compared to prior imaging from 12/15/2018. Aortic aneurysm NOS (ICD10-I71.9); Aortic Atherosclerosis (ICD10-I70.0). 2. Interval development of a vertically oriented pulmonary nodule in the left upper lobe measuring 1.3 x 0.7 x 1.1 cm. There is some surrounding spiculation as well as a band of associated scarring/atelectasis. Findings are concerning for primary bronchogenic carcinoma. Consider one of the following in 3 months for both low-risk and high-risk individuals: (a) repeat chest CT, (b) follow-up PET-CT, or (c) tissue sampling. This recommendation follows the consensus statement: Guidelines for Management of Incidental Pulmonary Nodules Detected on CT Images: From the Fleischner Society 2017; Radiology 2017; 284:228-243. 3. 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 Emphysema (ICD10-J43.9).  CTA ABD/PELVIS  1. Large fusiform distal infrarenal abdominal aortic aneurysm arising just distal to the origin of the IMA. The maximal aortic diameter is 6.9 cm. Recommend referral to a vascular specialist. This recommendation follows ACR consensus guidelines: White Paper of the ACR Incidental Findings Committee II on Vascular Findings. J Am Coll  Radiol 2013; 10:789-794. 2. 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 3. Bilateral common iliac artery aneurysms measuring up to 3 cm on the right and 3.3 cm on the left. 4. Aneurysmal dilation of the anterior division of the right internal iliac artery measuring up to 3.6 cm. 5. Ectasia of the left internal iliac artery measuring up to 1.7 cm. 6. Ectasias of the left common femoral artery measuring up to 2.1 cm. 7. Focal ectasia of the origins of the superior mesenteric and left renal artery secondary to penetrating atherosclerotic ulcers at the vessel origins. 8. Accessory right renal artery arising distally, below the origin of both main renal arteries. 9. Highly tortuous left external iliac artery. 10. Severe colonic diverticulosis without evidence of active diverticulitis. 11. Additional ancillary findings as above.     Assessment/Plan:     79 year old male presents with the above-noted aneurysm disease.  I have reviewed his CT scan in depth and also reviewed with the patient.  In short he has a very large up to 7 cm abdominal aortic aneurysm and we have discussed his risk of rupture being approximately 15 to 20% within the year.  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  I discussed with him a stepwise fashion.  I discussed with him the options for repair including endovascular and open for his abdominal aortic and internal iliac artery aneurysm on the right.  His common femoral artery aneurysm only has an open option and will need to be repaired at the time of either open or endovascular repair of his abdominal aortic aneurysm.  We will also stage this with coiling of his right hypogastric artery given that any repair would severely limit our options in the future.  We will get this done first we will also get cardiac  clearance to see if  he would be a candidate for open abdominal aortic aneurysm repair which I do think is his best option given all of his aneurysms.  I will also get a popliteal artery duplex to evaluate to make sure that there is no endovascular need for repair while we are correlating his hypogastric.  Unfortunately I think endovascular repair of his aortic aneurysm would require covering his IMA with coiling of his bilateral hypogastric arteries I do think this renders him a very high likelihood of pelvic/colonic ischemia and he demonstrates good understanding of this.    Overall I think his best option would be open repair of his abdominal aortic aneurysm possibly to the left common iliac artery at the bifurcation preserving flow in his left internal iliac artery.  His right internal iliac artery will be coiled prior to this we can ligate his common iliac artery on the right and then replace his common femoral artery on the right given the large aneurysm there.  We can then possibly reimplant his IMA although it does appear diminutive on the CT scan.    All of this of course is pending cardiac clearance.     Waynetta Sandy MD [facility]

## 2020-03-20 ENCOUNTER — Ambulatory Visit (HOSPITAL_COMMUNITY)
Admission: RE | Admit: 2020-03-20 | Discharge: 2020-03-20 | Disposition: A | Discharge status: Home / Self Care | Payer: Medicare HMO | Source: Ambulatory Visit | Attending: Vascular Surgery | Admitting: Vascular Surgery

## 2020-03-20 ENCOUNTER — Other Ambulatory Visit: Payer: Self-pay

## 2020-03-20 DIAGNOSIS — I2510 Atherosclerotic heart disease of native coronary artery without angina pectoris: Secondary | ICD-10-CM | POA: Insufficient documentation

## 2020-03-24 ENCOUNTER — Other Ambulatory Visit (HOSPITAL_COMMUNITY): Payer: Medicare HMO

## 2020-03-24 ENCOUNTER — Other Ambulatory Visit (HOSPITAL_COMMUNITY)
Admission: RE | Admit: 2020-03-24 | Discharge: 2020-03-24 | Disposition: A | Discharge status: Home / Self Care | Payer: Medicare HMO | Source: Ambulatory Visit | Attending: Vascular Surgery | Admitting: Vascular Surgery

## 2020-03-24 DIAGNOSIS — Z20822 Contact with and (suspected) exposure to covid-19: Secondary | ICD-10-CM | POA: Diagnosis not present

## 2020-03-24 DIAGNOSIS — Z01812 Encounter for preprocedural laboratory examination: Secondary | ICD-10-CM | POA: Diagnosis not present

## 2020-03-24 LAB — SARS CORONAVIRUS 2 (TAT 6-24 HRS): SARS Coronavirus 2: NEGATIVE

## 2020-03-26 ENCOUNTER — Ambulatory Visit (HOSPITAL_COMMUNITY)
Admission: RE | Admit: 2020-03-26 | Discharge: 2020-03-26 | Disposition: A | Discharge status: Home / Self Care | Payer: Medicare HMO | Attending: Vascular Surgery | Admitting: Vascular Surgery

## 2020-03-26 ENCOUNTER — Encounter: Payer: Self-pay | Admitting: Vascular Surgery

## 2020-03-26 ENCOUNTER — Other Ambulatory Visit: Payer: Self-pay

## 2020-03-26 ENCOUNTER — Encounter (HOSPITAL_COMMUNITY)
Admission: RE | Disposition: A | Discharge status: Home / Self Care | Payer: Self-pay | Source: Home / Self Care | Attending: Vascular Surgery

## 2020-03-26 DIAGNOSIS — I700 Atherosclerosis of aorta: Secondary | ICD-10-CM | POA: Diagnosis not present

## 2020-03-26 DIAGNOSIS — J449 Chronic obstructive pulmonary disease, unspecified: Secondary | ICD-10-CM | POA: Insufficient documentation

## 2020-03-26 DIAGNOSIS — Z8546 Personal history of malignant neoplasm of prostate: Secondary | ICD-10-CM | POA: Insufficient documentation

## 2020-03-26 DIAGNOSIS — Z882 Allergy status to sulfonamides status: Secondary | ICD-10-CM | POA: Insufficient documentation

## 2020-03-26 DIAGNOSIS — Z79899 Other long term (current) drug therapy: Secondary | ICD-10-CM | POA: Insufficient documentation

## 2020-03-26 DIAGNOSIS — I714 Abdominal aortic aneurysm, without rupture: Secondary | ICD-10-CM | POA: Insufficient documentation

## 2020-03-26 DIAGNOSIS — Z7982 Long term (current) use of aspirin: Secondary | ICD-10-CM | POA: Insufficient documentation

## 2020-03-26 DIAGNOSIS — Z87891 Personal history of nicotine dependence: Secondary | ICD-10-CM | POA: Insufficient documentation

## 2020-03-26 DIAGNOSIS — I2510 Atherosclerotic heart disease of native coronary artery without angina pectoris: Secondary | ICD-10-CM | POA: Insufficient documentation

## 2020-03-26 DIAGNOSIS — I724 Aneurysm of artery of lower extremity: Secondary | ICD-10-CM | POA: Insufficient documentation

## 2020-03-26 HISTORY — PX: EMBOLIZATION: CATH118239

## 2020-03-26 LAB — POCT I-STAT, CHEM 8
BUN: 22 mg/dL (ref 8–23)
Calcium, Ion: 1.23 mmol/L (ref 1.15–1.40)
Chloride: 104 mmol/L (ref 98–111)
Creatinine, Ser: 1 mg/dL (ref 0.61–1.24)
Glucose, Bld: 95 mg/dL (ref 70–99)
HCT: 46 % (ref 39.0–52.0)
Hemoglobin: 15.6 g/dL (ref 13.0–17.0)
Potassium: 5 mmol/L (ref 3.5–5.1)
Sodium: 142 mmol/L (ref 135–145)
TCO2: 28 mmol/L (ref 22–32)

## 2020-03-26 LAB — POCT ACTIVATED CLOTTING TIME: Activated Clotting Time: 125 seconds

## 2020-03-26 SURGERY — EMBOLIZATION
Anesthesia: LOCAL | Laterality: Right

## 2020-03-26 MED ORDER — HEPARIN (PORCINE) IN NACL 1000-0.9 UT/500ML-% IV SOLN
INTRAVENOUS | Status: AC
  Filled 2020-03-26: qty 500

## 2020-03-26 MED ORDER — ONDANSETRON HCL 4 MG/2ML IJ SOLN: 4.0000 mg | Freq: Four times a day (QID) | INTRAMUSCULAR | Status: DC | PRN

## 2020-03-26 MED ORDER — HYDRALAZINE HCL 20 MG/ML IJ SOLN: 5.0000 mg | INTRAMUSCULAR | Status: DC | PRN

## 2020-03-26 MED ORDER — MIDAZOLAM HCL 2 MG/2ML IJ SOLN
INTRAMUSCULAR | Status: AC
  Filled 2020-03-26: qty 2

## 2020-03-26 MED ORDER — FENTANYL CITRATE (PF) 100 MCG/2ML IJ SOLN
INTRAMUSCULAR | Status: DC | PRN
Start: 2020-03-26 — End: 2020-03-26
  Administered 2020-03-26 (×3): 50 ug via INTRAVENOUS

## 2020-03-26 MED ORDER — HEPARIN SODIUM (PORCINE) 1000 UNIT/ML IJ SOLN
INTRAMUSCULAR | Status: AC
  Filled 2020-03-26: qty 1

## 2020-03-26 MED ORDER — FENTANYL CITRATE (PF) 100 MCG/2ML IJ SOLN
INTRAMUSCULAR | Status: AC
  Filled 2020-03-26: qty 2

## 2020-03-26 MED ORDER — SODIUM CHLORIDE 0.9 % WEIGHT BASED INFUSION: 1.0000 mL/kg/h | INTRAVENOUS | Status: DC

## 2020-03-26 MED ORDER — ATROPINE SULFATE 1 MG/10ML IJ SOSY
PREFILLED_SYRINGE | INTRAMUSCULAR | Status: AC
  Filled 2020-03-26: qty 10

## 2020-03-26 MED ORDER — SODIUM CHLORIDE 0.9% FLUSH: 3.0000 mL | INTRAVENOUS | Status: DC | PRN

## 2020-03-26 MED ORDER — SODIUM CHLORIDE 0.9% FLUSH: 3.0000 mL | Freq: Two times a day (BID) | INTRAVENOUS | Status: DC

## 2020-03-26 MED ORDER — OXYCODONE HCL 5 MG PO TABS: 5.0000 mg | ORAL_TABLET | ORAL | Status: DC | PRN

## 2020-03-26 MED ORDER — MORPHINE SULFATE (PF) 2 MG/ML IV SOLN: 2.0000 mg | INTRAVENOUS | Status: DC | PRN

## 2020-03-26 MED ORDER — MIDAZOLAM HCL 2 MG/2ML IJ SOLN
INTRAMUSCULAR | Status: DC | PRN
  Administered 2020-03-26 (×3): 1 mg via INTRAVENOUS

## 2020-03-26 MED ORDER — IODIXANOL 320 MG/ML IV SOLN
INTRAVENOUS | Status: DC | PRN
  Administered 2020-03-26: 135 mL via INTRA_ARTERIAL

## 2020-03-26 MED ORDER — ACETAMINOPHEN 325 MG PO TABS: 650.0000 mg | ORAL_TABLET | ORAL | Status: DC | PRN

## 2020-03-26 MED ORDER — HEPARIN (PORCINE) IN NACL 1000-0.9 UT/500ML-% IV SOLN
INTRAVENOUS | Status: DC | PRN
  Administered 2020-03-26 (×2): 500 mL

## 2020-03-26 MED ORDER — SODIUM CHLORIDE 0.9 % IV SOLN: INTRAVENOUS | Status: DC

## 2020-03-26 MED ORDER — LIDOCAINE HCL (PF) 1 % IJ SOLN
INTRAMUSCULAR | Status: DC | PRN
  Administered 2020-03-26: 20 mL via INTRADERMAL

## 2020-03-26 MED ORDER — LIDOCAINE HCL (PF) 1 % IJ SOLN
INTRAMUSCULAR | Status: AC
  Filled 2020-03-26: qty 30

## 2020-03-26 MED ORDER — LABETALOL HCL 5 MG/ML IV SOLN: 10.0000 mg | INTRAVENOUS | Status: DC | PRN

## 2020-03-26 MED ORDER — SODIUM CHLORIDE 0.9 % IV SOLN: 250.0000 mL | INTRAVENOUS | Status: DC | PRN

## 2020-03-26 MED ORDER — HEPARIN SODIUM (PORCINE) 1000 UNIT/ML IJ SOLN
INTRAMUSCULAR | Status: DC | PRN
  Administered 2020-03-26: 3000 [IU] via INTRAVENOUS

## 2020-03-26 SURGICAL SUPPLY — 22 items
CATH ANGIO 5F BER2 65CM (CATHETERS) ×1 IMPLANT
CATH DIREXION BERN TIP 130CM (CATHETERS) ×1 IMPLANT
CATH OMNI FLUSH 5F 65CM (CATHETERS) ×1 IMPLANT
CATH QUICKCROSS SUPP .035X90CM (MICROCATHETER) ×1 IMPLANT
COIL IDC 2D HELICAL 10MMX30CM (Embolic) ×1 IMPLANT
COIL IDC 2D HELICAL 12MMX30CM (Embolic) ×1 IMPLANT
COIL IDC 2D HELICAL 6MMX10CM (Embolic) ×1 IMPLANT
COIL IDC 2D HELICAL 6MMX20CM (Embolic) ×2 IMPLANT
COIL IDC 2D HELICAL 8MMX20CM (Embolic) ×1 IMPLANT
DEVICE TORQUE H2O (MISCELLANEOUS) ×1 IMPLANT
GLIDEWIRE ADV .035X180CM (WIRE) ×1 IMPLANT
GUIDEWIRE ANGLED .035X150CM (WIRE) ×1 IMPLANT
KIT MICROPUNCTURE NIT STIFF (SHEATH) ×1 IMPLANT
KIT PV (KITS) ×2 IMPLANT
SHEATH FLEX ANSEL ANG 5F 45CM (SHEATH) ×1 IMPLANT
SHEATH PINNACLE 5F 10CM (SHEATH) ×1 IMPLANT
SHEATH PROBE COVER 6X72 (BAG) ×1 IMPLANT
SYR MEDRAD MARK 7 150ML (SYRINGE) ×2 IMPLANT
TRANSDUCER W/STOPCOCK (MISCELLANEOUS) ×2 IMPLANT
TRAY PV CATH (CUSTOM PROCEDURE TRAY) ×2 IMPLANT
WIRE AMPLATZ SS-J .035X180CM (WIRE) ×1 IMPLANT
WIRE STARTER BENTSON 035X150 (WIRE) ×1 IMPLANT

## 2020-03-26 NOTE — Interval H&P Note (Signed)
History and Physical Interval Note:  03/26/2020 10:25 AM  Carlos Olson  has presented today for surgery, with the diagnosis of triple A.  The various methods of treatment have been discussed with the patient and family. After consideration of risks, benefits and other options for treatment, the patient has consented to  Procedure(s): EMBOLIZATION (Right) as a surgical intervention.  The patient's history has been reviewed, patient examined, no change in status, stable for surgery.  I have reviewed the patient's chart and labs.  Questions were answered to the patient's satisfaction.     [name]

## 2020-03-26 NOTE — Progress Notes (Signed)
Up and walked and tolerated well; left groin stable, no bleeding or hematoma 

## 2020-03-26 NOTE — Op Note (Signed)
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 Donzetta Matters, MD Procedure Performed: 1.  Ultrasound-guided cannulation left common femoral artery 2.  Aortogram 3.  Selection of right SFA and right lower extremity angiography 4.  Selection of right hypogastric artery anterior and posterior branches and coil embolization with 3 coils x6 mm, 8 mm coil, 10 mm coil, 12 mm coil 5.  Retrograde right common femoral angiography 6.  Moderate sedation with fentanyl and Versed for 87 minutes   Indications: 79 year old male with 7 cm abdominal aortic aneurysm with a greater than 3 cm hypogastric artery aneurysm.  He also has a 4 cm common femoral artery aneurysm on the right.  He is indicated for likely open repair of these aneurysms.  We have elected for coil embolization of the right as well as right lower extremity angiography to evaluate his popliteal artery aneurysm.  Findings: There is a very large aortic aneurysm that was difficult to identify angiographically given the large lumen.  There is a large right common femoral artery aneurysm that appears possibly to be pseudoaneurysmal.  The right SFA is normal size there than 2 areas of outpouching one that is greater than 2 cm there is another outpouching.  There is a high takeoff of the posterior tibial artery.  Patient will likely need CT angio of right lower extremity followed by bypass after he recovers from abdominal aortic aneurysm repair.  The right common femoral artery aneurysm will be fixed at the time of abdominal aortic aneurysm repair.  We are able to selected the anterior and posterior branches of the hypogastric artery.  We could not identify any aneurysm by angiogram but using CT scan alongside we  were able to coil embolize the anterior posterior main trunks as well as the main trunk of the hypogastric artery.   Procedure:  The patient was identified in the holding area and taken to room 8.  The patient was then placed supine on the table and prepped and draped in the usual sterile fashion.  A time out was called.  Ultrasound was used to evaluate the left common femoral artery this was ectatic.  There was an area that was a normal-appearing there is anesthetized 1% lidocaine in this area was cannulated with micropuncture needle followed by wire sheath.  And images saved the permanent record.  We did use a Glidewire advantage and bare catheter to traverse into the aorta given the tortuosity.  We placed an Omni catheter performed aortogram.  We then crossed the bifurcation with Glidewire advantage we used a quick cross catheter we performed angiography of the right common femoral artery which demonstrated a large aneurysm there.  He then has a normal-appearing SFA with 2 outpouchings one that is least 2 cm and one that is smaller.  We elected no intervention on these he also has a high takeoff of his posterior tibial artery.  We then used a Glidewire advantage and very catheter to select the hypogastric artery.    A long 5 French sheath was then placed up and over the bifurcation and 3000 units of heparin was administered..  We did use Amplatz wire for this.  We then performed angiography selectively of the anterior posterior branches of the hypogastric and these were then coiled with 6 x 20 coils x2 and then  a 6 x 10 coil.  We then packed with an 8 x 20 coil followed by 10 x 30 coil and a 12 x 30 coil into the origin of the hypogastric artery.  Completion there was still some flow but patient had received 3000 units of heparin.  With this we retracted our sheath into the left external leg artery.  We performed retrograde angiography of the common femoral artery this does not appear to be a large aneurysm but  rather is ectatic.  Wire was removed.  Sheath will be pulled in postoperative holding.  He tolerated procedure without any complication.  Contrast: 135cc  Kieli Golladay C. Donzetta Matters, MD [facility] Office: [phone] Pager: [phone]

## 2020-03-26 NOTE — Discharge Instructions (Signed)
Femoral Site Care This sheet gives you information about how to care for yourself after your procedure. Your health care provider may also give you more specific instructions. If you have problems or questions, contact your health care provider. What can I expect after the procedure? After the procedure, it is common to have:  Bruising that usually fades within 1-2 weeks.  Tenderness at the site. Follow these instructions at home: Wound care  Follow instructions from your health care provider about how to take care of your insertion site. Make sure you: ? Wash your hands with soap and water before you change your bandage (dressing). If soap and water are not available, use hand sanitizer. ? Change your dressing as told by your health care provider. ? Leave stitches (sutures), skin glue, or adhesive strips in place. These skin closures may need to stay in place for 2 weeks or longer. If adhesive strip edges start to loosen and curl up, you may trim the loose edges. Do not remove adhesive strips completely unless your health care provider tells you to do that.  Do not take baths, swim, or use a hot tub until your health care provider approves.  You may shower 24-48 hours after the procedure or as told by your health care provider. ? Gently wash the site with plain soap and water. ? Pat the area dry with a clean towel. ? Do not rub the site. This may cause bleeding.  Do not apply powder or lotion to the site. Keep the site clean and dry.  Check your femoral site every day for signs of infection. Check for: ? Redness, swelling, or pain. ? Fluid or blood. ? Warmth. ? Pus or a bad smell. Activity  For the first 2-3 days after your procedure, or as long as directed: ? Avoid climbing stairs as much as possible. ? Do not squat.  Do not lift anything that is heavier than 10 lb (4.5 kg), or the limit that you are told, until your health care provider says that it is safe.  Rest as  directed. ? Avoid sitting for a long time without moving. Get up to take short walks every 1-2 hours.  Do not drive for 24 hours if you were given a medicine to help you relax (sedative). General instructions  Take over-the-counter and prescription medicines only as told by your health care provider.  Keep all follow-up visits as told by your health care provider. This is important. Contact a health care provider if you have:  A fever or chills.  You have redness, swelling, or pain around your insertion site. Get help right away if:  The catheter insertion area swells very fast.  You pass out.  You suddenly start to sweat or your skin gets clammy.  The catheter insertion area is bleeding, and the bleeding does not stop when you hold steady pressure on the area.  The area near or just beyond the catheter insertion site becomes pale, cool, tingly, or numb. These symptoms may represent a serious problem that is an emergency. Do not wait to see if the symptoms will go away. Get medical help right away. Call your local emergency services (911 in the [place] Do not drive yourself to the hospital. Summary  After the procedure, it is common to have bruising that usually fades within 1-2 weeks.  Check your femoral site every day for signs of infection.  Do not lift anything that is heavier than 10 lb (4.5 kg), or the   limit that you are told, until your health care provider says that it is safe. This information is not intended to replace advice given to you by your health care provider. Make sure you discuss any questions you have with your health care provider. Document Revised: 06/29/2017 Document Reviewed: 06/29/2017 Elsevier Patient Education  2020 Elsevier Inc.  

## 2020-03-26 NOTE — Progress Notes (Signed)
VS pre sheath pull: BP 182/76, HR 47, RR 20, O2 95% room air Sheath location: left femoral Arterial/Venous: arterial Manual pressure hold time: 20 minutes Bedrest begins: 1340 Pulses: left PT palpable VS post sheath pull: BP 165/81, HR 60, RR 17, O2 95% room air

## 2020-03-27 ENCOUNTER — Encounter (HOSPITAL_COMMUNITY): Payer: Self-pay | Admitting: Vascular Surgery

## 2020-03-27 ENCOUNTER — Telehealth: Payer: Self-pay | Admitting: Internal Medicine

## 2020-03-27 NOTE — Telephone Encounter (Signed)
   [facility] Pre-operative Risk Assessment    HEARTCARE STAFF: - Please ensure there is not already an duplicate clearance open for this procedure. - Under Visit Info/Reason for Call, type in Other and utilize the format Clearance MM/DD/YY or Clearance TBD. Do not use dashes or single digits. - If request is for dental extraction, please clarify the # of teeth to be extracted.  Request for surgical clearance:  1. What type of surgery is being performed? abdominal aortic aneurysm, aortogram  2. When is this surgery scheduled? 04/06/2020  3. What type of clearance is required (medical clearance vs. Pharmacy clearance to hold med vs. Both)? medical  4. Are there any medications that need to be held prior to surgery and how long? Leaving up to cardiology  5. Practice name and name of physician performing surgery? [facility] [name]   6. What is the office phone number? [phone]   7.   What is the office fax number? [phone]  8.   Anesthesia type (None, local, MAC, general) ? Moderate sedation   Carlos Olson 03/27/2020, 11:18 AM  _________________________________________________________________   (provider comments below)

## 2020-03-27 NOTE — Telephone Encounter (Signed)
Patient has a new patient pre op appointment scheduled on 03/30/2020 with Dr Gasper Sells. Will fax back to requesting surgeons office to make them aware.

## 2020-03-27 NOTE — Telephone Encounter (Signed)
   Primary Cardiologist: NEW - has not seen [facility], needs appt with doctor only as new patient  Chart reviewed as part of pre-operative protocol coverage. Because of Carlos Olson's past medical history and time since last visit, they will require a follow-up visit in order to better assess preoperative cardiovascular risk.  Pre-op covering staff: - Please schedule appointment and call patient to inform them. If patient already had an upcoming appointment within acceptable timeframe, please add "pre-op clearance" to the appointment notes so provider is aware. - Please contact requesting surgeon's office via preferred method (i.e, phone, fax) to inform them of need for appointment prior to surgery.  If applicable, this message will also be routed to pharmacy pool and/or primary cardiologist for input on holding anticoagulant/antiplatelet agent as requested below so that this information is available to the clearing provider at time of patient's appointment.   Tami Lin Amesha Bailey, PA  03/27/2020, 2:45 PM

## 2020-03-30 ENCOUNTER — Ambulatory Visit (INDEPENDENT_AMBULATORY_CARE_PROVIDER_SITE_OTHER): Payer: Medicare HMO | Admitting: Internal Medicine

## 2020-03-30 ENCOUNTER — Encounter: Payer: Self-pay | Admitting: Internal Medicine

## 2020-03-30 ENCOUNTER — Other Ambulatory Visit: Payer: Self-pay

## 2020-03-30 VITALS — BP 150/84 | HR 94 | Ht 75.0 in | Wt 212.8 lb

## 2020-03-30 DIAGNOSIS — I712 Thoracic aortic aneurysm, without rupture: Secondary | ICD-10-CM | POA: Diagnosis not present

## 2020-03-30 DIAGNOSIS — I714 Abdominal aortic aneurysm, without rupture, unspecified: Secondary | ICD-10-CM

## 2020-03-30 DIAGNOSIS — I700 Atherosclerosis of aorta: Secondary | ICD-10-CM | POA: Diagnosis not present

## 2020-03-30 DIAGNOSIS — I7121 Aneurysm of the ascending aorta, without rupture: Secondary | ICD-10-CM

## 2020-03-30 DIAGNOSIS — Z01810 Encounter for preprocedural cardiovascular examination: Secondary | ICD-10-CM | POA: Diagnosis not present

## 2020-03-30 NOTE — Patient Instructions (Signed)
Medication Instructions:  Your physician recommends that you continue on your current medications as directed. Please refer to the Current Medication list given to you today.  *If you need a refill on your cardiac medications before your next appointment, please call your pharmacy*   Lab Work: none If you have labs (blood work) drawn today and your tests are completely normal, you will receive your results only by: Marland Kitchen MyChart Message (if you have MyChart) OR . A paper copy in the mail If you have any lab test that is abnormal or we need to change your treatment, we will call you to review the results.   Testing/Procedures: Your physician has requested that you have an echocardiogram. Echocardiography is a painless test that uses sound waves to create images of your heart. It provides your doctor with information about the size and shape of your heart and how well your heart's chambers and valves are working. This procedure takes approximately one hour. There are no restrictions for this procedure.   Cardiac CT in March 2022... our office will call you but please mark your calendar and call us if you do not hear anything [phone].     Follow-Up: At [facility], you and your health needs are our priority.  As part of our continuing mission to provide you with exceptional heart care, we have created designated Provider Care Teams.  These Care Teams include your primary Cardiologist (physician) and Advanced Practice Providers (APPs -  Physician Assistants and Nurse Practitioners) who all work together to provide you with the care you need, when you need it.  We recommend signing up for the patient portal called "MyChart".  Sign up information is provided on this After Visit Summary.  MyChart is used to connect with patients for Virtual Visits (Telemedicine).  Patients are able to view lab/test results, encounter notes, upcoming appointments, etc.  Non-urgent messages can be sent to your  provider as well.   To learn more about what you can do with MyChart, go to [email or web address].    Your next appointment:   6 month(s)  The format for your next appointment:   In Person  Provider:   You may see Werner Lean, MD or one of the following Advanced Practice Providers on your designated Care Team:    Melina Copa, PA-C  Ermalinda Barrios, PA-C    Other Instructions

## 2020-03-30 NOTE — Progress Notes (Signed)
Cardiology Office Note:    Date:  03/30/2020   ID:  Karleen Hampshire, DOB [birth year], MRN [id number]  PCP:  Ria Bush, MD   Referring MD: Ria Bush, MD   CC: Pre op for aneurysms Consulted for the evaluation of preoperative risk stratification for abdominal aortic aneurysm repair at the behest of Dr. Anice Paganini  History of Present Illness:    Carlos Olson is a 79 y.o. male with a hx of CAD (per chart review only with no coronary calcium on recent imaging), Abdominal aortic aneurysm last 6.9 cm, ascending aortic aneurysm 4.5 (stable from 2020-2021) abdominal aortic aneurysm 6.9 cm who presents for risk stratification/optimization.  Patient is 6'3.  No ectopia lentis.  No FH of dissection.    Patient notes that he is doing well. Plays computers games and has decreased activity since diagnosis of AAA and TAA.  Patient walked about 1 mile prior to this.  Limited by back pain.  No chest pain, chest pressure, no chest stinging.  No PND, no orthopnea, no SOB, or DOE, no Syncope.    Ambulatory blood pressure daily 130/80  No BP above 140/90.  Checks daily.  Past Medical History:  Diagnosis Date  . AAA (abdominal aortic aneurysm) ([place])   . Atherosclerosis of aortic arch ([place]) 09/25/2015   By CT scan   . CAD (coronary artery disease) 09/25/2015   Mild 3v by CT scan   . Cancer [facility])    prostate  . Colon polyps   . COPD (chronic obstructive pulmonary disease) ([place])   . Diverticulosis of colon   . Femoral bruit    bilateral, found by Dr. Jefm Bryant  . Heart murmur    child  . History of prostate cancer   . IBS (irritable bowel syndrome)    improved after retirement  . 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 Pelvic fracture ([place])   . Pneumonia    child  . Pulmonary nodules 09/25/2015   On screening CT scan, rpt 1 yr   . Shortness of breath    doe    Past Surgical History:  Procedure Laterality Date  . ANTERIOR CERVICAL  DECOMP/DISCECTOMY FUSION  07/29/2011   Procedure: ANTERIOR CERVICAL DECOMPRESSION/DISCECTOMY FUSION 1 LEVEL;  Surgeon: Jessy Oto, MD;  Location: [facility];  Service: Orthopedics;  Laterality: N/A;  Anterior Cervical Discectomy and Fusion C3-4  . CATARACT EXTRACTION W/PHACO Right 07/10/2015   Procedure: CATARACT EXTRACTION PHACO AND INTRAOCULAR LENS PLACEMENT (IOC);  Surgeon: Birder Robson, MD;  Location: [facility];  Service: Ophthalmology;  Laterality: Right;  [place] 00:58   . CATARACT EXTRACTION W/PHACO Left 07/24/2015   Procedure: CATARACT EXTRACTION PHACO AND INTRAOCULAR LENS PLACEMENT (IOC);  Surgeon: Birder Robson, MD;  Location: [facility];  Service: Ophthalmology;  Laterality: Left;  [place] 00:38   . COLONOSCOPY  12/2007   [place], diverticulosis and polyp, rpt 5 yrs  . dental implants    . EMBOLIZATION Right 03/26/2020   Procedure: EMBOLIZATION;  Surgeon: Waynetta Sandy, MD;  Location: [facility];  Service: Cardiovascular;  Laterality: Right;  . FORAMINOTOMY 2 LEVEL Left 10/2013   C4/5, C5/6 ([place])  . HERNIA REPAIR     right 05/1999, left 09/04/05  . JOINT REPLACEMENT Right    TKR  . KNEE ARTHROPLASTY Left 05/20/2017   Procedure: COMPUTER ASSISTED TOTAL KNEE ARTHROPLASTY;  Surgeon: Dereck Leep, MD;  Location: [facility];  Service: Orthopedics;  Laterality: Left;  .  KNEE ARTHROSCOPY  09/1997   left  . POSTERIOR CERVICAL FUSION/FORAMINOTOMY N/A 11/22/2013   Procedure: LEFT C4-5 AND C5-6 FORAMINOTOMY;  Surgeon: Jessy Oto, MD;  Location: [place];  Service: Orthopedics;  Laterality: N/A;  . PROSTATECTOMY  08/1990  . 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 TONSILLECTOMY     Current Medications: Current Meds  Medication Sig  . acetaminophen (TYLENOL) 500 MG tablet Take 1,000 mg by mouth 3 (three) times daily as needed (back pain/headaches).   Marland Kitchen albuterol (VENTOLIN HFA) 108 (90 Base) MCG/ACT inhaler Inhale 2 puffs into the lungs every 6 (six) hours as needed for wheezing or  shortness of breath.  Marland Kitchen aspirin EC 81 MG tablet Take 1 tablet (81 mg total) by mouth daily.  . Fluticasone-Umeclidin-Vilant (TRELEGY ELLIPTA) 100-62.5-25 MCG/INH AEPB Inhale 1 puff into the lungs daily.  . Multiple Vitamin (MULTIVITAMIN WITH MINERALS) TABS tablet Take 1 tablet by mouth daily. Multivitamin for Men    Allergies:   Sulfonamide derivatives and Sulfasalazine   Social History   Socioeconomic History  . Marital status: Married    Spouse name: Not on file  . Number of children: 2  . Years of education: Not on file  . Highest education level: Not on file  Occupational History  . Occupation: Medical illustrator, retired  Tobacco Use  . Smoking status: Former Smoker    Packs/day: 2.00    Years: 52.00    Pack years: 104.00    Types: Cigarettes    Quit date: 05/15/2007    Years since quitting: 12.8  . Smokeless tobacco: Never Used  . Tobacco comment: quit 06/16/2007/encouraged to remain smoke free  Vaping Use  . Vaping Use: Never used  Substance and Sexual Activity  . Alcohol use: Yes    Alcohol/week: 14.0 standard drinks    Types: 14 Standard drinks or equivalent per week    Comment: 2 drinks a day  . Drug use: No  . Sexual activity: Never  Other Topics Concern  . Not on file  Social History Narrative   Prefers I call him "[name]"   Lives with wife. Grown children.    Occupation: retired, was in Insurance underwriter business    Activity: home exercise routine dialy   Diet: some water, vegetables daily    Social Determinants of Radio broadcast assistant Strain:   . Difficulty of Paying Living Expenses: Not on file  Food Insecurity:   . Worried About Charity fundraiser in the Last Year: Not on file  . Ran Out of Food in the Last Year: Not on file  Transportation Needs:   . Lack of Transportation (Medical): Not on file  . Lack of Transportation (Non-Medical): Not on file  Physical Activity:   . Days of Exercise per Week: Not on file  . Minutes of Exercise per Session:  Not on file  Stress:   . Feeling of Stress : Not on file  Social Connections:   . Frequency of Communication with Friends and Family: Not on file  . Frequency of Social Gatherings with Friends and Family: Not on file  . Attends Religious Services: Not on file  . Active Member of Clubs or Organizations: Not on file  . Attends Archivist Meetings: Not on file  . Marital Status: Not on file    Family History: The patient's family history includes Cancer in his brother and father; Diabetes in his sister; Hypertension in his mother. No hx of dissection thought brother had  AAA  ROS:   Please see the history of present illness.    All other systems reviewed and are negative.  EKGs/Labs/Other Studies Reviewed:    The following studies were reviewed today:  EKG:  EKG is  ordered today.  The ekg ordered today demonstrates  SR rate 68, without ST/T changes 05/07/2017:  SB with SA; rate 53  Recent Labs: 03/26/2020: BUN 22; Creatinine, Ser 1.00; Hemoglobin 15.6; Potassium 5.0; Sodium 142  Recent Lipid Panel    Component Value Date/Time   CHOL 144 01/25/2019 0818   TRIG 79.0 01/25/2019 0818   HDL 55.00 01/25/2019 0818   CHOLHDL 3 01/25/2019 0818   VLDL 15.8 01/25/2019 0818   LDLCALC 74 01/25/2019 0818   CT Angio 03/15/20 AA Aorta 4.5 cm with motion artifact of the root large 6.5X6.9 AAA  Physical Exam:    VS:  BP (!) 150/84   Pulse 94   Ht 6\' 3"  (1.905 m)   Wt 212 lb 12.8 oz (96.5 kg)   SpO2 95%   BMI 26.60 kg/m     Wt Readings from Last 3 Encounters:  03/30/20 212 lb 12.8 oz (96.5 kg)  03/26/20 215 lb (97.5 kg)  03/16/20 214 lb (97.1 kg)    GEN: Well nourished, well developed in no acute distress HEENT: Normal NECK: No JVD; No carotid bruits LYMPHATICS: No lymphadenopathy CARDIAC: RRR, no murmurs, rubs, gallops RESPIRATORY:  Clear to auscultation without rales, wheezing or rhonchi  ABDOMEN: Soft, non-tender, non-distended abdominal bruit  noted MUSCULOSKELETAL:  No edema; No deformity  SKIN: Warm and dry NEUROLOGIC:  Alert and oriented x 3 PSYCHIATRIC:  Normal affect   ASSESSMENT:    1. Thoracic ascending aortic aneurysm ([place])   2. AAA (abdominal aortic aneurysm) without rupture ([place])   3. Aortic atherosclerosis ([place])   4. Preoperative cardiovascular examination    PLAN:    In order of problems listed above:  Preoperative Risk Assessment - The Revised Cardiac Risk Index = 1 suprainguinal vascular surgery which equates to 1=0.9% estimated risk of perioperative myocardial infarction, pulmonary edema, ventricular fibrillation, cardiac arrest, or complete heart block.  DASI 15.45 fMETS 4.46  - No further cardiac testing is recommended prior to surgery.  - The patient may proceed to surgery at acceptable risk.   - Our service is available as needed in the peri-operative period.    Asymptomatic thoracic/abdominal aortic aneurysm - Last at 4.5 cm, stable over one year - Last scan 03/15/20; will need CT/Angio/Chest Aorta 08/2020 - Will get echocardiogram to look for AV and other surgical indications; this does not necessarily need to be done prior surgery - Discussed 1st degree family member screening - Discussed not using Fluoroquinolones  Abdominal aortic aneurysm greater that 5.5 cm- Primary Prevention:  HTN, CAD, Smoking Cessation  -Amb BP WNL; will continue AMB BP monitoring for now - At next visit will recheck lipids and discuss statin - Patient does not smoke and has been smoke free for 13 years   Medication Adjustments/Labs and Tests Ordered: Current medicines are reviewed at length with the patient today.  Concerns regarding medicines are outlined above.  No orders of the defined types were placed in this encounter.  No orders of the defined types were placed in this encounter.   There are no Patient Instructions on file for this visit.   Signed, Werner Lean, MD  03/30/2020 2:48 PM    [place]

## 2020-04-04 ENCOUNTER — Ambulatory Visit: Payer: Medicare HMO | Admitting: Physical Medicine and Rehabilitation

## 2020-04-06 ENCOUNTER — Encounter: Payer: Self-pay | Admitting: Vascular Surgery

## 2020-04-06 ENCOUNTER — Ambulatory Visit: Payer: Medicare HMO | Admitting: Vascular Surgery

## 2020-04-06 ENCOUNTER — Other Ambulatory Visit: Payer: Self-pay

## 2020-04-06 VITALS — BP 149/75 | HR 77 | Temp 98.2°F | Resp 20 | Ht 75.0 in | Wt 212.0 lb

## 2020-04-06 DIAGNOSIS — I714 Abdominal aortic aneurysm, without rupture, unspecified: Secondary | ICD-10-CM

## 2020-04-06 NOTE — H&P (View-Only) (Signed)
Patient ID: Carlos Olson, male   DOB: March 26, 1941, 79 y.o.   MRN: [id number]  Reason for Consult: Post-op Follow-up   Referred by Ria Bush, MD  Subjective:     HPI:  Carlos Olson is a 79 y.o. male presents in follow-up to discuss surgical intervention.  He is with his wife today.  We have previously discussed open versus endovascular repair of his abdominal aortic aneurysm.  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  Continues on aspirin therapy daily he has been cleared by cardiology for surgery.  Past Medical History:  Diagnosis Date  . AAA (abdominal aortic aneurysm) ([place])   . Atherosclerosis of aortic arch ([place]) 09/25/2015   By CT scan   . CAD (coronary artery disease) 09/25/2015   Mild 3v by CT scan   . Cancer [facility])    prostate  . Colon polyps   . COPD (chronic obstructive pulmonary disease) ([place])   . Diverticulosis of colon   . Femoral bruit    bilateral, found by Dr. Jefm Bryant  . Heart murmur    child  . History of prostate cancer   . IBS (irritable bowel syndrome)    improved after retirement  . 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 Pelvic fracture ([place])   . Pneumonia    child  . Pulmonary nodules 09/25/2015   On screening CT scan, rpt 1 yr   . Shortness of breath    doe   Family History  Problem Relation Age of Onset  . Hypertension Mother   . Cancer Father        bone, prostate  . Diabetes Sister        x 40 years  . Cancer Brother        prostate   Past Surgical History:  Procedure Laterality Date  . ANTERIOR CERVICAL DECOMP/DISCECTOMY FUSION  07/29/2011   Procedure: ANTERIOR CERVICAL DECOMPRESSION/DISCECTOMY FUSION 1 LEVEL;  Surgeon: Jessy Oto, MD;  Location: [place];  Service: Orthopedics;  Laterality: N/A;  Anterior Cervical Discectomy and Fusion C3-4  . CATARACT EXTRACTION W/PHACO Right 07/10/2015   Procedure: CATARACT EXTRACTION PHACO AND  INTRAOCULAR LENS PLACEMENT (IOC);  Surgeon: Birder Robson, MD;  Location: [facility];  Service: Ophthalmology;  Laterality: Right;  [place] 00:58   . CATARACT EXTRACTION W/PHACO Left 07/24/2015   Procedure: CATARACT EXTRACTION PHACO AND INTRAOCULAR LENS PLACEMENT (IOC);  Surgeon: Birder Robson, MD;  Location: [facility];  Service: Ophthalmology;  Laterality: Left;  [place] 00:38   . COLONOSCOPY  12/2007   Medhoff, diverticulosis and polyp, rpt 5 yrs  . dental implants    . EMBOLIZATION Right 03/26/2020   Procedure: EMBOLIZATION;  Surgeon: Waynetta Sandy, MD;  Location: [place] [facility];  Service: Cardiovascular;  Laterality: Right;  . FORAMINOTOMY 2 LEVEL Left 10/2013   C4/5, C5/6 ([place])  . HERNIA REPAIR     right 05/1999, left 09/04/05  . JOINT REPLACEMENT Right    TKR  . KNEE ARTHROPLASTY Left 05/20/2017   Procedure: COMPUTER ASSISTED TOTAL KNEE ARTHROPLASTY;  Surgeon: Dereck Leep, MD;  Location: [facility];  Service: Orthopedics;  Laterality: Left;  . KNEE ARTHROSCOPY  09/1997   left  . POSTERIOR CERVICAL FUSION/FORAMINOTOMY N/A 11/22/2013   Procedure: LEFT C4-5 AND C5-6 FORAMINOTOMY;  Surgeon: Jessy Oto, MD;  Location: [place];  Service: Orthopedics;  Laterality: N/A;  . PROSTATECTOMY  08/1990  .  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 TONSILLECTOMY      Short Social History:  Social History   Tobacco Use  . Smoking status: Former Smoker    Packs/day: 2.00    Years: 52.00    Pack years: 104.00    Types: Cigarettes    Quit date: 05/15/2007    Years since quitting: 12.9  . Smokeless tobacco: Never Used  . Tobacco comment: quit 06/16/2007/encouraged to remain smoke free  Substance Use Topics  . Alcohol use: Yes    Alcohol/week: 14.0 standard drinks    Types: 14 Standard drinks or equivalent per week    Comment: 2 drinks a day    Allergies  Allergen Reactions  . Levaquin [Levofloxacin]     Aortic Aneurysm  . Sulfonamide Derivatives Itching  . Sulfasalazine Itching     Current Outpatient Medications  Medication Sig Dispense Refill  . acetaminophen (TYLENOL) 500 MG tablet Take 1,000 mg by mouth 3 (three) times daily as needed (back pain/headaches).     Marland Kitchen albuterol (VENTOLIN HFA) 108 (90 Base) MCG/ACT inhaler Inhale 2 puffs into the lungs every 6 (six) hours as needed for wheezing or shortness of breath. 1 Inhaler 3  . aspirin EC 81 MG tablet Take 1 tablet (81 mg total) by mouth daily.    . Fluticasone-Umeclidin-Vilant (TRELEGY ELLIPTA) 100-62.5-25 MCG/INH AEPB Inhale 1 puff into the lungs daily. 60 each 5  . Multiple Vitamin (MULTIVITAMIN WITH MINERALS) TABS tablet Take 1 tablet by mouth daily. Multivitamin for Men     No current facility-administered medications for this visit.    Review of Systems  Constitutional:  Constitutional negative. HENT: HENT negative.  Eyes: Eyes negative.  Respiratory: Respiratory negative.  Cardiovascular: Positive for leg swelling.  GI: Gastrointestinal negative.  Musculoskeletal: Positive for leg pain.  Skin: Skin negative.  Neurological: Neurological negative. Hematologic: Hematologic/lymphatic negative.  Psychiatric: Psychiatric negative.        Objective:  Objective   Vitals:   04/06/20 0951  BP: (!) 149/75  Pulse: 77  Resp: 20  Temp: 98.2 F (36.8 C)  SpO2: 95%  Weight: 212 lb (96.2 kg)  Height: 6\' 3"  (1.905 m)   Body mass index is 26.5 kg/m.  Physical Exam HENT:     Head: Normocephalic.     Nose:     Comments: Wearing a mask Eyes:     Pupils: Pupils are equal, round, and reactive to light.  Cardiovascular:     Pulses:          Radial pulses are 2+ on the right side and 2+ on the left side.       Femoral pulses are 3+ on the right side and 3+ on the left side.      Popliteal pulses are 3+ on the right side and 2+ on the left side.  Pulmonary:     Effort: Pulmonary effort is normal.  Abdominal:     General: Abdomen is flat.  Musculoskeletal:        General: Normal range of motion.   Skin:    General: Skin is warm.     Capillary Refill: Capillary refill takes less than 2 seconds.  Neurological:     General: No focal deficit present.     Mental Status: He is alert.  Psychiatric:        Mood and Affect: Mood normal.        Behavior: Behavior normal.        Thought Content: Thought content normal.  Judgment: Judgment normal.     Data: We again reviewed his CT scan together in the office.     Assessment/Plan:     79 year old male follows up after right hypogastric artery aneurysm coiling.  He has done very well from this.  We discussed his surgical options including endovascular versus open repair.  Given that endovascular repair would require both hypogastric arteries to be embolized and he would also lose the inferior mesenteric artery I have recommended open repair to prevent rupture which is likely approximately 20 %/year.  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  We will wait on any sort of repair of his right popliteal artery aneurysm at this time.  At the time of surgery we will make the determination for aortobifemoral bypass versus aorto right femoral and left common iliac artery versus jumping to the left common femoral artery as well.  I have discussed the operation in detail with he and his wife including the risk to his heart, kidneys possibly requiring dialysis, possible need for blood transfusion, need for at least 1 week in the hospital and likely 2 weeks of rehab afterwards.  He also has a risk of colonic ischemia given that the right hypogastric artery has been coiled and the IMA will likely be sacrificed at the time of surgery.  Also has a history of extensive prostate surgery which may make things more difficult he did not have radiation however.  He and his wife demonstrate good understanding we will get him scheduled today.    Waynetta Sandy MD [facility]  [facility]

## 2020-04-06 NOTE — Progress Notes (Signed)
Patient ID: Carlos Olson, male   DOB: 1940/09/20, 79 y.o.   MRN: [id number]  Reason for Consult: Post-op Follow-up   Referred by Ria Bush, MD  Subjective:     HPI:  Carlos Olson is a 79 y.o. male presents in follow-up to discuss surgical intervention.  He is with his wife today.  We have previously discussed open versus endovascular repair of his abdominal aortic aneurysm.  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  Continues on aspirin therapy daily he has been cleared by cardiology for surgery.  Past Medical History:  Diagnosis Date  . AAA (abdominal aortic aneurysm) ([place])   . Atherosclerosis of aortic arch ([place]) 09/25/2015   By CT scan   . CAD (coronary artery disease) 09/25/2015   Mild 3v by CT scan   . Cancer [facility])    prostate  . Colon polyps   . COPD (chronic obstructive pulmonary disease) ([place])   . Diverticulosis of colon   . Femoral bruit    bilateral, found by Dr. Jefm Bryant  . Heart murmur    child  . History of prostate cancer   . IBS (irritable bowel syndrome)    improved after retirement  . 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 Pelvic fracture ([place])   . Pneumonia    child  . Pulmonary nodules 09/25/2015   On screening CT scan, rpt 1 yr   . Shortness of breath    doe   Family History  Problem Relation Age of Onset  . Hypertension Mother   . Cancer Father        bone, prostate  . Diabetes Sister        x 40 years  . Cancer Brother        prostate   Past Surgical History:  Procedure Laterality Date  . ANTERIOR CERVICAL DECOMP/DISCECTOMY FUSION  07/29/2011   Procedure: ANTERIOR CERVICAL DECOMPRESSION/DISCECTOMY FUSION 1 LEVEL;  Surgeon: Jessy Oto, MD;  Location: [place];  Service: Orthopedics;  Laterality: N/A;  Anterior Cervical Discectomy and Fusion C3-4  . CATARACT EXTRACTION W/PHACO Right 07/10/2015   Procedure: CATARACT EXTRACTION PHACO AND  INTRAOCULAR LENS PLACEMENT (IOC);  Surgeon: Birder Robson, MD;  Location: [facility];  Service: Ophthalmology;  Laterality: Right;  [place] 00:58   . CATARACT EXTRACTION W/PHACO Left 07/24/2015   Procedure: CATARACT EXTRACTION PHACO AND INTRAOCULAR LENS PLACEMENT (IOC);  Surgeon: Birder Robson, MD;  Location: [facility];  Service: Ophthalmology;  Laterality: Left;  [place] 00:38   . COLONOSCOPY  12/2007   Medhoff, diverticulosis and polyp, rpt 5 yrs  . dental implants    . EMBOLIZATION Right 03/26/2020   Procedure: EMBOLIZATION;  Surgeon: Waynetta Sandy, MD;  Location: [place] [facility];  Service: Cardiovascular;  Laterality: Right;  . FORAMINOTOMY 2 LEVEL Left 10/2013   C4/5, C5/6 ([place])  . HERNIA REPAIR     right 05/1999, left 09/04/05  . JOINT REPLACEMENT Right    TKR  . KNEE ARTHROPLASTY Left 05/20/2017   Procedure: COMPUTER ASSISTED TOTAL KNEE ARTHROPLASTY;  Surgeon: Dereck Leep, MD;  Location: [facility];  Service: Orthopedics;  Laterality: Left;  . KNEE ARTHROSCOPY  09/1997   left  . POSTERIOR CERVICAL FUSION/FORAMINOTOMY N/A 11/22/2013   Procedure: LEFT C4-5 AND C5-6 FORAMINOTOMY;  Surgeon: Jessy Oto, MD;  Location: [place];  Service: Orthopedics;  Laterality: N/A;  . PROSTATECTOMY  08/1990  .  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 TONSILLECTOMY      Short Social History:  Social History   Tobacco Use  . Smoking status: Former Smoker    Packs/day: 2.00    Years: 52.00    Pack years: 104.00    Types: Cigarettes    Quit date: 05/15/2007    Years since quitting: 12.9  . Smokeless tobacco: Never Used  . Tobacco comment: quit 06/16/2007/encouraged to remain smoke free  Substance Use Topics  . Alcohol use: Yes    Alcohol/week: 14.0 standard drinks    Types: 14 Standard drinks or equivalent per week    Comment: 2 drinks a day    Allergies  Allergen Reactions  . Levaquin [Levofloxacin]     Aortic Aneurysm  . Sulfonamide Derivatives Itching  . Sulfasalazine Itching      Current Outpatient Medications  Medication Sig Dispense Refill  . acetaminophen (TYLENOL) 500 MG tablet Take 1,000 mg by mouth 3 (three) times daily as needed (back pain/headaches).     Marland Kitchen albuterol (VENTOLIN HFA) 108 (90 Base) MCG/ACT inhaler Inhale 2 puffs into the lungs every 6 (six) hours as needed for wheezing or shortness of breath. 1 Inhaler 3  . aspirin EC 81 MG tablet Take 1 tablet (81 mg total) by mouth daily.    . Fluticasone-Umeclidin-Vilant (TRELEGY ELLIPTA) 100-62.5-25 MCG/INH AEPB Inhale 1 puff into the lungs daily. 60 each 5  . Multiple Vitamin (MULTIVITAMIN WITH MINERALS) TABS tablet Take 1 tablet by mouth daily. Multivitamin for Men     No current facility-administered medications for this visit.    Review of Systems  Constitutional:  Constitutional negative. HENT: HENT negative.  Eyes: Eyes negative.  Respiratory: Respiratory negative.  Cardiovascular: Positive for leg swelling.  GI: Gastrointestinal negative.  Musculoskeletal: Positive for leg pain.  Skin: Skin negative.  Neurological: Neurological negative. Hematologic: Hematologic/lymphatic negative.  Psychiatric: Psychiatric negative.        Objective:  Objective   Vitals:   04/06/20 0951  BP: (!) 149/75  Pulse: 77  Resp: 20  Temp: 98.2 F (36.8 C)  SpO2: 95%  Weight: 212 lb (96.2 kg)  Height: 6\' 3"  (1.905 m)   Body mass index is 26.5 kg/m.  Physical Exam HENT:     Head: Normocephalic.     Nose:     Comments: Wearing a mask Eyes:     Pupils: Pupils are equal, round, and reactive to light.  Cardiovascular:     Pulses:          Radial pulses are 2+ on the right side and 2+ on the left side.       Femoral pulses are 3+ on the right side and 3+ on the left side.      Popliteal pulses are 3+ on the right side and 2+ on the left side.  Pulmonary:     Effort: Pulmonary effort is normal.  Abdominal:     General: Abdomen is flat.  Musculoskeletal:        General: Normal range of motion.   Skin:    General: Skin is warm.     Capillary Refill: Capillary refill takes less than 2 seconds.  Neurological:     General: No focal deficit present.     Mental Status: He is alert.  Psychiatric:        Mood and Affect: Mood normal.        Behavior: Behavior normal.        Thought Content: Thought content  normal.        Judgment: Judgment normal.     Data: We again reviewed his CT scan together in the office.     Assessment/Plan:     79 year old male follows up after right hypogastric artery aneurysm coiling.  He has done very well from this.  We discussed his surgical options including endovascular versus open repair.  Given that endovascular repair would require both hypogastric arteries to be embolized and he would also lose the inferior mesenteric artery I have recommended open repair to prevent rupture which is likely approximately 20 %/year.  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  We will wait on any sort of repair of his right popliteal artery aneurysm at this time.  At the time of surgery we will make the determination for aortobifemoral bypass versus aorto right femoral and left common iliac artery versus jumping to the left common femoral artery as well.  I have discussed the operation in detail with he and his wife including the risk to his heart, kidneys possibly requiring dialysis, possible need for blood transfusion, need for at least 1 week in the hospital and likely 2 weeks of rehab afterwards.  He also has a risk of colonic ischemia given that the right hypogastric artery has been coiled and the IMA will likely be sacrificed at the time of surgery.  Also has a history of extensive prostate surgery which may make things more difficult he did not have radiation however.  He and his wife demonstrate good understanding we will get him scheduled today.    Waynetta Sandy MD [facility]  [facility]

## 2020-04-07 ENCOUNTER — Other Ambulatory Visit: Payer: Self-pay | Admitting: Family Medicine

## 2020-04-07 DIAGNOSIS — Z8546 Personal history of malignant neoplasm of prostate: Secondary | ICD-10-CM

## 2020-04-07 DIAGNOSIS — I700 Atherosclerosis of aorta: Secondary | ICD-10-CM

## 2020-04-09 ENCOUNTER — Ambulatory Visit: Payer: Medicare HMO

## 2020-04-09 ENCOUNTER — Other Ambulatory Visit: Payer: Medicare HMO

## 2020-04-10 ENCOUNTER — Telehealth: Payer: Self-pay

## 2020-04-10 DIAGNOSIS — R918 Other nonspecific abnormal finding of lung field: Secondary | ICD-10-CM

## 2020-04-10 NOTE — Progress Notes (Signed)
[id number] [address], [address] AT [facility] OF [address] [place] [address] Phone: [phone] Fax: [phone]      Your procedure is scheduled on Tuesday October 19th.  Report to [facility] Main Entrance "A" at 5:30 A.M., and check in at the Admitting office.  Call this number if you have problems the morning of surgery:  [phone]  Call [phone] if you have any questions prior to your surgery date Monday-Friday 8am-4pm    Remember:  Do not eat or drink after midnight the night before your surgery   Take these medicines the morning of surgery  Fluticasone-Umeclidin-Vilant (TRELEGY ELLIPTA) 100-62.5-25 MCG/INH AEPB   IF NEEDED  acetaminophen (TYLENOL) 500 MG tablet as needed for pain  albuterol (VENTOLIN HFA) 108 (90 Base) MCG/ACT inhaler as needed for shortness of breath or wheezing   Please reach out to your surgeon regarding Aspirin instructions if not already addressed.  As of today, STOP taking any Aspirin (unless otherwise instructed by your surgeon) Aleve, Naproxen, Ibuprofen, Motrin, Advil, Goody's, BC's, all herbal medications, fish oil, and all vitamins.                      Do not wear jewelry            Do not wear lotions, powders, colognes, or deodorant.            Do not shave 48 hours prior to surgery.  Men may shave face and neck.            Do not bring valuables to the hospital.            [facility] is not responsible for any belongings or valuables.  Do NOT Smoke (Tobacco/Vaping) or drink Alcohol 24 hours prior to your procedure If you use a CPAP at night, you may bring all equipment for your overnight stay.   Contacts, glasses, dentures or bridgework may not be worn into surgery.      For patients admitted to the hospital, discharge time will be determined by your treatment team.   Patients discharged the day of surgery will not be allowed to drive home, and someone needs to stay  with them for 24 hours.    Special instructions:   [place]- Preparing For Surgery  Before surgery, you can play an important role. Because skin is not sterile, your skin needs to be as free of germs as possible. You can reduce the number of germs on your skin by washing with CHG (chlorahexidine gluconate) Soap before surgery.  CHG is an antiseptic cleaner which kills germs and bonds with the skin to continue killing germs even after washing.    Oral Hygiene is also important to reduce your risk of infection.  Remember - BRUSH YOUR TEETH THE MORNING OF SURGERY WITH YOUR REGULAR TOOTHPASTE  Please do not use if you have an allergy to CHG or antibacterial soaps. If your skin becomes reddened/irritated stop using the CHG.  Do not shave (including legs and underarms) for at least 48 hours prior to first CHG shower. It is OK to shave your face.  Please follow these instructions carefully.   1. Shower the NIGHT BEFORE SURGERY and the MORNING OF SURGERY with CHG Soap.   2. If you chose to wash your hair, wash your hair first as usual with your normal shampoo.  3. After you shampoo, rinse your hair  and body thoroughly to remove the shampoo.  4. Use CHG as you would any other liquid soap. You can apply CHG directly to the skin and wash gently with a scrungie or a clean washcloth.   5. Apply the CHG Soap to your body ONLY FROM THE NECK DOWN.  Do not use on open wounds or open sores. Avoid contact with your eyes, ears, mouth and genitals (private parts). Wash Face and genitals (private parts)  with your normal soap.   6. Wash thoroughly, paying special attention to the area where your surgery will be performed.  7. Thoroughly rinse your body with warm water from the neck down.  8. DO NOT shower/wash with your normal soap after using and rinsing off the CHG Soap.  9. Pat yourself dry with a CLEAN TOWEL.  10. Wear CLEAN PAJAMAS to bed the night before surgery  11. Place CLEAN SHEETS on your  bed the night of your first shower and DO NOT SLEEP WITH PETS.   Day of Surgery: Wear Clean/Comfortable clothing the morning of surgery Do not apply any deodorants/lotions.   Remember to brush your teeth WITH YOUR REGULAR TOOTHPASTE.   Please read over the following fact sheets that you were given.

## 2020-04-10 NOTE — Telephone Encounter (Signed)
Spoke with patient.  Reviewed recent studies and planned interventions. Appreciate [facility] care. Briefly discussed new pulm nodule - will need further evaluation by mid 05/2020 once vascular issues taken care of.

## 2020-04-10 NOTE — Telephone Encounter (Signed)
Pt is asking to speak to Dr Danise Mina. Would not elaborate. Would rather not speak to a nurse/CMA.

## 2020-04-11 ENCOUNTER — Encounter (HOSPITAL_COMMUNITY): Payer: Self-pay

## 2020-04-11 ENCOUNTER — Other Ambulatory Visit: Payer: Self-pay

## 2020-04-11 ENCOUNTER — Encounter (HOSPITAL_COMMUNITY)
Admission: RE | Admit: 2020-04-11 | Discharge: 2020-04-11 | Disposition: A | Discharge status: Home / Self Care | Payer: Medicare HMO | Source: Ambulatory Visit | Attending: Vascular Surgery | Admitting: Vascular Surgery

## 2020-04-11 DIAGNOSIS — Z01812 Encounter for preprocedural laboratory examination: Secondary | ICD-10-CM | POA: Insufficient documentation

## 2020-04-11 LAB — COMPREHENSIVE METABOLIC PANEL
ALT: 15 U/L (ref 0–44)
AST: 20 U/L (ref 15–41)
Albumin: 3.7 g/dL (ref 3.5–5.0)
Alkaline Phosphatase: 65 U/L (ref 38–126)
Anion gap: 10 (ref 5–15)
BUN: 14 mg/dL (ref 8–23)
CO2: 25 mmol/L (ref 22–32)
Calcium: 9.7 mg/dL (ref 8.9–10.3)
Chloride: 104 mmol/L (ref 98–111)
Creatinine, Ser: 0.92 mg/dL (ref 0.61–1.24)
GFR, Estimated: >60 mL/min (ref >60)
Glucose, Bld: 91 mg/dL (ref 70–99)
Potassium: 4.3 mmol/L (ref 3.5–5.1)
Sodium: 139 mmol/L (ref 135–145)
Total Bilirubin: 1 mg/dL (ref 0.3–1.2)
Total Protein: 6.8 g/dL (ref 6.5–8.1)

## 2020-04-11 LAB — CBC
HCT: 42.3 % (ref 39.0–52.0)
Hemoglobin: 13.9 g/dL (ref 13.0–17.0)
MCH: 34 pg (ref 26.0–34.0)
MCHC: 32.9 g/dL (ref 30.0–36.0)
MCV: 103.4 fL — ABNORMAL HIGH (ref 80.0–100.0)
Platelets: 261 10*3/uL (ref 150–400)
RBC: 4.09 MIL/uL — ABNORMAL LOW (ref 4.22–5.81)
RDW: 12.8 % (ref 11.5–15.5)
WBC: 5 10*3/uL (ref 4.0–10.5)
nRBC: 0 % (ref 0.0–0.2)

## 2020-04-11 LAB — URINALYSIS, ROUTINE W REFLEX MICROSCOPIC
Bilirubin Urine: NEGATIVE
Glucose, UA: NEGATIVE mg/dL
Hgb urine dipstick: NEGATIVE
Ketones, ur: NEGATIVE mg/dL
Leukocytes,Ua: NEGATIVE
Nitrite: NEGATIVE
Protein, ur: NEGATIVE mg/dL
Specific Gravity, Urine: 1.005 (ref 1.005–1.030)
pH: 6 (ref 5.0–8.0)

## 2020-04-11 LAB — PROTIME-INR
INR: 1 (ref 0.8–1.2)
Prothrombin Time: 12.8 seconds (ref 11.4–15.2)

## 2020-04-11 LAB — SURGICAL PCR SCREEN
MRSA, PCR: NEGATIVE
Staphylococcus aureus: NEGATIVE

## 2020-04-11 LAB — APTT: aPTT: 29 seconds (ref 24–36)

## 2020-04-11 NOTE — Progress Notes (Signed)
[id number] [address], [address] AT [employer] OF [address] [place] [address] Phone: [phone] Fax: [phone]      Your procedure is scheduled on October 19  Report to [facility] Main Entrance "A" at 0530 A.M., and check in at the Admitting office.  Call this number if you have problems the morning of surgery:  [phone]  Call [phone] if you have any questions prior to your surgery date Monday-Friday 8am-4pm    Remember:  Do not eat  Or drink after midnight the night before your surgery     Take these medicines the morning of surgery with A SIP OF WATER  acetaminophen (TYLENOL) if needed albuterol (VENTOLIN HFA)  If needed, Please bring all inhalers with you the day of surgery.  Fluticasone-Umeclidin-Vilant (TRELEGY ELLIPTA)   Follow your surgeon's instructions on when to stop Aspirin.  If no instructions were given by your surgeon then you will need to call the office to get those instructions.    As of today, STOP taking any Aleve, Naproxen, Ibuprofen, Motrin, Advil, Goody's, BC's, all herbal medications, fish oil, and all vitamins.                      Do not wear jewelry            Do not wear lotions, powders, colognes, or deodorant.            Men may shave face and neck.            Do not bring valuables to the hospital.            [facility] is not responsible for any belongings or valuables.  Do NOT Smoke (Tobacco/Vaping) or drink Alcohol 24 hours prior to your procedure If you use a CPAP at night, you may bring all equipment for your overnight stay.   Contacts, glasses, dentures or bridgework may not be worn into surgery.      For patients admitted to the hospital, discharge time will be determined by your treatment team.   Patients discharged the day of surgery will not be allowed to drive home, and someone needs to stay with them for 24 hours.    Special instructions:   [facility]-  Preparing For Surgery  Before surgery, you can play an important role. Because skin is not sterile, your skin needs to be as free of germs as possible. You can reduce the number of germs on your skin by washing with CHG (chlorahexidine gluconate) Soap before surgery.  CHG is an antiseptic cleaner which kills germs and bonds with the skin to continue killing germs even after washing.    Oral Hygiene is also important to reduce your risk of infection.  Remember - BRUSH YOUR TEETH THE MORNING OF SURGERY WITH YOUR REGULAR TOOTHPASTE  Please do not use if you have an allergy to CHG or antibacterial soaps. If your skin becomes reddened/irritated stop using the CHG.  Do not shave (including legs and underarms) for at least 48 hours prior to first CHG shower. It is OK to shave your face.  Please follow these instructions carefully.   1. Shower the NIGHT BEFORE SURGERY and the MORNING OF SURGERY with CHG Soap.   2. If you chose to wash your hair, wash your hair first as usual with your normal shampoo.  3. After you shampoo, rinse your hair and body thoroughly  to remove the shampoo.  4. Use CHG as you would any other liquid soap. You can apply CHG directly to the skin and wash gently with a scrungie or a clean washcloth.   5. Apply the CHG Soap to your body ONLY FROM THE NECK DOWN.  Do not use on open wounds or open sores. Avoid contact with your eyes, ears, mouth and genitals (private parts). Wash Face and genitals (private parts)  with your normal soap.   6. Wash thoroughly, paying special attention to the area where your surgery will be performed.  7. Thoroughly rinse your body with warm water from the neck down.  8. DO NOT shower/wash with your normal soap after using and rinsing off the CHG Soap.  9. Pat yourself dry with a CLEAN TOWEL.  10. Wear CLEAN PAJAMAS to bed the night before surgery  11. Place CLEAN SHEETS on your bed the night of your first shower and DO NOT SLEEP WITH  PETS.   Day of Surgery: Wear Clean/Comfortable clothing the morning of surgery Do not apply any deodorants/lotions.   Remember to brush your teeth WITH YOUR REGULAR TOOTHPASTE.   Please read over the following fact sheets that you were given.

## 2020-04-11 NOTE — Progress Notes (Signed)
PCP - Philip Aspen Cardiologist - Chandrasekhar    Chest x-ray - denies EKG - 03/30/20 Stress Test - denies ECHO - 04/13/20 pending  Cardiac Cath - denies  Aspirin Instructions: continue aspirin  COVID TEST- Monday 04/16/20   Anesthesia review: yes, pending ECHO results   Patient denies shortness of breath, fever, cough and chest pain at PAT appointment   All instructions explained to the patient, with a verbal understanding of the material. Patient agrees to go over the instructions while at home for a better understanding. Patient also instructed to self quarantine after being tested for COVID-19. The opportunity to ask questions was provided.

## 2020-04-12 NOTE — Progress Notes (Signed)
Anesthesia Chart Review:  Pt had preop cardiology evaluation by Dr. Gasper Sells on 03/30/20. Per note: Preoperative Risk Assessment - The Revised Cardiac Risk Index = 1 suprainguinal vascular surgery which equates to 1=0.9% estimated risk of perioperative myocardial infarction, pulmonary edema, ventricular fibrillation, cardiac arrest, or complete heart block.  DASI 15.45 fMETS 4.46  - No further cardiac testing is recommended prior to surgery.  - The patient may proceed to surgery at acceptable risk.   - Our service is available as needed in the peri-operative period.    Asymptomatic thoracic/abdominal aortic aneurysm - Last at 4.5 cm, stable over one year - Last scan 03/15/20; will need CT/Angio/Chest Aorta 08/2020 - Will get echocardiogram to look for AV and other surgical indications; this does not necessarily need to be done prior surgery - Discussed 1st degree family member screening - Discussed not using Fluoroquinolones  Preop labs reviewed, unremarkable.  EKG 03/30/20: Sinus rhythm. Rate 68.  TTE 04/13/20: 1. Left ventricular ejection fraction, by estimation, is 50%. The left  ventricle has mildly decreased function. The left ventricle demonstrates  global hypokinesis. Left ventricular diastolic parameters are consistent  with Grade I diastolic dysfunction  (impaired relaxation).  2. Right ventricular systolic function is normal. The right ventricular  size is normal. There is normal pulmonary artery systolic pressure. The  estimated right ventricular systolic pressure is 01.0 mmHg.  3. Left atrial size was moderately dilated.  4. Right atrial size was mildly dilated.  5. The mitral valve is normal in structure. No evidence of mitral valve  regurgitation. No evidence of mitral stenosis.  6. The aortic valve is tricuspid. Aortic valve regurgitation is trivial.  Mild aortic valve sclerosis is present, with no evidence of aortic valve  stenosis.  7. The inferior vena  cava is normal in size with greater than 50%  respiratory variability, suggesting right atrial pressure of 3 mmHg.    Wynonia Musty [facility] Short Stay Center/Anesthesiology Phone [phone] 04/13/2020 3:10 PM

## 2020-04-13 ENCOUNTER — Ambulatory Visit (HOSPITAL_COMMUNITY): Payer: Medicare HMO | Attending: Cardiology

## 2020-04-13 ENCOUNTER — Other Ambulatory Visit: Payer: Self-pay

## 2020-04-13 DIAGNOSIS — I714 Abdominal aortic aneurysm, without rupture, unspecified: Secondary | ICD-10-CM

## 2020-04-13 DIAGNOSIS — I7121 Aneurysm of the ascending aorta, without rupture: Secondary | ICD-10-CM

## 2020-04-13 DIAGNOSIS — I712 Thoracic aortic aneurysm, without rupture: Secondary | ICD-10-CM | POA: Insufficient documentation

## 2020-04-13 DIAGNOSIS — Z01810 Encounter for preprocedural cardiovascular examination: Secondary | ICD-10-CM | POA: Diagnosis not present

## 2020-04-13 DIAGNOSIS — I700 Atherosclerosis of aorta: Secondary | ICD-10-CM | POA: Insufficient documentation

## 2020-04-13 LAB — ECHOCARDIOGRAM COMPLETE
Area-P 1/2: 2.4 cm2
S' Lateral: 3.5 cm

## 2020-04-13 NOTE — Anesthesia Preprocedure Evaluation (Addendum)
Anesthesia Evaluation  Patient identified by MRN, date of birth, ID band Patient awake    Reviewed: Patient's Chart, lab work & pertinent test results  Airway Mallampati: II  TM Distance: >3 FB Neck ROM: Full    Dental  (+) Upper Dentures, Lower Dentures   Pulmonary COPD, former smoker,    Pulmonary exam normal        Cardiovascular + CAD   Rhythm:Regular Rate:Normal  AAA   Neuro/Psych negative neurological ROS  negative psych ROS   GI/Hepatic Neg liver ROS, Colon polyps    Endo/Other  negative endocrine ROS  Renal/GU negative Renal ROS   Prostate Ca    Musculoskeletal  (+) Arthritis , Osteoarthritis,    Abdominal (+)  Abdomen: soft. Bowel sounds: normal.  Peds  Hematology negative hematology ROS (+)   Anesthesia Other Findings   Reproductive/Obstetrics                           Anesthesia Physical Anesthesia Plan  ASA: III  Anesthesia Plan: General   Post-op Pain Management:    Induction: Intravenous  PONV Risk Score and Plan: 2 and Ondansetron, Dexamethasone and Treatment may vary due to age or medical condition  Airway Management Planned: Mask and Oral ETT  Additional Equipment: Arterial line  Intra-op Plan:   Post-operative Plan: Extubation in OR  Informed Consent: I have reviewed the patients History and Physical, chart, labs and discussed the procedure including the risks, benefits and alternatives for the proposed anesthesia with the patient or authorized representative who has indicated his/her understanding and acceptance.       Plan Discussed with: CRNA and Surgeon  Anesthesia Plan Comments: (PAT note by Karoline Caldwell, PA-C: Pt had preop cardiology evaluation by Dr. Gasper Sells on 03/30/20. Per note: Preoperative Risk Assessment - The Revised Cardiac Risk Index = 1 suprainguinal vascular surgery which equates to 1=0.9% estimated risk of perioperative  myocardial infarction, pulmonary edema, ventricular fibrillation, cardiac arrest, or complete heart block.  DASI 15.45 fMETS 4.46  - No further cardiac testing is recommended prior to surgery.  - The patient may proceed to surgery at acceptable risk.   - Our service is available as needed in the peri-operative period.    Asymptomatic thoracic/abdominal aortic aneurysm - Last at 4.5 cm, stable over one year - Last scan 03/15/20; will need CT/Angio/Chest Aorta 08/2020 - Will get echocardiogram to look for AV and other surgical indications; this does not necessarily need to be done prior surgery - Discussed 1st degree family member screening - Discussed not using Fluoroquinolones  Preop labs reviewed, unremarkable.  EKG 03/30/20: Sinus rhythm. Rate 68.  TTE 04/13/20: 1. Left ventricular ejection fraction, by estimation, is 50%. The left  ventricle has mildly decreased function. The left ventricle demonstrates  global hypokinesis. Left ventricular diastolic parameters are consistent  with Grade I diastolic dysfunction  (impaired relaxation).  2. Right ventricular systolic function is normal. The right ventricular  size is normal. There is normal pulmonary artery systolic pressure. The  estimated right ventricular systolic pressure is 75.1 mmHg.  3. Left atrial size was moderately dilated.  4. Right atrial size was mildly dilated.  5. The mitral valve is normal in structure. No evidence of mitral valve  regurgitation. No evidence of mitral stenosis.  6. The aortic valve is tricuspid. Aortic valve regurgitation is trivial.  Mild aortic valve sclerosis is present, with no evidence of aortic valve  stenosis.  7. The inferior vena  cava is normal in size with greater than 50%  respiratory variability, suggesting right atrial pressure of 3 mmHg.   )      Anesthesia Quick Evaluation

## 2020-04-16 ENCOUNTER — Other Ambulatory Visit (HOSPITAL_COMMUNITY)
Admission: RE | Admit: 2020-04-16 | Discharge: 2020-04-16 | Disposition: A | Discharge status: Home / Self Care | Payer: Medicare HMO | Source: Ambulatory Visit | Attending: Vascular Surgery | Admitting: Vascular Surgery

## 2020-04-16 ENCOUNTER — Other Ambulatory Visit (HOSPITAL_COMMUNITY): Payer: Medicare HMO

## 2020-04-16 ENCOUNTER — Encounter: Payer: Medicare HMO | Admitting: Family Medicine

## 2020-04-16 ENCOUNTER — Telehealth: Payer: Self-pay

## 2020-04-16 DIAGNOSIS — Z20822 Contact with and (suspected) exposure to covid-19: Secondary | ICD-10-CM | POA: Insufficient documentation

## 2020-04-16 DIAGNOSIS — Z01812 Encounter for preprocedural laboratory examination: Secondary | ICD-10-CM | POA: Insufficient documentation

## 2020-04-16 LAB — SARS CORONAVIRUS 2 (TAT 6-24 HRS): SARS Coronavirus 2: NEGATIVE

## 2020-04-16 NOTE — Telephone Encounter (Signed)
The patient has been notified of the result and verbalized understanding.  All questions (if any) were answered. Wilma Flavin, RN 04/16/2020 8:51 AM

## 2020-04-16 NOTE — Telephone Encounter (Signed)
-----   Message from Werner Lean, MD sent at 04/15/2020 12:04 PM EDT ----- Results: Root OK, EF WNL.  Unable to see ascending aorta in study. Plan: Still OK for surgery, no change in mgmt.  Werner Lean, MD

## 2020-04-17 ENCOUNTER — Ambulatory Visit: Payer: Medicare HMO

## 2020-04-17 ENCOUNTER — Inpatient Hospital Stay (HOSPITAL_COMMUNITY): Payer: Medicare HMO | Admitting: Anesthesiology

## 2020-04-17 ENCOUNTER — Inpatient Hospital Stay (HOSPITAL_COMMUNITY)
Admission: RE | Admit: 2020-04-17 | Discharge: 2020-05-30 | DRG: 003 | Disposition: A | Discharge status: Rehabilitation Facility | Payer: Medicare HMO | Attending: Vascular Surgery | Admitting: Vascular Surgery

## 2020-04-17 ENCOUNTER — Other Ambulatory Visit: Payer: Self-pay

## 2020-04-17 ENCOUNTER — Encounter (HOSPITAL_COMMUNITY)
Admission: RE | Disposition: A | Discharge status: Rehabilitation Facility | Payer: Self-pay | Source: Home / Self Care | Attending: Vascular Surgery

## 2020-04-17 ENCOUNTER — Inpatient Hospital Stay (HOSPITAL_COMMUNITY): Payer: Medicare HMO

## 2020-04-17 ENCOUNTER — Inpatient Hospital Stay (HOSPITAL_COMMUNITY): Payer: Medicare HMO | Admitting: Physician Assistant

## 2020-04-17 ENCOUNTER — Encounter (HOSPITAL_COMMUNITY): Payer: Self-pay | Admitting: Vascular Surgery

## 2020-04-17 DIAGNOSIS — J810 Acute pulmonary edema: Secondary | ICD-10-CM | POA: Diagnosis not present

## 2020-04-17 DIAGNOSIS — R918 Other nonspecific abnormal finding of lung field: Secondary | ICD-10-CM | POA: Diagnosis not present

## 2020-04-17 DIAGNOSIS — I724 Aneurysm of artery of lower extremity: Secondary | ICD-10-CM | POA: Diagnosis present

## 2020-04-17 DIAGNOSIS — K559 Vascular disorder of intestine, unspecified: Secondary | ICD-10-CM | POA: Diagnosis present

## 2020-04-17 DIAGNOSIS — E878 Other disorders of electrolyte and fluid balance, not elsewhere classified: Secondary | ICD-10-CM | POA: Diagnosis not present

## 2020-04-17 DIAGNOSIS — R0902 Hypoxemia: Secondary | ICD-10-CM

## 2020-04-17 DIAGNOSIS — E874 Mixed disorder of acid-base balance: Secondary | ICD-10-CM | POA: Diagnosis not present

## 2020-04-17 DIAGNOSIS — Y95 Nosocomial condition: Secondary | ICD-10-CM | POA: Diagnosis not present

## 2020-04-17 DIAGNOSIS — Z882 Allergy status to sulfonamides status: Secondary | ICD-10-CM

## 2020-04-17 DIAGNOSIS — Z87891 Personal history of nicotine dependence: Secondary | ICD-10-CM

## 2020-04-17 DIAGNOSIS — R911 Solitary pulmonary nodule: Secondary | ICD-10-CM | POA: Diagnosis present

## 2020-04-17 DIAGNOSIS — Z79899 Other long term (current) drug therapy: Secondary | ICD-10-CM

## 2020-04-17 DIAGNOSIS — J189 Pneumonia, unspecified organism: Secondary | ICD-10-CM | POA: Diagnosis not present

## 2020-04-17 DIAGNOSIS — I714 Abdominal aortic aneurysm, without rupture, unspecified: Secondary | ICD-10-CM | POA: Diagnosis present

## 2020-04-17 DIAGNOSIS — J9621 Acute and chronic respiratory failure with hypoxia: Secondary | ICD-10-CM | POA: Diagnosis not present

## 2020-04-17 DIAGNOSIS — F05 Delirium due to known physiological condition: Secondary | ICD-10-CM | POA: Diagnosis not present

## 2020-04-17 DIAGNOSIS — Z8042 Family history of malignant neoplasm of prostate: Secondary | ICD-10-CM

## 2020-04-17 DIAGNOSIS — Z9889 Other specified postprocedural states: Secondary | ICD-10-CM

## 2020-04-17 DIAGNOSIS — G928 Other toxic encephalopathy: Secondary | ICD-10-CM | POA: Diagnosis present

## 2020-04-17 DIAGNOSIS — N280 Ischemia and infarction of kidney: Secondary | ICD-10-CM | POA: Diagnosis present

## 2020-04-17 DIAGNOSIS — Z01818 Encounter for other preprocedural examination: Secondary | ICD-10-CM

## 2020-04-17 DIAGNOSIS — R0603 Acute respiratory distress: Secondary | ICD-10-CM

## 2020-04-17 DIAGNOSIS — Z7982 Long term (current) use of aspirin: Secondary | ICD-10-CM

## 2020-04-17 DIAGNOSIS — N321 Vesicointestinal fistula: Secondary | ICD-10-CM | POA: Diagnosis not present

## 2020-04-17 DIAGNOSIS — J9601 Acute respiratory failure with hypoxia: Secondary | ICD-10-CM | POA: Diagnosis not present

## 2020-04-17 DIAGNOSIS — R1310 Dysphagia, unspecified: Secondary | ICD-10-CM | POA: Diagnosis not present

## 2020-04-17 DIAGNOSIS — K567 Ileus, unspecified: Secondary | ICD-10-CM | POA: Diagnosis not present

## 2020-04-17 DIAGNOSIS — Z978 Presence of other specified devices: Secondary | ICD-10-CM

## 2020-04-17 DIAGNOSIS — J690 Pneumonitis due to inhalation of food and vomit: Secondary | ICD-10-CM | POA: Diagnosis not present

## 2020-04-17 DIAGNOSIS — N171 Acute kidney failure with acute cortical necrosis: Secondary | ICD-10-CM | POA: Diagnosis not present

## 2020-04-17 DIAGNOSIS — I2510 Atherosclerotic heart disease of native coronary artery without angina pectoris: Secondary | ICD-10-CM | POA: Diagnosis not present

## 2020-04-17 DIAGNOSIS — Z0189 Encounter for other specified special examinations: Secondary | ICD-10-CM

## 2020-04-17 DIAGNOSIS — J440 Chronic obstructive pulmonary disease with acute lower respiratory infection: Secondary | ICD-10-CM | POA: Diagnosis present

## 2020-04-17 DIAGNOSIS — R82998 Other abnormal findings in urine: Secondary | ICD-10-CM | POA: Diagnosis not present

## 2020-04-17 DIAGNOSIS — I723 Aneurysm of iliac artery: Secondary | ICD-10-CM | POA: Diagnosis present

## 2020-04-17 DIAGNOSIS — Z881 Allergy status to other antibiotic agents status: Secondary | ICD-10-CM

## 2020-04-17 DIAGNOSIS — J15211 Pneumonia due to Methicillin susceptible Staphylococcus aureus: Secondary | ICD-10-CM | POA: Diagnosis not present

## 2020-04-17 DIAGNOSIS — I5043 Acute on chronic combined systolic (congestive) and diastolic (congestive) heart failure: Secondary | ICD-10-CM | POA: Diagnosis present

## 2020-04-17 DIAGNOSIS — Z538 Procedure and treatment not carried out for other reasons: Secondary | ICD-10-CM

## 2020-04-17 DIAGNOSIS — Z86010 Personal history of colonic polyps: Secondary | ICD-10-CM

## 2020-04-17 DIAGNOSIS — J90 Pleural effusion, not elsewhere classified: Secondary | ICD-10-CM | POA: Diagnosis not present

## 2020-04-17 DIAGNOSIS — I482 Chronic atrial fibrillation, unspecified: Secondary | ICD-10-CM | POA: Diagnosis not present

## 2020-04-17 DIAGNOSIS — X58XXXA Exposure to other specified factors, initial encounter: Secondary | ICD-10-CM | POA: Diagnosis not present

## 2020-04-17 DIAGNOSIS — Z6823 Body mass index (BMI) 23.0-23.9, adult: Secondary | ICD-10-CM

## 2020-04-17 DIAGNOSIS — M1990 Unspecified osteoarthritis, unspecified site: Secondary | ICD-10-CM | POA: Diagnosis present

## 2020-04-17 DIAGNOSIS — E785 Hyperlipidemia, unspecified: Secondary | ICD-10-CM | POA: Diagnosis present

## 2020-04-17 DIAGNOSIS — R011 Cardiac murmur, unspecified: Secondary | ICD-10-CM | POA: Diagnosis present

## 2020-04-17 DIAGNOSIS — J449 Chronic obstructive pulmonary disease, unspecified: Secondary | ICD-10-CM

## 2020-04-17 DIAGNOSIS — I700 Atherosclerosis of aorta: Secondary | ICD-10-CM | POA: Diagnosis present

## 2020-04-17 DIAGNOSIS — T40605A Adverse effect of unspecified narcotics, initial encounter: Secondary | ICD-10-CM | POA: Diagnosis not present

## 2020-04-17 DIAGNOSIS — Z96653 Presence of artificial knee joint, bilateral: Secondary | ICD-10-CM | POA: Diagnosis present

## 2020-04-17 DIAGNOSIS — R069 Unspecified abnormalities of breathing: Secondary | ICD-10-CM

## 2020-04-17 DIAGNOSIS — Z9841 Cataract extraction status, right eye: Secondary | ICD-10-CM

## 2020-04-17 DIAGNOSIS — M6289 Other specified disorders of muscle: Secondary | ICD-10-CM | POA: Diagnosis not present

## 2020-04-17 DIAGNOSIS — G9341 Metabolic encephalopathy: Secondary | ICD-10-CM | POA: Diagnosis not present

## 2020-04-17 DIAGNOSIS — R5381 Other malaise: Secondary | ICD-10-CM | POA: Diagnosis not present

## 2020-04-17 DIAGNOSIS — Z9079 Acquired absence of other genital organ(s): Secondary | ICD-10-CM

## 2020-04-17 DIAGNOSIS — N390 Urinary tract infection, site not specified: Secondary | ICD-10-CM | POA: Diagnosis not present

## 2020-04-17 DIAGNOSIS — R0600 Dyspnea, unspecified: Secondary | ICD-10-CM | POA: Diagnosis not present

## 2020-04-17 DIAGNOSIS — I712 Thoracic aortic aneurysm, without rupture: Secondary | ICD-10-CM | POA: Diagnosis not present

## 2020-04-17 DIAGNOSIS — I110 Hypertensive heart disease with heart failure: Secondary | ICD-10-CM | POA: Diagnosis present

## 2020-04-17 DIAGNOSIS — J811 Chronic pulmonary edema: Secondary | ICD-10-CM | POA: Diagnosis not present

## 2020-04-17 DIAGNOSIS — G319 Degenerative disease of nervous system, unspecified: Secondary | ICD-10-CM | POA: Diagnosis not present

## 2020-04-17 DIAGNOSIS — E7220 Disorder of urea cycle metabolism, unspecified: Secondary | ICD-10-CM | POA: Diagnosis present

## 2020-04-17 DIAGNOSIS — J439 Emphysema, unspecified: Secondary | ICD-10-CM | POA: Diagnosis not present

## 2020-04-17 DIAGNOSIS — N134 Hydroureter: Secondary | ICD-10-CM | POA: Diagnosis not present

## 2020-04-17 DIAGNOSIS — E43 Unspecified severe protein-calorie malnutrition: Secondary | ICD-10-CM | POA: Diagnosis not present

## 2020-04-17 DIAGNOSIS — D649 Anemia, unspecified: Secondary | ICD-10-CM | POA: Diagnosis not present

## 2020-04-17 DIAGNOSIS — T502X5A Adverse effect of carbonic-anhydrase inhibitors, benzothiadiazides and other diuretics, initial encounter: Secondary | ICD-10-CM | POA: Diagnosis not present

## 2020-04-17 DIAGNOSIS — Z930 Tracheostomy status: Secondary | ICD-10-CM | POA: Diagnosis not present

## 2020-04-17 DIAGNOSIS — Z8546 Personal history of malignant neoplasm of prostate: Secondary | ICD-10-CM

## 2020-04-17 DIAGNOSIS — E870 Hyperosmolality and hypernatremia: Secondary | ICD-10-CM | POA: Diagnosis not present

## 2020-04-17 DIAGNOSIS — N179 Acute kidney failure, unspecified: Secondary | ICD-10-CM | POA: Diagnosis not present

## 2020-04-17 DIAGNOSIS — A419 Sepsis, unspecified organism: Secondary | ICD-10-CM | POA: Diagnosis not present

## 2020-04-17 DIAGNOSIS — J9811 Atelectasis: Secondary | ICD-10-CM | POA: Diagnosis not present

## 2020-04-17 DIAGNOSIS — G931 Anoxic brain damage, not elsewhere classified: Secondary | ICD-10-CM | POA: Diagnosis not present

## 2020-04-17 DIAGNOSIS — I4819 Other persistent atrial fibrillation: Secondary | ICD-10-CM | POA: Diagnosis present

## 2020-04-17 DIAGNOSIS — Z9842 Cataract extraction status, left eye: Secondary | ICD-10-CM

## 2020-04-17 DIAGNOSIS — R1312 Dysphagia, oropharyngeal phase: Secondary | ICD-10-CM | POA: Diagnosis not present

## 2020-04-17 DIAGNOSIS — D500 Iron deficiency anemia secondary to blood loss (chronic): Secondary | ICD-10-CM | POA: Diagnosis not present

## 2020-04-17 DIAGNOSIS — N170 Acute kidney failure with tubular necrosis: Secondary | ICD-10-CM | POA: Diagnosis not present

## 2020-04-17 DIAGNOSIS — M2578 Osteophyte, vertebrae: Secondary | ICD-10-CM | POA: Diagnosis not present

## 2020-04-17 DIAGNOSIS — R0602 Shortness of breath: Secondary | ICD-10-CM

## 2020-04-17 DIAGNOSIS — D539 Nutritional anemia, unspecified: Secondary | ICD-10-CM | POA: Diagnosis not present

## 2020-04-17 DIAGNOSIS — I9589 Other hypotension: Secondary | ICD-10-CM | POA: Diagnosis present

## 2020-04-17 DIAGNOSIS — D696 Thrombocytopenia, unspecified: Secondary | ICD-10-CM | POA: Diagnosis present

## 2020-04-17 DIAGNOSIS — I9789 Other postprocedural complications and disorders of the circulatory system, not elsewhere classified: Secondary | ICD-10-CM | POA: Diagnosis not present

## 2020-04-17 DIAGNOSIS — I480 Paroxysmal atrial fibrillation: Secondary | ICD-10-CM

## 2020-04-17 DIAGNOSIS — F1390 Sedative, hypnotic, or anxiolytic use, unspecified, uncomplicated: Secondary | ICD-10-CM | POA: Diagnosis present

## 2020-04-17 DIAGNOSIS — E861 Hypovolemia: Secondary | ICD-10-CM | POA: Diagnosis not present

## 2020-04-17 DIAGNOSIS — Z20822 Contact with and (suspected) exposure to covid-19: Secondary | ICD-10-CM | POA: Diagnosis present

## 2020-04-17 DIAGNOSIS — R8289 Other abnormal findings on cytological and histological examination of urine: Secondary | ICD-10-CM | POA: Diagnosis not present

## 2020-04-17 DIAGNOSIS — K5730 Diverticulosis of large intestine without perforation or abscess without bleeding: Secondary | ICD-10-CM | POA: Diagnosis not present

## 2020-04-17 DIAGNOSIS — E876 Hypokalemia: Secondary | ICD-10-CM | POA: Diagnosis not present

## 2020-04-17 DIAGNOSIS — Z4682 Encounter for fitting and adjustment of non-vascular catheter: Secondary | ICD-10-CM | POA: Diagnosis not present

## 2020-04-17 DIAGNOSIS — I517 Cardiomegaly: Secondary | ICD-10-CM | POA: Diagnosis not present

## 2020-04-17 DIAGNOSIS — J9690 Respiratory failure, unspecified, unspecified whether with hypoxia or hypercapnia: Secondary | ICD-10-CM | POA: Diagnosis not present

## 2020-04-17 DIAGNOSIS — Z4659 Encounter for fitting and adjustment of other gastrointestinal appliance and device: Secondary | ICD-10-CM

## 2020-04-17 DIAGNOSIS — K589 Irritable bowel syndrome without diarrhea: Secondary | ICD-10-CM | POA: Diagnosis present

## 2020-04-17 DIAGNOSIS — J9600 Acute respiratory failure, unspecified whether with hypoxia or hypercapnia: Secondary | ICD-10-CM

## 2020-04-17 DIAGNOSIS — I358 Other nonrheumatic aortic valve disorders: Secondary | ICD-10-CM | POA: Diagnosis present

## 2020-04-17 DIAGNOSIS — G8918 Other acute postprocedural pain: Secondary | ICD-10-CM | POA: Diagnosis not present

## 2020-04-17 DIAGNOSIS — Z7951 Long term (current) use of inhaled steroids: Secondary | ICD-10-CM

## 2020-04-17 DIAGNOSIS — S301XXA Contusion of abdominal wall, initial encounter: Secondary | ICD-10-CM | POA: Diagnosis not present

## 2020-04-17 DIAGNOSIS — I4891 Unspecified atrial fibrillation: Secondary | ICD-10-CM | POA: Diagnosis not present

## 2020-04-17 DIAGNOSIS — R410 Disorientation, unspecified: Secondary | ICD-10-CM | POA: Diagnosis not present

## 2020-04-17 DIAGNOSIS — R609 Edema, unspecified: Secondary | ICD-10-CM | POA: Diagnosis not present

## 2020-04-17 DIAGNOSIS — I959 Hypotension, unspecified: Secondary | ICD-10-CM | POA: Diagnosis not present

## 2020-04-17 DIAGNOSIS — Z923 Personal history of irradiation: Secondary | ICD-10-CM

## 2020-04-17 DIAGNOSIS — E872 Acidosis: Secondary | ICD-10-CM | POA: Diagnosis not present

## 2020-04-17 DIAGNOSIS — G9340 Encephalopathy, unspecified: Secondary | ICD-10-CM | POA: Diagnosis not present

## 2020-04-17 DIAGNOSIS — B9620 Unspecified Escherichia coli [E. coli] as the cause of diseases classified elsewhere: Secondary | ICD-10-CM | POA: Diagnosis not present

## 2020-04-17 DIAGNOSIS — R9082 White matter disease, unspecified: Secondary | ICD-10-CM | POA: Diagnosis not present

## 2020-04-17 DIAGNOSIS — Z961 Presence of intraocular lens: Secondary | ICD-10-CM | POA: Diagnosis present

## 2020-04-17 DIAGNOSIS — J9602 Acute respiratory failure with hypercapnia: Secondary | ICD-10-CM | POA: Diagnosis not present

## 2020-04-17 DIAGNOSIS — J181 Lobar pneumonia, unspecified organism: Secondary | ICD-10-CM | POA: Diagnosis not present

## 2020-04-17 DIAGNOSIS — Z8249 Family history of ischemic heart disease and other diseases of the circulatory system: Secondary | ICD-10-CM

## 2020-04-17 DIAGNOSIS — R1084 Generalized abdominal pain: Secondary | ICD-10-CM | POA: Diagnosis not present

## 2020-04-17 DIAGNOSIS — M4802 Spinal stenosis, cervical region: Secondary | ICD-10-CM | POA: Diagnosis not present

## 2020-04-17 DIAGNOSIS — I745 Embolism and thrombosis of iliac artery: Secondary | ICD-10-CM | POA: Diagnosis not present

## 2020-04-17 DIAGNOSIS — F419 Anxiety disorder, unspecified: Secondary | ICD-10-CM | POA: Diagnosis present

## 2020-04-17 DIAGNOSIS — T50905A Adverse effect of unspecified drugs, medicaments and biological substances, initial encounter: Secondary | ICD-10-CM | POA: Diagnosis not present

## 2020-04-17 DIAGNOSIS — Q272 Other congenital malformations of renal artery: Secondary | ICD-10-CM

## 2020-04-17 DIAGNOSIS — I5031 Acute diastolic (congestive) heart failure: Secondary | ICD-10-CM | POA: Diagnosis not present

## 2020-04-17 DIAGNOSIS — D62 Acute posthemorrhagic anemia: Secondary | ICD-10-CM | POA: Diagnosis not present

## 2020-04-17 DIAGNOSIS — L8990 Pressure ulcer of unspecified site, unspecified stage: Secondary | ICD-10-CM | POA: Diagnosis not present

## 2020-04-17 DIAGNOSIS — R000 Tachycardia, unspecified: Secondary | ICD-10-CM | POA: Diagnosis not present

## 2020-04-17 HISTORY — PX: AORTA - BILATERAL FEMORAL ARTERY BYPASS GRAFT: SHX1175

## 2020-04-17 LAB — POCT I-STAT 7, (LYTES, BLD GAS, ICA,H+H)
Acid-Base Excess: 1 mmol/L (ref 0.0–2.0)
Acid-base deficit: 1 mmol/L (ref 0.0–2.0)
Acid-base deficit: 4 mmol/L — ABNORMAL HIGH (ref 0.0–2.0)
Acid-base deficit: 6 mmol/L — ABNORMAL HIGH (ref 0.0–2.0)
Acid-base deficit: 8 mmol/L — ABNORMAL HIGH (ref 0.0–2.0)
Bicarbonate: 28.4 mmol/L — ABNORMAL HIGH (ref 20.0–28.0)
Bicarbonate: 25.5 mmol/L (ref 20.0–28.0)
Bicarbonate: 24 mmol/L (ref 20.0–28.0)
Bicarbonate: 20.7 mmol/L (ref 20.0–28.0)
Bicarbonate: 18.5 mmol/L — ABNORMAL LOW (ref 20.0–28.0)
Calcium, Ion: 1.26 mmol/L (ref 1.15–1.40)
Calcium, Ion: 1.19 mmol/L (ref 1.15–1.40)
Calcium, Ion: 1.36 mmol/L (ref 1.15–1.40)
Calcium, Ion: 1.2 mmol/L (ref 1.15–1.40)
Calcium, Ion: 1.17 mmol/L (ref 1.15–1.40)
HCT: 38 % — ABNORMAL LOW (ref 39.0–52.0)
HCT: 34 % — ABNORMAL LOW (ref 39.0–52.0)
HCT: 33 % — ABNORMAL LOW (ref 39.0–52.0)
HCT: 31 % — ABNORMAL LOW (ref 39.0–52.0)
HCT: 31 % — ABNORMAL LOW (ref 39.0–52.0)
Hemoglobin: 12.9 g/dL — ABNORMAL LOW (ref 13.0–17.0)
Hemoglobin: 11.6 g/dL — ABNORMAL LOW (ref 13.0–17.0)
Hemoglobin: 11.2 g/dL — ABNORMAL LOW (ref 13.0–17.0)
Hemoglobin: 10.5 g/dL — ABNORMAL LOW (ref 13.0–17.0)
Hemoglobin: 10.5 g/dL — ABNORMAL LOW (ref 13.0–17.0)
O2 Saturation: 99 %
O2 Saturation: 97 %
O2 Saturation: 99 %
O2 Saturation: 94 %
O2 Saturation: 95 %
Patient temperature: 35.6
Patient temperature: 36.5
Patient temperature: 36.3
Patient temperature: 97.4
Potassium: 4.4 mmol/L (ref 3.5–5.1)
Potassium: 5.1 mmol/L (ref 3.5–5.1)
Potassium: 5 mmol/L (ref 3.5–5.1)
Potassium: 4.8 mmol/L (ref 3.5–5.1)
Potassium: 5.4 mmol/L — ABNORMAL HIGH (ref 3.5–5.1)
Sodium: 140 mmol/L (ref 135–145)
Sodium: 140 mmol/L (ref 135–145)
Sodium: 141 mmol/L (ref 135–145)
Sodium: 143 mmol/L (ref 135–145)
Sodium: 143 mmol/L (ref 135–145)
TCO2: 30 mmol/L (ref 22–32)
TCO2: 27 mmol/L (ref 22–32)
TCO2: 26 mmol/L (ref 22–32)
TCO2: 22 mmol/L (ref 22–32)
TCO2: 20 mmol/L — ABNORMAL LOW (ref 22–32)
pCO2 arterial: 50.9 mmHg — ABNORMAL HIGH (ref 32.0–48.0)
pCO2 arterial: 47.7 mmHg (ref 32.0–48.0)
pCO2 arterial: 52.3 mmHg — ABNORMAL HIGH (ref 32.0–48.0)
pCO2 arterial: 44 mmHg (ref 32.0–48.0)
pCO2 arterial: 40.1 mmHg (ref 32.0–48.0)
pH, Arterial: 7.348 — ABNORMAL LOW (ref 7.350–7.450)
pH, Arterial: 7.333 — ABNORMAL LOW (ref 7.350–7.450)
pH, Arterial: 7.266 — ABNORMAL LOW (ref 7.350–7.450)
pH, Arterial: 7.282 — ABNORMAL LOW (ref 7.350–7.450)
pH, Arterial: 7.27 — ABNORMAL LOW (ref 7.350–7.450)
pO2, Arterial: 170 mmHg — ABNORMAL HIGH (ref 83.0–108.0)
pO2, Arterial: 93 mmHg (ref 83.0–108.0)
pO2, Arterial: 182 mmHg — ABNORMAL HIGH (ref 83.0–108.0)
pO2, Arterial: 79 mmHg — ABNORMAL LOW (ref 83.0–108.0)
pO2, Arterial: 81 mmHg — ABNORMAL LOW (ref 83.0–108.0)

## 2020-04-17 LAB — BASIC METABOLIC PANEL
Anion gap: 10 (ref 5–15)
BUN: 18 mg/dL (ref 8–23)
CO2: 20 mmol/L — ABNORMAL LOW (ref 22–32)
Calcium: 8.7 mg/dL — ABNORMAL LOW (ref 8.9–10.3)
Chloride: 111 mmol/L (ref 98–111)
Creatinine, Ser: 1.19 mg/dL (ref 0.61–1.24)
GFR, Estimated: 58 mL/min — ABNORMAL LOW (ref >60)
Glucose, Bld: 202 mg/dL — ABNORMAL HIGH (ref 70–99)
Potassium: 4.5 mmol/L (ref 3.5–5.1)
Sodium: 141 mmol/L (ref 135–145)

## 2020-04-17 LAB — POCT ACTIVATED CLOTTING TIME
Activated Clotting Time: 312 seconds
Activated Clotting Time: 235 seconds
Activated Clotting Time: 208 seconds
Activated Clotting Time: 114 seconds

## 2020-04-17 LAB — CBC
HCT: 35.2 % — ABNORMAL LOW (ref 39.0–52.0)
Hemoglobin: 11.3 g/dL — ABNORMAL LOW (ref 13.0–17.0)
MCH: 33.9 pg (ref 26.0–34.0)
MCHC: 32.1 g/dL (ref 30.0–36.0)
MCV: 105.7 fL — ABNORMAL HIGH (ref 80.0–100.0)
Platelets: 159 10*3/uL (ref 150–400)
RBC: 3.33 MIL/uL — ABNORMAL LOW (ref 4.22–5.81)
RDW: 13.1 % (ref 11.5–15.5)
WBC: 14.3 10*3/uL — ABNORMAL HIGH (ref 4.0–10.5)
nRBC: 0 % (ref 0.0–0.2)

## 2020-04-17 LAB — MAGNESIUM: Magnesium: 1.7 mg/dL (ref 1.7–2.4)

## 2020-04-17 LAB — PROTIME-INR
INR: 1.2 (ref 0.8–1.2)
Prothrombin Time: 14.9 seconds (ref 11.4–15.2)

## 2020-04-17 LAB — APTT: aPTT: 31 seconds (ref 24–36)

## 2020-04-17 LAB — PREPARE RBC (CROSSMATCH)

## 2020-04-17 SURGERY — CREATION, BYPASS, ARTERIAL, AORTA TO FEMORAL, BILATERAL, USING GRAFT
Anesthesia: General

## 2020-04-17 MED ORDER — HEPARIN SODIUM (PORCINE) 1000 UNIT/ML IJ SOLN
INTRAMUSCULAR | Status: DC | PRN
  Administered 2020-04-17: 3000 [IU] via INTRAVENOUS
  Administered 2020-04-17: 10000 [IU] via INTRAVENOUS

## 2020-04-17 MED ORDER — CEFAZOLIN SODIUM-DEXTROSE 2-4 GM/100ML-% IV SOLN
2.0000 g | INTRAVENOUS | Status: AC
  Administered 2020-04-17 (×2): 2 g via INTRAVENOUS

## 2020-04-17 MED ORDER — ONDANSETRON HCL 4 MG/2ML IJ SOLN
INTRAMUSCULAR | Status: AC
  Filled 2020-04-17: qty 2

## 2020-04-17 MED ORDER — PHENYLEPHRINE 40 MCG/ML (10ML) SYRINGE FOR IV PUSH (FOR BLOOD PRESSURE SUPPORT)
PREFILLED_SYRINGE | INTRAVENOUS | Status: DC | PRN
  Administered 2020-04-17 (×5): 80 ug via INTRAVENOUS

## 2020-04-17 MED ORDER — PROTAMINE SULFATE 10 MG/ML IV SOLN
INTRAVENOUS | Status: DC | PRN
  Administered 2020-04-17: 40 mg via INTRAVENOUS

## 2020-04-17 MED ORDER — DEXAMETHASONE SODIUM PHOSPHATE 10 MG/ML IJ SOLN
INTRAMUSCULAR | Status: DC | PRN
  Administered 2020-04-17: 5 mg via INTRAVENOUS

## 2020-04-17 MED ORDER — ASPIRIN EC 81 MG PO TBEC: 81.0000 mg | DELAYED_RELEASE_TABLET | Freq: Every day | ORAL | Status: DC

## 2020-04-17 MED ORDER — 0.9 % SODIUM CHLORIDE (POUR BTL) OPTIME
TOPICAL | Status: DC | PRN
  Administered 2020-04-17: 3000 mL
  Administered 2020-04-17: 1000 mL

## 2020-04-17 MED ORDER — FENTANYL CITRATE (PF) 250 MCG/5ML IJ SOLN
INTRAMUSCULAR | Status: AC
  Filled 2020-04-17: qty 5

## 2020-04-17 MED ORDER — FLEET ENEMA 7-19 GM/118ML RE ENEM
1.0000 | ENEMA | Freq: Once | RECTAL | Status: DC | PRN
  Filled 2020-04-17: qty 1

## 2020-04-17 MED ORDER — ACETAMINOPHEN 325 MG RE SUPP: 325.0000 mg | RECTAL | Status: DC | PRN

## 2020-04-17 MED ORDER — CALCIUM CHLORIDE 10 % IV SOLN
INTRAVENOUS | Status: DC | PRN
  Administered 2020-04-17 (×3): 200 mg via INTRAVENOUS

## 2020-04-17 MED ORDER — ONDANSETRON HCL 4 MG/2ML IJ SOLN: 4.0000 mg | Freq: Four times a day (QID) | INTRAMUSCULAR | Status: DC | PRN

## 2020-04-17 MED ORDER — EPHEDRINE SULFATE-NACL 50-0.9 MG/10ML-% IV SOSY
PREFILLED_SYRINGE | INTRAVENOUS | Status: DC | PRN
  Administered 2020-04-17 (×5): 5 mg via INTRAVENOUS
  Administered 2020-04-17: 10 mg via INTRAVENOUS
  Administered 2020-04-17: 5 mg via INTRAVENOUS

## 2020-04-17 MED ORDER — ASPIRIN 81 MG PO CHEW
81.0000 mg | CHEWABLE_TABLET | Freq: Every day | ORAL | Status: DC
  Administered 2020-04-18 – 2020-04-19 (×2): 81 mg
  Filled 2020-04-17 (×2): qty 1

## 2020-04-17 MED ORDER — GUAIFENESIN-DM 100-10 MG/5ML PO SYRP: 15.0000 mL | ORAL_SOLUTION | ORAL | Status: DC | PRN

## 2020-04-17 MED ORDER — PANTOPRAZOLE SODIUM 40 MG PO TBEC: 40.0000 mg | DELAYED_RELEASE_TABLET | Freq: Every day | ORAL | Status: DC

## 2020-04-17 MED ORDER — CHLORHEXIDINE GLUCONATE CLOTH 2 % EX PADS: 6.0000 | MEDICATED_PAD | Freq: Once | CUTANEOUS | Status: DC

## 2020-04-17 MED ORDER — LIDOCAINE 2% (20 MG/ML) 5 ML SYRINGE
INTRAMUSCULAR | Status: AC
  Filled 2020-04-17: qty 5

## 2020-04-17 MED ORDER — ALUM & MAG HYDROXIDE-SIMETH 200-200-20 MG/5ML PO SUSP: 15.0000 mL | ORAL | Status: DC | PRN

## 2020-04-17 MED ORDER — ROCURONIUM BROMIDE 10 MG/ML (PF) SYRINGE
PREFILLED_SYRINGE | INTRAVENOUS | Status: DC | PRN
  Administered 2020-04-17 (×2): 20 mg via INTRAVENOUS
  Administered 2020-04-17: 80 mg via INTRAVENOUS
  Administered 2020-04-17: 40 mg via INTRAVENOUS
  Administered 2020-04-17: 30 mg via INTRAVENOUS

## 2020-04-17 MED ORDER — NITROGLYCERIN IN D5W 200-5 MCG/ML-% IV SOLN
INTRAVENOUS | Status: DC | PRN
  Administered 2020-04-17: .04 mg via INTRAVENOUS
  Administered 2020-04-17: .02 mg via INTRAVENOUS

## 2020-04-17 MED ORDER — FENTANYL CITRATE (PF) 250 MCG/5ML IJ SOLN
INTRAMUSCULAR | Status: DC | PRN
  Administered 2020-04-17 (×5): 50 ug via INTRAVENOUS
  Administered 2020-04-17: 100 ug via INTRAVENOUS
  Administered 2020-04-17 (×3): 50 ug via INTRAVENOUS

## 2020-04-17 MED ORDER — HEPARIN SODIUM (PORCINE) 5000 UNIT/ML IJ SOLN
5000.0000 [IU] | Freq: Three times a day (TID) | INTRAMUSCULAR | Status: DC
  Administered 2020-04-17 – 2020-04-20 (×8): 5000 [IU] via SUBCUTANEOUS
  Filled 2020-04-17 (×8): qty 1

## 2020-04-17 MED ORDER — CHLORHEXIDINE GLUCONATE 0.12 % MT SOLN
15.0000 mL | Freq: Once | OROMUCOSAL | Status: AC
  Administered 2020-04-17: 15 mL via OROMUCOSAL

## 2020-04-17 MED ORDER — ALBUTEROL SULFATE HFA 108 (90 BASE) MCG/ACT IN AERS: 2.0000 | INHALATION_SPRAY | Freq: Four times a day (QID) | RESPIRATORY_TRACT | Status: DC | PRN

## 2020-04-17 MED ORDER — ONDANSETRON HCL 4 MG/2ML IJ SOLN: 4.0000 mg | Freq: Once | INTRAMUSCULAR | Status: DC | PRN

## 2020-04-17 MED ORDER — KCL IN DEXTROSE-NACL 20-5-0.9 MEQ/L-%-% IV SOLN
INTRAVENOUS | Status: DC
  Filled 2020-04-17: qty 1000

## 2020-04-17 MED ORDER — FLUTICASONE FUROATE-VILANTEROL 100-25 MCG/INH IN AEPB
1.0000 | INHALATION_SPRAY | Freq: Every day | RESPIRATORY_TRACT | Status: DC
  Administered 2020-04-21 – 2020-05-03 (×9): 1 via RESPIRATORY_TRACT
  Filled 2020-04-17 (×3): qty 28

## 2020-04-17 MED ORDER — CEFAZOLIN SODIUM-DEXTROSE 2-4 GM/100ML-% IV SOLN
2.0000 g | Freq: Three times a day (TID) | INTRAVENOUS | Status: AC
  Administered 2020-04-17 – 2020-04-18 (×2): 2 g via INTRAVENOUS
  Filled 2020-04-17 (×3): qty 100

## 2020-04-17 MED ORDER — SODIUM CHLORIDE 0.9 % IV BOLUS
1000.0000 mL | Freq: Once | INTRAVENOUS | Status: AC
  Administered 2020-04-17: 1000 mL via INTRAVENOUS

## 2020-04-17 MED ORDER — SUCCINYLCHOLINE CHLORIDE 200 MG/10ML IV SOSY
PREFILLED_SYRINGE | INTRAVENOUS | Status: AC
  Filled 2020-04-17: qty 10

## 2020-04-17 MED ORDER — OXYCODONE HCL 5 MG PO TABS: 5.0000 mg | ORAL_TABLET | Freq: Once | ORAL | Status: DC | PRN

## 2020-04-17 MED ORDER — ONDANSETRON HCL 4 MG/2ML IJ SOLN
INTRAMUSCULAR | Status: DC | PRN
  Administered 2020-04-17: 4 mg via INTRAVENOUS

## 2020-04-17 MED ORDER — GUAIFENESIN-DM 100-10 MG/5ML PO SYRP
15.0000 mL | ORAL_SOLUTION | ORAL | Status: DC | PRN
  Administered 2020-04-25: 15 mL
  Filled 2020-04-17: qty 15

## 2020-04-17 MED ORDER — PROPOFOL 10 MG/ML IV BOLUS
INTRAVENOUS | Status: DC | PRN
  Administered 2020-04-17: 40 mg via INTRAVENOUS
  Administered 2020-04-17: 30 mg via INTRAVENOUS
  Administered 2020-04-17: 50 mg via INTRAVENOUS
  Administered 2020-04-17: 40 mg via INTRAVENOUS
  Administered 2020-04-17: 30 mg via INTRAVENOUS
  Administered 2020-04-17: 150 mg via INTRAVENOUS
  Administered 2020-04-17: 40 mg via INTRAVENOUS

## 2020-04-17 MED ORDER — ORAL CARE MOUTH RINSE
15.0000 mL | Freq: Two times a day (BID) | OROMUCOSAL | Status: DC
  Administered 2020-04-17 – 2020-04-24 (×14): 15 mL via OROMUCOSAL

## 2020-04-17 MED ORDER — HEMOSTATIC AGENTS (NO CHARGE) OPTIME
TOPICAL | Status: DC | PRN
  Administered 2020-04-17: 1 via TOPICAL

## 2020-04-17 MED ORDER — ACETAMINOPHEN 325 MG RE SUPP
325.0000 mg | RECTAL | Status: DC | PRN
  Administered 2020-04-21: 650 mg via RECTAL
  Filled 2020-04-17: qty 2

## 2020-04-17 MED ORDER — METOPROLOL TARTRATE 5 MG/5ML IV SOLN: 2.0000 mg | INTRAVENOUS | Status: DC | PRN

## 2020-04-17 MED ORDER — ORAL CARE MOUTH RINSE: 15.0000 mL | Freq: Once | OROMUCOSAL | Status: AC

## 2020-04-17 MED ORDER — PHENYLEPHRINE HCL-NACL 10-0.9 MG/250ML-% IV SOLN
0.0000 ug/min | INTRAVENOUS | Status: DC
  Administered 2020-04-17: 25 ug/min via INTRAVENOUS
  Administered 2020-04-17: 15 ug/min via INTRAVENOUS
  Administered 2020-04-18: 45 ug/min via INTRAVENOUS
  Filled 2020-04-17 (×3): qty 250

## 2020-04-17 MED ORDER — PHENYLEPHRINE HCL-NACL 10-0.9 MG/250ML-% IV SOLN
INTRAVENOUS | Status: DC | PRN
  Administered 2020-04-17: 40 ug/min via INTRAVENOUS

## 2020-04-17 MED ORDER — SODIUM CHLORIDE 0.9 % IV SOLN: INTRAVENOUS | Status: DC

## 2020-04-17 MED ORDER — ATORVASTATIN CALCIUM 10 MG PO TABS
10.0000 mg | ORAL_TABLET | Freq: Every day | ORAL | Status: DC
  Administered 2020-04-17: 10 mg via ORAL
  Filled 2020-04-17: qty 1

## 2020-04-17 MED ORDER — FLUTICASONE-UMECLIDIN-VILANT 100-62.5-25 MCG/INH IN AEPB: 1.0000 | INHALATION_SPRAY | Freq: Every day | RESPIRATORY_TRACT | Status: DC

## 2020-04-17 MED ORDER — SUGAMMADEX SODIUM 200 MG/2ML IV SOLN
INTRAVENOUS | Status: DC | PRN
  Administered 2020-04-17: 200 mg via INTRAVENOUS

## 2020-04-17 MED ORDER — BISACODYL 10 MG RE SUPP: 10.0000 mg | Freq: Every day | RECTAL | Status: DC | PRN

## 2020-04-17 MED ORDER — SODIUM CHLORIDE 0.9 % IV SOLN
INTRAVENOUS | Status: DC | PRN
  Administered 2020-04-17: 500 mL

## 2020-04-17 MED ORDER — ALBUMIN HUMAN 5 % IV SOLN: INTRAVENOUS | Status: DC | PRN

## 2020-04-17 MED ORDER — HYDROMORPHONE HCL 1 MG/ML IJ SOLN
0.5000 mg | INTRAMUSCULAR | Status: DC | PRN
  Administered 2020-04-17 (×2): 1 mg via INTRAVENOUS
  Administered 2020-04-17: 0.5 mg via INTRAVENOUS
  Administered 2020-04-18 – 2020-04-19 (×15): 1 mg via INTRAVENOUS
  Filled 2020-04-17 (×20): qty 1

## 2020-04-17 MED ORDER — ROCURONIUM BROMIDE 10 MG/ML (PF) SYRINGE
PREFILLED_SYRINGE | INTRAVENOUS | Status: AC
  Filled 2020-04-17: qty 10

## 2020-04-17 MED ORDER — DEXAMETHASONE SODIUM PHOSPHATE 10 MG/ML IJ SOLN
INTRAMUSCULAR | Status: AC
  Filled 2020-04-17: qty 1

## 2020-04-17 MED ORDER — SODIUM CHLORIDE 0.9% IV SOLUTION: Freq: Once | INTRAVENOUS | Status: DC

## 2020-04-17 MED ORDER — LACTATED RINGERS IV SOLN: INTRAVENOUS | Status: DC

## 2020-04-17 MED ORDER — POTASSIUM CHLORIDE CRYS ER 20 MEQ PO TBCR: 20.0000 meq | EXTENDED_RELEASE_TABLET | Freq: Every day | ORAL | Status: DC | PRN

## 2020-04-17 MED ORDER — PHENOL 1.4 % MT LIQD: 1.0000 | OROMUCOSAL | Status: DC | PRN

## 2020-04-17 MED ORDER — PHENYLEPHRINE HCL-NACL 10-0.9 MG/250ML-% IV SOLN
0.0000 ug/min | INTRAVENOUS | Status: DC
  Administered 2020-04-17: 20 ug/min via INTRAVENOUS
  Filled 2020-04-17 (×2): qty 250

## 2020-04-17 MED ORDER — ACETAMINOPHEN 160 MG/5ML PO SOLN: 325.0000 mg | ORAL | Status: DC | PRN

## 2020-04-17 MED ORDER — HYDROMORPHONE HCL 1 MG/ML IJ SOLN
INTRAMUSCULAR | Status: AC
  Filled 2020-04-17: qty 1

## 2020-04-17 MED ORDER — DEXTROSE-NACL 5-0.9 % IV SOLN: INTRAVENOUS | Status: DC

## 2020-04-17 MED ORDER — PHENYLEPHRINE 40 MCG/ML (10ML) SYRINGE FOR IV PUSH (FOR BLOOD PRESSURE SUPPORT)
PREFILLED_SYRINGE | INTRAVENOUS | Status: AC
  Filled 2020-04-17: qty 10

## 2020-04-17 MED ORDER — CHLORHEXIDINE GLUCONATE CLOTH 2 % EX PADS
6.0000 | MEDICATED_PAD | Freq: Every day | CUTANEOUS | Status: DC
  Administered 2020-04-17 – 2020-05-29 (×37): 6 via TOPICAL

## 2020-04-17 MED ORDER — HYDRALAZINE HCL 20 MG/ML IJ SOLN
5.0000 mg | INTRAMUSCULAR | Status: AC | PRN
  Administered 2020-04-20 – 2020-04-29 (×2): 5 mg via INTRAVENOUS
  Filled 2020-04-17 (×3): qty 1

## 2020-04-17 MED ORDER — SODIUM CHLORIDE 0.9 % IV SOLN
500.0000 mL | Freq: Once | INTRAVENOUS | Status: AC | PRN
  Administered 2020-04-18: 500 mL via INTRAVENOUS

## 2020-04-17 MED ORDER — LIDOCAINE 2% (20 MG/ML) 5 ML SYRINGE
INTRAMUSCULAR | Status: DC | PRN
  Administered 2020-04-17: 60 mg via INTRAVENOUS

## 2020-04-17 MED ORDER — PANTOPRAZOLE SODIUM 40 MG PO PACK
40.0000 mg | PACK | Freq: Every day | ORAL | Status: DC
  Administered 2020-04-18: 40 mg
  Filled 2020-04-17: qty 20

## 2020-04-17 MED ORDER — ACETAMINOPHEN 160 MG/5ML PO SOLN
325.0000 mg | ORAL | Status: DC | PRN
  Administered 2020-04-19 – 2020-04-24 (×2): 650 mg
  Filled 2020-04-17 (×2): qty 20.3

## 2020-04-17 MED ORDER — LACTATED RINGERS IV SOLN: INTRAVENOUS | Status: DC | PRN

## 2020-04-17 MED ORDER — ACETAMINOPHEN 325 MG PO TABS: 325.0000 mg | ORAL_TABLET | ORAL | Status: DC | PRN

## 2020-04-17 MED ORDER — SODIUM CHLORIDE 0.9 % IV SOLN
250.0000 mL | INTRAVENOUS | Status: DC
  Administered 2020-04-23 – 2020-05-23 (×5): 250 mL via INTRAVENOUS

## 2020-04-17 MED ORDER — OXYCODONE HCL 5 MG/5ML PO SOLN: 5.0000 mg | Freq: Once | ORAL | Status: DC | PRN

## 2020-04-17 MED ORDER — PROPOFOL 10 MG/ML IV BOLUS
INTRAVENOUS | Status: AC
  Filled 2020-04-17: qty 40

## 2020-04-17 MED ORDER — ATORVASTATIN CALCIUM 10 MG PO TABS
10.0000 mg | ORAL_TABLET | Freq: Every day | ORAL | Status: DC
  Administered 2020-04-18 – 2020-04-23 (×4): 10 mg
  Filled 2020-04-17 (×4): qty 1

## 2020-04-17 MED ORDER — HYDROMORPHONE HCL 1 MG/ML IJ SOLN
0.2500 mg | INTRAMUSCULAR | Status: DC | PRN
  Administered 2020-04-17 (×3): 0.5 mg via INTRAVENOUS

## 2020-04-17 MED ORDER — MAGNESIUM SULFATE 2 GM/50ML IV SOLN
2.0000 g | Freq: Every day | INTRAVENOUS | Status: AC | PRN
  Administered 2020-04-18: 2 g via INTRAVENOUS
  Filled 2020-04-17: qty 50

## 2020-04-17 MED ORDER — EPHEDRINE 5 MG/ML INJ
INTRAVENOUS | Status: AC
  Filled 2020-04-17: qty 10

## 2020-04-17 MED ORDER — SODIUM CHLORIDE 0.9 % IV SOLN
INTRAVENOUS | Status: AC
  Filled 2020-04-17: qty 1.2

## 2020-04-17 MED ORDER — LABETALOL HCL 5 MG/ML IV SOLN
10.0000 mg | INTRAVENOUS | Status: DC | PRN
  Administered 2020-04-19: 10 mg via INTRAVENOUS
  Filled 2020-04-17: qty 4

## 2020-04-17 SURGICAL SUPPLY — 69 items
ADH SKN CLS APL DERMABOND .7 (GAUZE/BANDAGES/DRESSINGS) ×2
CANISTER SUCT 3000ML PPV (MISCELLANEOUS) ×2 IMPLANT
CATH ROB NEL 14FR STERILE (CATHETERS) ×1 IMPLANT
CLIP VESOCCLUDE MED 24/CT (CLIP) ×2 IMPLANT
CLIP VESOCCLUDE SM WIDE 24/CT (CLIP) ×2 IMPLANT
DERMABOND ADVANCED (GAUZE/BANDAGES/DRESSINGS) ×2
DERMABOND ADVANCED .7 DNX12 (GAUZE/BANDAGES/DRESSINGS) ×2 IMPLANT
DRAPE INCISE IOBAN 66X45 STRL (DRAPES) ×1 IMPLANT
ELECT BLADE 4.0 EZ CLEAN MEGAD (MISCELLANEOUS) ×2
ELECT BLADE 6.5 EXT (BLADE) IMPLANT
ELECT REM PT RETURN 9FT ADLT (ELECTROSURGICAL) ×2
ELECTRODE BLDE 4.0 EZ CLN MEGD (MISCELLANEOUS) ×1 IMPLANT
ELECTRODE REM PT RTRN 9FT ADLT (ELECTROSURGICAL) ×1 IMPLANT
FELT TEFLON 1X6 (MISCELLANEOUS) ×1 IMPLANT
GLOVE BIO SURGEON STRL SZ 6.5 (GLOVE) ×3 IMPLANT
GLOVE BIO SURGEON STRL SZ7.5 (GLOVE) ×3 IMPLANT
GLOVE BIOGEL PI IND STRL 6.5 (GLOVE) IMPLANT
GLOVE BIOGEL PI IND STRL 7.0 (GLOVE) IMPLANT
GLOVE BIOGEL PI IND STRL 7.5 (GLOVE) IMPLANT
GLOVE BIOGEL PI INDICATOR 6.5 (GLOVE) ×5
GLOVE BIOGEL PI INDICATOR 7.0 (GLOVE) ×2
GLOVE BIOGEL PI INDICATOR 7.5 (GLOVE) ×2
GLOVE ECLIPSE 6.5 STRL STRAW (GLOVE) ×3 IMPLANT
GLOVE ECLIPSE 7.0 STRL STRAW (GLOVE) ×2 IMPLANT
GLOVE SURG SS PI 6.5 STRL IVOR (GLOVE) ×1 IMPLANT
GOWN STRL REUS W/ TWL LRG LVL3 (GOWN DISPOSABLE) ×2 IMPLANT
GOWN STRL REUS W/ TWL XL LVL3 (GOWN DISPOSABLE) ×1 IMPLANT
GOWN STRL REUS W/TWL LRG LVL3 (GOWN DISPOSABLE) ×10
GOWN STRL REUS W/TWL XL LVL3 (GOWN DISPOSABLE) ×4
GRAFT HEMASHIELD 16X8MM (Vascular Products) ×1 IMPLANT
HEMOSTAT SNOW SURGICEL 2X4 (HEMOSTASIS) ×1 IMPLANT
INSERT FOGARTY 61MM (MISCELLANEOUS) ×1 IMPLANT
INSERT FOGARTY SM (MISCELLANEOUS) ×5 IMPLANT
KIT BASIN OR (CUSTOM PROCEDURE TRAY) ×2 IMPLANT
KIT TURNOVER KIT B (KITS) ×2 IMPLANT
LOOP VESSEL MAXI BLUE (MISCELLANEOUS) ×1 IMPLANT
NS IRRIG 1000ML POUR BTL (IV SOLUTION) ×5 IMPLANT
PACK AORTA (CUSTOM PROCEDURE TRAY) ×2 IMPLANT
PAD ARMBOARD 7.5X6 YLW CONV (MISCELLANEOUS) ×4 IMPLANT
RETAINER VISCERA MED (MISCELLANEOUS) ×2 IMPLANT
SPONGE LAP 18X18 RF (DISPOSABLE) ×8 IMPLANT
SUT MNCRL AB 4-0 PS2 18 (SUTURE) ×5 IMPLANT
SUT PDS AB 1 TP1 54 (SUTURE) ×4 IMPLANT
SUT PROLENE 3 0 SH 48 (SUTURE) ×3 IMPLANT
SUT PROLENE 3 0 SH DA (SUTURE) ×3 IMPLANT
SUT PROLENE 4 0 RB 1 (SUTURE) ×8
SUT PROLENE 4-0 RB1 .5 CRCL 36 (SUTURE) IMPLANT
SUT PROLENE 5 0 C 1 24 (SUTURE) ×7 IMPLANT
SUT PROLENE 5 0 C 1 36 (SUTURE) ×5 IMPLANT
SUT PROLENE 6 0 BV (SUTURE) ×1 IMPLANT
SUT SILK 2 0 (SUTURE) ×2
SUT SILK 2 0 SH CR/8 (SUTURE) ×1 IMPLANT
SUT SILK 2 0 TIES 17X18 (SUTURE) ×2
SUT SILK 2 0SH CR/8 30 (SUTURE) ×2 IMPLANT
SUT SILK 2-0 18XBRD TIE 12 (SUTURE) ×1 IMPLANT
SUT SILK 2-0 18XBRD TIE BLK (SUTURE) ×1 IMPLANT
SUT SILK 3 0 (SUTURE) ×2
SUT SILK 3 0 TIES 17X18 (SUTURE) ×2
SUT SILK 3-0 18XBRD TIE 12 (SUTURE) ×1 IMPLANT
SUT SILK 3-0 18XBRD TIE BLK (SUTURE) ×1 IMPLANT
SUT VIC AB 2-0 CT1 27 (SUTURE) ×8
SUT VIC AB 2-0 CT1 TAPERPNT 27 (SUTURE) ×5 IMPLANT
SUT VIC AB 3-0 SH 27 (SUTURE) ×4
SUT VIC AB 3-0 SH 27X BRD (SUTURE) ×2 IMPLANT
TOWEL GREEN STERILE (TOWEL DISPOSABLE) ×2 IMPLANT
TOWEL GREEN STERILE FF (TOWEL DISPOSABLE) ×1 IMPLANT
TOWEL SURG RFD BLUE STRL DISP (DISPOSABLE) ×2 IMPLANT
TRAY FOLEY MTR SLVR 16FR STAT (SET/KITS/TRAYS/PACK) ×2 IMPLANT
WATER STERILE IRR 1000ML POUR (IV SOLUTION) ×4 IMPLANT

## 2020-04-17 NOTE — Anesthesia Procedure Notes (Signed)
Procedure Name: Intubation Date/Time: 04/17/2020 7:48 AM Performed by: Reece Agar, CRNA Pre-anesthesia Checklist: Patient identified, Emergency Drugs available, Suction available and Patient being monitored Patient Re-evaluated:Patient Re-evaluated prior to induction Oxygen Delivery Method: Circle System Utilized Preoxygenation: Pre-oxygenation with 100% oxygen Induction Type: IV induction Ventilation: Mask ventilation without difficulty Laryngoscope Size: Mac and 4 Grade View: Grade I Tube type: Oral Tube size: 7.5 mm Number of attempts: 1 Airway Equipment and Method: Stylet and Oral airway Placement Confirmation: ETT inserted through vocal cords under direct vision,  positive ETCO2 and breath sounds checked- equal and bilateral Secured at: 22 cm Tube secured with: Tape Dental Injury: Teeth and Oropharynx as per pre-operative assessment

## 2020-04-17 NOTE — Anesthesia Procedure Notes (Signed)
Arterial Line Insertion Start/End10/19/2021 6:55 AM, 04/17/2020 7:05 AM  Patient location: Pre-op. Preanesthetic checklist: patient identified, IV checked, site marked, risks and benefits discussed, surgical consent, monitors and equipment checked, pre-op evaluation, timeout performed and anesthesia consent Lidocaine 1% used for infiltration Left, radial was placed Catheter size: 20 G Hand hygiene performed , maximum sterile barriers used  and Seldinger technique used Allen's test indicative of satisfactory collateral circulation Attempts: 1 Procedure performed without using ultrasound guided technique. Following insertion, dressing applied and Biopatch. Post procedure assessment: normal and unchanged  Patient tolerated the procedure well with no immediate complications.

## 2020-04-17 NOTE — Interval H&P Note (Signed)
History and Physical Interval Note:  04/17/2020 7:26 AM  Carlos Olson  has presented today for surgery, with the diagnosis of ABDOMINAL AORTIC ANEURYSM.  The various methods of treatment have been discussed with the patient and family. After consideration of risks, benefits and other options for treatment, the patient has consented to  Procedure(s): AORTA BIFEMORAL BYPASS GRAFT (N/A) as a surgical intervention.  The patient's history has been reviewed, patient examined, no change in status, stable for surgery.  I have reviewed the patient's chart and labs.  Questions were answered to the patient's satisfaction.     [name]

## 2020-04-17 NOTE — Transfer of Care (Signed)
Immediate Anesthesia Transfer of Care Note  Patient: Carlos Olson  Procedure(s) Performed: AORTA BIFEMORAL BYPASS GRAFT USING A HEMASHIELD GOLD BIFURCATED GRAFT (N/A )  Patient Location: PACU  Anesthesia Type:General  Level of Consciousness: awake and alert   Airway & Oxygen Therapy: Patient Spontanous Breathing and Patient connected to face mask oxygen  Post-op Assessment: Report given to RN and Post -op Vital signs reviewed and stable  Post vital signs: Reviewed and stable  Last Vitals:  Vitals Value Taken Time  BP 121/71 04/17/20 1352  Temp    Pulse 100 04/17/20 1358  Resp 19 04/17/20 1358  SpO2 99 % 04/17/20 1358  Vitals shown include unvalidated device data.  Last Pain:  Vitals:   04/17/20 0619  TempSrc:   PainSc: 5          Complications: No complications documented.

## 2020-04-17 NOTE — Anesthesia Postprocedure Evaluation (Signed)
Anesthesia Post Note  Patient: Carlos Olson  Procedure(s) Performed: AORTA BIFEMORAL BYPASS GRAFT USING A HEMASHIELD GOLD BIFURCATED GRAFT (N/A )     Patient location during evaluation: PACU Anesthesia Type: General Level of consciousness: awake and alert Pain management: pain level controlled Vital Signs Assessment: post-procedure vital signs reviewed and stable Respiratory status: spontaneous breathing, nonlabored ventilation, respiratory function stable and patient connected to nasal cannula oxygen Cardiovascular status: blood pressure returned to baseline and stable Postop Assessment: no apparent nausea or vomiting Anesthetic complications: no   No complications documented.  Last Vitals:  Vitals:   04/17/20 1450 04/17/20 1505  BP:  101/60  Pulse:  93  Resp: 20 20  Temp:    SpO2:  93%    Last Pain:  Vitals:   04/17/20 1505  TempSrc:   PainSc: 5                  Terran Klinke P [place]

## 2020-04-17 NOTE — Op Note (Signed)
Patient name: Carlos Olson MRN: [id number] DOB: [birth year] Sex: male  04/17/2020 Pre-operative Diagnosis: Abdominal aortic aneurysm, bilateral common iliac artery aneurysms, right hypogastric artery aneurysm, right common femoral artery aneurysm Post-operative diagnosis:  Same Surgeon:  Eda Paschal. Donzetta Matters, MD Assistant:  Laurence Slate, PA Procedure Performed: Open abdominal and common iliac artery aneurysm and right common femoral artery aneurysm repair with infrarenal aorta to left hypogastric and transposition of the left external iliac artery bypass and right common femoral artery bypass with 16 x 8 mm Dacron  Indications: 79 year old male with history of the above-noted aneurysms.  We have previously coiled his right hypogastric artery aneurysm.  He is now indicated for open repair of his abdominal aortic as well as bilateral common iliac artery and right common femoral artery aneurysms.  An assistant was necessary for assistance with retraction, suction, suture anastomosis and closure.  Findings: There was a large aneurysm in the infrarenal position.  Renal arteries were large there was an accessory renal artery this was ligated prior to clamping.  The IMA at completion did have backbleeding this was ligated.  There was a large left common iliac artery aneurysm that could neither be oversewn nor bypass to to allow flow in the hypogastric artery.  For this reason we performed an aorto to hypogastric artery bypass on the left transposed the left external iliac artery onto the bypass graft and after this there was a very strong palpable common femoral pulse on the left there were good signals at the foot at completion.  On the right side the previously coiled hypogastric artery was thrombosed.  We oversewed the external iliac artery.  The common femoral artery aneurysm was large appeared to be a saccular type aneurysm.  We oversewed the external iliac artery in the groin as well.  We bypass to a  spatulated common trunk of the profunda femoris and SFA arteries.  At completion there was very good signals at the foot after this.  There are also strong signals in the renal arteries bilaterally.  Patient tolerated procedure well and was extubated prior to transfer to the recovery room.   Procedure:  The patient was identified in the holding area and taken to the operating room where is placed supine on the operative when general anesthesia was induced.  He was sterilely prepped and draped in the abdomen bilateral lower extremities in the usual fashion antibiotics were administered and a timeout was called.  We began with longitudinal incision over the very large palpable common femoral pulse.  We dissected down we identified a very large aneurysm.  We dissected up onto the inguinal ligament divided the crossing vein.  We placed a vessel loop around a very large external iliac artery.  We dissected out the aneurysm further down to 2 very large profunda branches and placed Vesseloops around these as well as around the SFA.  We begun a tunnel under the inguinal ligament.  We then made a midline incision in the abdomen from xiphoid to pubis.  We dissected down open the midline fascia.  We entered the abdomen in the cephalad aspect of the incision.  We open the incision for the entirety of the length.  We examined the bowel which all appeared to be intact.  We divided our ligament of Treitz moved our bowel to the right side placed a self-retaining retractor.  We then opened the retroperitoneum onto the aneurysm.  We identified the IMA.  A few branches of this did have to  be ligated to facilitate retraction.  We also ligated the IMV.  We identified our crossing left renal vein this was dissected free.  We identified our renal arteries.  There was an accessory renal artery on the right this was ligated to facilitate exposure.  We dissected down to the bifurcation.  We initially dissected out the right side down to  the common iliac artery we identified our ureter this was protected.  We dissected into the external leg artery placed a vessel loop around this.  There is no pulsatility in the hypogastric artery on the right which we had previously coil embolized.  We turned our attention to the left side dissecting down onto the aneurysm there.  We dissected out the hypogastric artery for several centimeters past the bifurcation as it was quite tortuous.  It was very aneurysmal all the way to the bifurcation I did not think I could either oversew this and get flow to the hypogastric artery I also could not so do a common trunk there.  I therefore placed Vesseloops around the hypogastric artery as well.  We protected her ureter.  We then measured our neck of her aneurysm decided on a 16 x 8 mm Dacron graft.  We created a tunnel in the groin placed in umbilical tape there.  I also dissected the neck and the aneurysm free and placed in umbilical tape around the as well.  Patient was fully heparinized.  I did have to give an additional dose of heparin as well as an additional dose of antibiotics during the course of this operation.  We then clamped our bilateral external iliac arteries in the left hypogastric artery.  Following this we clamped the neck of the aneurysm.  We open this aneurysm longitudinally initially with 11 blade followed by scissors.  We had significant amounts of thrombus which was all removed.  We oversewed lumbar arteries with silk suture.  I transected a few centimeters distal to the neck and it all appeared healthy.  I open down into the right common iliac artery.  When I reached healthy appearing tissue at the distal common iliac artery right at the bifurcation there was no backbleeding from the hypogastric artery.  I did have backbleeding from the external iliac artery.  I then oversewed the them as a common trunk with 3-0 Prolene suture.  I then released the clamp on the right external leg artery and I had  no backbleeding.  I did clamped the SFA at this time given concern for embolization of aneurysmal debris.  We then opened down from the left common iliac artery down to the common iliac artery bifurcation on the left.  There was no suitable tissue there for sewing 1 anastomosis.  I then transected the hypogastric artery transected the external leg arteries.  I did have to endarterectomized the external iliac artery.  I then measured out the neck of the aneurysm and brought my 66mm graft in place trimmed the neck to size.  I sewed the graft and and with 3-0 Prolene suture.  Prior to completion we released our aortic clamp.  We did place a few repair sutures however we did quickly obtain hemostasis.  We clamped our limbs.  These were then flushed with heparinized saline.  I tunneled the right-sided limb deep to the ureter.  On the left side I straightened it out.  I plan for bypass to the hypogastric artery first and the graft was trimmed to size.  I then sewed the  graft and and with 4-0 Prolene suture to the hypogastric artery.  I then released my clamps left flow into the hypogastric artery.  After a few minutes we prepared our external leg artery and then marked on the graft where it open.  I reclamped the hypogastric artery as well as the graft limb.  I opened and removed a piece of the graft and sewed the external iliac artery after spatulated end-to-side to the graft with 4-0 Prolene suture.  Prior to completion allowed backbleeding is well antegrade bleeding flushed with heparinized saline.  We first opened in the hypogastric artery after surgery several cardiac cycles we opened into the left external iliac artery.  We had a palpable common femoral pulse at the groin.  Satisfied with this we packed the left lower quadrant turned our attention to the right groin.  We pulled our graft on tension.  All of our retractors were released down to allow blood flow to reach all the bowel which did all appear healthy.  I  then clamped my SFA and profunda femoris arteries as well as my external leg artery in the groin.  I opened the aneurysmal vessel longitudinally.  It appeared that this was a saccular aneurysm off the sidewall on the medial aspect of the common femoral artery.  I transected the external iliac artery oversewed this with 3-0 Prolene suture.  I then transected the distal common femoral artery and created 1 spatulated area including 2 profunda branches and the SFA.  I then trimmed the graft to size and spatulated and sewed and and to the spatulated area with 4-0 Prolene suture.  Prior completion allowed backbleeding and antegrade bleeding.  I then released my clamp.  The patient did tolerate this well we did have to 50% clamp our graft to allow anesthesia to catch up.  Ultimately he did very well.  There was a repair suture that was needed.  We then had very good signals in our feet.  We returned to the abdomen we inspected all of her anastomoses.  We administered 40 mg of protamine.  We obtain hemostasis and thoroughly irrigated the abdomen.  We tested our IMA and there was decent backbleeding and this was ligated with 2-0 silk suture just at the takeoff from the aneurysm.  We did have good signals in our renal arteries bilaterally as well as her hypogastric artery and external leg artery on the left.  There was no signal in the hypogastric artery on the right consistent with previous embolization.  We did have good signals at the foot checked by the OR nurse.  We have closed the retroperitoneum over the graft.  All bowel was inspected was returned to the abdomen the omentum was pulled over top of this.  The NG was confirmed in good position.  We then closed the midline fascia with running Prolene suture and then the skin with 4 Monocryl.  The groin was reinspected thoroughly irrigated closed in layers of Vicryl and Monocryl.  Dermabond is placed at both incision sites.  He was then awakened from anesthesia having  tolerated procedure without any complication.  All counts were correct at completion.  EBL:1800cc   Transfusion: 750 cc Cell Saver    Brigg Cape C. Donzetta Matters, MD [facility] Office: [phone] Pager: [phone]

## 2020-04-18 ENCOUNTER — Inpatient Hospital Stay (HOSPITAL_COMMUNITY): Payer: Medicare HMO

## 2020-04-18 ENCOUNTER — Encounter (HOSPITAL_COMMUNITY): Payer: Self-pay | Admitting: Vascular Surgery

## 2020-04-18 DIAGNOSIS — N179 Acute kidney failure, unspecified: Secondary | ICD-10-CM | POA: Diagnosis not present

## 2020-04-18 DIAGNOSIS — Z9889 Other specified postprocedural states: Secondary | ICD-10-CM | POA: Diagnosis not present

## 2020-04-18 DIAGNOSIS — I959 Hypotension, unspecified: Secondary | ICD-10-CM

## 2020-04-18 DIAGNOSIS — R410 Disorientation, unspecified: Secondary | ICD-10-CM | POA: Diagnosis not present

## 2020-04-18 LAB — COMPREHENSIVE METABOLIC PANEL
ALT: 14 U/L (ref 0–44)
AST: 28 U/L (ref 15–41)
Albumin: 2.9 g/dL — ABNORMAL LOW (ref 3.5–5.0)
Alkaline Phosphatase: 34 U/L — ABNORMAL LOW (ref 38–126)
Anion gap: 11 (ref 5–15)
BUN: 29 mg/dL — ABNORMAL HIGH (ref 8–23)
CO2: 16 mmol/L — ABNORMAL LOW (ref 22–32)
Calcium: 8.1 mg/dL — ABNORMAL LOW (ref 8.9–10.3)
Chloride: 115 mmol/L — ABNORMAL HIGH (ref 98–111)
Creatinine, Ser: 2.42 mg/dL — ABNORMAL HIGH (ref 0.61–1.24)
GFR, Estimated: 24 mL/min — ABNORMAL LOW (ref >60)
Glucose, Bld: 155 mg/dL — ABNORMAL HIGH (ref 70–99)
Potassium: 5.1 mmol/L (ref 3.5–5.1)
Sodium: 142 mmol/L (ref 135–145)
Total Bilirubin: 0.5 mg/dL (ref 0.3–1.2)
Total Protein: 4.8 g/dL — ABNORMAL LOW (ref 6.5–8.1)

## 2020-04-18 LAB — CBC
HCT: 32.6 % — ABNORMAL LOW (ref 39.0–52.0)
HCT: 29.8 % — ABNORMAL LOW (ref 39.0–52.0)
Hemoglobin: 10.2 g/dL — ABNORMAL LOW (ref 13.0–17.0)
Hemoglobin: 9.4 g/dL — ABNORMAL LOW (ref 13.0–17.0)
MCH: 33.1 pg (ref 26.0–34.0)
MCH: 33.6 pg (ref 26.0–34.0)
MCHC: 31.3 g/dL (ref 30.0–36.0)
MCHC: 31.5 g/dL (ref 30.0–36.0)
MCV: 105.8 fL — ABNORMAL HIGH (ref 80.0–100.0)
MCV: 106.4 fL — ABNORMAL HIGH (ref 80.0–100.0)
Platelets: 147 10*3/uL — ABNORMAL LOW (ref 150–400)
Platelets: 128 10*3/uL — ABNORMAL LOW (ref 150–400)
RBC: 3.08 MIL/uL — ABNORMAL LOW (ref 4.22–5.81)
RBC: 2.8 MIL/uL — ABNORMAL LOW (ref 4.22–5.81)
RDW: 13.6 % (ref 11.5–15.5)
RDW: 13.5 % (ref 11.5–15.5)
WBC: 17.1 10*3/uL — ABNORMAL HIGH (ref 4.0–10.5)
WBC: 18.4 10*3/uL — ABNORMAL HIGH (ref 4.0–10.5)
nRBC: 0 % (ref 0.0–0.2)
nRBC: 0 % (ref 0.0–0.2)

## 2020-04-18 LAB — POCT I-STAT 7, (LYTES, BLD GAS, ICA,H+H)
Acid-base deficit: 9 mmol/L — ABNORMAL HIGH (ref 0.0–2.0)
Acid-base deficit: 8 mmol/L — ABNORMAL HIGH (ref 0.0–2.0)
Bicarbonate: 16.7 mmol/L — ABNORMAL LOW (ref 20.0–28.0)
Bicarbonate: 16.8 mmol/L — ABNORMAL LOW (ref 20.0–28.0)
Calcium, Ion: 1.14 mmol/L — ABNORMAL LOW (ref 1.15–1.40)
Calcium, Ion: 1.13 mmol/L — ABNORMAL LOW (ref 1.15–1.40)
HCT: 30 % — ABNORMAL LOW (ref 39.0–52.0)
HCT: 27 % — ABNORMAL LOW (ref 39.0–52.0)
Hemoglobin: 10.2 g/dL — ABNORMAL LOW (ref 13.0–17.0)
Hemoglobin: 9.2 g/dL — ABNORMAL LOW (ref 13.0–17.0)
O2 Saturation: 91 %
O2 Saturation: 90 %
Patient temperature: 97.5
Patient temperature: 98.8
Potassium: 4.7 mmol/L (ref 3.5–5.1)
Potassium: 5 mmol/L (ref 3.5–5.1)
Sodium: 144 mmol/L (ref 135–145)
Sodium: 144 mmol/L (ref 135–145)
TCO2: 18 mmol/L — ABNORMAL LOW (ref 22–32)
TCO2: 18 mmol/L — ABNORMAL LOW (ref 22–32)
pCO2 arterial: 32.6 mmHg (ref 32.0–48.0)
pCO2 arterial: 32.3 mmHg (ref 32.0–48.0)
pH, Arterial: 7.313 — ABNORMAL LOW (ref 7.350–7.450)
pH, Arterial: 7.323 — ABNORMAL LOW (ref 7.350–7.450)
pO2, Arterial: 64 mmHg — ABNORMAL LOW (ref 83.0–108.0)
pO2, Arterial: 64 mmHg — ABNORMAL LOW (ref 83.0–108.0)

## 2020-04-18 LAB — AMYLASE: Amylase: 26 U/L — ABNORMAL LOW (ref 28–100)

## 2020-04-18 LAB — BASIC METABOLIC PANEL
Anion gap: 10 (ref 5–15)
BUN: 33 mg/dL — ABNORMAL HIGH (ref 8–23)
CO2: 17 mmol/L — ABNORMAL LOW (ref 22–32)
Calcium: 7.8 mg/dL — ABNORMAL LOW (ref 8.9–10.3)
Chloride: 115 mmol/L — ABNORMAL HIGH (ref 98–111)
Creatinine, Ser: 2.81 mg/dL — ABNORMAL HIGH (ref 0.61–1.24)
GFR, Estimated: 20 mL/min — ABNORMAL LOW (ref >60)
Glucose, Bld: 128 mg/dL — ABNORMAL HIGH (ref 70–99)
Potassium: 5 mmol/L (ref 3.5–5.1)
Sodium: 142 mmol/L (ref 135–145)

## 2020-04-18 LAB — BRAIN NATRIURETIC PEPTIDE: B Natriuretic Peptide: 140.8 pg/mL — ABNORMAL HIGH (ref 0.0–100.0)

## 2020-04-18 LAB — MAGNESIUM: Magnesium: 1.5 mg/dL — ABNORMAL LOW (ref 1.7–2.4)

## 2020-04-18 MED ORDER — LORAZEPAM 2 MG/ML IJ SOLN
1.0000 mg | Freq: Four times a day (QID) | INTRAMUSCULAR | Status: DC | PRN
  Administered 2020-04-18: 1 mg via INTRAVENOUS
  Filled 2020-04-18: qty 1

## 2020-04-18 MED FILL — Sodium Chloride IV Soln 0.9%: INTRAVENOUS | Qty: 1000 | Status: AC

## 2020-04-18 MED FILL — Sodium Chloride Irrigation Soln 0.9%: Qty: 3000 | Status: AC

## 2020-04-18 MED FILL — Heparin Sodium (Porcine) Inj 1000 Unit/ML: INTRAMUSCULAR | Qty: 30 | Status: AC

## 2020-04-18 NOTE — Evaluation (Signed)
Physical Therapy Evaluation Patient Details Name: Carlos Olson MRN: [id number] DOB: 12/12/1940 Today's Date: 04/18/2020   History of Present Illness  Pt admit with  Abdominal aortic aneurysm, bilateral common iliac artery aneurysms, right hypogastric artery aneurysm, right common femoral artery aneurysm.  Underwent aortobifemoral BPG.    Clinical Impression  Pt admitted with above diagnosis. Pt was able tostand and pivot to recliner with incr time to move as pt with incr pain and has to prepare himself mentally prior to each movement.  Pt does not need a lot of physical assist but needs incr time to mobilize due to pain and mental prep.   Pt currently with functional limitations due to the deficits listed below (see PT Problem List). Pt will benefit from skilled PT to increase their independence and safety with mobility to allow discharge to the venue listed below.      Follow Up Recommendations Home health PT;Supervision/Assistance - 24 hour    Equipment Recommendations  3in1 (PT)    Recommendations for Other Services       Precautions / Restrictions Precautions Precautions: Fall Restrictions Weight Bearing Restrictions: No      Mobility  Bed Mobility Overal bed mobility: Needs Assistance Bed Mobility: Rolling;Sidelying to Sit Rolling: Min assist Sidelying to sit: Min assist;+2 for physical assistance       General bed mobility comments: Cues for technique and assist to come to eOB    Transfers Overall transfer level: Needs assistance Equipment used: Rolling walker (2 wheeled) Transfers: Sit to/from [facility] Sit to Stand: Min assist;Min guard;+2 safety/equipment Stand pivot transfers: Min guard;+2 safety/equipment       General transfer comment: Pt asking for PT to not physicall asssist him. Did give a little nudge on first attempt to stand,  Pt c/o dizziness therefore pt sat back down and then after second stand was able to take pivotal steps to  chair.    Ambulation/Gait                Stairs            Wheelchair Mobility    Modified Rankin (Stroke Patients Only)       Balance                                             Pertinent Vitals/Pain Pain Assessment: 0-10 Pain Score: 9  Pain Location: groin bilaterally Pain Descriptors / Indicators: Aching;Grimacing;Guarding Pain Intervention(s): Limited activity within patient's tolerance;Monitored during session;Repositioned;Patient requesting pain meds-RN notified    Home Living Family/patient expects to be discharged to:: Private residence Living Arrangements: Spouse/significant other Available Help at Discharge: Family;Available 24 hours/day Type of Home: House Home Access: Level entry     Home Layout: One level Home Equipment: Walker - 2 wheels;Grab bars - tub/shower;Adaptive equipment      Prior Function Level of Independence: Independent               Hand Dominance   Dominant Hand: Right    Extremity/Trunk Assessment   Upper Extremity Assessment Upper Extremity Assessment: Defer to OT evaluation    Lower Extremity Assessment Lower Extremity Assessment: Generalized weakness    Cervical / Trunk Assessment Cervical / Trunk Assessment: Normal  Communication   Communication: No difficulties  Cognition Arousal/Alertness: Awake/alert Behavior During Therapy: Flat affect Overall Cognitive Status: Within Functional Limits for tasks assessed  General Comments General comments (skin integrity, edema, etc.): 115 bpm- 158 bpm at highest point with nurse notified that HR elevated at end of session, 116/72, 93% 3LO2.  BP remained stable.     Exercises General Exercises - Lower Extremity Ankle Circles/Pumps: AROM;Both;10 reps;Seated Long Arc Quad: AROM;Both;10 reps;Seated   Assessment/Plan    PT Assessment Patient needs continued PT services  PT Problem List  Decreased activity tolerance;Decreased balance;Decreased mobility;Decreased knowledge of use of DME;Decreased safety awareness;Decreased knowledge of precautions;Pain       PT Treatment Interventions DME instruction;Gait training;Functional mobility training;Therapeutic activities;Therapeutic exercise;Balance training;Patient/family education    PT Goals (Current goals can be found in the Care Plan section)  Acute Rehab PT Goals Patient Stated Goal: to go home PT Goal Formulation: With patient Time For Goal Achievement: 05/02/20 Potential to Achieve Goals: Good    Frequency Min 3X/week   Barriers to discharge        Co-evaluation PT/OT/SLP Co-Evaluation/Treatment: Yes Reason for Co-Treatment: Complexity of the patient's impairments (multi-system involvement);For patient/therapist safety PT goals addressed during session: Mobility/safety with mobility         AM-PAC PT "6 Clicks" Mobility  Outcome Measure Help needed turning from your back to your side while in a flat bed without using bedrails?: A Lot Help needed moving from lying on your back to sitting on the side of a flat bed without using bedrails?: A Lot Help needed moving to and from a bed to a chair (including a wheelchair)?: A Lot Help needed standing up from a chair using your arms (e.g., wheelchair or bedside chair)?: A Little Help needed to walk in hospital room?: Total Help needed climbing 3-5 steps with a railing? : Total 6 Click Score: 11    End of Session Equipment Utilized During Treatment: Gait belt;Oxygen Activity Tolerance: Patient limited by fatigue Patient left: in chair;with call bell/phone within reach;with family/visitor present Nurse Communication: Mobility status PT Visit Diagnosis: Muscle weakness (generalized) (M62.81);Pain Pain - part of body:  (back and groin)    Time: 6803-2122 PT Time Calculation (min) (ACUTE ONLY): 39 min   Charges:   PT Evaluation $PT Eval Moderate Complexity: 1  Mod PT Treatments $Therapeutic Activity: 8-22 mins        Xane Amsden W,PT Acute Rehabilitation Services Pager:  [phone]  Office:  [phone]    Denice Paradise 04/18/2020, 12:57 PM

## 2020-04-18 NOTE — Progress Notes (Addendum)
Critical Care Note    04/18/2020 8:03 AM 1 Day Post-Op  Subjective:  Complaining of intermittent, brief spasmodic midline incisional pain. Says he is passing flatus. Wife at bedside.  Discussed with RN. Requiring phenylephrine. Labs need to be drawn. Moderate UOP    Vitals:   04/18/20 0700 04/18/20 0800  BP: (!) 116/97 (!) 116/97  Pulse: 99 99  Resp: (!) 24 (!) 22  Temp:    SpO2: 23% 76%   Systolic BP: 283T-517O (a-line) HR: 90s-100s (tele>NSR) UOP: 635cc, amber in color  Physical Exam: Cardiac:  Slight increased rate; regular rythym Lungs:  CTAB Incisions:  Well approximated. + ecchymosis Extremities:  Moves all well. Brisk PT Doppler signals bil Abdomen:  ND, occasional BS  CBC    Component Value Date/Time   WBC 14.3 (H) 04/17/2020 1357   RBC 3.33 (L) 04/17/2020 1357   HGB 10.5 (L) 04/17/2020 2031   HGB 12.1 (L) 08/20/2012 0610   HCT 31.0 (L) 04/17/2020 2031   HCT 44.0 08/02/2012 0853   PLT 159 04/17/2020 1357   PLT 158 08/20/2012 0610   MCV 105.7 (H) 04/17/2020 1357   MCV 99 08/02/2012 0853   MCH 33.9 04/17/2020 1357   MCHC 32.1 04/17/2020 1357   RDW 13.1 04/17/2020 1357   RDW 13.7 08/02/2012 0853   LYMPHSABS 1.1 01/25/2019 0818   MONOABS 0.7 01/25/2019 0818   EOSABS 0.2 01/25/2019 0818   BASOSABS 0.1 01/25/2019 0818    BMET    Component Value Date/Time   NA 143 04/17/2020 2031   NA 136 08/20/2012 0610   K 5.4 (H) 04/17/2020 2031   K 4.2 08/20/2012 0610   CL 111 04/17/2020 1357   CL 102 08/20/2012 0610   CO2 20 (L) 04/17/2020 1357   CO2 26 08/20/2012 0610   GLUCOSE 202 (H) 04/17/2020 1357   GLUCOSE 123 (H) 08/20/2012 0610   BUN 18 04/17/2020 1357   BUN 7 08/20/2012 0610   CREATININE 1.19 04/17/2020 1357   CREATININE 0.75 08/20/2012 0610   CALCIUM 8.7 (L) 04/17/2020 1357   CALCIUM 8.4 (L) 08/20/2012 0610   GFRNONAA 58 (L) 04/17/2020 1357   GFRNONAA >60 08/20/2012 0610   GFRAA >60 05/07/2017 0949   GFRAA >60 08/20/2012 0610      Intake/Output Summary (Last 24 hours) at 04/18/2020 0803 Last data filed at 04/18/2020 0700 Gross per 24 hour  Intake 5910.89 ml  Output 2435 ml  Net 3475.89 ml    HOSPITAL MEDICATIONS Scheduled Meds: . aspirin  81 mg Per Tube Daily  . atorvastatin  10 mg Per Tube QHS  . Chlorhexidine Gluconate Cloth  6 each Topical Daily  . fluticasone furoate-vilanterol  1 puff Inhalation Daily  . heparin  5,000 Units Subcutaneous Q8H  . mouth rinse  15 mL Mouth Rinse BID  . pantoprazole sodium  40 mg Per Tube Daily   Continuous Infusions: . sodium chloride    . sodium chloride Stopped (04/17/20 1643)  . dextrose 5 % and 0.9% NaCl 125 mL/hr at 04/18/20 0700  . magnesium sulfate bolus IVPB    . phenylephrine (NEO-SYNEPHRINE) Adult infusion 22 mcg/min (04/18/20 0700)   PRN Meds:.sodium chloride, acetaminophen **OR** acetaminophen, alum & mag hydroxide-simeth, bisacodyl, guaiFENesin-dextromethorphan, hydrALAZINE, HYDROmorphone (DILAUDID) injection, labetalol, magnesium sulfate bolus IVPB, metoprolol tartrate, ondansetron, phenol, potassium chloride, sodium phosphate  Systems Assessment: Hemodynamics: requriing phenylephrine at 22 mcg/min Pulmonary: normal SaO2 on 2L via [place]. CXR unremarkable Renal: moderate UOP;  IVFs at 125cc/hr Vascular: LE well perfused ID: Afeb.  No infiltrates on CXR. No signs of incisional infection Nutrition: NPO> NGT to LCWS;  PPI prophy    Plan: continue vassopressor; supportive care. Check am labs results/SCr.  +/- IVF bolus for modest UOP. Continue art line and NGT today. -DVT prophylaxis:  Heparin [place]   Risa Grill, PA-C [facility] [phone] 04/18/2020  8:03 AM  I have independently interviewed and examined patient and agree with PA assessment and plan above. Gentle resuscitation this morning. H&H is stable. CMP pending. Out of bed today. Clamp NG tube.  Lakeishia Trul[place] C. Donzetta Matters, MD [facility] [place] Office:  [phone] Pager: [phone]

## 2020-04-18 NOTE — Evaluation (Signed)
Occupational Therapy Evaluation Patient Details Name: Carlos Olson MRN: [id number] DOB: [birth year] Today's Date: 04/18/2020    History of Present Illness Pt admit with abdominal aortic aneurysm, bilateral common iliac artery aneurysms, right hypogastric artery aneurysm, right common femoral artery aneurysm.  Underwent aortobifemoral BPG.     Clinical Impression   Pt was independent prior to admission. Presents with impaired cognition, pain and impaired standing balance. He moves slow and methodically, but with minimum assistance and second person for safety and lines. Pt requires min to max assist for ADL. He will likely progress well to return home with his supportive wife who participated in session. Will follow acutely.    Follow Up Recommendations  Home health OT;Supervision/Assistance - 24 hour    Equipment Recommendations  3 in 1 bedside commode    Recommendations for Other Services       Precautions / Restrictions Precautions Precautions: Fall Precaution Comments: NGT      Mobility Bed Mobility Overal bed mobility: Needs Assistance Bed Mobility: Rolling;Sidelying to Sit Rolling: Min assist Sidelying to sit: Min assist;+2 for physical assistance       General bed mobility comments: Cues for technique and assist, pulled on therapist's hand to roll, assist to raise trunk    Transfers Overall transfer level: Needs assistance Equipment used: Rolling walker (2 wheeled) Transfers: Sit to/from [facility] Sit to Stand: Min assist;Min guard;+2 safety/equipment Stand pivot transfers: Min guard;+2 safety/equipment       General transfer comment: prefers to not have assistance, but needing min assist to rise and steady and cues for hand placement, completed from EOB x 2.    Balance Overall balance assessment: Needs assistance   Sitting balance-Leahy Scale: Fair     Standing balance support: Bilateral upper extremity supported Standing  balance-Leahy Scale: Poor Standing balance comment: reliant of RW                           ADL either performed or assessed with clinical judgement   ADL Overall ADL's : Needs assistance/impaired Eating/Feeding: Minimal assistance;Sitting (ice chips with spoon)   Grooming: Minimal assistance;Sitting   Upper Body Bathing: Moderate assistance;Sitting   Lower Body Bathing: Maximal assistance;Sit to/from stand   Upper Body Dressing : Minimal assistance;Sitting   Lower Body Dressing: Maximal assistance;Sit to/from stand   Toilet Transfer: Minimal assistance;+2 for safety/equipment;Stand-pivot;BSC;RW   Toileting- Clothing Manipulation and Hygiene: Maximal assistance;Sit to/from stand               Vision Patient Visual Report: No change from baseline       Perception     Praxis      Pertinent Vitals/Pain Pain Assessment: Faces Pain Score: 9  Pain Location: groin bilaterally, abdomen Pain Descriptors / Indicators: Aching;Grimacing;Guarding Pain Intervention(s): Monitored during session;Repositioned     Hand Dominance Right   Extremity/Trunk Assessment Upper Extremity Assessment Upper Extremity Assessment: Overall WFL for tasks assessed   Lower Extremity Assessment Lower Extremity Assessment: Defer to PT evaluation   Cervical / Trunk Assessment Cervical / Trunk Assessment: Normal   Communication Communication Communication: No difficulties   Cognition Arousal/Alertness: Awake/alert Behavior During Therapy: Flat affect Overall Cognitive Status: Impaired/Different from baseline Area of Impairment: Following commands;Memory;Attention;Problem solving                   Current Attention Level: Sustained Memory: Decreased short-term memory Following Commands: Follows one step commands with increased time     Problem Solving:  Slow processing;Decreased initiation;Difficulty sequencing;Requires verbal cues General Comments: pt requires  increased time to talk through each movement/activity   General Comments       Exercises     Shoulder Instructions      Home Living Family/patient expects to be discharged to:: Private residence Living Arrangements: Spouse/significant other Available Help at Discharge: Family;Available 24 hours/day Type of Home: House Home Access: Level entry     Home Layout: One level     Bathroom Shower/Tub: Occupational psychologist: Handicapped height     Home Equipment: Environmental consultant - 2 wheels;Grab bars - tub/shower;Adaptive equipment Adaptive Equipment: Reacher;Sock aid        Prior Functioning/Environment Level of Independence: Independent                 OT Problem List: Impaired balance (sitting and/or standing);Decreased strength;Pain;Decreased knowledge of use of DME or AE;Decreased cognition      OT Treatment/Interventions: Self-care/ADL training;DME and/or AE instruction;Patient/family education;Balance training;Cognitive remediation/compensation;Therapeutic activities    OT Goals(Current goals can be found in the care plan section) Acute Rehab OT Goals Patient Stated Goal: to go home OT Goal Formulation: With patient Time For Goal Achievement: 05/02/20 Potential to Achieve Goals: Good ADL Goals Pt Will Perform Grooming: with supervision;standing Pt Will Perform Lower Body Bathing: with min assist;sit to/from stand;with adaptive equipment Pt Will Perform Lower Body Dressing: with min assist;sit to/from stand;with adaptive equipment Pt Will Transfer to Toilet: with supervision;ambulating;bedside commode (over toilet) Pt Will Perform Toileting - Clothing Manipulation and hygiene: with supervision;sit to/from stand Additional ADL Goal #1: Pt will perform bed mobility with min assist in preparation for ADL.  OT Frequency: Min 2X/week   Barriers to D/C:            Co-evaluation PT/OT/SLP Co-Evaluation/Treatment: Yes Reason for Co-Treatment: For  patient/therapist safety;Complexity of the patient's impairments (multi-system involvement)   OT goals addressed during session: ADL's and self-care;Proper use of Adaptive equipment and DME      AM-PAC OT "6 Clicks" Daily Activity     Outcome Measure Help from another person eating meals?: A Little Help from another person taking care of personal grooming?: A Little Help from another person toileting, which includes using toliet, bedpan, or urinal?: A Lot Help from another person bathing (including washing, rinsing, drying)?: A Lot Help from another person to put on and taking off regular upper body clothing?: A Little Help from another person to put on and taking off regular lower body clothing?: A Lot 6 Click Score: 15   End of Session Equipment Utilized During Treatment: Rolling walker;Oxygen Nurse Communication: Mobility status  Activity Tolerance: Patient limited by pain Patient left: in chair;with call bell/phone within reach;with family/visitor present  OT Visit Diagnosis: Pain;Muscle weakness (generalized) (M62.81);Unsteadiness on feet (R26.81);Other abnormalities of gait and mobility (R26.89)                Time: 0383-3383 OT Time Calculation (min): 40 min Charges:  OT General Charges $OT Visit: 1 Visit OT Evaluation $OT Eval Moderate Complexity: 1 Mod  Nestor Lewandowsky, OTR/L Acute Rehabilitation Services Pager: [phone] Office: [phone]  Malka So 04/18/2020, 3:17 PM

## 2020-04-18 NOTE — Consult Note (Addendum)
Cardiology Consultation:   Patient ID: Carlos Olson; [id number]; 1940/11/14   Admit date: 04/17/2020 Date of Consult: 04/18/2020  Primary Care Provider: Ria Bush, MD Primary Cardiologist: Werner Lean, MD 03/30/2020 Primary Electrophysiologist:  None   Patient Profile:   Carlos Olson is a 79 y.o. 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  He was seen by Dr Gasper Sells 10/01, who got an echo and cleared him for surgery.   History of Present Illness:   Carlos Olson was admitted 10/19 for Open abdominal and common iliac artery aneurysm and right common femoral artery aneurysm repair with infrarenal aorta to left hypogastric and transposition of the left external iliac artery bypass and right common femoral artery bypass with 16 x 8 mm Dacron.   Overnight, he developed hypotension, requiring phenylephrine. I/O +3.5 L, but has good UOP. Cards asked to see.   Pt having some delusions, says he got in an argument with his wife because he wanted to go home today. He admits to SOB, but feels it is his COPD. Does not feel light-headed or dizzy. No awareness of rapid HR.  Has pain in his abd, severe at times. Seems to have a spasmodic component. No other c/o pain.   Phenylephrine peak 45 mcg/min, now down to 40 mcg/min, may be able to decrease again soon.   O2 sats 90% on O2, does not have home O2. Uses inhaler at home.   He is unaware of elevated HR or PACs, K+ ok, Mg 1.5 and supplemented.  Except for elevated resp rate and elevated HR, he is doing well from a cardiac standpoint.    Past Medical History:  Diagnosis Date  . AAA (abdominal aortic aneurysm) ([place])   . Atherosclerosis of aortic arch ([place]) 09/25/2015   By CT scan   . CAD  (coronary artery disease) 09/25/2015   Mild 3v by CT scan   . Cancer [facility])    prostate  . Colon polyps   . COPD (chronic obstructive pulmonary disease) ([place])   . Diverticulosis of colon   . Femoral bruit    bilateral, found by Dr. Jefm Bryant  . Heart murmur    child  . History of prostate cancer   . IBS (irritable bowel syndrome)    improved after retirement  . 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 Pelvic fracture ([place])   . Pneumonia    child  . Pulmonary nodules 09/25/2015   On screening CT scan, rpt 1 yr   . Shortness of breath    doe    Past Surgical History:  Procedure Laterality Date  . ANTERIOR CERVICAL DECOMP/DISCECTOMY FUSION  07/29/2011   Procedure: ANTERIOR CERVICAL DECOMPRESSION/DISCECTOMY FUSION 1 LEVEL;  Surgeon: Jessy Oto, MD;  Location: [place];  Service: Orthopedics;  Laterality: N/A;  Anterior Cervical Discectomy and Fusion C3-4  . AORTA - BILATERAL FEMORAL ARTERY BYPASS GRAFT N/A 04/17/2020   Procedure: AORTA BIFEMORAL BYPASS GRAFT USING A HEMASHIELD GOLD BIFURCATED GRAFT;  Surgeon: Waynetta Sandy, MD;  Location: [place];  Service: Vascular;  Laterality: N/A;  . CATARACT EXTRACTION W/PHACO Right 07/10/2015   Procedure: CATARACT EXTRACTION PHACO AND INTRAOCULAR LENS PLACEMENT ([place]);  Surgeon: Birder Robson, MD;  Location: [facility];  Service: Ophthalmology;  Laterality: Right;  [place] 00:58   . CATARACT EXTRACTION W/PHACO Left 07/24/2015   Procedure: CATARACT EXTRACTION PHACO AND INTRAOCULAR LENS PLACEMENT ([facility]);  Surgeon: Birder Robson, MD;  Location: [facility];  Service: Ophthalmology;  Laterality: Left;  [place] 00:38   . COLONOSCOPY  12/2007   [place], diverticulosis and polyp, rpt 5 yrs  . dental implants    . EMBOLIZATION Right 03/26/2020   Procedure: EMBOLIZATION;  Surgeon: Waynetta Sandy, MD;  Location: [place] [facility];  Service: Cardiovascular;  Laterality: Right;  . FORAMINOTOMY 2 LEVEL Left  10/2013   C4/5, C5/6 ([place])  . HERNIA REPAIR     right 05/1999, left 09/04/05  . JOINT REPLACEMENT Right    TKR  . KNEE ARTHROPLASTY Left 05/20/2017   Procedure: COMPUTER ASSISTED TOTAL KNEE ARTHROPLASTY;  Surgeon: Dereck Leep, MD;  Location: [facility];  Service: Orthopedics;  Laterality: Left;  . KNEE ARTHROSCOPY  09/1997   left  . POSTERIOR CERVICAL FUSION/FORAMINOTOMY N/A 11/22/2013   Procedure: LEFT C4-5 AND C5-6 FORAMINOTOMY;  Surgeon: Jessy Oto, MD;  Location: [place];  Service: Orthopedics;  Laterality: N/A;  . PROSTATECTOMY  08/1990  . 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 TONSILLECTOMY       Prior to Admission medications   Medication Sig Start Date End Date Taking? Authorizing Provider  acetaminophen (TYLENOL) 500 MG tablet Take 1,000 mg by mouth every 6 (six) hours as needed for moderate pain or headache.    Yes [provider]  aspirin EC 81 MG tablet Take 1 tablet (81 mg total) by mouth daily. 01/27/19  Yes Ria Bush, MD  Fluticasone-Umeclidin-Vilant (TRELEGY ELLIPTA) 100-62.5-25 MCG/INH AEPB Inhale 1 puff into the lungs daily. 11/25/19  Yes Lauraine Rinne, NP  Multiple Vitamin (MULTIVITAMIN WITH MINERALS) TABS tablet Take 1 tablet by mouth daily. Multivitamin for Men   Yes [provider]  albuterol (VENTOLIN HFA) 108 (90 Base) MCG/ACT inhaler Inhale 2 puffs into the lungs every 6 (six) hours as needed for wheezing or shortness of breath. 12/06/18   Fenton Foy, NP    Inpatient Medications: Scheduled Meds: . aspirin  81 mg Per Tube Daily  . atorvastatin  10 mg Per Tube QHS  . Chlorhexidine Gluconate Cloth  6 each Topical Daily  . fluticasone furoate-vilanterol  1 puff Inhalation Daily  . heparin  5,000 Units Subcutaneous Q8H  . mouth rinse  15 mL Mouth Rinse BID  . pantoprazole sodium  40 mg Per Tube Daily   Continuous Infusions: . sodium chloride Stopped (04/17/20 1643)  . dextrose 5 % and 0.9% NaCl 125 mL/hr at 04/18/20 0700  .  magnesium sulfate bolus IVPB    . phenylephrine (NEO-SYNEPHRINE) Adult infusion 45 mcg/min (04/18/20 1253)   PRN Meds: acetaminophen **OR** acetaminophen, alum & mag hydroxide-simeth, bisacodyl, guaiFENesin-dextromethorphan, hydrALAZINE, HYDROmorphone (DILAUDID) injection, labetalol, magnesium sulfate bolus IVPB, metoprolol tartrate, ondansetron, phenol, potassium chloride, sodium phosphate  Allergies:    Allergies  Allergen Reactions  . Levaquin [Levofloxacin]     Aortic Aneurysm  . Sulfonamide Derivatives Itching  . Sulfasalazine Itching    Social History:   Social History   Socioeconomic History  . Marital status: Married    Spouse name: Not on file  . Number of children: 2  . Years of education: Not on file  . Highest education level: Not on file  Occupational History  . Occupation: Medical illustrator, retired  Tobacco Use  . Smoking status:  Former Smoker    Packs/day: 2.00    Years: 52.00    Pack years: 104.00    Types: Cigarettes    Quit date: 05/15/2007    Years since quitting: 12.9  . Smokeless tobacco: Never Used  . Tobacco comment: quit 06/16/2007/encouraged to remain smoke free  Vaping Use  . Vaping Use: Never used  Substance and Sexual Activity  . Alcohol use: Yes    Alcohol/week: 14.0 standard drinks    Types: 14 Standard drinks or equivalent per week    Comment: 2 drinks a day  . Drug use: No  . Sexual activity: Never  Other Topics Concern  . Not on file  Social History Narrative   Prefers I call him "[name]"   Lives with wife. Grown children.    Occupation: retired, was in Insurance underwriter business    Activity: home exercise routine dialy   Diet: some water, vegetables daily    Social Determinants of Radio broadcast assistant Strain:   . Difficulty of Paying Living Expenses: Not on file  Food Insecurity:   . Worried About Charity fundraiser in the Last Year: Not on file  . Ran Out of Food in the Last Year: Not on file  Transportation Needs:   .  Lack of Transportation (Medical): Not on file  . Lack of Transportation (Non-Medical): Not on file  Physical Activity:   . Days of Exercise per Week: Not on file  . Minutes of Exercise per Session: Not on file  Stress:   . Feeling of Stress : Not on file  Social Connections:   . Frequency of Communication with Friends and Family: Not on file  . Frequency of Social Gatherings with Friends and Family: Not on file  . Attends Religious Services: Not on file  . Active Member of Clubs or Organizations: Not on file  . Attends Archivist Meetings: Not on file  . Marital Status: Not on file  Intimate Partner Violence:   . Fear of Current or Ex-Partner: Not on file  . Emotionally Abused: Not on file  . Physically Abused: Not on file  . Sexually Abused: Not on file    Family History:   Family History  Problem Relation Age of Onset  . Hypertension Mother   . Cancer Father        bone, prostate  . Diabetes Sister        x 40 years  . Cancer Brother        prostate   Family Status:  Family Status  Relation Name Status  . Mother  Deceased at age 40       HTN  . Father  Deceased at age 78       bone cancer, prostate cancer  . Sister  Deceased at age 72  . Brother  Alive       rectocysto fistula    ROS:  Please see the history of present illness.  All other ROS reviewed and negative.     Physical Exam/Data:   Vitals:   04/18/20 1100 04/18/20 1200 04/18/20 1300 04/18/20 1400  BP: 105/90 132/71 137/66 (!) 148/69  Pulse: (!) 115 (!) 119 (!) 109 (!) 111  Resp: (!) 36 (!) 34 (!) 27 (!) 40  Temp:      TempSrc:      SpO2: 93% 94% 95% 91%  Weight:      Height:        Intake/Output Summary (Last 24 hours)  at 04/18/2020 1501 Last data filed at 04/18/2020 0700 Gross per 24 hour  Intake 2260.89 ml  Output 235 ml  Net 2025.89 ml    Last 3 Weights 04/17/2020 04/11/2020 04/06/2020  Weight (lbs) 210 lb 210 lb 8 oz 212 lb  Weight (kg) 95.255 kg 95.482 kg 96.163 kg      Body mass index is 26.25 kg/m.   General:  Well nourished, well developed, male in no acute distress HEENT: normal Lymph: no adenopathy Neck: JVD - 9-10 cm Endocrine:  No thryomegaly Vascular: No carotid bruits; 4/4 extremity pulses 2+  Cardiac:  normal S1, S2; RRR; no murmur Lungs:  Dry rales bilaterally, no wheezing, rhonchi Abd: soft, extremely tender, no hepatomegaly, incision not disturbed Ext: no edema Musculoskeletal:  No deformities, BUE and BLE strength weak but equal Skin: warm and dry  Neuro:  CNs 2-12 intact, no focal abnormalities noted Psych:  Normal affect   EKG:  The EKG was personally reviewed and demonstrates:  10/1 ECG is SR, HR 68, high T waves Telemetry:  Telemetry was personally reviewed and demonstrates:  ST, HR 130s at times, PACs   CV studies:   ECHO: 04/13/2020 1. Left ventricular ejection fraction, by estimation, is 50%. The left  ventricle has mildly decreased function. The left ventricle demonstrates  global hypokinesis. Left ventricular diastolic parameters are consistent  with Grade I diastolic dysfunction  (impaired relaxation).  2. Right ventricular systolic function is normal. The right ventricular  size is normal. There is normal pulmonary artery systolic pressure. The  estimated right ventricular systolic pressure is 15.1 mmHg.  3. Left atrial size was moderately dilated.  4. Right atrial size was mildly dilated.  5. The mitral valve is normal in structure. No evidence of mitral valve  regurgitation. No evidence of mitral stenosis.  6. The aortic valve is tricuspid. Aortic valve regurgitation is trivial.  Mild aortic valve sclerosis is present, with no evidence of aortic valve  stenosis.  7. The inferior vena cava is normal in size with greater than 50%  respiratory variability, suggesting right atrial pressure of 3 mmHg.   Laboratory Data:   Chemistry Recent Labs  Lab 04/17/20 1357 04/17/20 1630 04/17/20 2031  04/18/20 0531 04/18/20 0855  NA 141   < > 143 144 142  K 4.5   < > 5.4* 4.7 5.1  CL 111  --   --   --  115*  CO2 20*  --   --   --  16*  GLUCOSE 202*  --   --   --  155*  BUN 18  --   --   --  29*  CREATININE 1.19  --   --   --  2.42*  CALCIUM 8.7*  --   --   --  8.1*  GFRNONAA 58*  --   --   --  24*  ANIONGAP 10  --   --   --  11   < > = values in this interval not displayed.    Lab Results  Component Value Date   ALT 14 04/18/2020   AST 28 04/18/2020   ALKPHOS 34 (L) 04/18/2020   BILITOT 0.5 04/18/2020   Hematology Recent Labs  Lab 04/17/20 1357 04/17/20 1630 04/17/20 2031 04/18/20 0531 04/18/20 0855  WBC 14.3*  --   --   --  17.1*  RBC 3.33*  --   --   --  3.08*  HGB 11.3*   < > 10.5* 10.2*  10.2*  HCT 35.2*   < > 31.0* 30.0* 32.6*  MCV 105.7*  --   --   --  105.8*  MCH 33.9  --   --   --  33.1  MCHC 32.1  --   --   --  31.3  RDW 13.1  --   --   --  13.6  PLT 159  --   --   --  147*   < > = values in this interval not displayed.   Cardiac Enzymes High Sensitivity Troponin:  No results for input(s): TROPONINIHS in the last 720 hours.    BNPNo results for input(s): BNP, PROBNP in the last 168 hours.  DDimer No results for input(s): DDIMER in the last 168 hours. TSH:  Lab Results  Component Value Date   TSH 0.98 05/08/2011   Lipids: Lab Results  Component Value Date   CHOL 144 01/25/2019   HDL 55.00 01/25/2019   LDLCALC 74 01/25/2019   TRIG 79.0 01/25/2019   CHOLHDL 3 01/25/2019   HgbA1c:No results found for: HGBA1C Magnesium:  Magnesium  Date Value Ref Range Status  04/18/2020 1.5 (L) 1.7 - 2.4 mg/dL Final    Comment:    Performed at [facility], [place] [address], [place], [place] [address]     Radiology/Studies:  [facility] Chest Port 1 View  Result Date: 04/18/2020 CLINICAL DATA:  Evaluate for Bibasilar opacities scarring or atelectasis. effusions or pneumothorax. EXAM: PORTABLE CHEST 1 VIEW COMPARISON:  04/17/2020 FINDINGS: Enteric tube is  unchanged in position. Shallow inspiration with mild atelectasis in the lung bases. Heart size and pulmonary vascularity are normal. No consolidation or airspace disease in the lungs. No pleural effusions. No pneumothorax. Tortuous aorta. IMPRESSION: Shallow inspiration with mild atelectasis in the lung bases. Electronically Signed   By: Lucienne Capers M.D.   On: 04/18/2020 06:40   DG Chest Port 1 View  Result Date: 04/17/2020 CLINICAL DATA:  Postop EXAM: PORTABLE CHEST 1 VIEW COMPARISON:  03/26/2018 FINDINGS: NG tube is seen entering the stomach. Heart is normal size. Bibasilar opacities could reflect scarring or atelectasis. No effusions or pneumothorax. No acute bony abnormality. IMPRESSION: Bibasilar atelectasis or scarring. Electronically Signed   By: Rolm Baptise M.D.   On: 04/17/2020 19:12   DG Abd Portable 1V  Result Date: 04/17/2020 CLINICAL DATA:  Postop EXAM: PORTABLE ABDOMEN - 1 VIEW COMPARISON:  None. FINDINGS: NG tube is in the stomach. Nonobstructive bowel gas pattern. No organomegaly or free air. IMPRESSION: NG tube tip in the stomach. No acute findings. Electronically Signed   By: Rolm Baptise M.D.   On: 04/17/2020 19:12    Assessment and Plan:   1. Hypotension - pt on phenylephrine, wean as tolerated - I/O +3.5 for yesterday, nothing charted for today - continue to follow I/O and volume by exam.  - recheck CXR in am - ck BNP and BMET today  2. AKI - BUN/Cr 14/0.92 on 10/13 preop - Cr 1.19 just after surgery, but was 2.42 this am - he has been hydrated, will recheck labs today - K+ peak 5.4, hydration should help this as well  3. S/p AAA repair - RN to work on pain control, which may help HR as well.    Active Problems:   AAA (abdominal aortic aneurysm) ([place])     For questions or updates, please contact [facility] Please consult [email or web address] for contact info under Cardiology/STEMI.   SignedRosaria Ferries, PA-C  04/18/2020 3:01 PM  Pt seen and  examined  I agree with findings of R Barrett above PT is an 79 yo with hx of CAD (on CT scan), Echo with LVEF 50% COPD  Now post op day 2 for m AAA repair   Asked to see re hypotension  On exam, patient appears a little SOB   BP 130-148/   (on pressors)  HR 110s -120s (ST)  I/O   +4841   (little UO)   Sat 90-91% (3L) Neck is full  JVP is 10 LUngs with rhonchi Cardiac exam:   RRR   No S3    Abd SL distended   Deferred     Ext  No LE edema    Currently nurse is tapering down neo  COntinue this     Would repeat ABG, CBC and BMET /BNP     Pt with increased volume on exam (got bolus), oxygenation is marginal   Unfortunately renal function is declining with increased Cr and marginal urine outpt.    Follow     Dorris Carnes MD

## 2020-04-19 ENCOUNTER — Inpatient Hospital Stay (HOSPITAL_COMMUNITY): Payer: Medicare HMO

## 2020-04-19 DIAGNOSIS — N179 Acute kidney failure, unspecified: Secondary | ICD-10-CM | POA: Diagnosis not present

## 2020-04-19 DIAGNOSIS — Z9889 Other specified postprocedural states: Secondary | ICD-10-CM | POA: Diagnosis not present

## 2020-04-19 DIAGNOSIS — I714 Abdominal aortic aneurysm, without rupture: Secondary | ICD-10-CM | POA: Diagnosis not present

## 2020-04-19 DIAGNOSIS — R410 Disorientation, unspecified: Secondary | ICD-10-CM | POA: Diagnosis not present

## 2020-04-19 LAB — SODIUM, URINE, RANDOM: Sodium, Ur: 22 mmol/L

## 2020-04-19 LAB — URINALYSIS, ROUTINE W REFLEX MICROSCOPIC
Bilirubin Urine: NEGATIVE
Glucose, UA: NEGATIVE mg/dL
Ketones, ur: NEGATIVE mg/dL
Nitrite: NEGATIVE
Protein, ur: 100 mg/dL — AB
Specific Gravity, Urine: 1.016 (ref 1.005–1.030)
pH: 5 (ref 5.0–8.0)

## 2020-04-19 LAB — PROTEIN / CREATININE RATIO, URINE
Creatinine, Urine: 184.61 mg/dL
Protein Creatinine Ratio: 0.61 mg/mg{Cre} — ABNORMAL HIGH (ref 0.00–0.15)
Total Protein, Urine: 112 mg/dL

## 2020-04-19 LAB — BASIC METABOLIC PANEL
Anion gap: 9 (ref 5–15)
Anion gap: 7 (ref 5–15)
BUN: 41 mg/dL — ABNORMAL HIGH (ref 8–23)
BUN: 49 mg/dL — ABNORMAL HIGH (ref 8–23)
CO2: 16 mmol/L — ABNORMAL LOW (ref 22–32)
CO2: 17 mmol/L — ABNORMAL LOW (ref 22–32)
Calcium: 8 mg/dL — ABNORMAL LOW (ref 8.9–10.3)
Calcium: 7.8 mg/dL — ABNORMAL LOW (ref 8.9–10.3)
Chloride: 118 mmol/L — ABNORMAL HIGH (ref 98–111)
Chloride: 119 mmol/L — ABNORMAL HIGH (ref 98–111)
Creatinine, Ser: 3.1 mg/dL — ABNORMAL HIGH (ref 0.61–1.24)
Creatinine, Ser: 2.95 mg/dL — ABNORMAL HIGH (ref 0.61–1.24)
GFR, Estimated: 18 mL/min — ABNORMAL LOW (ref >60)
GFR, Estimated: 21 mL/min — ABNORMAL LOW (ref >60)
Glucose, Bld: 144 mg/dL — ABNORMAL HIGH (ref 70–99)
Glucose, Bld: 138 mg/dL — ABNORMAL HIGH (ref 70–99)
Potassium: 5.1 mmol/L (ref 3.5–5.1)
Potassium: 5 mmol/L (ref 3.5–5.1)
Sodium: 143 mmol/L (ref 135–145)
Sodium: 143 mmol/L (ref 135–145)

## 2020-04-19 LAB — CBC
HCT: 28.5 % — ABNORMAL LOW (ref 39.0–52.0)
Hemoglobin: 9 g/dL — ABNORMAL LOW (ref 13.0–17.0)
MCH: 33.7 pg (ref 26.0–34.0)
MCHC: 31.6 g/dL (ref 30.0–36.0)
MCV: 106.7 fL — ABNORMAL HIGH (ref 80.0–100.0)
Platelets: 125 10*3/uL — ABNORMAL LOW (ref 150–400)
RBC: 2.67 MIL/uL — ABNORMAL LOW (ref 4.22–5.81)
RDW: 13.7 % (ref 11.5–15.5)
WBC: 16.7 10*3/uL — ABNORMAL HIGH (ref 4.0–10.5)
nRBC: 0 % (ref 0.0–0.2)

## 2020-04-19 LAB — POCT I-STAT 7, (LYTES, BLD GAS, ICA,H+H)
Acid-base deficit: 7 mmol/L — ABNORMAL HIGH (ref 0.0–2.0)
Bicarbonate: 18.1 mmol/L — ABNORMAL LOW (ref 20.0–28.0)
Calcium, Ion: 1.15 mmol/L (ref 1.15–1.40)
HCT: 26 % — ABNORMAL LOW (ref 39.0–52.0)
Hemoglobin: 8.8 g/dL — ABNORMAL LOW (ref 13.0–17.0)
O2 Saturation: 94 %
Patient temperature: 98
Potassium: 5 mmol/L (ref 3.5–5.1)
Sodium: 144 mmol/L (ref 135–145)
TCO2: 19 mmol/L — ABNORMAL LOW (ref 22–32)
pCO2 arterial: 31.2 mmHg — ABNORMAL LOW (ref 32.0–48.0)
pH, Arterial: 7.37 (ref 7.350–7.450)
pO2, Arterial: 69 mmHg — ABNORMAL LOW (ref 83.0–108.0)

## 2020-04-19 LAB — MAGNESIUM: Magnesium: 2.5 mg/dL — ABNORMAL HIGH (ref 1.7–2.4)

## 2020-04-19 MED ORDER — METOPROLOL TARTRATE 5 MG/5ML IV SOLN
2.5000 mg | INTRAVENOUS | Status: AC | PRN
  Administered 2020-04-20 (×2): 2.5 mg via INTRAVENOUS
  Filled 2020-04-19 (×2): qty 5

## 2020-04-19 MED ORDER — OXYCODONE HCL 5 MG/5ML PO SOLN
5.0000 mg | Freq: Four times a day (QID) | ORAL | Status: DC | PRN
  Administered 2020-04-19: 5 mg via ORAL
  Filled 2020-04-19: qty 5

## 2020-04-19 MED ORDER — RISPERIDONE 0.5 MG PO TBDP
0.5000 mg | ORAL_TABLET | Freq: Two times a day (BID) | ORAL | Status: DC
  Administered 2020-04-19: 0.5 mg via ORAL
  Filled 2020-04-19 (×2): qty 1

## 2020-04-19 MED ORDER — RISPERIDONE 0.5 MG PO TBDP
0.5000 mg | ORAL_TABLET | Freq: Two times a day (BID) | ORAL | Status: DC
  Administered 2020-04-19 – 2020-04-22 (×6): 0.5 mg via SUBLINGUAL
  Filled 2020-04-19 (×6): qty 1

## 2020-04-19 MED ORDER — PANTOPRAZOLE SODIUM 40 MG IV SOLR
40.0000 mg | Freq: Two times a day (BID) | INTRAVENOUS | Status: DC
  Administered 2020-04-19 – 2020-04-24 (×11): 40 mg via INTRAVENOUS
  Filled 2020-04-19 (×12): qty 40

## 2020-04-19 MED ORDER — HYDROMORPHONE HCL 1 MG/ML IJ SOLN
0.5000 mg | INTRAMUSCULAR | Status: DC | PRN
  Administered 2020-04-19 – 2020-04-20 (×9): 1 mg via INTRAVENOUS
  Filled 2020-04-19 (×8): qty 1

## 2020-04-19 MED ORDER — OXYCODONE HCL 5 MG/5ML PO SOLN
5.0000 mg | Freq: Four times a day (QID) | ORAL | Status: DC | PRN
  Administered 2020-04-19 – 2020-04-20 (×2): 5 mg
  Filled 2020-04-19 (×2): qty 5

## 2020-04-19 MED ORDER — METOPROLOL TARTRATE 5 MG/5ML IV SOLN
5.0000 mg | Freq: Four times a day (QID) | INTRAVENOUS | Status: DC
  Administered 2020-04-19 – 2020-04-20 (×5): 5 mg via INTRAVENOUS
  Filled 2020-04-19 (×5): qty 5

## 2020-04-19 NOTE — Progress Notes (Addendum)
Progress Note  Patient Name: Carlos Olson Date of Encounter: 04/19/2020  [facility] Cardiologist: Werner Lean, MD   Subjective   Pt moaning   COmplains of pain at nose  Inpatient Medications    Scheduled Meds: . aspirin  81 mg Per Tube Daily  . atorvastatin  10 mg Per Tube QHS  . Chlorhexidine Gluconate Cloth  6 each Topical Daily  . fluticasone furoate-vilanterol  1 puff Inhalation Daily  . heparin  5,000 Units Subcutaneous Q8H  . mouth rinse  15 mL Mouth Rinse BID  . pantoprazole (PROTONIX) IV  40 mg Intravenous Q12H   Continuous Infusions: . sodium chloride Stopped (04/17/20 1643)  . dextrose 5 % and 0.9% NaCl 125 mL/hr at 04/19/20 0600  . phenylephrine (NEO-SYNEPHRINE) Adult infusion Stopped (04/18/20 1707)   PRN Meds: acetaminophen **OR** acetaminophen, alum & mag hydroxide-simeth, bisacodyl, guaiFENesin-dextromethorphan, hydrALAZINE, HYDROmorphone (DILAUDID) injection, labetalol, LORazepam, metoprolol tartrate, ondansetron, phenol, potassium chloride, sodium phosphate   Vital Signs    Vitals:   04/19/20 0330 04/19/20 0400 04/19/20 0500 04/19/20 0600  BP:  132/60 (!) 141/61 126/65  Pulse: (!) 112 (!) 112 (!) 124 (!) 110  Resp: 15 14 (!) 22 18  Temp:      TempSrc:      SpO2: (!) 89% 91% 91% 92%  Weight:      Height:        Intake/Output Summary (Last 24 hours) at 04/19/2020 0902 Last data filed at 04/19/2020 0600 Gross per 24 hour  Intake 2392.02 ml  Output 245 ml  Net 2147.02 ml   I/O NET   5.7 L positive  Last 3 Weights 04/17/2020 04/11/2020 04/06/2020  Weight (lbs) 210 lb 210 lb 8 oz 212 lb  Weight (kg) 95.255 kg 95.482 kg 96.163 kg      Telemetry    SR /ST, PACs  ? Short bursts afib vs ectopy  - Personally Reviewed  ECG    No new Personally Reviewed  Physical Exam   GEN: Moaning  Eyes closed   Neck: No JVD Cardiac: RRR, no murmurs.  Respiratory: Clear anteriorly   GI: Soft    No BS MS: No edema;  Neuro:  Nonfocal    Psych: Normal affect   Labs    High Sensitivity Troponin:  No results for input(s): TROPONINIHS in the last 720 hours.    Chemistry Recent Labs  Lab 04/18/20 0855 04/18/20 1702 04/18/20 1722 04/19/20 0250 04/19/20 0829  NA 142   < > 142 143 144  K 5.1   < > 5.0 5.1 5.0  CL 115*  --  115* 118*  --   CO2 16*  --  17* 16*  --   GLUCOSE 155*  --  128* 144*  --   BUN 29*  --  33* 41*  --   CREATININE 2.42*  --  2.81* 3.10*  --   CALCIUM 8.1*  --  7.8* 8.0*  --   PROT 4.8*  --   --   --   --   ALBUMIN 2.9*  --   --   --   --   AST 28  --   --   --   --   ALT 14  --   --   --   --   ALKPHOS 34*  --   --   --   --   BILITOT 0.5  --   --   --   --  GFRNONAA 24*  --  20* 18*  --   ANIONGAP 11  --  10 9  --    < > = values in this interval not displayed.     Hematology Recent Labs  Lab 04/18/20 0855 04/18/20 1702 04/18/20 1711 04/19/20 0250 04/19/20 0829  WBC 17.1*  --  18.4* 16.7*  --   RBC 3.08*  --  2.80* 2.67*  --   HGB 10.2*   < > 9.4* 9.0* 8.8*  HCT 32.6*   < > 29.8* 28.5* 26.0*  MCV 105.8*  --  106.4* 106.7*  --   MCH 33.1  --  33.6 33.7  --   MCHC 31.3  --  31.5 31.6  --   RDW 13.6  --  13.5 13.7  --   PLT 147*  --  128* 125*  --    < > = values in this interval not displayed.    BNP Recent Labs  Lab 04/18/20 1710  BNP 140.8*     DDimer No results for input(s): DDIMER in the last 168 hours.   Radiology    DG Chest Port 1 View  Result Date: 04/18/2020 CLINICAL DATA:  Evaluate for Bibasilar opacities scarring or atelectasis. effusions or pneumothorax. EXAM: PORTABLE CHEST 1 VIEW COMPARISON:  04/17/2020 FINDINGS: Enteric tube is unchanged in position. Shallow inspiration with mild atelectasis in the lung bases. Heart size and pulmonary vascularity are normal. No consolidation or airspace disease in the lungs. No pleural effusions. No pneumothorax. Tortuous aorta. IMPRESSION: Shallow inspiration with mild atelectasis in the lung bases. Electronically  Signed   By: Lucienne Capers M.D.   On: 04/18/2020 06:40   DG Chest Port 1 View  Result Date: 04/17/2020 CLINICAL DATA:  Postop EXAM: PORTABLE CHEST 1 VIEW COMPARISON:  03/26/2018 FINDINGS: NG tube is seen entering the stomach. Heart is normal size. Bibasilar opacities could reflect scarring or atelectasis. No effusions or pneumothorax. No acute bony abnormality. IMPRESSION: Bibasilar atelectasis or scarring. Electronically Signed   By: Rolm Baptise M.D.   On: 04/17/2020 19:12   DG Abd Portable 1V  Result Date: 04/17/2020 CLINICAL DATA:  Postop EXAM: PORTABLE ABDOMEN - 1 VIEW COMPARISON:  None. FINDINGS: NG tube is in the stomach. Nonobstructive bowel gas pattern. No organomegaly or free air. IMPRESSION: NG tube tip in the stomach. No acute findings. Electronically Signed   By: Rolm Baptise M.D.   On: 04/17/2020 19:12    Cardiac Studies    Patient Profile     Carlos Olson is a 79 y.o. 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  Assessment & Plan    1  Hypotension  BP is improved   He is off of pressors  Actually high at times  I would follow for now  No added meds  Urine output poor and but would not push diuresing now  REpeat Cr later later this PM  2   Rhythm  Pt was tachycardic earlier today with a lot of atrial ectopy  (? Even short bursts of afib)  It appears around 8: 30 he got labetalol and rhythm quieted some Will place on set IV lopressor 5 mg every 6 hours with prn metoprolol for high heart rates  FOllow BP / HR response   3  GI   NG aspirate is coffee colored   Would switch protonix to IV.  I don't think PO absorption reliable now.  Will continue to follow.    For questions or updates, please contact [place] Please consult  [email or web address] for contact info under        Signed, Dorris Carnes, MD  04/19/2020, 9:02 AM

## 2020-04-19 NOTE — Consult Note (Signed)
NAME:  [name], MRN:  [id number], DOB:  [birth year], LOS: 2 ADMISSION DATE:  04/17/2020, CONSULTATION DATE: 10/21 REFERRING MD: Dr. Donzetta Matters, CHIEF COMPLAINT: AKI with mild hypoxia  Brief History   79 year old male who presented 10/18 for elective endovascular repair abdominal aortic aneurysm with Dr. Donzetta Matters. PCCM consulted 10/21 for assistance in medical management given AKI and mild hypoxia  History of present illness   Carlos Olson is a 79 year old male with a past medical history significant for AAA, history of prostate cancer, heart murmur, diverticulitis, COPD, CAD, and atherosclerosis who presented for elective endovascular repair of abdominal aortic aneurysm with Dr. Donzetta Matters. Overnight postop day 0 patient required low-dose pressor support for mild hypotension, vascular surgery consulted cardiology initially for assistance of hypotension.  Morning of 10/21 patient was seen with worsening creatinine with rise from 1.19-3.10 10/21 and decreasing urine output with approximately 245 mL of urine out last 24 hours. Additionally he was seen with worsening confusion after receiving opioid administration. Pressor support discontinued evening of 12/20. Given worsened AKI and mild hypoxia PCCM consulted for further medical management.  Past Medical History  AAA History of prostate cancer Heart murmur Diverticulitis COPD CAD atherosclerosis  Significant Hospital Events   Admitted 10/18 for elective AAA repair  Consults:  Cardiology PCCM  Procedures:  Endovascular AAA repair 10/19  Significant Diagnostic Tests:    Micro Data:    Antimicrobials:    Interim history/subjective:  Lying in bed altered  Family at bedside   Objective   Blood pressure 126/65, pulse (!) 110, temperature 100.2 F (37.9 C), temperature source Axillary, resp. rate 18, height 6\' 3"  (1.905 m), weight 95.3 kg, SpO2 92 %.        Intake/Output Summary (Last 24 hours) at 04/19/2020 0920 Last data filed at  04/19/2020 0600 Gross per 24 hour  Intake 2392.02 ml  Output 245 ml  Net 2147.02 ml   Filed Weights   04/17/20 0603  Weight: 95.3 kg    Examination: General: Chronically ill appearing elderly male lying in bed in moderate discomfort with intermittent moaning  in NAD HEENT: [place]/AT, MM pink/moist, PERRL,  Neuro: Confused with sigh of delirium  CV: s1s2 regular rate and rhythm, no murmur, rubs, or gallops,  PULM:  Clear to ascultation bilaterally, no added breath sounds no increased work of breathing GI: soft, bowel sounds active in all 4 quadrants, non-tender, non-distended Extremities: warm/dry, no edema  Skin: no rashes or lesions  Resolved Hospital Problem list     Assessment & Plan:   Acute Kidney Injury  -Lab work on admission 10/19 revealed creatinine of 1.13 and GFR 58, morning of PCCM consultation creatinine is now 3.10 with GFR 18 -Patient is currently 5 L fluid positive P: Given precipitous drop in renal function and new onset oliguria may need nephrology consult Follow renal function / urine output Trend Bmet Avoid nephrotoxins Ensure adequate renal perfusion  Urine lytes pending Renal ultrasound pending  History of COPD Former smoker History of pulmonary nodules -Primary pulmonologist is Dr. Lamonte Sakai, last seen in pulmonary clinic 11/25/2019 P: Continue home Trelegy PRN bronchodilators  Supplemental oxygen as needed   Intermittent tachycardia with possible underlying atrial fibrillation -Patient seen with frequent ectopy and tachycardia overnight of 10/21, rhythm improved after receiving labetalol P: Cardiology consult Continue scheduled IV Lopressor Continuous telemetry  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 -Postoperatively patient seen with hypotension requiring pressor support this was discontinued 10/21 P: Primary  management per vascular surgery  Combined systolic and diastolic  congestive heart failure -Patient underwent preop echocardiogram that revealed EF of 50% with global hypokinesis of left ventricle and grade 1 diastolic dysfunction History of CAD P: Cardiology following Monitor volume status closely Strict intake and output  Acute metabolic encephalopathy - P: Low dose respiridole  Stop benzos Minimize sedation as able  Delirium precautions   Best practice:  Diet: NPO Pain/Anxiety/Delirium protocol (if indicated): PRN VAP protocol (if indicated): N/A DVT prophylaxis: Heparin GI prophylaxis: PPI Glucose control: Monitor  Mobility: Bedrest Code Status: Full Family Communication: Up with assistance  Disposition: ICU  Labs   CBC: Recent Labs  Lab 04/17/20 1357 04/17/20 1630 04/18/20 0855 04/18/20 1702 04/18/20 1711 04/19/20 0250 04/19/20 0829  WBC 14.3*  --  17.1*  --  18.4* 16.7*  --   HGB 11.3*   < > 10.2* 9.2* 9.4* 9.0* 8.8*  HCT 35.2*   < > 32.6* 27.0* 29.8* 28.5* 26.0*  MCV 105.7*  --  105.8*  --  106.4* 106.7*  --   PLT 159  --  147*  --  128* 125*  --    < > = values in this interval not displayed.    Basic Metabolic Panel: Recent Labs  Lab 04/17/20 1357 04/17/20 1630 04/18/20 0855 04/18/20 1702 04/18/20 1722 04/19/20 0250 04/19/20 0829  NA 141   < > 142 144 142 143 144  K 4.5   < > 5.1 5.0 5.0 5.1 5.0  CL 111  --  115*  --  115* 118*  --   CO2 20*  --  16*  --  17* 16*  --   GLUCOSE 202*  --  155*  --  128* 144*  --   BUN 18  --  29*  --  33* 41*  --   CREATININE 1.19  --  2.42*  --  2.81* 3.10*  --   CALCIUM 8.7*  --  8.1*  --  7.8* 8.0*  --   MG 1.7  --  1.5*  --   --  2.5*  --    < > = values in this interval not displayed.   GFR: Estimated Creatinine Clearance: 22.7 mL/min (A) (by C-G formula based on SCr of 3.1 mg/dL (H)). Recent Labs  Lab 04/17/20 1357 04/18/20 0855 04/18/20 1711 04/19/20 0250  WBC 14.3* 17.1* 18.4* 16.7*    Liver Function Tests: Recent Labs  Lab 04/18/20 0855  AST 28  ALT  14  ALKPHOS 34*  BILITOT 0.5  PROT 4.8*  ALBUMIN 2.9*   Recent Labs  Lab 04/18/20 0855  AMYLASE 26*   No results for input(s): AMMONIA in the last 168 hours.  ABG    Component Value Date/Time   PHART 7.370 04/19/2020 0829   PCO2ART 31.2 (L) 04/19/2020 0829   PO2ART 69 (L) 04/19/2020 0829   HCO3 18.1 (L) 04/19/2020 0829   TCO2 19 (L) 04/19/2020 0829   ACIDBASEDEF 7.0 (H) 04/19/2020 0829   O2SAT 94.0 04/19/2020 0829     Coagulation Profile: Recent Labs  Lab 04/17/20 1357  INR 1.2    Cardiac Enzymes: No results for input(s): CKTOTAL, CKMB, CKMBINDEX, TROPONINI in the last 168 hours.  HbA1C: No results found for: HGBA1C  CBG: No results for input(s): GLUCAP in the last 168 hours.     Critical care time:   CRITICAL CARE Performed by: Johnsie Cancel  Total critical care time: 40 minutes  Critical care time  was exclusive of separately billable procedures and treating other patients.  Critical care was necessary to treat or prevent imminent or life-threatening deterioration.  Critical care was time spent personally by me on the following activities: development of treatment plan with patient and/or surrogate as well as nursing, discussions with consultants, evaluation of patient's response to treatment, examination of patient, obtaining history from patient or surrogate, ordering and performing treatments and interventions, ordering and review of laboratory studies, ordering and review of radiographic studies, pulse oximetry and re-evaluation of patient's condition.  Johnsie Cancel, NP-C [facility] Contact / Pager information can be found on Amion  04/19/2020, 10:33 AM

## 2020-04-19 NOTE — Progress Notes (Addendum)
[facility] [place]  Subjective  - Moaning, but can't find a definitive source of pain.     Objective 126/65 (!) 110 100.2 F (37.9 C) (Axillary) 18 92%  Intake/Output Summary (Last 24 hours) at 04/19/2020 0736 Last data filed at 04/19/2020 0600 Gross per 24 hour  Intake 2392.02 ml  Output 245 ml  Net 2147.02 ml    Heart rate tachy with possible Afib Moving all extremities Palpable pedal pulses Abdomin slightly distended, low minimal bowel sounds.  Incision healing well. Right groin soft without hematoma, incision intact   Assessment/Planning: POD # 2  Open abdominal and common iliac artery aneurysm and right common femoral artery aneurysm repair with infrarenal aorta to left hypogastric and transposition of the left external iliac artery bypass and right common femoral artery bypass with 16 x 8 mm Dacron  He is hypertensive, but stable off vasopressor for previous hypotension. Heart tacy/Afib.  EKG pending and labs. Urine Op 245 last 24 hours, Cr elevating now 3.1 clapped aorta below renals.  Received bolus and IV fluids are at 125 ml/hr. Acid/base mild acidoses Ph 7.3 Moans, won't open eyes receiving dilaudid 1 mg push q 2 hours and received ativan last night.   Will consult CCM for assistance.   Roxy Horseman 04/19/2020 7:36 AM --  Laboratory Lab Results: Recent Labs    04/18/20 1711 04/19/20 0250  WBC 18.4* 16.7*  HGB 9.4* 9.0*  HCT 29.8* 28.5*  PLT 128* 125*   BMET Recent Labs    04/18/20 1722 04/19/20 0250  NA 142 143  K 5.0 5.1  CL 115* 118*  CO2 17* 16*  GLUCOSE 128* 144*  BUN 33* 41*  CREATININE 2.81* 3.10*  CALCIUM 7.8* 8.0*    COAG Lab Results  Component Value Date   INR 1.2 04/17/2020   INR 1.0 04/11/2020   INR 0.97 05/07/2017   No results found for: PTT  I have independently interviewed and examined patient and agree with PA assessment and plan above.  Cardiology and critical care assistance  much appreciated with this patient.  Heart rate blood pressure much improved today.  Will need to monitor renal function we will recheck BMP this afternoon.  Out of bed if he can tolerate today possibly can remove NG tube as it has minimal drainage.  Angelin Cutrone C. Donzetta Matters, MD [facility] [place] Office: [phone] Pager: [phone]

## 2020-04-20 ENCOUNTER — Other Ambulatory Visit: Payer: Self-pay

## 2020-04-20 DIAGNOSIS — R1084 Generalized abdominal pain: Secondary | ICD-10-CM | POA: Diagnosis not present

## 2020-04-20 DIAGNOSIS — Z9889 Other specified postprocedural states: Secondary | ICD-10-CM

## 2020-04-20 DIAGNOSIS — R410 Disorientation, unspecified: Secondary | ICD-10-CM

## 2020-04-20 DIAGNOSIS — I714 Abdominal aortic aneurysm, without rupture: Secondary | ICD-10-CM | POA: Diagnosis not present

## 2020-04-20 DIAGNOSIS — N179 Acute kidney failure, unspecified: Secondary | ICD-10-CM | POA: Diagnosis not present

## 2020-04-20 LAB — COMPREHENSIVE METABOLIC PANEL
ALT: 21 U/L (ref 0–44)
AST: 45 U/L — ABNORMAL HIGH (ref 15–41)
Albumin: 2 g/dL — ABNORMAL LOW (ref 3.5–5.0)
Alkaline Phosphatase: 48 U/L (ref 38–126)
Anion gap: 11 (ref 5–15)
BUN: 49 mg/dL — ABNORMAL HIGH (ref 8–23)
CO2: 17 mmol/L — ABNORMAL LOW (ref 22–32)
Calcium: 8.2 mg/dL — ABNORMAL LOW (ref 8.9–10.3)
Chloride: 118 mmol/L — ABNORMAL HIGH (ref 98–111)
Creatinine, Ser: 2.42 mg/dL — ABNORMAL HIGH (ref 0.61–1.24)
GFR, Estimated: 26 mL/min — ABNORMAL LOW (ref >60)
Glucose, Bld: 123 mg/dL — ABNORMAL HIGH (ref 70–99)
Potassium: 3.9 mmol/L (ref 3.5–5.1)
Sodium: 146 mmol/L — ABNORMAL HIGH (ref 135–145)
Total Bilirubin: 0.8 mg/dL (ref 0.3–1.2)
Total Protein: 4.6 g/dL — ABNORMAL LOW (ref 6.5–8.1)

## 2020-04-20 LAB — URINALYSIS, ROUTINE W REFLEX MICROSCOPIC
Bilirubin Urine: NEGATIVE
Glucose, UA: NEGATIVE mg/dL
Ketones, ur: NEGATIVE mg/dL
Leukocytes,Ua: NEGATIVE
Nitrite: NEGATIVE
Protein, ur: NEGATIVE mg/dL
Specific Gravity, Urine: 1.008 (ref 1.005–1.030)
Waxy Casts, UA: 1
pH: 5 (ref 5.0–8.0)

## 2020-04-20 LAB — BASIC METABOLIC PANEL
Anion gap: 9 (ref 5–15)
Anion gap: 10 (ref 5–15)
BUN: 50 mg/dL — ABNORMAL HIGH (ref 8–23)
BUN: 47 mg/dL — ABNORMAL HIGH (ref 8–23)
CO2: 16 mmol/L — ABNORMAL LOW (ref 22–32)
CO2: 17 mmol/L — ABNORMAL LOW (ref 22–32)
Calcium: 7.9 mg/dL — ABNORMAL LOW (ref 8.9–10.3)
Calcium: 8.4 mg/dL — ABNORMAL LOW (ref 8.9–10.3)
Chloride: 120 mmol/L — ABNORMAL HIGH (ref 98–111)
Chloride: 120 mmol/L — ABNORMAL HIGH (ref 98–111)
Creatinine, Ser: 2.71 mg/dL — ABNORMAL HIGH (ref 0.61–1.24)
Creatinine, Ser: 2.45 mg/dL — ABNORMAL HIGH (ref 0.61–1.24)
GFR, Estimated: 23 mL/min — ABNORMAL LOW (ref >60)
GFR, Estimated: 26 mL/min — ABNORMAL LOW (ref >60)
Glucose, Bld: 137 mg/dL — ABNORMAL HIGH (ref 70–99)
Glucose, Bld: 120 mg/dL — ABNORMAL HIGH (ref 70–99)
Potassium: 4.7 mmol/L (ref 3.5–5.1)
Potassium: 4.2 mmol/L (ref 3.5–5.1)
Sodium: 145 mmol/L (ref 135–145)
Sodium: 147 mmol/L — ABNORMAL HIGH (ref 135–145)

## 2020-04-20 LAB — CBC
HCT: 23.8 % — ABNORMAL LOW (ref 39.0–52.0)
HCT: 29.8 % — ABNORMAL LOW (ref 39.0–52.0)
HCT: 27.8 % — ABNORMAL LOW (ref 39.0–52.0)
Hemoglobin: 7.4 g/dL — ABNORMAL LOW (ref 13.0–17.0)
Hemoglobin: 9.8 g/dL — ABNORMAL LOW (ref 13.0–17.0)
Hemoglobin: 9.1 g/dL — ABNORMAL LOW (ref 13.0–17.0)
MCH: 33.3 pg (ref 26.0–34.0)
MCH: 32.6 pg (ref 26.0–34.0)
MCH: 32.3 pg (ref 26.0–34.0)
MCHC: 31.1 g/dL (ref 30.0–36.0)
MCHC: 32.9 g/dL (ref 30.0–36.0)
MCHC: 32.7 g/dL (ref 30.0–36.0)
MCV: 107.2 fL — ABNORMAL HIGH (ref 80.0–100.0)
MCV: 99 fL (ref 80.0–100.0)
MCV: 98.6 fL (ref 80.0–100.0)
Platelets: 113 10*3/uL — ABNORMAL LOW (ref 150–400)
Platelets: 122 10*3/uL — ABNORMAL LOW (ref 150–400)
Platelets: 113 10*3/uL — ABNORMAL LOW (ref 150–400)
RBC: 2.22 MIL/uL — ABNORMAL LOW (ref 4.22–5.81)
RBC: 3.01 MIL/uL — ABNORMAL LOW (ref 4.22–5.81)
RBC: 2.82 MIL/uL — ABNORMAL LOW (ref 4.22–5.81)
RDW: 14.1 % (ref 11.5–15.5)
RDW: 16.4 % — ABNORMAL HIGH (ref 11.5–15.5)
RDW: 16.7 % — ABNORMAL HIGH (ref 11.5–15.5)
WBC: 13 10*3/uL — ABNORMAL HIGH (ref 4.0–10.5)
WBC: 15.5 10*3/uL — ABNORMAL HIGH (ref 4.0–10.5)
WBC: 14.1 10*3/uL — ABNORMAL HIGH (ref 4.0–10.5)
nRBC: 0 % (ref 0.0–0.2)
nRBC: 0 % (ref 0.0–0.2)
nRBC: 0 % (ref 0.0–0.2)

## 2020-04-20 LAB — PREPARE RBC (CROSSMATCH)

## 2020-04-20 LAB — LACTIC ACID, PLASMA
Lactic Acid, Venous: 1.1 mmol/L (ref 0.5–1.9)
Lactic Acid, Venous: 1 mmol/L (ref 0.5–1.9)

## 2020-04-20 LAB — MAGNESIUM: Magnesium: 2.5 mg/dL — ABNORMAL HIGH (ref 1.7–2.4)

## 2020-04-20 MED ORDER — DILTIAZEM HCL-DEXTROSE 125-5 MG/125ML-% IV SOLN (PREMIX)
5.0000 mg/h | INTRAVENOUS | Status: DC
  Filled 2020-04-20: qty 125

## 2020-04-20 MED ORDER — PIPERACILLIN-TAZOBACTAM 3.375 G IVPB
3.3750 g | Freq: Three times a day (TID) | INTRAVENOUS | Status: DC
  Administered 2020-04-20 – 2020-04-25 (×15): 3.375 g via INTRAVENOUS
  Filled 2020-04-20 (×15): qty 50

## 2020-04-20 MED ORDER — DEXMEDETOMIDINE HCL IN NACL 400 MCG/100ML IV SOLN
0.4000 ug/kg/h | INTRAVENOUS | Status: DC
  Administered 2020-04-20: 0.8 ug/kg/h via INTRAVENOUS
  Administered 2020-04-20: 0.6 ug/kg/h via INTRAVENOUS
  Administered 2020-04-20: 0.4 ug/kg/h via INTRAVENOUS
  Administered 2020-04-21: 1.2 ug/kg/h via INTRAVENOUS
  Administered 2020-04-21: 1 ug/kg/h via INTRAVENOUS
  Administered 2020-04-21 – 2020-04-22 (×5): 1.2 ug/kg/h via INTRAVENOUS
  Administered 2020-04-22: 1.1 ug/kg/h via INTRAVENOUS
  Administered 2020-04-22 (×2): 0.9 ug/kg/h via INTRAVENOUS
  Administered 2020-04-23: 0.5 ug/kg/h via INTRAVENOUS
  Administered 2020-04-23: 0.4 ug/kg/h via INTRAVENOUS
  Administered 2020-04-23: 1.2 ug/kg/h via INTRAVENOUS
  Administered 2020-04-23: 0.9 ug/kg/h via INTRAVENOUS
  Administered 2020-04-24: 0.7 ug/kg/h via INTRAVENOUS
  Administered 2020-04-25: 1 ug/kg/h via INTRAVENOUS
  Administered 2020-04-25: 0.6 ug/kg/h via INTRAVENOUS
  Administered 2020-04-25: 1 ug/kg/h via INTRAVENOUS
  Administered 2020-04-26: 0.8 ug/kg/h via INTRAVENOUS
  Filled 2020-04-20 (×11): qty 100
  Filled 2020-04-20: qty 300
  Filled 2020-04-20 (×8): qty 100
  Filled 2020-04-20: qty 200
  Filled 2020-04-20: qty 100

## 2020-04-20 MED ORDER — AMIODARONE LOAD VIA INFUSION
150.0000 mg | Freq: Once | INTRAVENOUS | Status: AC
  Filled 2020-04-20: qty 83.34

## 2020-04-20 MED ORDER — HEPARIN SODIUM (PORCINE) 5000 UNIT/ML IJ SOLN
5000.0000 [IU] | Freq: Three times a day (TID) | INTRAMUSCULAR | Status: DC
  Administered 2020-04-20 – 2020-04-22 (×6): 5000 [IU] via SUBCUTANEOUS
  Filled 2020-04-20 (×6): qty 1

## 2020-04-20 MED ORDER — HYDROMORPHONE HCL 1 MG/ML IJ SOLN
0.5000 mg | INTRAMUSCULAR | Status: DC | PRN
  Administered 2020-04-20: 0.5 mg via INTRAVENOUS
  Administered 2020-04-21 – 2020-05-01 (×4): 1 mg via INTRAVENOUS
  Filled 2020-04-20 (×5): qty 1

## 2020-04-20 MED ORDER — AMIODARONE HCL IN DEXTROSE 360-4.14 MG/200ML-% IV SOLN
30.0000 mg/h | INTRAVENOUS | Status: AC
  Administered 2020-04-21 – 2020-04-27 (×14): 30 mg/h via INTRAVENOUS
  Filled 2020-04-20 (×15): qty 200

## 2020-04-20 MED ORDER — DILTIAZEM LOAD VIA INFUSION
10.0000 mg | Freq: Once | INTRAVENOUS | Status: DC
  Filled 2020-04-20: qty 10

## 2020-04-20 MED ORDER — AMIODARONE HCL IN DEXTROSE 360-4.14 MG/200ML-% IV SOLN
INTRAVENOUS | Status: AC
  Administered 2020-04-20: 150 mg via INTRAVENOUS
  Filled 2020-04-20: qty 200

## 2020-04-20 MED ORDER — ACETAMINOPHEN 10 MG/ML IV SOLN
1000.0000 mg | Freq: Four times a day (QID) | INTRAVENOUS | Status: AC
  Administered 2020-04-20 – 2020-04-21 (×4): 1000 mg via INTRAVENOUS
  Filled 2020-04-20 (×4): qty 100

## 2020-04-20 MED ORDER — AMIODARONE IV BOLUS ONLY 150 MG/100ML
150.0000 mg | Freq: Once | INTRAVENOUS | Status: AC
  Administered 2020-04-20: 150 mg via INTRAVENOUS
  Filled 2020-04-20: qty 100

## 2020-04-20 MED ORDER — METOPROLOL TARTRATE 5 MG/5ML IV SOLN
5.0000 mg | Freq: Every day | INTRAVENOUS | Status: DC
  Administered 2020-04-22 – 2020-04-23 (×2): 5 mg via INTRAVENOUS
  Filled 2020-04-20 (×3): qty 5

## 2020-04-20 MED ORDER — AMIODARONE HCL IN DEXTROSE 360-4.14 MG/200ML-% IV SOLN
60.0000 mg/h | INTRAVENOUS | Status: AC
  Administered 2020-04-20 (×2): 60 mg/h via INTRAVENOUS
  Filled 2020-04-20: qty 200

## 2020-04-20 MED ORDER — LIDOCAINE 5 % EX PTCH
2.0000 | MEDICATED_PATCH | CUTANEOUS | Status: DC
  Administered 2020-04-20: 2 via TRANSDERMAL
  Filled 2020-04-20 (×2): qty 2

## 2020-04-20 MED ORDER — FUROSEMIDE 10 MG/ML IJ SOLN
80.0000 mg | Freq: Once | INTRAMUSCULAR | Status: AC
  Administered 2020-04-20: 80 mg via INTRAVENOUS
  Filled 2020-04-20: qty 8

## 2020-04-20 NOTE — Progress Notes (Signed)
Progress Note  Patient Name: Carlos Olson Date of Encounter: 04/20/2020  [facility] Cardiologist: Werner Lean, MD   Subjective   Pt moaning   Appears uncomfortable with pain  Eyes closed   Inpatient Medications    Scheduled Meds: . atorvastatin  10 mg Per Tube QHS  . Chlorhexidine Gluconate Cloth  6 each Topical Daily  . fluticasone furoate-vilanterol  1 puff Inhalation Daily  . furosemide  80 mg Intravenous Once  . mouth rinse  15 mL Mouth Rinse BID  . metoprolol tartrate  5 mg Intravenous 6 X Daily  . pantoprazole (PROTONIX) IV  40 mg Intravenous Q12H  . risperiDONE  0.5 mg Sublingual BID   Continuous Infusions: . sodium chloride Stopped (04/17/20 1643)  . amiodarone 60 mg/hr (04/20/20 0800)  . amiodarone    . amiodarone    . dextrose 5 % and 0.9% NaCl 125 mL/hr at 04/20/20 0800   PRN Meds: acetaminophen **OR** acetaminophen, alum & mag hydroxide-simeth, bisacodyl, guaiFENesin-dextromethorphan, hydrALAZINE, HYDROmorphone (DILAUDID) injection, metoprolol tartrate, ondansetron, oxyCODONE, phenol, sodium phosphate   Vital Signs    Vitals:   04/20/20 0645 04/20/20 0700 04/20/20 0747 04/20/20 0800  BP: (!) 125/102 120/73  (!) 154/74  Pulse: (!) 139 (!) 136  (!) 113  Resp: 11 12  (!) 24  Temp:   99.7 F (37.6 C)   TempSrc:   Oral   SpO2: 97% 97% (!) 86% 97%  Weight:      Height:        Intake/Output Summary (Last 24 hours) at 04/20/2020 0839 Last data filed at 04/20/2020 0800 Gross per 24 hour  Intake 3081.45 ml  Output 990 ml  Net 2091.45 ml   I/O NET   5.7 L positive  Last 3 Weights 04/17/2020 04/11/2020 04/06/2020  Weight (lbs) 210 lb 210 lb 8 oz 212 lb  Weight (kg) 95.255 kg 95.482 kg 96.163 kg      Telemetry    Afib with RVR   Now rates improved - Personally Reviewed  ECG    No new Personally Reviewed  Physical Exam   GEN: Pt moaning   Appears uncomfortable  Neck: No obvious jvd sitting  Cardiac: RRR, no murmurs.   Respiratory: Rhonchi now   GI:  Abd is distended  MS: No edema;  Feet are cool    Neuro:  Nonfocal  Psych: Normal affect   Labs    High Sensitivity Troponin:  No results for input(s): TROPONINIHS in the last 720 hours.    Chemistry Recent Labs  Lab 04/18/20 0855 04/18/20 1702 04/19/20 0250 04/19/20 0250 04/19/20 0829 04/19/20 1654 04/20/20 0229  NA 142   < > 143   < > 144 143 145  K 5.1   < > 5.1   < > 5.0 5.0 4.7  CL 115*   < > 118*  --   --  119* 120*  CO2 16*   < > 16*  --   --  17* 16*  GLUCOSE 155*   < > 144*  --   --  138* 137*  BUN 29*   < > 41*  --   --  49* 50*  CREATININE 2.42*   < > 3.10*  --   --  2.95* 2.71*  CALCIUM 8.1*   < > 8.0*  --   --  7.8* 7.9*  PROT 4.8*  --   --   --   --   --   --  ALBUMIN 2.9*  --   --   --   --   --   --   AST 28  --   --   --   --   --   --   ALT 14  --   --   --   --   --   --   ALKPHOS 34*  --   --   --   --   --   --   BILITOT 0.5  --   --   --   --   --   --   GFRNONAA 24*   < > 18*  --   --  21* 23*  ANIONGAP 11   < > 9  --   --  7 9   < > = values in this interval not displayed.     Hematology Recent Labs  Lab 04/18/20 1711 04/18/20 1711 04/19/20 0250 04/19/20 0829 04/20/20 0229  WBC 18.4*  --  16.7*  --  13.0*  RBC 2.80*  --  2.67*  --  2.22*  HGB 9.4*   < > 9.0* 8.8* 7.4*  HCT 29.8*   < > 28.5* 26.0* 23.8*  MCV 106.4*  --  106.7*  --  107.2*  MCH 33.6  --  33.7  --  33.3  MCHC 31.5  --  31.6  --  31.1  RDW 13.5  --  13.7  --  14.1  PLT 128*  --  125*  --  113*   < > = values in this interval not displayed.    BNP Recent Labs  Lab 04/18/20 1710  BNP 140.8*     DDimer No results for input(s): DDIMER in the last 168 hours.   Radiology    US RENAL  Result Date: 04/19/2020 CLINICAL DATA:  Acute renal failure EXAM: RENAL / URINARY TRACT ULTRASOUND COMPLETE COMPARISON:  Abdomen and pelvis CT 03/16/2019 FINDINGS: Right Kidney: Renal measurements: 11 x 6 x 7 cm = volume: 230 mL. Simple upper pole  cyst measuring 2.4 cm. No hydronephrosis. Left Kidney: Renal measurements: 10 x 6 x 5 cm = volume: 150 mL. Echogenicity within normal limits. No mass or hydronephrosis visualized. Bladder: Collapsed around a Foley catheter. Other: Small volume perihepatic ascites and right pleural effusion. IMPRESSION: 1. No hydronephrosis.  The bladder is collapsed by a Foley catheter. 2. Trace right pleural effusion and ascites. Electronically Signed   By: Monte Fantasia M.D.   On: 04/19/2020 12:00    Cardiac Studies    Patient Profile     Carlos Olson is a 79 y.o. 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  Assessment & Plan    1  Afib    Pt went into afib with RVR early am   Amio IV started   He remanis in afib now but HR is better   Would rebolus and keep rate at 60 mg /hour Off of anticoag with drop  2  Anemia  Pt will be getting transfused  Also on IV protonix  3  Hypertension Spikes of BP probably reflect pain/adrenaline (over 200/ per nursing)   On IV metoprolol  Would increase to q 4 hours   Has  hydralazine prn   Could make as sched but keep prn for now.    4  Renal   Pt's urine output low  Would challenge with lasix  Follow Cr       For questions or updates, please contact [place] Please consult [email or web address] for contact info under        Signed, Dorris Carnes, MD  04/20/2020, 8:39 AM

## 2020-04-20 NOTE — Progress Notes (Signed)
Patient seen and examined with daughter at bedside. He received 2 units of blood earlier today. He remains agitated and uncomfortable. His Precedex has been increased.  On examination his extremities are warm and well-perfused. His abdomen is tender but very soft without mottling. He remains hemodynamically stable. He has good urine output.  There still remains some concern for colonic ischemia. He has been seen and evaluated by GI. We have empirically started him on Zosyn. He will be monitored closely. His lactate remains normal.  Carlos Olson

## 2020-04-20 NOTE — Progress Notes (Signed)
Pharmacy Antibiotic Note  Carlos Olson is a 79 y.o. male admitted on 04/17/2020 with intraabdominal infection.  Pharmacy has been consulted for Zosyn dosing. CrCl 26 ml/min.  Plan: Start Zosyn 3.375 gm IV q8hr (EI) Monitor renal function, clinical statua and C&S  Height: 6\' 3"  (190.5 cm) Weight: 95.3 kg (210 lb) IBW/kg (Calculated) : 84.5  Temp (24hrs), Avg:99.1 F (37.3 C), Min:98 F (36.7 C), Max:100.6 F (38.1 C)  Recent Labs  Lab 04/17/20 1357 04/17/20 1357 04/18/20 0855 04/18/20 1711 04/18/20 1722 04/19/20 0250 04/19/20 1654 04/20/20 0229 04/20/20 1159  WBC 14.3*  --  17.1* 18.4*  --  16.7*  --  13.0*  --   CREATININE 1.19   < > 2.42*  --  2.81* 3.10* 2.95* 2.71*  --   LATICACIDVEN  --   --   --   --   --   --   --   --  1.1   < > = values in this interval not displayed.    Estimated Creatinine Clearance: 26 mL/min (A) (by C-G formula based on SCr of 2.71 mg/dL (H)).    Allergies  Allergen Reactions  . Levaquin [Levofloxacin]     Aortic Aneurysm  . Sulfonamide Derivatives Itching  . Sulfasalazine Itching    Antimicrobials this admission: Zosyn 10/22 >>   Thank you for allowing pharmacy to be a part of this patient's care.  Alanda Slim, PharmD, [facility] Clinical Pharmacist Please see AMION for all Pharmacists' Contact Phone Numbers 04/20/2020, 4:04 PM

## 2020-04-20 NOTE — Progress Notes (Addendum)
NAME:  Carlos Olson, MRN:  [id number], DOB:  May 17, 1941, LOS: 3 ADMISSION DATE:  04/17/2020, CONSULTATION DATE: 10/21 REFERRING MD: Dr. Donzetta Matters, CHIEF COMPLAINT: AKI with mild hypoxia  Brief History   79 year old male who presented 10/18 for elective endovascular repair abdominal aortic aneurysm with Dr. Donzetta Matters. PCCM consulted 10/21 for assistance in medical management given AKI and mild hypoxia  History of present illness   Carlos Olson is a 79 year old male with a past medical history significant for AAA, history of prostate cancer, heart murmur, diverticulitis, COPD, CAD, and atherosclerosis who presented for elective endovascular repair of abdominal aortic aneurysm with Dr. Donzetta Matters. Overnight postop day 0 patient required low-dose pressor support for mild hypotension, vascular surgery consulted cardiology initially for assistance of hypotension.  Morning of 10/21 patient was seen with worsening creatinine with rise from 1.19-3.10 10/21 and decreasing urine output with approximately 245 mL of urine out last 24 hours. Additionally he was seen with worsening confusion after receiving opioid administration. Pressor support discontinued evening of 12/20. Given worsened AKI and mild hypoxia PCCM consulted for further medical management.  Past Medical History  AAA History of prostate cancer Heart murmur Diverticulitis COPD CAD atherosclerosis  Significant Hospital Events   Admitted 10/18 for elective AAA repair  Consults:  Cardiology PCCM  Procedures:  Endovascular AAA repair 10/19  Significant Diagnostic Tests:    Micro Data:    Antimicrobials:    Interim history/subjective:  Appears to be in sever discomfort from abdominal, pelvic, and foot pain  Wife at bedside and updated 35ml urine output overnight   Objective   Blood pressure (!) 125/102, pulse (!) 139, temperature 99.7 F (37.6 C), temperature source Oral, resp. rate 11, height 6\' 3"  (1.905 m), weight 95.3 kg, SpO2 97  %.        Intake/Output Summary (Last 24 hours) at 04/20/2020 0756 Last data filed at 04/20/2020 0600 Gross per 24 hour  Intake 3011.63 ml  Output 1035 ml  Net 1976.63 ml   Filed Weights   04/17/20 0603  Weight: 95.3 kg    Examination: General: Acutely ill appearing elderly male lying in bed in severe discomfort from pain HEENT: ETT, MM pink/moist, PERRL,  Neuro: Alert and oriented x1, able to follow simple commands  CV: s1s2 regular rate and rhythm, no murmur, rubs, or gallops,  PULM:  Bilateral rhonchi, tachypnea, shallow respirations, oxygen saturations mid 90's on 10L [place]  GI: soft, bowel sounds active in all 4 quadrants, non-tender, non-distended Extremities: warm/dry, no edema Skin: no rashes or lesions  Resolved Hospital Problem list     Assessment & Plan:   Acute Kidney Injury with oliguria -Lab work on admission 10/19 revealed creatinine of 1.13 and GFR 58, morning of PCCM consultation creatinine is now 3.10 with GFR 18 -Patient is currently 5 L fluid positive Metabolic acidosis P: Nephrology consulted  Continue to monitor renal function closely Remains 7 L positive Trend bmet Renal ultrasound within normal limits Creatinine slightly down trended morning of 10/22 Follow renal function and urine output closely Trend Bumex Avoid nephrotoxins  Acute metabolic encephalopathy -Likely multifactorial including ICU delirium and benzodiazepine utilization in elderly patient P: Stop benzodiazepines Minimize opioids as able Mobilize as able Delirium precautions Continue low-dose Risperdal Precedex for agitation today  Will add IV tylenol today   History of COPD not currently in acute exasperation Former smoker History of pulmonary nodules -Primary pulmonologist is Dr. Lamonte Sakai, last seen in pulmonary clinic 11/25/2019 P: As needed bronchodilators Continue supplemental oxygen as  needed for SPO2 goal greater than 88% Continue home Trelegy  Intermittent  tachycardia with possible underlying atrial fibrillation -Patient seen with frequent ectopy and tachycardia overnight of 10/21, rhythm improved after receiving labetalol -Developed A. fib RVR overnight 10/21 P: Cardiology consulted, appreciate assistance IV amiodarone started morning of 10/22 Continuous telemetry Scheduled IV beta-blockers as able Defer decision of initiation of anticoagulation to cardiology and vascular surgery  Combined systolic and diastolic congestive heart failure -Patient underwent preop echocardiogram that revealed EF of 50% with global hypokinesis of left ventricle and grade 1 diastolic dysfunction History of CAD P: Cardiology following Monitor volume status closely Strict intake and output Consider gentle diuresis today given currently 7 L positive  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 -Postoperatively patient seen with hypotension requiring pressor support this was discontinued 10/21 P: Primary management per vascular surgery  Acute on chronic anemia P: Trend CBC Monitor for signs of bleeding Transfuse to hemoglobin greater than 7 or higher per primary team  Best practice:  Diet: NPO Pain/Anxiety/Delirium protocol (if indicated): PRN VAP protocol (if indicated): N/A DVT prophylaxis: Heparin GI prophylaxis: PPI Glucose control: Monitor  Mobility: Bedrest Code Status: Full Family Communication: Up with assistance  Disposition: ICU  Labs   CBC: Recent Labs  Lab 04/17/20 1357 04/17/20 1630 04/18/20 0855 04/18/20 0855 04/18/20 1702 04/18/20 1711 04/19/20 0250 04/19/20 0829 04/20/20 0229  WBC 14.3*  --  17.1*  --   --  18.4* 16.7*  --  13.0*  HGB 11.3*   < > 10.2*   < > 9.2* 9.4* 9.0* 8.8* 7.4*  HCT 35.2*   < > 32.6*   < > 27.0* 29.8* 28.5* 26.0* 23.8*  MCV 105.7*  --  105.8*  --   --  106.4* 106.7*  --  107.2*  PLT 159  --  147*  --   --  128* 125*  --  113*   < > =  values in this interval not displayed.    Basic Metabolic Panel: Recent Labs  Lab 04/17/20 1357 04/17/20 1630 04/18/20 0855 04/18/20 1702 04/18/20 1722 04/19/20 0250 04/19/20 0829 04/19/20 1654 04/20/20 0229  NA 141   < > 142   < > 142 143 144 143 145  K 4.5   < > 5.1   < > 5.0 5.1 5.0 5.0 4.7  CL 111   < > 115*  --  115* 118*  --  119* 120*  CO2 20*   < > 16*  --  17* 16*  --  17* 16*  GLUCOSE 202*   < > 155*  --  128* 144*  --  138* 137*  BUN 18   < > 29*  --  33* 41*  --  49* 50*  CREATININE 1.19   < > 2.42*  --  2.81* 3.10*  --  2.95* 2.71*  CALCIUM 8.7*   < > 8.1*  --  7.8* 8.0*  --  7.8* 7.9*  MG 1.7  --  1.5*  --   --  2.5*  --   --   --    < > = values in this interval not displayed.   GFR: Estimated Creatinine Clearance: 26 mL/min (A) (by C-G formula based on SCr of 2.71 mg/dL (H)). Recent Labs  Lab 04/18/20 0855 04/18/20 1711 04/19/20 0250 04/20/20 0229  WBC 17.1* 18.4* 16.7* 13.0*    Liver Function Tests: Recent Labs  Lab 04/18/20 [phone]  AST 28  ALT 14  ALKPHOS 34*  BILITOT 0.5  PROT 4.8*  ALBUMIN 2.9*   Recent Labs  Lab 04/18/20 0855  AMYLASE 26*   No results for input(s): AMMONIA in the last 168 hours.  ABG    Component Value Date/Time   PHART 7.370 04/19/2020 0829   PCO2ART 31.2 (L) 04/19/2020 0829   PO2ART 69 (L) 04/19/2020 0829   HCO3 18.1 (L) 04/19/2020 0829   TCO2 19 (L) 04/19/2020 0829   ACIDBASEDEF 7.0 (H) 04/19/2020 0829   O2SAT 94.0 04/19/2020 0829     Coagulation Profile: Recent Labs  Lab 04/17/20 1357  INR 1.2    Cardiac Enzymes: No results for input(s): CKTOTAL, CKMB, CKMBINDEX, TROPONINI in the last 168 hours.  HbA1C: No results found for: HGBA1C  CBG: No results for input(s): GLUCAP in the last 168 hours.     Critical care time:   CRITICAL CARE Performed by: Johnsie Cancel  Total critical care time:45 minutes  Critical care time was exclusive of separately billable procedures and treating other  patients.  Critical care was necessary to treat or prevent imminent or life-threatening deterioration.  Critical care was time spent personally by me on the following activities: development of treatment plan with patient and/or surrogate as well as nursing, discussions with consultants, evaluation of patient's response to treatment, examination of patient, obtaining history from patient or surrogate, ordering and performing treatments and interventions, ordering and review of laboratory studies, ordering and review of radiographic studies, pulse oximetry and re-evaluation of patient's condition.  Johnsie Cancel, NP-C [facility] Contact / Pager information can be found on Amion  04/20/2020, 7:56 AM    Pulmonary critical care attending:  79 year old male.  Past medical history of AAA, prostate cancer, diverticulitis, COPD, CAD.  Patient was taken for elective open abdominal and common iliac artery aneurysm repair.  Right common femoral artery aneurysm repair.  Patient developed renal failure and pulmonary critical care was consulted for additional evaluation.  BP (!) 114/53   Pulse 80   Temp 99 F (37.2 C) (Axillary)   Resp 19   Ht 6\' 3"  (1.905 m)   Wt 95.3 kg   SpO2 96%   BMI 26.25 kg/m   General: Elderly male, resting in bed. HEENT: Tracking appropriately, NCAT Lungs: Clear to auscultation bilaterally Heart: Regular rhythm S1-S2, tachycardic Abdomen: Mildly distended, tender to palpation. Extremities: Palpable pulse with an auscultated with Doppler.  Labs: Reviewed Chest x-ray: Not very well inflated.  No obvious pulmonary edema.  Lobar atelectasis in the base. The patient's images have been independently reviewed by me.    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 Pain control, anxiety control Abdominal pain Atrial fibrillation with RVR   Plan: Continue BP control, as needed hydralazine ordered. Continue to observe hemoglobin, ensure no bleeding. Status post PRBCs this morning. Repeat labs this afternoon. Positive cumulative fluid balance, status post Lasix dosing. Continue to follow urine output Nephrology consultation GI consultation, question whether or not patient suffering intestinal ischemia.  Will check lactic acid. As for ongoing pain anxiety and delirium control will start low-dose Precedex. Discussed all of this with patient's family today at bedside.  This patient is critically ill with multiple organ system failure; which, requires  frequent high complexity decision making, assessment, support, evaluation, and titration of therapies. This was completed through the application of advanced monitoring technologies and extensive interpretation of multiple databases. During this encounter critical care time was devoted to patient care services described in this note for 32 minutes.  Garner Nash, DO [facility] 04/20/2020 2:17 PM

## 2020-04-20 NOTE — Progress Notes (Signed)
PT Cancellation Note  Patient Details Name: ALEJO BEAMER MRN: [id number] DOB: Sep 09, 1940   Cancelled Treatment:    Reason Eval/Treat Not Completed: Medical issues which prohibited therapy (Tachycardic, hypertensive and bleeding per nurse. HOLD today)   Denice Paradise 04/20/2020, 11:39 AM  Sakoya Win W,PT Acute Rehabilitation Services Pager:  [phone]  Office:  [phone]

## 2020-04-20 NOTE — Consult Note (Addendum)
Referring Provider:  [facility]         Primary Care Physician:  Ria Bush, MD Primary Gastroenterologist: Althia Forts           We were asked to see this patient for:  Abdominal pain and concern fo mesenteric ischemia            ASSESSMENT / PLAN:   79 y.o. male with a pmh significant for, not necessarily limited to: CAD, AAA s/ repair Oct 2021,  COPD, prostate cancer, diverticulosis  # AAA repair 04/17/20. Underwent open abdominal and common iliac artery aneurysm and right common femoral artery aneurysm repair with infrarenal aortatoleft hypogastric and transposition of the left external iliac artery bypass and right common femoral artery bypass with 16 x 8 mm Dacron. Hospital course complicated by metabolic encephalopathy, hypotension, AKI.   # Severe post-op abdominal pain, He can't give history ( on precedex) RN describes pain as left sided early today. Left > right abdominal tenderness on exam. Vascular Surgery concerned about mesenteric ischemia and inquiring about role of endoscopic evaluation in making diagnosis. He is at increased risk for endoscopic procedure in setting of possible ischemia. Await further recommendations from Dr. Loletha Carrow   .     # AKI, Nephrology has evaluated and feels AKI secondary to acute tubular injury in setting of hypotension.   # Macrocytic anemia. Hgb ~ 15 at baseline. Post-op hgb declined to around 10. Further decline to 7.4 this am , it was transfused 2 units of blood earlier today.      HPI:                                                                                                                             Chief Complaint: abdominal pain  Carlos Olson is a 79 y.o. male with a pmh significant for, not necessarily limited to: CAD, AAA s/ repair Oct 2021,  COPD, prostate cancer, diverticulosis  Patient is s/p right hypogastric artery aneurysm coiling on 04/06/20. He did well post-operatively. On 04/17/20 he was admitted for open  abdominal and common iliac artery aneurysm and right common femoral artery aneurysm repair with infrarenal aortatoleft hypogastric and transposition of the left external iliac artery bypass and right common femoral artery bypass with 16 x 8 mm Dacron. Hospital course complicated by metabolic encephalopathy hypotension requiring pressors,hypoxia,  AKI and Afib with RVR. . Cardiology is following. Off pressors now, actually having periods of HTN which is probably related to abdominal pain   Patient has been having severe abdominal pain today. Earlier,  per RN he was restless, holding his abdomen , moaning loudly. He is getting precedex. Vascular Surgery concerned about mesenteric ischemia.  Patient has an NGT, output dark green.. No lower GI bleeding.  WBC actually improving. Lactic acid level 1.1.    Past Medical History:  Diagnosis Date  . AAA (abdominal aortic aneurysm) ([place])   . Atherosclerosis of aortic arch ([place]) 09/25/2015  By CT scan   . CAD (coronary artery disease) 09/25/2015   Mild 3v by CT scan   . Cancer [facility])    prostate  . Colon polyps   . COPD (chronic obstructive pulmonary disease) ([place])   . Diverticulosis of colon   . Femoral bruit    bilateral, found by Dr. Jefm Bryant  . Heart murmur    child  . History of prostate cancer   . IBS (irritable bowel syndrome)    improved after retirement  . 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 Pelvic fracture ([place])   . Pneumonia    child  . Pulmonary nodules 09/25/2015   On screening CT scan, rpt 1 yr   . Shortness of breath    doe    Past Surgical History:  Procedure Laterality Date  . ANTERIOR CERVICAL DECOMP/DISCECTOMY FUSION  07/29/2011   Procedure: ANTERIOR CERVICAL DECOMPRESSION/DISCECTOMY FUSION 1 LEVEL;  Surgeon: Jessy Oto, MD;  Location: [place];  Service: Orthopedics;  Laterality: N/A;  Anterior Cervical Discectomy and Fusion C3-4  . AORTA - BILATERAL FEMORAL ARTERY BYPASS GRAFT  N/A 04/17/2020   Procedure: AORTA BIFEMORAL BYPASS GRAFT USING A HEMASHIELD GOLD BIFURCATED GRAFT;  Surgeon: Waynetta Sandy, MD;  Location: [place];  Service: Vascular;  Laterality: N/A;  . CATARACT EXTRACTION W/PHACO Right 07/10/2015   Procedure: CATARACT EXTRACTION PHACO AND INTRAOCULAR LENS PLACEMENT ([place]);  Surgeon: Birder Robson, MD;  Location: [facility];  Service: Ophthalmology;  Laterality: Right;  [place] 00:58   . CATARACT EXTRACTION W/PHACO Left 07/24/2015   Procedure: CATARACT EXTRACTION PHACO AND INTRAOCULAR LENS PLACEMENT ([facility]);  Surgeon: Birder Robson, MD;  Location: [facility];  Service: Ophthalmology;  Laterality: Left;  [place] 00:38   . COLONOSCOPY  12/2007   [place], diverticulosis and polyp, rpt 5 yrs  . dental implants    . EMBOLIZATION Right 03/26/2020   Procedure: EMBOLIZATION;  Surgeon: Waynetta Sandy, MD;  Location: [place] [facility];  Service: Cardiovascular;  Laterality: Right;  . FORAMINOTOMY 2 LEVEL Left 10/2013   C4/5, C5/6 ([place])  . HERNIA REPAIR     right 05/1999, left 09/04/05  . JOINT REPLACEMENT Right    TKR  . KNEE ARTHROPLASTY Left 05/20/2017   Procedure: COMPUTER ASSISTED TOTAL KNEE ARTHROPLASTY;  Surgeon: Dereck Leep, MD;  Location: [facility];  Service: Orthopedics;  Laterality: Left;  . KNEE ARTHROSCOPY  09/1997   left  . POSTERIOR CERVICAL FUSION/FORAMINOTOMY N/A 11/22/2013   Procedure: LEFT C4-5 AND C5-6 FORAMINOTOMY;  Surgeon: Jessy Oto, MD;  Location: [place];  Service: Orthopedics;  Laterality: N/A;  . PROSTATECTOMY  08/1990  . 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 TONSILLECTOMY      Prior to Admission medications   Medication Sig Start Date End Date Taking? Authorizing Provider  acetaminophen (TYLENOL) 500 MG tablet Take 1,000 mg by mouth every 6 (six) hours as needed for moderate pain or headache.    Yes [provider]  aspirin EC 81 MG tablet Take 1 tablet (81 mg total) by mouth daily. 01/27/19  Yes Ria Bush, MD  Fluticasone-Umeclidin-Vilant (TRELEGY ELLIPTA) 100-62.5-25 MCG/INH AEPB Inhale 1 puff into the lungs daily. 11/25/19  Yes Lauraine Rinne, NP  Multiple Vitamin (MULTIVITAMIN WITH MINERALS) TABS tablet Take 1 tablet by mouth daily. Multivitamin for Men   Yes [provider]  albuterol (VENTOLIN HFA) 108 (90 Base) MCG/ACT inhaler Inhale 2 puffs into the lungs every 6 (six)  hours as needed for wheezing or shortness of breath. 12/06/18   Fenton Foy, NP    Current Facility-Administered Medications  Medication Dose Route Frequency Provider Last Rate Last Admin  . 0.9 %  sodium chloride infusion  250 mL Intravenous Continuous Waynetta Sandy, MD   Held at 04/17/20 1643  . acetaminophen (OFIRMEV) IV 1,000 mg  1,000 mg Intravenous Q6H Merlene Laughter F, NP   Stopped at 04/20/20 1123  . acetaminophen (TYLENOL) 160 MG/5ML solution 325-650 mg  325-650 mg Per Tube Q4H PRN Waynetta Sandy, MD   650 mg at 04/19/20 2138   Or  . acetaminophen (TYLENOL) suppository 325-650 mg  325-650 mg Rectal Q4H PRN Waynetta Sandy, MD      . alum & mag hydroxide-simeth (MAALOX/MYLANTA) 200-200-20 MG/5ML suspension 15-30 mL  15-30 mL Per Tube Q2H PRN Waynetta Sandy, MD      . amiodarone (NEXTERONE PREMIX) 360-4.14 MG/200ML-% (1.8 mg/mL) IV infusion  30 mg/hr Intravenous Continuous Frederik Pear, MD 16.67 mL/hr at 04/20/20 1200 30 mg/hr at 04/20/20 1200  . atorvastatin (LIPITOR) tablet 10 mg  10 mg Per Tube QHS Waynetta Sandy, MD   10 mg at 04/19/20 2137  . bisacodyl (DULCOLAX) suppository 10 mg  10 mg Rectal Daily PRN Ulyses Amor, PA-C      . Chlorhexidine Gluconate Cloth 2 % PADS 6 each  6 each Topical Daily Waynetta Sandy, MD   6 each at 04/20/20 1003  . dexmedetomidine (PRECEDEX) 400 MCG/100ML (4 mcg/mL) infusion  0.4-1.2 mcg/kg/hr Intravenous Titrated Icard, Bradley L, DO 14.3 mL/hr at 04/20/20 1200 0.6 mcg/kg/hr at 04/20/20 1200  .  dextrose 5 %-0.9 % sodium chloride infusion   Intravenous Continuous Merlene Laughter F, NP 30 mL/hr at 04/20/20 1200 Rate Verify at 04/20/20 1200  . fluticasone furoate-vilanterol (BREO ELLIPTA) 100-25 MCG/INH 1 puff  1 puff Inhalation Daily Waynetta Sandy, MD      . guaiFENesin-dextromethorphan [facility] DM) 100-10 MG/5ML syrup 15 mL  15 mL Per Tube Q4H PRN Waynetta Sandy, MD      . hydrALAZINE (APRESOLINE) injection 5 mg  5 mg Intravenous Q20 Min PRN Laurence Slate M, PA-C   5 mg at 04/20/20 5784  . HYDROmorphone (DILAUDID) injection 0.5-1 mg  0.5-1 mg Intravenous Q4H PRN Merlene Laughter F, NP      . lidocaine (LIDODERM) 5 % 2 patch  2 patch Transdermal Q24H Merlene Laughter F, NP   2 patch at 04/20/20 1107  . MEDLINE mouth rinse  15 mL Mouth Rinse BID Waynetta Sandy, MD   15 mL at 04/20/20 1003  . metoprolol tartrate (LOPRESSOR) injection 5 mg  5 mg Intravenous 6 X Daily Dorris Carnes V, MD      . ondansetron [facility]) injection 4 mg  4 mg Intravenous Q6H PRN Laurence Slate M, PA-C      . pantoprazole (PROTONIX) injection 40 mg  40 mg Intravenous Q12H Fay Records, MD   40 mg at 04/20/20 0906  . phenol (CHLORASEPTIC) mouth spray 1 spray  1 spray Mouth/Throat PRN Laurence Slate M, PA-C      . risperiDONE (RISPERDAL M-TABS) disintegrating tablet 0.5 mg  0.5 mg Sublingual BID Waynetta Sandy, MD   0.5 mg at 04/20/20 1100  . sodium phosphate (FLEET) 7-19 GM/118ML enema 1 enema  1 enema Rectal Once PRN Ulyses Amor, PA-C        Allergies as of 04/06/2020 - Review Complete 04/06/2020  Allergen Reaction Noted  . Levaquin [levofloxacin]  03/30/2020  . Sulfonamide derivatives Itching 12/01/2006  . Sulfasalazine Itching 12/01/2006    Family History  Problem Relation Age of Onset  . Hypertension Mother   . Cancer Father        bone, prostate  . Diabetes Sister        x 40 years  . Cancer Brother        prostate    Social History   Socioeconomic  History  . Marital status: Married    Spouse name: Not on file  . Number of children: 2  . Years of education: Not on file  . Highest education level: Not on file  Occupational History  . Occupation: Medical illustrator, retired  Tobacco Use  . Smoking status: Former Smoker    Packs/day: 2.00    Years: 52.00    Pack years: 104.00    Types: Cigarettes    Quit date: 05/15/2007    Years since quitting: 12.9  . Smokeless tobacco: Never Used  . Tobacco comment: quit 06/16/2007/encouraged to remain smoke free  Vaping Use  . Vaping Use: Never used  Substance and Sexual Activity  . Alcohol use: Yes    Alcohol/week: 14.0 standard drinks    Types: 14 Standard drinks or equivalent per week    Comment: 2 drinks a day  . Drug use: No  . Sexual activity: Never  Other Topics Concern  . Not on file  Social History Narrative   Prefers I call him "[name]"   Lives with wife. Grown children.    Occupation: retired, was in Insurance underwriter business    Activity: home exercise routine dialy   Diet: some water, vegetables daily    Social Determinants of Radio broadcast assistant Strain:   . Difficulty of Paying Living Expenses: Not on file  Food Insecurity:   . Worried About Charity fundraiser in the Last Year: Not on file  . Ran Out of Food in the Last Year: Not on file  Transportation Needs:   . Lack of Transportation (Medical): Not on file  . Lack of Transportation (Non-Medical): Not on file  Physical Activity:   . Days of Exercise per Week: Not on file  . Minutes of Exercise per Session: Not on file  Stress:   . Feeling of Stress : Not on file  Social Connections:   . Frequency of Communication with Friends and Family: Not on file  . Frequency of Social Gatherings with Friends and Family: Not on file  . Attends Religious Services: Not on file  . Active Member of Clubs or Organizations: Not on file  . Attends Archivist Meetings: Not on file  . Marital Status: Not on file   Intimate Partner Violence:   . Fear of Current or Ex-Partner: Not on file  . Emotionally Abused: Not on file  . Physically Abused: Not on file  . Sexually Abused: Not on file    Review of Systems: Unable to obtain, partially sedated with Precedex   OBJECTIVE:    Physical Exam: Vital signs in last 24 hours: Temp:  [98 F (36.7 C)-100.6 F (38.1 C)] 99.4 F (37.4 C) (10/22 1130) Pulse Rate:  [82-148] 82 (10/22 1200) Resp:  [10-36] 14 (10/22 1200) BP: (97-176)/(32-102) 105/63 (10/22 1200) SpO2:  [77 %-99 %] 97 % (10/22 1200) Arterial Line BP: (123-236)/(30-70) 139/38 (10/22 1200) Last BM Date: (S)  (uta - PTA) General:   Partially sedated on Precedex.  Facial grimacing at times, frequent moaning. Daughter at bedside.  Nose:  No deformity, discharge. NGT in place. Neck:  Supple; no masses Lungs:  Shallow inspirations.  No wheezes heard.Marland Kitchen  Heart:  Tachycardic, irreg rhythm. No lower extremity edema Abdomen:  Soft, non-distended. No drainage from surgical incisions. Scattered abdominal bruising, moans louder with palpation of LLQ, hypoactive BS   Rectal:  Deferred  Msk:  Symmetrical without gross deformities. . Neurologic:  Alert and  oriented x4;  grossly normal neurologically. Skin:  Intact without significant lesions or rashes.  Filed Weights   04/17/20 0603  Weight: 95.3 kg     Scheduled inpatient medications . atorvastatin  10 mg Per Tube QHS  . Chlorhexidine Gluconate Cloth  6 each Topical Daily  . fluticasone furoate-vilanterol  1 puff Inhalation Daily  . lidocaine  2 patch Transdermal Q24H  . mouth rinse  15 mL Mouth Rinse BID  . metoprolol tartrate  5 mg Intravenous 6 X Daily  . pantoprazole (PROTONIX) IV  40 mg Intravenous Q12H  . risperiDONE  0.5 mg Sublingual BID      Intake/Output from previous day: 10/21 0701 - 10/22 0700 In: 3169.1 [I.V.:3169.1] Out: 1610 [Urine:1035] Intake/Output this shift: Total I/O In: 1447.5 [I.V.:837.3; Blood:500; IV  Piggyback:110.2] Out: 2255 [Urine:2250; Emesis/NG output:5]   Lab Results: Recent Labs    04/18/20 1711 04/18/20 1711 04/19/20 0250 04/19/20 0829 04/20/20 0229  WBC 18.4*  --  16.7*  --  13.0*  HGB 9.4*   < > 9.0* 8.8* 7.4*  HCT 29.8*   < > 28.5* 26.0* 23.8*  PLT 128*  --  125*  --  113*   < > = values in this interval not displayed.   BMET Recent Labs    04/19/20 0250 04/19/20 0250 04/19/20 0829 04/19/20 1654 04/20/20 0229  NA 143   < > 144 143 145  K 5.1   < > 5.0 5.0 4.7  CL 118*  --   --  119* 120*  CO2 16*  --   --  17* 16*  GLUCOSE 144*  --   --  138* 137*  BUN 41*  --   --  49* 50*  CREATININE 3.10*  --   --  2.95* 2.71*  CALCIUM 8.0*  --   --  7.8* 7.9*   < > = values in this interval not displayed.   LFT Recent Labs    04/18/20 0855  PROT 4.8*  ALBUMIN 2.9*  AST 28  ALT 14  ALKPHOS 34*  BILITOT 0.5   PT/INR Recent Labs    04/17/20 1357  LABPROT 14.9  INR 1.2   Hepatitis Panel No results for input(s): HEPBSAG, HCVAB, HEPAIGM, HEPBIGM in the last 72 hours.   . CBC Latest Ref Rng & Units 04/20/2020 04/19/2020 04/19/2020  WBC 4.0 - 10.5 K/uL 13.0(H) - 16.7(H)  Hemoglobin 13.0 - 17.0 g/dL 7.4(L) 8.8(L) 9.0(L)  Hematocrit 39 - 52 % 23.8(L) 26.0(L) 28.5(L)  Platelets 150 - 400 K/uL 113(L) - 125(L)    . CMP Latest Ref Rng & Units 04/20/2020 04/19/2020 04/19/2020  Glucose 70 - 99 mg/dL 137(H) 138(H) -  BUN 8 - 23 mg/dL 50(H) 49(H) -  Creatinine 0.61 - 1.24 mg/dL 2.71(H) 2.95(H) -  Sodium 135 - 145 mmol/L 145 143 144  Potassium 3.5 - 5.1 mmol/L 4.7 5.0 5.0  Chloride 98 - 111 mmol/L 120(H) 119(H) -  CO2 22 - 32 mmol/L 16(L) 17(L) -  Calcium 8.9 - 10.3 mg/dL  7.9(L) 7.8(L) -  Total Protein 6.5 - 8.1 g/dL - - -  Total Bilirubin 0.3 - 1.2 mg/dL - - -  Alkaline Phos 38 - 126 U/L - - -  AST 15 - 41 U/L - - -  ALT 0 - 44 U/L - - -   Studies/Results: US RENAL  Result Date: 04/19/2020 CLINICAL DATA:  Acute renal failure EXAM: RENAL / URINARY  TRACT ULTRASOUND COMPLETE COMPARISON:  Abdomen and pelvis CT 03/16/2019 FINDINGS: Right Kidney: Renal measurements: 11 x 6 x 7 cm = volume: 230 mL. Simple upper pole cyst measuring 2.4 cm. No hydronephrosis. Left Kidney: Renal measurements: 10 x 6 x 5 cm = volume: 150 mL. Echogenicity within normal limits. No mass or hydronephrosis visualized. Bladder: Collapsed around a Foley catheter. Other: Small volume perihepatic ascites and right pleural effusion. IMPRESSION: 1. No hydronephrosis.  The bladder is collapsed by a Foley catheter. 2. Trace right pleural effusion and ascites. Electronically Signed   By: Monte Fantasia M.D.   On: 04/19/2020 12:00    Active Problems:   AAA (abdominal aortic aneurysm) ([place])    Tye Savoy, NP-C @  04/20/2020, 1:03 PM  I have discussed the case with the PA. I personally interviewed and examined the patient, (who cannot provide helpful history right now due to his altered mental status), spoke with his daughter and his critical care nurse, and spoke with Dr. Donzetta Matters by phone this morning and again now after seeing the patient.  In addition to the above findings of physical exam, I note this patient has extensive lower abdominal wall and groin ecchymoses.   Complex patient post vascular surgery of open aortic aneurysm repair.  Patient has been moaning and visibly uncomfortable with abdominal pain, mental status has declined, he was initially hypotensive yesterday, now at times significantly hypertensive.  He is persistently tachycardic, hemoglobin has been dropping and nursing says there are variable and at times only Doppler detected distal pulses.  The main question to [place] was apparently if we thought ischemic colitis was occurring.  I suggested a serum lactate level, which came back normal at 1.1.  There is no rectal bleeding I think this patient probably does not have ischemic colitis, though that commonly after this type of surgery.  It would be much too high risk  to do any lower endoscopic procedure, and even if ischemic colitis were found, knowing that on endoscopy would not change the management which is supportive care. I do not know for sure, but I am concerned there may be some postoperative bleeding or other complication.  Perhaps the patient's understandable postoperative pain is enough to have negatively affected all of his hemodynamics, but that should be a diagnosis of exclusion.  This patient needs an abdominal CT scan.  A noncontrast CT scan certainly has its limitations and there would be expected postoperative findings.  However I am told that the nephrology consultants have recommended giving IV contrast if it is felt to be necessary for an optimal scan and diagnosis, even if that leads to renal dysfunction and the need for dialysis. Naturally, I must leave that ultimate decision to Dr. Donzetta Matters as the patient's attending physician.  I think that is as much help as I can be at this point, I remain concerned, and have discussed it at length with Pete's daughter.  Signing off - call as needed.  Nelida Meuse III Office: [phone]

## 2020-04-20 NOTE — Progress Notes (Addendum)
Progress Note    04/20/2020 2:33 PM 3 Days Post-Op  Subjective: Atrial fibrillation with rapid ventricular rate overnight controlled with amiodarone, having left lower quadrant greater than other quadrant abdominal pain today  Vitals:   04/20/20 1300 04/20/20 1310  BP: (!) 114/53 (!) 114/53  Pulse: 77 80  Resp: 15 19  Temp:  99 F (37.2 C)  SpO2: 97% 96%    Physical Exam: He does respond to questions although has to be aroused Abdomen is soft he does have significant tenderness throughout mostly in the left lower quadrant, there is minimal distention Right groin with ecchymosis no evidence of hematoma Strong signals bilateral posterior tibial arteries Feet are warm and neurologically intact  CBC    Component Value Date/Time   WBC 13.0 (H) 04/20/2020 0229   RBC 2.22 (L) 04/20/2020 0229   HGB 7.4 (L) 04/20/2020 0229   HGB 12.1 (L) 08/20/2012 0610   HCT 23.8 (L) 04/20/2020 0229   HCT 44.0 08/02/2012 0853   PLT 113 (L) 04/20/2020 0229   PLT 158 08/20/2012 0610   MCV 107.2 (H) 04/20/2020 0229   MCV 99 08/02/2012 0853   MCH 33.3 04/20/2020 0229   MCHC 31.1 04/20/2020 0229   RDW 14.1 04/20/2020 0229   RDW 13.7 08/02/2012 0853   LYMPHSABS 1.1 01/25/2019 0818   MONOABS 0.7 01/25/2019 0818   EOSABS 0.2 01/25/2019 0818   BASOSABS 0.1 01/25/2019 0818    BMET    Component Value Date/Time   NA 145 04/20/2020 0229   NA 136 08/20/2012 0610   K 4.7 04/20/2020 0229   K 4.2 08/20/2012 0610   CL 120 (H) 04/20/2020 0229   CL 102 08/20/2012 0610   CO2 16 (L) 04/20/2020 0229   CO2 26 08/20/2012 0610   GLUCOSE 137 (H) 04/20/2020 0229   GLUCOSE 123 (H) 08/20/2012 0610   BUN 50 (H) 04/20/2020 0229   BUN 7 08/20/2012 0610   CREATININE 2.71 (H) 04/20/2020 0229   CREATININE 0.75 08/20/2012 0610   CALCIUM 7.9 (L) 04/20/2020 0229   CALCIUM 8.4 (L) 08/20/2012 0610   GFRNONAA 23 (L) 04/20/2020 0229   GFRNONAA >60 08/20/2012 0610   GFRAA >60 05/07/2017 0949   GFRAA >60  08/20/2012 0610    INR    Component Value Date/Time   INR 1.2 04/17/2020 1357   INR 1.0 08/02/2012 0853     Intake/Output Summary (Last 24 hours) at 04/20/2020 1433 Last data filed at 04/20/2020 1323 Gross per 24 hour  Intake 4260.68 ml  Output 3015 ml  Net 1245.68 ml     Assessment:  79 y.o. male is postoperative day 3 open abdominal and common iliac artery aneurysm repair with right common femoral artery aneurysm repair with infrarenal aorta left hypogastric artery with transposition of the left external iliac artery bypass and right common femoral artery bypass.  Metabolic encephalopathy benzos have been held and minimizing opioids and Precedex being initiated by critical care  Overnight he was hypertensive with A. fib with RVR.  This has been followed by cardiology greatly appreciated  He has acute kidney injury with nephrology now following creatinine somewhat improved BUN remains elevated he has had good urine output renal artery duplex demonstrated good flow.  We are holding nephrotoxins at this time  Left lower quadrant abdominal pain certainly concerning for colonic ischemia given ligation of IMA and previous coiling of right hypogastric artery.  I have consulted GI for further recommendations.  Transfuse 2 units of red blood cells for  acute blood loss anemia, does not appear to be bleeding on exam given that he is hypertensive and not overly distended and groin is soft  I have kept his NG tube although he has had minimal input I have concerned that with his encephalopathy that he could aspirate.  Subcutaneous heparin and SCDs ordered.  Genavie Boettger C. Donzetta Matters, MD [facility] Office: [phone] Pager: [phone]  04/20/2020 2:33 PM

## 2020-04-20 NOTE — Consult Note (Signed)
Nephrology Consult  [facility]  Requesting provider: Thomes Lolling*  Assessment/Recommendations:   AKI: Likely secondary to acute tubular injury in the context of hypotension, worsening anemia -Baseline creatinine seems to be around 1 -Peak creatinine 3.1 on 10/21, improved to 2.7 -Urine output improved with Lasix challenge, use Lasix as needed.  Overall a good sign. Otherwise, clinically looks more hypovolemic exam, can utilize isotonic fluids -Renal ultrasound without obstruction -No indication for renal replacement therapy currently -Repeat UA with sediment exam -Continue to monitor daily Cr, Dose meds for GFR<15 -Monitor Daily I/Os, Daily weight  -Maintain MAP>65 for optimal renal perfusion.  -Agree with holding ACE-I, avoid further nephrotoxins including NSAIDS, Morphine.  Unless absolutely necessary, avoid CT with contrast and/or MRI with gadolinium.   Metabolic acidosis -Can be related to AKI but would recommend checking lactate.  Volume Status: Appears relatively on the drier side.  Would recommend gentle IV fluids in the interim, will defer to primary service  Hypertension: On metoprolol and hydralazine as needed  Anemia -Transfuse for Hgb<7 g/dL -Recommend ruling out an occult bleed  A. fib.  Currently on amnio drip, anticoagulation on hold given drop in hemoglobin  Encephalopathy: ICU delirium & benzos, management per primary service   Recommendations conveyed to primary service.    [facility] 04/20/2020 12:32 PM   _____________________________________________________________________________________   History of Present Illness: Carlos Olson is a/an 79 y.o. 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  Thereafter, he developed hypotension requiring phenylephrine until 10/20.  He has been having delirium reports as well.  His course was complicated by A. fib with RVR and acute kidney injury with peak creatinine of 3.1 and oliguria.  He was not producing much urine therefore received Lasix 80 mg IV x1 dose this morning and has already made well over 1 L of urine.  His wife is at bedside. Is currently having severe discomfort from abdominal, pelvic, and foot pain and will be going for another scan today.  Also receiving blood for worsening anemia. Interestingly, wife reports that he has aneurysms "all over".  His brother had the same issue and was diagnosed with an autoimmune disease before he passed away.   Medications:  Current Facility-Administered Medications  Medication Dose Route Frequency Provider Last Rate Last Admin  . 0.9 %  sodium chloride infusion  250 mL Intravenous Continuous Waynetta Sandy, MD   Held at 04/17/20 1643  . acetaminophen (OFIRMEV) IV 1,000 mg  1,000 mg Intravenous Q6H Merlene Laughter F, NP   Stopped at 04/20/20 1123  . acetaminophen (TYLENOL) 160 MG/5ML solution 325-650 mg  325-650 mg Per Tube Q4H PRN Waynetta Sandy, MD   650 mg at 04/19/20 2138   Or  . acetaminophen (TYLENOL) suppository 325-650 mg  325-650 mg Rectal Q4H PRN Waynetta Sandy, MD      . alum & mag hydroxide-simeth (MAALOX/MYLANTA) 200-200-20 MG/5ML suspension 15-30 mL  15-30 mL Per Tube Q2H PRN Waynetta Sandy, MD      . amiodarone (NEXTERONE PREMIX) 360-4.14 MG/200ML-% (1.8 mg/mL) IV infusion  30 mg/hr Intravenous Continuous Frederik Pear, MD 16.67 mL/hr at 04/20/20 1200 30 mg/hr at 04/20/20  1200  . atorvastatin (LIPITOR) tablet 10 mg  10 mg Per Tube QHS Waynetta Sandy, MD   10 mg at 04/19/20 2137  .  bisacodyl (DULCOLAX) suppository 10 mg  10 mg Rectal Daily PRN Ulyses Amor, PA-C      . Chlorhexidine Gluconate Cloth 2 % PADS 6 each  6 each Topical Daily Waynetta Sandy, MD   6 each at 04/20/20 1003  . dexmedetomidine (PRECEDEX) 400 MCG/100ML (4 mcg/mL) infusion  0.4-1.2 mcg/kg/hr Intravenous Titrated Icard, Bradley L, DO 14.3 mL/hr at 04/20/20 1200 0.6 mcg/kg/hr at 04/20/20 1200  . dextrose 5 %-0.9 % sodium chloride infusion   Intravenous Continuous Merlene Laughter F, NP 30 mL/hr at 04/20/20 1200 Rate Verify at 04/20/20 1200  . fluticasone furoate-vilanterol (BREO ELLIPTA) 100-25 MCG/INH 1 puff  1 puff Inhalation Daily Waynetta Sandy, MD      . guaiFENesin-dextromethorphan [facility] DM) 100-10 MG/5ML syrup 15 mL  15 mL Per Tube Q4H PRN Waynetta Sandy, MD      . hydrALAZINE (APRESOLINE) injection 5 mg  5 mg Intravenous Q20 Min PRN Laurence Slate M, PA-C   5 mg at 04/20/20 1610  . HYDROmorphone (DILAUDID) injection 0.5-1 mg  0.5-1 mg Intravenous Q4H PRN Merlene Laughter F, NP      . lidocaine (LIDODERM) 5 % 2 patch  2 patch Transdermal Q24H Merlene Laughter F, NP   2 patch at 04/20/20 1107  . MEDLINE mouth rinse  15 mL Mouth Rinse BID Waynetta Sandy, MD   15 mL at 04/20/20 1003  . metoprolol tartrate (LOPRESSOR) injection 5 mg  5 mg Intravenous 6 X Daily Dorris Carnes V, MD      . ondansetron [facility]) injection 4 mg  4 mg Intravenous Q6H PRN Laurence Slate M, PA-C      . pantoprazole (PROTONIX) injection 40 mg  40 mg Intravenous Q12H Fay Records, MD   40 mg at 04/20/20 0906  . phenol (CHLORASEPTIC) mouth spray 1 spray  1 spray Mouth/Throat PRN Laurence Slate M, PA-C      . risperiDONE (RISPERDAL M-TABS) disintegrating tablet 0.5 mg  0.5 mg Sublingual BID Waynetta Sandy, MD   0.5 mg at 04/20/20 1100  . sodium phosphate (FLEET) 7-19 GM/118ML enema 1 enema  1 enema Rectal Once PRN Ulyses Amor, PA-C         ALLERGIES Levaquin [levofloxacin],  Sulfonamide derivatives, and Sulfasalazine  MEDICAL HISTORY Past Medical History:  Diagnosis Date  . AAA (abdominal aortic aneurysm) ([place])   . Atherosclerosis of aortic arch ([place]) 09/25/2015   By CT scan   . CAD (coronary artery disease) 09/25/2015   Mild 3v by CT scan   . Cancer [facility])    prostate  . Colon polyps   . COPD (chronic obstructive pulmonary disease) ([place])   . Diverticulosis of colon   . Femoral bruit    bilateral, found by Dr. Jefm Bryant  . Heart murmur    child  . History of prostate cancer   . IBS (irritable bowel syndrome)    improved after retirement  . 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 Pelvic fracture ([place])   . Pneumonia    child  . Pulmonary nodules 09/25/2015   On screening CT scan, rpt 1 yr   . Shortness of breath    doe     SOCIAL HISTORY Social History   Socioeconomic History  . Marital status: Married  Spouse name: Not on file  . Number of children: 2  . Years of education: Not on file  . Highest education level: Not on file  Occupational History  . Occupation: Medical illustrator, retired  Tobacco Use  . Smoking status: Former Smoker    Packs/day: 2.00    Years: 52.00    Pack years: 104.00    Types: Cigarettes    Quit date: 05/15/2007    Years since quitting: 12.9  . Smokeless tobacco: Never Used  . Tobacco comment: quit 06/16/2007/encouraged to remain smoke free  Vaping Use  . Vaping Use: Never used  Substance and Sexual Activity  . Alcohol use: Yes    Alcohol/week: 14.0 standard drinks    Types: 14 Standard drinks or equivalent per week    Comment: 2 drinks a day  . Drug use: No  . Sexual activity: Never  Other Topics Concern  . Not on file  Social History Narrative   Prefers I call him "[name]"   Lives with wife. Grown children.    Occupation: retired, was in Insurance underwriter business    Activity: home exercise routine dialy   Diet: some water, vegetables daily    Social Determinants of  Radio broadcast assistant Strain:   . Difficulty of Paying Living Expenses: Not on file  Food Insecurity:   . Worried About Charity fundraiser in the Last Year: Not on file  . Ran Out of Food in the Last Year: Not on file  Transportation Needs:   . Lack of Transportation (Medical): Not on file  . Lack of Transportation (Non-Medical): Not on file  Physical Activity:   . Days of Exercise per Week: Not on file  . Minutes of Exercise per Session: Not on file  Stress:   . Feeling of Stress : Not on file  Social Connections:   . Frequency of Communication with Friends and Family: Not on file  . Frequency of Social Gatherings with Friends and Family: Not on file  . Attends Religious Services: Not on file  . Active Member of Clubs or Organizations: Not on file  . Attends Archivist Meetings: Not on file  . Marital Status: Not on file  Intimate Partner Violence:   . Fear of Current or Ex-Partner: Not on file  . Emotionally Abused: Not on file  . Physically Abused: Not on file  . Sexually Abused: Not on file     FAMILY HISTORY Family History  Problem Relation Age of Onset  . Hypertension Mother   . Cancer Father        bone, prostate  . Diabetes Sister        x 40 years  . Cancer Brother        prostate     No family history of kidney disease  Review of Systems: 12 systems reviewed Otherwise as per HPI, all other systems reviewed and negative  Physical Exam: Vitals:   04/20/20 1145 04/20/20 1200  BP: (!) 111/51 105/63  Pulse: 82 82  Resp: 15 14  Temp:    SpO2: 97% 97%   Total I/O In: 1447.5 [I.V.:837.3; Blood:500; IV Piggyback:110.2] Out: 2255 [Urine:2250; Emesis/NG output:5]  Intake/Output Summary (Last 24 hours) at 04/20/2020 1232 Last data filed at 04/20/2020 1200 Gross per 24 hour  Intake 3870.71 ml  Output 3015 ml  Net 855.71 ml   General: Uncomfortable appearing, delirious HEENT: anicteric sclera, oropharynx clear without lesions, dry  mucosal membranes CV: regular rate, normal rhythm,  no murmurs, no gallops, no rubs Lungs: Rhonchorous, tachypneic, no crackles, on 10 L nasal cannula Abd: soft, non-tender, non-distended Skin: no visible lesions or rashes Psych: alert, engaged, appropriate mood and affect Musculoskeletal: No edema Neuro: Delirious, not following all commands  Test Results Reviewed Lab Results  Component Value Date   NA 145 04/20/2020   K 4.7 04/20/2020   CL 120 (H) 04/20/2020   CO2 16 (L) 04/20/2020   BUN 50 (H) 04/20/2020   CREATININE 2.71 (H) 04/20/2020   GFR 75.60 01/25/2019   CALCIUM 7.9 (L) 04/20/2020   ALBUMIN 2.9 (L) 04/18/2020     I have reviewed all relevant outside healthcare records related to the patient's kidney injury.

## 2020-04-20 NOTE — Progress Notes (Signed)
Amiodarone Drug - Drug Interaction Consult Note  Recommendations: Monitor patient for signs of myopathy while on atorvastatin 10mg .  Monitor for bradycardia while on metoprolol. Monitor for hypokalemia while on furosemide. Monitor QTc while on risperidone.   Amiodarone is metabolized by the cytochrome P450 system and therefore has the potential to cause many drug interactions. Amiodarone has an average plasma half-life of 50 days (range 20 to 100 days).   There is potential for drug interactions to occur several weeks or months after stopping treatment and the onset of drug interactions may be slow after initiating amiodarone.   [x]  Statins: Increased risk of myopathy. Simvastatin- restrict dose to 20mg  daily. Other statins: counsel patients to report any muscle pain or weakness immediately.  []  Anticoagulants: Amiodarone can increase anticoagulant effect. Consider warfarin dose reduction. Patients should be monitored closely and the dose of anticoagulant altered accordingly, remembering that amiodarone levels take several weeks to stabilize.  []  Antiepileptics: Amiodarone can increase plasma concentration of phenytoin, the dose should be reduced. Note that small changes in phenytoin dose can result in large changes in levels. Monitor patient and counsel on signs of toxicity.  [x]  Beta blockers: increased risk of bradycardia, AV block and myocardial depression. Sotalol - avoid concomitant use.  []   Calcium channel blockers (diltiazem and verapamil): increased risk of bradycardia, AV block and myocardial depression.  []   Cyclosporine: Amiodarone increases levels of cyclosporine. Reduced dose of cyclosporine is recommended.  []  Digoxin dose should be halved when amiodarone is started.  [x]  Diuretics: increased risk of cardiotoxicity if hypokalemia occurs.  []  Oral hypoglycemic agents (glyburide, glipizide, glimepiride): increased risk of hypoglycemia. Patient's glucose levels should be  monitored closely when initiating amiodarone therapy.   [x]  Drugs that prolong the QT interval:  Torsades de pointes risk may be increased with concurrent use - avoid if possible.  Monitor QTc, also keep magnesium/potassium WNL if concurrent therapy can't be avoided. Marland Kitchen Antibiotics: e.g. fluoroquinolones, erythromycin. . Antiarrhythmics: e.g. quinidine, procainamide, disopyramide, sotalol. . Antipsychotics: e.g. phenothiazines, haloperidol.  . Lithium, tricyclic antidepressants, and methadone. Thank You,   Romilda Garret, PharmD PGY1 Acute Care Pharmacy Resident Phone: [phone] 04/20/2020 10:00 AM  Please check [email or web address] for unit specific pharmacy phone numbers.

## 2020-04-20 NOTE — Progress Notes (Signed)
eLink Physician-Brief Progress Note Patient Name: Carlos Olson DOB: [birth year] MRN: [id number]   Date of Service  04/20/2020  HPI/Events of Note  Afib with RVR.  eICU Interventions  Amiodarone bolus followed by infusion ordered.        Kerry Kass Matha Masse 04/20/2020, 3:33 AM

## 2020-04-21 ENCOUNTER — Inpatient Hospital Stay (HOSPITAL_COMMUNITY): Payer: Medicare HMO

## 2020-04-21 DIAGNOSIS — I714 Abdominal aortic aneurysm, without rupture: Secondary | ICD-10-CM | POA: Diagnosis not present

## 2020-04-21 DIAGNOSIS — N179 Acute kidney failure, unspecified: Secondary | ICD-10-CM | POA: Diagnosis not present

## 2020-04-21 DIAGNOSIS — N171 Acute kidney failure with acute cortical necrosis: Secondary | ICD-10-CM | POA: Diagnosis not present

## 2020-04-21 DIAGNOSIS — G9341 Metabolic encephalopathy: Secondary | ICD-10-CM

## 2020-04-21 DIAGNOSIS — I4891 Unspecified atrial fibrillation: Secondary | ICD-10-CM | POA: Diagnosis not present

## 2020-04-21 LAB — POCT I-STAT 7, (LYTES, BLD GAS, ICA,H+H)
Acid-base deficit: 5 mmol/L — ABNORMAL HIGH (ref 0.0–2.0)
Bicarbonate: 17.9 mmol/L — ABNORMAL LOW (ref 20.0–28.0)
Calcium, Ion: 1.2 mmol/L (ref 1.15–1.40)
HCT: 27 % — ABNORMAL LOW (ref 39.0–52.0)
Hemoglobin: 9.2 g/dL — ABNORMAL LOW (ref 13.0–17.0)
O2 Saturation: 83 %
Patient temperature: 99.9
Potassium: 3.5 mmol/L (ref 3.5–5.1)
Sodium: 151 mmol/L — ABNORMAL HIGH (ref 135–145)
TCO2: 19 mmol/L — ABNORMAL LOW (ref 22–32)
pCO2 arterial: 26.8 mmHg — ABNORMAL LOW (ref 32.0–48.0)
pH, Arterial: 7.435 (ref 7.350–7.450)
pO2, Arterial: 47 mmHg — ABNORMAL LOW (ref 83.0–108.0)

## 2020-04-21 LAB — BASIC METABOLIC PANEL
Anion gap: 12 (ref 5–15)
Anion gap: 12 (ref 5–15)
BUN: 52 mg/dL — ABNORMAL HIGH (ref 8–23)
BUN: 55 mg/dL — ABNORMAL HIGH (ref 8–23)
CO2: 17 mmol/L — ABNORMAL LOW (ref 22–32)
CO2: 17 mmol/L — ABNORMAL LOW (ref 22–32)
Calcium: 8.1 mg/dL — ABNORMAL LOW (ref 8.9–10.3)
Calcium: 8.4 mg/dL — ABNORMAL LOW (ref 8.9–10.3)
Chloride: 118 mmol/L — ABNORMAL HIGH (ref 98–111)
Chloride: 122 mmol/L — ABNORMAL HIGH (ref 98–111)
Creatinine, Ser: 2.44 mg/dL — ABNORMAL HIGH (ref 0.61–1.24)
Creatinine, Ser: 2.31 mg/dL — ABNORMAL HIGH (ref 0.61–1.24)
GFR, Estimated: 26 mL/min — ABNORMAL LOW (ref >60)
GFR, Estimated: 28 mL/min — ABNORMAL LOW (ref >60)
Glucose, Bld: 111 mg/dL — ABNORMAL HIGH (ref 70–99)
Glucose, Bld: 110 mg/dL — ABNORMAL HIGH (ref 70–99)
Potassium: 3.8 mmol/L (ref 3.5–5.1)
Potassium: 3.6 mmol/L (ref 3.5–5.1)
Sodium: 147 mmol/L — ABNORMAL HIGH (ref 135–145)
Sodium: 151 mmol/L — ABNORMAL HIGH (ref 135–145)

## 2020-04-21 LAB — COMPREHENSIVE METABOLIC PANEL
ALT: 18 U/L (ref 0–44)
AST: 40 U/L (ref 15–41)
Albumin: 1.8 g/dL — ABNORMAL LOW (ref 3.5–5.0)
Alkaline Phosphatase: 49 U/L (ref 38–126)
Anion gap: 9 (ref 5–15)
BUN: 53 mg/dL — ABNORMAL HIGH (ref 8–23)
CO2: 18 mmol/L — ABNORMAL LOW (ref 22–32)
Calcium: 8.1 mg/dL — ABNORMAL LOW (ref 8.9–10.3)
Chloride: 119 mmol/L — ABNORMAL HIGH (ref 98–111)
Creatinine, Ser: 2.33 mg/dL — ABNORMAL HIGH (ref 0.61–1.24)
GFR, Estimated: 28 mL/min — ABNORMAL LOW (ref >60)
Glucose, Bld: 124 mg/dL — ABNORMAL HIGH (ref 70–99)
Potassium: 3.8 mmol/L (ref 3.5–5.1)
Sodium: 146 mmol/L — ABNORMAL HIGH (ref 135–145)
Total Bilirubin: 0.7 mg/dL (ref 0.3–1.2)
Total Protein: 4.7 g/dL — ABNORMAL LOW (ref 6.5–8.1)

## 2020-04-21 LAB — CBC
HCT: 27.3 % — ABNORMAL LOW (ref 39.0–52.0)
HCT: 27.7 % — ABNORMAL LOW (ref 39.0–52.0)
HCT: 29.7 % — ABNORMAL LOW (ref 39.0–52.0)
Hemoglobin: 9 g/dL — ABNORMAL LOW (ref 13.0–17.0)
Hemoglobin: 9.3 g/dL — ABNORMAL LOW (ref 13.0–17.0)
Hemoglobin: 9.9 g/dL — ABNORMAL LOW (ref 13.0–17.0)
MCH: 32.7 pg (ref 26.0–34.0)
MCH: 33 pg (ref 26.0–34.0)
MCH: 32.8 pg (ref 26.0–34.0)
MCHC: 33 g/dL (ref 30.0–36.0)
MCHC: 33.6 g/dL (ref 30.0–36.0)
MCHC: 33.3 g/dL (ref 30.0–36.0)
MCV: 99.3 fL (ref 80.0–100.0)
MCV: 98.2 fL (ref 80.0–100.0)
MCV: 98.3 fL (ref 80.0–100.0)
Platelets: 114 10*3/uL — ABNORMAL LOW (ref 150–400)
Platelets: 114 10*3/uL — ABNORMAL LOW (ref 150–400)
Platelets: 127 10*3/uL — ABNORMAL LOW (ref 150–400)
RBC: 2.75 MIL/uL — ABNORMAL LOW (ref 4.22–5.81)
RBC: 2.82 MIL/uL — ABNORMAL LOW (ref 4.22–5.81)
RBC: 3.02 MIL/uL — ABNORMAL LOW (ref 4.22–5.81)
RDW: 16.7 % — ABNORMAL HIGH (ref 11.5–15.5)
RDW: 16.5 % — ABNORMAL HIGH (ref 11.5–15.5)
RDW: 16.8 % — ABNORMAL HIGH (ref 11.5–15.5)
WBC: 14.2 10*3/uL — ABNORMAL HIGH (ref 4.0–10.5)
WBC: 16.1 10*3/uL — ABNORMAL HIGH (ref 4.0–10.5)
WBC: 18 10*3/uL — ABNORMAL HIGH (ref 4.0–10.5)
nRBC: 0 % (ref 0.0–0.2)
nRBC: 0 % (ref 0.0–0.2)
nRBC: 0 % (ref 0.0–0.2)

## 2020-04-21 LAB — GLUCOSE, CAPILLARY
Glucose-Capillary: 105 mg/dL — ABNORMAL HIGH (ref 70–99)
Glucose-Capillary: 108 mg/dL — ABNORMAL HIGH (ref 70–99)
Glucose-Capillary: 99 mg/dL (ref 70–99)
Glucose-Capillary: 99 mg/dL (ref 70–99)

## 2020-04-21 LAB — MAGNESIUM: Magnesium: 2.4 mg/dL (ref 1.7–2.4)

## 2020-04-21 LAB — LIPASE, BLOOD: Lipase: 36 U/L (ref 11–51)

## 2020-04-21 LAB — LACTIC ACID, PLASMA: Lactic Acid, Venous: 1 mmol/L (ref 0.5–1.9)

## 2020-04-21 LAB — AMMONIA: Ammonia: 38 umol/L — ABNORMAL HIGH (ref 9–35)

## 2020-04-21 MED ORDER — LIDOCAINE 5 % EX PTCH
2.0000 | MEDICATED_PATCH | CUTANEOUS | Status: DC
  Administered 2020-04-21 – 2020-05-17 (×19): 2 via TRANSDERMAL
  Filled 2020-04-21 (×35): qty 2

## 2020-04-21 MED ORDER — HALOPERIDOL LACTATE 5 MG/ML IJ SOLN
2.0000 mg | Freq: Once | INTRAMUSCULAR | Status: AC
  Administered 2020-04-21: 2 mg via INTRAVENOUS
  Filled 2020-04-21: qty 1

## 2020-04-21 MED ORDER — MIDAZOLAM HCL 2 MG/2ML IJ SOLN
2.0000 mg | Freq: Once | INTRAMUSCULAR | Status: AC
  Administered 2020-04-21: 2 mg via INTRAVENOUS
  Filled 2020-04-21: qty 2

## 2020-04-21 MED ORDER — POTASSIUM CHLORIDE 10 MEQ/100ML IV SOLN
10.0000 meq | INTRAVENOUS | Status: AC
  Administered 2020-04-21 (×2): 10 meq via INTRAVENOUS
  Filled 2020-04-21: qty 100

## 2020-04-21 NOTE — Progress Notes (Signed)
    Subjective  - POD #4  Required blood yesterday Precedex increased to help with agitation C/o abd pain and right groin pain   Physical Exam:  BRisk PT signals abd soft but tender, no discolration Slight fullness in right groin c/w seroma       Assessment/Plan:  POD #4  CV:  Remains in AFIB.  On Amio.  Cardiology following Renal:  Cr elevated at 2.44.  Excellent UOP.  Renal following Heme:  2 units PRBC yesterday.  Hb stable today.  Continue to follow GI:  NG with 1L output.  On abx for possible ischemic colitis, however without diarrhea or bloody stools and normal WBC and lactate, this is looking less likely. Unfortunately, diagnostic imaging is limited given his renal issues. GI following ID:  On Zosyn Prophylaxis:  SQ heparin and protonix Repeat CBC and BMP this afternoon Daughter updated at bedside  Carlos Olson 04/21/2020 7:44 AM --  Vitals:   04/21/20 0700 04/21/20 0734  BP: 96/62   Pulse: 87   Resp: 20   Temp:  98.5 F (36.9 C)  SpO2: 96%     Intake/Output Summary (Last 24 hours) at 04/21/2020 0744 Last data filed at 04/21/2020 0600 Gross per 24 hour  Intake 3085.47 ml  Output 5435 ml  Net -2349.53 ml     Laboratory CBC    Component Value Date/Time   WBC 14.2 (H) 04/21/2020 0236   HGB 9.0 (L) 04/21/2020 0236   HGB 12.1 (L) 08/20/2012 0610   HCT 27.3 (L) 04/21/2020 0236   HCT 44.0 08/02/2012 0853   PLT 114 (L) 04/21/2020 0236   PLT 158 08/20/2012 0610    BMET    Component Value Date/Time   NA 147 (H) 04/21/2020 0236   NA 136 08/20/2012 0610   K 3.8 04/21/2020 0236   K 4.2 08/20/2012 0610   CL 118 (H) 04/21/2020 0236   CL 102 08/20/2012 0610   CO2 17 (L) 04/21/2020 0236   CO2 26 08/20/2012 0610   GLUCOSE 111 (H) 04/21/2020 0236   GLUCOSE 123 (H) 08/20/2012 0610   BUN 52 (H) 04/21/2020 0236   BUN 7 08/20/2012 0610   CREATININE 2.44 (H) 04/21/2020 0236   CREATININE 0.75 08/20/2012 0610   CALCIUM 8.1 (L) 04/21/2020 0236    CALCIUM 8.4 (L) 08/20/2012 0610   GFRNONAA 26 (L) 04/21/2020 0236   GFRNONAA >60 08/20/2012 0610   GFRAA >60 05/07/2017 0949   GFRAA >60 08/20/2012 0610    COAG Lab Results  Component Value Date   INR 1.2 04/17/2020   INR 1.0 04/11/2020   INR 0.97 05/07/2017   No results found for: PTT  Antibiotics Anti-infectives (From admission, onward)   Start     Dose/Rate Route Frequency Ordered Stop   04/20/20 1700  piperacillin-tazobactam (ZOSYN) IVPB 3.375 g        3.375 g 12.5 mL/hr over 240 Minutes Intravenous Every 8 hours 04/20/20 1602     04/17/20 2000  ceFAZolin (ANCEF) IVPB 2g/100 mL premix        2 g 200 mL/hr over 30 Minutes Intravenous Every 8 hours 04/17/20 1541 04/18/20 0430   04/17/20 0554  ceFAZolin (ANCEF) IVPB 2g/100 mL premix        2 g 200 mL/hr over 30 Minutes Intravenous 30 min pre-op 04/17/20 0554 04/17/20 1211       V. Leia Alf, M.D., [facility] [place] Office: [phone] Pager:  [phone]

## 2020-04-21 NOTE — Plan of Care (Signed)
  Problem: Clinical Measurements: Goal: Ability to maintain clinical measurements within normal limits will improve Outcome: Progressing Goal: Will remain free from infection Outcome: Progressing Goal: Diagnostic test results will improve Outcome: Progressing Goal: Respiratory complications will improve Outcome: Progressing Goal: Cardiovascular complication will be avoided Outcome: Progressing   Problem: Activity: Goal: Risk for activity intolerance will decrease Outcome: Progressing   Problem: Coping: Goal: Level of anxiety will decrease Outcome: Progressing   Problem: Elimination: Goal: Will not experience complications related to bowel motility Outcome: Progressing Goal: Will not experience complications related to urinary retention Outcome: Progressing   Problem: Pain Managment: Goal: General experience of comfort will improve Outcome: Progressing   Problem: Safety: Goal: Ability to remain free from injury will improve Outcome: Progressing   Problem: Skin Integrity: Goal: Risk for impaired skin integrity will decrease Outcome: Progressing   Problem: Education: Goal: Knowledge of the prescribed therapeutic regimen will improve Outcome: Progressing   Problem: Bowel/Gastric: Goal: Gastrointestinal status for postoperative course will improve Outcome: Progressing   Problem: Cardiac: Goal: Ability to maintain an adequate cardiac output will improve Outcome: Progressing   Problem: Clinical Measurements: Goal: Postoperative complications will be avoided or minimized Outcome: Progressing   Problem: Respiratory: Goal: Respiratory status will improve Outcome: Progressing   Problem: Urinary Elimination: Goal: Ability to achieve and maintain adequate renal perfusion and functioning will improve Outcome: Progressing   Problem: Education: Goal: Knowledge of General Education information will improve Description: Including pain rating scale, medication(s)/side  effects and non-pharmacologic comfort measures Outcome: Not Progressing   Problem: Health Behavior/Discharge Planning: Goal: Ability to manage health-related needs will improve Outcome: Not Progressing   Problem: Nutrition: Goal: Adequate nutrition will be maintained Outcome: Not Progressing

## 2020-04-21 NOTE — Progress Notes (Signed)
[facility] Progress Note    Assessment/ Plan:   AKI: Likely secondary to acute tubular injury in the context of hypotension, worsening anemia -Baseline creatinine seems to be around 1 -Peak creatinine 3.1 on 10/21, improved to 2.3 today -Urine output improved with Lasix challenge, use Lasix as needed.  Overall a good sign. Otherwise, clinically looks more hypovolemic exam, can utilize isotonic fluids if needed. If urine output drops off again can re-trial lasix 80mg  iv x 1 dose -Renal ultrasound without obstruction -No indication for renal replacement therapy currently -Repeat UA with sediment exam -Continue to monitor daily Cr, Dose meds for GFR<15 -Monitor Daily I/Os, Daily weight  -Maintain MAP>65 for optimal renal perfusion.  -Agree with holding ACE-I, avoid further nephrotoxins including NSAIDS, Morphine.  Unless absolutely necessary, avoid CT with contrast and/or MRI with gadolinium.   Metabolic acidosis -related to aki, lactate 1.0  H/o HTN: intermittently hypotensive here, rate ctrl for afib  Anemia -Transfuse for Hgb<7 g/dL  A. fib.  Currently on amnio drip, anticoagulation on hold given drop in hemoglobin, rvr episodes secondary to agitation?  Encephalopathy: ICU delirium & benzos, management per primary service. 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 abd pain still present. Required prbc yesterday   Objective:   BP 110/69   Pulse 97   Temp 99.3 F (37.4 C)   Resp 19   Ht 6\' 3"  (1.905 m)   Wt 95.3 kg   SpO2 98%   BMI 26.25 kg/m   Intake/Output Summary (Last 24 hours) at 04/21/2020 1213 Last data filed at 04/21/2020 0900 Gross per 24 hour  Intake 1979.19 ml  Output 3035 ml  Net -1055.81 ml   Weight change:   Physical Exam: Gen:ill  appearing NKN:LZJQB irreg Resp:normal wob HAL:PFXT Ext:no significant edema Neuro: confused  Imaging: No results found.  Labs: BMET Recent Labs  Lab 04/19/20 0250 04/19/20 0250 04/19/20 0829 04/19/20 1654 04/20/20 0229 04/20/20 1515 04/20/20 1955 04/21/20 0236 04/21/20 0850  NA 143   < > 144 143 145 147* 146* 147* 146*  K 5.1   < > 5.0 5.0 4.7 4.2 3.9 3.8 3.8  CL 118*  --   --  119* 120* 120* 118* 118* 119*  CO2 16*  --   --  17* 16* 17* 17* 17* 18*  GLUCOSE 144*  --   --  138* 137* 120* 123* 111* 124*  BUN 41*  --   --  49* 50* 47* 49* 52* 53*  CREATININE 3.10*  --   --  2.95* 2.71* 2.45* 2.42* 2.44* 2.33*  CALCIUM 8.0*  --   --  7.8* 7.9* 8.4* 8.2* 8.1* 8.1*   < > = values in this interval not displayed.   CBC Recent Labs  Lab 04/20/20 1515 04/20/20 1955 04/21/20 0236 04/21/20 0850  WBC 15.5* 14.1* 14.2* 16.1*  HGB 9.8* 9.1* 9.0* 9.3*  HCT 29.8* 27.8* 27.3* 27.7*  MCV 99.0 98.6 99.3 98.2  PLT 122* 113* 114* 114*    Medications:    . atorvastatin  10 mg Per Tube QHS  . Chlorhexidine Gluconate Cloth  6 each Topical Daily  . fluticasone furoate-vilanterol  1 puff Inhalation Daily  . heparin  5,000 Units Subcutaneous Q8H  . lidocaine  2 patch Transdermal Q24H  . mouth rinse  15 mL Mouth  Rinse BID  . metoprolol tartrate  5 mg Intravenous 6 X Daily  . pantoprazole (PROTONIX) IV  40 mg Intravenous Q12H  . risperiDONE  0.5 mg Sublingual BID      Gean Quint, MD [facility] 04/21/2020, 12:13 PM

## 2020-04-21 NOTE — Progress Notes (Signed)
Progress Note   Subjective   The patient remains quite ill.  Abdominal pain of unclear etiology is ongoing.  He has been agitated at times.   No CP or SOB.  Remains in afib.  Inpatient Medications    Scheduled Meds: . atorvastatin  10 mg Per Tube QHS  . Chlorhexidine Gluconate Cloth  6 each Topical Daily  . fluticasone furoate-vilanterol  1 puff Inhalation Daily  . heparin  5,000 Units Subcutaneous Q8H  . lidocaine  2 patch Transdermal Q24H  . mouth rinse  15 mL Mouth Rinse BID  . metoprolol tartrate  5 mg Intravenous 6 X Daily  . pantoprazole (PROTONIX) IV  40 mg Intravenous Q12H  . risperiDONE  0.5 mg Sublingual BID   Continuous Infusions: . sodium chloride Stopped (04/17/20 1643)  . amiodarone 30 mg/hr (04/21/20 0900)  . dexmedetomidine (PRECEDEX) IV infusion 1.2 mcg/kg/hr (04/21/20 0900)  . dextrose 5 % and 0.9% NaCl Stopped (04/20/20 1833)  . piperacillin-tazobactam (ZOSYN)  IV 12.5 mL/hr at 04/21/20 0900   PRN Meds: acetaminophen **OR** acetaminophen, alum & mag hydroxide-simeth, bisacodyl, guaiFENesin-dextromethorphan, hydrALAZINE, HYDROmorphone (DILAUDID) injection, ondansetron, phenol, sodium phosphate   Vital Signs    Vitals:   04/21/20 0734 04/21/20 0800 04/21/20 0900 04/21/20 0917  BP:  113/63 (!) 109/58   Pulse:  85 98   Resp:  20 (!) 23   Temp: 98.5 F (36.9 C)     TempSrc:      SpO2:  96% 94% 97%  Weight:      Height:        Intake/Output Summary (Last 24 hours) at 04/21/2020 0933 Last data filed at 04/21/2020 0900 Gross per 24 hour  Intake 2903.92 ml  Output 4685 ml  Net -1781.08 ml   Filed Weights   04/17/20 0603  Weight: 95.3 kg    Telemetry    Rate controlled afib - Personally Reviewed  Physical Exam   GEN- The patient is ill appearing, alert but confused Head- normocephalic, atraumatic Eyes-  Sclera clear, conjunctiva pink Ears- hearing intact Oropharynx- clear Neck- supple, Lungs-  normal work of breathing Heart-  irregular rate and rhythm  GI- soft, Extremities- no clubbing, cyanosis, or edema  MS- no significant deformity or atrophy Skin- no rash or lesion   Labs    Chemistry Recent Labs  Lab 04/18/20 0855 04/18/20 1702 04/20/20 1515 04/20/20 1955 04/21/20 0236  NA 142   < > 147* 146* 147*  K 5.1   < > 4.2 3.9 3.8  CL 115*   < > 120* 118* 118*  CO2 16*   < > 17* 17* 17*  GLUCOSE 155*   < > 120* 123* 111*  BUN 29*   < > 47* 49* 52*  CREATININE 2.42*   < > 2.45* 2.42* 2.44*  CALCIUM 8.1*   < > 8.4* 8.2* 8.1*  PROT 4.8*  --   --  4.6*  --   ALBUMIN 2.9*  --   --  2.0*  --   AST 28  --   --  45*  --   ALT 14  --   --  21  --   ALKPHOS 34*  --   --  48  --   BILITOT 0.5  --   --  0.8  --   GFRNONAA 24*   < > 26* 26* 26*  ANIONGAP 11   < > 10 11 12    < > = values in this interval not displayed.  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 Assessment & Plan    1.  afib Secondary to acute medical illness Rates are reasonably controlled Not a candidate for anticoagulation currently Continue IV amiodarone at this time Given soft BP, I would not try aggressive rate control at this time.  I think as his delirium/ pain improve, his tachycardia will also  2. HTN He is intermittently hypotensive, though current MAPs are 70s No changes at this time  3. Acute renal failure Urine output remains low Nephrology has been consulted Normal renal US  noted  4. delirium Continues  5. Thrombocytopenia/ anemia Continue to follow closely PCCM is on board S/p PRBCs yesterday,  Hb stable today    Critical care time was exclusive of separate billable procedures and treating other patients.  Critial care time was spent personally by me (independant of midlevel providers) on the following activities: development of treatment plan with as nursing, evaluation of patients response to treatment, examining patient, reviewing treatments/ interventions, lab studies, radiographic studies, pulse ox, and re-evaluation of patients condition.     The patient is critically ill with multiple organ systems failure and requires high complexity decision making for assessment and support, frequent evaluation and titration of therapies, application of advanced monitoring technologies and extensive interpretation of databases.   Critical care was necessary to treat or prevent immintent or life-threatening deterioration.    Total CCT spent directly with the patient today is 35  minutes  Thompson Grayer MD, [facility] 04/21/2020 9:42 AM

## 2020-04-21 NOTE — Plan of Care (Signed)
  Problem: Clinical Measurements: Goal: Will remain free from infection Outcome: Progressing   Problem: Elimination: Goal: Will not experience complications related to urinary retention Outcome: Progressing   Problem: Safety: Goal: Ability to remain free from injury will improve Outcome: Progressing   Problem: Skin Integrity: Goal: Risk for impaired skin integrity will decrease Outcome: Progressing   Problem: Cardiac: Goal: Ability to maintain an adequate cardiac output will improve Outcome: Progressing   Problem: Skin Integrity: Goal: Demonstration of wound healing without infection will improve Outcome: Progressing   Problem: Urinary Elimination: Goal: Ability to achieve and maintain adequate renal perfusion and functioning will improve Outcome: Progressing   Problem: Education: Goal: Knowledge of General Education information will improve Description: Including pain rating scale, medication(s)/side effects and non-pharmacologic comfort measures Outcome: Not Progressing   Problem: Health Behavior/Discharge Planning: Goal: Ability to manage health-related needs will improve Outcome: Not Progressing   Problem: Clinical Measurements: Goal: Ability to maintain clinical measurements within normal limits will improve Outcome: Not Progressing Goal: Diagnostic test results will improve Outcome: Not Progressing Goal: Respiratory complications will improve Outcome: Not Progressing Goal: Cardiovascular complication will be avoided Outcome: Not Progressing   Problem: Activity: Goal: Risk for activity intolerance will decrease Outcome: Not Progressing   Problem: Nutrition: Goal: Adequate nutrition will be maintained Outcome: Not Progressing   Problem: Coping: Goal: Level of anxiety will decrease Outcome: Not Progressing   Problem: Elimination: Goal: Will not experience complications related to bowel motility Outcome: Not Progressing   Problem: Pain  Managment: Goal: General experience of comfort will improve Outcome: Not Progressing   Problem: Education: Goal: Knowledge of the prescribed therapeutic regimen will improve Outcome: Not Progressing   Problem: Bowel/Gastric: Goal: Gastrointestinal status for postoperative course will improve Outcome: Not Progressing   Problem: Clinical Measurements: Goal: Postoperative complications will be avoided or minimized Outcome: Not Progressing   Problem: Respiratory: Goal: Respiratory status will improve Outcome: Not Progressing

## 2020-04-21 NOTE — Progress Notes (Signed)
NAME:  Carlos Olson, MRN:  [id number], DOB:  June 25, 1941, LOS: 4 ADMISSION DATE:  04/17/2020, CONSULTATION DATE: 10/21 REFERRING MD: Dr. Donzetta Matters, CHIEF COMPLAINT: AKI with mild hypoxia  Brief History   79 year old male who presented 10/18 for elective endovascular repair abdominal aortic aneurysm with Dr. Donzetta Matters. PCCM consulted 10/21 for assistance in medical management given AKI and mild hypoxia  History of present illness   Carlos Olson is a 79 year old male with a past medical history significant for AAA, history of prostate cancer, heart murmur, diverticulitis, COPD, CAD, and atherosclerosis who presented for elective endovascular repair of abdominal aortic aneurysm with Dr. Donzetta Matters. Overnight postop day 0 patient required low-dose pressor support for mild hypotension, vascular surgery consulted cardiology initially for assistance of hypotension.  Morning of 10/21 patient was seen with worsening creatinine with rise from 1.19-3.10 10/21 and decreasing urine output with approximately 245 mL of urine out last 24 hours. Additionally he was seen with worsening confusion after receiving opioid administration. Pressor support discontinued evening of 12/20. Given worsened AKI and mild hypoxia PCCM consulted for further medical management.  Past Medical History  AAA History of prostate cancer Heart murmur Diverticulitis COPD CAD atherosclerosis  Significant Hospital Events   Admitted 10/18 for elective AAA repair  Consults:  Cardiology PCCM  Procedures:  Endovascular AAA repair 10/19  Significant Diagnostic Tests:    Micro Data:    Antimicrobials:    Interim history/subjective:   Patient remains confused.  Abdominal pain appears better.  Remains on Precedex infusion.  Objective   Blood pressure 96/62, pulse 87, temperature 98.5 F (36.9 C), resp. rate 20, height 6\' 3"  (1.905 m), weight 95.3 kg, SpO2 96 %.        Intake/Output Summary (Last 24 hours) at 04/21/2020 0914 Last  data filed at 04/21/2020 0600 Gross per 24 hour  Intake 2712.72 ml  Output 4630 ml  Net -1917.28 ml   Filed Weights   04/17/20 0603  Weight: 95.3 kg    Examination: General: Ill-appearing elderly male, resting in bed daughter at bedside HEENT: Tracking appropriately NCAT Neuro: Alert to voice, opens eyes follows basic commands CV: Irregular irregular, S1-S2 PULM: Bilateral breath sounds, clear, no wheeze no rhonchi GI: Soft, nontender nondistended bowel sounds present, belly much softer than yesterday Extremities: Lower extremities are warm as compared to yesterday Skin: No obvious rash no discoloration, surgical sites CDI on right groin and abdomen.  Resolved Hospital Problem list     Assessment & Plan:   Acute Kidney Injury with oliguria -Lab work on admission 10/19 revealed creatinine of 1.13 and GFR 58, morning of PCCM consultation creatinine is now 3.10 with GFR 18 -Patient is currently 5 L fluid positive Metabolic acidosis P: We appreciate nephrology input. Continue to observe No additional Lasix today Good urine output yesterday. Continue to follow BNP. Avoid nephrotoxic agents  Acute metabolic encephalopathy -Likely multifactorial including ICU delirium and benzodiazepine utilization in elderly patient -Likely component of ICU delirium P: Avoid benzodiazepines Continue Precedex As needed dose of Haldol given today.  History of COPD not currently in acute exasperation Former smoker History of pulmonary nodules -Primary pulmonologist is Dr. Lamonte Sakai, last seen in pulmonary clinic 11/25/2019 P: Continue bronchodilators as needed.  Intermittent tachycardia with possible underlying atrial fibrillation -Patient seen with frequent ectopy and tachycardia overnight of 10/21, rhythm improved after receiving labetalol -Developed A. fib RVR overnight 10/21 P: Appreciate cardiology input Continue IV amiodarone Remains on telemetry As needed beta-blockers for rate  control. Holding blood pressure  medications at this time.  Combined systolic and diastolic congestive heart failure -Patient underwent preop echocardiogram that revealed EF of 50% with global hypokinesis of left ventricle and grade 1 diastolic dysfunction History of CAD P: Diuresis for volume status. Holding additional diuresis today as he had good output yesterday.  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  Care discussed with multidisciplinary team, nursing nephrology and vascular surgery this morning on rounds.  Best practice:  Diet: NPO Pain/Anxiety/Delirium protocol (if indicated): PRN VAP protocol (if indicated): N/A DVT prophylaxis: Heparin GI prophylaxis: PPI Glucose control: Monitor  Mobility: Bedrest Code Status: Full Family Communication: Up with assistance  Disposition: ICU  Labs   CBC: Recent Labs  Lab 04/19/20 0250 04/19/20 0250 04/19/20 0829 04/20/20 0229 04/20/20 1515 04/20/20 1955 04/21/20 0236  WBC 16.7*  --   --  13.0* 15.5* 14.1* 14.2*  HGB 9.0*   < > 8.8* 7.4* 9.8* 9.1* 9.0*  HCT 28.5*   < > 26.0* 23.8* 29.8* 27.8* 27.3*  MCV 106.7*  --   --  107.2* 99.0 98.6 99.3  PLT 125*  --   --  113* 122* 113* 114*   < > = values in this interval not displayed.    Basic Metabolic Panel: Recent Labs  Lab 04/17/20 1357 04/17/20 1630 04/18/20 0855 04/18/20 1702 04/19/20 0250 04/19/20 0829 04/19/20 1654 04/20/20 0229 04/20/20 1515 04/20/20 1955 04/21/20 0236  NA 141   < > 142   < > 143   < > 143 145 147* 146* 147*  K 4.5   < > 5.1   < > 5.1   < > 5.0 4.7 4.2 3.9 3.8  CL 111   < > 115*   < > 118*  --  119* 120*  120* 118* 118*  CO2 20*   < > 16*   < > 16*  --  17* 16* 17* 17* 17*  GLUCOSE 202*   < > 155*   < > 144*  --  138* 137* 120* 123* 111*  BUN 18   < > 29*   < > 41*  --  49* 50* 47* 49* 52*  CREATININE 1.19   < > 2.42*   < > 3.10*  --  2.95* 2.71* 2.45* 2.42* 2.44*  CALCIUM 8.7*   < > 8.1*   < > 8.0*  --  7.8* 7.9* 8.4* 8.2* 8.1*  MG 1.7  --  1.5*  --  2.5*  --   --  2.5*  --   --   --    < > = values in this interval not displayed.   GFR: Estimated Creatinine Clearance: 28.9 mL/min (A) (by C-G formula based on SCr of 2.44 mg/dL (H)). Recent Labs  Lab 04/20/20 0229 04/20/20 1159 04/20/20 1515 04/20/20 1949 04/20/20 1955 04/21/20 0236  WBC 13.0*  --  15.5*  --  14.1* 14.2*  LATICACIDVEN  --  1.1  --  1.0  --   --     Liver Function Tests: Recent Labs  Lab 04/18/20 0855 04/20/20 1955  AST 28 45*  ALT 14 21  ALKPHOS 34* 48  BILITOT 0.5 0.8  PROT 4.8* 4.6*  ALBUMIN 2.9* 2.0*  Recent Labs  Lab 04/18/20 0855  AMYLASE 26*   No results for input(s): AMMONIA in the last 168 hours.  ABG    Component Value Date/Time   PHART 7.370 04/19/2020 0829   PCO2ART 31.2 (L) 04/19/2020 0829   PO2ART 69 (L) 04/19/2020 0829   HCO3 18.1 (L) 04/19/2020 0829   TCO2 19 (L) 04/19/2020 0829   ACIDBASEDEF 7.0 (H) 04/19/2020 0829   O2SAT 94.0 04/19/2020 0829     Coagulation Profile: Recent Labs  Lab 04/17/20 1357  INR 1.2    Cardiac Enzymes: No results for input(s): CKTOTAL, CKMB, CKMBINDEX, TROPONINI in the last 168 hours.  HbA1C: No results found for: HGBA1C  CBG: No results for input(s): GLUCAP in the last 168 hours.   This patient is critically ill with multiple organ system failure; which, requires frequent high complexity decision making, assessment, support, evaluation, and titration of therapies. This was completed through the application of advanced monitoring technologies and extensive interpretation of multiple databases. During this encounter critical care time was  devoted to patient care services described in this note for 33 minutes.  Garner Nash, DO [facility] 04/21/2020 9:14 AM

## 2020-04-22 DIAGNOSIS — I714 Abdominal aortic aneurysm, without rupture: Secondary | ICD-10-CM | POA: Diagnosis not present

## 2020-04-22 DIAGNOSIS — R410 Disorientation, unspecified: Secondary | ICD-10-CM | POA: Diagnosis not present

## 2020-04-22 DIAGNOSIS — N179 Acute kidney failure, unspecified: Secondary | ICD-10-CM | POA: Diagnosis not present

## 2020-04-22 DIAGNOSIS — I959 Hypotension, unspecified: Secondary | ICD-10-CM | POA: Diagnosis not present

## 2020-04-22 DIAGNOSIS — I4819 Other persistent atrial fibrillation: Secondary | ICD-10-CM | POA: Diagnosis not present

## 2020-04-22 LAB — BASIC METABOLIC PANEL
Anion gap: 11 (ref 5–15)
BUN: 53 mg/dL — ABNORMAL HIGH (ref 8–23)
CO2: 18 mmol/L — ABNORMAL LOW (ref 22–32)
Calcium: 8.3 mg/dL — ABNORMAL LOW (ref 8.9–10.3)
Chloride: 124 mmol/L — ABNORMAL HIGH (ref 98–111)
Creatinine, Ser: 2.1 mg/dL — ABNORMAL HIGH (ref 0.61–1.24)
GFR, Estimated: 31 mL/min — ABNORMAL LOW (ref >60)
Glucose, Bld: 109 mg/dL — ABNORMAL HIGH (ref 70–99)
Potassium: 3.6 mmol/L (ref 3.5–5.1)
Sodium: 153 mmol/L — ABNORMAL HIGH (ref 135–145)

## 2020-04-22 LAB — GLUCOSE, CAPILLARY
Glucose-Capillary: 101 mg/dL — ABNORMAL HIGH (ref 70–99)
Glucose-Capillary: 104 mg/dL — ABNORMAL HIGH (ref 70–99)
Glucose-Capillary: 110 mg/dL — ABNORMAL HIGH (ref 70–99)
Glucose-Capillary: 112 mg/dL — ABNORMAL HIGH (ref 70–99)
Glucose-Capillary: 93 mg/dL (ref 70–99)
Glucose-Capillary: 98 mg/dL (ref 70–99)

## 2020-04-22 LAB — CBC WITH DIFFERENTIAL/PLATELET
Abs Immature Granulocytes: 0.15 10*3/uL — ABNORMAL HIGH (ref 0.00–0.07)
Basophils Absolute: 0 10*3/uL (ref 0.0–0.1)
Basophils Relative: 0 %
Eosinophils Absolute: 0 10*3/uL (ref 0.0–0.5)
Eosinophils Relative: 0 %
HCT: 28.7 % — ABNORMAL LOW (ref 39.0–52.0)
Hemoglobin: 9.3 g/dL — ABNORMAL LOW (ref 13.0–17.0)
Immature Granulocytes: 1 %
Lymphocytes Relative: 3 %
Lymphs Abs: 0.5 10*3/uL — ABNORMAL LOW (ref 0.7–4.0)
MCH: 31.6 pg (ref 26.0–34.0)
MCHC: 32.4 g/dL (ref 30.0–36.0)
MCV: 97.6 fL (ref 80.0–100.0)
Monocytes Absolute: 1.1 10*3/uL — ABNORMAL HIGH (ref 0.1–1.0)
Monocytes Relative: 6 %
Neutro Abs: 15.9 10*3/uL — ABNORMAL HIGH (ref 1.7–7.7)
Neutrophils Relative %: 90 %
Platelets: 145 10*3/uL — ABNORMAL LOW (ref 150–400)
RBC: 2.94 MIL/uL — ABNORMAL LOW (ref 4.22–5.81)
RDW: 16.3 % — ABNORMAL HIGH (ref 11.5–15.5)
WBC: 17.7 10*3/uL — ABNORMAL HIGH (ref 4.0–10.5)
nRBC: 0.1 % (ref 0.0–0.2)

## 2020-04-22 LAB — URINALYSIS, ROUTINE W REFLEX MICROSCOPIC
Bacteria, UA: NONE SEEN
Bilirubin Urine: NEGATIVE
Glucose, UA: NEGATIVE mg/dL
Ketones, ur: NEGATIVE mg/dL
Leukocytes,Ua: NEGATIVE
Nitrite: NEGATIVE
Protein, ur: 30 mg/dL — AB
Specific Gravity, Urine: 1.019 (ref 1.005–1.030)
pH: 5 (ref 5.0–8.0)

## 2020-04-22 LAB — MAGNESIUM: Magnesium: 2.2 mg/dL (ref 1.7–2.4)

## 2020-04-22 LAB — PHOSPHORUS: Phosphorus: 4 mg/dL (ref 2.5–4.6)

## 2020-04-22 MED ORDER — DEXTROSE-NACL 5-0.45 % IV SOLN: INTRAVENOUS | Status: DC

## 2020-04-22 MED ORDER — SODIUM BICARBONATE 650 MG PO TABS
1300.0000 mg | ORAL_TABLET | Freq: Two times a day (BID) | ORAL | Status: DC
  Administered 2020-04-23 (×2): 1300 mg via ORAL
  Filled 2020-04-22 (×3): qty 2

## 2020-04-22 MED ORDER — RISPERIDONE 0.5 MG PO TBDP
0.2500 mg | ORAL_TABLET | Freq: Two times a day (BID) | ORAL | Status: DC
  Administered 2020-04-22 – 2020-04-30 (×13): 0.25 mg via SUBLINGUAL
  Filled 2020-04-22 (×20): qty 0.5

## 2020-04-22 MED ORDER — HEPARIN (PORCINE) 25000 UT/250ML-% IV SOLN
2250.0000 [IU]/h | INTRAVENOUS | Status: AC
  Administered 2020-04-22: 1300 [IU]/h via INTRAVENOUS
  Administered 2020-04-23: 1350 [IU]/h via INTRAVENOUS
  Administered 2020-04-24 – 2020-04-25 (×3): 1950 [IU]/h via INTRAVENOUS
  Administered 2020-04-26 (×3): 2050 [IU]/h via INTRAVENOUS
  Administered 2020-04-27: 2000 [IU]/h via INTRAVENOUS
  Filled 2020-04-22 (×9): qty 250

## 2020-04-22 MED ORDER — DEXTROSE 5 % IV SOLN: INTRAVENOUS | Status: DC

## 2020-04-22 MED ORDER — CHLORHEXIDINE GLUCONATE 0.12 % MT SOLN
15.0000 mL | Freq: Two times a day (BID) | OROMUCOSAL | Status: DC
  Administered 2020-04-22 – 2020-05-05 (×19): 15 mL via OROMUCOSAL
  Filled 2020-04-22 (×16): qty 15

## 2020-04-22 MED ORDER — ORAL CARE MOUTH RINSE
15.0000 mL | Freq: Two times a day (BID) | OROMUCOSAL | Status: DC
  Administered 2020-04-22 – 2020-05-04 (×15): 15 mL via OROMUCOSAL

## 2020-04-22 MED ORDER — ACETAMINOPHEN 10 MG/ML IV SOLN
1000.0000 mg | Freq: Four times a day (QID) | INTRAVENOUS | Status: AC
  Administered 2020-04-22: 1000 mg via INTRAVENOUS
  Filled 2020-04-22: qty 100

## 2020-04-22 NOTE — Progress Notes (Signed)
Progress Note   Subjective   Sleeping.  Family at bedside.  No new concerns.    Inpatient Medications    Scheduled Meds: . atorvastatin  10 mg Per Tube QHS  . Chlorhexidine Gluconate Cloth  6 each Topical Daily  . fluticasone furoate-vilanterol  1 puff Inhalation Daily  . heparin  5,000 Units Subcutaneous Q8H  . lidocaine  2 patch Transdermal Q24H  . mouth rinse  15 mL Mouth Rinse BID  . metoprolol tartrate  5 mg Intravenous 6 X Daily  . pantoprazole (PROTONIX) IV  40 mg Intravenous Q12H  . risperiDONE  0.5 mg Sublingual BID   Continuous Infusions: . sodium chloride Stopped (04/17/20 1643)  . amiodarone 30 mg/hr (04/22/20 0435)  . dexmedetomidine (PRECEDEX) IV infusion 1.2 mcg/kg/hr (04/22/20 5366)  . dextrose 5 % and 0.9% NaCl Stopped (04/20/20 1833)  . piperacillin-tazobactam (ZOSYN)  IV 3.375 g (04/22/20 0624)   PRN Meds: acetaminophen **OR** acetaminophen, alum & mag hydroxide-simeth, bisacodyl, guaiFENesin-dextromethorphan, hydrALAZINE, HYDROmorphone (DILAUDID) injection, ondansetron, phenol, sodium phosphate   Vital Signs    Vitals:   04/22/20 0500 04/22/20 0600 04/22/20 0700 04/22/20 0721  BP: 96/64 114/81 (!) 112/52   Pulse: (!) 124 (!) 137 (!) 127   Resp: (!) 31 (!) 35 (!) 24   Temp:    100.1 F (37.8 C)  TempSrc:      SpO2: 100% 99% 100%   Weight:      Height:        Intake/Output Summary (Last 24 hours) at 04/22/2020 4403 Last data filed at 04/22/2020 4742 Gross per 24 hour  Intake 1065.93 ml  Output 1620 ml  Net -554.07 ml   Filed Weights   04/17/20 0603  Weight: 95.3 kg    Telemetry    Afib, V rates frequently elevated,  Mostly 100-100 bpm - Personally Reviewed  Physical Exam   GEN- The patient is ill appearing, sleeping but rouses Head- normocephalic, atraumatic Eyes-  Sclera clear, conjunctiva pink Ears- hearing intact Oropharynx- clear Neck- supple, Lungs-  normal work of breathing Heart- irregular rate and rhythm  GI-  slightly firm,  Hypoactive BS,  Not tender Extremities- no clubbing, cyanosis, or edema     Labs    Chemistry Recent Labs  Lab 04/18/20 0855 04/18/20 1702 04/20/20 1955 04/20/20 1955 04/21/20 0236 04/21/20 0236 04/21/20 0850 04/21/20 1620 04/21/20 1655  NA 142   < > 146*   < > 147*   < > 146* 151* 151*  K 5.1   < > 3.9   < > 3.8   < > 3.8 3.6 3.5  CL 115*   < > 118*   < > 118*  --  119* 122*  --   CO2 16*   < > 17*   < > 17*  --  18* 17*  --   GLUCOSE 155*   < > 123*   < > 111*  --  124* 110*  --   BUN 29*   < > 49*   < > 52*  --  53* 55*  --   CREATININE 2.42*   < > 2.42*   < > 2.44*  --  2.33* 2.31*  --   CALCIUM 8.1*   < > 8.2*   < > 8.1*  --  8.1* 8.4*  --   PROT 4.8*  --  4.6*  --   --   --  4.7*  --   --   ALBUMIN 2.9*  --  2.0*  --   --   --  1.8*  --   --   AST 28  --  45*  --   --   --  40  --   --   ALT 14  --  21  --   --   --  18  --   --   ALKPHOS 34*  --  48  --   --   --  49  --   --   BILITOT 0.5  --  0.8  --   --   --  0.7  --   --   GFRNONAA 24*   < > 26*   < > 26*  --  28* 28*  --   ANIONGAP 11   < > 11   < > 12  --  9 12  --    < > = values in this interval not displayed.     Hematology Recent Labs  Lab 04/21/20 0236 04/21/20 0236 04/21/20 0850 04/21/20 1620 04/21/20 1655  WBC 14.2*  --  16.1* 18.0*  --   RBC 2.75*  --  2.82* 3.02*  --   HGB 9.0*   < > 9.3* 9.9* 9.2*  HCT 27.3*   < > 27.7* 29.7* 27.0*  MCV 99.3  --  98.2 98.3  --   MCH 32.7  --  33.0 32.8  --   MCHC 33.0  --  33.6 33.3  --   RDW 16.7*  --  16.5* 16.8*  --   PLT 114*  --  114* 127*  --    < > = values in this interval not displayed.     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  Assessment & Plan    1.   afib Rates are elevated at times Secondary to acute medical illness Continue IV amiodarone We should consider heparin drip if platelets/ hematocrit remain stable.  I will defer timing to vascular surgery team. BP is intermittently too soft for additional AV nodal agents.  2. Acute renal failure Low urine output  Nephrology notes reviewed ACEi on hold todays labs are still pending  3. HTN Stable No change required today  4. Delirium Stable No change required today  5. Thrombocytopenia/ anemia todays labs are pending Transfuse for hb <7 g/dl Holding off on anticoagulation at this time for afib  Critical care time was exclusive of separate billable procedures and treating other patients.  Critial care time was spent personally by me (independant of midlevel providers) on the following activities: development of treatment plan with patient, wife and daughter as well as nursing, discussions with Dr Trula Slade, evaluation of patients response to treatment, examining patient, obtaining history from wife and daughter at bedside,   reviewing treatments/ interventions, lab studies, radiographic studies, pulse ox, and re-evaluation of patients condition.     The patient is critically ill with multiple organ systems failure and requires high complexity decision making for assessment and support, frequent evaluation and titration of therapies, application of advanced monitoring technologies and extensive interpretation of databases.   Critical care was necessary to treat or prevent immintent or life-threatening deterioration.    Total CCT spent directly with the patient  today is 35 minutes  Thompson Grayer MD, [facility] 04/22/2020 9:03 AM

## 2020-04-22 NOTE — Plan of Care (Signed)
  Problem: Cardiac: Goal: Ability to maintain an adequate cardiac output will improve Outcome: Progressing   Problem: Pain Managment: Goal: General experience of comfort will improve Outcome: Progressing   Problem: Elimination: Goal: Will not experience complications related to bowel motility Outcome: Not Progressing   Problem: Nutrition: Goal: Adequate nutrition will be maintained Outcome: Not Progressing   Problem: Clinical Measurements: Goal: Ability to maintain clinical measurements within normal limits will improve Outcome: Progressing Goal: Respiratory complications will improve Outcome: Not Progressing   Problem: Education: Goal: Knowledge of General Education information will improve Description: Including pain rating scale, medication(s)/side effects and non-pharmacologic comfort measures Outcome: Progressing

## 2020-04-22 NOTE — Progress Notes (Signed)
ANTICOAGULATION CONSULT NOTE - Initial Consult  Pharmacy Consult for heparin Indication: atrial fibrillation  Allergies  Allergen Reactions  . Levaquin [Levofloxacin]     Aortic Aneurysm  . Sulfonamide Derivatives Itching  . Sulfasalazine Itching    Patient Measurements: Height: 6\' 3"  (190.5 cm) Weight: 95.3 kg (210 lb) IBW/kg (Calculated) : 84.5 Heparin Dosing Weight: 95.3 kg   Vital Signs: Temp: 99.7 F (37.6 C) (10/24 1507) Temp Source: Axillary (10/24 1507) BP: 108/30 (10/24 1400) Pulse Rate: 106 (10/24 1400)  Labs: Recent Labs    04/21/20 0850 04/21/20 0850 04/21/20 1620 04/21/20 1620 04/21/20 1655 04/22/20 1023  HGB 9.3*   < > 9.9*   < > 9.2* 9.3*  HCT 27.7*   < > 29.7*  --  27.0* 28.7*  PLT 114*  --  127*  --   --  145*  CREATININE 2.33*  --  2.31*  --   --  2.10*   < > = values in this interval not displayed.    Estimated Creatinine Clearance: 33.5 mL/min (A) (by C-G formula based on SCr of 2.1 mg/dL (H)).   Medical History: Past Medical History:  Diagnosis Date  . AAA (abdominal aortic aneurysm) ([place])   . Atherosclerosis of aortic arch ([place]) 09/25/2015   By CT scan   . CAD (coronary artery disease) 09/25/2015   Mild 3v by CT scan   . Cancer [facility])    prostate  . Colon polyps   . COPD (chronic obstructive pulmonary disease) ([place])   . Diverticulosis of colon   . Femoral bruit    bilateral, found by Dr. Jefm Bryant  . Heart murmur    child  . History of prostate cancer   . IBS (irritable bowel syndrome)    improved after retirement  . 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 Pelvic fracture ([place])   . Pneumonia    child  . Pulmonary nodules 09/25/2015   On screening CT scan, rpt 1 yr   . Shortness of breath    doe    Medications:  Scheduled:  . atorvastatin  10 mg Per Tube QHS  . chlorhexidine  15 mL Mouth Rinse BID  . Chlorhexidine Gluconate Cloth  6 each Topical Daily  . fluticasone  furoate-vilanterol  1 puff Inhalation Daily  . lidocaine  2 patch Transdermal Q24H  . mouth rinse  15 mL Mouth Rinse BID  . mouth rinse  15 mL Mouth Rinse q12n4p  . metoprolol tartrate  5 mg Intravenous 6 X Daily  . pantoprazole (PROTONIX) IV  40 mg Intravenous Q12H  . risperiDONE  0.25 mg Sublingual BID  . sodium bicarbonate  1,300 mg Oral BID    Assessment: 30 yom who had elective endovascular repair of abdominal aortic aneurysm on 10/18. Has been having AKI and mild hypoxia in addition to Afib. No AC PTA. Okay to start [facility] per teams without bolus.   Hgb 9.3, plt 145 (stable/improving). Last received subQ heparin at 1350 on 10/24. No s/s xof bleeding.   Goal of Therapy:  Heparin level 0.3-0.7 units/ml Monitor platelets by anticoagulation protocol: Yes   Plan:  No bolus Start heparin infusion at 1300 units/hr Check anti-Xa level in 8 hours and daily while on heparin Continue to monitor H&H and platelets  Antonietta Jewel, PharmD, [place] Pharmacist  Phone: [phone] 04/22/2020 3:44 PM  Please check AMION for all [place] phone numbers After 10:00 PM, call [place] [phone]

## 2020-04-22 NOTE — Progress Notes (Signed)
eLink Physician-Brief Progress Note Patient Name: Carlos Olson DOB: 06-Aug-1940 MRN: [id number]   Date of Service  04/22/2020  HPI/Events of Note  RN reporting patient is back in afib, but still on Amio gtt. She wants to know if you want to order anything. Not in RVR  eICU Interventions  - follow BMP/Mag/po4/ion calcium levels.      Intervention Category Intermediate Interventions: Arrhythmia - evaluation and management  Elmer Sow 04/22/2020, 5:55 AM

## 2020-04-22 NOTE — Progress Notes (Signed)
[facility] Progress Note    Assessment/ Plan:   AKI: Likely secondary to acute tubular injury in the context of hypotension, worsening anemia -Baseline creatinine seems to be around 1 -Peak creatinine 3.1 on 10/21, improved to 2.3 yesterday -Urine output improved with Lasix challenge, use Lasix as needed.  Overall a good sign. Otherwise, clinically looks more hypovolemic exam, can utilize fluids if needed (see below). If urine output drops off again can re-trial lasix 80mg  iv x 1 dose -today's labs pending -Renal ultrasound without obstruction -No indication for renal replacement therapy currently -Repeat UA with sediment exam -Continue to monitor daily Cr, Dose meds for GFR<15 -Monitor Daily I/Os, Daily weight  -Maintain MAP>65 for optimal renal perfusion.  -Agree with holding ACE-I, avoid further nephrotoxins including NSAIDS, Morphine.  Unless absolutely necessary, avoid CT with contrast and/or MRI with gadolinium.   Hypernatremia -free water deficit around 1.7L yesterday and more on the hypovolemic side, consider hypotonic fluids: 1/2ns along with d5 since his intake is poor  Metabolic acidosis -related to aki, lactate 1.0. if persistently low/no change then add nahco3 1300mg  BID to start with  H/o HTN: intermittently hypotensive here, on rate ctrl for afib.   Anemia -Transfuse for Hgb<7 g/dL  A. fib.  Currently on amio drip, anticoagulation on hold  Encephalopathy: ICU delirium & benzos, management per primary service. 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 Sleeping comfortably. Discussed with wife and daughter at the bedside. uop ~1.6L. labs to be drawn Ct done yesterday, no discernable hematoma, RLL consolidation   Objective:   BP 113/73   Pulse (!) 116   Temp 100.1 F (37.8 C)    Resp (!) 33   Ht 6\' 3"  (1.905 m)   Wt 95.3 kg   SpO2 100%   BMI 26.25 kg/m   Intake/Output Summary (Last 24 hours) at 04/22/2020 1029 Last data filed at 04/22/2020 0900 Gross per 24 hour  Intake 1008.21 ml  Output 1790 ml  Net -781.79 ml   Weight change:   Physical Exam: Gen:ill appearing, sleeping in bed HEENT: dry mucosal membranes HMC:NOBSJ irreg Resp: diminished air entry right base, normal wob, bl chest expansion GGE:ZMOQ Ext:no significant edema Neuro: confused  Imaging: CT ABDOMEN PELVIS WO CONTRAST  Result Date: 04/21/2020 CLINICAL DATA:  History of abdominal aortic aneurysm with recent aortobifemoral bypass graft. EXAM: CT ABDOMEN AND PELVIS WITHOUT CONTRAST TECHNIQUE: Multidetector CT imaging of the abdomen and pelvis was performed following the standard protocol without IV contrast. COMPARISON:  03/15/2020 FINDINGS: Lower chest: Small left pleural effusion is noted. Near complete consolidation of the right lower lobe is noted. Mild left basilar atelectasis is seen. Hepatobiliary: No focal liver abnormality is seen. No gallstones, gallbladder wall thickening, or biliary dilatation. Pancreas: Unremarkable. No pancreatic ductal dilatation or surrounding inflammatory changes. Spleen: Normal in size without focal abnormality. Adrenals/Urinary Tract: Adrenal glands are within normal limits. Kidneys demonstrate no renal calculi or obstructive changes. The bladder is decompressed. Some air is noted in the bladder likely related to prior Foley catheter placement. Stomach/Bowel: Fecal material is noted throughout the colon. Some prominent fecal material in the rectum is noted which may represent some early impaction. Diverticular change is seen without specific diverticulitis. Gastric catheter is noted within the stomach. The appendix is not well visualized. Small bowel and stomach are otherwise within normal limits.  Vascular/Lymphatic: Atherosclerotic calcifications of the abdominal  aorta are noted. There are changes consistent with aortobifemoral bypass graft consistent with the patient's given clinical history. Previously seen aneurysm sac is not identified. Some surrounding soft tissue density is noted in the operative site likely related to the recent surgery. No sizable hematoma is noted. Reproductive: Status post prostatectomy. Other: No abdominal wall hernia or abnormality. No abdominopelvic ascites. Musculoskeletal: Degenerative changes of lumbar spine are noted. IMPRESSION: Status post abdominal aortic repair with aortobifemoral bypass graft. Some stranding is noted in the operative site likely related to postoperative change. No sizable hematoma is seen. Diverticulosis without diverticulitis. Near complete consolidation of the right lower lobe. Small left pleural effusion is seen with left basilar atelectasis. Electronically Signed   By: Inez Catalina M.D.   On: 04/21/2020 18:47    Labs: BMET Recent Labs  Lab 04/19/20 1654 04/19/20 1654 04/20/20 0229 04/20/20 1515 04/20/20 1955 04/21/20 0236 04/21/20 0850 04/21/20 1620 04/21/20 1655  NA 143   < > 145 147* 146* 147* 146* 151* 151*  K 5.0   < > 4.7 4.2 3.9 3.8 3.8 3.6 3.5  CL 119*  --  120* 120* 118* 118* 119* 122*  --   CO2 17*  --  16* 17* 17* 17* 18* 17*  --   GLUCOSE 138*  --  137* 120* 123* 111* 124* 110*  --   BUN 49*  --  50* 47* 49* 52* 53* 55*  --   CREATININE 2.95*  --  2.71* 2.45* 2.42* 2.44* 2.33* 2.31*  --   CALCIUM 7.8*  --  7.9* 8.4* 8.2* 8.1* 8.1* 8.4*  --    < > = values in this interval not displayed.   CBC Recent Labs  Lab 04/20/20 1955 04/20/20 1955 04/21/20 0236 04/21/20 0850 04/21/20 1620 04/21/20 1655  WBC 14.1*  --  14.2* 16.1* 18.0*  --   HGB 9.1*   < > 9.0* 9.3* 9.9* 9.2*  HCT 27.8*   < > 27.3* 27.7* 29.7* 27.0*  MCV 98.6  --  99.3 98.2 98.3  --   PLT 113*  --  114* 114* 127*  --    < > = values in this interval not displayed.    Medications:    . atorvastatin  10 mg  Per Tube QHS  . Chlorhexidine Gluconate Cloth  6 each Topical Daily  . fluticasone furoate-vilanterol  1 puff Inhalation Daily  . heparin  5,000 Units Subcutaneous Q8H  . lidocaine  2 patch Transdermal Q24H  . mouth rinse  15 mL Mouth Rinse BID  . metoprolol tartrate  5 mg Intravenous 6 X Daily  . pantoprazole (PROTONIX) IV  40 mg Intravenous Q12H  . risperiDONE  0.5 mg Sublingual BID      Gean Quint, MD [facility] 04/22/2020, 10:29 AM

## 2020-04-22 NOTE — Progress Notes (Addendum)
NAME:  Carlos Olson, MRN:  [id number], DOB:  12-Jan-1941, LOS: 5 ADMISSION DATE:  04/17/2020, CONSULTATION DATE: 10/21 REFERRING MD: Dr. Donzetta Matters, CHIEF COMPLAINT: AKI with mild hypoxia  Brief History   79 year old male who presented 10/18 for elective endovascular repair abdominal aortic aneurysm with Dr. Donzetta Matters. PCCM consulted 10/21 for assistance in medical management given AKI and mild hypoxia  History of present illness   Carlos Olson is a 79 year old male with a past medical history significant for AAA, history of prostate cancer, heart murmur, diverticulitis, COPD, CAD, and atherosclerosis who presented for elective endovascular repair of abdominal aortic aneurysm with Dr. Donzetta Matters. Overnight postop day 0 patient required low-dose pressor support for mild hypotension, vascular surgery consulted cardiology initially for assistance of hypotension.  Morning of 10/21 patient was seen with worsening creatinine with rise from 1.19-3.10 10/21 and decreasing urine output with approximately 245 mL of urine out last 24 hours. Additionally he was seen with worsening confusion after receiving opioid administration. Pressor support discontinued evening of 12/20. Given worsened AKI and mild hypoxia PCCM consulted for further medical management.  Past Medical History  AAA History of prostate cancer Heart murmur Diverticulitis COPD CAD atherosclerosis  Significant Hospital Events   Admitted 10/18 for elective AAA repair  Consults:  Cardiology PCCM  Procedures:  Endovascular AAA repair 10/19  Significant Diagnostic Tests:    Micro Data:    Antimicrobials:    Interim history/subjective:   Patient remains confused.  On Precedex.  CT abdomen reviewed with vascular surgery.  Low-grade fever.  Objective   Blood pressure (!) 112/52, pulse (!) 127, temperature 100.1 F (37.8 C), resp. rate (!) 24, height 6\' 3"  (1.905 m), weight 95.3 kg, SpO2 100 %.        Intake/Output Summary (Last 24  hours) at 04/22/2020 0816 Last data filed at 04/22/2020 0623 Gross per 24 hour  Intake 1065.93 ml  Output 1620 ml  Net -554.07 ml   Filed Weights   04/17/20 0603  Weight: 95.3 kg    Examination: General: Ill-appearing elderly male resting in bed HEENT: Tracking appropriately Neuro: Alert to voice follows basic commands CV: Irregularly irregular, S1-S2 PULM: Bilateral breath sounds, no crackles GI: Soft nontender nondistended, midline incision CDI, right groin incision CDI Extremities: No significant edema Skin: Bruising around incision sites  Resolved Hospital Problem list     Assessment & Plan:   Acute Kidney Injury with oliguria -Lab work on admission 10/19 revealed creatinine of 1.13 and GFR 58, morning of PCCM consultation creatinine is now 3.10 with GFR 18 -Patient is currently 5 L fluid positive Metabolic acidosis P: Kidney function improving. Repeat labs pending Holding additional Lasix. We appreciate nephrology input. Continue to follow urine output and BMP.  Sepsis, RLL pneumonia, HCAP  MRSA PCR Neg  Leukocytosis  Plan: Continue zosyn   Acute metabolic encephalopathy -Likely multifactorial including ICU delirium and benzodiazepine utilization in elderly patient -Likely component of ICU delirium - secondary to sepsis  P: Avoid benzos Try to come off of Precedex today. Continue low-dose Risperdal, dose reduced to 0.25 mg twice daily  History of COPD not currently in acute exasperation Former smoker History of pulmonary nodules -Primary pulmonologist is Dr. Lamonte Sakai, last seen in pulmonary clinic 11/25/2019 P: Continue scheduled bronchodilators and as needed  Intermittent tachycardia with possible underlying atrial fibrillation -Patient seen with frequent ectopy and tachycardia overnight of 10/21, rhythm improved after receiving labetalol -Developed A. 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  Best practice:  Diet: NPO Pain/Anxiety/Delirium protocol (if indicated): PRN VAP protocol (if indicated): N/A DVT prophylaxis: Heparin GI prophylaxis: PPI Glucose control: Monitor  Mobility: Bedrest Code Status: Full Family Communication: Up with assistance  Disposition: ICU  Labs   CBC: Recent Labs  Lab 04/20/20 1515 04/20/20 1515 04/20/20 1955 04/21/20 0236 04/21/20 0850 04/21/20 1620 04/21/20 1655  WBC 15.5*  --  14.1* 14.2* 16.1* 18.0*  --   HGB 9.8*   < > 9.1* 9.0* 9.3* 9.9* 9.2*  HCT 29.8*   < > 27.8* 27.3* 27.7* 29.7* 27.0*  MCV 99.0  --  98.6 99.3 98.2 98.3  --   PLT 122*  --  113* 114* 114* 127*  --    < > = values in this interval not displayed.    Basic Metabolic Panel: Recent Labs  Lab 04/17/20 1357 04/17/20 1630 04/18/20 0855 04/18/20 1702 04/19/20 0250 04/19/20 0829 04/20/20 0229 04/20/20 0229 04/20/20 1515 04/20/20 1515 04/20/20 1955 04/21/20 0236 04/21/20 0850 04/21/20 1620 04/21/20 1655 04/21/20 1914  NA 141   < > 142   < > 143   < > 145   < > 147*   < > 146* 147* 146* 151* 151*  --   K 4.5   < > 5.1   < > 5.1   < > 4.7   < > 4.2   < > 3.9 3.8 3.8 3.6 3.5  --   CL 111   < > 115*   < > 118*   < > 120*   < > 120*  --  118* 118* 119* 122*  --   --   CO2 20*   < >  16*   < > 16*   < > 16*   < > 17*  --  17* 17* 18* 17*  --   --   GLUCOSE 202*   < > 155*   < > 144*   < > 137*   < > 120*  --  123* 111* 124* 110*  --   --   BUN 18   < > 29*   < > 41*   < > 50*   < > 47*  --  49* 52* 53* 55*  --   --   CREATININE 1.19   < > 2.42*   < > 3.10*   < > 2.71*   < > 2.45*  --  2.42* 2.44* 2.33* 2.31*  --   --   CALCIUM 8.7*   < > 8.1*   < > 8.0*   < > 7.9*   < > 8.4*  --  8.2* 8.1* 8.1* 8.4*  --   --   MG 1.7  --  1.5*  --  2.5*  --  2.5*  --   --   --   --   --   --   --   --  2.4   < > = values in this interval not displayed.   GFR: Estimated Creatinine Clearance: 30.5 mL/min (A) (by C-G formula based on SCr of 2.31 mg/dL (H)). Recent Labs  Lab 04/20/20 1159 04/20/20 1515 04/20/20 1949 04/20/20 1955 04/21/20 0236 04/21/20 0850 04/21/20 1620  WBC  --    < >  --  14.1* 14.2* 16.1* 18.0*  LATICACIDVEN 1.1  --  1.0  --   --  1.0  --    < > = values in this interval not displayed.    Liver Function Tests: Recent Labs  Lab 04/18/20 0855 04/20/20 1955 04/21/20 0850  AST 28 45* 40  ALT 14 21 18   ALKPHOS 34* 48 49  BILITOT 0.5 0.8 0.7  PROT 4.8* 4.6* 4.7*  ALBUMIN 2.9* 2.0* 1.8*   Recent Labs  Lab 04/18/20 0855 04/21/20 1620  LIPASE  --  36  AMYLASE 26*  --    Recent Labs  Lab 04/21/20 1620  AMMONIA 38*    ABG    Component Value Date/Time   PHART 7.435 04/21/2020 1655   PCO2ART 26.8 (L) 04/21/2020 1655   PO2ART 47 (L) 04/21/2020 1655   HCO3 17.9 (L) 04/21/2020 1655   TCO2 19 (L) 04/21/2020 1655   ACIDBASEDEF 5.0 (H) 04/21/2020 1655   O2SAT 83.0 04/21/2020 1655     Coagulation Profile: Recent Labs  Lab 04/17/20 1357  INR 1.2    Cardiac Enzymes: No results for input(s): CKTOTAL, CKMB, CKMBINDEX, TROPONINI in the last 168 hours.  HbA1C: No results found for: HGBA1C  CBG: Recent Labs  Lab 04/21/20 1508 04/21/20 1924 04/21/20 2339 04/22/20 0342 04/22/20 0719  GLUCAP 105* 99 99 110* 98    This patient is critically  ill with multiple organ system failure; which, requires frequent high complexity decision making, assessment, support, evaluation, and titration of therapies. This was completed through the application of advanced monitoring technologies and extensive interpretation of multiple databases. During this encounter critical care time was devoted to patient care services described in this note for 33 minutes.  Garner Nash, DO [facility] 04/22/2020 8:16 AM

## 2020-04-22 NOTE — Progress Notes (Signed)
Subjective  - POD #5  Appears more comfortable today.  Remains groggy but will verbalize answers to questions. He did pass gas and have a bowel movement which was nonbloody  Physical Exam:  Brisk pedal Doppler signals Abdomen remains soft and appears to be less tender today.       Assessment/Plan:  POD #5  Renal: Labs are pending for today.  Urine output appears to be adequate.  Appreciate nephrology assistance. Cardiac: Remains an A. fib.  Rate is a little better controlled.  Appreciate cardiology assistance.  I will plan on starting heparin today, pending his morning CBC. GI: He will stay n.p.o.  We will continue to monitor his NG tube output.  Hopefully this can be removed in the near future.  He did have a nonbloody bowel movement and passed gas yesterday.  Also his CT scan was unremarkable.  Concerns over colonic ischemia remain however this is appearing less and less likely. ID: The patient is on Zosyn for possible intestinal ischemia.  He was febrile last night and has an elevated white count.  There was concern for potential pulmonary process which Zosyn should cover. Pain: We will try to decrease his Precedex today.  I am starting him on IV Tylenol.  Family present and updated at bedside.  Carlos Olson 04/22/2020 10:53 AM --  Vitals:   04/22/20 1000 04/22/20 1030  BP: 113/62 110/62  Pulse:    Resp: (!) 21 (!) 30  Temp:    SpO2:      Intake/Output Summary (Last 24 hours) at 04/22/2020 1053 Last data filed at 04/22/2020 0900 Gross per 24 hour  Intake 1008.21 ml  Output 1790 ml  Net -781.79 ml     Laboratory CBC    Component Value Date/Time   WBC 18.0 (H) 04/21/2020 1620   HGB 9.2 (L) 04/21/2020 1655   HGB 12.1 (L) 08/20/2012 0610   HCT 27.0 (L) 04/21/2020 1655   HCT 44.0 08/02/2012 0853   PLT 127 (L) 04/21/2020 1620   PLT 158 08/20/2012 0610    BMET    Component Value Date/Time   NA 151 (H) 04/21/2020 1655   NA 136 08/20/2012 0610   K  3.5 04/21/2020 1655   K 4.2 08/20/2012 0610   CL 122 (H) 04/21/2020 1620   CL 102 08/20/2012 0610   CO2 17 (L) 04/21/2020 1620   CO2 26 08/20/2012 0610   GLUCOSE 110 (H) 04/21/2020 1620   GLUCOSE 123 (H) 08/20/2012 0610   BUN 55 (H) 04/21/2020 1620   BUN 7 08/20/2012 0610   CREATININE 2.31 (H) 04/21/2020 1620   CREATININE 0.75 08/20/2012 0610   CALCIUM 8.4 (L) 04/21/2020 1620   CALCIUM 8.4 (L) 08/20/2012 0610   GFRNONAA 28 (L) 04/21/2020 1620   GFRNONAA >60 08/20/2012 0610   GFRAA >60 05/07/2017 0949   GFRAA >60 08/20/2012 0610    COAG Lab Results  Component Value Date   INR 1.2 04/17/2020   INR 1.0 04/11/2020   INR 0.97 05/07/2017   No results found for: PTT  Antibiotics Anti-infectives (From admission, onward)   Start     Dose/Rate Route Frequency Ordered Stop   04/20/20 1700  piperacillin-tazobactam (ZOSYN) IVPB 3.375 g        3.375 g 12.5 mL/hr over 240 Minutes Intravenous Every 8 hours 04/20/20 1602     04/17/20 2000  ceFAZolin (ANCEF) IVPB 2g/100 mL premix        2 g 200 mL/hr over 30 Minutes  Intravenous Every 8 hours 04/17/20 1541 04/18/20 0430   04/17/20 0554  ceFAZolin (ANCEF) IVPB 2g/100 mL premix        2 g 200 mL/hr over 30 Minutes Intravenous 30 min pre-op 04/17/20 0554 04/17/20 1211       V. Carlos Olson, M.D., [facility] [place] Office: [phone] Pager:  [phone]

## 2020-04-22 NOTE — Progress Notes (Signed)
[place] Progress Note Patient Name: Carlos Olson DOB: 22-Feb-1941 MRN: [id number]   Date of Service  04/22/2020  HPI/Events of Note  AM labs.  Notes seen. Labs reviewed  eICU Interventions  BMP/CBC ordered     Intervention Category Minor Interventions: Other:  Elmer Sow 04/22/2020, 1:49 AM

## 2020-04-22 NOTE — Progress Notes (Signed)
Met panel reviewed.  Worsening hyponatremia.  Fluids switched to D5 1/2 NS at 75 cc/hr.  Renal function better with a creatinine down to 2.1.  Bicarb persistently low, starting sodium bicarb 1300 mg BID  Gean Quint, MD [facility]

## 2020-04-23 ENCOUNTER — Inpatient Hospital Stay (HOSPITAL_COMMUNITY): Payer: Medicare HMO

## 2020-04-23 DIAGNOSIS — I4891 Unspecified atrial fibrillation: Secondary | ICD-10-CM | POA: Diagnosis not present

## 2020-04-23 DIAGNOSIS — I714 Abdominal aortic aneurysm, without rupture: Secondary | ICD-10-CM | POA: Diagnosis not present

## 2020-04-23 DIAGNOSIS — G9341 Metabolic encephalopathy: Secondary | ICD-10-CM | POA: Diagnosis not present

## 2020-04-23 LAB — CBC
HCT: 27.3 % — ABNORMAL LOW (ref 39.0–52.0)
Hemoglobin: 8.9 g/dL — ABNORMAL LOW (ref 13.0–17.0)
MCH: 32.1 pg (ref 26.0–34.0)
MCHC: 32.6 g/dL (ref 30.0–36.0)
MCV: 98.6 fL (ref 80.0–100.0)
Platelets: 153 10*3/uL (ref 150–400)
RBC: 2.77 MIL/uL — ABNORMAL LOW (ref 4.22–5.81)
RDW: 16.4 % — ABNORMAL HIGH (ref 11.5–15.5)
WBC: 16.2 10*3/uL — ABNORMAL HIGH (ref 4.0–10.5)
nRBC: 0 % (ref 0.0–0.2)

## 2020-04-23 LAB — BASIC METABOLIC PANEL
Anion gap: 13 (ref 5–15)
Anion gap: 8 (ref 5–15)
BUN: 54 mg/dL — ABNORMAL HIGH (ref 8–23)
BUN: 47 mg/dL — ABNORMAL HIGH (ref 8–23)
CO2: 18 mmol/L — ABNORMAL LOW (ref 22–32)
CO2: 20 mmol/L — ABNORMAL LOW (ref 22–32)
Calcium: 8.2 mg/dL — ABNORMAL LOW (ref 8.9–10.3)
Calcium: 8.2 mg/dL — ABNORMAL LOW (ref 8.9–10.3)
Chloride: 125 mmol/L — ABNORMAL HIGH (ref 98–111)
Chloride: 126 mmol/L — ABNORMAL HIGH (ref 98–111)
Creatinine, Ser: 2.06 mg/dL — ABNORMAL HIGH (ref 0.61–1.24)
Creatinine, Ser: 1.96 mg/dL — ABNORMAL HIGH (ref 0.61–1.24)
GFR, Estimated: 32 mL/min — ABNORMAL LOW (ref >60)
GFR, Estimated: 34 mL/min — ABNORMAL LOW (ref >60)
Glucose, Bld: 112 mg/dL — ABNORMAL HIGH (ref 70–99)
Glucose, Bld: 130 mg/dL — ABNORMAL HIGH (ref 70–99)
Potassium: 3.3 mmol/L — ABNORMAL LOW (ref 3.5–5.1)
Potassium: 3.3 mmol/L — ABNORMAL LOW (ref 3.5–5.1)
Sodium: 156 mmol/L — ABNORMAL HIGH (ref 135–145)
Sodium: 154 mmol/L — ABNORMAL HIGH (ref 135–145)

## 2020-04-23 LAB — HEPARIN LEVEL (UNFRACTIONATED)
Heparin Unfractionated: 0.29 IU/mL — ABNORMAL LOW (ref 0.30–0.70)
Heparin Unfractionated: 0.23 IU/mL — ABNORMAL LOW (ref 0.30–0.70)
Heparin Unfractionated: 0.2 IU/mL — ABNORMAL LOW (ref 0.30–0.70)

## 2020-04-23 LAB — GLUCOSE, CAPILLARY
Glucose-Capillary: 110 mg/dL — ABNORMAL HIGH (ref 70–99)
Glucose-Capillary: 121 mg/dL — ABNORMAL HIGH (ref 70–99)
Glucose-Capillary: 140 mg/dL — ABNORMAL HIGH (ref 70–99)
Glucose-Capillary: 123 mg/dL — ABNORMAL HIGH (ref 70–99)

## 2020-04-23 LAB — CYTOLOGY - NON PAP

## 2020-04-23 LAB — CALCIUM, IONIZED: Calcium, Ionized, Serum: 5.1 mg/dL (ref 4.5–5.6)

## 2020-04-23 MED ORDER — METOPROLOL TARTRATE 25 MG/10 ML ORAL SUSPENSION
12.5000 mg | Freq: Two times a day (BID) | ORAL | Status: DC
  Administered 2020-04-23 (×2): 12.5 mg
  Filled 2020-04-23 (×2): qty 5

## 2020-04-23 MED ORDER — METOPROLOL TARTRATE 5 MG/5ML IV SOLN
5.0000 mg | INTRAVENOUS | Status: AC | PRN
  Administered 2020-04-23 – 2020-04-24 (×3): 5 mg via INTRAVENOUS
  Filled 2020-04-23 (×2): qty 5

## 2020-04-23 MED ORDER — AMIODARONE HCL 150 MG/3ML IV SOLN: 150.0000 mg | Freq: Once | INTRAVENOUS | Status: DC

## 2020-04-23 MED ORDER — AMIODARONE LOAD VIA INFUSION
150.0000 mg | Freq: Once | INTRAVENOUS | Status: AC
  Administered 2020-04-23: 150 mg via INTRAVENOUS
  Filled 2020-04-23: qty 83.34

## 2020-04-23 MED ORDER — POTASSIUM CHLORIDE 10 MEQ/100ML IV SOLN
10.0000 meq | INTRAVENOUS | Status: AC
  Administered 2020-04-23 (×2): 10 meq via INTRAVENOUS
  Filled 2020-04-23 (×2): qty 100

## 2020-04-23 MED ORDER — METOPROLOL TARTRATE 5 MG/5ML IV SOLN
INTRAVENOUS | Status: AC
  Filled 2020-04-23: qty 5

## 2020-04-23 MED ORDER — OSMOLITE 1.2 CAL PO LIQD
1000.0000 mL | ORAL | Status: DC
  Administered 2020-04-23: 1000 mL
  Filled 2020-04-23 (×2): qty 1000

## 2020-04-23 MED ORDER — DARBEPOETIN ALFA 100 MCG/0.5ML IJ SOSY
100.0000 ug | PREFILLED_SYRINGE | INTRAMUSCULAR | Status: DC
  Administered 2020-04-23 – 2020-05-21 (×5): 100 ug via SUBCUTANEOUS
  Filled 2020-04-23 (×6): qty 0.5

## 2020-04-23 NOTE — Progress Notes (Signed)
Carlos Olson Progress Note    Assessment/ Plan:   AKI: Likely secondary to acute tubular injury in the context of hypotension, worsening anemia -Baseline creatinine seems to be around 1 -Peak creatinine 3.1 on 10/21, improved to 2.3, now 2.0 -Urine output improved with Lasix challenge, use Lasix as needed. Otherwise, clinically looks more hypovolemic exam, needs more IVF  -No indication for renal replacement therapy currently -Continue to monitor daily Cr, Dose meds for GFR<15 -Monitor Daily I/Os, Daily weight  -Maintain MAP>65 for optimal renal perfusion.  -Agree with holding ACE-I, avoid further nephrotoxins including NSAIDS, Morphine.  Unless absolutely necessary, avoid CT with contrast and/or MRI with gadolinium.   Hypernatremia -worsening- in the setting of ileus cannot give free water-  Will increase d5 from 75 per hour to 150 per hour-  As soon as can do will give free water to his gut  Metabolic acidosis -related to aki, oral bicarb is ordered but I assume not being given due to ileus  H/o HTN: intermittently hypotensive here, on rate ctrl for afib.   Anemia -Transfuse for Hgb<7 g/dL- add ESA  A. fib.  Currently on amio drip, anticoagulation on hold  Encephalopathy: ICU delirium & benzos, management per primary service. 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: mgmt per vasc surg    Subjective:   No acute events-  Still appears to have ileus, uop ~1.6L again-  crt improved some-  Developing worsening hypernatremia - is thirsty     Objective:   BP 112/70   Pulse (!) 105   Temp 99.1 F (37.3 C) (Oral)   Resp (!) 30   Ht 6\' 3"  (1.905 m)   Wt 95.3 kg   SpO2 98%   BMI 26.26 kg/m   Intake/Output Summary (Last 24 hours) at 04/23/2020 0856 Last data filed at 04/23/2020 0700 Gross per 24 hour  Intake 2213.24 ml  Output 1755 ml  Net 458.24 ml   Weight change:    Physical Exam: Gen: more alert, multiple complaints HEENT: dry mucosal membranes CZY:[id number] irreg Resp: diminished air entry right base, normal wob, bl chest expansion [id number] Ext:no significant edema Neuro: confused  Imaging: CT ABDOMEN PELVIS WO CONTRAST  Result Date: 04/21/2020 CLINICAL DATA:  History of abdominal aortic aneurysm with recent aortobifemoral bypass graft. EXAM: CT ABDOMEN AND PELVIS WITHOUT CONTRAST TECHNIQUE: Multidetector CT imaging of the abdomen and pelvis was performed following the standard protocol without IV contrast. COMPARISON:  03/15/2020 FINDINGS: Lower chest: Small left pleural effusion is noted. Near complete consolidation of the right lower lobe is noted. Mild left basilar atelectasis is seen. Hepatobiliary: No focal liver abnormality is seen. No gallstones, gallbladder wall thickening, or biliary dilatation. Pancreas: Unremarkable. No pancreatic ductal dilatation or surrounding inflammatory changes. Spleen: Normal in size without focal abnormality. Adrenals/Urinary Tract: Adrenal glands are within normal limits. Kidneys demonstrate no renal calculi or obstructive changes. The bladder is decompressed. Some air is noted in the bladder likely related to prior Foley catheter placement. Stomach/Bowel: Fecal material is noted throughout the colon. Some prominent fecal material in the rectum is noted which may represent some early impaction. Diverticular change is seen without specific diverticulitis. Gastric catheter is noted within the stomach. The appendix is not well visualized. Small bowel and stomach are otherwise within normal limits. Vascular/Lymphatic: Atherosclerotic calcifications of the abdominal aorta are noted. There are changes consistent with aortobifemoral bypass graft consistent with the patient's given clinical history. Previously seen  aneurysm sac is not identified. Some surrounding soft tissue density is noted in the operative site likely related to  the recent surgery. No sizable hematoma is noted. Reproductive: Status post prostatectomy. Other: No abdominal wall hernia or abnormality. No abdominopelvic ascites. Musculoskeletal: Degenerative changes of lumbar spine are noted. IMPRESSION: Status post abdominal aortic repair with aortobifemoral bypass graft. Some stranding is noted in the operative site likely related to postoperative change. No sizable hematoma is seen. Diverticulosis without diverticulitis. Near complete consolidation of the right lower lobe. Small left pleural effusion is seen with left basilar atelectasis. Electronically Signed   By: Inez Catalina M.D.   On: 04/21/2020 18:47   DG CHEST PORT 1 VIEW  Result Date: 04/23/2020 CLINICAL DATA:  Pneumonia EXAM: PORTABLE CHEST 1 VIEW COMPARISON:  Five days ago FINDINGS: Increased interstitial coarsening with hazy opacity at the bases with atelectasis or consolidation at the lower lobes on recent abdominal CT. Trace pleural fluid. Normal heart size and stable aortic tortuosity when allowing for rotation. The enteric tube at least reaches the diaphragm. IMPRESSION: 1. New lower lobe opacification with trace pleural fluid, also seen on recent abdominal CT. 2. Vascular congestion. Electronically Signed   By: Monte Fantasia M.D.   On: 04/23/2020 06:44    Labs: BMET Recent Labs  Lab 04/20/20 1515 04/20/20 1515 04/20/20 1955 04/21/20 0236 04/21/20 0850 04/21/20 1620 04/21/20 1655 04/22/20 1023 04/23/20 0202  NA 147*   < > 146* 147* 146* 151* 151* 153* 156*  K 4.2   < > 3.9 3.8 3.8 3.6 3.5 3.6 3.3*  CL 120*  --  118* 118* 119* 122*  --  124* 125*  CO2 17*  --  17* 17* 18* 17*  --  18* 18*  GLUCOSE 120*  --  123* 111* 124* 110*  --  109* 112*  BUN 47*  --  49* 52* 53* 55*  --  53* 54*  CREATININE 2.45*  --  2.42* 2.44* 2.33* 2.31*  --  2.10* 2.06*  CALCIUM 8.4*  --  8.2* 8.1* 8.1* 8.4*  --  8.3* 8.2*  PHOS  --   --   --   --   --   --   --  4.0  --    < > = values in this interval  not displayed.   CBC Recent Labs  Lab 04/21/20 0850 04/21/20 0850 04/21/20 1620 04/21/20 1655 04/22/20 1023 04/23/20 0202  WBC 16.1*  --  18.0*  --  17.7* 16.2*  NEUTROABS  --   --   --   --  15.9*  --   HGB 9.3*   < > 9.9* 9.2* 9.3* 8.9*  HCT 27.7*   < > 29.7* 27.0* 28.7* 27.3*  MCV 98.2  --  98.3  --  97.6 98.6  PLT 114*  --  127*  --  145* 153   < > = values in this interval not displayed.    Medications:    . atorvastatin  10 mg Per Tube QHS  . chlorhexidine  15 mL Mouth Rinse BID  . Chlorhexidine Gluconate Cloth  6 each Topical Daily  . fluticasone furoate-vilanterol  1 puff Inhalation Daily  . lidocaine  2 patch Transdermal Q24H  . mouth rinse  15 mL Mouth Rinse BID  . mouth rinse  15 mL Mouth Rinse q12n4p  . metoprolol tartrate  12.5 mg Per Tube BID  . pantoprazole (PROTONIX) IV  40 mg Intravenous Q12H  . risperiDONE  0.25 mg Sublingual BID  . sodium bicarbonate  1,300 mg Oral BID      [facility] 04/23/2020, 8:56 AM

## 2020-04-23 NOTE — Progress Notes (Addendum)
Progress Note    04/23/2020 7:41 AM 6 Days Post-Op  Subjective: somewhat delirious this morning. Does respond to questions but has to be aroused. Does report pain "all over"   Vitals:   04/23/20 0600 04/23/20 0700  BP: 111/72 112/70  Pulse: 82 (!) 105  Resp: (!) 26 (!) 30  Temp:  99.1 F (37.3 C)  SpO2: 99% 98%   Physical Exam: Cardiac: irregularly irregular Lungs:  Non labored Incisions:  Right groin incision clean, dry and intact without swelling or hematoma. Laparotomy incision is clean, dry and intact. Some ecchymosis present Extremities:  Moving all extremities without deficit. 2+ femoral pulses bilaterally, 2+ DP pulse, feet warm and well perfused. 2+ radial pulses bilaterally Abdomen:  Soft, non distended, expected tenderness Neurologic: alert, oriented x 1  CBC    Component Value Date/Time   WBC 16.2 (H) 04/23/2020 0202   RBC 2.77 (L) 04/23/2020 0202   HGB 8.9 (L) 04/23/2020 0202   HGB 12.1 (L) 08/20/2012 0610   HCT 27.3 (L) 04/23/2020 0202   HCT 44.0 08/02/2012 0853   PLT 153 04/23/2020 0202   PLT 158 08/20/2012 0610   MCV 98.6 04/23/2020 0202   MCV 99 08/02/2012 0853   MCH 32.1 04/23/2020 0202   MCHC 32.6 04/23/2020 0202   RDW 16.4 (H) 04/23/2020 0202   RDW 13.7 08/02/2012 0853   LYMPHSABS 0.5 (L) 04/22/2020 1023   MONOABS 1.1 (H) 04/22/2020 1023   EOSABS 0.0 04/22/2020 1023   BASOSABS 0.0 04/22/2020 1023    BMET    Component Value Date/Time   NA 156 (H) 04/23/2020 0202   NA 136 08/20/2012 0610   K 3.3 (L) 04/23/2020 0202   K 4.2 08/20/2012 0610   CL 125 (H) 04/23/2020 0202   CL 102 08/20/2012 0610   CO2 18 (L) 04/23/2020 0202   CO2 26 08/20/2012 0610   GLUCOSE 112 (H) 04/23/2020 0202   GLUCOSE 123 (H) 08/20/2012 0610   BUN 54 (H) 04/23/2020 0202   BUN 7 08/20/2012 0610   CREATININE 2.06 (H) 04/23/2020 0202   CREATININE 0.75 08/20/2012 0610   CALCIUM 8.2 (L) 04/23/2020 0202   CALCIUM 8.4 (L) 08/20/2012 0610   GFRNONAA 32 (L) 04/23/2020  0202   GFRNONAA >60 08/20/2012 0610   GFRAA >60 05/07/2017 0949   GFRAA >60 08/20/2012 0610    INR    Component Value Date/Time   INR 1.2 04/17/2020 1357   INR 1.0 08/02/2012 0853     Intake/Output Summary (Last 24 hours) at 04/23/2020 0741 Last data filed at 04/23/2020 0700 Gross per 24 hour  Intake 2506.67 ml  Output 1980 ml  Net 526.67 ml     Assessment/Plan:  79 y.o. male is s/p Open abdominal and common iliac artery aneurysm and right common femoral artery aneurysm repair with infrarenal aorta to left hypogastric and transposition of the left external iliac artery bypass and right common femoral artery bypass with 16 x 8 mm Dacron  6 Days Post-Op   Renal: Scr improved a little 2.06.  Urine output appears to be adequate. Now on D5 1/2 NS and given Sodium Bicarb per nephrology. Still hyponatremic. Appreciate nephrology assistance. Cardiac: Remains an A. fib.  Rate is a little better controlled.  Started on Heparin gtt yesterday. Appreciate cardiology assistance GI: Remain n.p.o.  We will continue to monitor his NG tube output. 375 yesterday. No updated output this morning. Hopefully this can be removed in the near future.  Last BM 10/24 which was  non bloody. Does not report any flatus. CT scan was unremarkable. Continuing to monitor for intestinal ischemia ID: The patient is on Zosyn for possible intestinal ischemia/pulmonary process.  Remains afebrile. WBC trending down.  Pain: Continue to try to decrease Precedex today.  On IV Tylenol. Will try to wean per family request due to delirium Neuro: Delirium over night. CT head w/o contrast ordered. Likely secondary to pain meds/ hospital psychosis  Daughter present and updated at bedside  DVT prophylaxis: Heparin gtt   Carlos Caldwell, PA-C [facility] [phone] 04/23/2020 7:41 AM   I agree with the above.  I have seen and evaluated the patient.  We will get CT scan of the head given his persistent  delirium today.  We will need to continue to monitor his hyponatremia.  NG tube output is blood-tinged.  Output is decreasing.  Hopefully this can be removed in the near future.  Continue IV Zosyn.  White blood cell count is trending down.  Creatinine is decreased today with good urine output.  He is now on IV heparin for atrial fibrillation.  His hemoglobin remained stable.  Carlos Olson

## 2020-04-23 NOTE — Progress Notes (Signed)
Initial Nutrition Assessment  DOCUMENTATION CODES:   Not applicable  INTERVENTION:   Begin trickle TF via NG tube: Osmolite 1.2 at 20 ml/h (480 ml per day)  Provides 576 kcal, 27 gm protein, 394 ml free water daily  When able to advance TF, recommend goal rate 80 ml/h with Prosource TF 45 ml BID to provide 2384 kcal, 129 gm protein, 1574 ml free water daily.  NUTRITION DIAGNOSIS:   Increased nutrient needs related to acute illness as evidenced by estimated needs.  GOAL:   Patient will meet greater than or equal to 90% of their needs  MONITOR:   TF tolerance, Diet advancement, Labs  REASON FOR ASSESSMENT:   Rounds    ASSESSMENT:   79 yo male admitted 10/18 for elective endovascular repair of AAA. PMH includes AAA, prostate Ca, heart murmur, diverticulitis, COPD, CAD, atherosclerosis, IBS.   Discussed patient in ICU rounds and with RN today. Patient is agitated, ICU delirium. Currently NPO.  Per RN, Dr. Trula Slade requested RD to begin trickle TF today. NG tube in place. S/P head CT this morning.   Patient has been NPO since surgery x 6 days, increasing risk of malnutrition. Patient out of room for CT, unable to complete NFPE.  Labs reviewed. Sodium 154, potassium 3.3, BUN 47, Creat 1.96 CBG: 121  Medications reviewed and include Aranesp, sodium bicarb, Precedex.  Usual weights reviewed, no significant weight changes noted.   I/O +5.5 L since admission. UOP 1905 ml x 24 hours. NG output 375 ml yesterday, 350 ml documented so far today. Plans to stop suction and begin TF.   Diet Order:   Diet Order            Diet NPO time specified  Diet effective now                 EDUCATION NEEDS:   No education needs have been identified at this time  Skin:  Skin Assessment: Reviewed RN Assessment (abd & R groin surgical incisions)  Last BM:  10/24  Height:   Ht Readings from Last 1 Encounters:  04/22/20 6\' 3"  (1.905 m)    Weight:   Wt Readings from Last  1 Encounters:  04/22/20 95.3 kg    Ideal Body Weight:  89.1 kg  BMI:  Body mass index is 26.26 kg/m.  Estimated Nutritional Needs:   Kcal:  2200-2400  Protein:  110-130 gm  Fluid:  2.2-2.4 L    Lucas Mallow, RD, LDN, CNSC Please refer to Amion for contact information.

## 2020-04-23 NOTE — Progress Notes (Signed)
PT Cancellation Note  Patient Details Name: GEAN LAROSE MRN: [id number] DOB: [birth year]   Cancelled Treatment:    Reason Eval/Treat Not Completed: Medical issues which prohibited therapy (Pt HR in the 160's.  Nurse asked PT to [place] 04/23/2020, 2:50 PM  Adelynn Gipe W,PT Acute Rehabilitation Services Pager:  [phone]  Office:  [phone]

## 2020-04-23 NOTE — Progress Notes (Signed)
[facility] Progress Note Patient Name: ISAK SOTOMAYOR DOB: 1940-10-22 MRN: [id number]   Date of Service  04/23/2020  HPI/Events of Note  RN reporting K,  eRN unable to replace under elink electrolyte replacement protocol with B/C 54/2.06. Pt is NPO, and only have one peripheral, so will have to go peripheral iv. NG is not to be used per Therapist, sports.   Results for JEVEN, TOPPER "PETE" (MRN [id number]) as of 04/23/2020 03:15  04/23/2020 02:02 Sodium: 156 (H) Potassium: 3.3 (L) Chloride: 125 (H) CO2: 18 (L) Glucose: 112 (H) BUN: 54 (H) Creatinine: 2.06 (H) Calcium: 8.2 (L) Anion gap: 13 GFR, Estimated: 32 (L)   eICU Interventions  - Kcl 10 meq q1 hr x 2 doses for now. Getting d5 at 75 ml/hr for NGMA from AKI and hypernatremia/hyperchloremia. Nephrology on the case. NPO so did not get NG bicarb as per RN. Consider IV bicarb if worsening Non AGMA.      Intervention Category Intermediate Interventions: Electrolyte abnormality - evaluation and management  Elmer Sow 04/23/2020, 3:26 AM

## 2020-04-23 NOTE — Progress Notes (Signed)
[place] for heparin Indication: atrial fibrillation  Allergies  Allergen Reactions  . Levaquin [Levofloxacin]     Aortic Aneurysm  . Sulfonamide Derivatives Itching  . Sulfasalazine Itching    Patient Measurements: Height: 6\' 3"  (190.5 cm) Weight: 95.3 kg (210 lb 1.6 oz) IBW/kg (Calculated) : 84.5 Heparin Dosing Weight: 95.3 kg   Vital Signs: Temp: 96 F (35.6 C) (10/25 2043) Temp Source: Oral (10/25 2043) BP: 128/71 (10/25 2200) Pulse Rate: 116 (10/25 2200)  Labs: Recent Labs    04/21/20 1620 04/21/20 1620 04/21/20 1655 04/21/20 1655 04/22/20 1023 04/23/20 0202 04/23/20 1142 04/23/20 1144 04/23/20 2136  HGB 9.9*   < > 9.2*   < > 9.3* 8.9*  --   --   --   HCT 29.7*   < > 27.0*  --  28.7* 27.3*  --   --   --   PLT 127*  --   --   --  145* 153  --   --   --   HEPARINUNFRC  --   --   --   --   --  0.29* 0.23*  --  0.20*  CREATININE 2.31*  --   --    < > 2.10* 2.06*  --  1.96*  --    < > = values in this interval not displayed.    Estimated Creatinine Clearance: 35.9 mL/min (A) (by C-G formula based on SCr of 1.96 mg/dL (H)).   Medical History: Past Medical History:  Diagnosis Date  . AAA (abdominal aortic aneurysm) ([place])   . Atherosclerosis of aortic arch ([place]) 09/25/2015   By CT scan   . CAD (coronary artery disease) 09/25/2015   Mild 3v by CT scan   . Cancer [facility])    prostate  . Colon polyps   . COPD (chronic obstructive pulmonary disease) ([place])   . Diverticulosis of colon   . Femoral bruit    bilateral, found by Dr. Jefm Bryant  . Heart murmur    child  . History of prostate cancer   . IBS (irritable bowel syndrome)    improved after retirement  . 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 Pelvic fracture ([place])   . Pneumonia    child  . Pulmonary nodules 09/25/2015   On screening CT scan, rpt 1 yr   . Shortness of breath    doe    Medications:  Scheduled:  .  atorvastatin  10 mg Per Tube QHS  . chlorhexidine  15 mL Mouth Rinse BID  . Chlorhexidine Gluconate Cloth  6 each Topical Daily  . darbepoetin (ARANESP) injection - NON-DIALYSIS  100 mcg Subcutaneous Q Mon-1800  . feeding supplement (OSMOLITE 1.2 CAL)  1,000 mL Per Tube Q24H  . fluticasone furoate-vilanterol  1 puff Inhalation Daily  . lidocaine  2 patch Transdermal Q24H  . mouth rinse  15 mL Mouth Rinse BID  . mouth rinse  15 mL Mouth Rinse q12n4p  . metoprolol tartrate  12.5 mg Per Tube BID  . metoprolol tartrate      . pantoprazole (PROTONIX) IV  40 mg Intravenous Q12H  . risperiDONE  0.25 mg Sublingual BID  . sodium bicarbonate  1,300 mg Oral BID    Assessment: 54 yom who had elective endovascular repair of abdominal aortic aneurysm on 10/18. Has been having AKI and mild hypoxia in addition to Afib. No AC PTA. Okay to start [facility] per teams without  bolus.   Heparin level 0.20 despite rate adjustment earlier, CBC stable.  No known issues with IV heparin infusion.  Goal of Therapy:  Heparin level 0.3-0.7 units/ml Monitor platelets by anticoagulation protocol: Yes   Plan:  -Heparin increase to 1650 units/h -Recheck heparin level in 8h -Daily heparin level and CBC   [place] Crane, Vena [place], BCPS, BCCP Clinical Pharmacist  04/23/2020 10:19 PM   [facility] pharmacy phone numbers are listed on [email or web address]

## 2020-04-23 NOTE — Progress Notes (Signed)
NAME:  Carlos Olson, MRN:  [id number], DOB:  1941/02/18, LOS: 6 ADMISSION DATE:  04/17/2020, CONSULTATION DATE: 10/21 REFERRING MD: Dr. Donzetta Matters, CHIEF COMPLAINT: AKI with mild hypoxia  Brief History   79 year old male who presented 10/18 for elective endovascular repair abdominal aortic aneurysm with Dr. Donzetta Matters. PCCM consulted 10/21 for assistance in medical management given AKI and mild hypoxia  History of present illness   Carlos Olson is a 79 year old male with a past medical history significant for AAA, history of prostate cancer, heart murmur, diverticulitis, COPD, CAD, and atherosclerosis who presented for elective endovascular repair of abdominal aortic aneurysm with Dr. Donzetta Matters. Overnight postop day 0 patient required low-dose pressor support for mild hypotension, vascular surgery consulted cardiology initially for assistance of hypotension.  Morning of 10/21 patient was seen with worsening creatinine with rise from 1.19-3.10 10/21 and decreasing urine output with approximately 245 mL of urine out last 24 hours. Additionally he was seen with worsening confusion after receiving opioid administration. Pressor support discontinued evening of 12/20. Given worsened AKI and mild hypoxia PCCM consulted for further medical management.  Past Medical History  AAA History of prostate cancer Heart murmur Diverticulitis COPD CAD atherosclerosis  Significant Hospital Events   Admitted 10/18 for elective AAA repair  Consults:  Cardiology PCCM  Procedures:  Endovascular AAA repair 10/19  Significant Diagnostic Tests:    Micro Data:    Antimicrobials:    Interim history/subjective:   Remains in the intensive care unit.  Still on Precedex.  Less confused.  Objective   Blood pressure 129/72, pulse (!) 114, temperature 99.1 F (37.3 C), temperature source Oral, resp. rate (!) 24, height 6\' 3"  (1.905 m), weight 95.3 kg, SpO2 96 %.        Intake/Output Summary (Last 24 hours) at  04/23/2020 1017 Last data filed at 04/23/2020 0800 Gross per 24 hour  Intake 2229.67 ml  Output 1600 ml  Net 629.67 ml   Filed Weights   04/17/20 0603 04/22/20 1507  Weight: 95.3 kg 95.3 kg    Examination: General: Elderly chronically ill-appearing male resting in bed confused HEENT: Tracking appropriately Neuro: Alert to voice follows basic commands communicative CV: Irregularly irregular, S1-S2 PULM: Bilateral mechanically ventilated breath sounds GI: Soft nontender nondistended midline incision and right groin incision CDI Extremities: No significant edema Skin: Bruising around incision sites  Resolved Hospital Problem list     Assessment & Plan:   Acute Kidney Injury with oliguria -Lab work on admission 10/19 revealed creatinine of 1.13 and GFR 58, morning of PCCM consultation creatinine is now 3.10 with GFR 18 -Patient is currently 5 L fluid positive Metabolic acidosis P: Kidney function improving Repeat labs daily. Lasix per nephrology. Avoid nephrotoxic agents, holding ACE inhibitor  Sepsis, RLL pneumonia, HCAP  MRSA PCR Neg  Leukocytosis  Plan: Continue Zosyn Pulmonary hygiene, airway clearance techniques.  Acute metabolic encephalopathy -Likely multifactorial including ICU delirium and benzodiazepine utilization in elderly patient -Likely component of ICU delirium - secondary to sepsis  P: Avoid benzos Titrating off Precedex Continue low-dose Risperdal Avoid opiates if possible. Continue Tylenol for pain control  History of COPD not currently in acute exasperation Former smoker History of pulmonary nodules -Primary pulmonologist is Dr. Lamonte Sakai, last seen in pulmonary clinic 11/25/2019 P: Continue scheduled bronchodilators and as needed  Intermittent tachycardia with possible underlying atrial fibrillation -Patient seen with frequent ectopy and tachycardia overnight of 10/21, rhythm improved after receiving labetalol -Developed A. 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  Best practice:  Diet: NPO Pain/Anxiety/Delirium protocol (if indicated): PRN VAP protocol (if indicated): N/A DVT prophylaxis: Heparin GI prophylaxis: PPI Glucose control: Monitor  Mobility: Bedrest Code Status: Full Family Communication: Up with assistance  Disposition: ICU  Labs   CBC: Recent Labs  Lab 04/21/20 0236 04/21/20 0236 04/21/20 0850 04/21/20 1620 04/21/20 1655 04/22/20 1023 04/23/20 0202  WBC 14.2*  --  16.1* 18.0*  --  17.7* 16.2*  NEUTROABS  --   --   --   --   --  15.9*  --   HGB 9.0*   < > 9.3* 9.9* 9.2* 9.3* 8.9*  HCT 27.3*   < > 27.7* 29.7* 27.0* 28.7* 27.3*  MCV 99.3  --  98.2 98.3  --  97.6 98.6  PLT 114*  --  114* 127*  --  145* 153   < > = values in this interval not displayed.    Basic Metabolic Panel: Recent Labs  Lab 04/18/20 0855 04/18/20 1702 04/19/20 0250 04/19/20 0829 04/20/20 0229 04/20/20 1515 04/21/20 0236 04/21/20 0236 04/21/20 0850 04/21/20 1620 04/21/20 1655 04/21/20 1914 04/22/20 1023 04/23/20 0202  NA 142   < > 143   < > 145   < > 147*   < > 146* 151* 151*  --  153* 156*  K 5.1   < > 5.1   < > 4.7   < > 3.8   < > 3.8 3.6 3.5  --   3.6 3.3*  CL 115*   < > 118*   < > 120*   < > 118*  --  119* 122*  --   --  124* 125*  CO2 16*   < > 16*   < > 16*   < > 17*  --  18* 17*  --   --  18* 18*  GLUCOSE 155*   < > 144*   < > 137*   < > 111*  --  124* 110*  --   --  109* 112*  BUN 29*   < > 41*   < > 50*   < > 52*  --  53* 55*  --   --  53* 54*  CREATININE 2.42*   < > 3.10*   < > 2.71*   < > 2.44*  --  2.33* 2.31*  --   --  2.10* 2.06*  CALCIUM 8.1*   < > 8.0*   < > 7.9*   < > 8.1*  --  8.1* 8.4*  --   --  8.3* 8.2*  MG 1.5*  --  2.5*  --  2.5*  --   --   --   --   --   --  2.4 2.2  --   PHOS  --   --   --   --   --   --   --   --   --   --   --   --  4.0  --    < > = values in this interval not displayed.   GFR: Estimated Creatinine Clearance: 34.2 mL/min (A) (by C-G formula based on SCr of 2.06 mg/dL (H)). Recent Labs  Lab 04/20/20 1159 04/20/20 1515 04/20/20 1949 04/20/20 1955 04/21/20 0850 04/21/20 1620 04/22/20 1023 04/23/20 0202  WBC  --    < >  --    < > 16.1* 18.0* 17.7* 16.2*  LATICACIDVEN 1.1  --  1.0  --  1.0  --   --   --    < > = values in this interval not displayed.    Liver Function Tests: Recent Labs  Lab 04/18/20 0855 04/20/20 1955 04/21/20 0850  AST 28 45* 40  ALT 14 21 18   ALKPHOS 34* 48 49  BILITOT 0.5 0.8 0.7  PROT 4.8* 4.6* 4.7*  ALBUMIN 2.9* 2.0* 1.8*   Recent Labs  Lab 04/18/20 0855 04/21/20 1620  LIPASE  --  36  AMYLASE 26*  --    Recent Labs  Lab 04/21/20 1620  AMMONIA 38*    ABG    Component Value Date/Time   PHART 7.435 04/21/2020 1655   PCO2ART 26.8 (L) 04/21/2020 1655   PO2ART 47 (L) 04/21/2020 1655   HCO3 17.9 (L) 04/21/2020 1655   TCO2 19 (L) 04/21/2020 1655   ACIDBASEDEF 5.0 (H) 04/21/2020 1655   O2SAT 83.0 04/21/2020 1655     Coagulation Profile: Recent Labs  Lab 04/17/20 1357  INR 1.2    Cardiac Enzymes: No results for input(s): CKTOTAL, CKMB, CKMBINDEX, TROPONINI in the last 168 hours.  HbA1C: No results found for: HGBA1C  CBG: Recent Labs   Lab 04/22/20 1504 04/22/20 1948 04/22/20 2333 04/23/20 0402 04/23/20 0738  GLUCAP 101* 112* 104* 110* 121*    This patient is critically ill with multiple organ system failure; which, requires frequent high complexity decision making, assessment, support, evaluation, and titration of therapies. This was completed through the application of advanced monitoring technologies and extensive interpretation of multiple databases. During this encounter critical care time was devoted to patient care services described in this note for 32 minutes.  Garner Nash, DO [facility] 04/23/2020 10:18 AM

## 2020-04-23 NOTE — Progress Notes (Signed)
Progress Note   Subjective   Agitated overnight- suspect ICU delerium. Remains in afib with fast rates at times in the 160's. Started on heparin yesterday.  Inpatient Medications    Scheduled Meds: . atorvastatin  10 mg Per Tube QHS  . chlorhexidine  15 mL Mouth Rinse BID  . Chlorhexidine Gluconate Cloth  6 each Topical Daily  . fluticasone furoate-vilanterol  1 puff Inhalation Daily  . lidocaine  2 patch Transdermal Q24H  . mouth rinse  15 mL Mouth Rinse BID  . mouth rinse  15 mL Mouth Rinse q12n4p  . metoprolol tartrate  5 mg Intravenous 6 X Daily  . pantoprazole (PROTONIX) IV  40 mg Intravenous Q12H  . risperiDONE  0.25 mg Sublingual BID  . sodium bicarbonate  1,300 mg Oral BID   Continuous Infusions: . sodium chloride 250 mL (04/23/20 0343)  . amiodarone 30 mg/hr (04/23/20 0700)  . dexmedetomidine (PRECEDEX) IV infusion 0.5 mcg/kg/hr (04/23/20 0700)  . dextrose 75 mL/hr at 04/23/20 0700  . heparin 1,350 Units/hr (04/23/20 0700)  . piperacillin-tazobactam (ZOSYN)  IV 12.5 mL/hr at 04/23/20 0700   PRN Meds: acetaminophen **OR** acetaminophen, alum & mag hydroxide-simeth, bisacodyl, guaiFENesin-dextromethorphan, hydrALAZINE, HYDROmorphone (DILAUDID) injection, ondansetron, phenol, sodium phosphate   Vital Signs    Vitals:   04/23/20 0404 04/23/20 0500 04/23/20 0600 04/23/20 0700  BP:  119/65 111/72 112/70  Pulse:  90 82 (!) 105  Resp:  17 (!) 26 (!) 30  Temp: 99.3 F (37.4 C)   99.1 F (37.3 C)  TempSrc: Axillary   Oral  SpO2:  99% 99% 98%  Weight:      Height:        Intake/Output Summary (Last 24 hours) at 04/23/2020 0827 Last data filed at 04/23/2020 0700 Gross per 24 hour  Intake 2213.24 ml  Output 1755 ml  Net 458.24 ml   Filed Weights   04/17/20 0603 04/22/20 1507  Weight: 95.3 kg 95.3 kg    Telemetry    Afib with RVR, up into the 160's- Personally Reviewed  Physical Exam   General appearance: alert and no distress Neck: no carotid  bruit, no JVD and thyroid not enlarged, symmetric, no tenderness/mass/nodules Lungs: diminished breath sounds bilaterally Heart: irregularly irregular rhythm and tachycardic Abdomen: large midline incision, ecchymosis Extremities: extremities normal, atraumatic, no cyanosis or edema Pulses: 2+ and symmetric Skin: pale, warm, dry Neurologic: Mental status: sedated, calm today Psych: Cannot assess  Labs    Chemistry Recent Labs  Lab 04/18/20 0855 04/18/20 1702 04/20/20 1955 04/21/20 0236 04/21/20 0850 04/21/20 0850 04/21/20 1620 04/21/20 1620 04/21/20 1655 04/22/20 1023 04/23/20 0202  NA 142   < > 146*   < > 146*   < > 151*   < > 151* 153* 156*  K 5.1   < > 3.9   < > 3.8   < > 3.6   < > 3.5 3.6 3.3*  CL 115*   < > 118*   < > 119*   < > 122*  --   --  124* 125*  CO2 16*   < > 17*   < > 18*   < > 17*  --   --  18* 18*  GLUCOSE 155*   < > 123*   < > 124*   < > 110*  --   --  109* 112*  BUN 29*   < > 49*   < > 53*   < > 55*  --   --  53* 54*  CREATININE 2.42*   < > 2.42*   < > 2.33*   < > 2.31*  --   --  2.10* 2.06*  CALCIUM 8.1*   < > 8.2*   < > 8.1*   < > 8.4*  --   --  8.3* 8.2*  PROT 4.8*  --  4.6*  --  4.7*  --   --   --   --   --   --   ALBUMIN 2.9*  --  2.0*  --  1.8*  --   --   --   --   --   --   AST 28  --  45*  --  40  --   --   --   --   --   --   ALT 14  --  21  --  18  --   --   --   --   --   --   ALKPHOS 34*  --  48  --  49  --   --   --   --   --   --   BILITOT 0.5  --  0.8  --  0.7  --   --   --   --   --   --   GFRNONAA 24*   < > 26*   < > 28*   < > 28*  --   --  31* 32*  ANIONGAP 11   < > 11   < > 9   < > 12  --   --  11 13   < > = values in this interval not displayed.     Hematology Recent Labs  Lab 04/21/20 1620 04/21/20 1620 04/21/20 1655 04/22/20 1023 04/23/20 0202  WBC 18.0*  --   --  17.7* 16.2*  RBC 3.02*  --   --  2.94* 2.77*  HGB 9.9*   < > 9.2* 9.3* 8.9*  HCT 29.7*   < > 27.0* 28.7* 27.3*  MCV 98.3  --   --  97.6 98.6  MCH 32.8  --    --  31.6 32.1  MCHC 33.3  --   --  32.4 32.6  RDW 16.8*  --   --  16.3* 16.4*  PLT 127*  --   --  145* 153   < > = values in this interval not displayed.     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  Assessment & Plan    1.  afib Rates remain elevated Secondary to acute medical illness / delerium Continue IV amiodarone Continue IV heparin D/c IV lopressor and switch to per tube lopressor 12.5 BID when tube feeds start  2. Acute renal failure Low urine output  Nephrology notes reviewed ACEi on hold Creatinine stable around 2.1  3. HTN Stable, bp's improved  4. Delirium Stable No change required today  5. Thrombocytopenia/ anemia Hemoglobin around 9 Transfuse for hb <7 g/dl  CRITICAL CARE TIME: I have spent a total of 35 minutes with patient reviewing hospital notes, telemetry, EKGs, labs and examining the patient as well as establishing an assessment and plan that was discussed with the patient. >  50% of time was spent in direct patient care. The patient is critically ill with multi-organ system failure and requires high complexity decision making for assessment and support, frequent evaluation and titration of therapies, application of advanced monitoring technologies and extensive interpretation of multiple databases.  Pixie Casino, MD, [facility], [place] Director of the Advanced Lipid Disorders &  Cardiovascular Risk Reduction Clinic Diplomate of the American Board of Clinical Lipidology Attending Cardiologist  Direct Dial: [phone]  Fax: [phone]  Website:  [email or web address]  04/23/2020 8:27 AM

## 2020-04-23 NOTE — Progress Notes (Signed)
OT Cancellation Note  Patient Details Name: Carlos Olson MRN: [id number] DOB: [birth year]   Cancelled Treatment:    Reason Eval/Treat Not Completed: Medical issues which prohibited therapy (Pt with resting HR in the 160s.)  Malka So 04/23/2020, 2:42 PM  Nestor Lewandowsky, OTR/L Acute Rehabilitation Services Pager: [phone] Office: [phone]

## 2020-04-23 NOTE — Progress Notes (Signed)
Bed CPT held due to bed.

## 2020-04-23 NOTE — Progress Notes (Signed)
Pharmacy Antibiotic Note  Carlos Olson is a 79 y.o. male admitted on 04/17/2020 with sepsis and possible PNA. Pt has been on Zosyn. WBC trending down, afebrile.  Plan: Zosyn 3.375g IV EI q8h Follow LOT  Height: 6\' 3"  (190.5 cm) Weight: 95.3 kg (210 lb 1.6 oz) IBW/kg (Calculated) : 84.5  Temp (24hrs), Avg:99.3 F (37.4 C), Min:97.9 F (36.6 C), Max:100.4 F (38 C)  Recent Labs  Lab 04/20/20 1159 04/20/20 1515 04/20/20 1949 04/20/20 1955 04/21/20 0236 04/21/20 0850 04/21/20 1620 04/22/20 1023 04/23/20 0202  WBC  --    < >  --    < > 14.2* 16.1* 18.0* 17.7* 16.2*  CREATININE  --    < >  --    < > 2.44* 2.33* 2.31* 2.10* 2.06*  LATICACIDVEN 1.1  --  1.0  --   --  1.0  --   --   --    < > = values in this interval not displayed.    Estimated Creatinine Clearance: 34.2 mL/min (A) (by C-G formula based on SCr of 2.06 mg/dL (H)).    Allergies  Allergen Reactions  . Levaquin [Levofloxacin]     Aortic Aneurysm  . Sulfonamide Derivatives Itching  . Sulfasalazine Itching    Antimicrobials this admission: Zosyn 10/22 >>   Thank you for allowing pharmacy to be a part of this patient's care.  Arrie Senate, PharmD, BCPS Clinical Pharmacist [phone] Please check AMION for all [place] numbers 04/23/2020

## 2020-04-23 NOTE — Progress Notes (Signed)
[place] for heparin Indication: atrial fibrillation  Allergies  Allergen Reactions  . Levaquin [Levofloxacin]     Aortic Aneurysm  . Sulfonamide Derivatives Itching  . Sulfasalazine Itching    Patient Measurements: Height: 6\' 3"  (190.5 cm) Weight: 95.3 kg (210 lb 1.6 oz) IBW/kg (Calculated) : 84.5 Heparin Dosing Weight: 95.3 kg   Vital Signs: Temp: 98.2 F (36.8 C) (10/25 1100) Temp Source: Oral (10/25 1100) BP: 138/72 (10/25 1000) Pulse Rate: 102 (10/25 1100)  Labs: Recent Labs    04/21/20 1620 04/21/20 1620 04/21/20 1655 04/21/20 1655 04/22/20 1023 04/23/20 0202  HGB 9.9*   < > 9.2*   < > 9.3* 8.9*  HCT 29.7*   < > 27.0*  --  28.7* 27.3*  PLT 127*  --   --   --  145* 153  HEPARINUNFRC  --   --   --   --   --  0.29*  CREATININE 2.31*  --   --   --  2.10* 2.06*   < > = values in this interval not displayed.    Estimated Creatinine Clearance: 34.2 mL/min (A) (by C-G formula based on SCr of 2.06 mg/dL (H)).   Medical History: Past Medical History:  Diagnosis Date  . AAA (abdominal aortic aneurysm) ([place])   . Atherosclerosis of aortic arch ([place]) 09/25/2015   By CT scan   . CAD (coronary artery disease) 09/25/2015   Mild 3v by CT scan   . Cancer [facility])    prostate  . Colon polyps   . COPD (chronic obstructive pulmonary disease) ([place])   . Diverticulosis of colon   . Femoral bruit    bilateral, found by Dr. Jefm Bryant  . Heart murmur    child  . History of prostate cancer   . IBS (irritable bowel syndrome)    improved after retirement  . 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 Pelvic fracture ([place])   . Pneumonia    child  . Pulmonary nodules 09/25/2015   On screening CT scan, rpt 1 yr   . Shortness of breath    doe    Medications:  Scheduled:  . atorvastatin  10 mg Per Tube QHS  . chlorhexidine  15 mL Mouth Rinse BID  . Chlorhexidine Gluconate Cloth  6 each Topical  Daily  . darbepoetin (ARANESP) injection - NON-DIALYSIS  100 mcg Subcutaneous Q Mon-1800  . fluticasone furoate-vilanterol  1 puff Inhalation Daily  . lidocaine  2 patch Transdermal Q24H  . mouth rinse  15 mL Mouth Rinse BID  . mouth rinse  15 mL Mouth Rinse q12n4p  . metoprolol tartrate  12.5 mg Per Tube BID  . pantoprazole (PROTONIX) IV  40 mg Intravenous Q12H  . risperiDONE  0.25 mg Sublingual BID  . sodium bicarbonate  1,300 mg Oral BID    Assessment: 46 yom who had elective endovascular repair of abdominal aortic aneurysm on 10/18. Has been having AKI and mild hypoxia in addition to Afib. No AC PTA. Okay to start [facility] per teams without bolus.   Heparin level 0.23 despite rate adjustment earlier, CBC stable.  Goal of Therapy:  Heparin level 0.3-0.7 units/ml Monitor platelets by anticoagulation protocol: Yes   Plan:  -Heparin increase to 1500 units/h -Recheck heparin level in 8h -Daily heparin level and CBC   Arrie Senate, PharmD, BCPS Clinical Pharmacist [phone] Please check AMION for all [place] numbers 04/23/2020

## 2020-04-23 NOTE — Progress Notes (Signed)
[place] for heparin Indication: atrial fibrillation  Allergies  Allergen Reactions  . Levaquin [Levofloxacin]     Aortic Aneurysm  . Sulfonamide Derivatives Itching  . Sulfasalazine Itching    Patient Measurements: Height: 6\' 3"  (190.5 cm) Weight: 95.3 kg (210 lb 1.6 oz) IBW/kg (Calculated) : 84.5 Heparin Dosing Weight: 95.3 kg   Vital Signs: Temp: 97.9 F (36.6 C) (10/25 0001) Temp Source: Axillary (10/25 0001) BP: 138/72 (10/25 0300) Pulse Rate: 124 (10/25 0300)  Labs: Recent Labs    04/21/20 1620 04/21/20 1620 04/21/20 1655 04/21/20 1655 04/22/20 1023 04/23/20 0202  HGB 9.9*   < > 9.2*   < > 9.3* 8.9*  HCT 29.7*   < > 27.0*  --  28.7* 27.3*  PLT 127*  --   --   --  145* 153  HEPARINUNFRC  --   --   --   --   --  0.29*  CREATININE 2.31*  --   --   --  2.10* 2.06*   < > = values in this interval not displayed.    Estimated Creatinine Clearance: 34.2 mL/min (A) (by C-G formula based on SCr of 2.06 mg/dL (H)).   Medical History: Past Medical History:  Diagnosis Date  . AAA (abdominal aortic aneurysm) ([place])   . Atherosclerosis of aortic arch ([place]) 09/25/2015   By CT scan   . CAD (coronary artery disease) 09/25/2015   Mild 3v by CT scan   . Cancer [facility])    prostate  . Colon polyps   . COPD (chronic obstructive pulmonary disease) ([place])   . Diverticulosis of colon   . Femoral bruit    bilateral, found by Dr. Jefm Bryant  . Heart murmur    child  . History of prostate cancer   . IBS (irritable bowel syndrome)    improved after retirement  . 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 Pelvic fracture ([place])   . Pneumonia    child  . Pulmonary nodules 09/25/2015   On screening CT scan, rpt 1 yr   . Shortness of breath    doe    Medications:  Scheduled:  . atorvastatin  10 mg Per Tube QHS  . chlorhexidine  15 mL Mouth Rinse BID  . Chlorhexidine Gluconate Cloth  6 each Topical  Daily  . fluticasone furoate-vilanterol  1 puff Inhalation Daily  . lidocaine  2 patch Transdermal Q24H  . mouth rinse  15 mL Mouth Rinse BID  . mouth rinse  15 mL Mouth Rinse q12n4p  . metoprolol tartrate  5 mg Intravenous 6 X Daily  . pantoprazole (PROTONIX) IV  40 mg Intravenous Q12H  . risperiDONE  0.25 mg Sublingual BID  . sodium bicarbonate  1,300 mg Oral BID    Assessment: 50 yom who had elective endovascular repair of abdominal aortic aneurysm on 10/18. Has been having AKI and mild hypoxia in addition to Afib. No AC PTA. Okay to start [facility] per teams without bolus.   Hgb 9.3, plt 145 (stable/improving). Last received subQ heparin at 1350 on 10/24. No s/s xof bleeding.   10/25 AM update:  Heparin level just below goal  No issues per RN  Goal of Therapy:  Heparin level 0.3-0.7 units/ml Monitor platelets by anticoagulation protocol: Yes   Plan:  Inc heparin to 1350 units/hr 1200 heparin level  Narda Bonds, PharmD, BCPS Clinical Pharmacist Phone: [phone]

## 2020-04-24 DIAGNOSIS — I714 Abdominal aortic aneurysm, without rupture: Secondary | ICD-10-CM | POA: Diagnosis not present

## 2020-04-24 DIAGNOSIS — N179 Acute kidney failure, unspecified: Secondary | ICD-10-CM | POA: Diagnosis not present

## 2020-04-24 DIAGNOSIS — I4819 Other persistent atrial fibrillation: Secondary | ICD-10-CM | POA: Diagnosis not present

## 2020-04-24 LAB — CBC
HCT: 27.5 % — ABNORMAL LOW (ref 39.0–52.0)
Hemoglobin: 9 g/dL — ABNORMAL LOW (ref 13.0–17.0)
MCH: 32.6 pg (ref 26.0–34.0)
MCHC: 32.7 g/dL (ref 30.0–36.0)
MCV: 99.6 fL (ref 80.0–100.0)
Platelets: 202 10*3/uL (ref 150–400)
RBC: 2.76 MIL/uL — ABNORMAL LOW (ref 4.22–5.81)
RDW: 16.5 % — ABNORMAL HIGH (ref 11.5–15.5)
WBC: 14.3 10*3/uL — ABNORMAL HIGH (ref 4.0–10.5)
nRBC: 0.1 % (ref 0.0–0.2)

## 2020-04-24 LAB — GLUCOSE, CAPILLARY
Glucose-Capillary: 118 mg/dL — ABNORMAL HIGH (ref 70–99)
Glucose-Capillary: 120 mg/dL — ABNORMAL HIGH (ref 70–99)
Glucose-Capillary: 124 mg/dL — ABNORMAL HIGH (ref 70–99)
Glucose-Capillary: 132 mg/dL — ABNORMAL HIGH (ref 70–99)
Glucose-Capillary: 139 mg/dL — ABNORMAL HIGH (ref 70–99)

## 2020-04-24 LAB — BASIC METABOLIC PANEL
Anion gap: 11 (ref 5–15)
BUN: 37 mg/dL — ABNORMAL HIGH (ref 8–23)
CO2: 19 mmol/L — ABNORMAL LOW (ref 22–32)
Calcium: 7.7 mg/dL — ABNORMAL LOW (ref 8.9–10.3)
Chloride: 121 mmol/L — ABNORMAL HIGH (ref 98–111)
Creatinine, Ser: 1.77 mg/dL — ABNORMAL HIGH (ref 0.61–1.24)
GFR, Estimated: 38 mL/min — ABNORMAL LOW (ref >60)
Glucose, Bld: 134 mg/dL — ABNORMAL HIGH (ref 70–99)
Potassium: 3.1 mmol/L — ABNORMAL LOW (ref 3.5–5.1)
Sodium: 151 mmol/L — ABNORMAL HIGH (ref 135–145)

## 2020-04-24 LAB — HEPARIN LEVEL (UNFRACTIONATED)
Heparin Unfractionated: 0.24 IU/mL — ABNORMAL LOW (ref 0.30–0.70)
Heparin Unfractionated: 0.28 IU/mL — ABNORMAL LOW (ref 0.30–0.70)

## 2020-04-24 MED ORDER — SODIUM BICARBONATE 650 MG PO TABS
1300.0000 mg | ORAL_TABLET | Freq: Two times a day (BID) | ORAL | Status: DC
  Administered 2020-04-24: 1300 mg

## 2020-04-24 MED ORDER — METOPROLOL TARTRATE 25 MG/10 ML ORAL SUSPENSION
25.0000 mg | Freq: Two times a day (BID) | ORAL | Status: DC
  Administered 2020-04-24: 25 mg
  Filled 2020-04-24: qty 10

## 2020-04-24 MED ORDER — POTASSIUM CHLORIDE 20 MEQ/15ML (10%) PO SOLN: 40.0000 meq | Freq: Two times a day (BID) | ORAL | Status: DC

## 2020-04-24 MED ORDER — ATORVASTATIN CALCIUM 10 MG PO TABS
10.0000 mg | ORAL_TABLET | Freq: Every day | ORAL | Status: DC
  Administered 2020-04-25 – 2020-05-02 (×8): 10 mg via ORAL
  Filled 2020-04-24 (×9): qty 1

## 2020-04-24 MED ORDER — METOPROLOL TARTRATE 25 MG/10 ML ORAL SUSPENSION
25.0000 mg | Freq: Two times a day (BID) | ORAL | Status: DC
  Administered 2020-04-25 – 2020-04-28 (×8): 25 mg via ORAL
  Filled 2020-04-24 (×9): qty 10

## 2020-04-24 MED ORDER — POTASSIUM CHLORIDE 20 MEQ/15ML (10%) PO SOLN
40.0000 meq | Freq: Two times a day (BID) | ORAL | Status: DC
  Administered 2020-04-24: 40 meq
  Filled 2020-04-24: qty 30

## 2020-04-24 MED ORDER — FREE WATER
200.0000 mL | Status: DC
  Administered 2020-04-24: 200 mL

## 2020-04-24 MED ORDER — DEXTROSE 5 % IV SOLN: INTRAVENOUS | Status: DC

## 2020-04-24 MED ORDER — SODIUM BICARBONATE 650 MG PO TABS
1300.0000 mg | ORAL_TABLET | Freq: Two times a day (BID) | ORAL | Status: DC
  Administered 2020-04-25 – 2020-05-02 (×14): 1300 mg via ORAL
  Filled 2020-04-24 (×16): qty 2

## 2020-04-24 MED ORDER — POTASSIUM CHLORIDE 20 MEQ/15ML (10%) PO SOLN
40.0000 meq | Freq: Two times a day (BID) | ORAL | Status: DC
  Filled 2020-04-24: qty 30

## 2020-04-24 MED ORDER — POTASSIUM CHLORIDE 20 MEQ PO PACK
40.0000 meq | PACK | Freq: Two times a day (BID) | ORAL | Status: DC
  Filled 2020-04-24: qty 2

## 2020-04-24 NOTE — Progress Notes (Signed)
Dr. Blossom Hoops called back and discussed heart rate 130-150's afib on amio drip and heparin drip.  Discussed that patient is paranoid and refusing to take any PO meds so he did not get his 2200 dose of metoprolol.  Dr. Blossom Hoops stated "I think we will just let him be tonight and he just needs to be effectively loaded with amio and cardioverted later."  No new orders at this time.

## 2020-04-24 NOTE — Progress Notes (Addendum)
Patient refusing to take medications at this time.  Patient states "this is a whole sham, stop it." Patient alert to self and place but confused to situation and refusing to take any medications. Will not let RN access nor assess his IV.  Patient cussing at this RN and swatting at her. Increased precedex drip. Refusing to wear oxygen. o2 sats maintaining at this time around 94%. Heart rate has been elevated 130-150's afib since shift change and patient refusing to take metoprolol. On amio drip. Will page cardiology on call about heart rate.

## 2020-04-24 NOTE — Progress Notes (Addendum)
Continuing to titrate precedex drip up because patient is becoming increasingly agitated and restless.  Patient asking to call a cab to leave.  This RN and June, RN have both explained to patient that he needs to be in the hospital because he is sick and had surgery and his heart rate is high.  Patient continues to state that there is a sham operation going on and he needs to leave. This RN and June, RN have attempted to reorient patient and discussed with him why he needs to be in the hospital. Continuing to monitor.

## 2020-04-24 NOTE — Progress Notes (Addendum)
NAME:  Carlos Olson, MRN:  [id number], DOB:  1941-01-20, LOS: 7 ADMISSION DATE:  04/17/2020, CONSULTATION DATE: 10/21 REFERRING MD: Dr. Donzetta Matters, CHIEF COMPLAINT: AKI with mild hypoxia  Brief History   79 year old male who presented 10/18 for elective endovascular repair abdominal aortic aneurysm with Dr. Donzetta Matters. PCCM consulted 10/21 for assistance in medical management.  Hospital course complicated by AKI, metabolic encephalopathy, hypoxia/ RLL pneumonia, and hypotension  History of present illness   Carlos Olson is a 79 year old male with a past medical history significant for AAA, history of prostate cancer, heart murmur, diverticulitis, COPD, CAD, and atherosclerosis who presented for elective endovascular repair of abdominal aortic aneurysm with Dr. Donzetta Matters. Overnight postop day 0 patient required low-dose pressor support for mild hypotension, vascular surgery consulted cardiology initially for assistance of hypotension.  Morning of 10/21 patient was seen with worsening creatinine with rise from 1.19-3.10 10/21 and decreasing urine output with approximately 245 mL of urine out last 24 hours. Additionally he was seen with worsening confusion after receiving opioid administration. Pressor support discontinued evening of 12/20. Given worsened AKI and mild hypoxia PCCM consulted for further medical management.  Past Medical History  AAA History of prostate cancer Heart murmur Diverticulitis COPD CAD atherosclerosis  Significant Hospital Events   Admitted 10/18 for elective AAA repair 10/19 OR   Consults:  Cardiology PCCM Nephrology GI  Procedures:  Endovascular AAA repair 10/19  Significant Diagnostic Tests:  04/21/2020 CT a/p >> Status post abdominal aortic repair with aortobifemoral bypass graft. Some stranding is noted in the operative site likely related to postoperative change. No sizable hematoma is seen. Diverticulosis without diverticulitis. Near complete consolidation of  the right lower lobe. Small left pleural effusion is seen with left basilar atelectasis.  10/25 CTH >> 1. Stable.  No acute intracranial abnormality. 2. Atrophy with chronic small vessel white matter ischemic disease.  Micro Data:  none  Antimicrobials:  10/19 cefazolin 10/22 zosyn >>  Interim history/subjective:  tmax 100.7 +2.7L/ net +8L Ongoing Afib w/RVR Remains on precedex 0.5, more alert/ appropriate this morning Up to chair with PT  Objective   Blood pressure 121/71, pulse (!) 120, temperature 97.7 F (36.5 C), resp. rate 18, height 6\' 3"  (1.905 m), weight 94.7 kg, SpO2 95 %.        Intake/Output Summary (Last 24 hours) at 04/24/2020 0929 Last data filed at 04/24/2020 0700 Gross per 24 hour  Intake 4510.21 ml  Output 1975 ml  Net 2535.21 ml   Filed Weights   04/17/20 0603 04/22/20 1507 04/24/20 0453  Weight: 95.3 kg 95.3 kg 94.7 kg   Examination: General:  Frail elderly male sitting in bedside chair- visibly tired after PT  HEENT: MM pink/moist, left NGT Neuro: Awake, f/c, MAE, appropriate  CV: IRIR, afib- rates in the 180's after exerting himself, slowly improving PULM:  Tachypneic, coarse/ diminished breath sounds bilaterally GI: soft, bs+, abd dressings dry, foley  Extremities: warm/dry, no LE edema   Resolved Hospital Problem list     Assessment & Plan:   Acute Kidney Injury with oliguria - baseline sCr ~1 - slowly improving sCr with improving UOP (0.7 ml/kg/hr) Metabolic acidosis Hypernatremia  P: Nephrology following Continue free water  Oral bicarb added  Trending renal indices and strict I/Os Avoid nephrotoxic agents, holding ACE inhibitor  Sepsis, RLL pneumonia, HCAP  MRSA PCR Neg  Leukocytosis - improving Plan: Continue Zosyn, day 5/x Supplemental O2 for sat goal > 92% Continue aggressive pulmonary hygiene  Acute metabolic encephalopathy -Likely  multifactorial including ICU delirium and benzodiazepine utilization in elderly  patient, ICU delirium, and sepsis P: CTH 10/25 with no acute intracranial abnormality; showing atrophy with chronic small vessel white matter ischemic disease. Avoid benzos Wean Precedex as able Continue low-dose Risperdal Avoid opiates if possible. Continue Tylenol for pain   History of COPD not currently in acute exasperation Former smoker History of pulmonary nodules -Primary pulmonologist is Dr. 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 -Postoperatively patient seen with hypotension requiring pressor support this was discontinued 10/21 P: Postop management per vascular surgery Bowel regimen in place  Acute on chronic anemia P: Trending CBC, H/H stable Monitor for bleeding episodes Transfuse for Hgb < 7  Hypokalemia  P: S/p replete per pharmacy  BMET in am   Best practice:  Diet: NPO/ EN Pain/Anxiety/Delirium protocol (if indicated): PRN VAP protocol (if indicated): N/A DVT prophylaxis: Heparin gtt GI prophylaxis: PPI Glucose control: Monitor, remains within goal  Mobility: push PT efforts Code Status: Full Family Communication: wife updated at bedside.   Disposition: ICU  Labs   CBC: Recent Labs  Lab 04/21/20 0850 04/21/20 0850 04/21/20 1620 04/21/20 1655 04/22/20 1023 04/23/20 0202 04/24/20 0439  WBC 16.1*  --  18.0*  --  17.7* 16.2* 14.3*  NEUTROABS  --   --   --   --  15.9*   --   --   HGB 9.3*   < > 9.9* 9.2* 9.3* 8.9* 9.0*  HCT 27.7*   < > 29.7* 27.0* 28.7* 27.3* 27.5*  MCV 98.2  --  98.3  --  97.6 98.6 99.6  PLT 114*  --  127*  --  145* 153 202   < > = values in this interval not displayed.    Basic Metabolic Panel: Recent Labs  Lab 04/18/20 0855 04/18/20 1702 04/19/20 0250 04/19/20 0829 04/20/20 0229 04/20/20 1515 04/21/20 1620 04/21/20 1620 04/21/20 1655 04/21/20 1914 04/22/20 1023 04/23/20 0202 04/23/20 1144 04/24/20 0439  NA 142   < > 143   < > 145   < > 151*   < > 151*  --  153* 156* 154* 151*  K 5.1   < > 5.1   < > 4.7   < > 3.6   < > 3.5  --  3.6 3.3* 3.3* 3.1*  CL 115*   < > 118*   < > 120*   < > 122*  --   --   --  124* 125* 126* 121*  CO2 16*   < > 16*   < > 16*   < > 17*  --   --   --  18* 18* 20* 19*  GLUCOSE 155*   < > 144*   < > 137*   < > 110*  --   --   --  109* 112* 130* 134*  BUN 29*   < > 41*   < > 50*   < > 55*  --   --   --  53* 54* 47* 37*  CREATININE 2.42*   < > 3.10*   < > 2.71*   < > 2.31*  --   --   --  2.10* 2.06* 1.96* 1.77*  CALCIUM 8.1*   < > 8.0*   < > 7.9*   < > 8.4*  --   --   --  8.3* 8.2* 8.2* 7.7*  MG 1.5*  --  2.5*  --  2.5*  --   --   --   --  2.4 2.2  --   --   --   PHOS  --   --   --   --   --   --   --   --   --   --  4.0  --   --   --    < > = values in this interval not displayed.   GFR: Estimated Creatinine Clearance: 39.8 mL/min (A) (by C-G formula based on SCr of 1.77 mg/dL (H)). Recent Labs  Lab 04/20/20 1159 04/20/20 1515 04/20/20 1949 04/20/20 1955 04/21/20 0850 04/21/20 0850 04/21/20 1620 04/22/20 1023 04/23/20 0202 04/24/20 0439  WBC  --    < >  --    < > 16.1*   < > 18.0* 17.7* 16.2* 14.3*  LATICACIDVEN 1.1  --  1.0  --  1.0  --   --   --   --   --    < > = values in this interval not displayed.    Liver Function Tests: Recent Labs  Lab 04/18/20 0855 04/20/20 1955 04/21/20 0850  AST 28 45* 40  ALT 14 21 18   ALKPHOS 34* 48 49  BILITOT 0.5 0.8 0.7  PROT 4.8* 4.6* 4.7*    ALBUMIN 2.9* 2.0* 1.8*   Recent Labs  Lab 04/18/20 0855 04/21/20 1620  LIPASE  --  36  AMYLASE 26*  --    Recent Labs  Lab 04/21/20 1620  AMMONIA 38*    ABG    Component Value Date/Time   PHART 7.435 04/21/2020 1655   PCO2ART 26.8 (L) 04/21/2020 1655   PO2ART 47 (L) 04/21/2020 1655   HCO3 17.9 (L) 04/21/2020 1655   TCO2 19 (L) 04/21/2020 1655   ACIDBASEDEF 5.0 (H) 04/21/2020 1655   O2SAT 83.0 04/21/2020 1655     Coagulation Profile: Recent Labs  Lab 04/17/20 1357  INR 1.2    Cardiac Enzymes: No results for input(s): CKTOTAL, CKMB, CKMBINDEX, TROPONINI in the last 168 hours.  HbA1C: No results found for: HGBA1C  CBG: Recent Labs  Lab 04/23/20 1200 04/23/20 1514 04/24/20 0044 04/24/20 0507 04/24/20 0740  GLUCAP 140* 123* 118* 139* 132*    CCT: 30 mins  Kennieth Rad, ACNP [facility] 04/24/2020, 9:29 AM  See Amion for personal pager PCCM on call pager [phone]   Pulmonary critical care attending:  79 year old gentleman presented on 1018 for elective endovascular repair of abdominal aortic aneurysm.  Postoperatively patient developed delirium, acute renal failure encephalopathy hypoxemic respiratory failure with a right lower lobe pneumonia and hypotension with A. fib RVR.  At this time patient slowly improving.  Tolerated trickle tube feeds.  Urine output improving.  Up with PT OT.  Rate is still remains fast on continuous amiodarone and as needed beta-blockade.  Also now tolerating anticoagulation on heparin infusion.  BP 121/71   Pulse (!) 120   Temp 97.7 F (36.5 C)   Resp 18   Ht 6\' 3"  (1.905  m)   Wt 94.7 kg   SpO2 95%   BMI 26.10 kg/m   General: Elderly male, resting in bed, NG tube, wife at bedside HEENT: Tracking appropriately Heart: Tachycardic, irregularly irregular, S1-S2 Lungs: Clear to auscultation bilaterally, no crackles no wheeze Abdomen: Soft nontender nondistended  Labs: Reviewed Head CT:  Chronic vessel change no acute abnormality. Chest x-ray: right Lower lobe infiltrate, likely some vascular congestion. The patient's images have been independently reviewed by me.    Assessment: Acute hypoxemic respiratory failure, right lower lobe pneumonia Some pulmonary vascular congestion, pulmonary edema. AKI, acute renal failure resolving AAA with endovascular repair, postop care per vascular surgery. Former smoker, history of COPD Acute on chronic systolic diastolic heart failure, prior echo with EF of 51%, grade 1 diastolic dysfunction, history of CAD. ICU delirium, acute metabolic encephalopathy secondary to above  Plan: Continue to follow urine output and BMP Diuresis to maintain euvolemia Continue antibiotics PT OT Up out of bed in chair as much as tolerated Pulmonary hygiene Chest PT, I-S flutter as tolerated. Continue trickle tube feeds May need to have SLP evaluation at some point.  Hard to do with his current NG tube in place.  Would like to give him some time for tolerating trickle tube feeds to help with nutrition. Weaned from Precedex as tolerated Avoid benzos Avoid oversedation with opiates Pain control with Tylenol as tolerated  This patient is critically ill with multiple organ system failure; which, requires frequent high complexity decision making, assessment, support, evaluation, and titration of therapies. This was completed through the application of advanced monitoring technologies and extensive interpretation of multiple databases. During this encounter critical care time was devoted to patient care services described in this note for 33 minutes.  Garner Nash, DO [facility] 04/24/2020 10:39 AM

## 2020-04-24 NOTE — Progress Notes (Signed)
[facility] Progress Note    Assessment/ Plan:   AKI: Likely secondary to acute tubular injury in the context of hypotension, worsening anemia -Baseline creatinine seems to be around 1 -Peak creatinine 3.1 on 10/21,  now 1.77 -Urine output improved    Hypernatremia -worsening yesterday- in the setting of ileus giving  d5 to 150 per hour with improvement-  Now tolerating TF so will give free water given his edema  Metabolic acidosis -related to aki, oral bicarb is ordered   H/o HTN: intermittently hypotensive here, on rate ctrl for afib.   Anemia -Transfuse for Hgb<7 g/dL- added ESA  A. fib.  Currently on amio drip, anticoagulation on hold  Encephalopathy: ICU delirium & benzos, management per primary service. 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: mgmt per vasc surg    Subjective:     Seems a little less alert this AM-  Has a PNA and having some high HR still-  Have started TF however, UOP 1600 and sodium down    Objective:   BP 121/71   Pulse (!) 120   Temp 97.7 F (36.5 C)   Resp 18   Ht 6\' 3"  (1.905 m)   Wt 94.7 kg   SpO2 95%   BMI 26.10 kg/m   Intake/Output Summary (Last 24 hours) at 04/24/2020 0848 Last data filed at 04/24/2020 0700 Gross per 24 hour  Intake 4639.61 ml  Output 1975 ml  Net 2664.61 ml   Weight change: -0.6 kg  Physical Exam: Gen: less alert- mittens on HEENT: dry mucosal membranes QTM:AUQJF irreg Resp: diminished air entry right base, normal wob, bl chest expansion HLK:TGYB Ext: pitting edema to dep areas Neuro: confused  Imaging: CT HEAD WO CONTRAST  Result Date: 04/23/2020 CLINICAL DATA:  Delirium. EXAM: CT HEAD WITHOUT CONTRAST TECHNIQUE: Contiguous axial images were obtained from the base of the skull through the vertex without intravenous contrast. COMPARISON:  01/27/2019 FINDINGS: Brain: There is no evidence for acute  hemorrhage, hydrocephalus, mass lesion, or abnormal extra-axial fluid collection. No definite CT evidence for acute infarction. Diffuse loss of parenchymal volume is consistent with atrophy. Patchy low attenuation in the deep hemispheric and periventricular white matter is nonspecific, but likely reflects chronic microvascular ischemic demyelination. Vascular: No hyperdense vessel or unexpected calcification. Skull: No evidence for fracture. No worrisome lytic or sclerotic lesion. Sinuses/Orbits: The visualized paranasal sinuses and mastoid air cells are clear. Visualized portions of the globes and intraorbital fat are unremarkable. Other: None. IMPRESSION: 1. Stable.  No acute intracranial abnormality. 2. Atrophy with chronic small vessel white matter ischemic disease. Electronically Signed   By: Misty Stanley M.D.   On: 04/23/2020 11:49   DG CHEST PORT 1 VIEW  Result Date: 04/23/2020 CLINICAL DATA:  Pneumonia EXAM: PORTABLE CHEST 1 VIEW COMPARISON:  Five days ago FINDINGS: Increased interstitial coarsening with hazy opacity at the bases with atelectasis or consolidation at the lower lobes on recent abdominal CT. Trace pleural fluid. Normal heart size and stable aortic tortuosity when allowing for rotation. The enteric tube at least reaches the diaphragm. IMPRESSION: 1. New lower lobe opacification with trace pleural fluid, also seen on recent abdominal CT. 2. Vascular congestion. Electronically Signed   By: Monte Fantasia M.D.   On: 04/23/2020 06:44    Labs: BMET Recent Labs  Lab 04/21/20 0236 04/21/20 0236 04/21/20 0850 04/21/20 1620 04/21/20 1655 04/22/20 1023 04/23/20 0202 04/23/20 1144 04/24/20 0439  NA 147*   < >  146* 151* 151* 153* 156* 154* 151*  K 3.8   < > 3.8 3.6 3.5 3.6 3.3* 3.3* 3.1*  CL 118*  --  119* 122*  --  124* 125* 126* 121*  CO2 17*  --  18* 17*  --  18* 18* 20* 19*  GLUCOSE 111*  --  124* 110*  --  109* 112* 130* 134*  BUN 52*  --  53* 55*  --  53* 54* 47* 37*   CREATININE 2.44*  --  2.33* 2.31*  --  2.10* 2.06* 1.96* 1.77*  CALCIUM 8.1*  --  8.1* 8.4*  --  8.3* 8.2* 8.2* 7.7*  PHOS  --   --   --   --   --  4.0  --   --   --    < > = values in this interval not displayed.   CBC Recent Labs  Lab 04/21/20 1620 04/21/20 1620 04/21/20 1655 04/22/20 1023 04/23/20 0202 04/24/20 0439  WBC 18.0*  --   --  17.7* 16.2* 14.3*  NEUTROABS  --   --   --  15.9*  --   --   HGB 9.9*   < > 9.2* 9.3* 8.9* 9.0*  HCT 29.7*   < > 27.0* 28.7* 27.3* 27.5*  MCV 98.3  --   --  97.6 98.6 99.6  PLT 127*  --   --  145* 153 202   < > = values in this interval not displayed.    Medications:    . atorvastatin  10 mg Per Tube QHS  . chlorhexidine  15 mL Mouth Rinse BID  . Chlorhexidine Gluconate Cloth  6 each Topical Daily  . darbepoetin (ARANESP) injection - NON-DIALYSIS  100 mcg Subcutaneous Q Mon-1800  . feeding supplement (OSMOLITE 1.2 CAL)  1,000 mL Per Tube Q24H  . fluticasone furoate-vilanterol  1 puff Inhalation Daily  . lidocaine  2 patch Transdermal Q24H  . mouth rinse  15 mL Mouth Rinse BID  . mouth rinse  15 mL Mouth Rinse q12n4p  . metoprolol tartrate  25 mg Per Tube BID  . pantoprazole (PROTONIX) IV  40 mg Intravenous Q12H  . risperiDONE  0.25 mg Sublingual BID  . sodium bicarbonate  1,300 mg Oral BID      [facility] 04/24/2020, 8:48 AM

## 2020-04-24 NOTE — Progress Notes (Addendum)
[facility]  Subjective  - Alert and answering questions.   Objective 121/71 61 99.8 F (37.7 C) (Axillary) 18 95%  Intake/Output Summary (Last 24 hours) at 04/24/2020 0735 Last data filed at 04/24/2020 0700 Gross per 24 hour  Intake 4769.11 ml  Output 1975 ml  Net 2794.11 ml    Moving all 4 ext, feet warm and well perfused Abdomin soft, incision healing well right groin soft without hematoma Heart   Irregularly irregular Lungs O2 support non labored NG tube in place for nutrition.  Assessment/Planning: AAA, right femoral aneurism  04/17/20 Aorto left iliac right fem bypass   AKI Cr 1.7 improving on Lasix urine OP 1,625 last 24 hours, + fluid overload.  Mental status Delirium improving alert and oriented to place COMPARISON:  01/27/2019  FINDINGS: Brain: There is no evidence for acute hemorrhage, hydrocephalus, mass lesion, or abnormal extra-axial fluid collection. No definite CT evidence for acute infarction. Diffuse loss of parenchymal volume is consistent with atrophy. Patchy low attenuation in the deep hemispheric and periventricular white matter is nonspecific, but likely reflects chronic microvascular ischemic demyelination.  Vascular: No hyperdense vessel or unexpected calcification.  Skull: No evidence for fracture. No worrisome lytic or sclerotic lesion.  Sinuses/Orbits: The visualized paranasal sinuses and mastoid air cells are clear. Visualized portions of the globes and intraorbital fat are unremarkable.  Other: None.  IMPRESSION: 1. Stable.  No acute intracranial abnormality. 2. Atrophy with chronic small vessel white matter ischemic disease.   Afib managed with Amiodarone and BB Acute blood loss on chronic anemia HGB 9.0 s/p 2 units transfused 04/20/2020 stable  Leukocytosis improving HCAP, RLL pneumonia Chest x ray 04/23/2020 FINDINGS: Increased interstitial coarsening with hazy opacity at the  bases with atelectasis or consolidation at the lower lobes on recent abdominal CT. Trace pleural fluid. Normal heart size and stable aortic tortuosity when allowing for rotation. The enteric tube at least reaches the diaphragm.  IMPRESSION: 1. New lower lobe opacification with trace pleural fluid, also seen on recent abdominal CT. 2. Vascular congestion.   Roxy Horseman 04/24/2020 7:35 AM --  Laboratory Lab Results: Recent Labs    04/23/20 0202 04/24/20 0439  WBC 16.2* 14.3*  HGB 8.9* 9.0*  HCT 27.3* 27.5*  PLT 153 202   BMET Recent Labs    04/23/20 1144 04/24/20 0439  NA 154* 151*  K 3.3* 3.1*  CL 126* 121*  CO2 20* 19*  GLUCOSE 130* 134*  BUN 47* 37*  CREATININE 1.96* 1.77*  CALCIUM 8.2* 7.7*    COAG Lab Results  Component Value Date   INR 1.2 04/17/2020   INR 1.0 04/11/2020   INR 0.97 05/07/2017   No results found for: PTT   I have seen and evaluated the patient.  He is postoperative day #7 from open aortic aneurysm repair Much more lucid today.  He is interactive and answering questions appropriately  Neuro: Head CT scan yesterday was negative for acute process.  Mental status is much improved today Pulmonary: Remains on nasal cannula oxygen with good O2 sats.  He has a lower lobe possible pneumonia on CT scan and x-ray.  He is on Zosyn at this time.  His white count is trending down. ID: On Zosyn for possible ischemic colitis and pneumonia. GI: On trickle tube feeds now.  Concerns for ischemic colitis or resolving.  I plan on taking his NG tube out later today. Renal: Urine output remains adequate.  Creatinine trending down. Hypernatremia: Sodium  trending down  Wife updated at bedside.  [name]

## 2020-04-24 NOTE — Progress Notes (Signed)
[place] for heparin Indication: atrial fibrillation  Allergies  Allergen Reactions  . Levaquin [Levofloxacin]     Aortic Aneurysm  . Sulfonamide Derivatives Itching  . Sulfasalazine Itching    Patient Measurements: Height: 6\' 3"  (190.5 cm) Weight: 94.7 kg (208 lb 12.4 oz) IBW/kg (Calculated) : 84.5 Heparin Dosing Weight: 95.3 kg   Vital Signs: Temp: 99.8 F (37.7 C) (10/26 0511) Temp Source: Axillary (10/26 0511) BP: 112/63 (10/26 0300) Pulse Rate: 124 (10/26 0300)  Labs: Recent Labs    04/22/20 1023 04/22/20 1023 04/23/20 0202 04/23/20 0202 04/23/20 1142 04/23/20 1144 04/23/20 2136 04/24/20 0439  HGB 9.3*   < > 8.9*  --   --   --   --  9.0*  HCT 28.7*  --  27.3*  --   --   --   --  27.5*  PLT 145*  --  153  --   --   --   --  202  HEPARINUNFRC  --   --  0.29*   < > 0.23*  --  0.20* 0.24*  CREATININE 2.10*   < > 2.06*  --   --  1.96*  --  1.77*   < > = values in this interval not displayed.    Estimated Creatinine Clearance: 39.8 mL/min (A) (by C-G formula based on SCr of 1.77 mg/dL (H)).   Assessment: 79 y.o. male with Afib for heparin  Goal of Therapy:  Heparin level 0.3-0.7 units/ml Monitor platelets by anticoagulation protocol: Yes   Increase Heparin  1800 units/hr  Phillis Knack, PharmD, BCPS

## 2020-04-24 NOTE — Progress Notes (Signed)
Initial Nutrition Assessment  DOCUMENTATION CODES:   Not applicable  INTERVENTION:    Recommend swallow evaluation with SLP to determine safest diet.  If patient is not safe to take POs, recommend place Cortrak tube for TF (Cortrak service available M-W-F).  NUTRITION DIAGNOSIS:   Increased nutrient needs related to acute illness as evidenced by estimated needs.  Ongoing  GOAL:   Patient will meet greater than or equal to 90% of their needs   Unmet  MONITOR:   Diet advancement, PO intake  REASON FOR ASSESSMENT:   Rounds    ASSESSMENT:   79 yo male admitted 10/18 for elective endovascular repair of AAA. PMH includes AAA, prostate Ca, heart murmur, diverticulitis, COPD, CAD, atherosclerosis, IBS.   Discussed patient in ICU rounds and with RN today.  Trickle tube feeding was initiated via NG tube yesterday: Osmolite 1.2 at 20 ml/h. Patient tolerated trickle TF well and free water flushes added 200 ml every 2 hours.  NG tube accidentally removed by patient earlier today so TF is off.  Remains NPO.  Labs reviewed. Sodium 151, potassium 3.1, BUN 37, Creat 1.77 CBG: [id number]  Medications reviewed and include Aranesp, potassium chloride, sodium bicarb, Precedex.  I/O +8 L since admission. UOP 1625 ml x 24 hours.   Diet Order:   Diet Order            Diet clear liquid Room service appropriate? Yes; Fluid consistency: Thin  Diet effective now                 EDUCATION NEEDS:   No education needs have been identified at this time  Skin:  Skin Assessment: Reviewed RN Assessment (abd & R groin surgical incisions)  Last BM:  10/24  Height:   Ht Readings from Last 1 Encounters:  04/22/20 6\' 3"  (1.905 m)    Weight:   Wt Readings from Last 1 Encounters:  04/24/20 94.7 kg    Ideal Body Weight:  89.1 kg  BMI:  Body mass index is 26.1 kg/m.  Estimated Nutritional Needs:   Kcal:  2200-2400  Protein:  110-130 gm  Fluid:  2.2-2.4  L    Lucas Mallow, RD, LDN, CNSC Please refer to Amion for contact information.

## 2020-04-24 NOTE — Plan of Care (Signed)
  Problem: Education: Goal: Knowledge of General Education information will improve Description: Including pain rating scale, medication(s)/side effects and non-pharmacologic comfort measures Outcome: Progressing   Problem: Health Behavior/Discharge Planning: Goal: Ability to manage health-related needs will improve Outcome: Progressing   Problem: Clinical Measurements: Goal: Ability to maintain clinical measurements within normal limits will improve Outcome: Progressing Goal: Will remain free from infection Outcome: Progressing Goal: Diagnostic test results will improve Outcome: Progressing Goal: Respiratory complications will improve Outcome: Progressing Goal: Cardiovascular complication will be avoided Outcome: Progressing   Problem: Activity: Goal: Risk for activity intolerance will decrease Outcome: Progressing   Problem: Nutrition: Goal: Adequate nutrition will be maintained Outcome: Progressing   Problem: Coping: Goal: Level of anxiety will decrease Outcome: Progressing   Problem: Elimination: Goal: Will not experience complications related to bowel motility Outcome: Progressing Goal: Will not experience complications related to urinary retention Outcome: Progressing   Problem: Pain Managment: Goal: General experience of comfort will improve Outcome: Progressing   Problem: Safety: Goal: Ability to remain free from injury will improve Outcome: Progressing   Problem: Skin Integrity: Goal: Risk for impaired skin integrity will decrease Outcome: Progressing   Problem: Education: Goal: Knowledge of the prescribed therapeutic regimen will improve Outcome: Progressing   Problem: Bowel/Gastric: Goal: Gastrointestinal status for postoperative course will improve Outcome: Progressing   Problem: Cardiac: Goal: Ability to maintain an adequate cardiac output will improve Outcome: Progressing   Problem: Clinical Measurements: Goal: Postoperative complications  will be avoided or minimized Outcome: Progressing   Problem: Respiratory: Goal: Respiratory status will improve Outcome: Progressing   Problem: Skin Integrity: Goal: Demonstration of wound healing without infection will improve Outcome: Progressing   Problem: Urinary Elimination: Goal: Ability to achieve and maintain adequate renal perfusion and functioning will improve Outcome: Progressing   

## 2020-04-24 NOTE — Progress Notes (Signed)
Physical Therapy Treatment Patient Details Name: Carlos Olson MRN: [id number] DOB: 08/17/1940 Today's Date: 04/24/2020    History of Present Illness Pt admit with abdominal aortic aneurysm, bilateral common iliac artery aneurysms, right hypogastric artery aneurysm, right common femoral artery aneurysm.  Underwent aortobifemoral BPG.      PT Comments    Pt admitted with above diagnosis. Pt was able to sit EOB and perform a few exercises. Then pivot to chair with +2 mod to min assist with HR incr to 184 bpm with nurse aware and to give meds as HR did recover to 140's and NP and nurse in room and wanted pt to stay up in chair.  Pt currently with functional limitations due to balance and endurance deficits. Pt will benefit from skilled PT to increase their independence and safety with mobility to allow discharge to the venue listed below.     Follow Up Recommendations  Supervision/Assistance - 24 hour;CIR     Equipment Recommendations  3in1 (PT)    Recommendations for Other Services       Precautions / Restrictions Precautions Precautions: Fall Precaution Comments: NGT Restrictions Weight Bearing Restrictions: No    Mobility  Bed Mobility Overal bed mobility: Needs Assistance Bed Mobility: Rolling;Sidelying to Sit Rolling: Mod assist Sidelying to sit: +2 for physical assistance;Mod assist       General bed mobility comments: Cues for technique and assist, pulled on therapist's hand to roll, assist to raise trunk  Transfers Overall transfer level: Needs assistance Equipment used: Rolling walker (2 wheeled) Transfers: Sit to/from [employer] Transfers Sit to Stand: Min assist;Mod assist;+2 physical assistance Stand pivot transfers: +2 safety/equipment;Min assist       General transfer comment: prefers to not have assistance, but needing min to mod assist to rise and steady and cues for hand placement. Max cues to move LEs to step around to the chair.    Ambulation/Gait                 Stairs             Wheelchair Mobility    Modified Rankin (Stroke Patients Only)       Balance Overall balance assessment: Needs assistance Sitting-balance support: No upper extremity supported;Feet supported Sitting balance-Leahy Scale: Fair     Standing balance support: Bilateral upper extremity supported Standing balance-Leahy Scale: Poor Standing balance comment: reliant on RW and external support                            Cognition Arousal/Alertness: Awake/alert Behavior During Therapy: Flat affect Overall Cognitive Status: Impaired/Different from baseline Area of Impairment: Following commands;Memory;Attention;Problem solving                   Current Attention Level: Sustained Memory: Decreased short-term memory Following Commands: Follows one step commands with increased time     Problem Solving: Slow processing;Decreased initiation;Difficulty sequencing;Requires verbal cues General Comments: pt requires increased time to talk through each movement/activity      Exercises General Exercises - Lower Extremity Ankle Circles/Pumps: AROM;Both;10 reps;Seated Long Arc Quad: AROM;Both;10 reps;Seated    General Comments General comments (skin integrity, edema, etc.): HR to 184 bpm therefore trying to get pt to pivot to chair as quickly as possible.  Nurse aware as he came in room and provided PT with some washclothes as pt had some BM upon standing.  Pt was able to get to chair and HR took incr  time but down to 145 bpm after a few miin in chair. NP came in and happy to see pt up.  Sats initially 95% on 5L.  At end of treatment, hovering at 89-90% therefore NP incr O2 to 6L and sats >90%.  BP 119/62 intiially and then to 140/94 at end of treatment.       Pertinent Vitals/Pain Pain Assessment: Faces Faces Pain Scale: Hurts even more Pain Location: groin bilaterally, abdomen Pain Descriptors /  Indicators: Aching;Grimacing;Guarding Pain Intervention(s): Limited activity within patient's tolerance;Monitored during session;Repositioned    Home Living                      Prior Function            PT Goals (current goals can now be found in the care plan section) Acute Rehab PT Goals Patient Stated Goal: to go home Progress towards PT goals: Not progressing toward goals - comment (Elevated HR )    Frequency    Min 3X/week      PT Plan Discharge plan needs to be updated;Frequency needs to be updated    Co-evaluation              AM-PAC PT "6 Clicks" Mobility   Outcome Measure  Help needed turning from your back to your side while in a flat bed without using bedrails?: A Lot Help needed moving from lying on your back to sitting on the side of a flat bed without using bedrails?: A Lot Help needed moving to and from a bed to a chair (including a wheelchair)?: A Lot Help needed standing up from a chair using your arms (e.g., wheelchair or bedside chair)?: A Lot Help needed to walk in hospital room?: Total Help needed climbing 3-5 steps with a railing? : Total 6 Click Score: 10    End of Session Equipment Utilized During Treatment: Gait belt;Oxygen Activity Tolerance: Patient limited by fatigue Patient left: in chair;with call bell/phone within reach;with family/visitor present;with chair alarm set Nurse Communication: Mobility status PT Visit Diagnosis: Muscle weakness (generalized) (M62.81);Pain Pain - part of body:  (back and groin)     Time: 0930-1000 PT Time Calculation (min) (ACUTE ONLY): 30 min  Charges:  $Therapeutic Exercise: 8-22 mins $Therapeutic Activity: 8-22 mins                     Zacariah Belue W,PT Acute Rehabilitation Services Pager:  [phone]  Office:  [place] 04/24/2020, 11:58 AM

## 2020-04-24 NOTE — Progress Notes (Signed)
Pt is in chair. No Bed CPT done this round.

## 2020-04-24 NOTE — Progress Notes (Signed)
ANTICOAGULATION CONSULT NOTE - Follow Up Consult  Pharmacy Consult for IV Heparin Indication: atrial fibrillation  Allergies  Allergen Reactions  . Levaquin [Levofloxacin]     Aortic Aneurysm  . Sulfonamide Derivatives Itching  . Sulfasalazine Itching    Patient Measurements: Height: 6\' 3"  (190.5 cm) Weight: 94.7 kg (208 lb 12.4 oz) IBW/kg (Calculated) : 84.5 Heparin Dosing Weight: 95.3 kg   Vital Signs: Temp: 99.6 F (37.6 C) (10/26 1536) Temp Source: Oral (10/26 1536) BP: 119/67 (10/26 1502) Pulse Rate: 99 (10/26 1502)  Labs: Recent Labs    04/22/20 1023 04/22/20 1023 04/23/20 0202 04/23/20 1142 04/23/20 1144 04/23/20 2136 04/24/20 0439 04/24/20 1437  HGB 9.3*   < > 8.9*  --   --   --  9.0*  --   HCT 28.7*  --  27.3*  --   --   --  27.5*  --   PLT 145*  --  153  --   --   --  202  --   HEPARINUNFRC  --   --  0.29*   < >  --  0.20* 0.24* 0.28*  CREATININE 2.10*   < > 2.06*  --  1.96*  --  1.77*  --    < > = values in this interval not displayed.    Estimated Creatinine Clearance: 39.8 mL/min (A) (by C-G formula based on SCr of 1.77 mg/dL (H)).   Medical History: Past Medical History:  Diagnosis Date  . AAA (abdominal aortic aneurysm) ([place])   . Atherosclerosis of aortic arch ([place]) 09/25/2015   By CT scan   . CAD (coronary artery disease) 09/25/2015   Mild 3v by CT scan   . Cancer [facility])    prostate  . Colon polyps   . COPD (chronic obstructive pulmonary disease) ([place])   . Diverticulosis of colon   . Femoral bruit    bilateral, found by Dr. Jefm Bryant  . Heart murmur    child  . History of prostate cancer   . IBS (irritable bowel syndrome)    improved after retirement  . 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 Pelvic fracture ([place])   . Pneumonia    child  . Pulmonary nodules 09/25/2015   On screening CT scan, rpt 1 yr   . Shortness of breath    doe    Assessment: 79 yr old male, S/P elective  endovascular repair of abdominal aortic aneurysm on 10/18; has been having AKI (slowly improving) in addition to Afib. Pharmacy was consulted to dose IV heparin; pt was on no anticoagulants PTA. Okay to start anticoagulation without bolus, per teams.  Heparin level ~9 hrs after heparin infusion was increased to 1800 units/hr was 0.28 units/ml, which is just below the goal range for this pt. H/H, platelets stable. Per RN, no issues with IV; pt does have a small amt of bleeding around IV sites (RN monitoring).  Goal of Therapy:  Heparin level 0.3-0.7 units/ml Monitor platelets by anticoagulation protocol: Yes   Plan:  Increase heparin infusion to 1950 units/hr Check heparin level in 8 hrs Monitor daily heparin level, CBC Monitor for signs/symptoms of bleeding  Gillermina Hu, PharmD, BCPS, [facility] Clinical Pharmacist  04/24/2020 3:45 PM

## 2020-04-24 NOTE — Progress Notes (Signed)
Progress Note   Subjective   CT overnight without acute findings. CXR yesterdasy shows new lower lobe opacification. Still has afib with RVR. Metoprolol added yesterday for rate control. BP may tolerate increase. Creatinine improving (1.77, down from 1.96). H/H stable on heparin.  Inpatient Medications    Scheduled Meds: . atorvastatin  10 mg Per Tube QHS  . chlorhexidine  15 mL Mouth Rinse BID  . Chlorhexidine Gluconate Cloth  6 each Topical Daily  . darbepoetin (ARANESP) injection - NON-DIALYSIS  100 mcg Subcutaneous Q Mon-1800  . feeding supplement (OSMOLITE 1.2 CAL)  1,000 mL Per Tube Q24H  . fluticasone furoate-vilanterol  1 puff Inhalation Daily  . lidocaine  2 patch Transdermal Q24H  . mouth rinse  15 mL Mouth Rinse BID  . mouth rinse  15 mL Mouth Rinse q12n4p  . metoprolol tartrate  12.5 mg Per Tube BID  . pantoprazole (PROTONIX) IV  40 mg Intravenous Q12H  . risperiDONE  0.25 mg Sublingual BID  . sodium bicarbonate  1,300 mg Oral BID   Continuous Infusions: . sodium chloride 250 mL (04/23/20 0343)  . amiodarone 30 mg/hr (04/24/20 0816)  . dexmedetomidine (PRECEDEX) IV infusion 0.5 mcg/kg/hr (04/24/20 0700)  . dextrose 30 mL/hr at 04/24/20 0700  . heparin 1,800 Units/hr (04/24/20 0700)  . piperacillin-tazobactam (ZOSYN)  IV 3.375 g (04/24/20 0529)   PRN Meds: acetaminophen **OR** acetaminophen, alum & mag hydroxide-simeth, bisacodyl, guaiFENesin-dextromethorphan, hydrALAZINE, HYDROmorphone (DILAUDID) injection, metoprolol tartrate, ondansetron, phenol, sodium phosphate   Vital Signs    Vitals:   04/24/20 0600 04/24/20 0700 04/24/20 0742 04/24/20 0805  BP: 132/81 121/71    Pulse: (!) 133 61  (!) 120  Resp: (!) 30 18    Temp:   97.7 F (36.5 C)   TempSrc:      SpO2: 96% 95%    Weight:      Height:        Intake/Output Summary (Last 24 hours) at 04/24/2020 6629 Last data filed at 04/24/2020 0700 Gross per 24 hour  Intake 4639.61 ml  Output 1975 ml  Net  2664.61 ml   Filed Weights   04/17/20 0603 04/22/20 1507 04/24/20 0453  Weight: 95.3 kg 95.3 kg 94.7 kg    Telemetry    Afib with RVR - Personally Reviewed  Physical Exam   General appearance: alert and no distress Neck: no carotid bruit, no JVD and thyroid not enlarged, symmetric, no tenderness/mass/nodules Lungs: diminished breath sounds bilaterally Heart: irregularly irregular rhythm and tachycardic Abdomen: large midline incision, ecchymosis Extremities: extremities normal, atraumatic, no cyanosis or edema Pulses: 2+ and symmetric Skin: pale, warm, dry Neurologic: Mental status: sedated, calm today Psych: Cannot assess  Labs    Chemistry Recent Labs  Lab 04/18/20 0855 04/18/20 1702 04/20/20 1955 04/21/20 0236 04/21/20 0850 04/21/20 1620 04/23/20 0202 04/23/20 1144 04/24/20 0439  NA 142   < > 146*   < > 146*   < > 156* 154* 151*  K 5.1   < > 3.9   < > 3.8   < > 3.3* 3.3* 3.1*  CL 115*   < > 118*   < > 119*   < > 125* 126* 121*  CO2 16*   < > 17*   < > 18*   < > 18* 20* 19*  GLUCOSE 155*   < > 123*   < > 124*   < > 112* 130* 134*  BUN 29*   < > 49*   < >  53*   < > 54* 47* 37*  CREATININE 2.42*   < > 2.42*   < > 2.33*   < > 2.06* 1.96* 1.77*  CALCIUM 8.1*   < > 8.2*   < > 8.1*   < > 8.2* 8.2* 7.7*  PROT 4.8*  --  4.6*  --  4.7*  --   --   --   --   ALBUMIN 2.9*  --  2.0*  --  1.8*  --   --   --   --   AST 28  --  45*  --  40  --   --   --   --   ALT 14  --  21  --  18  --   --   --   --   ALKPHOS 34*  --  48  --  49  --   --   --   --   BILITOT 0.5  --  0.8  --  0.7  --   --   --   --   GFRNONAA 24*   < > 26*   < > 28*   < > 32* 34* 38*  ANIONGAP 11   < > 11   < > 9   < > 13 8 11    < > = values in this interval not displayed.     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  Assessment & Plan    1.  afib Rates remain elevated Secondary to acute medical illness / delerium Continue IV amiodarone Continue IV heparin Increase lopressor to 25 BID  2. Acute renal failure Urine output improved (still net positive 8L, +2L yesterday), but felt by nephrology to be hypovolemic on exam Nephrology notes reviewed ACEi on hold Creatinine improving.  3. HTN Stable, bp's improved  4. Delirium Continues - head CT negative for acute process overnight No change required today  5. Thrombocytopenia/ anemia Hemoglobin stable around 9 Transfuse for hb <7 g/dl  6. New infiltrate on CXR, ?aspiration - on Zosyn per PCCM  Discussed findings with wife at the bedside  CRITICAL CARE TIME: I have spent a total of 35 minutes with patient reviewing hospital notes, telemetry, EKGs, labs and examining the patient as well as establishing an assessment and plan that was discussed with the patient. > 50% of time was spent in direct patient care. The patient is critically ill with multi-organ system failure and requires high complexity decision making for assessment and support, frequent evaluation and titration of therapies, application of advanced monitoring technologies and extensive interpretation of multiple databases.  Pixie Casino, MD, [facility], [place] Director of the Advanced Lipid Disorders &  Cardiovascular Risk Reduction Clinic Diplomate of the American Board of Clinical Lipidology Attending Cardiologist  Direct Dial: [phone]  Fax:  [phone]  Website:   [email or web address]  04/24/2020 8:22 AM

## 2020-04-24 NOTE — Progress Notes (Signed)
Called to pt's room by pt's spouse.  Pt removed his NG tube.  Pt found in no distress, no nasal or pharyngeal trauma noted.  Physician had stated earlier that NGT could be removed later today.  Pt has no further needs at this time.

## 2020-04-24 NOTE — Progress Notes (Signed)
Patient requested to call and speak with his wife.  Called wife on room phone and patient spoke with her. Patient does not remember his wife visiting today. Alert to self and place but forgetful with confusion.

## 2020-04-24 NOTE — Progress Notes (Signed)
Re paged Dr. Blossom Hoops again after no call back or orders placed pertaining to elevated heart rate.

## 2020-04-25 DIAGNOSIS — N179 Acute kidney failure, unspecified: Secondary | ICD-10-CM | POA: Diagnosis not present

## 2020-04-25 DIAGNOSIS — I4819 Other persistent atrial fibrillation: Secondary | ICD-10-CM | POA: Diagnosis not present

## 2020-04-25 LAB — BPAM RBC
Blood Product Expiration Date: 202111182359
Blood Product Expiration Date: 202111192359
Blood Product Expiration Date: 202111192359
Blood Product Expiration Date: 202111192359
ISSUE DATE / TIME: 202110190708
ISSUE DATE / TIME: 202110190708
ISSUE DATE / TIME: 202110220904
ISSUE DATE / TIME: 202110220904
Unit Type and Rh: 5100
Unit Type and Rh: 5100
Unit Type and Rh: 5100
Unit Type and Rh: 5100

## 2020-04-25 LAB — TYPE AND SCREEN
ABO/RH(D): O POS
Antibody Screen: NEGATIVE
Unit division: 0
Unit division: 0
Unit division: 0
Unit division: 0

## 2020-04-25 LAB — BASIC METABOLIC PANEL
Anion gap: 11 (ref 5–15)
BUN: 37 mg/dL — ABNORMAL HIGH (ref 8–23)
CO2: 18 mmol/L — ABNORMAL LOW (ref 22–32)
Calcium: 7.7 mg/dL — ABNORMAL LOW (ref 8.9–10.3)
Chloride: 120 mmol/L — ABNORMAL HIGH (ref 98–111)
Creatinine, Ser: 1.87 mg/dL — ABNORMAL HIGH (ref 0.61–1.24)
GFR, Estimated: 36 mL/min — ABNORMAL LOW (ref >60)
Glucose, Bld: 116 mg/dL — ABNORMAL HIGH (ref 70–99)
Potassium: 3 mmol/L — ABNORMAL LOW (ref 3.5–5.1)
Sodium: 149 mmol/L — ABNORMAL HIGH (ref 135–145)

## 2020-04-25 LAB — CBC
HCT: 26.7 % — ABNORMAL LOW (ref 39.0–52.0)
Hemoglobin: 8.6 g/dL — ABNORMAL LOW (ref 13.0–17.0)
MCH: 32 pg (ref 26.0–34.0)
MCHC: 32.2 g/dL (ref 30.0–36.0)
MCV: 99.3 fL (ref 80.0–100.0)
Platelets: 227 10*3/uL (ref 150–400)
RBC: 2.69 MIL/uL — ABNORMAL LOW (ref 4.22–5.81)
RDW: 16.4 % — ABNORMAL HIGH (ref 11.5–15.5)
WBC: 17.1 10*3/uL — ABNORMAL HIGH (ref 4.0–10.5)
nRBC: 0.1 % (ref 0.0–0.2)

## 2020-04-25 LAB — MAGNESIUM: Magnesium: 2 mg/dL (ref 1.7–2.4)

## 2020-04-25 LAB — HEPARIN LEVEL (UNFRACTIONATED): Heparin Unfractionated: 0.34 IU/mL (ref 0.30–0.70)

## 2020-04-25 MED ORDER — ACETAMINOPHEN 160 MG/5ML PO SOLN
325.0000 mg | ORAL | Status: DC | PRN
  Administered 2020-04-26 – 2020-04-27 (×2): 650 mg via ORAL
  Administered 2020-04-28: 325 mg via ORAL
  Administered 2020-04-28: 650 mg via ORAL
  Filled 2020-04-25 (×5): qty 20.3

## 2020-04-25 MED ORDER — POTASSIUM CHLORIDE CRYS ER 20 MEQ PO TBCR
40.0000 meq | EXTENDED_RELEASE_TABLET | Freq: Once | ORAL | Status: AC
  Administered 2020-04-25: 40 meq via ORAL
  Filled 2020-04-25: qty 2

## 2020-04-25 MED ORDER — POTASSIUM CHLORIDE 20 MEQ/15ML (10%) PO SOLN
40.0000 meq | ORAL | Status: DC
  Administered 2020-04-25: 40 meq via ORAL
  Filled 2020-04-25: qty 30

## 2020-04-25 MED ORDER — ALUM & MAG HYDROXIDE-SIMETH 200-200-20 MG/5ML PO SUSP: 15.0000 mL | ORAL | Status: DC | PRN

## 2020-04-25 MED ORDER — BOOST / RESOURCE BREEZE PO LIQD CUSTOM
1.0000 | Freq: Three times a day (TID) | ORAL | Status: DC
  Administered 2020-04-25 – 2020-04-27 (×2): 1 via ORAL

## 2020-04-25 MED ORDER — PANTOPRAZOLE SODIUM 40 MG PO TBEC
40.0000 mg | DELAYED_RELEASE_TABLET | Freq: Two times a day (BID) | ORAL | Status: DC
  Administered 2020-04-25 (×2): 40 mg via ORAL
  Filled 2020-04-25 (×2): qty 1

## 2020-04-25 MED ORDER — GUAIFENESIN-DM 100-10 MG/5ML PO SYRP
15.0000 mL | ORAL_SOLUTION | ORAL | Status: DC | PRN
  Administered 2020-04-27 – 2020-04-28 (×2): 15 mL via ORAL
  Filled 2020-04-25 (×2): qty 15

## 2020-04-25 MED ORDER — AMOXICILLIN-POT CLAVULANATE 875-125 MG PO TABS
1.0000 | ORAL_TABLET | Freq: Two times a day (BID) | ORAL | Status: DC
  Administered 2020-04-25 (×2): 1 via ORAL
  Filled 2020-04-25 (×3): qty 1

## 2020-04-25 MED ORDER — ACETAMINOPHEN 325 MG RE SUPP
325.0000 mg | RECTAL | Status: DC | PRN
  Filled 2020-04-25: qty 2

## 2020-04-25 NOTE — Progress Notes (Addendum)
Progress Note   Subjective   Afib persists - rates are somewhat better between 100-110 at rest, however, noted to shoot up to 160 today with exertion during PT when I was examining the patient.   Inpatient Medications    Scheduled Meds: . atorvastatin  10 mg Oral QHS  . chlorhexidine  15 mL Mouth Rinse BID  . Chlorhexidine Gluconate Cloth  6 each Topical Daily  . darbepoetin (ARANESP) injection - NON-DIALYSIS  100 mcg Subcutaneous Q Mon-1800  . fluticasone furoate-vilanterol  1 puff Inhalation Daily  . lidocaine  2 patch Transdermal Q24H  . mouth rinse  15 mL Mouth Rinse BID  . mouth rinse  15 mL Mouth Rinse q12n4p  . metoprolol tartrate  25 mg Oral BID  . pantoprazole (PROTONIX) IV  40 mg Intravenous Q12H  . potassium chloride  40 mEq Oral BID  . risperiDONE  0.25 mg Sublingual BID  . sodium bicarbonate  1,300 mg Oral BID   Continuous Infusions: . sodium chloride 250 mL (04/23/20 0343)  . amiodarone 30 mg/hr (04/25/20 0830)  . dexmedetomidine (PRECEDEX) IV infusion 0.6 mcg/kg/hr (04/25/20 0830)  . dextrose 75 mL/hr at 04/25/20 0830  . heparin 1,950 Units/hr (04/25/20 0830)  . piperacillin-tazobactam (ZOSYN)  IV 12.5 mL/hr at 04/25/20 0830   PRN Meds: acetaminophen **OR** acetaminophen, alum & mag hydroxide-simeth, bisacodyl, guaiFENesin-dextromethorphan, hydrALAZINE, HYDROmorphone (DILAUDID) injection, ondansetron, phenol, sodium phosphate   Vital Signs    Vitals:   04/25/20 0618 04/25/20 0700 04/25/20 0800 04/25/20 0828  BP:  105/61 109/65   Pulse: 83 69 82   Resp: (!) 31 (!) 29 (!) 26   Temp:      TempSrc:      SpO2: 99% 99% 97% 92%  Weight:      Height:        Intake/Output Summary (Last 24 hours) at 04/25/2020 0917 Last data filed at 04/25/2020 0830 Gross per 24 hour  Intake 3011.59 ml  Output 1120 ml  Net 1891.59 ml   Filed Weights   04/17/20 0603 04/22/20 1507 04/24/20 0453  Weight: 95.3 kg 95.3 kg 94.7 kg    Telemetry    Afib with RVR -  Personally Reviewed  Physical Exam   General appearance: alert and no distress Neck: no carotid bruit, no JVD and thyroid not enlarged, symmetric, no tenderness/mass/nodules Lungs: diminished breath sounds bilaterally Heart: irregularly irregular rhythm and tachycardic Abdomen: large midline incision, ecchymosis Extremities: extremities normal, atraumatic, no cyanosis or edema Pulses: 2+ and symmetric Skin: pale, warm, dry Neurologic: Mental status: sedated, calm today Psych: Cannot assess  Labs    Chemistry Recent Labs  Lab 04/20/20 1955 04/21/20 0236 04/21/20 0850 04/21/20 1620 04/23/20 0202 04/23/20 1144 04/24/20 0439  NA 146*   < > 146*   < > 156* 154* 151*  K 3.9   < > 3.8   < > 3.3* 3.3* 3.1*  CL 118*   < > 119*   < > 125* 126* 121*  CO2 17*   < > 18*   < > 18* 20* 19*  GLUCOSE 123*   < > 124*   < > 112* 130* 134*  BUN 49*   < > 53*   < > 54* 47* 37*  CREATININE 2.42*   < > 2.33*   < > 2.06* 1.96* 1.77*  CALCIUM 8.2*   < > 8.1*   < > 8.2* 8.2* 7.7*  PROT 4.6*  --  4.7*  --   --   --   --  ALBUMIN 2.0*  --  1.8*  --   --   --   --   AST 45*  --  40  --   --   --   --   ALT 21  --  18  --   --   --   --   ALKPHOS 48  --  49  --   --   --   --   BILITOT 0.8  --  0.7  --   --   --   --   GFRNONAA 26*   < > 28*   < > 32* 34* 38*  ANIONGAP 11   < > 9   < > 13 8 11    < > = values in this interval not displayed.     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  Assessment & Plan    1.  afib Rates remain elevated, especially with exertion. Echo showed LVEF 50% with moderate LAE and mild RAE Secondary to acute medical illness / delerium Continue IV amiodarone, lopressor 25 mg BID Continue IV heparin May consider TEE-guided cardioversion attempt tomorrow given persistent issue with rate control of afib. Seems to be tolerating IV heparin without s/s of bleeding - would ultimately transition to [place].  2. Acute renal failure Urine output improved (still net positive 8L, +2L yesterday), but felt by nephrology to be hypovolemic on exam Nephrology notes reviewed ACEi on hold Creatinine improving (1.77 yesterday).  3. HTN Stable, bp's improved  4. Delirium Continues - head CT negative for acute process No change required today  5. Thrombocytopenia/ anemia Hemoglobin stable around 9 Transfuse for hb <7 g/dl Platelets are improving  6. New infiltrate on CXR, ?aspiration - on Zosyn per PCCM, leukocytosis is worse (14->17K today)  Discussed findings with wife at the bedside  TIME SPENT WITH PATIENT: 35 minutes of direct patient care. More than 50% of that time was spent on coordination of care and counseling regarding afib, anticoagulation, AAA repair, AKI.   Pixie Casino, MD, [facility], [place] Director of the Advanced Lipid Disorders &  Cardiovascular Risk Reduction Clinic Diplomate of the American Board of Clinical Lipidology Attending Cardiologist  Direct Dial: [phone]  Fax: [phone]  Website:  [email or web address]  04/25/2020 9:17 AM

## 2020-04-25 NOTE — Progress Notes (Signed)
Initial Nutrition Assessment  DOCUMENTATION CODES:   Not applicable  INTERVENTION:    Add Boost Breeze po TID, each supplement provides 250 kcal and 9 grams of protein   If unable to advance PO diet and/or intake remains poor, recommend place Cortrak tube for TF [employer] service available M-[id number]).  NUTRITION DIAGNOSIS:   Increased nutrient needs related to acute illness as evidenced by estimated needs.  Ongoing  GOAL:   Patient will meet greater than or equal to 90% of their needs   Unmet  MONITOR:   Diet advancement, PO intake  REASON FOR ASSESSMENT:   Rounds    ASSESSMENT:   79 yo male admitted 10/18 for elective endovascular repair of AAA. PMH includes AAA, prostate Ca, heart murmur, diverticulitis, COPD, CAD, atherosclerosis, IBS.   Patient remains agitated today. Diet has been advanced to clear liquids. Will add PO supplement to provide protein and additional calories.  Labs reviewed.  CBG: [id number]  Medications reviewed and include Aranesp, potassium chloride, sodium bicarb, Precedex.  IVF: D5 at 75 ml/h  I/O +10.1 L since admission UOP 980 ml x 24 hours   Diet Order:   Diet Order            Diet clear liquid Room service appropriate? Yes; Fluid consistency: Thin  Diet effective now                 EDUCATION NEEDS:   No education needs have been identified at this time  Skin:  Skin Assessment: Reviewed RN Assessment (abd & R groin surgical incisions)  Last BM:  10/26  Height:   Ht Readings from Last 1 Encounters:  04/22/20 6\' 3"  (1.905 m)    Weight:   Wt Readings from Last 1 Encounters:  04/24/20 94.7 kg    Ideal Body Weight:  89.1 kg  BMI:  Body mass index is 26.1 kg/m.  Estimated Nutritional Needs:   Kcal:  2200-2400  Protein:  110-130 gm  Fluid:  2.2-2.4 L    Lucas Mallow, RD, LDN, CNSC Please refer to Amion for contact information.

## 2020-04-25 NOTE — Progress Notes (Addendum)
NAME:  RENALD Olson, MRN:  [id number], DOB:  01-05-1941, LOS: 8 ADMISSION DATE:  04/17/2020, CONSULTATION DATE: 10/21 REFERRING MD: Carlos Olson, CHIEF COMPLAINT: AKI with mild hypoxia  Brief History   79 year old male who presented 10/18 for elective endovascular repair abdominal aortic aneurysm with Carlos Olson. PCCM consulted 10/21 for assistance in medical management.  Hospital course complicated by AKI, metabolic encephalopathy, hypoxia/ RLL pneumonia, and hypotension  History of present illness   Carlos Olson is a 79 year old male with a past medical history significant for AAA, history of prostate cancer, heart murmur, diverticulitis, COPD, CAD, and atherosclerosis who presented for elective endovascular repair of abdominal aortic aneurysm with Carlos Olson. Overnight postop day 0 patient required low-dose pressor support for mild hypotension, vascular surgery consulted cardiology initially for assistance of hypotension.  Morning of 10/21 patient was seen with worsening creatinine with rise from 1.19-3.10 10/21 and decreasing urine output with approximately 245 mL of urine out last 24 hours. Additionally he was seen with worsening confusion after receiving opioid administration. Pressor support discontinued evening of 12/20. Given worsened AKI and mild hypoxia PCCM consulted for further medical management.  Past Medical History  AAA History of prostate cancer Heart murmur Diverticulitis COPD CAD atherosclerosis  Significant Hospital Events   Admitted 10/18 for elective AAA repair 10/19 OR   Consults:  Cardiology PCCM Nephrology GI  Procedures:  Endovascular AAA repair 10/19  Significant Diagnostic Tests:  04/21/2020 CT a/p >> Status post abdominal aortic repair with aortobifemoral bypass graft. Some stranding is noted in the operative site likely related to postoperative change. No sizable hematoma is seen. Diverticulosis without diverticulitis. Near complete consolidation of  the right lower lobe. Small left pleural effusion is seen with left basilar atelectasis.  10/25 CTH >> 1. Stable.  No acute intracranial abnormality. 2. Atrophy with chronic small vessel white matter ischemic disease.  Micro Data:  none  Antimicrobials:  10/19 cefazolin 10/22 zosyn >>  Interim history/subjective:  Agitated overnight requiring increased Precedex  More alert and coroperative this morning  Remains 10L+, urine output has downtrended   Objective   Blood pressure 105/61, pulse 69, temperature 99.6 F (37.6 C), temperature source Axillary, resp. rate (!) 29, height 6\' 3"  (1.905 m), weight 94.7 kg, SpO2 99 %.        Intake/Output Summary (Last 24 hours) at 04/25/2020 0740 Last data filed at 04/25/2020 0700 Gross per 24 hour  Intake 2945.26 ml  Output 980 ml  Net 1965.26 ml   Filed Weights   04/17/20 0603 04/22/20 1507 04/24/20 0453  Weight: 95.3 kg 95.3 kg 94.7 kg   Examination: General: Chronically ill appearing frail elderly male sitting up in bed in no acute distress HEENT: [place]/AT, MM pink/moist, PERRL,  Neuro: Alert and oriented x2, slight underlying confusion, improved mentation  CV: s1s2 regular rate and rhythm, no murmur, rubs, or gallops,  PULM:  Faint bilateral rhonchi improved with cough, no increased work of breathing GI: soft, bowel sounds active in all 4 quadrants, tender to palpitation, surgical incision C/D/I, non-distended Extremities: warm/dry, no edema  Skin: no rashes or lesions  Resolved Hospital Problem list     Assessment & Plan:   Acute Kidney Injury with oliguria - baseline sCr ~1 - slowly improving sCr with improving UOP (0.7 ml/kg/hr) Metabolic acidosis Hypernatremia  P: Nephrology following, appreciate assistance  Follow renal function / urine output Trend Bmet Avoid nephrotoxins Ensure adequate renal perfusion  Continue bicarb tabs Strict intake and output   Sepsis, RLL  pneumonia, HCAP  MRSA PCR Neg  Leukocytosis -  improving P: Switch to PO Augmentin and complete 7 days Continue supplemental oxygen to for sat goal > 92 Encourage frequent pulmonary hygiene  Frequent IS and flutter valve    Acute metabolic encephalopathy -Likely multifactorial including ICU delirium and benzodiazepine utilization in elderly patient, ICU delirium, and sepsis -[facility] 10/25 with no acute intracranial abnormality; showing atrophy with chronic small vessel white matter ischemic disease. P: Avoid benzos  Wean precedex  Continue low dose risperdal  Tylenol for pain control  Avoid opiates as able  Delirium precautions   History of COPD not currently in acute exasperation Former smoker History of pulmonary nodules -Primary pulmonologist is Dr. 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 -Postoperatively patient seen with hypotension requiring pressor support this was discontinued 10/21 P: Postop management per vascular surgery  Bowel regiment in place   Acute on chronic anemia P: Follow CBC Hgb goal > 7 Transfuse per protocol   Hypokalemia  P: Supplement as needed  Trend Bmet   Best practice:  Diet: NPO/ EN Pain/Anxiety/Delirium protocol (if indicated): PRN VAP protocol (if indicated): N/A DVT prophylaxis: Heparin gtt GI prophylaxis: PPI Glucose control: Monitor, remains within goal  Mobility: push PT efforts Code Status: Full Family Communication:  Daughter updated at bedside. 10/27 Disposition: ICU  Labs   CBC: Recent Labs  Lab 04/21/20 0850 04/21/20 0850 04/21/20 1620 04/21/20 1655 04/22/20 1023 04/23/20 0202 04/24/20 0439  WBC 16.1*  --  18.0*  --  17.7* 16.2* 14.3*  NEUTROABS  --   --   --   --  15.9*  --   --   HGB 9.3*   < > 9.9* 9.2* 9.3* 8.9* 9.0*  HCT 27.7*   < > 29.7* 27.0* 28.7* 27.3* 27.5*  MCV 98.2  --  98.3  --  97.6 98.6 99.6  PLT 114*  --  127*  --  145* 153 202   < > = values in this interval not displayed.    Basic Metabolic Panel: Recent Labs  Lab 04/18/20 0855 04/18/20 1702 04/19/20 0250 04/19/20 0829 04/20/20 0229 04/20/20 1515 04/21/20 1620 04/21/20 1620 04/21/20 1655 04/21/20 1914 04/22/20 1023 04/23/20 0202 04/23/20 1144 04/24/20 0439  NA 142   < > 143   < > 145   < > 151*   < > 151*  --  153* 156* 154* 151*  K 5.1   < > 5.1   < > 4.7   < > 3.6   < > 3.5  --  3.6 3.3* 3.3* 3.1*  CL 115*   < > 118*   < > 120*   < > 122*  --   --   --  124* 125* 126* 121*  CO2 16*   < > 16*   < > 16*   < > 17*  --   --   --  18* 18* 20* 19*  GLUCOSE 155*   < >  144*   < > 137*   < > 110*  --   --   --  109* 112* 130* 134*  BUN 29*   < > 41*   < > 50*   < > 55*  --   --   --  53* 54* 47* 37*  CREATININE 2.42*   < > 3.10*   < > 2.71*   < > 2.31*  --   --   --  2.10* 2.06* 1.96* 1.77*  CALCIUM 8.1*   < > 8.0*   < > 7.9*   < > 8.4*  --   --   --  8.3* 8.2* 8.2* 7.7*  MG 1.5*  --  2.5*  --  2.5*  --   --   --   --  2.4 2.2  --   --   --   PHOS  --   --   --   --   --   --   --   --   --   --  4.0  --   --   --    < > = values in this interval not displayed.   GFR: Estimated Creatinine Clearance: 39.8 mL/min (A) (by C-G formula based on SCr of 1.77 mg/dL (H)). Recent Labs  Lab 04/20/20 1159 04/20/20 1515 04/20/20 1949 04/20/20 1955 04/21/20 0850 04/21/20 0850 04/21/20 1620 04/22/20 1023 04/23/20 0202 04/24/20 0439  WBC  --    < >  --    < > 16.1*   < > 18.0* 17.7* 16.2* 14.3*  LATICACIDVEN 1.1   --  1.0  --  1.0  --   --   --   --   --    < > = values in this interval not displayed.    Liver Function Tests: Recent Labs  Lab 04/18/20 0855 04/20/20 1955 04/21/20 0850  AST 28 45* 40  ALT 14 21 18   ALKPHOS 34* 48 49  BILITOT 0.5 0.8 0.7  PROT 4.8* 4.6* 4.7*  ALBUMIN 2.9* 2.0* 1.8*   Recent Labs  Lab 04/18/20 0855 04/21/20 1620  LIPASE  --  36  AMYLASE 26*  --    Recent Labs  Lab 04/21/20 1620  AMMONIA 38*    ABG    Component Value Date/Time   PHART 7.435 04/21/2020 1655   PCO2ART 26.8 (L) 04/21/2020 1655   PO2ART 47 (L) 04/21/2020 1655   HCO3 17.9 (L) 04/21/2020 1655   TCO2 19 (L) 04/21/2020 1655   ACIDBASEDEF 5.0 (H) 04/21/2020 1655   O2SAT 83.0 04/21/2020 1655     Coagulation Profile: No results for input(s): INR, PROTIME in the last 168 hours.  Cardiac Enzymes: No results for input(s): CKTOTAL, CKMB, CKMBINDEX, TROPONINI in the last 168 hours.  HbA1C: No results found for: HGBA1C  CBG: Recent Labs  Lab 04/24/20 0044 04/24/20 0507 04/24/20 0740 04/24/20 1108 04/24/20 1533  GLUCAP 118* 139* 132* 124* 120*    CRITICAL CARE Performed by: Johnsie Cancel  Total critical care time: 37 minutes  Critical care time was exclusive of separately billable procedures and treating other patients.  Critical care was necessary to treat or prevent imminent or life-threatening deterioration.  Critical care was time spent personally by me on the following activities: development of treatment plan with patient and/or surrogate as well as nursing, discussions with consultants, evaluation of patient's response to treatment, examination of patient, obtaining history from patient or surrogate, ordering and performing treatments  and interventions, ordering and review of laboratory studies, ordering and review of radiographic studies, pulse oximetry and re-evaluation of patient's condition.  Johnsie Cancel, NP-C [facility] Contact /  Pager information can be found on Amion  04/25/2020, 10:22 AM   PCCM attending:  This is an 79 year old gentleman that presented to the hospital on 04/16/2020 for an elective endovascular repair of abdominal aortic aneurysm by Carlos Olson.  Postoperatively the patient developed encephalopathy, ICU delirium hypoxia from a right lower lobe pneumonia and acute renal failure.  Subsequently has been improving.  Able to have NG tube placed and starting tube feeds.  Also having postoperative atrial fibrillation that has been difficult to control.  We appreciate cardiology input.  Bedside discussion this morning with patient's daughter.  Patient is agreeable that he is slowly improving.  Urine output improving, kidney function improving.  Patient tolerating heparin with no evidence of bleeding.  BP (!) 112/58   Pulse (!) 121   Temp 99.6 F (37.6 C) (Axillary)   Resp (!) 27   Ht 6\' 3"  (1.905 m)   Wt 94.7 kg   SpO2 (!) 89%   BMI 26.10 kg/m   General: Elderly male resting comfortably in bed, NG tube in place HEENT: Tracking appropriately Heart: Irregularly irregular, tachycardic, S1-S2 Lungs: Clear to auscultation bilaterally no crackles no wheeze Abdomen: Midline incision, CDI, right groin incision CDI scattered bruising.  Labs: Reviewed Chest x-ray: Repeat chest x-ray today.  Assessment: AAA status post repair AKI, resolving Sepsis, right lower lobe pneumonia, resolving Acute metabolic encephalopathy, resolving, multifactorial etiology secondary to above, ICU related delirium, benzodiazepine use. A. fib with RVR Acute on chronic systolic, diastolic congestive heart failure  Plan: Try to wean off Precedex today Continue low-dose Risperdal Avoid benzodiazepines Possible cardioversion planned for tomorrow Continue IV amiodarone Continue IV heparin Switch antibiotics to Augmentin to complete 7 days. Continue I-S flutter valve Out of bed to chair as much as tolerated Continue to work  with PT Continue advancement of tube feeds.  This patient is critically ill with multiple organ system failure; which, requires frequent high complexity decision making, assessment, support, evaluation, and titration of therapies. This was completed through the application of advanced monitoring technologies and extensive interpretation of multiple databases. During this encounter critical care time was devoted to patient care services described in this note for 32 minutes.  Garner Nash, DO [facility] 04/25/2020 12:53 PM

## 2020-04-25 NOTE — Progress Notes (Signed)
Patient sleeping at this time.  Patient had become slightly labored with his breathing while agitated and when he was refusing to wear oxygen, however he now has non-labored breathing and is compliant with wearing his oxygen in his nose. Heart rate is improving as well since he is more calm.

## 2020-04-25 NOTE — Progress Notes (Signed)
Agitated throughout the night but has slept for the past several hours. On precedex drip.  Cussing at staff and non-compliant with care most of the time.  Increased work of breathing when agitated. Heart rate better controlled when less agitated, unsure if this is due to precedex, less agitation or that metoprolol was given with cough medicine. Refused lab draws around 0040 and have called lab to come back to attempt lab draw since patient has verbally agreed to have labs drawn at this time. Foley leaked therefore irrigated with clear yellow urine returned.

## 2020-04-25 NOTE — Progress Notes (Signed)
Physical Therapy Treatment Patient Details Name: Carlos Olson MRN: [id number] DOB: [birth year] Today's Date: 04/25/2020    History of Present Illness Pt admit with abdominal aortic aneurysm, bilateral common iliac artery aneurysms, right hypogastric artery aneurysm, right common femoral artery aneurysm.  Underwent aortobifemoral BPG.      PT Comments    Pt admitted with above diagnosis. Pt was able to stand and pivot with mod assist for bed mobility and min assist for stand pivot transfers.  Limited by incr HR to 164 bpm with activity. Nurse and MD aware.  May be cardioverted tomorrow.   Pt currently with functional limitations due to balance and endurance deficits. Pt will benefit from skilled PT to increase their independence and safety with mobility to allow discharge to the venue listed below.     Follow Up Recommendations  Supervision/Assistance - 24 hour;CIR     Equipment Recommendations  3in1 (PT)    Recommendations for Other Services       Precautions / Restrictions Precautions Precautions: Fall Precaution Comments: NGT Restrictions Weight Bearing Restrictions: No    Mobility  Bed Mobility Overal bed mobility: Needs Assistance Bed Mobility: Rolling;Sidelying to Sit Rolling: Mod assist Sidelying to sit: +2 for physical assistance;Mod assist       General bed mobility comments: Cues for technique and assist, pulled on therapist's hand to roll, assist to raise trunk  Transfers Overall transfer level: Needs assistance Equipment used: Rolling walker (2 wheeled) Transfers: Sit to/from [employer] Transfers Sit to Stand: Min assist;Mod assist;+2 physical assistance Stand pivot transfers: +2 safety/equipment;Min assist       General transfer comment: prefers to not have assistance, but needing min to mod assist to rise and steady and cues for hand placement. Max cues to move LEs to step around to the chair.   Ambulation/Gait                  Stairs             Wheelchair Mobility    Modified Rankin (Stroke Patients Only)       Balance Overall balance assessment: Needs assistance Sitting-balance support: No upper extremity supported;Feet supported Sitting balance-Leahy Scale: Fair     Standing balance support: Bilateral upper extremity supported Standing balance-Leahy Scale: Poor Standing balance comment: reliant on RW and external support                            Cognition Arousal/Alertness: Awake/alert Behavior During Therapy: Flat affect Overall Cognitive Status: Impaired/Different from baseline Area of Impairment: Following commands;Memory;Attention;Problem solving                   Current Attention Level: Sustained Memory: Decreased short-term memory Following Commands: Follows one step commands with increased time     Problem Solving: Slow processing;Decreased initiation;Difficulty sequencing;Requires verbal cues General Comments: pt requires increased time to talk through each movement/activity      Exercises General Exercises - Lower Extremity Ankle Circles/Pumps: AROM;Both;10 reps;Seated Long Arc Quad: AROM;Both;10 reps;Seated    General Comments General comments (skin integrity, edema, etc.): HR to 164 bpm with activity.  Pt HR 134-148 bpm at end of treatment.  Made nurse aware and MD aware.  Nurse also made aware that left UE swollen as well as catheter leaking urine. Other VSS with pt on 3LO2.        Pertinent Vitals/Pain Pain Assessment: Faces Faces Pain Scale: Hurts even more Pain Location: groin  bilaterally, abdomen Pain Descriptors / Indicators: Aching;Grimacing;Guarding Pain Intervention(s): Limited activity within patient's tolerance;Monitored during session;Repositioned    Home Living                      Prior Function            PT Goals (current goals can now be found in the care plan section) Acute Rehab PT Goals Patient Stated  Goal: to go home Progress towards PT goals: Not progressing toward goals - comment (HR still limiting mobility)    Frequency    Min 3X/week      PT Plan Current plan remains appropriate    Co-evaluation PT/OT/SLP Co-Evaluation/Treatment: Yes Reason for Co-Treatment: Complexity of the patient's impairments (multi-system involvement);For patient/therapist safety          AM-PAC PT "6 Clicks" Mobility   Outcome Measure  Help needed turning from your back to your side while in a flat bed without using bedrails?: A Lot Help needed moving from lying on your back to sitting on the side of a flat bed without using bedrails?: A Lot Help needed moving to and from a bed to a chair (including a wheelchair)?: A Lot Help needed standing up from a chair using your arms (e.g., wheelchair or bedside chair)?: A Lot Help needed to walk in hospital room?: Total Help needed climbing 3-5 steps with a railing? : Total 6 Click Score: 10    End of Session Equipment Utilized During Treatment: Gait belt;Oxygen Activity Tolerance: Patient limited by fatigue Patient left: in chair;with call bell/phone within reach;with family/visitor present;with chair alarm set Nurse Communication: Mobility status PT Visit Diagnosis: Muscle weakness (generalized) (M62.81);Pain Pain - part of body:  (back and groin)     Time: [id number] PT Time Calculation (min) (ACUTE ONLY): 23 min  Charges:  $Therapeutic Activity: 8-22 mins                     Carlos Olson,PT Acute Rehabilitation Services Pager:  [phone]  Office:  [place] 04/25/2020, 10:44 AM

## 2020-04-25 NOTE — Progress Notes (Signed)
Pt limited by elevated HR (to 164 unsustained) with activity. Pt with soreness at incision sites. Requires +2 assist for all mobility, only able to transfer to chair this visit. Max assist for changing gown, total assist for pericare and set up to self feed jello once in chair. Updated d/c recommendation to CIR due to pt's complicated hospital course and limited progress. Will continue to follow.   04/25/20 1059  OT Visit Information  Last OT Received On 04/25/20  Assistance Needed +2  PT/OT/SLP Co-Evaluation/Treatment Yes  Reason for Co-Treatment Complexity of the patient's impairments (multi-system involvement);For patient/therapist safety  OT goals addressed during session ADL's and self-care;Proper use of Adaptive equipment and DME  History of Present Illness Pt admit with abdominal aortic aneurysm, bilateral common iliac artery aneurysms, right hypogastric artery aneurysm, right common femoral artery aneurysm.  Underwent aortobifemoral BPG.    Precautions  Precautions Fall  Precaution Comments 02  Pain Assessment  Pain Assessment Faces  Faces Pain Scale 6  Pain Location groin bilaterally, abdomen  Pain Descriptors / Indicators Aching;Grimacing;Guarding  Pain Intervention(s) Monitored during session;Repositioned  Cognition  Arousal/Alertness Awake/alert  Behavior During Therapy Flat affect  Overall Cognitive Status Impaired/Different from baseline  Area of Impairment Following commands;Memory;Attention;Problem solving  Current Attention Level Sustained  Memory Decreased short-term memory  Following Commands Follows one step commands with increased time  Problem Solving Slow processing;Decreased initiation;Difficulty sequencing;Requires verbal cues  General Comments pt joking, sarcastic  ADL  Overall ADL's  Needs assistance/impaired  Eating/Feeding Set up;Sitting  Eating/Feeding Details (indicate cue type and reason) jello  Upper Body Dressing  Maximal assistance;Sitting  Upper  Body Dressing Details (indicate cue type and reason) changed soiled gown  Toilet Transfer +2 for physical assistance;Minimal assistance;Stand-pivot;RW  Toilet Transfer Details (indicate cue type and reason) simulated to chair  Toileting- Clothing Manipulation and Hygiene Total assistance;+2 for safety/equipment;Sit to/from stand  Bed Mobility  Overal bed mobility Needs Assistance  Bed Mobility Supine to Sit  Supine to sit Mod assist;+2 for physical assistance  General bed mobility comments assisted LEs over EOB, pt pulled up on therapist's hand to raise trunk  Balance  Overall balance assessment Needs assistance  Sitting balance-Leahy Scale Fair  Sitting balance - Comments initially holding onto footboard, but able to sit statically with supervision after several minutes  Standing balance support Bilateral upper extremity supported  Standing balance-Leahy Scale Poor  Standing balance comment reliant on RW and external support  Transfers  Overall transfer level Needs assistance  Equipment used Rolling walker (2 wheeled)  Transfers Sit to/from [employer] Transfers  Sit to Stand Min assist;Mod assist;+2 physical assistance  Stand pivot transfers +2 safety/equipment;Min assist  General transfer comment  Pt allowed to pull up on walker, assist of bed pad to raise hips, assist to steady. Max cues to move LEs to step around to the chair.   OT - End of Session  Equipment Utilized During Treatment Gait belt;Rolling walker;Oxygen (3L)  Activity Tolerance Treatment limited secondary to medical complications (Comment) (elevated HR)  Patient left in chair;with call bell/phone within reach;with family/visitor present;with chair alarm set  Nurse Communication Other (comment) (aware L UE is edematous)  OT Assessment/Plan  OT Plan Discharge plan needs to be updated  OT Visit Diagnosis Pain;Muscle weakness (generalized) (M62.81);Unsteadiness on feet (R26.81);Other abnormalities of gait and  mobility (R26.89);Other symptoms and signs involving cognitive function  OT Frequency (ACUTE ONLY) Min 2X/week  Follow Up Recommendations CIR;Supervision/Assistance - 24 hour  OT Equipment 3 in 1 bedside commode  AM-PAC OT "6 Clicks" Daily Activity Outcome Measure (Version 2)  Help from another person eating meals? 3  Help from another person taking care of personal grooming? 3  Help from another person toileting, which includes using toliet, bedpan, or urinal? 1  Help from another person bathing (including washing, rinsing, drying)? 2  Help from another person to put on and taking off regular upper body clothing? 2  Help from another person to put on and taking off regular lower body clothing? 1  6 Click Score 12  OT Goal Progression  Progress towards OT goals Not progressing toward goals - comment (pt with elevated HR)  Acute Rehab OT Goals  Patient Stated Goal to go home  OT Goal Formulation With patient  Time For Goal Achievement 05/02/20  Potential to Achieve Goals Good  OT Time Calculation  OT Start Time (ACUTE ONLY) 0903  OT Stop Time (ACUTE ONLY) 0926  OT Time Calculation (min) 23 min  OT General Charges  $OT Visit 1 Visit  OT Treatments  $Self Care/Home Management  8-22 mins  Nestor Lewandowsky, OTR/L Acute Rehabilitation Services Pager: [phone] Office: [phone]

## 2020-04-25 NOTE — Progress Notes (Addendum)
Progress Note    04/25/2020 7:36 AM 8 Days Post-Op  Subjective:  Denies pain. Asking for water.  RN notes reviewed.  Vitals:   04/25/20 0618 04/25/20 0700  BP:  105/61  Pulse: 83 69  Resp: (!) 31 (!) 29  Temp:    SpO2: 99% 99%    Physical Exam: General: awakens easily in NAD Cardiac:  RRR Lungs:  No wheezes, rhonchi Incisions:  Midline and right groin incisions healing without signs of infection Extremities:  Feet warm with brisk Doppler signals Abdomen:  Soft, few BS  CBC    Component Value Date/Time   WBC 14.3 (H) 04/24/2020 0439   RBC 2.76 (L) 04/24/2020 0439   HGB 9.0 (L) 04/24/2020 0439   HGB 12.1 (L) 08/20/2012 0610   HCT 27.5 (L) 04/24/2020 0439   HCT 44.0 08/02/2012 0853   PLT 202 04/24/2020 0439   PLT 158 08/20/2012 0610   MCV 99.6 04/24/2020 0439   MCV 99 08/02/2012 0853   MCH 32.6 04/24/2020 0439   MCHC 32.7 04/24/2020 0439   RDW 16.5 (H) 04/24/2020 0439   RDW 13.7 08/02/2012 0853   LYMPHSABS 0.5 (L) 04/22/2020 1023   MONOABS 1.1 (H) 04/22/2020 1023   EOSABS 0.0 04/22/2020 1023   BASOSABS 0.0 04/22/2020 1023    BMET    Component Value Date/Time   NA 151 (H) 04/24/2020 0439   NA 136 08/20/2012 0610   K 3.1 (L) 04/24/2020 0439   K 4.2 08/20/2012 0610   CL 121 (H) 04/24/2020 0439   CL 102 08/20/2012 0610   CO2 19 (L) 04/24/2020 0439   CO2 26 08/20/2012 0610   GLUCOSE 134 (H) 04/24/2020 0439   GLUCOSE 123 (H) 08/20/2012 0610   BUN 37 (H) 04/24/2020 0439   BUN 7 08/20/2012 0610   CREATININE 1.77 (H) 04/24/2020 0439   CREATININE 0.75 08/20/2012 0610   CALCIUM 7.7 (L) 04/24/2020 0439   CALCIUM 8.4 (L) 08/20/2012 0610   GFRNONAA 38 (L) 04/24/2020 0439   GFRNONAA >60 08/20/2012 0610   GFRAA >60 05/07/2017 0949   GFRAA >60 08/20/2012 0610     Intake/Output Summary (Last 24 hours) at 04/25/2020 0736 Last data filed at 04/25/2020 0700 Gross per 24 hour  Intake 2945.26 ml  Output 980 ml  Net 1965.26 ml    HOSPITAL  MEDICATIONS Scheduled Meds: . atorvastatin  10 mg Oral QHS  . chlorhexidine  15 mL Mouth Rinse BID  . Chlorhexidine Gluconate Cloth  6 each Topical Daily  . darbepoetin (ARANESP) injection - NON-DIALYSIS  100 mcg Subcutaneous Q Mon-1800  . fluticasone furoate-vilanterol  1 puff Inhalation Daily  . lidocaine  2 patch Transdermal Q24H  . mouth rinse  15 mL Mouth Rinse BID  . mouth rinse  15 mL Mouth Rinse q12n4p  . metoprolol tartrate  25 mg Oral BID  . pantoprazole (PROTONIX) IV  40 mg Intravenous Q12H  . potassium chloride  40 mEq Oral BID  . risperiDONE  0.25 mg Sublingual BID  . sodium bicarbonate  1,300 mg Oral BID   Continuous Infusions: . sodium chloride 250 mL (04/23/20 0343)  . amiodarone 30 mg/hr (04/25/20 0700)  . dexmedetomidine (PRECEDEX) IV infusion 1.2 mcg/kg/hr (04/25/20 0700)  . dextrose 75 mL/hr at 04/25/20 0700  . heparin 1,950 Units/hr (04/25/20 0731)  . piperacillin-tazobactam (ZOSYN)  IV 12.5 mL/hr at 04/25/20 0700   PRN Meds:.acetaminophen **OR** acetaminophen, alum & mag hydroxide-simeth, bisacodyl, guaiFENesin-dextromethorphan, hydrALAZINE, HYDROmorphone (DILAUDID) injection, ondansetron, phenol, sodium phosphate  Assessment:  POD 8 aortobifemoral bypass. LE well perfused. Tolerating liquid diet. zPOst-op course complicated by: A.fib with RVR: rate currently 80s-90s. On amiodarone infusion AKI: Scr continues downward trend; UOP = [id number] metabolic encephalopathy/confusion/delirium: co-operative this morning RLL pneumonia: SaO2 90s on HFNC. Tm 100.7 at midnight. On Zosyn. WBC continues to improve  Plan: -Making improvement overall.  Continue medical management. Appreciate Ccm and cardiology management -DVT prophylaxis:  Heparin infusion   Risa Grill, PA-C [facility] [phone] 04/25/2020  7:36 AM   I agree with the above.  I have seen and evaluated the patient.  His daughter was present at the bedside.   Neuro: Mental status  continues to improve.  Narcotics minimized Pulmonary: Remains on nasal cannula oxygen with good O2 sats.    White blood cell count is trending up.  He remains on Zosyn.  He may need to be recultured.  I have encouraged incentive spirometry. ID: On Zosyn for possible ischemic colitis and pneumonia. GI: Patient pulled NG tube out yesterday.  He tried Jell-O last night.  We can advance his diet Renal: Urine output remains adequate.  Creatinine bumped up slightly today.  We will continue to monitor Hypernatremia: Sodium trending down  A. fib: Now on heparin drip.  Cardiology considering cardioversion  Wells Sandie Swayze

## 2020-04-25 NOTE — Progress Notes (Signed)
ANTICOAGULATION CONSULT NOTE - Follow Up Consult  Pharmacy Consult for IV Heparin Indication: atrial fibrillation  Allergies  Allergen Reactions  . Levaquin [Levofloxacin]     Aortic Aneurysm  . Sulfonamide Derivatives Itching  . Sulfasalazine Itching    Patient Measurements: Height: 6\' 3"  (190.5 cm) Weight: 94.7 kg (208 lb 12.4 oz) IBW/kg (Calculated) : 84.5 Heparin Dosing Weight: 95.3 kg   Vital Signs: Temp: 99.6 F (37.6 C) (10/27 0400) Temp Source: Axillary (10/27 0400) BP: 109/65 (10/27 0800) Pulse Rate: 82 (10/27 0800)  Labs: Recent Labs    04/23/20 0202 04/23/20 1142 04/23/20 1144 04/23/20 2136 04/24/20 0439 04/24/20 1437 04/25/20 0803  HGB 8.9*  --   --   --  9.0*  --  8.6*  HCT 27.3*  --   --   --  27.5*  --  26.7*  PLT 153  --   --   --  202  --  227  HEPARINUNFRC 0.29*   < >  --    < > 0.24* 0.28* 0.34  CREATININE 2.06*  --  1.96*  --  1.77*  --   --    < > = values in this interval not displayed.    Estimated Creatinine Clearance: 39.8 mL/min (A) (by C-G formula based on SCr of 1.77 mg/dL (H)).   Medical History: Past Medical History:  Diagnosis Date  . AAA (abdominal aortic aneurysm) ([place])   . Atherosclerosis of aortic arch ([place]) 09/25/2015   By CT scan   . CAD (coronary artery disease) 09/25/2015   Mild 3v by CT scan   . Cancer [facility])    prostate  . Colon polyps   . COPD (chronic obstructive pulmonary disease) ([place])   . Diverticulosis of colon   . Femoral bruit    bilateral, found by Dr. Jefm Bryant  . Heart murmur    child  . History of prostate cancer   . IBS (irritable bowel syndrome)    improved after retirement  . 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 Pelvic fracture ([place])   . Pneumonia    child  . Pulmonary nodules 09/25/2015   On screening CT scan, rpt 1 yr   . Shortness of breath    doe    Assessment: 79 yr old male, S/P elective endovascular repair of abdominal aortic aneurysm on  10/18; has been having AKI (slowly improving) in addition to Afib. Pharmacy was consulted to dose IV heparin; pt was not on anticoagulants PTA.  Heparin level is therapeutic at 0.34 on 1950 units/hr. H/H, platelets stable. Per RN, no issues with IV this AM.   Goal of Therapy:  Heparin level 0.3-0.7 units/ml Monitor platelets by anticoagulation protocol: Yes   Plan:  Continue heparin infusion at 1950 units/hr Monitor daily heparin level, CBC Monitor for signs/symptoms of bleeding  Romilda Garret, PharmD PGY1 Acute Care Pharmacy Resident Phone: [phone] 04/25/2020 9:10 AM  Please check [email or web address] for unit specific pharmacy phone numbers.

## 2020-04-25 NOTE — Progress Notes (Signed)
Patient refused to have labs drawn.  Prior to lab tech entering room patient was sleeping and much more calm. RN asked lab tech to come back in the morning to attempt lab draw if patient will allow.

## 2020-04-25 NOTE — Progress Notes (Signed)
[facility] Progress Note    Assessment/ Plan:   AKI: Likely secondary to acute tubular injury in the context of hypotension, worsening anemia -Baseline creatinine seems to be around 1 -Peak creatinine 3.1 on 10/21,  was 1.77 Assuming is still improving-  Hopefully the recurrent Afib is not causing a perfusion problem   Hypernatremia -was trending better-   Then had disruption in his free water/d5-  Have resumed some   Metabolic acidosis -related to aki, oral bicarb is ordered   H/o HTN: intermittently hypotensive here, on rate ctrl for afib.   Anemia -Transfuse for Hgb<7 g/dL- added ESA  A. fib.  Currently on amio drip, anticoagulation on hold  Encephalopathy: ICU delirium & benzos, management per primary service. 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: mgmt per vasc surg    Subjective:     Agitated, confused and also issues with rapid a fib.  Refused labs this AM so no current data.   He pulled his NGT so I resumed some d5water- had 980 of UOP-  Ended up positive 1900    Objective:   BP 109/65   Pulse 82   Temp 99.6 F (37.6 C) (Axillary)   Resp (!) 26   Ht 6\' 3"  (1.905 m)   Wt 94.7 kg   SpO2 97%   BMI 26.10 kg/m   Intake/Output Summary (Last 24 hours) at 04/25/2020 4696 Last data filed at 04/25/2020 0700 Gross per 24 hour  Intake 2858.78 ml  Output 980 ml  Net 1878.78 ml   Weight change:   Physical Exam: Gen: less alert- mittens on HEENT: dry mucosal membranes EXB:MWUXL irreg Resp: diminished air entry right base, normal wob, bl chest expansion KGM:WNUU Ext: pitting edema to dep areas Neuro: confused  Imaging: CT HEAD WO CONTRAST  Result Date: 04/23/2020 CLINICAL DATA:  Delirium. EXAM: CT HEAD WITHOUT CONTRAST TECHNIQUE: Contiguous axial images were obtained from the base of the skull through the vertex without intravenous contrast. COMPARISON:   01/27/2019 FINDINGS: Brain: There is no evidence for acute hemorrhage, hydrocephalus, mass lesion, or abnormal extra-axial fluid collection. No definite CT evidence for acute infarction. Diffuse loss of parenchymal volume is consistent with atrophy. Patchy low attenuation in the deep hemispheric and periventricular white matter is nonspecific, but likely reflects chronic microvascular ischemic demyelination. Vascular: No hyperdense vessel or unexpected calcification. Skull: No evidence for fracture. No worrisome lytic or sclerotic lesion. Sinuses/Orbits: The visualized paranasal sinuses and mastoid air cells are clear. Visualized portions of the globes and intraorbital fat are unremarkable. Other: None. IMPRESSION: 1. Stable.  No acute intracranial abnormality. 2. Atrophy with chronic small vessel white matter ischemic disease. Electronically Signed   By: Misty Stanley M.D.   On: 04/23/2020 11:49    Labs: BMET Recent Labs  Lab 04/21/20 0236 04/21/20 0236 04/21/20 0850 04/21/20 1620 04/21/20 1655 04/22/20 1023 04/23/20 0202 04/23/20 1144 04/24/20 0439  NA 147*   < > 146* 151* 151* 153* 156* 154* 151*  K 3.8   < > 3.8 3.6 3.5 3.6 3.3* 3.3* 3.1*  CL 118*  --  119* 122*  --  124* 125* 126* 121*  CO2 17*  --  18* 17*  --  18* 18* 20* 19*  GLUCOSE 111*  --  124* 110*  --  109* 112* 130* 134*  BUN 52*  --  53* 55*  --  53* 54* 47* 37*  CREATININE 2.44*  --  2.33* 2.31*  --  2.10* 2.06* 1.96* 1.77*  CALCIUM 8.1*  --  8.1* 8.4*  --  8.3* 8.2* 8.2* 7.7*  PHOS  --   --   --   --   --  4.0  --   --   --    < > = values in this interval not displayed.   CBC Recent Labs  Lab 04/21/20 1620 04/21/20 1620 04/21/20 1655 04/22/20 1023 04/23/20 0202 04/24/20 0439  WBC 18.0*  --   --  17.7* 16.2* 14.3*  NEUTROABS  --   --   --  15.9*  --   --   HGB 9.9*   < > 9.2* 9.3* 8.9* 9.0*  HCT 29.7*   < > 27.0* 28.7* 27.3* 27.5*  MCV 98.3  --   --  97.6 98.6 99.6  PLT 127*  --   --  145* 153 202   < > =  values in this interval not displayed.    Medications:    . atorvastatin  10 mg Oral QHS  . chlorhexidine  15 mL Mouth Rinse BID  . Chlorhexidine Gluconate Cloth  6 each Topical Daily  . darbepoetin (ARANESP) injection - NON-DIALYSIS  100 mcg Subcutaneous Q Mon-1800  . fluticasone furoate-vilanterol  1 puff Inhalation Daily  . lidocaine  2 patch Transdermal Q24H  . mouth rinse  15 mL Mouth Rinse BID  . mouth rinse  15 mL Mouth Rinse q12n4p  . metoprolol tartrate  25 mg Oral BID  . pantoprazole (PROTONIX) IV  40 mg Intravenous Q12H  . potassium chloride  40 mEq Oral BID  . risperiDONE  0.25 mg Sublingual BID  . sodium bicarbonate  1,300 mg Oral BID      [facility] 04/25/2020, 8:32 AM

## 2020-04-26 ENCOUNTER — Inpatient Hospital Stay (HOSPITAL_COMMUNITY): Payer: Medicare HMO

## 2020-04-26 ENCOUNTER — Encounter (HOSPITAL_COMMUNITY): Payer: Self-pay | Admitting: Vascular Surgery

## 2020-04-26 ENCOUNTER — Encounter (HOSPITAL_COMMUNITY)
Admission: RE | Disposition: A | Discharge status: Rehabilitation Facility | Payer: Self-pay | Source: Home / Self Care | Attending: Vascular Surgery

## 2020-04-26 ENCOUNTER — Inpatient Hospital Stay (HOSPITAL_COMMUNITY): Payer: Medicare HMO | Admitting: Certified Registered Nurse Anesthetist

## 2020-04-26 DIAGNOSIS — I4819 Other persistent atrial fibrillation: Secondary | ICD-10-CM | POA: Diagnosis not present

## 2020-04-26 DIAGNOSIS — N179 Acute kidney failure, unspecified: Secondary | ICD-10-CM | POA: Diagnosis not present

## 2020-04-26 DIAGNOSIS — I714 Abdominal aortic aneurysm, without rupture: Secondary | ICD-10-CM | POA: Diagnosis not present

## 2020-04-26 DIAGNOSIS — I4891 Unspecified atrial fibrillation: Secondary | ICD-10-CM | POA: Diagnosis not present

## 2020-04-26 HISTORY — PX: CARDIOVERSION: SHX1299

## 2020-04-26 HISTORY — PX: BUBBLE STUDY: SHX6837

## 2020-04-26 HISTORY — PX: TEE WITHOUT CARDIOVERSION: SHX5443

## 2020-04-26 LAB — CBC
HCT: 24.7 % — ABNORMAL LOW (ref 39.0–52.0)
Hemoglobin: 7.9 g/dL — ABNORMAL LOW (ref 13.0–17.0)
MCH: 32 pg (ref 26.0–34.0)
MCHC: 32 g/dL (ref 30.0–36.0)
MCV: 100 fL (ref 80.0–100.0)
Platelets: 233 10*3/uL (ref 150–400)
RBC: 2.47 MIL/uL — ABNORMAL LOW (ref 4.22–5.81)
RDW: 16.4 % — ABNORMAL HIGH (ref 11.5–15.5)
WBC: 18.2 10*3/uL — ABNORMAL HIGH (ref 4.0–10.5)
nRBC: 0.2 % (ref 0.0–0.2)

## 2020-04-26 LAB — BASIC METABOLIC PANEL
Anion gap: 11 (ref 5–15)
BUN: 38 mg/dL — ABNORMAL HIGH (ref 8–23)
CO2: 17 mmol/L — ABNORMAL LOW (ref 22–32)
Calcium: 7.7 mg/dL — ABNORMAL LOW (ref 8.9–10.3)
Chloride: 119 mmol/L — ABNORMAL HIGH (ref 98–111)
Creatinine, Ser: 2.24 mg/dL — ABNORMAL HIGH (ref 0.61–1.24)
GFR, Estimated: 29 mL/min — ABNORMAL LOW (ref >60)
Glucose, Bld: 115 mg/dL — ABNORMAL HIGH (ref 70–99)
Potassium: 3.6 mmol/L (ref 3.5–5.1)
Sodium: 147 mmol/L — ABNORMAL HIGH (ref 135–145)

## 2020-04-26 LAB — EXPECTORATED SPUTUM ASSESSMENT W GRAM STAIN, RFLX TO RESP C

## 2020-04-26 LAB — PROTIME-INR
INR: 1.5 — ABNORMAL HIGH (ref 0.8–1.2)
Prothrombin Time: 17.3 seconds — ABNORMAL HIGH (ref 11.4–15.2)

## 2020-04-26 LAB — HEPARIN LEVEL (UNFRACTIONATED)
Heparin Unfractionated: 0.27 IU/mL — ABNORMAL LOW (ref 0.30–0.70)
Heparin Unfractionated: 0.35 IU/mL (ref 0.30–0.70)

## 2020-04-26 SURGERY — CARDIOVERSION
Anesthesia: General

## 2020-04-26 MED ORDER — PROPOFOL 10 MG/ML IV BOLUS
INTRAVENOUS | Status: DC | PRN
  Administered 2020-04-26: 20 mg via INTRAVENOUS

## 2020-04-26 MED ORDER — LIDOCAINE VISCOUS HCL 2 % MT SOLN
OROMUCOSAL | Status: DC | PRN
  Administered 2020-04-26: 15 mL via OROMUCOSAL

## 2020-04-26 MED ORDER — PANTOPRAZOLE SODIUM 40 MG IV SOLR
40.0000 mg | INTRAVENOUS | Status: DC
  Administered 2020-04-26 – 2020-04-27 (×2): 40 mg via INTRAVENOUS
  Filled 2020-04-26 (×2): qty 40

## 2020-04-26 MED ORDER — POTASSIUM CHLORIDE 10 MEQ/100ML IV SOLN
10.0000 meq | INTRAVENOUS | Status: AC
  Administered 2020-04-26 (×2): 10 meq via INTRAVENOUS

## 2020-04-26 MED ORDER — POTASSIUM CHLORIDE CRYS ER 20 MEQ PO TBCR: 40.0000 meq | EXTENDED_RELEASE_TABLET | Freq: Once | ORAL | Status: DC

## 2020-04-26 MED ORDER — VASOPRESSIN 20 UNIT/ML IV SOLN
INTRAVENOUS | Status: DC | PRN
  Administered 2020-04-26 (×3): 1 [IU] via INTRAVENOUS

## 2020-04-26 MED ORDER — SODIUM CHLORIDE 0.9 % IV SOLN: INTRAVENOUS | Status: DC

## 2020-04-26 MED ORDER — POTASSIUM CHLORIDE 10 MEQ/100ML IV SOLN
10.0000 meq | INTRAVENOUS | Status: AC
  Administered 2020-04-26 (×2): 10 meq via INTRAVENOUS
  Filled 2020-04-26: qty 100

## 2020-04-26 MED ORDER — PHENYLEPHRINE HCL-NACL 10-0.9 MG/250ML-% IV SOLN
INTRAVENOUS | Status: DC | PRN
  Administered 2020-04-26: 40 ug/min via INTRAVENOUS

## 2020-04-26 MED ORDER — PROPOFOL 500 MG/50ML IV EMUL
INTRAVENOUS | Status: DC | PRN
  Administered 2020-04-26: 150 ug/kg/min via INTRAVENOUS

## 2020-04-26 MED ORDER — PHENYLEPHRINE 40 MCG/ML (10ML) SYRINGE FOR IV PUSH (FOR BLOOD PRESSURE SUPPORT)
PREFILLED_SYRINGE | INTRAVENOUS | Status: DC | PRN
  Administered 2020-04-26: 400 ug via INTRAVENOUS

## 2020-04-26 MED ORDER — GERHARDT'S BUTT CREAM
TOPICAL_CREAM | CUTANEOUS | Status: DC | PRN
  Administered 2020-05-25 – 2020-05-29 (×2): 1 via TOPICAL
  Filled 2020-04-26 (×9): qty 1

## 2020-04-26 NOTE — Progress Notes (Signed)
ANTICOAGULATION CONSULT NOTE - Follow Up Consult  Pharmacy Consult for IV Heparin Indication: atrial fibrillation  Allergies  Allergen Reactions  . Levaquin [Levofloxacin]     Aortic Aneurysm  . Sulfonamide Derivatives Itching  . Sulfasalazine Itching    Patient Measurements: Height: 6\' 3"  (190.5 cm) Weight: 94.7 kg (208 lb 12.4 oz) IBW/kg (Calculated) : 84.5 Heparin Dosing Weight: 95.3 kg   Vital Signs: Temp: 98.2 F (36.8 C) (10/28 1104) Temp Source: Oral (10/28 1104) BP: 125/Carlos (10/28 1104) Pulse Rate: 138 (10/28 1104)  Labs: Recent Labs    04/24/20 0439 04/24/20 1437 04/25/20 0803 04/26/20 0037 04/26/20 1020  HGB 9.0*  --  8.6* 7.9*  --   HCT 27.5*  --  26.7* 24.7*  --   PLT 202  --  227 233  --   LABPROT  --   --   --   --  17.3*  INR  --   --   --   --  1.5*  HEPARINUNFRC 0.24*   < > 0.34 0.27* 0.35  CREATININE 1.77*  --  1.87* 2.24*  --    < > = values in this interval not displayed.    Estimated Creatinine Clearance: 31.4 mL/min (A) (by C-G formula based on SCr of 2.24 mg/dL (H)).   Medical History: Past Medical History:  Diagnosis Date  . AAA (abdominal aortic aneurysm) ([place])   . Atherosclerosis of aortic arch ([place]) 09/25/2015   By CT scan   . CAD (coronary artery disease) 09/25/2015   Mild 3v by CT scan   . Cancer [facility])    prostate  . Colon polyps   . COPD (chronic obstructive pulmonary disease) ([place])   . Diverticulosis of colon   . Femoral bruit    bilateral, found by Dr. Jefm Bryant  . Heart murmur    child  . History of prostate cancer   . IBS (irritable bowel syndrome)    improved after retirement  . 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 Pelvic fracture ([place])   . Pneumonia    child  . Pulmonary nodules 09/25/2015   On screening CT scan, rpt 1 yr   . Shortness of breath    doe    Assessment: 79 yr old [place], S/P elective endovascular repair of abdominal aortic aneurysm on 10/18; has been  having AKI (slowly improving) in addition to Afib. Pharmacy was consulted to dose IV heparin; pt was not on anticoagulants PTA.  Heparin level therapeutic at 0.35 on gtt at 2050 units/hr. Hgb down to 7.9 but no bleeding noted per RN.  Goal of Therapy:  Heparin level 0.3-0.7 units/ml Monitor platelets by anticoagulation protocol: Yes   Plan:  Continue heparin infusion at 2050 units/hr Daily HL, CBC Monitor s/s bleeding  Romilda Garret, PharmD PGY1 Acute Care Pharmacy Resident Phone: [phone] 04/26/2020 11:32 AM  Please check [email or web address] for unit specific pharmacy phone numbers.

## 2020-04-26 NOTE — Anesthesia Preprocedure Evaluation (Addendum)
Anesthesia Evaluation  Patient identified by MRN, date of birth, ID band Patient awake    Reviewed: Allergy & Precautions, NPO status , Patient's Chart, lab work & pertinent test results  Airway Mallampati: III       Dental  (+) Edentulous Upper, Edentulous Lower   Pulmonary COPD, former smoker,    Pulmonary exam normal        Cardiovascular + CAD  + dysrhythmias Atrial Fibrillation  Rhythm:Irregular Rate:Tachycardia     Neuro/Psych negative neurological ROS  negative psych ROS   GI/Hepatic Neg liver ROS, IBS (irritable bowel syndrome)   Endo/Other  negative endocrine ROS  Renal/GU CRFRenal disease     Musculoskeletal  (+) Arthritis ,   Abdominal   Peds  Hematology  (+) anemia ,   Anesthesia Other Findings A-FIB with RVR  Reproductive/Obstetrics                            Anesthesia Physical Anesthesia Plan  ASA: IV  Anesthesia Plan: General   Post-op Pain Management:    Induction: Intravenous  PONV Risk Score and Plan: 2 and Propofol infusion and Treatment may vary due to age or medical condition  Airway Management Planned: Nasal Cannula  Additional Equipment:   Intra-op Plan:   Post-operative Plan:   Informed Consent: I have reviewed the patients History and Physical, chart, labs and discussed the procedure including the risks, benefits and alternatives for the proposed anesthesia with the patient or authorized representative who has indicated his/her understanding and acceptance.       Plan Discussed with: CRNA  Anesthesia Plan Comments:        Anesthesia Quick Evaluation

## 2020-04-26 NOTE — Plan of Care (Signed)
  Problem: Education: Goal: Knowledge of General Education information will improve Description: Including pain rating scale, medication(s)/side effects and non-pharmacologic comfort measures Outcome: Progressing   Problem: Clinical Measurements: Goal: Ability to maintain clinical measurements within normal limits will improve Outcome: Progressing Goal: Will remain free from infection Outcome: Progressing Goal: Respiratory complications will improve Outcome: Progressing Goal: Cardiovascular complication will be avoided Outcome: Progressing   Problem: Activity: Goal: Risk for activity intolerance will decrease Outcome: Progressing   Problem: Nutrition: Goal: Adequate nutrition will be maintained Outcome: Progressing   Problem: Coping: Goal: Level of anxiety will decrease Outcome: Progressing   Problem: Elimination: Goal: Will not experience complications related to bowel motility Outcome: Progressing Goal: Will not experience complications related to urinary retention Outcome: Progressing   Problem: Pain Managment: Goal: General experience of comfort will improve Outcome: Progressing   Problem: Safety: Goal: Ability to remain free from injury will improve Outcome: Progressing   Problem: Skin Integrity: Goal: Risk for impaired skin integrity will decrease Outcome: Progressing   Problem: Education: Goal: Knowledge of the prescribed therapeutic regimen will improve Outcome: Progressing   Problem: Bowel/Gastric: Goal: Gastrointestinal status for postoperative course will improve Outcome: Progressing   Problem: Cardiac: Goal: Ability to maintain an adequate cardiac output will improve Outcome: Progressing   Problem: Clinical Measurements: Goal: Postoperative complications will be avoided or minimized Outcome: Progressing   Problem: Respiratory: Goal: Respiratory status will improve Outcome: Progressing   Problem: Skin Integrity: Goal: Demonstration of wound  healing without infection will improve Outcome: Progressing

## 2020-04-26 NOTE — Progress Notes (Addendum)
Progress Note    04/26/2020 7:29 AM 9 Days Post-Op  Subjective:  C/o "gas pains". Is passing flatus. Loose sounding cough   Vitals:   04/26/20 0600 04/26/20 0700  BP: 106/61 (!) 112/55  Pulse: 90 (!) 101  Resp: (!) 29 (!) 26  Temp:    SpO2: 90% 93%    Physical Exam: General: alert and oriented in NAD Cardiac: irregular rhythm. tachycardic Lungs:  No wheezes, rhonchi Incisions:  Midline and right groin incisions healing without signs of infection.  Extremities:  Feet warm, no peripheral edema Abdomen:  Soft, Hyperactive BS CBC    Component Value Date/Time   WBC 18.2 (H) 04/26/2020 0037   RBC 2.47 (L) 04/26/2020 0037   HGB 7.9 (L) 04/26/2020 0037   HGB 12.1 (L) 08/20/2012 0610   HCT 24.7 (L) 04/26/2020 0037   HCT 44.0 08/02/2012 0853   PLT 233 04/26/2020 0037   PLT 158 08/20/2012 0610   MCV 100.0 04/26/2020 0037   MCV 99 08/02/2012 0853   MCH 32.0 04/26/2020 0037   MCHC 32.0 04/26/2020 0037   RDW 16.4 (H) 04/26/2020 0037   RDW 13.7 08/02/2012 0853   LYMPHSABS 0.5 (L) 04/22/2020 1023   MONOABS 1.1 (H) 04/22/2020 1023   EOSABS 0.0 04/22/2020 1023   BASOSABS 0.0 04/22/2020 1023    BMET    Component Value Date/Time   NA 147 (H) 04/26/2020 0037   NA 136 08/20/2012 0610   K 3.6 04/26/2020 0037   K 4.2 08/20/2012 0610   CL 119 (H) 04/26/2020 0037   CL 102 08/20/2012 0610   CO2 17 (L) 04/26/2020 0037   CO2 26 08/20/2012 0610   GLUCOSE 115 (H) 04/26/2020 0037   GLUCOSE 123 (H) 08/20/2012 0610   BUN 38 (H) 04/26/2020 0037   BUN 7 08/20/2012 0610   CREATININE 2.24 (H) 04/26/2020 0037   CREATININE 0.75 08/20/2012 0610   CALCIUM 7.7 (L) 04/26/2020 0037   CALCIUM 8.4 (L) 08/20/2012 0610   GFRNONAA 29 (L) 04/26/2020 0037   GFRNONAA >60 08/20/2012 0610   GFRAA >60 05/07/2017 0949   GFRAA >60 08/20/2012 0610     Intake/Output Summary (Last 24 hours) at 04/26/2020 0729 Last data filed at 04/26/2020 0700 Gross per 24 hour  Intake 1709.56 ml  Output 810 ml   Net 899.56 ml    HOSPITAL MEDICATIONS Scheduled Meds: . amoxicillin-clavulanate  1 tablet Oral Q12H  . atorvastatin  10 mg Oral QHS  . chlorhexidine  15 mL Mouth Rinse BID  . Chlorhexidine Gluconate Cloth  6 each Topical Daily  . darbepoetin (ARANESP) injection - NON-DIALYSIS  100 mcg Subcutaneous Q Mon-1800  . feeding supplement  1 Container Oral TID BM  . fluticasone furoate-vilanterol  1 puff Inhalation Daily  . lidocaine  2 patch Transdermal Q24H  . mouth rinse  15 mL Mouth Rinse BID  . mouth rinse  15 mL Mouth Rinse q12n4p  . metoprolol tartrate  25 mg Oral BID  . pantoprazole  40 mg Oral BID  . potassium chloride  40 mEq Oral Once  . risperiDONE  0.25 mg Sublingual BID  . sodium bicarbonate  1,300 mg Oral BID   Continuous Infusions: . sodium chloride 250 mL (04/23/20 0343)  . amiodarone 30 mg/hr (04/26/20 0700)  . dexmedetomidine (PRECEDEX) IV infusion Stopped (04/26/20 1275)  . heparin 2,050 Units/hr (04/26/20 0700)   PRN Meds:.acetaminophen **OR** acetaminophen, alum & mag hydroxide-simeth, bisacodyl, guaiFENesin-dextromethorphan, hydrALAZINE, HYDROmorphone (DILAUDID) injection, ondansetron, phenol, sodium phosphate  Assessment:  POD 9 aortobifemoral bypass. LE well perfused. ON heparin infusion due to a. Fib/RVR. Platelet count normal. Hgb trending down.  Post-op course complicated by: A.fib with RVR: rate currently 120s. On amiodarone infusion. ? cardioversion today AKI: Scr up again today to 2.24; UOP = 810cc >>nephrology following metabolic encephalopathy/confusion/delirium: co-operative this morning; seems to have resolved. Limiting narcotics/sedatives RLL pneumonia: SaO2 90s on 3L [place]. Tm 99.4 at midnight. On Zosyn. WBC trending up. Encourage cough/IS   GI: Tolerating some PO liquids. ? NPO for cardioversion today   Plan: -Considering rise in WBC, low grade temp will re-culture blood, urine and sputum -DVT prophylaxis:  Heparin infusion   Risa Grill,  PA-C [facility] [phone] 04/26/2020  7:29 AM   I agree with the above.  Have seen and evaluated patient  Neuro: Mental status continues to improve.  Narcotics minimized Pulmonary: Remains on nasal cannula oxygen with good O2 sats.   White blood cell count is trending up.  He remains on Zosyn.    He is using incentive spirometry.  X-ray shows worsening.  Pneumonia.  Will need to tailor antibiotics based on cultures. ID: On Zosyn for possible ischemic colitis and pneumonia.  Will change antibiotics based on culture results. GI:  Advance diet as tolerated  renal: Urine output remains adequate.  Creatinine bumped up slightly today.  We will continue to monitor Hypernatremia: Sodium trending down  A. fib: Now on heparin drip.  Cardiology planning cardioversion later today  [facility]

## 2020-04-26 NOTE — Transfer of Care (Signed)
Immediate Anesthesia Transfer of Care Note  Patient: Carlos Olson  Procedure(s) Performed: CARDIOVERSION (N/A ) TRANSESOPHAGEAL ECHOCARDIOGRAM (TEE) (N/A ) BUBBLE STUDY  Patient Location: PACU and Endoscopy Unit  Anesthesia Type:MAC  Level of Consciousness: patient cooperative and responds to stimulation  Airway & Oxygen Therapy: Patient Spontanous Breathing, Patient connected to nasal cannula oxygen and Patient connected to face mask oxygen  Post-op Assessment: Report given to RN and Post -op Vital signs reviewed and stable  Post vital signs: Reviewed and stable  Last Vitals:  Vitals Value Taken Time  BP 107/46 04/26/20 1215  Temp 36.5 C 04/26/20 1212  Pulse 80 04/26/20 1217  Resp 34 04/26/20 1217  SpO2 94 % 04/26/20 1217  Vitals shown include unvalidated device data.  Last Pain:  Vitals:   04/26/20 1212  TempSrc: Temporal  PainSc:       Patients Stated Pain Goal: 0 (72/90/21 1155)  Complications: No complications documented.

## 2020-04-26 NOTE — Progress Notes (Signed)
PCCM:  Patient seen this morning on rounds.  Remains off Precedex.  Plans for TEE cardioversion today with cardiology.  Mental status much improved.  Completed course of antimicrobials.  Post cardioversion patient likely stable for transfer from the intensive care unit, however will leave this to primary team.   Pulmonary critical care will sign off at this time.  Please call with any questions or concerns. We appreciate the consultation.   Garner Nash, DO [facility] 04/26/2020 11:51 AM

## 2020-04-26 NOTE — Progress Notes (Signed)
  Echocardiogram Echocardiogram Transesophageal has been performed.  Fidel Levy 04/26/2020, 12:25 PM

## 2020-04-26 NOTE — Progress Notes (Signed)
ANTICOAGULATION CONSULT NOTE - Follow Up Consult  Pharmacy Consult for IV Heparin Indication: atrial fibrillation  Allergies  Allergen Reactions   Levaquin [Levofloxacin]     Aortic Aneurysm   Sulfonamide Derivatives Itching   Sulfasalazine Itching    Patient Measurements: Height: 6\' 3"  (190.5 cm) Weight: 94.7 kg (208 lb 12.4 oz) IBW/kg (Calculated) : 84.5 Heparin Dosing Weight: 95.3 kg   Vital Signs: Temp: 99.4 F (37.4 C) (10/28 0000) Temp Source: Oral (10/28 0000) BP: 105/55 (10/28 0000) Pulse Rate: 108 (10/28 0000)  Labs: Recent Labs    04/24/20 0439 04/24/20 0439 04/24/20 1437 04/25/20 0803 04/26/20 0037  HGB 9.0*   < >  --  8.6* 7.9*  HCT 27.5*  --   --  26.7* 24.7*  PLT 202  --   --  227 233  HEPARINUNFRC 0.24*   < > 0.28* 0.34 0.27*  CREATININE 1.77*  --   --  1.87* 2.24*   < > = values in this interval not displayed.    Estimated Creatinine Clearance: 31.4 mL/min (A) (by C-G formula based on SCr of 2.24 mg/dL (H)).   Medical History: Past Medical History:  Diagnosis Date   AAA (abdominal aortic aneurysm) (HCC)    Atherosclerosis of aortic arch ([place]) 09/25/2015   By CT scan    CAD (coronary artery disease) 09/25/2015   Mild 3v by CT scan    Cancer [facility])    prostate   Colon polyps    COPD (chronic obstructive pulmonary disease) (HCC)    Diverticulosis of colon    Femoral bruit    bilateral, found by Dr. Jefm Bryant   Heart murmur    child   History of prostate cancer    IBS (irritable bowel syndrome)    improved after retirement   Osteoarthritis    knees and cervical/lumbar spine s/p surgeries and injections   Pelvic fracture ([place])    Pneumonia    child   Pulmonary nodules 09/25/2015   On screening CT scan, rpt 1 yr    Shortness of breath    doe    Assessment: 79 yr old male, S/P elective endovascular repair of abdominal aortic aneurysm on 10/18; has been having AKI (slowly improving) in addition to Afib. Pharmacy was  consulted to dose IV heparin; pt was not on anticoagulants PTA.  Heparin level down to slightly subtherapeutic (0.27) on gtt at 1950 units/hr. RN notes IV had to be changed but that was about 7pm and was off <30 min so shouldn't have large effect on heparin level. Hgb down to 7.9 but no bleeding noted per RN.  Goal of Therapy:  Heparin level 0.3-0.7 units/ml Monitor platelets by anticoagulation protocol: Yes   Plan:  Increase heparin infusion slighlty to 2050 units/hr F/u 8 hr heparin level  Sherlon Handing, PharmD, BCPS Please see amion for complete clinical pharmacist phone list 04/26/2020 1:19 AM

## 2020-04-26 NOTE — Progress Notes (Signed)
Patient returned from Endo.  

## 2020-04-26 NOTE — Anesthesia Postprocedure Evaluation (Signed)
Anesthesia Post Note  Patient: Carlos Olson  Procedure(s) Performed: CARDIOVERSION (N/A ) TRANSESOPHAGEAL ECHOCARDIOGRAM (TEE) (N/A ) BUBBLE STUDY     Patient location during evaluation: PACU Anesthesia Type: General Level of consciousness: awake Pain management: pain level controlled Vital Signs Assessment: post-procedure vital signs reviewed and stable Respiratory status: spontaneous breathing, nonlabored ventilation, respiratory function stable and patient connected to nasal cannula oxygen Cardiovascular status: blood pressure returned to baseline and stable Postop Assessment: no apparent nausea or vomiting Anesthetic complications: no   No complications documented.  Last Vitals:  Vitals:   04/26/20 2000 04/26/20 2100  BP: (!) 130/59 135/60  Pulse: (!) 123 (!) 108  Resp: (!) 28 (!) 24  Temp: 37.1 C   SpO2: 94% 94%    Last Pain:  Vitals:   04/26/20 2000  TempSrc: Oral  PainSc: 0-No pain                 Stalin Gruenberg P Zitlali Primm

## 2020-04-26 NOTE — Progress Notes (Signed)
[facility] Progress Note    Assessment/ Plan:   AKI: Likely secondary to acute tubular injury in the context of hypotension, worsening anemia -Baseline creatinine seems to be around 1 -Peak creatinine 3.1 on 10/21,  Nadir was 1.77.  Now trending worse again , I think due to hemodynamic reasons with rapid A fib- hopefully will stabilize- no dialysis needs    Hypernatremia - trending better-   Drinking water and also was getting d5w-  Appears to have been stopped.  Since trending better will not resume   Metabolic acidosis -related to aki, oral bicarb is ordered - pretty stable  H/o HTN: intermittently hypotensive here, on rate ctrl for afib.   Anemia -Transfuse for Hgb<7 g/dL- added ESA- not helping the afib I am sure  A. fib.  Currently on amio drip, anticoagulation resumed-  Cards considering cardioversion-  repleting potassium- will give another dose today    Encephalopathy: ICU delirium & benzos, management per primary service. 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 had 810 of UOP- trending down-  Ended up positive 899.  Sodium better but crt up from nadir.  He is alert and pleasant this AM    Objective:   BP (!) 112/55   Pulse (!) 101   Temp 99.4 F (37.4 C) (Oral)   Resp (!) 26   Ht 6\' 3"  (1.905 m)   Wt 94.7 kg   SpO2 93%   BMI 26.10 kg/m   Intake/Output Summary (Last 24 hours) at 04/26/2020 5631 Last data filed at 04/26/2020 0700 Gross per 24 hour  Intake 1709.56 ml  Output 810 ml  Net 899.56 ml   Weight change:   Physical Exam: Gen: less alert- mittens on HEENT: dry mucosal membranes SHF:WYOVZ irreg Resp: diminished air entry right base, normal wob, bl chest expansion CHY:IFOY Ext: pitting edema to dep areas Neuro:  Less confused  Imaging: No results found.  Labs: BMET Recent  Labs  Lab 04/21/20 1620 04/21/20 1620 04/21/20 1655 04/22/20 1023 04/23/20 0202 04/23/20 1144 04/24/20 0439 04/25/20 0803 04/26/20 0037  NA 151*   < > 151* 153* 156* 154* 151* 149* 147*  K 3.6   < > 3.5 3.6 3.3* 3.3* 3.1* 3.0* 3.6  CL 122*  --   --  124* 125* 126* 121* 120* 119*  CO2 17*  --   --  18* 18* 20* 19* 18* 17*  GLUCOSE 110*  --   --  109* 112* 130* 134* 116* 115*  BUN 55*  --   --  53* 54* 47* 37* 37* 38*  CREATININE 2.31*  --   --  2.10* 2.06* 1.96* 1.77* 1.87* 2.24*  CALCIUM 8.4*  --   --  8.3* 8.2* 8.2* 7.7* 7.7* 7.7*  PHOS  --   --   --  4.0  --   --   --   --   --    < > = values in this interval not displayed.   CBC Recent Labs  Lab 04/22/20 1023 04/22/20 1023 04/23/20 0202 04/24/20 0439 04/25/20 0803 04/26/20 0037  WBC 17.7*   < > 16.2* 14.3* 17.1* 18.2*  NEUTROABS 15.9*  --   --   --   --   --   HGB 9.3*   < > 8.9* 9.0* 8.6* 7.9*  HCT  28.7*   < > 27.3* 27.5* 26.7* 24.7*  MCV 97.6   < > 98.6 99.6 99.3 100.0  PLT 145*   < > 153 202 227 233   < > = values in this interval not displayed.    Medications:    . amoxicillin-clavulanate  1 tablet Oral Q12H  . atorvastatin  10 mg Oral QHS  . chlorhexidine  15 mL Mouth Rinse BID  . Chlorhexidine Gluconate Cloth  6 each Topical Daily  . darbepoetin (ARANESP) injection - NON-DIALYSIS  100 mcg Subcutaneous Q Mon-1800  . feeding supplement  1 Container Oral TID BM  . fluticasone furoate-vilanterol  1 puff Inhalation Daily  . lidocaine  2 patch Transdermal Q24H  . mouth rinse  15 mL Mouth Rinse BID  . mouth rinse  15 mL Mouth Rinse q12n4p  . metoprolol tartrate  25 mg Oral BID  . pantoprazole  40 mg Oral BID  . risperiDONE  0.25 mg Sublingual BID  . sodium bicarbonate  1,300 mg Oral BID      Louis Meckel  [employer] 04/26/2020, 7:13 AM

## 2020-04-26 NOTE — Evaluation (Signed)
Clinical/Bedside Swallow Evaluation Patient Details  Name: Carlos Olson MRN: [id number] Date of Birth: [birth year]  Today's Date: 04/26/2020 Time: SLP Start Time (ACUTE ONLY): 21 SLP Stop Time (ACUTE ONLY): 46 SLP Time Calculation (min) (ACUTE ONLY): 31 min  Past Medical History:  Past Medical History:  Diagnosis Date   AAA (abdominal aortic aneurysm) ([place])    Atherosclerosis of aortic arch ([place]) 09/25/2015   By CT scan    CAD (coronary artery disease) 09/25/2015   Mild 3v by CT scan    Cancer [facility])    prostate   Colon polyps    COPD (chronic obstructive pulmonary disease) (HCC)    Diverticulosis of colon    Femoral bruit    bilateral, found by Dr. Jefm Bryant   Heart murmur    child   History of prostate cancer    IBS (irritable bowel syndrome)    improved after retirement   Osteoarthritis    knees and cervical/lumbar spine s/p surgeries and injections   Pelvic fracture ([place])    Pneumonia    child   Pulmonary nodules 09/25/2015   On screening CT scan, rpt 1 yr    Shortness of breath    doe   Past Surgical History:  Past Surgical History:  Procedure Laterality Date   ANTERIOR CERVICAL DECOMP/DISCECTOMY FUSION  07/29/2011   Procedure: ANTERIOR CERVICAL DECOMPRESSION/DISCECTOMY FUSION 1 LEVEL;  Surgeon: Jessy Oto, MD;  Location: [place];  Service: Orthopedics;  Laterality: N/A;  Anterior Cervical Discectomy and Fusion C3-4   AORTA - BILATERAL FEMORAL ARTERY BYPASS GRAFT N/A 04/17/2020   Procedure: AORTA BIFEMORAL BYPASS GRAFT USING A HEMASHIELD GOLD BIFURCATED GRAFT;  Surgeon: Waynetta Sandy, MD;  Location: [place];  Service: Vascular;  Laterality: N/A;   CATARACT EXTRACTION W/PHACO Right 07/10/2015   Procedure: CATARACT EXTRACTION PHACO AND INTRAOCULAR LENS PLACEMENT ([place]);  Surgeon: Birder Robson, MD;  Location: [facility];  Service: Ophthalmology;  Laterality: Right;  [place] 00:58    CATARACT EXTRACTION W/PHACO Left 07/24/2015   Procedure:  CATARACT EXTRACTION PHACO AND INTRAOCULAR LENS PLACEMENT (IOC);  Surgeon: Birder Robson, MD;  Location: [facility];  Service: Ophthalmology;  Laterality: Left;  [place] 00:38    COLONOSCOPY  12/2007   [place], diverticulosis and polyp, rpt 5 yrs   dental implants     EMBOLIZATION Right 03/26/2020   Procedure: EMBOLIZATION;  Surgeon: Waynetta Sandy, MD;  Location: [place] [facility];  Service: Cardiovascular;  Laterality: Right;   FORAMINOTOMY 2 LEVEL Left 10/2013   C4/5, C5/6 ([place])   HERNIA REPAIR     right 05/1999, left 09/04/05   JOINT REPLACEMENT Right    TKR   KNEE ARTHROPLASTY Left 05/20/2017   Procedure: COMPUTER ASSISTED TOTAL KNEE ARTHROPLASTY;  Surgeon: Dereck Leep, MD;  Location: [facility];  Service: Orthopedics;  Laterality: Left;   KNEE ARTHROSCOPY  09/1997   left   POSTERIOR CERVICAL FUSION/FORAMINOTOMY N/A 11/22/2013   Procedure: LEFT C4-5 AND C5-6 FORAMINOTOMY;  Surgeon: Jessy Oto, MD;  Location: [place];  Service: Orthopedics;  Laterality: N/A;   PROSTATECTOMY  08/1990   REPLACEMENT TOTAL KNEE Right 07/2012   TONSILLECTOMY     HPI:  Carlos Olson is a 79 y.o. 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 CXR 04/23/20 revealed lower lobe opacification with trace pleural fluid. CXR 10/28: Persistent BILATERAL pulmonary infiltrates consistent with multifocal pneumonia,  slightly greater on RIGHT.   Assessment / Plan / Recommendation Clinical Impression  Pt was seen for bedside swallow evaluation and he denied a history of dysphagia. Oral mechanism exam revealed mildly reduced velar elevation to the right but was otherwise [facility]. He presented with full dentures but maxillary dentures were ill fitting, impeded mastication, and were therefore removed. He tolerated all solids and liquids without signs or symptoms of aspiration. Mastication was prolonged with  regular textures due to reduced dentition, but was otherwise [facility]. A dysphagia 3 diet with thin liquids will be initiated at this time. Considering results of imaging combined with RN's reports of signs of aspiration with thin liquids, a modified barium swallow study is recommended to further assess swallow function.  SLP Visit Diagnosis: Dysphagia, unspecified (R13.10)    Aspiration Risk  Mild aspiration risk    Diet Recommendation Dysphagia 3 (Mech soft);Thin liquid   Liquid Administration via: Cup;Straw Medication Administration: Whole meds with puree Supervision: Staff to assist with self feeding Compensations: Slow rate;Small sips/bites Postural Changes: Seated upright at 90 degrees    Other  Recommendations Oral Care Recommendations: Oral care BID   Follow up Recommendations Other (comment) (TBD)      Frequency and Duration min 2x/week  1 week       Prognosis Prognosis for Safe Diet Advancement: Good      Swallow Study   General Date of Onset: 04/25/20 HPI: Carlos Olson is a 79 y.o. 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 CXR 04/23/20 revealed lower lobe opacification with trace pleural fluid. CXR 10/28: Persistent BILATERAL pulmonary infiltrates consistent with multifocal pneumonia, slightly greater on RIGHT. Type of Study: Bedside Swallow Evaluation Previous Swallow Assessment: None Diet Prior to this Study: NPO Temperature Spikes Noted: No Respiratory Status: Nasal cannula History of Recent Intubation: Yes Length of Intubations (days):  (for procedure) Date extubated: 04/17/20 Behavior/Cognition: Alert;Cooperative;Pleasant mood Oral Cavity Assessment: Within Functional Limits Oral Care Completed by SLP: No Oral Cavity - Dentition: Dentures, top;Dentures, bottom (ill-fitting) Vision: Functional for self-feeding Self-Feeding Abilities: Able to feed  self Patient Positioning: Upright in bed;Postural control adequate for testing Baseline Vocal Quality: Normal Volitional Cough: Strong Volitional Swallow: Able to elicit    Oral/Motor/Sensory Function Overall Oral Motor/Sensory Function: Within functional limits   Ice Chips Ice chips: Within functional limits Presentation: Spoon   Thin Liquid Thin Liquid: Within functional limits Presentation: Straw    Nectar Thick Nectar Thick Liquid: Not tested   Honey Thick Honey Thick Liquid: Not tested   Puree Puree: Within functional limits Presentation: Spoon   Solid     Solid: Within functional limits Presentation: Merkel I. Hardin Negus, [place], [place] Office number [phone] Pager [phone]  Horton Marshall 04/26/2020,4:29 PM

## 2020-04-26 NOTE — Procedures (Signed)
     Transesophageal Echocardiogram Note  JOYCE LECKEY [id number] 01/10/1941  Procedure: Transesophageal Echocardiogram Indications: Atrial fibrillation  Procedure Details Consent: Obtained Time Out: Verified patient identification, verified procedure, site/side was marked, verified correct patient position, special equipment/implants available, Radiology Safety Procedures followed,  medications/allergies/relevent history reviewed, required imaging and test results available.  Performed  Medications: Propofol 360mg  administered by anesthesia Given neo and vaso during procedure as well for hypotension (please see anesthesia note)  Left Ventrical:  Mildly reduced LVEF 50%  Mitral Valve: Trace MR  Aortic Valve: Trace AI  Tricuspid Valve: Mild TR  Pulmonic Valve: Trace PI  Left Atrium/ Left atrial appendage: No LAA thrombus  Atrial septum: Aneurysmal. Bubble study positive for PFO   Aorta: Grade III plaque in the descending aorta and aortic arch   Complications: No apparent complications. Patient was hypotensive during the procedure which improved with vasopressin and neo administration. Patient did tolerate procedure well.   Procedure: Electrical Cardioversion Indications:  Atrial Fibrillation  Procedure Details:  Consent: Risks of procedure as well as the alternatives and risks of each were explained to the (patient/caregiver).  Consent for procedure obtained.  Time Out: Verified patient identification, verified procedure, site/side was marked, verified correct patient position, special equipment/implants available, medications/allergies/relevent history reviewed, required imaging and test results available. PERFORMED.  Patient placed on cardiac monitor, pulse oximetry, supplemental oxygen as necessary.  Sedation given: Propofol 360mg  Pacer pads placed anterior and posterior chest.  Cardioverted 2 time(s).  Cardioversion with synchronized biphasic 200J  shock.  Evaluation: Findings: Post procedure EKG shows: NSR with frequent PACs Complications: None Patient did tolerate procedure well.  Time Spent Directly with the Patient:  45 minutes   Freada Bergeron 04/26/2020, 12:08 PM

## 2020-04-26 NOTE — Progress Notes (Signed)
SLP Cancellation Note  Patient Details Name: Carlos Olson MRN: [id number] DOB: [birth year]   Cancelled treatment:       Reason Eval/Treat Not Completed: Patient at procedure or test/unavailable   Krue Peterka, Katherene Ponto 04/26/2020, 11:39 AM

## 2020-04-26 NOTE — Progress Notes (Deleted)
  Echocardiogram 2D Echocardiogram has been performed.  Carlos Olson 04/26/2020, 12:23 PM

## 2020-04-26 NOTE — Progress Notes (Addendum)
Progress Note   Subjective   Remains in afib - rates in the 120-130's. Plan for TEE/DCCV today.  Inpatient Medications    Scheduled Meds: . amoxicillin-clavulanate  1 tablet Oral Q12H  . atorvastatin  10 mg Oral QHS  . chlorhexidine  15 mL Mouth Rinse BID  . Chlorhexidine Gluconate Cloth  6 each Topical Daily  . darbepoetin (ARANESP) injection - NON-DIALYSIS  100 mcg Subcutaneous Q Mon-1800  . feeding supplement  1 Container Oral TID BM  . fluticasone furoate-vilanterol  1 puff Inhalation Daily  . lidocaine  2 patch Transdermal Q24H  . mouth rinse  15 mL Mouth Rinse BID  . mouth rinse  15 mL Mouth Rinse q12n4p  . metoprolol tartrate  25 mg Oral BID  . pantoprazole  40 mg Oral BID  . potassium chloride  40 mEq Oral Once  . risperiDONE  0.25 mg Sublingual BID  . sodium bicarbonate  1,300 mg Oral BID   Continuous Infusions: . sodium chloride 250 mL (04/23/20 0343)  . amiodarone 30 mg/hr (04/26/20 0800)  . dexmedetomidine (PRECEDEX) IV infusion Stopped (04/26/20 3235)  . heparin 2,050 Units/hr (04/26/20 0800)   PRN Meds: acetaminophen **OR** acetaminophen, alum & mag hydroxide-simeth, bisacodyl, guaiFENesin-dextromethorphan, hydrALAZINE, HYDROmorphone (DILAUDID) injection, ondansetron, phenol, sodium phosphate   Vital Signs    Vitals:   04/26/20 0700 04/26/20 0745 04/26/20 0753 04/26/20 0800  BP: (!) 112/55  (!) 112/55 (!) 110/99  Pulse: (!) 101 (!) 112 (!) 118 99  Resp: (!) 26 (!) 29 (!) 28 (!) 30  Temp:      TempSrc:      SpO2: 93% 95% 92% 95%  Weight:      Height:        Intake/Output Summary (Last 24 hours) at 04/26/2020 0826 Last data filed at 04/26/2020 0800 Gross per 24 hour  Intake 1746.76 ml  Output 740 ml  Net 1006.76 ml   Filed Weights   04/17/20 0603 04/22/20 1507 04/24/20 0453  Weight: 95.3 kg 95.3 kg 94.7 kg    Telemetry    Afib with RVR - Personally Reviewed  Physical Exam   General appearance: alert and no distress Neck: no carotid  bruit, no JVD and thyroid not enlarged, symmetric, no tenderness/mass/nodules Lungs: diminished breath sounds bilaterally Heart: irregularly irregular rhythm and tachycardic Abdomen: large midline incision, ecchymosis Extremities: extremities normal, atraumatic, no cyanosis or edema Pulses: 2+ and symmetric Skin: pale, warm, dry Neurologic: Mental status: sedated, calm today Psych: Cannot assess  Labs    Chemistry Recent Labs  Lab 04/20/20 1955 04/21/20 0236 04/21/20 0850 04/21/20 1620 04/24/20 0439 04/25/20 0803 04/26/20 0037  NA 146*   < > 146*   < > 151* 149* 147*  K 3.9   < > 3.8   < > 3.1* 3.0* 3.6  CL 118*   < > 119*   < > 121* 120* 119*  CO2 17*   < > 18*   < > 19* 18* 17*  GLUCOSE 123*   < > 124*   < > 134* 116* 115*  BUN 49*   < > 53*   < > 37* 37* 38*  CREATININE 2.42*   < > 2.33*   < > 1.77* 1.87* 2.24*  CALCIUM 8.2*   < > 8.1*   < > 7.7* 7.7* 7.7*  PROT 4.6*  --  4.7*  --   --   --   --   ALBUMIN 2.0*  --  1.8*  --   --   --   --  AST 45*  --  40  --   --   --   --   ALT 21  --  18  --   --   --   --   ALKPHOS 48  --  49  --   --   --   --   BILITOT 0.8  --  0.7  --   --   --   --   GFRNONAA 26*   < > 28*   < > 38* 36* 29*  ANIONGAP 11   < > 9   < > 11 11 11    < > = values in this interval not displayed.     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  Assessment & Plan    1.  afib Rates remain elevated, especially  with exertion. Echo showed LVEF 50% with moderate LAE and mild RAE Secondary to acute medical illness / delerium Continue IV amiodarone, lopressor 25 mg BID Continue IV heparin Plan for TEE-guided cardioversion today. 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 Would transition to Nord after the procedure and switch to po amiodarone.  2. Acute renal failure Urine output improved (still net positive 10L) Nephrology notes reviewed ACEi on hold Creatinine has risen again - nephrology is following (now 2.24 today, from 1.87 yesterday), thought to be due to hemodynamic issues  3. HTN Stable, bp's improved  4. Delirium Continues - head CT negative for acute process No change required today  5. Thrombocytopenia/ anemia Hemoglobin stable around 9 Transfuse for hb <7 g/dl Platelets are improving  6. New infiltrate on CXR, ?aspiration - on Zosyn per PCCM, leukocytosis is worse (14->18K today)  Discussed findings with wife at the bedside  TIME SPENT WITH PATIENT: 35 minutes of direct patient care. More than 50% of that time was spent on coordination of care and counseling regarding afib, anticoagulation, AAA repair, AKI.  Pixie Casino, MD, [facility], [place] Director of the [facility]  [facility] Diplomate of the American Board of Clinical Lipidology Attending Cardiologist  Direct Dial: [phone]  Fax: [phone]  Website:  [email or web address]  04/26/2020 8:26 AM

## 2020-04-26 NOTE — Progress Notes (Signed)
Patient to Endo for cardioversion.

## 2020-04-27 ENCOUNTER — Inpatient Hospital Stay (HOSPITAL_COMMUNITY): Payer: Medicare HMO

## 2020-04-27 DIAGNOSIS — I4819 Other persistent atrial fibrillation: Secondary | ICD-10-CM | POA: Diagnosis not present

## 2020-04-27 DIAGNOSIS — N179 Acute kidney failure, unspecified: Secondary | ICD-10-CM | POA: Diagnosis not present

## 2020-04-27 DIAGNOSIS — I714 Abdominal aortic aneurysm, without rupture: Secondary | ICD-10-CM | POA: Diagnosis not present

## 2020-04-27 LAB — BASIC METABOLIC PANEL
Anion gap: 9 (ref 5–15)
BUN: 45 mg/dL — ABNORMAL HIGH (ref 8–23)
CO2: 19 mmol/L — ABNORMAL LOW (ref 22–32)
Calcium: 7.9 mg/dL — ABNORMAL LOW (ref 8.9–10.3)
Chloride: 119 mmol/L — ABNORMAL HIGH (ref 98–111)
Creatinine, Ser: 2.6 mg/dL — ABNORMAL HIGH (ref 0.61–1.24)
GFR, Estimated: 24 mL/min — ABNORMAL LOW (ref >60)
Glucose, Bld: 123 mg/dL — ABNORMAL HIGH (ref 70–99)
Potassium: 3.3 mmol/L — ABNORMAL LOW (ref 3.5–5.1)
Sodium: 147 mmol/L — ABNORMAL HIGH (ref 135–145)

## 2020-04-27 LAB — CBC
HCT: 24.1 % — ABNORMAL LOW (ref 39.0–52.0)
Hemoglobin: 7.7 g/dL — ABNORMAL LOW (ref 13.0–17.0)
MCH: 32 pg (ref 26.0–34.0)
MCHC: 32 g/dL (ref 30.0–36.0)
MCV: 100 fL (ref 80.0–100.0)
Platelets: 273 10*3/uL (ref 150–400)
RBC: 2.41 MIL/uL — ABNORMAL LOW (ref 4.22–5.81)
RDW: 16.4 % — ABNORMAL HIGH (ref 11.5–15.5)
WBC: 16 10*3/uL — ABNORMAL HIGH (ref 4.0–10.5)
nRBC: 0 % (ref 0.0–0.2)

## 2020-04-27 LAB — HEPARIN LEVEL (UNFRACTIONATED): Heparin Unfractionated: 0.22 IU/mL — ABNORMAL LOW (ref 0.30–0.70)

## 2020-04-27 MED ORDER — PANTOPRAZOLE SODIUM 40 MG PO TBEC
40.0000 mg | DELAYED_RELEASE_TABLET | Freq: Every day | ORAL | Status: DC
  Administered 2020-04-28 – 2020-04-30 (×3): 40 mg via ORAL
  Filled 2020-04-27 (×4): qty 1

## 2020-04-27 MED ORDER — AMIODARONE HCL 200 MG PO TABS
200.0000 mg | ORAL_TABLET | Freq: Two times a day (BID) | ORAL | Status: DC
  Administered 2020-04-27 – 2020-05-01 (×7): 200 mg via ORAL
  Filled 2020-04-27 (×8): qty 1

## 2020-04-27 MED ORDER — POTASSIUM CHLORIDE CRYS ER 20 MEQ PO TBCR
20.0000 meq | EXTENDED_RELEASE_TABLET | Freq: Once | ORAL | Status: AC
  Administered 2020-04-27: 20 meq via ORAL
  Filled 2020-04-27: qty 1

## 2020-04-27 MED ORDER — APIXABAN 2.5 MG PO TABS
2.5000 mg | ORAL_TABLET | Freq: Two times a day (BID) | ORAL | Status: DC
  Administered 2020-04-27 – 2020-04-30 (×6): 2.5 mg via ORAL
  Filled 2020-04-27 (×7): qty 1

## 2020-04-27 MED ORDER — VANCOMYCIN HCL IN DEXTROSE 1-5 GM/200ML-% IV SOLN
1000.0000 mg | INTRAVENOUS | Status: DC
  Administered 2020-04-28 – 2020-05-01 (×4): 1000 mg via INTRAVENOUS
  Filled 2020-04-27 (×5): qty 200

## 2020-04-27 MED ORDER — PIPERACILLIN-TAZOBACTAM 3.375 G IVPB
3.3750 g | Freq: Three times a day (TID) | INTRAVENOUS | Status: DC
  Administered 2020-04-28 – 2020-05-04 (×20): 3.375 g via INTRAVENOUS
  Filled 2020-04-27 (×19): qty 50

## 2020-04-27 MED ORDER — POTASSIUM CHLORIDE CRYS ER 10 MEQ PO TBCR
20.0000 meq | EXTENDED_RELEASE_TABLET | Freq: Once | ORAL | Status: AC
  Administered 2020-04-27: 20 meq via ORAL
  Filled 2020-04-27 (×2): qty 2

## 2020-04-27 MED ORDER — VANCOMYCIN HCL 1750 MG/350ML IV SOLN
1750.0000 mg | Freq: Once | INTRAVENOUS | Status: AC
  Administered 2020-04-28: 1750 mg via INTRAVENOUS
  Filled 2020-04-27: qty 350

## 2020-04-27 MED ORDER — ENSURE ENLIVE PO LIQD
237.0000 mL | Freq: Three times a day (TID) | ORAL | Status: DC
  Administered 2020-04-27 – 2020-04-30 (×5): 237 mL via ORAL

## 2020-04-27 NOTE — Progress Notes (Signed)
ANTICOAGULATION CONSULT NOTE - Follow Up Consult  Pharmacy Consult for IV Heparin transition to Eliquis Indication: atrial fibrillation  Allergies  Allergen Reactions  . Levaquin [Levofloxacin]     Aortic Aneurysm  . Sulfonamide Derivatives Itching  . Sulfasalazine Itching    Patient Measurements: Height: 6\' 3"  (190.5 cm) Weight: 94.7 kg (208 lb 12.4 oz) IBW/kg (Calculated) : 84.5 Heparin Dosing Weight: 95.3 kg   Vital Signs: Temp: 98.8 F (37.1 C) (10/29 1508) BP: 152/73 (10/29 1200) Pulse Rate: 84 (10/29 1200)  Labs: Recent Labs    04/25/20 0803 04/25/20 0803 04/26/20 0037 04/26/20 1020 04/27/20 0044  HGB 8.6*   < > 7.9*  --  7.7*  HCT 26.7*  --  24.7*  --  24.1*  PLT 227  --  233  --  273  LABPROT  --   --   --  17.3*  --   INR  --   --   --  1.5*  --   HEPARINUNFRC 0.34   < > 0.27* 0.35 0.22*  CREATININE 1.87*  --  2.24*  --  2.60*   < > = values in this interval not displayed.    Estimated Creatinine Clearance: 27.1 mL/min (A) (by C-G formula based on SCr of 2.6 mg/dL (H)).  Assessment: 79 yr old male, S/P elective endovascular repair of abdominal aortic aneurysm on 10/18; has been having AKI (slowly improving) in addition to Afib. Pharmacy was consulted to dose Eliquis; pt was not on anticoagulants PTA.  Patient has been refusing heparin level lab draws but has passed his swallow eval and has tolerated oral meds. Hgb low at 7.7, PLTs stable. Per RN, no bleeding noted. Patient's renal function continues to fluctuate with Scr 2.6.   Goal of Therapy:  Monitor platelets by anticoagulation protocol: Yes   Plan:  Stop heparin infusion at time of first Eliquis dose Start Eliquis 2.5mg  BID Monitor CBC, s/s bleeding, renal function   Romilda Garret, PharmD PGY1 Acute Care Pharmacy Resident Phone: [phone] 04/27/2020 3:32 PM  Please check [email or web address] for unit specific pharmacy phone numbers.

## 2020-04-27 NOTE — Discharge Instructions (Signed)

## 2020-04-27 NOTE — Progress Notes (Signed)
Modified Barium Swallow Progress Note  Patient Details  Name: NORMAL RECINOS MRN: [id number] Date of Birth: 12-14-1940  Today's Date: 04/27/2020  Modified Barium Swallow completed.  Full report located under Chart Review in the Imaging Section.  Brief recommendations include the following:  Clinical Impression  Pt presented with mild oropharyngeal dysphagia characterized by reduced bolus propulsion, reduced lingual retraction, reduced pharyngeal constriction, and reduced anterior laryngeal movement. He exhibited difficulty with A-P transport of boluses and bolus transport was intermittently slowed through the pharynx. Bolus aggregation in the valleculae was noted with regular texture solids and pt denied sensation of the bolus stating that he had swallowed it. The bolus was ultimately cleared with a liquid wash. He demonstrated vallecular residue, posterior pharyngeal wall residue, and pyriform sinus residue which was improved with liquid washes. No episodes of penetration/aspiration were noted during the study. It is recommended that the current diet of dysphagia 2 solids and thin liquids be continued at this time with observance of swallowing precautions and SLP will follow briefly to ensure tolerance.    Swallow Evaluation Recommendations       SLP Diet Recommendations: Dysphagia 3 (Mech soft) solids;Thin liquid   Liquid Administration via: Cup;Straw   Medication Administration: Whole meds with liquid   Supervision: Staff to assist with self feeding   Compensations: Slow rate;Small sips/bites;Minimize environmental distractions;Follow solids with liquid   Postural Changes: Remain semi-upright after after feeds/meals (Comment);Seated upright at 90 degrees   Oral Care Recommendations: Oral care BID       Rayhan Groleau I. Hardin Negus, [place], [place] Office number [phone] Pager [place] 04/27/2020,3:36 PM

## 2020-04-27 NOTE — Progress Notes (Signed)
OT Cancellation Note  Patient Details Name: Carlos Olson MRN: [id number] DOB: 09-30-1940   Cancelled Treatment:    Reason Eval/Treat Not Completed: Patient at procedure or test/ unavailable (Pt leaving for MBS. Will continue to follow.)  Malka So 04/27/2020, 2:24 PM  Nestor Lewandowsky, OTR/L Acute Rehabilitation Services Pager: [phone] Office: [phone]

## 2020-04-27 NOTE — Progress Notes (Addendum)
Initial Nutrition Assessment  DOCUMENTATION CODES:   Severe malnutrition in context of acute illness/injury  INTERVENTION:    Continue Ensure Enlive po TID, each supplement provides 350 kcal and 20 grams of protein  Given severe malnutrition and ongoing minimal intake, recommend placing Cortrak tube (service available M-W-F) for enteral nutrition support.  NUTRITION DIAGNOSIS:   Severe Malnutrition related to acute illness (AAA repair surgery with minimal intake x 10 days since admission) as evidenced by moderate muscle depletion, energy intake < or equal to 50% for > or equal to 5 days.  Ongoing  GOAL:   Patient will meet greater than or equal to 90% of their needs   Unmet  MONITOR:   PO intake, Supplement acceptance  REASON FOR ASSESSMENT:   Rounds    ASSESSMENT:   79 yo male admitted 10/18 for elective endovascular repair of AAA. PMH includes AAA, prostate Ca, heart murmur, diverticulitis, COPD, CAD, atherosclerosis, IBS.   S/P electrical cardioversion 10/28. S/P bedside swallow evaluation 10/28, diet advanced to dysphagia 3 with thin liquids. Diet was changed to heart healthy this morning.  S/P MBS this afternoon, SLP recommends dysphagia 3 diet with thin liquids.   Patient c/o minimal intake r/t poor appetite, however, patient is confused. Chocolate Ensure Enlive at bedside, patient states he does not like it, but family member at bedside states patient drank a chocolate Ensure yesterday.   Requiring 6 L HFNC  Labs reviewed. Na 147, K 3.3, BUN 45, creat 2.60  Medications reviewed and include Aranesp, potassium chloride, sodium bicarb.  I/O +12.5 L since admission UOP 270 ml x 24 hours  Intake has been minimal since admission. NPO on admission through 10/25. NG tube placed 10/25; trickle TF started: Osmolite 1.2 at 20 ml/h. NG tube accidentally removed 10/26. Clear liquids 10/26. NPO 10/28 for procedure, then advanced to dysphagia 3 with thin  liquids.  Patient meets criteria for severe malnutrition.  He will likely require Cortrak placement and nutrition support to meet nutrition needs.     Most Recent Value  Orbital Region Mild depletion  Upper Arm Region Mild depletion  Thoracic and Lumbar Region Moderate depletion  Buccal Region No depletion  Temple Region No depletion  Clavicle Bone Region Moderate depletion  Clavicle and Acromion Bone Region Moderate depletion  Scapular Bone Region Moderate depletion  Dorsal Hand Moderate depletion  Patellar Region Moderate depletion  Anterior Thigh Region Moderate depletion  Posterior Calf Region Mild depletion  Edema (RD Assessment) Mild  Hair Reviewed  Eyes Reviewed  Mouth Reviewed  Skin Reviewed  Nails Reviewed       Diet Order:   Diet Order            Diet Heart Room service appropriate? Yes; Fluid consistency: Thin  Diet effective now                 EDUCATION NEEDS:   No education needs have been identified at this time  Skin:  Skin Assessment: Reviewed RN Assessment (abd & R groin surgical incisions)  Last BM:  10/29  Height:   Ht Readings from Last 1 Encounters:  04/26/20 6\' 3"  (1.905 m)    Weight:   Wt Readings from Last 1 Encounters:  04/26/20 94.7 kg    Ideal Body Weight:  89.1 kg  BMI:  Body mass index is 26.1 kg/m.  Estimated Nutritional Needs:   Kcal:  2200-2400  Protein:  110-130 gm  Fluid:  2.2-2.4 L    Lucas Mallow, RD, LDN,  CNSC Please refer to Amion for contact information.

## 2020-04-27 NOTE — Progress Notes (Signed)
[facility] Progress Note    Assessment/ Plan:   AKI: Likely secondary to acute tubular injury in the context of hypotension, worsening anemia -Baseline creatinine seems to be around 1 -Peak creatinine 3.1 on 10/21,  Nadir was 1.77.  Now trending worse again , I think due to hemodynamic reasons with rapid A fib- hopefully will stabilize- no dialysis needs    Hypernatremia - trending better, then stable-   Drinking water -  d5w was stopped.  Since stable will not resume   Metabolic acidosis -related to aki, oral bicarb is ordered - pretty stable  H/o HTN: intermittently hypotensive here, on rate ctrl for afib.   Anemia -Transfuse for Hgb<7 g/dL- added ESA- not helping the afib I am sure- consider giving blood  A. fib.  Currently on amio drip, anticoagulation resumed-  Cards- s/p cardioversion but then had recurrence-  repleting potassium- will give another dose today    Encephalopathy: ICU delirium & benzos, management per primary service. 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  UOP not well recorded, seems to be trending down.  Sodium better but crt up again from nadir.  He is confused again this AM    Objective:   BP 138/86   Pulse 100   Temp (!) 97.5 F (36.4 C)   Resp 19   Ht 6\' 3"  (1.905 m)   Wt 94.7 kg   SpO2 93%   BMI 26.10 kg/m   Intake/Output Summary (Last 24 hours) at 04/27/2020 0818 Last data filed at 04/27/2020 0745 Gross per 24 hour  Intake 1791.31 ml  Output 200 ml  Net 1591.31 ml   Weight change:   Physical Exam: Gen: alert but confused  HEENT: dry mucosal membranes QQP:YPPJK irreg Resp: diminished air entry right base, normal wob, bl chest expansion DTO:IZTI Ext: pitting edema to dep areas Neuro:  more confused  Imaging: DG Chest  Port 1 View  Result Date: 04/27/2020 CLINICAL DATA:  Pneumonia follow-up. EXAM: PORTABLE CHEST 1 VIEW COMPARISON:  04/26/2020. FINDINGS: Heart size stable. Stable tortuosity of the thoracic aorta. Diffuse scratched it bilateral pulmonary infiltrates are again noted without interim change. Tiny bilateral pleural effusions cannot be excluded. No pneumothorax. Chest is unchanged from prior exam. IMPRESSION: Persistent bilateral pulmonary infiltrates.  No interim change. Electronically Signed   By: Marcello Moores  Register   On: 04/27/2020 06:50   DG CHEST PORT 1 VIEW  Result Date: 04/26/2020 CLINICAL DATA:  Pneumonia, follow-up EXAM: PORTABLE CHEST 1 VIEW COMPARISON:  Portable exam 0539 hours compared to 04/23/2020 FINDINGS: Nasogastric tube removed. Normal heart size, mediastinal contours, and pulmonary vascularity. Atherosclerotic calcification aorta. Airspace infiltrates in the mid to lower lungs bilaterally greater on RIGHT, least affecting LEFT upper lobe. No pleural effusion or pneumothorax. Osseous structures unremarkable. IMPRESSION: Persistent BILATERAL pulmonary infiltrates consistent with multifocal pneumonia, slightly greater on RIGHT. Electronically Signed   By: Lavonia Dana M.D.   On: 04/26/2020 07:54   ECHO TEE  Result Date: 04/26/2020    TRANSESOPHOGEAL ECHO REPORT   Patient Name:   Carlos Olson Date of Exam: 04/26/2020 Medical Rec #:  [id number]      Height:       75.0 in Accession #:    [id number]     Weight:  208.8 lb Date of Birth:  02/24/1980     BSA:          2.235 m Patient Age:    79 years       BP:           132/70 mmHg Patient Gender: M              HR:           130 bpm. Exam Location:  Inpatient Procedure: Transesophageal Echo Indications:    I48.91* Unspeicified atrial fibrillation  History:        Patient has prior history of Echocardiogram examinations, most                 recent 04/13/2020. CAD, COPD; Signs/Symptoms:Shortness of                 Breath.  Sonographer:    Bernadene Person RDCS Referring Phys: [id number] Greer Ee [place] PROCEDURE: After discussion of the risks and benefits of a TEE, an informed consent was obtained from the patient. The transesophogeal probe was passed without difficulty through the esophogus of the patient. Local oropharyngeal anesthetic was provided with viscous lidocaine. Sedation performed by different physician. The patient was monitored while under deep sedation. Anesthestetic sedation was provided intravenously by Anesthesiology: 339.61mg  of Propofol. The patient developed no complications during the procedure. A successful direct current cardioversion was performed at 200 joules with 2 attempts. IMPRESSIONS  1. Left ventricular ejection fraction, by estimation, is 50 to 55%. The left ventricle has low normal function. The left ventricle has no regional wall motion abnormalities.  2. Right ventricular systolic function is normal. The right ventricular size is normal.  3. Left atrial size was moderately dilated. No left atrial/left atrial appendage thrombus was detected.  4. Right atrial size was mildly dilated.  5. The mitral valve is normal in structure. Trivial mitral valve regurgitation.  6. The aortic valve is tricuspid. There is mild calcification of the aortic valve. There is mild thickening of the aortic valve. Aortic valve regurgitation is trivial. Mild aortic valve sclerosis is present, with no evidence of aortic valve stenosis.  7. Moderate-to-severe plaque visualized in the descending aorta and aortic arch. Aortic dilatation noted. There is mild dilatation of the aortic root, measuring 40 mm. There is mild to moderate dilatation of the ascending aorta, measuring 41 mm.  8. Agitated saline contrast bubble study was positive with shunting observed within 3-6 cardiac cycles suggestive of interatrial shunt. FINDINGS  Left Ventricle: Left ventricular ejection fraction, by estimation, is 50 to 55%. The left ventricle has low normal function. The  left ventricle has no regional wall motion abnormalities. The left ventricular internal cavity size was normal in size. Right Ventricle: The right ventricular size is normal. Right vetricular wall thickness was not assessed. Right ventricular systolic function is normal. Left Atrium: Left atrial size was moderately dilated. No left atrial/left atrial appendage thrombus was detected. Right Atrium: Right atrial size was mildly dilated. Pericardium: There is no evidence of pericardial effusion. Mitral Valve: The mitral valve is normal in structure. There is mild thickening of the mitral valve leaflet(s). Mild mitral annular calcification. Trivial mitral valve regurgitation. Tricuspid Valve: The tricuspid valve is normal in structure. Tricuspid valve regurgitation is mild. Aortic Valve: The aortic valve is tricuspid. There is mild calcification of the aortic valve. There is mild thickening of the aortic valve. Aortic valve regurgitation is trivial. Mild aortic valve sclerosis is present, with no evidence of  aortic valve stenosis. Pulmonic Valve: The pulmonic valve was normal in structure. Pulmonic valve regurgitation is trivial. Aorta: Moderate-to-severe plaque visualized in the descending aorta and aortic arch. Aortic dilatation noted. There is mild dilatation of the aortic root, measuring 40 mm. There is mild to moderate dilatation of the ascending aorta, measuring 41 mm. IAS/Shunts: Agitated saline contrast was given intravenously to evaluate for intracardiac shunting. Agitated saline contrast bubble study was positive with shunting observed within 3-6 cardiac cycles suggestive of interatrial shunt.  LEFT VENTRICLE PLAX 2D LVOT diam:     2.95 cm LVOT Area:     6.83 cm   AORTA Ao Root diam: 4.50 cm  SHUNTS Systemic Diam: 2.95 cm Gwyndolyn Kaufman MD Electronically signed by Gwyndolyn Kaufman MD Signature Date/Time: 04/26/2020/2:52:08 PM    Final     Labs: BMET Recent Labs  Lab 04/22/20 1023 04/23/20 0202  04/23/20 1144 04/24/20 0439 04/25/20 0803 04/26/20 0037 04/27/20 0044  NA 153* 156* 154* 151* 149* 147* 147*  K 3.6 3.3* 3.3* 3.1* 3.0* 3.6 3.3*  CL 124* 125* 126* 121* 120* 119* 119*  CO2 18* 18* 20* 19* 18* 17* 19*  GLUCOSE 109* 112* 130* 134* 116* 115* 123*  BUN 53* 54* 47* 37* 37* 38* 45*  CREATININE 2.10* 2.06* 1.96* 1.77* 1.87* 2.24* 2.60*  CALCIUM 8.3* 8.2* 8.2* 7.7* 7.7* 7.7* 7.9*  PHOS 4.0  --   --   --   --   --   --    CBC Recent Labs  Lab 04/22/20 1023 04/23/20 0202 04/24/20 0439 04/25/20 0803 04/26/20 0037 04/27/20 0044  WBC 17.7*   < > 14.3* 17.1* 18.2* 16.0*  NEUTROABS 15.9*  --   --   --   --   --   HGB 9.3*   < > 9.0* 8.6* 7.9* 7.7*  HCT 28.7*   < > 27.5* 26.7* 24.7* 24.1*  MCV 97.6   < > 99.6 99.3 100.0 100.0  PLT 145*   < > 202 227 233 273   < > = values in this interval not displayed.    Medications:    . atorvastatin  10 mg Oral QHS  . chlorhexidine  15 mL Mouth Rinse BID  . Chlorhexidine Gluconate Cloth  6 each Topical Daily  . darbepoetin (ARANESP) injection - NON-DIALYSIS  100 mcg Subcutaneous Q Mon-1800  . feeding supplement  1 Container Oral TID BM  . fluticasone furoate-vilanterol  1 puff Inhalation Daily  . lidocaine  2 patch Transdermal Q24H  . mouth rinse  15 mL Mouth Rinse BID  . mouth rinse  15 mL Mouth Rinse q12n4p  . metoprolol tartrate  25 mg Oral BID  . pantoprazole (PROTONIX) IV  40 mg Intravenous Q24H  . risperiDONE  0.25 mg Sublingual BID  . sodium bicarbonate  1,300 mg Oral BID      Oliver Neuwirth A [facility] 04/27/2020, 8:18 AM

## 2020-04-27 NOTE — Progress Notes (Signed)
Progress Note   Subjective   Cardioverted yesterday successfully, but unable to maintain sinus for unknown reasons. Remains in afib - rates in the 120-130's.  Will have barium swallow today - may be able to transition to orals later. He is paranoid today -says he doesn't trust anyone, wants to get out of her and go shopping for physical therapy.  Inpatient Medications    Scheduled Meds: . atorvastatin  10 mg Oral QHS  . chlorhexidine  15 mL Mouth Rinse BID  . Chlorhexidine Gluconate Cloth  6 each Topical Daily  . darbepoetin (ARANESP) injection - NON-DIALYSIS  100 mcg Subcutaneous Q Mon-1800  . feeding supplement  1 Container Oral TID BM  . fluticasone furoate-vilanterol  1 puff Inhalation Daily  . lidocaine  2 patch Transdermal Q24H  . mouth rinse  15 mL Mouth Rinse BID  . mouth rinse  15 mL Mouth Rinse q12n4p  . metoprolol tartrate  25 mg Oral BID  . pantoprazole (PROTONIX) IV  40 mg Intravenous Q24H  . risperiDONE  0.25 mg Sublingual BID  . sodium bicarbonate  1,300 mg Oral BID   Continuous Infusions: . sodium chloride 250 mL (04/23/20 0343)  . amiodarone 30 mg/hr (04/27/20 0745)  . heparin 2,250 Units/hr (04/27/20 0745)   PRN Meds: acetaminophen **OR** acetaminophen, alum & mag hydroxide-simeth, bisacodyl, Gerhardt's butt cream, guaiFENesin-dextromethorphan, hydrALAZINE, HYDROmorphone (DILAUDID) injection, ondansetron, phenol, sodium phosphate   Vital Signs    Vitals:   04/27/20 0700 04/27/20 0722 04/27/20 0745 04/27/20 0749  BP: (!) 165/76  (!) 165/76   Pulse: (!) 121   (!) 108  Resp: 20   (!) 22  Temp:  (!) 97.5 F (36.4 C)    TempSrc:      SpO2: 92%     Weight:      Height:        Intake/Output Summary (Last 24 hours) at 04/27/2020 0802 Last data filed at 04/27/2020 0745 Gross per 24 hour  Intake 1791.31 ml  Output 200 ml  Net 1591.31 ml   Filed Weights   04/22/20 1507 04/24/20 0453 04/26/20 1104  Weight: 95.3 kg 94.7 kg 94.7 kg    Telemetry      Afib with RVR - Personally Reviewed  Physical Exam   General appearance: alert and no distress Neck: no carotid bruit, no JVD and thyroid not enlarged, symmetric, no tenderness/mass/nodules Lungs: diminished breath sounds bilaterally Heart: irregularly irregular rhythm and tachycardic Abdomen: large midline incision, ecchymosis Extremities: extremities normal, atraumatic, no cyanosis or edema Pulses: 2+ and symmetric Skin: pale, warm, dry Neurologic: Mental status: sedated, calm today Psych: Cannot assess  Labs    Chemistry Recent Labs  Lab 04/20/20 1955 04/21/20 0236 04/21/20 0850 04/21/20 1620 04/25/20 0803 04/26/20 0037 04/27/20 0044  NA 146*   < > 146*   < > 149* 147* 147*  K 3.9   < > 3.8   < > 3.0* 3.6 3.3*  CL 118*   < > 119*   < > 120* 119* 119*  CO2 17*   < > 18*   < > 18* 17* 19*  GLUCOSE 123*   < > 124*   < > 116* 115* 123*  BUN 49*   < > 53*   < > 37* 38* 45*  CREATININE 2.42*   < > 2.33*   < > 1.87* 2.24* 2.60*  CALCIUM 8.2*   < > 8.1*   < > 7.7* 7.7* 7.9*  PROT 4.6*  --  4.7*  --   --   --   --   ALBUMIN 2.0*  --  1.8*  --   --   --   --   AST 45*  --  40  --   --   --   --   ALT 21  --  18  --   --   --   --   ALKPHOS 48  --  49  --   --   --   --   BILITOT 0.8  --  0.7  --   --   --   --   GFRNONAA 26*   < > 28*   < > 36* 29* 24*  ANIONGAP 11   < > 9   < > 11 11 9    < > = values in this interval not displayed.     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  Assessment & Plan    1.  afib Rates remain elevated, especially with exertion. Echo showed LVEF 50% with moderate LAE and mild RAE Secondary to acute medical illness / delerium Continue IV amiodarone and IV heparin, plan to switch to orals later today if he passes swallow evaluation.  2. Acute renal failure Urine output improved (still net positive 10L) Nephrology notes reviewed ACEi on hold Creatinine continues to rise - 2.6 today (from 2.24)  3. HTN Stable, bp's improved  4. Delirium w/paranoia Continues - head CT negative for acute process Would continue risperidal  5. Thrombocytopenia/ anemia Hemoglobin stable around 9 Transfuse for hb <7 g/dl Platelets are improving  6. New infiltrate on CXR, ?aspiration - on Zosyn per PCCM, leukocytosis improving, evaluation for aspiration in process  Discussed findings with wife at the bedside  TIME SPENT WITH PATIENT: 35 minutes of direct patient care. More than 50% of that time was spent on coordination of care and counseling regarding afib, anticoagulation, AAA repair, AKI.  Pixie Casino, MD, [facility], [place] Director of the [facility] Diplomate of the American Board of Clinical Lipidology Attending Cardiologist  Direct Dial: [phone]  Fax: [phone]  Website:  [email or web address]  04/27/2020 8:02 AM

## 2020-04-27 NOTE — Progress Notes (Signed)
ANTICOAGULATION CONSULT NOTE - Follow Up Consult  Pharmacy Consult for IV Heparin Indication: atrial fibrillation  Allergies  Allergen Reactions  . Levaquin [Levofloxacin]     Aortic Aneurysm  . Sulfonamide Derivatives Itching  . Sulfasalazine Itching    Patient Measurements: Height: 6\' 3"  (190.5 cm) Weight: 94.7 kg (208 lb 12.4 oz) IBW/kg (Calculated) : 84.5 Heparin Dosing Weight: 95.3 kg   Vital Signs: Temp: 98.3 F (36.8 C) (10/29 0000) Temp Source: Oral (10/29 0000) BP: 131/82 (10/29 0100) Pulse Rate: 98 (10/29 0100)  Labs: Recent Labs    04/25/20 0803 04/25/20 0803 04/26/20 0037 04/26/20 1020 04/27/20 0044  HGB 8.6*   < > 7.9*  --  7.7*  HCT 26.7*  --  24.7*  --  24.1*  PLT 227  --  233  --  273  LABPROT  --   --   --  17.3*  --   INR  --   --   --  1.5*  --   HEPARINUNFRC 0.34   < > 0.27* 0.35 0.22*  CREATININE 1.87*  --  2.24*  --  2.60*   < > = values in this interval not displayed.    Estimated Creatinine Clearance: 27.1 mL/min (A) (by C-G formula based on SCr of 2.6 mg/dL (H)).  Assessment: 79 yr old male, S/P elective endovascular repair of abdominal aortic aneurysm on 10/18; has been having AKI (slowly improving) in addition to Afib. Pharmacy was consulted to dose IV heparin; pt was not on anticoagulants PTA.  Heparin level down to subtherapeutic (0.22) on gtt at 2050 units/hr. Hgb down to 7.7 - no overt bleeding. No issues with line reported per RN.  Goal of Therapy:  Heparin level 0.3-0.7 units/ml Monitor platelets by anticoagulation protocol: Yes   Plan:  Increase heparin infusion to 2250 units/hr Will f/u 8 hr heparin level  Sherlon Handing, PharmD, BCPS Please see amion for complete clinical pharmacist phone list 04/27/2020 1:50 AM

## 2020-04-27 NOTE — Progress Notes (Addendum)
Progress Note    04/27/2020 7:27 AM 1 Day Post-Op  Subjective:  No major complaints this morning. Thinks he is going home. Has had multiple BMs. Tolerated some soft foot last night   Vitals:   04/27/20 0700 04/27/20 0722  BP: (!) 165/76   Pulse: (!) 121   Resp: 20   Temp:  (!) 97.5 F (36.4 C)  SpO2: 92%    Physical Exam: Cardiac: irregularly irregular Lungs: non labored Incisions: right groin and laparotomy incision clean, dry and intact. Expected tenderness. No swelling or hematoma Extremities:  Extremities are well perfused and warm. Moving without deficits Abdomen:  Soft, expected tenderness along incision Neurologic: alert and oriented  CBC    Component Value Date/Time   WBC 16.0 (H) 04/27/2020 0044   RBC 2.41 (L) 04/27/2020 0044   HGB 7.7 (L) 04/27/2020 0044   HGB 12.1 (L) 08/20/2012 0610   HCT 24.1 (L) 04/27/2020 0044   HCT 44.0 08/02/2012 0853   PLT 273 04/27/2020 0044   PLT 158 08/20/2012 0610   MCV 100.0 04/27/2020 0044   MCV 99 08/02/2012 0853   MCH 32.0 04/27/2020 0044   MCHC 32.0 04/27/2020 0044   RDW 16.4 (H) 04/27/2020 0044   RDW 13.7 08/02/2012 0853   LYMPHSABS 0.5 (L) 04/22/2020 1023   MONOABS 1.1 (H) 04/22/2020 1023   EOSABS 0.0 04/22/2020 1023   BASOSABS 0.0 04/22/2020 1023    BMET    Component Value Date/Time   NA 147 (H) 04/27/2020 0044   NA 136 08/20/2012 0610   K 3.3 (L) 04/27/2020 0044   K 4.2 08/20/2012 0610   CL 119 (H) 04/27/2020 0044   CL 102 08/20/2012 0610   CO2 19 (L) 04/27/2020 0044   CO2 26 08/20/2012 0610   GLUCOSE 123 (H) 04/27/2020 0044   GLUCOSE 123 (H) 08/20/2012 0610   BUN 45 (H) 04/27/2020 0044   BUN 7 08/20/2012 0610   CREATININE 2.60 (H) 04/27/2020 0044   CREATININE 0.75 08/20/2012 0610   CALCIUM 7.9 (L) 04/27/2020 0044   CALCIUM 8.4 (L) 08/20/2012 0610   GFRNONAA 24 (L) 04/27/2020 0044   GFRNONAA >60 08/20/2012 0610   GFRAA >60 05/07/2017 0949   GFRAA >60 08/20/2012 0610    INR    Component  Value Date/Time   INR 1.5 (H) 04/26/2020 1020   INR 1.0 08/02/2012 0853     Intake/Output Summary (Last 24 hours) at 04/27/2020 0727 Last data filed at 04/27/2020 0700 Gross per 24 hour  Intake 1799.14 ml  Output 270 ml  Net 1529.14 ml     Assessment/Plan:  79 y.o. male is s/p aortobifemoral bypass 10 Days post op. Lower extremities well perfused and warm. Incisions are clean, dry and intact. Hgb trending down 7.7- no apparent bleeding and tolerating presently.   Neuro:Mental status continues to improve. Narcotics minimized Pulmonary: Remains on nasal cannula oxygen with good O2 sats.White blood cell count somewhat improved today. He remains on Zosyn. Encourage incentive spirometry.  X-ray shows worsening  Pneumonia R> L.  Will need to tailor antibiotics based on cultures which are still pending ID: afebrile. On Zosyn for pneumonia.  Will change antibiotics based on culture results- pending GI: Advance diet as tolerated. Not much appetite. Encourage supplements renal: Urine output remains adequate.Creatinine bumped up slightly today 2.6. We will continue to monitor. Hypernatremia: Sodium trending down A. fib: On heparin drip. Cardiology performed cardioversion yesterday, but appears to be back in Afib. On amiodarone IV   Will see if  PT/ OT can assess today. Possibly transfer out of ICU tomorrow. Will see what cardiology recommendations are prior to transfer    DVT prophylaxis:  Heparin gtt   Karoline Caldwell, PA-C [facility] [phone] 04/27/2020 7:27 AM   I have interviewed the patient and examined the patient. I agree with the findings by the PA.  With respect to his pneumonia, his white blood cell count has decreased slightly today, his chest x-ray shows persistent bilateral pulmonary infiltrates, blood cultures in 6 respiratory cultures are negative so far except for rare yeast.  Gae Gallop, MD [phone]

## 2020-04-27 NOTE — Progress Notes (Signed)
Pharmacy Antibiotic Note  Carlos Olson is a 79 y.o. male admitted on 04/17/2020 with pneumonia.  Pharmacy has been consulted for Vancomycin and Zosyn dosing.  Plan: Zosyn 3.375gm IV q8h Vancomycin 1750mg  IV now then 1000 mg IV Q 24 hrs.  Will f/u renal function, micro data, and pt's clinical condition Vanc levels prn   Height: 6\' 3"  (190.5 cm) Weight: 94.7 kg (208 lb 12.4 oz) IBW/kg (Calculated) : 84.5  Temp (24hrs), Avg:98.3 F (36.8 C), Min:97.5 F (36.4 C), Max:98.8 F (37.1 C)  Recent Labs  Lab 04/21/20 0850 04/21/20 1620 04/23/20 0202 04/23/20 0202 04/23/20 1144 04/24/20 0439 04/25/20 0803 04/26/20 0037 04/27/20 0044  WBC 16.1*   < > 16.2*  --   --  14.3* 17.1* 18.2* 16.0*  CREATININE 2.33*   < > 2.06*   < > 1.96* 1.77* 1.87* 2.24* 2.60*  LATICACIDVEN 1.0  --   --   --   --   --   --   --   --    < > = values in this interval not displayed.    Estimated Creatinine Clearance: 27.1 mL/min (A) (by C-G formula based on SCr of 2.6 mg/dL (H)).    Allergies  Allergen Reactions  . Levaquin [Levofloxacin]     Aortic Aneurysm  . Sulfonamide Derivatives Itching  . Sulfasalazine Itching    Antimicrobials this admission: Vanc 10/30>> Zosyn 10/22>>10/27; restart 10/30>> Augmentin 10/27  Microbiology results: 10/28 Bcx > 10/28 sputum >neg  Thank you for allowing pharmacy to be a part of this patient's care.  Sherlon Handing, PharmD, BCPS Please see amion for complete clinical pharmacist phone list 04/27/2020 11:44 PM

## 2020-04-27 NOTE — Progress Notes (Addendum)
Progress Note  Patient Name: EION TIMBROOK Date of Encounter: 04/28/2020  [facility] Cardiologist: Werner Lean, MD   Subjective  Patient is delirious this morning and refusing all medications. He is angry about still being here and wants to leave. Daughter is at bedside and trying to calm him down.  Cr improving to 2.44 from 2.6 Remains in Afib with RVR with HR 110-140s. Received amiodarone 150mg  IV bolus this AM Inpatient Medications    Scheduled Meds: . sodium chloride   Intravenous Once  . amiodarone  200 mg Oral BID  . apixaban  2.5 mg Oral BID  . atorvastatin  10 mg Oral QHS  . chlorhexidine  15 mL Mouth Rinse BID  . Chlorhexidine Gluconate Cloth  6 each Topical Daily  . darbepoetin (ARANESP) injection - NON-DIALYSIS  100 mcg Subcutaneous Q Mon-1800  . feeding supplement  237 mL Oral TID BM  . fluticasone furoate-vilanterol  1 puff Inhalation Daily  . lidocaine  2 patch Transdermal Q24H  . mouth rinse  15 mL Mouth Rinse q12n4p  . metoprolol tartrate  25 mg Oral BID  . pantoprazole  40 mg Oral Daily  . risperiDONE  0.25 mg Sublingual BID  . sodium bicarbonate  1,300 mg Oral BID   Continuous Infusions: . sodium chloride 250 mL (04/23/20 0343)  . fluconazole (DIFLUCAN) IV 200 mg (04/28/20 0960)  . piperacillin-tazobactam (ZOSYN)  IV 3.375 g (04/28/20 0113)  . vancomycin     PRN Meds: acetaminophen **OR** acetaminophen, alum & mag hydroxide-simeth, bisacodyl, Gerhardt's butt cream, guaiFENesin-dextromethorphan, hydrALAZINE, HYDROmorphone (DILAUDID) injection, ondansetron, phenol, sodium phosphate, traZODone   Vital Signs    Vitals:   04/28/20 0700 04/28/20 0717 04/28/20 0800 04/28/20 0811  BP: (!) 146/85  (!) 164/114   Pulse: (!) 134  (!) 117   Resp: (!) 29  (!) 23   Temp:  98.1 F (36.7 C)    TempSrc:      SpO2: (!) 81%  91% 92%  Weight:      Height:        Intake/Output Summary (Last 24 hours) at 04/28/2020 0815 Last data filed at  04/28/2020 0645 Gross per 24 hour  Intake 593.9 ml  Output 550 ml  Net 43.9 ml   Last 3 Weights 04/28/2020 04/26/2020 04/24/2020  Weight (lbs) (No Data) 208 lb 12.4 oz 208 lb 12.4 oz  Weight (kg) (No Data) 94.7 kg 94.7 kg      Telemetry    Afib with RVR with occasional PVCs - Personally Reviewed  ECG    No new ECGs - Personally Reviewed  Physical Exam   GEN: Delirious and suspicious this morning.   Neck: No JVD Cardiac: Tachycardic, irregular, no murmurs Respiratory: Rhonchorous breath sounds with frequent coughing GI: Soft, nontender, non-distended  MS: Warm, trace pedal edema Neuro:  Delirious and suspicious this morning.  Labs    High Sensitivity Troponin:  No results for input(s): TROPONINIHS in the last 720 hours.    Chemistry Recent Labs  Lab 04/21/20 0850 04/21/20 1620 04/26/20 0037 04/26/20 0037 04/27/20 0044 04/28/20 0328 04/28/20 0408  NA 146*   < > 147*   < > 147* 149* 150*  K 3.8   < > 3.6   < > 3.3* 3.5 3.2*  CL 119*   < > 119*  --  119* 121*  --   CO2 18*   < > 17*  --  19* 17*  --   GLUCOSE 124*   < >  115*  --  123* 102*  --   BUN 53*   < > 38*  --  45* 40*  --   CREATININE 2.33*   < > 2.24*  --  2.60* 2.44*  --   CALCIUM 8.1*   < > 7.7*  --  7.9* 8.1*  --   PROT 4.7*  --   --   --   --   --   --   ALBUMIN 1.8*  --   --   --   --   --   --   AST 40  --   --   --   --   --   --   ALT 18  --   --   --   --   --   --   ALKPHOS 49  --   --   --   --   --   --   BILITOT 0.7  --   --   --   --   --   --   GFRNONAA 28*   < > 29*  --  24* 26*  --   ANIONGAP 9   < > 11  --  9 11  --    < > = values in this interval not displayed.     Hematology Recent Labs  Lab 04/26/20 0037 04/26/20 0037 04/27/20 0044 04/28/20 0205 04/28/20 0408  WBC 18.2*  --  16.0* 15.6*  --   RBC 2.47*  --  2.41* 2.56*  --   HGB 7.9*   < > 7.7* 8.0* 7.8*  HCT 24.7*   < > 24.1* 25.4* 23.0*  MCV 100.0  --  100.0 99.2  --   MCH 32.0  --  32.0 31.3  --   MCHC 32.0  --   32.0 31.5  --   RDW 16.4*  --  16.4* 16.4*  --   PLT 233  --  273 309  --    < > = values in this interval not displayed.    BNPNo results for input(s): BNP, PROBNP in the last 168 hours.   DDimer No results for input(s): DDIMER in the last 168 hours.   Radiology    DG CHEST PORT 1 VIEW  Result Date: 04/28/2020 CLINICAL DATA:  Dyspnea EXAM: PORTABLE CHEST 1 VIEW COMPARISON:  04/27/2020 FINDINGS: Extensive airspace infiltrate within the right mid lung zone has progressed slightly in the interval since prior examination. Tiny right pleural effusion is likely present, unchanged from prior examination. Mild superimposed bibasilar atelectasis. No pneumothorax. Cardiac size within normal limits. Pulmonary vascularity is normal. No acute bone abnormality. IMPRESSION: Progressive right mid lung zone pulmonary infiltrate, likely infectious in the appropriate clinical setting. Electronically Signed   By: Fidela Salisbury MD   On: 04/28/2020 00:09   DG Chest Port 1 View  Result Date: 04/27/2020 CLINICAL DATA:  Pneumonia follow-up. EXAM: PORTABLE CHEST 1 VIEW COMPARISON:  04/26/2020. FINDINGS: Heart size stable. Stable tortuosity of the thoracic aorta. Diffuse scratched it bilateral pulmonary infiltrates are again noted without interim change. Tiny bilateral pleural effusions cannot be excluded. No pneumothorax. Chest is unchanged from prior exam. IMPRESSION: Persistent bilateral pulmonary infiltrates.  No interim change. Electronically Signed   By: Marcello Moores  Register   On: 04/27/2020 06:50   DG Swallowing Func-Speech Pathology  Result Date: 04/27/2020 Objective Swallowing Evaluation: Type of Study: MBS-Modified Barium Swallow Study  Patient Details Name: REINER LOEWEN MRN: [id number] Date of  Birth: [birth year] Today's Date: 04/27/2020 Time: SLP Start Time (ACUTE ONLY): 1440 -SLP Stop Time (ACUTE ONLY): 1458 SLP Time Calculation (min) (ACUTE ONLY): 18 min Past Medical History: Past Medical History: Diagnosis  Date . AAA (abdominal aortic aneurysm) ([place])  . Atherosclerosis of aortic arch ([place]) 09/25/2015  By CT scan  . CAD (coronary artery disease) 09/25/2015  Mild 3v by CT scan  . Cancer [facility])   prostate . Colon polyps  . COPD (chronic obstructive pulmonary disease) ([place])  . Diverticulosis of colon  . Femoral bruit   bilateral, found by Dr. Jefm Bryant . Heart murmur   child . History of prostate cancer  . IBS (irritable bowel syndrome)   improved after retirement . Osteoarthritis   knees and cervical/lumbar spine s/p surgeries and injections . Pelvic fracture ([place])  . Pneumonia   child . Pulmonary nodules 09/25/2015  On screening CT scan, rpt 1 yr  . Shortness of breath   doe Past Surgical History: Past Surgical History: Procedure Laterality Date . ANTERIOR CERVICAL DECOMP/DISCECTOMY FUSION  07/29/2011  Procedure: ANTERIOR CERVICAL DECOMPRESSION/DISCECTOMY FUSION 1 LEVEL;  Surgeon: Jessy Oto, MD;  Location: [place];  Service: Orthopedics;  Laterality: N/A;  Anterior Cervical Discectomy and Fusion C3-4 . AORTA - BILATERAL FEMORAL ARTERY BYPASS GRAFT N/A 04/17/2020  Procedure: AORTA BIFEMORAL BYPASS GRAFT USING A HEMASHIELD GOLD BIFURCATED GRAFT;  Surgeon: Waynetta Sandy, MD;  Location: [place];  Service: Vascular;  Laterality: N/A; . CATARACT EXTRACTION W/PHACO Right 07/10/2015  Procedure: CATARACT EXTRACTION PHACO AND INTRAOCULAR LENS PLACEMENT ([place]);  Surgeon: Birder Robson, MD;  Location: [facility];  Service: Ophthalmology;  Laterality: Right;  [place] 00:58  . CATARACT EXTRACTION W/PHACO Left 07/24/2015  Procedure: CATARACT EXTRACTION PHACO AND INTRAOCULAR LENS PLACEMENT ([facility]);  Surgeon: Birder Robson, MD;  Location: [facility];  Service: Ophthalmology;  Laterality: Left;  [place] 00:38  . COLONOSCOPY  12/2007  [place], diverticulosis and polyp, rpt 5 yrs . dental implants   . EMBOLIZATION Right 03/26/2020  Procedure: EMBOLIZATION;  Surgeon: Waynetta Sandy, MD;  Location: [facility];  Service:  Cardiovascular;  Laterality: Right; . FORAMINOTOMY 2 LEVEL Left 10/2013  C4/5, C5/6 ([place]) . HERNIA REPAIR    right 05/1999, left 09/04/05 . JOINT REPLACEMENT Right   TKR . KNEE ARTHROPLASTY Left 05/20/2017  Procedure: COMPUTER ASSISTED TOTAL KNEE ARTHROPLASTY;  Surgeon: Dereck Leep, MD;  Location: [facility];  Service: Orthopedics;  Laterality: Left; . KNEE ARTHROSCOPY  09/1997  left . POSTERIOR CERVICAL FUSION/FORAMINOTOMY N/A 11/22/2013  Procedure: LEFT C4-5 AND C5-6 FORAMINOTOMY;  Surgeon: Jessy Oto, MD;  Location: [place];  Service: Orthopedics;  Laterality: N/A; . PROSTATECTOMY  08/1990 . REPLACEMENT TOTAL KNEE Right 07/2012 . TONSILLECTOMY   HPI: DHIREN AZIMI is a 79 y.o. 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 CXR 04/23/20 revealed lower lobe opacification with trace pleural fluid. CXR 10/28: Persistent BILATERAL pulmonary infiltrates consistent with multifocal pneumonia, slightly greater on RIGHT.  No data recorded Assessment / Plan / Recommendation CHL IP CLINICAL IMPRESSIONS 04/27/2020 Clinical Impression Pt presented with mild oropharyngeal dysphagia characterized by reduced bolus propulsion, reduced lingual retraction, reduced pharyngeal constriction, and reduced anterior laryngeal movement. He exhibited difficulty with A-P transport of boluses and bolus transport was intermittently slowed through the pharynx. Bolus aggregation in the valleculae was noted with regular texture solids and pt denied sensation of the bolus stating  that he had swallowed it. The bolus was ultimately cleared with a liquid wash. He demonstrated vallecular residue, posterior pharyngeal wall residue, and pyriform sinus residue which was improved with liquid washes. No episodes of penetration/aspiration were noted during the study. It is recommended that the current diet of dysphagia 2 solids and thin liquids be continued  at this time with observance of swallowing precautions and SLP will follow briefly to ensure tolerance.  SLP Visit Diagnosis Dysphagia, pharyngeal phase (R13.13) Attention and concentration deficit following -- Frontal lobe and executive function deficit following -- Impact on safety and function Mild aspiration risk   CHL IP TREATMENT RECOMMENDATION 04/27/2020 Treatment Recommendations Therapy as outlined in treatment plan below   Prognosis 04/27/2020 Prognosis for Safe Diet Advancement Good Barriers to Reach Goals -- Barriers/Prognosis Comment -- CHL IP DIET RECOMMENDATION 04/27/2020 SLP Diet Recommendations Dysphagia 3 (Mech soft) solids;Thin liquid Liquid Administration via Cup;Straw Medication Administration Whole meds with liquid Compensations Slow rate;Small sips/bites;Minimize environmental distractions;Follow solids with liquid Postural Changes Remain semi-upright after after feeds/meals (Comment);Seated upright at 90 degrees   CHL IP OTHER RECOMMENDATIONS 04/27/2020 Recommended Consults -- Oral Care Recommendations Oral care BID Other Recommendations --   CHL IP FOLLOW UP RECOMMENDATIONS 04/27/2020 Follow up Recommendations Other (comment)   CHL IP FREQUENCY AND DURATION 04/27/2020 Speech Therapy Frequency (ACUTE ONLY) min 2x/week Treatment Duration 1 week      CHL IP ORAL PHASE 04/27/2020 Oral Phase Impaired Oral - Pudding Teaspoon -- Oral - Pudding Cup -- Oral - Honey Teaspoon -- Oral - Honey Cup -- Oral - Nectar Teaspoon -- Oral - Nectar Cup -- Oral - Nectar Straw -- Oral - Thin Teaspoon -- Oral - Thin Cup Reduced posterior propulsion Oral - Thin Straw Reduced posterior propulsion Oral - Puree Reduced posterior propulsion Oral - Mech Soft Reduced posterior propulsion Oral - Regular Reduced posterior propulsion Oral - Multi-Consistency -- Oral - Pill -- Oral Phase - Comment --  CHL IP PHARYNGEAL PHASE 04/27/2020 Pharyngeal Phase Impaired Pharyngeal- Pudding Teaspoon -- Pharyngeal -- Pharyngeal- Pudding  Cup -- Pharyngeal -- Pharyngeal- Honey Teaspoon -- Pharyngeal -- Pharyngeal- Honey Cup -- Pharyngeal -- Pharyngeal- Nectar Teaspoon -- Pharyngeal -- Pharyngeal- Nectar Cup -- Pharyngeal -- Pharyngeal- Nectar Straw -- Pharyngeal -- Pharyngeal- Thin Teaspoon -- Pharyngeal -- Pharyngeal- Thin Cup Reduced tongue base retraction;Reduced anterior laryngeal mobility;Pharyngeal residue - valleculae;Pharyngeal residue - posterior pharnyx;Pharyngeal residue - pyriform;Reduced pharyngeal peristalsis Pharyngeal -- Pharyngeal- Thin Straw Reduced tongue base retraction;Reduced anterior laryngeal mobility;Pharyngeal residue - valleculae;Pharyngeal residue - posterior pharnyx;Pharyngeal residue - pyriform;Reduced pharyngeal peristalsis Pharyngeal -- Pharyngeal- Puree Reduced tongue base retraction;Reduced anterior laryngeal mobility;Pharyngeal residue - valleculae;Pharyngeal residue - posterior pharnyx;Pharyngeal residue - pyriform;Reduced pharyngeal peristalsis Pharyngeal -- Pharyngeal- Mechanical Soft Reduced tongue base retraction;Reduced anterior laryngeal mobility;Pharyngeal residue - valleculae;Pharyngeal residue - posterior pharnyx;Pharyngeal residue - pyriform;Reduced pharyngeal peristalsis Pharyngeal -- Pharyngeal- Regular Reduced tongue base retraction;Reduced anterior laryngeal mobility;Pharyngeal residue - valleculae;Pharyngeal residue - posterior pharnyx;Pharyngeal residue - pyriform;Reduced pharyngeal peristalsis Pharyngeal -- Pharyngeal- Multi-consistency -- Pharyngeal -- Pharyngeal- Pill Reduced tongue base retraction;Reduced anterior laryngeal mobility;Pharyngeal residue - valleculae;Pharyngeal residue - posterior pharnyx;Pharyngeal residue - pyriform;Reduced pharyngeal peristalsis Pharyngeal -- Pharyngeal Comment --  CHL IP CERVICAL ESOPHAGEAL PHASE 04/27/2020 Cervical Esophageal Phase WFL Pudding Teaspoon -- Pudding Cup -- Honey Teaspoon -- Honey Cup -- Nectar Teaspoon -- Nectar Cup -- Nectar Straw -- Thin  Teaspoon -- Thin Cup -- Thin Straw -- Puree -- Mechanical Soft -- Regular -- Multi-consistency -- Pill -- Cervical Esophageal Comment -- Shanika I. Hardin Negus,  MS, [place] Office number [phone] Pager [place] 04/27/2020, 3:36 PM              ECHO TEE  Result Date: 04/26/2020    TRANSESOPHOGEAL ECHO REPORT   Patient Name:   BENFORD ASCH Date of Exam: 04/26/2020 Medical Rec #:  [id number]      Height:       75.0 in Accession #:    [id number]     Weight:       208.8 lb Date of Birth:  Oct 27, 1940     BSA:          2.235 m Patient Age:    79 years       BP:           132/70 mmHg Patient Gender: M              HR:           130 bpm. Exam Location:  Inpatient Procedure: Transesophageal Echo Indications:    I48.91* Unspeicified atrial fibrillation  History:        Patient has prior history of Echocardiogram examinations, most                 recent 04/13/2020. CAD, COPD; Signs/Symptoms:Shortness of                 Breath.  Sonographer:    Bernadene Person RDCS Referring Phys: [id number] Greer Ee Tarini Carrier PROCEDURE: After discussion of the risks and benefits of a TEE, an informed consent was obtained from the patient. The transesophogeal probe was passed without difficulty through the esophogus of the patient. Local oropharyngeal anesthetic was provided with viscous lidocaine. Sedation performed by different physician. The patient was monitored while under deep sedation. Anesthestetic sedation was provided intravenously by Anesthesiology: 339.61mg  of Propofol. The patient developed no complications during the procedure. A successful direct current cardioversion was performed at 200 joules with 2 attempts. IMPRESSIONS  1. Left ventricular ejection fraction, by estimation, is 50 to 55%. The left ventricle has low normal function. The left ventricle has no regional wall motion abnormalities.  2. Right ventricular systolic function is normal. The right ventricular size  is normal.  3. Left atrial size was moderately dilated. No left atrial/left atrial appendage thrombus was detected.  4. Right atrial size was mildly dilated.  5. The mitral valve is normal in structure. Trivial mitral valve regurgitation.  6. The aortic valve is tricuspid. There is mild calcification of the aortic valve. There is mild thickening of the aortic valve. Aortic valve regurgitation is trivial. Mild aortic valve sclerosis is present, with no evidence of aortic valve stenosis.  7. Moderate-to-severe plaque visualized in the descending aorta and aortic arch. Aortic dilatation noted. There is mild dilatation of the aortic root, measuring 40 mm. There is mild to moderate dilatation of the ascending aorta, measuring 41 mm.  8. Agitated saline contrast bubble study was positive with shunting observed within 3-6 cardiac cycles suggestive of interatrial shunt. FINDINGS  Left Ventricle: Left ventricular ejection fraction, by estimation, is 50 to 55%. The left ventricle has low normal function. The left ventricle has no regional wall motion abnormalities. The left ventricular internal cavity size was normal in size. Right Ventricle: The right ventricular size is normal. Right vetricular wall thickness was not assessed. Right ventricular systolic function is normal. Left Atrium: Left atrial size was moderately dilated. No left atrial/left atrial appendage thrombus was detected. Right Atrium: Right atrial size  was mildly dilated. Pericardium: There is no evidence of pericardial effusion. Mitral Valve: The mitral valve is normal in structure. There is mild thickening of the mitral valve leaflet(s). Mild mitral annular calcification. Trivial mitral valve regurgitation. Tricuspid Valve: The tricuspid valve is normal in structure. Tricuspid valve regurgitation is mild. Aortic Valve: The aortic valve is tricuspid. There is mild calcification of the aortic valve. There is mild thickening of the aortic valve. Aortic valve  regurgitation is trivial. Mild aortic valve sclerosis is present, with no evidence of aortic valve stenosis. Pulmonic Valve: The pulmonic valve was normal in structure. Pulmonic valve regurgitation is trivial. Aorta: Moderate-to-severe plaque visualized in the descending aorta and aortic arch. Aortic dilatation noted. There is mild dilatation of the aortic root, measuring 40 mm. There is mild to moderate dilatation of the ascending aorta, measuring 41 mm. IAS/Shunts: Agitated saline contrast was given intravenously to evaluate for intracardiac shunting. Agitated saline contrast bubble study was positive with shunting observed within 3-6 cardiac cycles suggestive of interatrial shunt.  LEFT VENTRICLE PLAX 2D LVOT diam:     2.95 cm LVOT Area:     6.83 cm   AORTA Ao Root diam: 4.50 cm  SHUNTS Systemic Diam: 2.95 cm Gwyndolyn Kaufman MD Electronically signed by Gwyndolyn Kaufman MD Signature Date/Time: 04/26/2020/2:52:08 PM    Final     Cardiac Studies   TEE 04/26/20: IMPRESSIONS  1. Left ventricular ejection fraction, by estimation, is 50 to 55%. The  left ventricle has low normal function. The left ventricle has no regional  wall motion abnormalities.  2. Right ventricular systolic function is normal. The right ventricular  size is normal.  3. Left atrial size was moderately dilated. No left atrial/left atrial  appendage thrombus was detected.  4. Right atrial size was mildly dilated.  5. The mitral valve is normal in structure. Trivial mitral valve  regurgitation.  6. The aortic valve is tricuspid. There is mild calcification of the  aortic valve. There is mild thickening of the aortic valve. Aortic valve  regurgitation is trivial. Mild aortic valve sclerosis is present, with no  evidence of aortic valve stenosis.  7. Moderate-to-severe plaque visualized in the descending aorta and  aortic arch. Aortic dilatation noted. There is mild dilatation of the  aortic root, measuring 40 mm.  There is mild to moderate dilatation of the  ascending aorta, measuring 41 mm.  8. Agitated saline contrast bubble study was positive with shunting  observed within 3-6 cardiac cycles suggestive of interatrial shunt.   TTE 04/13/20: IMPRESSIONS  1. Left ventricular ejection fraction, by estimation, is 50%. The left  ventricle has mildly decreased function. The left ventricle demonstrates  global hypokinesis. Left ventricular diastolic parameters are consistent  with Grade I diastolic dysfunction  (impaired relaxation).  2. Right ventricular systolic function is normal. The right ventricular  size is normal. There is normal pulmonary artery systolic pressure. The  estimated right ventricular systolic pressure is 44.9 mmHg.  3. Left atrial size was moderately dilated.  4. Right atrial size was mildly dilated.  5. The mitral valve is normal in structure. No evidence of mitral valve  regurgitation. No evidence of mitral stenosis.  6. The aortic valve is tricuspid. Aortic valve regurgitation is trivial.  Mild aortic valve sclerosis is present, with no evidence of aortic valve  stenosis.  7. The inferior vena cava is normal in size with greater than 50%  respiratory variability, suggesting right atrial pressure of 3 mmHg.   Patient Profile  79 y.o. 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  Assessment & Plan    #Afib Poorly controlled especially with exertion. Underwent DCCV 10/28 with initial return to NSR, however, converted back into Afib with RVR shortly thereafter. Refusing medications this morning -TTE with LVEF 50% with moderate LAE and mild RAE -If not able to give PO medications as patient refusing, can switch back to IV amiodarone and from apxiaban to heparin. Daughter  is going to try to have him take his medications this AM. -Continue metoprolol 25mg  BID for additional rate control-->may switch to IV as above -Rate will be difficult to control in the setting of agitation and underlying suspected aspiration pneumonia-->consider PCCM consult   #Aspiration Pneumonia: -Continue vanc and zosyn -Fluc added due to yeast in sputum per vascular -Repeat cultures per vascular -Followed by speech  #Acute renal failure: Improving with Cr down to 2.44 today -Nephrology following -Holding ACEi  #HTN: Elevated this morning in the setting of agitation and refusing PO medications -Manage delirium per primary team -Can add BP meds as tolerated once taking PO  #Delirium with paranoia: CT head negative for acute process. Very agitated and delirious this morning. Refusing PO medications. -Continue to try to regularize sleep-wake cycle -Trazodone prn at night -Family at bedside  #AAA s/p open abdominal aortic aneurysm repair: -Management per vascular surgery -Continue statin -No ASA given need for [facility]  #Thrombocytopenia/anemia -HgB 7.8; planned for 2 units pRBC per pulmonary -Watch volume status with transfusion; may need lasix if significantly net positive for the day  Total time of encounter: 35 minutes total time of encounter, including > 50% of time spent in face-to-face patient care on the date of this encounter. This time includes coordination of care and counseling regarding above mentioned problem list. Remainder of non-face-to-face time involved reviewing chart documents/testing relevant to the patient encounter and documentation in the medical record. I have independently reviewed documentation from referring provider.   Gwyndolyn Kaufman, MD [place]       For questions or updates, please contact [place] Please consult [email or web address] for contact info under        Signed, Freada Bergeron, MD  04/28/2020, 8:15 AM

## 2020-04-27 NOTE — Progress Notes (Signed)
Physical Therapy Treatment Patient Details Name: Carlos Olson MRN: [id number] DOB: 1940-11-22 Today's Date: 04/27/2020    History of Present Illness Pt admit with abdominal aortic aneurysm, bilateral common iliac artery aneurysms, right hypogastric artery aneurysm, right common femoral artery aneurysm.  Underwent aortobifemoral BPG.      PT Comments    Pt admitted with above diagnosis. Pt was able to ambulate with Harmon Pier walker 10 feet with +2 min to mod assist overall for safety.  Pt limited by incr HR as well as confusion and frequent and loose stools. Did use diaper so that pt could progress ambulation however could not leave pt in chair as he kept having BM. Assisted nurse with getting pt back to bed and with cleaning pt.  Will continue to follow acutely.  Pt currently with functional limitations due to balance and endurance deficits. Pt will benefit from skilled PT to increase their independence and safety with mobility to allow discharge to the venue listed below.     Follow Up Recommendations  Supervision/Assistance - 24 hour;CIR     Equipment Recommendations  3in1 (PT)    Recommendations for Other Services       Precautions / Restrictions Precautions Precautions: Fall Precaution Comments: 02 Restrictions Weight Bearing Restrictions: No    Mobility  Bed Mobility Overal bed mobility: Needs Assistance Bed Mobility: Supine to Sit Rolling: Mod assist Sidelying to sit: +2 for physical assistance;Mod assist Supine to sit: Mod assist;+2 for physical assistance     General bed mobility comments: Had to clean pt initially with total assist prior to getting pt up.  Placed daiper on as pt with loose stools.  Pt needed assisted LEs over EOB, pt pulled up on therapist's hand to raise trunk  Transfers Overall transfer level: Needs assistance Equipment used: Rolling walker (2 wheeled) Transfers: Sit to/from [facility] Sit to Stand: Mod assist;+2 physical  assistance;Max assist;From elevated surface Stand pivot transfers: +2 safety/equipment;Min assist;From elevated surface;Mod assist       General transfer comment:  Pt allowed to pull up on Eva walker and on RW, assist of bed pad to raise hips, assist to steady. Pt walked a few steps and then sat in recliner with assist to control descent.  Ended up getting pt back in bed due to continued loose stools and after pt kept stooling upon standing to clean him, therefore assisted pt back to bed.  Max cues to move LEs to step around to the chair to get back into bed. Pt did use the RW to pivot back to bed.   Ambulation/Gait Ambulation/Gait assistance: Min assist;+2 safety/equipment Gait Distance (Feet): 10 Feet Assistive device:  (Eva walker) Gait Pattern/deviations: Step-through pattern;Decreased stride length;Decreased step length - right;Decreased step length - left;Shuffle;Drifts right/left;Trunk flexed   Gait velocity interpretation: <1.31 ft/sec, indicative of household ambulator General Gait Details: Pt was able to take a few steps with Harmon Pier walker and pt then asked to sit.  He stated, "It is weakness but also i worry because i am having a BM."    Pt did ambulate well with Harmon Pier walker.     Stairs             Wheelchair Mobility    Modified Rankin (Stroke Patients Only)       Balance Overall balance assessment: Needs assistance Sitting-balance support: No upper extremity supported;Feet supported Sitting balance-Leahy Scale: Fair     Standing balance support: Bilateral upper extremity supported Standing balance-Leahy Scale: Poor Standing balance comment: reliant  on RW vs. Harmon Pier walker as well as need for external support                            Cognition Arousal/Alertness: Awake/alert Behavior During Therapy: Flat affect Overall Cognitive Status: Impaired/Different from baseline Area of Impairment: Following commands;Memory;Attention;Problem solving                    Current Attention Level: Sustained Memory: Decreased short-term memory Following Commands: Follows one step commands with increased time     Problem Solving: Slow processing;Decreased initiation;Difficulty sequencing;Requires verbal cues General Comments: pt joking, sarcastic      Exercises General Exercises - Lower Extremity Ankle Circles/Pumps: AROM;Both;10 reps;Seated Long Arc Quad: AROM;Both;10 reps;Seated    General Comments General comments (skin integrity, edema, etc.): HR to 143 bpm from 98bpm at rest. Other VSS with pt on 6LO2      Pertinent Vitals/Pain Pain Assessment: Faces Faces Pain Scale: Hurts even more Pain Location: groin bilaterally, abdomen Pain Descriptors / Indicators: Aching;Grimacing;Guarding Pain Intervention(s): Limited activity within patient's tolerance;Monitored during session;Repositioned    Home Living                      Prior Function            PT Goals (current goals can now be found in the care plan section) Acute Rehab PT Goals Patient Stated Goal: to go home Progress towards PT goals: Progressing toward goals    Frequency    Min 3X/week      PT Plan Current plan remains appropriate    Co-evaluation              AM-PAC PT "6 Clicks" Mobility   Outcome Measure  Help needed turning from your back to your side while in a flat bed without using bedrails?: A Lot Help needed moving from lying on your back to sitting on the side of a flat bed without using bedrails?: A Lot Help needed moving to and from a bed to a chair (including a wheelchair)?: A Lot Help needed standing up from a chair using your arms (e.g., wheelchair or bedside chair)?: A Lot Help needed to walk in hospital room?: A Lot Help needed climbing 3-5 steps with a railing? : Total 6 Click Score: 11    End of Session Equipment Utilized During Treatment: Gait belt;Oxygen Activity Tolerance: Patient limited by fatigue Patient left:  with call bell/phone within reach;with family/visitor present;in bed;with nursing/sitter in room Nurse Communication: Mobility status PT Visit Diagnosis: Muscle weakness (generalized) (M62.81);Pain Pain - part of body:  (back and groin)     Time: 1420-1510 PT Time Calculation (min) (ACUTE ONLY): 50 min  Charges:  $Gait Training: 8-22 mins $Therapeutic Activity: 8-22 mins $Self Care/Home Management: 8-22                     Miachel Nardelli W,PT Acute Rehabilitation Services Pager:  [phone]  Office:  [place] 04/27/2020, 4:36 PM

## 2020-04-28 DIAGNOSIS — I4819 Other persistent atrial fibrillation: Secondary | ICD-10-CM | POA: Diagnosis not present

## 2020-04-28 LAB — POCT I-STAT 7, (LYTES, BLD GAS, ICA,H+H)
Acid-base deficit: 7 mmol/L — ABNORMAL HIGH (ref 0.0–2.0)
Bicarbonate: 17.1 mmol/L — ABNORMAL LOW (ref 20.0–28.0)
Calcium, Ion: 1.22 mmol/L (ref 1.15–1.40)
HCT: 23 % — ABNORMAL LOW (ref 39.0–52.0)
Hemoglobin: 7.8 g/dL — ABNORMAL LOW (ref 13.0–17.0)
O2 Saturation: 88 %
Patient temperature: 99.4
Potassium: 3.2 mmol/L — ABNORMAL LOW (ref 3.5–5.1)
Sodium: 150 mmol/L — ABNORMAL HIGH (ref 135–145)
TCO2: 18 mmol/L — ABNORMAL LOW (ref 22–32)
pCO2 arterial: 27.6 mmHg — ABNORMAL LOW (ref 32.0–48.0)
pH, Arterial: 7.403 (ref 7.350–7.450)
pO2, Arterial: 55 mmHg — ABNORMAL LOW (ref 83.0–108.0)

## 2020-04-28 LAB — CBC
HCT: 25.4 % — ABNORMAL LOW (ref 39.0–52.0)
Hemoglobin: 8 g/dL — ABNORMAL LOW (ref 13.0–17.0)
MCH: 31.3 pg (ref 26.0–34.0)
MCHC: 31.5 g/dL (ref 30.0–36.0)
MCV: 99.2 fL (ref 80.0–100.0)
Platelets: 309 10*3/uL (ref 150–400)
RBC: 2.56 MIL/uL — ABNORMAL LOW (ref 4.22–5.81)
RDW: 16.4 % — ABNORMAL HIGH (ref 11.5–15.5)
WBC: 15.6 10*3/uL — ABNORMAL HIGH (ref 4.0–10.5)
nRBC: 0 % (ref 0.0–0.2)

## 2020-04-28 LAB — BASIC METABOLIC PANEL
Anion gap: 11 (ref 5–15)
BUN: 40 mg/dL — ABNORMAL HIGH (ref 8–23)
CO2: 17 mmol/L — ABNORMAL LOW (ref 22–32)
Calcium: 8.1 mg/dL — ABNORMAL LOW (ref 8.9–10.3)
Chloride: 121 mmol/L — ABNORMAL HIGH (ref 98–111)
Creatinine, Ser: 2.44 mg/dL — ABNORMAL HIGH (ref 0.61–1.24)
GFR, Estimated: 26 mL/min — ABNORMAL LOW (ref >60)
Glucose, Bld: 102 mg/dL — ABNORMAL HIGH (ref 70–99)
Potassium: 3.5 mmol/L (ref 3.5–5.1)
Sodium: 149 mmol/L — ABNORMAL HIGH (ref 135–145)

## 2020-04-28 LAB — PREPARE RBC (CROSSMATCH)

## 2020-04-28 LAB — CULTURE, RESPIRATORY W GRAM STAIN

## 2020-04-28 MED ORDER — AMIODARONE IV BOLUS ONLY 150 MG/100ML: 150.0000 mg | Freq: Once | INTRAVENOUS | Status: DC

## 2020-04-28 MED ORDER — AMIODARONE IV BOLUS ONLY 150 MG/100ML
150.0000 mg | Freq: Once | INTRAVENOUS | Status: AC
  Administered 2020-04-28: 150 mg via INTRAVENOUS
  Filled 2020-04-28: qty 100

## 2020-04-28 MED ORDER — SODIUM CHLORIDE 0.9% IV SOLUTION: Freq: Once | INTRAVENOUS | Status: AC

## 2020-04-28 MED ORDER — FUROSEMIDE 10 MG/ML IJ SOLN
40.0000 mg | Freq: Once | INTRAMUSCULAR | Status: AC
  Administered 2020-04-28: 40 mg via INTRAVENOUS
  Filled 2020-04-28: qty 4

## 2020-04-28 MED ORDER — METOPROLOL TARTRATE 5 MG/5ML IV SOLN
10.0000 mg | Freq: Once | INTRAVENOUS | Status: AC
  Administered 2020-04-28: 10 mg via INTRAVENOUS
  Filled 2020-04-28: qty 10

## 2020-04-28 MED ORDER — TRAZODONE HCL 50 MG PO TABS
50.0000 mg | ORAL_TABLET | Freq: Every evening | ORAL | Status: DC | PRN
  Administered 2020-04-28 – 2020-04-30 (×2): 50 mg via ORAL
  Filled 2020-04-28 (×2): qty 1

## 2020-04-28 MED ORDER — DEXTROSE 5 % IV SOLN: INTRAVENOUS | Status: DC

## 2020-04-28 MED ORDER — METOPROLOL TARTRATE 5 MG/5ML IV SOLN: 10.0000 mg | Freq: Once | INTRAVENOUS | Status: DC

## 2020-04-28 MED ORDER — FLUCONAZOLE IN SODIUM CHLORIDE 200-0.9 MG/100ML-% IV SOLN
200.0000 mg | INTRAVENOUS | Status: DC
  Administered 2020-04-28 – 2020-05-02 (×6): 200 mg via INTRAVENOUS
  Filled 2020-04-28 (×7): qty 100

## 2020-04-28 MED ORDER — POTASSIUM CHLORIDE 20 MEQ PO PACK
40.0000 meq | PACK | Freq: Once | ORAL | Status: AC
  Administered 2020-04-28: 40 meq via ORAL
  Filled 2020-04-28: qty 2

## 2020-04-28 NOTE — Progress Notes (Signed)
RT NOTES: Patient refuses to do flutter at this time

## 2020-04-28 NOTE — Progress Notes (Signed)
   VASCULAR SURGERY ASSESSMENT & PLAN:   S/P OPEN ABDOMINAL AORTIC ANEURYSM REPAIR: Patient is making steady progress.  Currently his pulmonary status is the primary issue.    PULMONARY: His chest x-ray shows progressive right midlung pulmonary infiltrate likely infectious.  He is on vancomycin and Zosyn.  Given that he had rare yeast I will add fluconazole and repeat his cultures.   CARDIAC: He was cardioverted but went back into rapid atrial fibrillation.  Cardiology is following.  RENAL: His renal function had improved but now creatinine is increasing slightly.  Nephrology feels this might be secondary to hemodynamic reasons given his issues with rapid A. fib.  GI NUTRITION: He is tolerating regular diet.  ID: T (max) 99.4 his white blood cell count is stable at 15.6.  His blood cultures are negative so far.  His final respiratory culture is pending.  So far this had few yeast.  He is on intravenous Zosyn and intravenous vancomycin.  I will add fluconazole and repeat his sputum cultures.  ANEMIA: His hemoglobin is stable at 7.8.  Given his cardiac issues I will transfuse him today.  DVT PROPHYLAXIS: He is on Eliquis  SUBJECTIVE:   No specific complaints.  He tells me that he is tolerating a regular diet.  PHYSICAL EXAM:   Vitals:   04/28/20 0030 04/28/20 0100 04/28/20 0130 04/28/20 0347  BP:  140/86    Pulse: (!) 134 (!) 130 (!) 130   Resp: (!) 25 (!) 27 (!) 29   Temp:    99.4 F (37.4 C)  TempSrc:    Axillary  SpO2: 95% 95% 95%   Weight:      Height:       Abdomen normal pitched bowel sounds Incisions all look fine Risk Doppler signals in the dorsalis pedis and posterior tibial positions bilaterally. Lungs rhonchi bilaterally.  LABS:   Lab Results  Component Value Date   WBC 15.6 (H) 04/28/2020   HGB 7.8 (L) 04/28/2020   HCT 23.0 (L) 04/28/2020   MCV 99.2 04/28/2020   PLT 309 04/28/2020   Lab Results  Component Value Date   CREATININE 2.44 (H) 04/28/2020    Lab Results  Component Value Date   INR 1.5 (H) 04/26/2020    PROBLEM LIST:    Active Problems:   AAA (abdominal aortic aneurysm) (HCC)   ARF (acute renal failure) (HCC)   CURRENT MEDS:   . amiodarone  200 mg Oral BID  . apixaban  2.5 mg Oral BID  . atorvastatin  10 mg Oral QHS  . chlorhexidine  15 mL Mouth Rinse BID  . Chlorhexidine Gluconate Cloth  6 each Topical Daily  . darbepoetin (ARANESP) injection - NON-DIALYSIS  100 mcg Subcutaneous Q Mon-1800  . feeding supplement  237 mL Oral TID BM  . fluticasone furoate-vilanterol  1 puff Inhalation Daily  . lidocaine  2 patch Transdermal Q24H  . mouth rinse  15 mL Mouth Rinse q12n4p  . metoprolol tartrate  25 mg Oral BID  . pantoprazole  40 mg Oral Daily  . risperiDONE  0.25 mg Sublingual BID  . sodium bicarbonate  1,300 mg Oral BID    Deitra Mayo Office: [phone] 04/28/2020

## 2020-04-28 NOTE — Progress Notes (Signed)
[facility] Progress Note    Assessment/ Plan:   AKI: Likely secondary to acute tubular injury in the context of hypotension, worsening anemia -Baseline creatinine seems to be around 1 -Peak creatinine 3.1 on 10/21,  Nadir was 1.77 on 10/26.  Now trending worse again , I think due to hemodynamic reasons with rapid A fib- hopefully will stabilize- no dialysis needs-   A little better today     Hypernatremia - trending better, then stable-   Drinking water -  d5w was stopped.  Now trending up again-  Cont to encourage water but will add back some Q8G  Metabolic acidosis -related to aki, oral bicarb is ordered - pretty stable  H/o HTN: intermittently hypotensive here, on rate ctrl for afib.   Anemia -Transfuse for Hgb<7 g/dL- added ESA- not helping the afib I am sure- consider giving blood  A. fib.  Currently on amio, anticoagulation resumed-  Cards- s/p cardioversion but then had recurrence-  repleting potassium- will give another dose today    Encephalopathy: ICU delirium & benzos, management per primary service. 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  UOP not well recorded, crt a little better today     Objective:   BP (!) 164/114 (BP Location: Right Arm)   Pulse (!) 117   Temp 98.1 F (36.7 C)   Resp (!) 23   Ht 6\' 3"  (1.905 m)   Wt 94.7 kg   SpO2 92%   BMI 26.10 kg/m   Intake/Output Summary (Last 24 hours) at 04/28/2020 5003 Last data filed at 04/28/2020 0800 Gross per 24 hour  Intake 635.98 ml  Output 550 ml  Net 85.98 ml   Weight change:   Physical Exam: Gen: alert but confused  HEENT: dry mucosal membranes BCW:UGQBV irreg Resp: diminished air entry right base, normal wob, bl chest expansion QXI:HWTU Ext: pitting edema to dep  areas Neuro:  more confused  Imaging: DG CHEST PORT 1 VIEW  Result Date: 04/28/2020 CLINICAL DATA:  Dyspnea EXAM: PORTABLE CHEST 1 VIEW COMPARISON:  04/27/2020 FINDINGS: Extensive airspace infiltrate within the right mid lung zone has progressed slightly in the interval since prior examination. Tiny right pleural effusion is likely present, unchanged from prior examination. Mild superimposed bibasilar atelectasis. No pneumothorax. Cardiac size within normal limits. Pulmonary vascularity is normal. No acute bone abnormality. IMPRESSION: Progressive right mid lung zone pulmonary infiltrate, likely infectious in the appropriate clinical setting. Electronically Signed   By: Fidela Salisbury MD   On: 04/28/2020 00:09   DG Chest Port 1 View  Result Date: 04/27/2020 CLINICAL DATA:  Pneumonia follow-up. EXAM: PORTABLE CHEST 1 VIEW COMPARISON:  04/26/2020. FINDINGS: Heart size stable. Stable tortuosity of the thoracic aorta. Diffuse scratched it bilateral pulmonary infiltrates are again noted without interim change. Tiny bilateral pleural effusions cannot be excluded. No pneumothorax. Chest is unchanged from prior exam. IMPRESSION: Persistent bilateral pulmonary infiltrates.  No interim change. Electronically Signed   By: Marcello Moores  Register   On: 04/27/2020 06:50   DG Swallowing Func-Speech Pathology  Result Date: 04/27/2020 Objective Swallowing Evaluation: Type of Study: MBS-Modified Barium Swallow Study  Patient Details Name: Carlos Olson MRN: [id number] Date of Birth: Aug 30, 1940 Today's Date: 04/27/2020 Time: SLP Start Time (ACUTE ONLY): 1440 -SLP Stop Time (ACUTE ONLY): 1791 SLP  Time Calculation (min) (ACUTE ONLY): 18 min Past Medical History: Past Medical History: Diagnosis Date . AAA (abdominal aortic aneurysm) ([place])  . Atherosclerosis of aortic arch ([place]) 09/25/2015  By CT scan  . CAD (coronary artery disease) 09/25/2015  Mild 3v by CT scan  . Cancer [facility])   prostate . Colon polyps  . COPD (chronic  obstructive pulmonary disease) ([place])  . Diverticulosis of colon  . Femoral bruit   bilateral, found by Dr. Jefm Bryant . Heart murmur   child . History of prostate cancer  . IBS (irritable bowel syndrome)   improved after retirement . Osteoarthritis   knees and cervical/lumbar spine s/p surgeries and injections . Pelvic fracture ([place])  . Pneumonia   child . Pulmonary nodules 09/25/2015  On screening CT scan, rpt 1 yr  . Shortness of breath   doe Past Surgical History: Past Surgical History: Procedure Laterality Date . ANTERIOR CERVICAL DECOMP/DISCECTOMY FUSION  07/29/2011  Procedure: ANTERIOR CERVICAL DECOMPRESSION/DISCECTOMY FUSION 1 LEVEL;  Surgeon: Jessy Oto, MD;  Location: [place];  Service: Orthopedics;  Laterality: N/A;  Anterior Cervical Discectomy and Fusion C3-4 . AORTA - BILATERAL FEMORAL ARTERY BYPASS GRAFT N/A 04/17/2020  Procedure: AORTA BIFEMORAL BYPASS GRAFT USING A HEMASHIELD GOLD BIFURCATED GRAFT;  Surgeon: Waynetta Sandy, MD;  Location: [place];  Service: Vascular;  Laterality: N/A; . CATARACT EXTRACTION W/PHACO Right 07/10/2015  Procedure: CATARACT EXTRACTION PHACO AND INTRAOCULAR LENS PLACEMENT (Echo);  Surgeon: Birder Robson, MD;  Location: [facility];  Service: Ophthalmology;  Laterality: Right;  [place] 00:58  . CATARACT EXTRACTION W/PHACO Left 07/24/2015  Procedure: CATARACT EXTRACTION PHACO AND INTRAOCULAR LENS PLACEMENT (IOC);  Surgeon: Birder Robson, MD;  Location: [facility];  Service: Ophthalmology;  Laterality: Left;  [place] 00:38  . COLONOSCOPY  12/2007  [place], diverticulosis and polyp, rpt 5 yrs . dental implants   . EMBOLIZATION Right 03/26/2020  Procedure: EMBOLIZATION;  Surgeon: Waynetta Sandy, MD;  Location: [place] [facility];  Service: Cardiovascular;  Laterality: Right; . FORAMINOTOMY 2 LEVEL Left 10/2013  C4/5, C5/6 ([place]) . HERNIA REPAIR    right 05/1999, left 09/04/05 . JOINT REPLACEMENT Right   TKR . KNEE ARTHROPLASTY Left 05/20/2017  Procedure: COMPUTER ASSISTED TOTAL  KNEE ARTHROPLASTY;  Surgeon: Dereck Leep, MD;  Location: [facility];  Service: Orthopedics;  Laterality: Left; . KNEE ARTHROSCOPY  09/1997  left . POSTERIOR CERVICAL FUSION/FORAMINOTOMY N/A 11/22/2013  Procedure: LEFT C4-5 AND C5-6 FORAMINOTOMY;  Surgeon: Jessy Oto, MD;  Location: [place];  Service: Orthopedics;  Laterality: N/A; . PROSTATECTOMY  08/1990 . REPLACEMENT TOTAL KNEE Right 07/2012 . TONSILLECTOMY   HPI: Carlos Olson is a 79 y.o. 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 CXR 04/23/20 revealed lower lobe opacification with trace pleural fluid. CXR 10/28: Persistent BILATERAL pulmonary infiltrates consistent with multifocal pneumonia, slightly greater on RIGHT.  No data recorded Assessment / Plan / Recommendation CHL IP CLINICAL IMPRESSIONS 04/27/2020 Clinical Impression Pt presented with mild oropharyngeal dysphagia characterized by reduced bolus propulsion, reduced lingual retraction, reduced pharyngeal constriction, and reduced anterior laryngeal movement. He exhibited difficulty with A-P transport of boluses and bolus transport was intermittently slowed through the pharynx. Bolus aggregation in the valleculae was noted with regular texture solids and pt denied sensation of the bolus stating that he had swallowed it. The bolus was ultimately cleared with a liquid wash. He demonstrated vallecular residue, posterior  pharyngeal wall residue, and pyriform sinus residue which was improved with liquid washes. No episodes of penetration/aspiration were noted during the study. It is recommended that the current diet of dysphagia 2 solids and thin liquids be continued at this time with observance of swallowing precautions and SLP will follow briefly to ensure tolerance.  SLP Visit Diagnosis Dysphagia, pharyngeal phase (R13.13) Attention and concentration deficit following -- Frontal lobe and executive  function deficit following -- Impact on safety and function Mild aspiration risk   CHL IP TREATMENT RECOMMENDATION 04/27/2020 Treatment Recommendations Therapy as outlined in treatment plan below   Prognosis 04/27/2020 Prognosis for Safe Diet Advancement Good Barriers to Reach Goals -- Barriers/Prognosis Comment -- CHL IP DIET RECOMMENDATION 04/27/2020 SLP Diet Recommendations Dysphagia 3 (Mech soft) solids;Thin liquid Liquid Administration via Cup;Straw Medication Administration Whole meds with liquid Compensations Slow rate;Small sips/bites;Minimize environmental distractions;Follow solids with liquid Postural Changes Remain semi-upright after after feeds/meals (Comment);Seated upright at 90 degrees   CHL IP OTHER RECOMMENDATIONS 04/27/2020 Recommended Consults -- Oral Care Recommendations Oral care BID Other Recommendations --   CHL IP FOLLOW UP RECOMMENDATIONS 04/27/2020 Follow up Recommendations Other (comment)   CHL IP FREQUENCY AND DURATION 04/27/2020 Speech Therapy Frequency (ACUTE ONLY) min 2x/week Treatment Duration 1 week      CHL IP ORAL PHASE 04/27/2020 Oral Phase Impaired Oral - Pudding Teaspoon -- Oral - Pudding Cup -- Oral - Honey Teaspoon -- Oral - Honey Cup -- Oral - Nectar Teaspoon -- Oral - Nectar Cup -- Oral - Nectar Straw -- Oral - Thin Teaspoon -- Oral - Thin Cup Reduced posterior propulsion Oral - Thin Straw Reduced posterior propulsion Oral - Puree Reduced posterior propulsion Oral - Mech Soft Reduced posterior propulsion Oral - Regular Reduced posterior propulsion Oral - Multi-Consistency -- Oral - Pill -- Oral Phase - Comment --  CHL IP PHARYNGEAL PHASE 04/27/2020 Pharyngeal Phase Impaired Pharyngeal- Pudding Teaspoon -- Pharyngeal -- Pharyngeal- Pudding Cup -- Pharyngeal -- Pharyngeal- Honey Teaspoon -- Pharyngeal -- Pharyngeal- Honey Cup -- Pharyngeal -- Pharyngeal- Nectar Teaspoon -- Pharyngeal -- Pharyngeal- Nectar Cup -- Pharyngeal -- Pharyngeal- Nectar Straw -- Pharyngeal --  Pharyngeal- Thin Teaspoon -- Pharyngeal -- Pharyngeal- Thin Cup Reduced tongue base retraction;Reduced anterior laryngeal mobility;Pharyngeal residue - valleculae;Pharyngeal residue - posterior pharnyx;Pharyngeal residue - pyriform;Reduced pharyngeal peristalsis Pharyngeal -- Pharyngeal- Thin Straw Reduced tongue base retraction;Reduced anterior laryngeal mobility;Pharyngeal residue - valleculae;Pharyngeal residue - posterior pharnyx;Pharyngeal residue - pyriform;Reduced pharyngeal peristalsis Pharyngeal -- Pharyngeal- Puree Reduced tongue base retraction;Reduced anterior laryngeal mobility;Pharyngeal residue - valleculae;Pharyngeal residue - posterior pharnyx;Pharyngeal residue - pyriform;Reduced pharyngeal peristalsis Pharyngeal -- Pharyngeal- Mechanical Soft Reduced tongue base retraction;Reduced anterior laryngeal mobility;Pharyngeal residue - valleculae;Pharyngeal residue - posterior pharnyx;Pharyngeal residue - pyriform;Reduced pharyngeal peristalsis Pharyngeal -- Pharyngeal- Regular Reduced tongue base retraction;Reduced anterior laryngeal mobility;Pharyngeal residue - valleculae;Pharyngeal residue - posterior pharnyx;Pharyngeal residue - pyriform;Reduced pharyngeal peristalsis Pharyngeal -- Pharyngeal- Multi-consistency -- Pharyngeal -- Pharyngeal- Pill Reduced tongue base retraction;Reduced anterior laryngeal mobility;Pharyngeal residue - valleculae;Pharyngeal residue - posterior pharnyx;Pharyngeal residue - pyriform;Reduced pharyngeal peristalsis Pharyngeal -- Pharyngeal Comment --  CHL IP CERVICAL ESOPHAGEAL PHASE 04/27/2020 Cervical Esophageal Phase WFL Pudding Teaspoon -- Pudding Cup -- Honey Teaspoon -- Honey Cup -- Nectar Teaspoon -- Nectar Cup -- Nectar Straw -- Thin Teaspoon -- Thin Cup -- Thin Straw -- Puree -- Mechanical Soft -- Regular -- Multi-consistency -- Pill -- Cervical Esophageal Comment -- Shanika I. Hardin Negus, [place], [place] Office number [phone] Pager  [place] 04/27/2020, 3:36 PM  ECHO TEE  Result Date: 04/26/2020    TRANSESOPHOGEAL ECHO REPORT   Patient Name:   Carlos Olson Date of Exam: 04/26/2020 Medical Rec #:  [id number]      Height:       75.0 in Accession #:    [id number]     Weight:       208.8 lb Date of Birth:  06-03-1941     BSA:          2.235 m Patient Age:    35 years       BP:           132/70 mmHg Patient Gender: M              HR:           130 bpm. Exam Location:  Inpatient Procedure: Transesophageal Echo Indications:    I48.91* Unspeicified atrial fibrillation  History:        Patient has prior history of Echocardiogram examinations, most                 recent 04/13/2020. CAD, COPD; Signs/Symptoms:Shortness of                 Breath.  Sonographer:    Bernadene Person RDCS Referring Phys: [id number] Greer Ee [place] PROCEDURE: After discussion of the risks and benefits of a TEE, an informed consent was obtained from the patient. The transesophogeal probe was passed without difficulty through the esophogus of the patient. Local oropharyngeal anesthetic was provided with viscous lidocaine. Sedation performed by different physician. The patient was monitored while under deep sedation. Anesthestetic sedation was provided intravenously by Anesthesiology: 339.61mg  of Propofol. The patient developed no complications during the procedure. A successful direct current cardioversion was performed at 200 joules with 2 attempts. IMPRESSIONS  1. Left ventricular ejection fraction, by estimation, is 50 to 55%. The left ventricle has low normal function. The left ventricle has no regional wall motion abnormalities.  2. Right ventricular systolic function is normal. The right ventricular size is normal.  3. Left atrial size was moderately dilated. No left atrial/left atrial appendage thrombus was detected.  4. Right atrial size was mildly dilated.  5. The mitral valve is normal in structure. Trivial mitral valve  regurgitation.  6. The aortic valve is tricuspid. There is mild calcification of the aortic valve. There is mild thickening of the aortic valve. Aortic valve regurgitation is trivial. Mild aortic valve sclerosis is present, with no evidence of aortic valve stenosis.  7. Moderate-to-severe plaque visualized in the descending aorta and aortic arch. Aortic dilatation noted. There is mild dilatation of the aortic root, measuring 40 mm. There is mild to moderate dilatation of the ascending aorta, measuring 41 mm.  8. Agitated saline contrast bubble study was positive with shunting observed within 3-6 cardiac cycles suggestive of interatrial shunt. FINDINGS  Left Ventricle: Left ventricular ejection fraction, by estimation, is 50 to 55%. The left ventricle has low normal function. The left ventricle has no regional wall motion abnormalities. The left ventricular internal cavity size was normal in size. Right Ventricle: The right ventricular size is normal. Right vetricular wall thickness was not assessed. Right ventricular systolic function is normal. Left Atrium: Left atrial size was moderately dilated. No left atrial/left atrial appendage thrombus was detected. Right Atrium: Right atrial size was mildly dilated. Pericardium: There is no evidence of pericardial effusion. Mitral Valve: The mitral valve is normal in structure. There is mild thickening of the mitral valve leaflet(s).  Mild mitral annular calcification. Trivial mitral valve regurgitation. Tricuspid Valve: The tricuspid valve is normal in structure. Tricuspid valve regurgitation is mild. Aortic Valve: The aortic valve is tricuspid. There is mild calcification of the aortic valve. There is mild thickening of the aortic valve. Aortic valve regurgitation is trivial. Mild aortic valve sclerosis is present, with no evidence of aortic valve stenosis. Pulmonic Valve: The pulmonic valve was normal in structure. Pulmonic valve regurgitation is trivial. Aorta:  Moderate-to-severe plaque visualized in the descending aorta and aortic arch. Aortic dilatation noted. There is mild dilatation of the aortic root, measuring 40 mm. There is mild to moderate dilatation of the ascending aorta, measuring 41 mm. IAS/Shunts: Agitated saline contrast was given intravenously to evaluate for intracardiac shunting. Agitated saline contrast bubble study was positive with shunting observed within 3-6 cardiac cycles suggestive of interatrial shunt.  LEFT VENTRICLE PLAX 2D LVOT diam:     2.95 cm LVOT Area:     6.83 cm   AORTA Ao Root diam: 4.50 cm  SHUNTS Systemic Diam: 2.95 cm Gwyndolyn Kaufman MD Electronically signed by Gwyndolyn Kaufman MD Signature Date/Time: 04/26/2020/2:52:08 PM    Final     Labs: BMET Recent Labs  Lab 04/22/20 1023 04/22/20 1023 04/23/20 0202 04/23/20 0202 04/23/20 1144 04/24/20 4782 04/25/20 0803 04/26/20 0037 04/27/20 0044 04/28/20 0328 04/28/20 0408  NA 153*   < > 156*   < > 154* 151* 149* 147* 147* 149* 150*  K 3.6   < > 3.3*   < > 3.3* 3.1* 3.0* 3.6 3.3* 3.5 3.2*  CL 124*   < > 125*  --  126* 121* 120* 119* 119* 121*  --   CO2 18*   < > 18*  --  20* 19* 18* 17* 19* 17*  --   GLUCOSE 109*   < > 112*  --  130* 134* 116* 115* 123* 102*  --   BUN 53*   < > 54*  --  47* 37* 37* 38* 45* 40*  --   CREATININE 2.10*   < > 2.06*  --  1.96* 1.77* 1.87* 2.24* 2.60* 2.44*  --   CALCIUM 8.3*   < > 8.2*  --  8.2* 7.7* 7.7* 7.7* 7.9* 8.1*  --   PHOS 4.0  --   --   --   --   --   --   --   --   --   --    < > = values in this interval not displayed.   CBC Recent Labs  Lab 04/22/20 1023 04/23/20 0202 04/25/20 0803 04/25/20 0803 04/26/20 0037 04/27/20 0044 04/28/20 0205 04/28/20 0408  WBC 17.7*   < > 17.1*  --  18.2* 16.0* 15.6*  --   NEUTROABS 15.9*  --   --   --   --   --   --   --   HGB 9.3*   < > 8.6*   < > 7.9* 7.7* 8.0* 7.8*  HCT 28.7*   < > 26.7*   < > 24.7* 24.1* 25.4* 23.0*  MCV 97.6   < > 99.3  --  100.0 100.0 99.2  --   PLT  145*   < > 227  --  233 273 309  --    < > = values in this interval not displayed.    Medications:    . sodium chloride   Intravenous Once  . amiodarone  200 mg Oral BID  . apixaban  2.5 mg Oral BID  . atorvastatin  10 mg Oral QHS  . chlorhexidine  15 mL Mouth Rinse BID  . Chlorhexidine Gluconate Cloth  6 each Topical Daily  . darbepoetin (ARANESP) injection - NON-DIALYSIS  100 mcg Subcutaneous Q Mon-1800  . feeding supplement  237 mL Oral TID BM  . fluticasone furoate-vilanterol  1 puff Inhalation Daily  . lidocaine  2 patch Transdermal Q24H  . mouth rinse  15 mL Mouth Rinse q12n4p  . metoprolol tartrate  25 mg Oral BID  . pantoprazole  40 mg Oral Daily  . risperiDONE  0.25 mg Sublingual BID  . sodium bicarbonate  1,300 mg Oral BID      Louis Meckel  [employer] 04/28/2020, 8:24 AM

## 2020-04-28 NOTE — Progress Notes (Signed)
Dr. Scot Dock paged r/t PRBC infusions. MD made aware that type and screen had just been drawn and verified that MD wanted 2 units PRBCs given with last hemoglobin of 8.0. MD also made aware of pt increase in O2 needs. MD verified to give both units PRBCs, did order lasix 40mg x1 dose IV to be given between first and second unit of blood. MD also ordered BMET and CBC for tomorrow AM. All orders RBV with MD and placed in chart.

## 2020-04-29 ENCOUNTER — Encounter (HOSPITAL_COMMUNITY): Payer: Self-pay | Admitting: Cardiology

## 2020-04-29 DIAGNOSIS — I4819 Other persistent atrial fibrillation: Secondary | ICD-10-CM | POA: Diagnosis not present

## 2020-04-29 DIAGNOSIS — E43 Unspecified severe protein-calorie malnutrition: Secondary | ICD-10-CM | POA: Insufficient documentation

## 2020-04-29 LAB — TYPE AND SCREEN
ABO/RH(D): O POS
Antibody Screen: NEGATIVE
Unit division: 0
Unit division: 0

## 2020-04-29 LAB — CBC
HCT: 29.1 % — ABNORMAL LOW (ref 39.0–52.0)
Hemoglobin: 9.3 g/dL — ABNORMAL LOW (ref 13.0–17.0)
MCH: 31.2 pg (ref 26.0–34.0)
MCHC: 32 g/dL (ref 30.0–36.0)
MCV: 97.7 fL (ref 80.0–100.0)
Platelets: 358 10*3/uL (ref 150–400)
RBC: 2.98 MIL/uL — ABNORMAL LOW (ref 4.22–5.81)
RDW: 17.8 % — ABNORMAL HIGH (ref 11.5–15.5)
WBC: 15.9 10*3/uL — ABNORMAL HIGH (ref 4.0–10.5)
nRBC: 0 % (ref 0.0–0.2)

## 2020-04-29 LAB — RENAL FUNCTION PANEL
Albumin: 1.7 g/dL — ABNORMAL LOW (ref 3.5–5.0)
Anion gap: 12 (ref 5–15)
BUN: 47 mg/dL — ABNORMAL HIGH (ref 8–23)
CO2: 18 mmol/L — ABNORMAL LOW (ref 22–32)
Calcium: 8.3 mg/dL — ABNORMAL LOW (ref 8.9–10.3)
Chloride: 119 mmol/L — ABNORMAL HIGH (ref 98–111)
Creatinine, Ser: 2.6 mg/dL — ABNORMAL HIGH (ref 0.61–1.24)
GFR, Estimated: 24 mL/min — ABNORMAL LOW (ref >60)
Glucose, Bld: 123 mg/dL — ABNORMAL HIGH (ref 70–99)
Phosphorus: 4 mg/dL (ref 2.5–4.6)
Potassium: 3.3 mmol/L — ABNORMAL LOW (ref 3.5–5.1)
Sodium: 149 mmol/L — ABNORMAL HIGH (ref 135–145)

## 2020-04-29 LAB — BPAM RBC
Blood Product Expiration Date: 202111192359
Blood Product Expiration Date: 202111292359
ISSUE DATE / TIME: 202110301308
ISSUE DATE / TIME: 202110301637
Unit Type and Rh: 5100
Unit Type and Rh: 5100

## 2020-04-29 LAB — GLUCOSE, CAPILLARY: Glucose-Capillary: 113 mg/dL — ABNORMAL HIGH (ref 70–99)

## 2020-04-29 MED ORDER — ACETAMINOPHEN 160 MG/5ML PO SOLN
650.0000 mg | Freq: Four times a day (QID) | ORAL | Status: DC
  Administered 2020-04-29 – 2020-05-03 (×17): 650 mg via ORAL
  Filled 2020-04-29 (×18): qty 20.3

## 2020-04-29 MED ORDER — POTASSIUM CHLORIDE 20 MEQ PO PACK
40.0000 meq | PACK | Freq: Once | ORAL | Status: AC
  Administered 2020-04-29: 40 meq via ORAL
  Filled 2020-04-29: qty 2

## 2020-04-29 MED ORDER — ACETAMINOPHEN 325 MG RE SUPP
325.0000 mg | Freq: Four times a day (QID) | RECTAL | Status: DC
  Filled 2020-04-29 (×8): qty 2

## 2020-04-29 MED ORDER — METOPROLOL TARTRATE 25 MG/10 ML ORAL SUSPENSION
50.0000 mg | Freq: Two times a day (BID) | ORAL | Status: DC
  Administered 2020-04-29 – 2020-04-30 (×3): 50 mg via ORAL
  Filled 2020-04-29 (×3): qty 20

## 2020-04-29 NOTE — Progress Notes (Signed)
Progress Note  Patient Name: Carlos Olson Date of Encounter: 04/29/2020  [facility] Cardiologist: Werner Lean, MD   Subjective   More awake and calm this morning. Sitting in a chair. Continues to have SOB and cough. No palpitations.  Remains in Afib with HR 110-130s. Inpatient Medications    Scheduled Meds: . amiodarone  200 mg Oral BID  . apixaban  2.5 mg Oral BID  . atorvastatin  10 mg Oral QHS  . chlorhexidine  15 mL Mouth Rinse BID  . Chlorhexidine Gluconate Cloth  6 each Topical Daily  . darbepoetin (ARANESP) injection - NON-DIALYSIS  100 mcg Subcutaneous Q Mon-1800  . feeding supplement  237 mL Oral TID BM  . fluticasone furoate-vilanterol  1 puff Inhalation Daily  . lidocaine  2 patch Transdermal Q24H  . mouth rinse  15 mL Mouth Rinse q12n4p  . metoprolol tartrate  25 mg Oral BID  . pantoprazole  40 mg Oral Daily  . risperiDONE  0.25 mg Sublingual BID  . sodium bicarbonate  1,300 mg Oral BID   Continuous Infusions: . sodium chloride 250 mL (04/23/20 0343)  . dextrose 75 mL/hr at 04/28/20 1811  . fluconazole (DIFLUCAN) IV Stopped (04/28/20 0908)  . piperacillin-tazobactam (ZOSYN)  IV 3.375 g (04/29/20 0337)  . vancomycin 1,000 mg (04/28/20 2354)   PRN Meds: acetaminophen **OR** acetaminophen, alum & mag hydroxide-simeth, bisacodyl, Gerhardt's butt cream, guaiFENesin-dextromethorphan, HYDROmorphone (DILAUDID) injection, ondansetron, phenol, sodium phosphate, traZODone   Vital Signs    Vitals:   04/29/20 0300 04/29/20 0330 04/29/20 0400 04/29/20 0734  BP: (!) 153/87  (!) 161/93 (!) 189/98  Pulse: (!) 104 (!) 105 (!) 110   Resp: (!) 22 (!) 25 (!) 25   Temp:   99 F (37.2 C)   TempSrc:   Axillary   SpO2: 96% 92% (!) 88%   Weight:      Height:        Intake/Output Summary (Last 24 hours) at 04/29/2020 0755 Last data filed at 04/29/2020 0500 Gross per 24 hour  Intake 1186.37 ml  Output 760 ml  Net 426.37 ml   Last 3 Weights 04/29/2020  04/28/2020 04/26/2020  Weight (lbs) (No Data) (No Data) 208 lb 12.4 oz  Weight (kg) (No Data) (No Data) 94.7 kg      Telemetry    Afib with RVR with HR ranging 110-130s - Personally Reviewed  ECG    No new ECG - Personally Reviewed  Physical Exam   GEN: Chronically ill appearing, sitting in chair, answering questions appropriately   Neck: No JVD Cardiac: Tachycardic, irregular, no murmurs Respiratory: Rhonchorous breath sounds that partially improve with coughing. Expiratory wheezing GI: Soft, nontender, non-distended  MS: Warm, trace pedal edema Neuro:  AAOx2; still intermittently confused but more calm today  Labs    High Sensitivity Troponin:  No results for input(s): TROPONINIHS in the last 720 hours.    Chemistry Recent Labs  Lab 04/27/20 0044 04/27/20 0044 04/28/20 0328 04/28/20 0408 04/29/20 0551  NA 147*   < > 149* 150* 149*  K 3.3*   < > 3.5 3.2* 3.3*  CL 119*  --  121*  --  119*  CO2 19*  --  17*  --  18*  GLUCOSE 123*  --  102*  --  123*  BUN 45*  --  40*  --  47*  CREATININE 2.60*  --  2.44*  --  2.60*  CALCIUM 7.9*  --  8.1*  --  8.3*  ALBUMIN  --   --   --   --  1.7*  GFRNONAA 24*  --  26*  --  24*  ANIONGAP 9  --  11  --  12   < > = values in this interval not displayed.     Hematology Recent Labs  Lab 04/27/20 0044 04/27/20 0044 04/28/20 0205 04/28/20 0408 04/29/20 0551  WBC 16.0*  --  15.6*  --  15.9*  RBC 2.41*  --  2.56*  --  2.98*  HGB 7.7*   < > 8.0* 7.8* 9.3*  HCT 24.1*   < > 25.4* 23.0* 29.1*  MCV 100.0  --  99.2  --  97.7  MCH 32.0  --  31.3  --  31.2  MCHC 32.0  --  31.5  --  32.0  RDW 16.4*  --  16.4*  --  17.8*  PLT 273  --  309  --  358   < > = values in this interval not displayed.    BNPNo results for input(s): BNP, PROBNP in the last 168 hours.   DDimer No results for input(s): DDIMER in the last 168 hours.   Radiology    DG CHEST PORT 1 VIEW  Result Date: 04/28/2020 CLINICAL DATA:  Dyspnea EXAM: PORTABLE  CHEST 1 VIEW COMPARISON:  04/27/2020 FINDINGS: Extensive airspace infiltrate within the right mid lung zone has progressed slightly in the interval since prior examination. Tiny right pleural effusion is likely present, unchanged from prior examination. Mild superimposed bibasilar atelectasis. No pneumothorax. Cardiac size within normal limits. Pulmonary vascularity is normal. No acute bone abnormality. IMPRESSION: Progressive right mid lung zone pulmonary infiltrate, likely infectious in the appropriate clinical setting. Electronically Signed   By: Fidela Salisbury MD   On: 04/28/2020 00:09   DG Swallowing Func-Speech Pathology  Result Date: 04/27/2020 Objective Swallowing Evaluation: Type of Study: MBS-Modified Barium Swallow Study  Patient Details Name: Carlos Olson MRN: [id number] Date of Birth: 27-Mar-1941 Today's Date: 04/27/2020 Time: SLP Start Time (ACUTE ONLY): 1440 -SLP Stop Time (ACUTE ONLY): 1458 SLP Time Calculation (min) (ACUTE ONLY): 18 min Past Medical History: Past Medical History: Diagnosis Date . AAA (abdominal aortic aneurysm) ([place])  . Atherosclerosis of aortic arch ([place]) 09/25/2015  By CT scan  . CAD (coronary artery disease) 09/25/2015  Mild 3v by CT scan  . Cancer [facility])   prostate . Colon polyps  . COPD (chronic obstructive pulmonary disease) ([place])  . Diverticulosis of colon  . Femoral bruit   bilateral, found by Dr. Jefm Bryant . Heart murmur   child . History of prostate cancer  . IBS (irritable bowel syndrome)   improved after retirement . Osteoarthritis   knees and cervical/lumbar spine s/p surgeries and injections . Pelvic fracture ([place])  . Pneumonia   child . Pulmonary nodules 09/25/2015  On screening CT scan, rpt 1 yr  . Shortness of breath   doe Past Surgical History: Past Surgical History: Procedure Laterality Date . ANTERIOR CERVICAL DECOMP/DISCECTOMY FUSION  07/29/2011  Procedure: ANTERIOR CERVICAL DECOMPRESSION/DISCECTOMY FUSION 1 LEVEL;  Surgeon: Jessy Oto, MD;  Location: [place];   Service: Orthopedics;  Laterality: N/A;  Anterior Cervical Discectomy and Fusion C3-4 . AORTA - BILATERAL FEMORAL ARTERY BYPASS GRAFT N/A 04/17/2020  Procedure: AORTA BIFEMORAL BYPASS GRAFT USING A HEMASHIELD GOLD BIFURCATED GRAFT;  Surgeon: Waynetta Sandy, MD;  Location: [place];  Service: Vascular;  Laterality: N/A; . CATARACT EXTRACTION W/PHACO Right 07/10/2015  Procedure: CATARACT EXTRACTION PHACO  AND INTRAOCULAR LENS PLACEMENT (IOC);  Surgeon: Birder Robson, MD;  Location: [facility];  Service: Ophthalmology;  Laterality: Right;  [place] 00:58  . CATARACT EXTRACTION W/PHACO Left 07/24/2015  Procedure: CATARACT EXTRACTION PHACO AND INTRAOCULAR LENS PLACEMENT (IOC);  Surgeon: Birder Robson, MD;  Location: [facility];  Service: Ophthalmology;  Laterality: Left;  [place] 00:38  . COLONOSCOPY  12/2007  [place], diverticulosis and polyp, rpt 5 yrs . dental implants   . EMBOLIZATION Right 03/26/2020  Procedure: EMBOLIZATION;  Surgeon: Waynetta Sandy, MD;  Location: [place] [facility];  Service: Cardiovascular;  Laterality: Right; . FORAMINOTOMY 2 LEVEL Left 10/2013  C4/5, C5/6 ([place]) . HERNIA REPAIR    right 05/1999, left 09/04/05 . JOINT REPLACEMENT Right   TKR . KNEE ARTHROPLASTY Left 05/20/2017  Procedure: COMPUTER ASSISTED TOTAL KNEE ARTHROPLASTY;  Surgeon: Dereck Leep, MD;  Location: [facility];  Service: Orthopedics;  Laterality: Left; . KNEE ARTHROSCOPY  09/1997  left . POSTERIOR CERVICAL FUSION/FORAMINOTOMY N/A 11/22/2013  Procedure: LEFT C4-5 AND C5-6 FORAMINOTOMY;  Surgeon: Jessy Oto, MD;  Location: [place];  Service: Orthopedics;  Laterality: N/A; . PROSTATECTOMY  08/1990 . REPLACEMENT TOTAL KNEE Right 07/2012 . TONSILLECTOMY   HPI: GILL DELROSSI is a 79 y.o. 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 CXR 04/23/20 revealed lower lobe opacification with trace pleural fluid. CXR 10/28: Persistent  BILATERAL pulmonary infiltrates consistent with multifocal pneumonia, slightly greater on RIGHT.  No data recorded Assessment / Plan / Recommendation CHL IP CLINICAL IMPRESSIONS 04/27/2020 Clinical Impression Pt presented with mild oropharyngeal dysphagia characterized by reduced bolus propulsion, reduced lingual retraction, reduced pharyngeal constriction, and reduced anterior laryngeal movement. He exhibited difficulty with A-P transport of boluses and bolus transport was intermittently slowed through the pharynx. Bolus aggregation in the valleculae was noted with regular texture solids and pt denied sensation of the bolus stating that he had swallowed it. The bolus was ultimately cleared with a liquid wash. He demonstrated vallecular residue, posterior pharyngeal wall residue, and pyriform sinus residue which was improved with liquid washes. No episodes of penetration/aspiration were noted during the study. It is recommended that the current diet of dysphagia 2 solids and thin liquids be continued at this time with observance of swallowing precautions and SLP will follow briefly to ensure tolerance.  SLP Visit Diagnosis Dysphagia, pharyngeal phase (R13.13) Attention and concentration deficit following -- Frontal lobe and executive function deficit following -- Impact on safety and function Mild aspiration risk   CHL IP TREATMENT RECOMMENDATION 04/27/2020 Treatment Recommendations Therapy as outlined in treatment plan below   Prognosis 04/27/2020 Prognosis for Safe Diet Advancement Good Barriers to Reach Goals -- Barriers/Prognosis Comment -- CHL IP DIET RECOMMENDATION 04/27/2020 SLP Diet Recommendations Dysphagia 3 (Mech soft) solids;Thin liquid Liquid Administration via Cup;Straw Medication Administration Whole meds with liquid Compensations Slow rate;Small sips/bites;Minimize environmental distractions;Follow solids with liquid Postural Changes Remain semi-upright after after feeds/meals (Comment);Seated  upright at 90 degrees   CHL IP OTHER RECOMMENDATIONS 04/27/2020 Recommended Consults -- Oral Care Recommendations Oral care BID Other Recommendations --   CHL IP FOLLOW UP RECOMMENDATIONS 04/27/2020 Follow up Recommendations Other (comment)   CHL IP FREQUENCY AND DURATION 04/27/2020 Speech Therapy Frequency (ACUTE ONLY) min 2x/week Treatment Duration 1 week      CHL IP ORAL PHASE 04/27/2020 Oral Phase Impaired Oral - Pudding Teaspoon -- Oral - Pudding Cup -- Oral - Honey  Teaspoon -- Oral - Honey Cup -- Oral - Nectar Teaspoon -- Oral - Nectar Cup -- Oral - Nectar Straw -- Oral - Thin Teaspoon -- Oral - Thin Cup Reduced posterior propulsion Oral - Thin Straw Reduced posterior propulsion Oral - Puree Reduced posterior propulsion Oral - Mech Soft Reduced posterior propulsion Oral - Regular Reduced posterior propulsion Oral - Multi-Consistency -- Oral - Pill -- Oral Phase - Comment --  CHL IP PHARYNGEAL PHASE 04/27/2020 Pharyngeal Phase Impaired Pharyngeal- Pudding Teaspoon -- Pharyngeal -- Pharyngeal- Pudding Cup -- Pharyngeal -- Pharyngeal- Honey Teaspoon -- Pharyngeal -- Pharyngeal- Honey Cup -- Pharyngeal -- Pharyngeal- Nectar Teaspoon -- Pharyngeal -- Pharyngeal- Nectar Cup -- Pharyngeal -- Pharyngeal- Nectar Straw -- Pharyngeal -- Pharyngeal- Thin Teaspoon -- Pharyngeal -- Pharyngeal- Thin Cup Reduced tongue base retraction;Reduced anterior laryngeal mobility;Pharyngeal residue - valleculae;Pharyngeal residue - posterior pharnyx;Pharyngeal residue - pyriform;Reduced pharyngeal peristalsis Pharyngeal -- Pharyngeal- Thin Straw Reduced tongue base retraction;Reduced anterior laryngeal mobility;Pharyngeal residue - valleculae;Pharyngeal residue - posterior pharnyx;Pharyngeal residue - pyriform;Reduced pharyngeal peristalsis Pharyngeal -- Pharyngeal- Puree Reduced tongue base retraction;Reduced anterior laryngeal mobility;Pharyngeal residue - valleculae;Pharyngeal residue - posterior pharnyx;Pharyngeal residue -  pyriform;Reduced pharyngeal peristalsis Pharyngeal -- Pharyngeal- Mechanical Soft Reduced tongue base retraction;Reduced anterior laryngeal mobility;Pharyngeal residue - valleculae;Pharyngeal residue - posterior pharnyx;Pharyngeal residue - pyriform;Reduced pharyngeal peristalsis Pharyngeal -- Pharyngeal- Regular Reduced tongue base retraction;Reduced anterior laryngeal mobility;Pharyngeal residue - valleculae;Pharyngeal residue - posterior pharnyx;Pharyngeal residue - pyriform;Reduced pharyngeal peristalsis Pharyngeal -- Pharyngeal- Multi-consistency -- Pharyngeal -- Pharyngeal- Pill Reduced tongue base retraction;Reduced anterior laryngeal mobility;Pharyngeal residue - valleculae;Pharyngeal residue - posterior pharnyx;Pharyngeal residue - pyriform;Reduced pharyngeal peristalsis Pharyngeal -- Pharyngeal Comment --  CHL IP CERVICAL ESOPHAGEAL PHASE 04/27/2020 Cervical Esophageal Phase WFL Pudding Teaspoon -- Pudding Cup -- Honey Teaspoon -- Honey Cup -- Nectar Teaspoon -- Nectar Cup -- Nectar Straw -- Thin Teaspoon -- Thin Cup -- Thin Straw -- Puree -- Mechanical Soft -- Regular -- Multi-consistency -- Pill -- Cervical Esophageal Comment -- Shanika I. Hardin Negus, [place], [place] Office number [phone] Pager [place] 04/27/2020, 3:36 PM               Cardiac Studies  TEE 04/26/20: IMPRESSIONS  1. Left ventricular ejection fraction, by estimation, is 50 to 55%. The  left ventricle has low normal function. The left ventricle has no regional  wall motion abnormalities.  2. Right ventricular systolic function is normal. The right ventricular  size is normal.  3. Left atrial size was moderately dilated. No left atrial/left atrial  appendage thrombus was detected.  4. Right atrial size was mildly dilated.  5. The mitral valve is normal in structure. Trivial mitral valve  regurgitation.  6. The aortic valve is tricuspid. There is mild calcification of  the  aortic valve. There is mild thickening of the aortic valve. Aortic valve  regurgitation is trivial. Mild aortic valve sclerosis is present, with no  evidence of aortic valve stenosis.  7. Moderate-to-severe plaque visualized in the descending aorta and  aortic arch. Aortic dilatation noted. There is mild dilatation of the  aortic root, measuring 40 mm. There is mild to moderate dilatation of the  ascending aorta, measuring 41 mm.  8. Agitated saline contrast bubble study was positive with shunting  observed within 3-6 cardiac cycles suggestive of interatrial shunt.   TTE 04/13/20: IMPRESSIONS  1. Left ventricular ejection fraction, by estimation, is 50%. The left  ventricle has mildly decreased function. The left ventricle demonstrates  global hypokinesis. Left ventricular diastolic  parameters are consistent  with Grade I diastolic dysfunction  (impaired relaxation).  2. Right ventricular systolic function is normal. The right ventricular  size is normal. There is normal pulmonary artery systolic pressure. The  estimated right ventricular systolic pressure is 33.5 mmHg.  3. Left atrial size was moderately dilated.  4. Right atrial size was mildly dilated.  5. The mitral valve is normal in structure. No evidence of mitral valve  regurgitation. No evidence of mitral stenosis.  6. The aortic valve is tricuspid. Aortic valve regurgitation is trivial.  Mild aortic valve sclerosis is present, with no evidence of aortic valve  stenosis.  7. The inferior vena cava is normal in size with greater than 50%  respiratory variability, suggesting right atrial pressure of 3 mmHg.   Patient Profile     79 y.o. 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  Assessment & Plan   #Afib  with RVR Poorly controlled, but improved from yesterday with HR 120s this AM. S/p DCCV 10/28 with initial return to NSR, however, converted back into Afib with RVR shortly thereafter.  -TTE with LVEF 50% with moderate LAE and mild RAE -Continue amiodarone 200mg  BID -Continue eliquis 2.5mg  BID -Up-titrate metop to 50mg  BID; will up-titrate as tolerated as has BP room today -Pending continued clinical improvement, can repeat DCCV as out-patient once completed 3 weeks of AC  #Suspected Aspiration Pneumonia: -Continue vanc and zosyn -Fluc added due to yeast in sputum per vascular -Management per primary  #Acute renal failure: Cr persistently elevated. 2.6-->2.4-->2.6. -Nephrology following -Holding ACEi  #HTN: Remains elevated. -Up-titrate metoprolol to 50mg  BID -Can add amlodipine if needed for additional BP control; better controlled on my exam at 120/70s  #Delirium with paranoia: Improved today. Waxing and waning. Intermittently refusing medications -Continue to try to regularize sleep-wake cycle -Trazodone prn at night -Family at bedside -Possible transfer out of ICU today  #AAA s/p open abdominal aortic aneurysm repair: -Management per vascular surgery -Continue statin -No ASA given need for [facility]  #Thrombocytopenia/anemia -Improved -S/p 2 units pRBC on 10/30 with HgB 9.3 this AM    For questions or updates, please contact [place] Please consult [email or web address] for contact info under    Signed, Freada Bergeron, MD  04/29/2020, 7:55 AM

## 2020-04-29 NOTE — Progress Notes (Signed)
   VASCULAR SURGERY ASSESSMENT & PLAN:   S/P OPEN ABDOMINAL AORTIC ANEURYSM REPAIR: Patient is making steady progress.  Currently his pulmonary status is the primary issue.   PULMONARY: His lungs sound better today. He is on vancomycin and Zosyn. Given that he had rare yeast I added fluconazole yesterday.  CARDIAC: He was cardioverted but went back into rapid atrial fibrillation.  Cardiology is following.  His heart rate is still elevated.  He will likely need to stay in the unit.  AKI: Today's labs pending.  Nephrology is following.  GI NUTRITION: He is tolerating regular diet.  ID:  He is afebrile.  He is on intravenous vancomycin, Zosyn, and fluconazole.  Blood cultures have been negative for 2 days.  His sputum culture on 04/26/2020 had few Candida albicans.  ANEMIA: He was transfused 2 units of packed red blood cells yesterday.  His follow-up CBC is pending.  DVT PROPHYLAXIS: He is on Eliquis   SUBJECTIVE:   Confused but no specific complaints.  PHYSICAL EXAM:   Vitals:   04/29/20 0230 04/29/20 0300 04/29/20 0330 04/29/20 0400  BP:  (!) 153/87  (!) 161/93  Pulse: (!) 107 (!) 104 (!) 105 (!) 110  Resp: (!) 22 (!) 22 (!) 25 (!) 25  Temp:      TempSrc:      SpO2: 98% 96% 92% (!) 88%  Weight:      Height:       Lungs sound better today with fewer rhonchi. Abdomen soft and nontender. His abdominal incision and right groin incision looks fine. Good Doppler signals in both feet.  LABS:   Lab Results  Component Value Date   WBC 15.9 (H) 04/29/2020   HGB 9.3 (L) 04/29/2020   HCT 29.1 (L) 04/29/2020   MCV 97.7 04/29/2020   PLT 358 04/29/2020   Lab Results  Component Value Date   CREATININE 2.44 (H) 04/28/2020   Lab Results  Component Value Date   INR 1.5 (H) 04/26/2020    PROBLEM LIST:    Active Problems:   AAA (abdominal aortic aneurysm) ([facility])   ARF (acute renal failure) ([facility])   CURRENT MEDS:   . amiodarone  200 mg Oral BID  . apixaban  2.5 mg  Oral BID  . atorvastatin  10 mg Oral QHS  . chlorhexidine  15 mL Mouth Rinse BID  . Chlorhexidine Gluconate Cloth  6 each Topical Daily  . darbepoetin (ARANESP) injection - NON-DIALYSIS  100 mcg Subcutaneous Q Mon-1800  . feeding supplement  237 mL Oral TID BM  . fluticasone furoate-vilanterol  1 puff Inhalation Daily  . lidocaine  2 patch Transdermal Q24H  . mouth rinse  15 mL Mouth Rinse q12n4p  . metoprolol tartrate  25 mg Oral BID  . pantoprazole  40 mg Oral Daily  . risperiDONE  0.25 mg Sublingual BID  . sodium bicarbonate  1,300 mg Oral BID    Deitra Mayo Office: [phone] 04/29/2020

## 2020-04-29 NOTE — Progress Notes (Signed)
[facility] Progress Note    Assessment/ Plan:   AKI: Likely secondary to acute tubular injury in the context of hypotension, worsening anemia -Baseline creatinine seems to be around 1 -Peak creatinine 3.1 on 10/21,  Nadir was 1.77 on 10/26.  Now trending worse again , I think due to hemodynamic reasons with rapid A fib- hopefully will stabilize- no dialysis needs-      Hypernatremia - trending better, then stable-   Drinking water -  d5w was stopped.  then trending up again-  Cont to encourage water but added back some I3K  Metabolic acidosis -related to aki, oral bicarb is ordered - pretty stable  H/o HTN: intermittently hypotensive earlier, on rate ctrl for afib. Now BP is higher- should allow for more rate control-  Appears metoprolol was increased-  Also on amio  Anemia -Transfuse for Hgb<7 g/dL- added ESA- not helping the afib I am sure- given blood on 10/30  A. fib.  Currently on amio, anticoagulation resumed-  Cards- s/p cardioversion but then had recurrence-  repleting potassium- will give another dose today    Encephalopathy: ICU delirium & benzos, management per primary service. 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  UOP not well recorded, crt a little worse today -    BP is high-  Got 2 units of blood yesterday -  Did get some sleep so more oriented today     Objective:   BP (!) 189/98   Pulse (!) 110   Temp 99 F (37.2 C) (Axillary)   Resp (!) 25   Ht 6\' 3"  (1.905 m)   Wt 94.7 kg   SpO2 95%   BMI 26.10 kg/m   Intake/Output Summary (Last 24 hours) at 04/29/2020 7425 Last data filed at 04/29/2020 9563 Gross per 24 hour  Intake 1144.29 ml  Output 940 ml  Net 204.29 ml   Weight change:   Physical Exam: Gen: alert but confused  HEENT: dry mucosal membranes OVF:IEPPI  irreg Resp: diminished air entry right base, normal wob, bl chest expansion RJJ:OACZ Ext: pitting edema to dep areas Neuro:  more confused  Imaging: DG CHEST PORT 1 VIEW  Result Date: 04/28/2020 CLINICAL DATA:  Dyspnea EXAM: PORTABLE CHEST 1 VIEW COMPARISON:  04/27/2020 FINDINGS: Extensive airspace infiltrate within the right mid lung zone has progressed slightly in the interval since prior examination. Tiny right pleural effusion is likely present, unchanged from prior examination. Mild superimposed bibasilar atelectasis. No pneumothorax. Cardiac size within normal limits. Pulmonary vascularity is normal. No acute bone abnormality. IMPRESSION: Progressive right mid lung zone pulmonary infiltrate, likely infectious in the appropriate clinical setting. Electronically Signed   By: Fidela Salisbury MD   On: 04/28/2020 00:09   DG Swallowing Func-Speech Pathology  Result Date: 04/27/2020 Objective Swallowing Evaluation: Type of Study: MBS-Modified Barium Swallow Study  Patient Details Name: Carlos Olson MRN: [id number] Date of Birth: [birth year] Today's Date: 04/27/2020 Time: SLP Start Time (ACUTE ONLY): 1440 -SLP Stop Time (ACUTE ONLY): 1458 SLP Time Calculation (min) (ACUTE ONLY): 18 min Past Medical History: Past Medical History: Diagnosis Date . AAA (abdominal aortic aneurysm) ([place])  . Atherosclerosis of aortic arch ([place]) 09/25/2015  By CT scan  . CAD (coronary artery disease) 09/25/2015  Mild 3v by CT scan  . Cancer [facility])   prostate . Colon  polyps  . COPD (chronic obstructive pulmonary disease) ([place])  . Diverticulosis of colon  . Femoral bruit   bilateral, found by Dr. Jefm Bryant . Heart murmur   child . History of prostate cancer  . IBS (irritable bowel syndrome)   improved after retirement . Osteoarthritis   knees and cervical/lumbar spine s/p surgeries and injections . Pelvic fracture ([place])  . Pneumonia   child . Pulmonary nodules 09/25/2015  On screening CT scan, rpt 1 yr  . Shortness of breath   doe  Past Surgical History: Past Surgical History: Procedure Laterality Date . ANTERIOR CERVICAL DECOMP/DISCECTOMY FUSION  07/29/2011  Procedure: ANTERIOR CERVICAL DECOMPRESSION/DISCECTOMY FUSION 1 LEVEL;  Surgeon: Jessy Oto, MD;  Location: [place];  Service: Orthopedics;  Laterality: N/A;  Anterior Cervical Discectomy and Fusion C3-4 . AORTA - BILATERAL FEMORAL ARTERY BYPASS GRAFT N/A 04/17/2020  Procedure: AORTA BIFEMORAL BYPASS GRAFT USING A HEMASHIELD GOLD BIFURCATED GRAFT;  Surgeon: Waynetta Sandy, MD;  Location: [place];  Service: Vascular;  Laterality: N/A; . CATARACT EXTRACTION W/PHACO Right 07/10/2015  Procedure: CATARACT EXTRACTION PHACO AND INTRAOCULAR LENS PLACEMENT ([place]);  Surgeon: Birder Robson, MD;  Location: [facility];  Service: Ophthalmology;  Laterality: Right;  [place] 00:58  . CATARACT EXTRACTION W/PHACO Left 07/24/2015  Procedure: CATARACT EXTRACTION PHACO AND INTRAOCULAR LENS PLACEMENT (IOC);  Surgeon: Birder Robson, MD;  Location: [facility];  Service: Ophthalmology;  Laterality: Left;  [place] 00:38  . COLONOSCOPY  12/2007  [place], diverticulosis and polyp, rpt 5 yrs . dental implants   . EMBOLIZATION Right 03/26/2020  Procedure: EMBOLIZATION;  Surgeon: Waynetta Sandy, MD;  Location: [facility] [facility];  Service: Cardiovascular;  Laterality: Right; . FORAMINOTOMY 2 LEVEL Left 10/2013  C4/5, C5/6 ([place]) . HERNIA REPAIR    right 05/1999, left 09/04/05 . JOINT REPLACEMENT Right   TKR . KNEE ARTHROPLASTY Left 05/20/2017  Procedure: COMPUTER ASSISTED TOTAL KNEE ARTHROPLASTY;  Surgeon: Dereck Leep, MD;  Location: [facility];  Service: Orthopedics;  Laterality: Left; . KNEE ARTHROSCOPY  09/1997  left . POSTERIOR CERVICAL FUSION/FORAMINOTOMY N/A 11/22/2013  Procedure: LEFT C4-5 AND C5-6 FORAMINOTOMY;  Surgeon: Jessy Oto, MD;  Location: [place];  Service: Orthopedics;  Laterality: N/A; . PROSTATECTOMY  08/1990 . REPLACEMENT TOTAL KNEE Right 07/2012 . TONSILLECTOMY   HPI: Carlos Olson is a 79  y.o. 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 CXR 04/23/20 revealed lower lobe opacification with trace pleural fluid. CXR 10/28: Persistent BILATERAL pulmonary infiltrates consistent with multifocal pneumonia, slightly greater on RIGHT.  No data recorded Assessment / Plan / Recommendation CHL IP CLINICAL IMPRESSIONS 04/27/2020 Clinical Impression Pt presented with mild oropharyngeal dysphagia characterized by reduced bolus propulsion, reduced lingual retraction, reduced pharyngeal constriction, and reduced anterior laryngeal movement. He exhibited difficulty with A-P transport of boluses and bolus transport was intermittently slowed through the pharynx. Bolus aggregation in the valleculae was noted with regular texture solids and pt denied sensation of the bolus stating that he had swallowed it. The bolus was ultimately cleared with a liquid wash. He demonstrated vallecular residue, posterior pharyngeal wall residue, and pyriform sinus residue which was improved with liquid washes. No episodes of penetration/aspiration were noted during the study. It is recommended that the current diet of dysphagia 2 solids and thin liquids be continued at this time with observance of swallowing precautions and SLP will follow briefly to ensure tolerance.  SLP Visit Diagnosis Dysphagia, pharyngeal phase (R13.13) Attention and concentration deficit following -- Frontal lobe and executive function deficit following -- Impact on safety and function Mild aspiration risk   CHL IP TREATMENT RECOMMENDATION 04/27/2020 Treatment Recommendations Therapy as outlined in treatment plan below   Prognosis 04/27/2020 Prognosis for Safe Diet Advancement Good Barriers to Reach Goals -- Barriers/Prognosis Comment -- CHL IP DIET RECOMMENDATION 04/27/2020 SLP Diet Recommendations Dysphagia 3 (Mech soft) solids;Thin liquid Liquid Administration  via Cup;Straw Medication Administration Whole meds with liquid Compensations Slow rate;Small sips/bites;Minimize environmental distractions;Follow solids with liquid Postural Changes Remain semi-upright after after feeds/meals (Comment);Seated upright at 90 degrees   CHL IP OTHER RECOMMENDATIONS 04/27/2020 Recommended Consults -- Oral Care Recommendations Oral care BID Other Recommendations --   CHL IP FOLLOW UP RECOMMENDATIONS 04/27/2020 Follow up Recommendations Other (comment)   CHL IP FREQUENCY AND DURATION 04/27/2020 Speech Therapy Frequency (ACUTE ONLY) min 2x/week Treatment Duration 1 week      CHL IP ORAL PHASE 04/27/2020 Oral Phase Impaired Oral - Pudding Teaspoon -- Oral - Pudding Cup -- Oral - Honey Teaspoon -- Oral - Honey Cup -- Oral - Nectar Teaspoon -- Oral - Nectar Cup -- Oral - Nectar Straw -- Oral - Thin Teaspoon -- Oral - Thin Cup Reduced posterior propulsion Oral - Thin Straw Reduced posterior propulsion Oral - Puree Reduced posterior propulsion Oral - Mech Soft Reduced posterior propulsion Oral - Regular Reduced posterior propulsion Oral - Multi-Consistency -- Oral - Pill -- Oral Phase - Comment --  CHL IP PHARYNGEAL PHASE 04/27/2020 Pharyngeal Phase Impaired Pharyngeal- Pudding Teaspoon -- Pharyngeal -- Pharyngeal- Pudding Cup -- Pharyngeal -- Pharyngeal- Honey Teaspoon -- Pharyngeal -- Pharyngeal- Honey Cup -- Pharyngeal -- Pharyngeal- Nectar Teaspoon -- Pharyngeal -- Pharyngeal- Nectar Cup -- Pharyngeal -- Pharyngeal- Nectar Straw -- Pharyngeal -- Pharyngeal- Thin Teaspoon -- Pharyngeal -- Pharyngeal- Thin Cup Reduced tongue base retraction;Reduced anterior laryngeal mobility;Pharyngeal residue - valleculae;Pharyngeal residue - posterior pharnyx;Pharyngeal residue - pyriform;Reduced pharyngeal peristalsis Pharyngeal -- Pharyngeal- Thin Straw Reduced tongue base retraction;Reduced anterior laryngeal mobility;Pharyngeal residue - valleculae;Pharyngeal residue - posterior pharnyx;Pharyngeal  residue - pyriform;Reduced pharyngeal peristalsis Pharyngeal -- Pharyngeal- Puree Reduced tongue base retraction;Reduced anterior laryngeal mobility;Pharyngeal residue - valleculae;Pharyngeal residue - posterior pharnyx;Pharyngeal residue - pyriform;Reduced pharyngeal peristalsis Pharyngeal -- Pharyngeal- Mechanical Soft Reduced tongue base retraction;Reduced anterior laryngeal mobility;Pharyngeal residue - valleculae;Pharyngeal residue - posterior pharnyx;Pharyngeal residue - pyriform;Reduced pharyngeal peristalsis Pharyngeal -- Pharyngeal- Regular Reduced tongue base retraction;Reduced anterior laryngeal mobility;Pharyngeal residue - valleculae;Pharyngeal residue - posterior pharnyx;Pharyngeal residue - pyriform;Reduced pharyngeal peristalsis Pharyngeal -- Pharyngeal- Multi-consistency -- Pharyngeal -- Pharyngeal- Pill Reduced tongue base retraction;Reduced anterior laryngeal mobility;Pharyngeal residue - valleculae;Pharyngeal residue - posterior pharnyx;Pharyngeal residue - pyriform;Reduced pharyngeal peristalsis Pharyngeal -- Pharyngeal Comment --  CHL IP CERVICAL ESOPHAGEAL PHASE 04/27/2020 Cervical Esophageal Phase WFL Pudding Teaspoon -- Pudding Cup -- Honey Teaspoon -- Honey Cup -- Nectar Teaspoon -- Nectar Cup -- Nectar Straw -- Thin Teaspoon -- Thin Cup -- Thin Straw -- Puree -- Mechanical Soft -- Regular -- Multi-consistency -- Pill -- Cervical Esophageal Comment -- Shanika I. Hardin Negus, [place], [place] Office number [phone] Pager [phone] Horton Marshall 04/27/2020, 3:36 PM               Labs: BMET Recent Labs  Lab 04/22/20 1023 04/23/20 0202 04/23/20 1144 04/23/20 1144 04/24/20 0439 04/25/20 0803 04/26/20 0037 04/27/20 0044 04/28/20 0328 04/28/20 0408 04/29/20 0551  NA 153*   < > 154*   < > 151* 149* 147* 147* 149*  150* 149*  K 3.6   < > 3.3*   < > 3.1* 3.0* 3.6 3.3* 3.5 3.2* 3.3*  CL 124*   < > 126*  --  121* 120* 119* 119* 121*  --  119*  CO2  18*   < > 20*  --  19* 18* 17* 19* 17*  --  18*  GLUCOSE 109*   < > 130*  --  134* 116* 115* 123* 102*  --  123*  BUN 53*   < > 47*  --  37* 37* 38* 45* 40*  --  47*  CREATININE 2.10*   < > 1.96*  --  1.77* 1.87* 2.24* 2.60* 2.44*  --  2.60*  CALCIUM 8.3*   < > 8.2*  --  7.7* 7.7* 7.7* 7.9* 8.1*  --  8.3*  PHOS 4.0  --   --   --   --   --   --   --   --   --  4.0   < > = values in this interval not displayed.   CBC Recent Labs  Lab 04/22/20 1023 04/23/20 0202 04/26/20 0037 04/26/20 0037 04/27/20 0044 04/28/20 0205 04/28/20 0408 04/29/20 0551  WBC 17.7*   < > 18.2*  --  16.0* 15.6*  --  15.9*  NEUTROABS 15.9*  --   --   --   --   --   --   --   HGB 9.3*   < > 7.9*   < > 7.7* 8.0* 7.8* 9.3*  HCT 28.7*   < > 24.7*   < > 24.1* 25.4* 23.0* 29.1*  MCV 97.6   < > 100.0  --  100.0 99.2  --  97.7  PLT 145*   < > 233  --  273 309  --  358   < > = values in this interval not displayed.    Medications:    . amiodarone  200 mg Oral BID  . apixaban  2.5 mg Oral BID  . atorvastatin  10 mg Oral QHS  . chlorhexidine  15 mL Mouth Rinse BID  . Chlorhexidine Gluconate Cloth  6 each Topical Daily  . darbepoetin (ARANESP) injection - NON-DIALYSIS  100 mcg Subcutaneous Q Mon-1800  . feeding supplement  237 mL Oral TID BM  . fluticasone furoate-vilanterol  1 puff Inhalation Daily  . lidocaine  2 patch Transdermal Q24H  . mouth rinse  15 mL Mouth Rinse q12n4p  . metoprolol tartrate  50 mg Oral BID  . pantoprazole  40 mg Oral Daily  . risperiDONE  0.25 mg Sublingual BID  . sodium bicarbonate  1,300 mg Oral BID      [facility] 04/29/2020, 8:24 AM

## 2020-04-29 NOTE — Progress Notes (Signed)
Patient arrived from [place] to 4E room 1.  CHG and skin assessment done.  Telemetry monitor applied and CCMD notified.  Pt and daughter oriented to unit and room to include call light and phone.  Will continue to monitor.

## 2020-04-30 ENCOUNTER — Inpatient Hospital Stay (HOSPITAL_COMMUNITY): Payer: Medicare HMO

## 2020-04-30 DIAGNOSIS — R609 Edema, unspecified: Secondary | ICD-10-CM

## 2020-04-30 DIAGNOSIS — I714 Abdominal aortic aneurysm, without rupture: Secondary | ICD-10-CM | POA: Diagnosis not present

## 2020-04-30 DIAGNOSIS — N179 Acute kidney failure, unspecified: Secondary | ICD-10-CM

## 2020-04-30 DIAGNOSIS — I4819 Other persistent atrial fibrillation: Secondary | ICD-10-CM | POA: Diagnosis not present

## 2020-04-30 DIAGNOSIS — I712 Thoracic aortic aneurysm, without rupture: Secondary | ICD-10-CM

## 2020-04-30 LAB — CBC
HCT: 29.5 % — ABNORMAL LOW (ref 39.0–52.0)
Hemoglobin: 9.5 g/dL — ABNORMAL LOW (ref 13.0–17.0)
MCH: 31.3 pg (ref 26.0–34.0)
MCHC: 32.2 g/dL (ref 30.0–36.0)
MCV: 97 fL (ref 80.0–100.0)
Platelets: 379 10*3/uL (ref 150–400)
RBC: 3.04 MIL/uL — ABNORMAL LOW (ref 4.22–5.81)
RDW: 17.3 % — ABNORMAL HIGH (ref 11.5–15.5)
WBC: 13.2 10*3/uL — ABNORMAL HIGH (ref 4.0–10.5)
nRBC: 0 % (ref 0.0–0.2)

## 2020-04-30 LAB — RENAL FUNCTION PANEL
Albumin: 1.6 g/dL — ABNORMAL LOW (ref 3.5–5.0)
Anion gap: 9 (ref 5–15)
BUN: 42 mg/dL — ABNORMAL HIGH (ref 8–23)
CO2: 20 mmol/L — ABNORMAL LOW (ref 22–32)
Calcium: 8.3 mg/dL — ABNORMAL LOW (ref 8.9–10.3)
Chloride: 119 mmol/L — ABNORMAL HIGH (ref 98–111)
Creatinine, Ser: 2.4 mg/dL — ABNORMAL HIGH (ref 0.61–1.24)
GFR, Estimated: 27 mL/min — ABNORMAL LOW (ref >60)
Glucose, Bld: 115 mg/dL — ABNORMAL HIGH (ref 70–99)
Phosphorus: 3.3 mg/dL (ref 2.5–4.6)
Potassium: 3.1 mmol/L — ABNORMAL LOW (ref 3.5–5.1)
Sodium: 148 mmol/L — ABNORMAL HIGH (ref 135–145)

## 2020-04-30 MED ORDER — POTASSIUM CHLORIDE 20 MEQ PO PACK
40.0000 meq | PACK | Freq: Two times a day (BID) | ORAL | Status: AC
  Filled 2020-04-30: qty 2

## 2020-04-30 MED ORDER — METOPROLOL TARTRATE 25 MG/10 ML ORAL SUSPENSION
62.5000 mg | Freq: Two times a day (BID) | ORAL | Status: DC
  Administered 2020-04-30 – 2020-05-02 (×4): 62.5 mg via ORAL
  Filled 2020-04-30 (×6): qty 30

## 2020-04-30 NOTE — Progress Notes (Signed)
[facility] Progress Note    Assessment/ Plan:   AKI: Likely secondary to acute tubular injury in the context of hypotension, worsening anemia. Possible cholesterol emboli and/or renal artery compromise. BL normal renal function. Crt improved slightly today -Peak Crt 3.1 now fluctuating likely from hemodynamic shifts. -Will obtain renal artery PVL to assess blood flow -C3 pending, if low may suggest cholesterol embolic injury -Both of these studies can give an idea about renal prognosis for recovery -Consider the use of IV albumin if creatinine does not improve given his serum albumin is extremely low at 1.6  Hypernatremia: slightly improved but has not normalized.  Increase D5 water to 125 cc/h.  Encourage p.o. hydration  Metabolic acidosis -Secondary to AKI.  Bicarb 20 today continue oral bicarbonate.  H/o HTN: intermittently hypotensive earlier, on rate ctrl for afib. Now BP is higher- should allow for more rate control-  Appears metoprolol was increased-  Also on amio  Hypokalemia: Klor-Con 40 mEq twice daily today.  Continue to monitor  Anemia -Transfuse for Hgb<7 g/dL- added ESA- not helping the afib I am sure- given blood on 10/30  A. fib.  Currently on amio, anticoagulation resumed-  Cards- s/p cardioversion but then had recurrence-continue with potassium repletion  Encephalopathy: ICU delirium & benzos, management per primary service. 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  Creatinine slightly improved and urine output seems to be slightly better.    Objective:   BP 130/71   Pulse 64   Temp 98.1 F (36.7 C) (Axillary)   Resp (!) 24   Ht 6\' 3"  (1.905 m)   Wt 94.7 kg   SpO2 95%   BMI 26.10 kg/m   Intake/Output Summary (Last 24 hours) at 04/30/2020 1013 Last data filed at  04/30/2020 0133 Gross per 24 hour  Intake 958.53 ml  Output 570 ml  Net 388.53 ml   Weight change:   Physical Exam: Gen: Awake, alert, answers questions HEENT: dry mucosal membranes CVS: Normal rate Resp: diminished air entry right base, normal wob, bl chest expansion MCN:[id number] Ext: pitting edema in bilateral lower extremities Neuro: Oriented to person and place  Imaging: No results found.  Labs: BMET Recent Labs  Lab 04/24/20 6283 04/24/20 6629 04/25/20 0803 04/26/20 0037 04/27/20 0044 04/28/20 0328 04/28/20 0408 04/29/20 0551 04/30/20 0339  NA 151*   < > 149* 147* 147* 149* 150* 149* 148*  K 3.1*   < > 3.0* 3.6 3.3* 3.5 3.2* 3.3* 3.1*  CL 121*  --  120* 119* 119* 121*  --  119* 119*  CO2 19*  --  18* 17* 19* 17*  --  18* 20*  GLUCOSE 134*  --  116* 115* 123* 102*  --  123* 115*  BUN 37*  --  37* 38* 45* 40*  --  47* 42*  CREATININE 1.77*  --  1.87* 2.24* 2.60* 2.44*  --  2.60* 2.40*  CALCIUM 7.7*  --  7.7* 7.7* 7.9* 8.1*  --  8.3* 8.3*  PHOS  --   --   --   --   --   --   --  4.0 3.3   < > = values in this interval not displayed.   CBC Recent Labs  Lab 04/27/20 0044 04/27/20 0044 04/28/20 0205 04/28/20 0408 04/29/20 0551 04/30/20 0339  WBC  16.0*  --  15.6*  --  15.9* 13.2*  HGB 7.7*   < > 8.0* 7.8* 9.3* 9.5*  HCT 24.1*   < > 25.4* 23.0* 29.1* 29.5*  MCV 100.0  --  99.2  --  97.7 97.0  PLT 273  --  309  --  358 379   < > = values in this interval not displayed.    Medications:    . acetaminophen  650 mg Oral Q6H   Or  . acetaminophen  325-650 mg Rectal Q6H  . amiodarone  200 mg Oral BID  . apixaban  2.5 mg Oral BID  . atorvastatin  10 mg Oral QHS  . chlorhexidine  15 mL Mouth Rinse BID  . Chlorhexidine Gluconate Cloth  6 each Topical Daily  . darbepoetin (ARANESP) injection - NON-DIALYSIS  100 mcg Subcutaneous Q Mon-1800  . feeding supplement  237 mL Oral TID BM  . fluticasone furoate-vilanterol  1 puff Inhalation Daily  . lidocaine  2 patch  Transdermal Q24H  . mouth rinse  15 mL Mouth Rinse q12n4p  . metoprolol tartrate  50 mg Oral BID  . pantoprazole  40 mg Oral Daily  . potassium chloride  40 mEq Oral BID  . risperiDONE  0.25 mg Sublingual BID  . sodium bicarbonate  1,300 mg Oral BID      [facility] 04/30/2020, 10:13 AM

## 2020-04-30 NOTE — Progress Notes (Signed)
Patient's left arm edematous and painful. Patient is unable to to use arm.   Could we please get an ultrasound on left arm to rule out dvt? Patient had IV in forearm. Site is red, edematous, painful and blisters. Covered area with foam dressing.  Thank you

## 2020-04-30 NOTE — Progress Notes (Signed)
Pt received metoprolol solution this morning, had episode of coughing afterwards resulting in slight emesis.  He refused to take any other medications ordered at that time.  I attempted to have him take later in the morning, but he still refused.  I did notice that SLP had recommended a Dys3 diet a couple of days ago, so I updated the regular diet order.

## 2020-04-30 NOTE — Progress Notes (Signed)
Inpatient Rehab Admissions Coordinator Note:   Per therapy recommendations, pt was screened for CIR candidacy by Shann Medal, PT, DPT.  At this time we are recommending a CIR consult and I will request an order per our protocol.  Please contact me with questions.   Shann Medal, PT, DPT [phone] 04/30/20 10:38 AM

## 2020-04-30 NOTE — Progress Notes (Signed)
Progress Note  Patient Name: Carlos Olson Date of Encounter: 04/30/2020  [facility] Cardiologist: Werner Lean, MD   Subjective   5 M with Coronary Artery Calcifications, Thoracic Aortic Aneurysm and baseline creatinine 1.19 who presented for AAA 04/17/20 with prolonged course:  Called 04/18/20 for hypotension, through course developed A fib RVR s/p 04/26/20 DCCV to NSR with quick return to atrial fibrillation, ICU Delirium, Aspiration PNA, and AKI.  Patient today is the lucid.  Notes that he does not feel palpitations.  Feels weak  Wants tastier food.  Son in law in the room without additional questions.  Inpatient Medications    Scheduled Meds: . acetaminophen  650 mg Oral Q6H   Or  . acetaminophen  325-650 mg Rectal Q6H  . amiodarone  200 mg Oral BID  . apixaban  2.5 mg Oral BID  . atorvastatin  10 mg Oral QHS  . chlorhexidine  15 mL Mouth Rinse BID  . Chlorhexidine Gluconate Cloth  6 each Topical Daily  . darbepoetin (ARANESP) injection - NON-DIALYSIS  100 mcg Subcutaneous Q Mon-1800  . feeding supplement  237 mL Oral TID BM  . fluticasone furoate-vilanterol  1 puff Inhalation Daily  . lidocaine  2 patch Transdermal Q24H  . mouth rinse  15 mL Mouth Rinse q12n4p  . metoprolol tartrate  50 mg Oral BID  . pantoprazole  40 mg Oral Daily  . potassium chloride  40 mEq Oral BID  . risperiDONE  0.25 mg Sublingual BID  . sodium bicarbonate  1,300 mg Oral BID   Continuous Infusions: . sodium chloride 250 mL (04/23/20 0343)  . dextrose 75 mL/hr at 04/29/20 1342  . fluconazole (DIFLUCAN) IV 200 mg (04/30/20 5974)  . piperacillin-tazobactam (ZOSYN)  IV 3.375 g (04/30/20 0122)  . vancomycin 1,000 mg (04/29/20 2322)   PRN Meds: alum & mag hydroxide-simeth, bisacodyl, Gerhardt's butt cream, guaiFENesin-dextromethorphan, HYDROmorphone (DILAUDID) injection, ondansetron, phenol, sodium phosphate, traZODone   Vital Signs    Vitals:   04/29/20 1957 04/30/20 0122  04/30/20 0411 04/30/20 0416  BP: 137/74 124/80 (!) 145/84   Pulse:  93 97   Resp:  20 20   Temp: 98.6 F (37 C) 98.3 F (36.8 C)  (!) 97.5 F (36.4 C)  TempSrc: Oral Axillary  Axillary  SpO2: 94% 95% 96%   Weight:      Height:        Intake/Output Summary (Last 24 hours) at 04/30/2020 0813 Last data filed at 04/30/2020 0133 Gross per 24 hour  Intake 985.3 ml  Output 670 ml  Net 315.3 ml   Last 3 Weights 04/29/2020 04/28/2020 04/26/2020  Weight (lbs) (No Data) (No Data) 208 lb 12.4 oz  Weight (kg) (No Data) (No Data) 94.7 kg      Telemetry    Afib 90-120s - Personally Reviewed  ECG    04/26/20 SR with frequent PACs- Personally Reviewed  Physical Exam   GEN: elderly male, frail Neck: No JVD Cardiac: tachycardia and irrergular, no murmurs, rubs, or gallops.  Respiratory: Coarse breath sounds bilateral R > L GI: Soft, nontender, non-distended  MS: +1 edema both legs; skin site red but non tender on left upper arm Neuro:  Nonfocal  Psych: Normal affect   Labs    High Sensitivity Troponin:  No results for input(s): TROPONINIHS in the last 720 hours.    Chemistry Recent Labs  Lab 04/28/20 0328 04/28/20 0328 04/28/20 0408 04/29/20 0551 04/30/20 0339  NA 149*   < >  150* 149* 148*  K 3.5   < > 3.2* 3.3* 3.1*  CL 121*  --   --  119* 119*  CO2 17*  --   --  18* 20*  GLUCOSE 102*  --   --  123* 115*  BUN 40*  --   --  47* 42*  CREATININE 2.44*  --   --  2.60* 2.40*  CALCIUM 8.1*  --   --  8.3* 8.3*  ALBUMIN  --   --   --  1.7* 1.6*  GFRNONAA 26*  --   --  24* 27*  ANIONGAP 11  --   --  12 9   < > = values in this interval not displayed.     Hematology Recent Labs  Lab 04/28/20 0205 04/28/20 0205 04/28/20 0408 04/29/20 0551 04/30/20 0339  WBC 15.6*  --   --  15.9* 13.2*  RBC 2.56*  --   --  2.98* 3.04*  HGB 8.0*   < > 7.8* 9.3* 9.5*  HCT 25.4*   < > 23.0* 29.1* 29.5*  MCV 99.2  --   --  97.7 97.0  MCH 31.3  --   --  31.2 31.3  MCHC 31.5  --   --   32.0 32.2  RDW 16.4*  --   --  17.8* 17.3*  PLT 309  --   --  358 379   < > = values in this interval not displayed.   BNPNo results for input(s): BNP, PROBNP in the last 168 hours.   DDimer No results for input(s): DDIMER in the last 168 hours.   Radiology    No results found.  Cardiac Studies   Renal artery duplex - Pending  Upper extremity duplex - Pending TEE 04/26/20: 1. Left ventricular ejection fraction, by estimation, is 50 to 55%. The  left ventricle has low normal function. The left ventricle has no regional  wall motion abnormalities.  2. Right ventricular systolic function is normal. The right ventricular  size is normal.  3. Left atrial size was moderately dilated. No left atrial/left atrial  appendage thrombus was detected.  4. Right atrial size was mildly dilated.  5. The mitral valve is normal in structure. Trivial mitral valve  regurgitation.  6. The aortic valve is tricuspid. There is mild calcification of the  aortic valve. There is mild thickening of the aortic valve. Aortic valve  regurgitation is trivial. Mild aortic valve sclerosis is present, with no  evidence of aortic valve stenosis.  7. Moderate-to-severe plaque visualized in the descending aorta and  aortic arch. Aortic dilatation noted. There is mild dilatation of the  aortic root, measuring 40 mm. There is mild to moderate dilatation of the  ascending aorta, measuring 41 mm.  8. Agitated saline contrast bubble study was positive with shunting  observed within 3-6 cardiac cycles suggestive of interatrial shunt.   Patient Profile     79 y.o. 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  Assessment & Plan    Post operative Afib with RVR HTN Blood loss anemia Thoracic and Aortic Aneurysm - s/p DCCV 04/26/20 with conversion to NSR,  but back to Afib RVR shortly thereafter - echo with EF 50% with moderate LAE and mild RAE - continue amiodarone 200 mg BID; per-idischarge will decrease to amiodarone 200 mg daily - continue eliquis 2.5  mg BID --> will need uninterrupted AC for 4 weeks s/p DCCV - with no interruptions in his [facility] so far post DCCV; may consider peri-discharge DCCV in the setting of his amiodarone load - lopressor 50 mg BID -will increase to 62. 5 mg BID  AKI Aspiration PNA - per nephrology and primary; pharmacy assisting with dosing based on kidney - obtaining renal artery duplex today  Discussed with Patient, Vascular MD, and Son in law  For questions or updates, please contact [place] Please consult [email or web address] for contact info under        Werner Lean, MD

## 2020-04-30 NOTE — Progress Notes (Signed)
PT Cancellation Note  Patient Details Name: Carlos Olson MRN: [id number] DOB: July 08, 1940   Cancelled Treatment:    Reason Eval/Treat Not Completed: (P) Patient declined, no reason specified (Pt politely declined PT this pm.  He reports he will try next time.  Provided education and encouragement and he continues to decline.  Informed RN.)   Cristela Blue 04/30/2020, 3:09 PM  Erasmo Leventhal , PTA Acute Rehabilitation Services Pager [phone] Office [phone]

## 2020-04-30 NOTE — Care Management Important Message (Signed)
Important Message  Patient Details  Name: Carlos Olson MRN: [id number] Date of Birth: February 15, 1941   Medicare Important Message Given:  Yes     Shelda Altes 04/30/2020, 12:51 PM

## 2020-04-30 NOTE — Plan of Care (Signed)
  Problem: Clinical Measurements: Goal: Will remain free from infection Outcome: Progressing   Problem: Nutrition: Goal: Adequate nutrition will be maintained Outcome: Not Progressing   Problem: Elimination: Goal: Will not experience complications related to bowel motility Outcome: Not Progressing

## 2020-04-30 NOTE — TOC Benefit Eligibility Note (Signed)
Transition of Care [facility]) Benefit Eligibility Note    Patient Details  Name: Carlos Olson MRN: [id number] Date of Birth: [birth year]   Medication/Dose: Eliquis 5mg . bid 30 day supply  Covered?: Yes  Tier:  (no tier on this plan)  Prescription Coverage [facility]  Spoke with Person/Company/Phone Number:: Frutoso Chase. W/[facility] [employer] PH# [phone]  Co-Pay: $65.97  Prior Approval: No  Deductible:  (No Deductible on this plan)       Shelda Altes Phone Number: 04/30/2020, 10:44 AM

## 2020-04-30 NOTE — Progress Notes (Signed)
Left upper extremity venous duplex and renal artery duplex completed. Refer to "CV Proc" under chart review to view preliminary results.  04/30/2020 10:17 AM Kelby Aline., MHA, RVT, RDCS, RDMS

## 2020-04-30 NOTE — Progress Notes (Addendum)
Progress Note    04/30/2020 7:21 AM 4 Days Post-Op  Subjective:  Still confused this morning.  Wanted his daughter to get him up so he could go to [place].  He was c/o bruising.  His daughter is concerned about swelling.  She states that he is not eating but has had quite a bit of water.   Afebrile HR  90's-110's afib 465'K-354'S systolic 56% [id number]  Vitals:   04/30/20 0411 04/30/20 0416  BP: (!) 145/84   Pulse: 97   Resp: 20   Temp:  (!) 97.5 F (36.4 C)  SpO2: 96%     Physical Exam: Cardiac:  Irregular Lungs:  Non labored Incisions:  Healing nicely Extremities:  Bilateral feet are warm and well perfused. +swelling BLE and left arm.  Abdomen:  Soft, NT/ND; daughter reports pt has had loose stools. +flatus  CBC    Component Value Date/Time   WBC 13.2 (H) 04/30/2020 0339   RBC 3.04 (L) 04/30/2020 0339   HGB 9.5 (L) 04/30/2020 0339   HGB 12.1 (L) 08/20/2012 0610   HCT 29.5 (L) 04/30/2020 0339   HCT 44.0 08/02/2012 0853   PLT 379 04/30/2020 0339   PLT 158 08/20/2012 0610   MCV 97.0 04/30/2020 0339   MCV 99 08/02/2012 0853   MCH 31.3 04/30/2020 0339   MCHC 32.2 04/30/2020 0339   RDW 17.3 (H) 04/30/2020 0339   RDW 13.7 08/02/2012 0853   LYMPHSABS 0.5 (L) 04/22/2020 1023   MONOABS 1.1 (H) 04/22/2020 1023   EOSABS 0.0 04/22/2020 1023   BASOSABS 0.0 04/22/2020 1023    BMET    Component Value Date/Time   NA 148 (H) 04/30/2020 0339   NA 136 08/20/2012 0610   K 3.1 (L) 04/30/2020 0339   K 4.2 08/20/2012 0610   CL 119 (H) 04/30/2020 0339   CL 102 08/20/2012 0610   CO2 20 (L) 04/30/2020 0339   CO2 26 08/20/2012 0610   GLUCOSE 115 (H) 04/30/2020 0339   GLUCOSE 123 (H) 08/20/2012 0610   BUN 42 (H) 04/30/2020 0339   BUN 7 08/20/2012 0610   CREATININE 2.40 (H) 04/30/2020 0339   CREATININE 0.75 08/20/2012 0610   CALCIUM 8.3 (L) 04/30/2020 0339   CALCIUM 8.4 (L) 08/20/2012 0610   GFRNONAA 27 (L) 04/30/2020 0339   GFRNONAA >60 08/20/2012 0610   GFRAA >60  05/07/2017 0949   GFRAA >60 08/20/2012 0610    INR    Component Value Date/Time   INR 1.5 (H) 04/26/2020 1020   INR 1.0 08/02/2012 0853     Intake/Output Summary (Last 24 hours) at 04/30/2020 0721 Last data filed at 04/30/2020 0133 Gross per 24 hour  Intake 1717.14 ml  Output 850 ml  Net 867.14 ml     Assessment:  79 y.o. 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  Bilateral feet are warm and well perfused.  RN charted pt left arm edematous and painful.  Will get venous u/s of left arm - low suspicion for DVT given he is on Eliquis but will get u/s.  -cardiac:  Pt continues to be in afib.  Had cardioversion but back in afib.  He is on Eliquis.  Cardiology following.   -  Pulmonary-pt is 96% on 2LO2NC-O2 decreased from 3L.  Mobilize pt. -Renal:  Creatinine is still elevated but down to 2.4.  Nephrology following.  Hypernatremia slightly improved.  -GI:  Pt tolerating diet, but is not eating.  May benefit from NGT and TF to get some nutrition.  Will d/w Dr. Donzetta Matters.  He has had BM, but pt not able to control in time for RN to get to room per daughter.  Heme/ID:  Acute surgical blood loss anemia-hgb stable after transfusion.  Leukocytosis improved today.  Pt on Vanc/Zosyn.   General:  Ill appearing; not in distress   Leontine Locket, PA-C [facility] [phone] 04/30/2020 7:21 AM  I have independently interviewed and examined patient and agree with PA assessment and plan above.  I had a lengthy discussion with the patient and his son-in-law at the bedside.  Ongoing issues appear to be his pulmonary status currently requiring 2 L via nasal cannula.  Creatinine is stable  and nephrology is following their input is much appreciated.  Cardiology is also following his other issue is his heart rate there input is also appreciated.  Will maintain on Eliquis they did discuss possible need for further cardioversion in the future given his persistent atrial fibrillation.  We will continue antibiotics for now.  He needs nutrition at this time more than anything.  Dispo is pending.  Gayl Ivanoff C. Donzetta Matters, MD [facility] [place] Office: [phone] Pager: [phone]

## 2020-05-01 ENCOUNTER — Inpatient Hospital Stay (HOSPITAL_COMMUNITY): Payer: Medicare HMO

## 2020-05-01 DIAGNOSIS — E43 Unspecified severe protein-calorie malnutrition: Secondary | ICD-10-CM | POA: Diagnosis not present

## 2020-05-01 DIAGNOSIS — E7220 Disorder of urea cycle metabolism, unspecified: Secondary | ICD-10-CM | POA: Diagnosis not present

## 2020-05-01 DIAGNOSIS — J9601 Acute respiratory failure with hypoxia: Secondary | ICD-10-CM | POA: Diagnosis not present

## 2020-05-01 DIAGNOSIS — J810 Acute pulmonary edema: Secondary | ICD-10-CM

## 2020-05-01 DIAGNOSIS — N179 Acute kidney failure, unspecified: Secondary | ICD-10-CM | POA: Diagnosis not present

## 2020-05-01 DIAGNOSIS — I714 Abdominal aortic aneurysm, without rupture: Secondary | ICD-10-CM | POA: Diagnosis not present

## 2020-05-01 DIAGNOSIS — J9602 Acute respiratory failure with hypercapnia: Secondary | ICD-10-CM

## 2020-05-01 LAB — CBC WITH DIFFERENTIAL/PLATELET
Abs Immature Granulocytes: 0.23 10*3/uL — ABNORMAL HIGH (ref 0.00–0.07)
Basophils Absolute: 0 10*3/uL (ref 0.0–0.1)
Basophils Relative: 0 %
Eosinophils Absolute: 0 10*3/uL (ref 0.0–0.5)
Eosinophils Relative: 0 %
HCT: 31.1 % — ABNORMAL LOW (ref 39.0–52.0)
Hemoglobin: 9.5 g/dL — ABNORMAL LOW (ref 13.0–17.0)
Immature Granulocytes: 2 %
Lymphocytes Relative: 3 %
Lymphs Abs: 0.4 10*3/uL — ABNORMAL LOW (ref 0.7–4.0)
MCH: 31.4 pg (ref 26.0–34.0)
MCHC: 30.5 g/dL (ref 30.0–36.0)
MCV: 102.6 fL — ABNORMAL HIGH (ref 80.0–100.0)
Monocytes Absolute: 0.6 10*3/uL (ref 0.1–1.0)
Monocytes Relative: 5 %
Neutro Abs: 12.7 10*3/uL — ABNORMAL HIGH (ref 1.7–7.7)
Neutrophils Relative %: 90 %
Platelets: 416 10*3/uL — ABNORMAL HIGH (ref 150–400)
RBC: 3.03 MIL/uL — ABNORMAL LOW (ref 4.22–5.81)
RDW: 17.2 % — ABNORMAL HIGH (ref 11.5–15.5)
WBC: 14 10*3/uL — ABNORMAL HIGH (ref 4.0–10.5)
nRBC: 0 % (ref 0.0–0.2)

## 2020-05-01 LAB — COMPREHENSIVE METABOLIC PANEL
ALT: 18 U/L (ref 0–44)
AST: 21 U/L (ref 15–41)
Albumin: 1.7 g/dL — ABNORMAL LOW (ref 3.5–5.0)
Alkaline Phosphatase: 96 U/L (ref 38–126)
Anion gap: 12 (ref 5–15)
BUN: 35 mg/dL — ABNORMAL HIGH (ref 8–23)
CO2: 18 mmol/L — ABNORMAL LOW (ref 22–32)
Calcium: 7.9 mg/dL — ABNORMAL LOW (ref 8.9–10.3)
Chloride: 110 mmol/L (ref 98–111)
Creatinine, Ser: 2.5 mg/dL — ABNORMAL HIGH (ref 0.61–1.24)
GFR, Estimated: 25 mL/min — ABNORMAL LOW (ref >60)
Glucose, Bld: 186 mg/dL — ABNORMAL HIGH (ref 70–99)
Potassium: 3.4 mmol/L — ABNORMAL LOW (ref 3.5–5.1)
Sodium: 140 mmol/L (ref 135–145)
Total Bilirubin: 0.5 mg/dL (ref 0.3–1.2)
Total Protein: 5.2 g/dL — ABNORMAL LOW (ref 6.5–8.1)

## 2020-05-01 LAB — POCT I-STAT 7, (LYTES, BLD GAS, ICA,H+H)
Acid-base deficit: 6 mmol/L — ABNORMAL HIGH (ref 0.0–2.0)
Acid-base deficit: 5 mmol/L — ABNORMAL HIGH (ref 0.0–2.0)
Bicarbonate: 22.3 mmol/L (ref 20.0–28.0)
Bicarbonate: 22.2 mmol/L (ref 20.0–28.0)
Calcium, Ion: 1.2 mmol/L (ref 1.15–1.40)
Calcium, Ion: 1.21 mmol/L (ref 1.15–1.40)
HCT: 33 % — ABNORMAL LOW (ref 39.0–52.0)
HCT: 25 % — ABNORMAL LOW (ref 39.0–52.0)
Hemoglobin: 11.2 g/dL — ABNORMAL LOW (ref 13.0–17.0)
Hemoglobin: 8.5 g/dL — ABNORMAL LOW (ref 13.0–17.0)
O2 Saturation: 93 %
O2 Saturation: 92 %
Patient temperature: 99.4
Patient temperature: 99.3
Potassium: 3.6 mmol/L (ref 3.5–5.1)
Potassium: 3.5 mmol/L (ref 3.5–5.1)
Sodium: 143 mmol/L (ref 135–145)
Sodium: 145 mmol/L (ref 135–145)
TCO2: 24 mmol/L (ref 22–32)
TCO2: 24 mmol/L (ref 22–32)
pCO2 arterial: 55.6 mmHg — ABNORMAL HIGH (ref 32.0–48.0)
pCO2 arterial: 52.7 mmHg — ABNORMAL HIGH (ref 32.0–48.0)
pH, Arterial: 7.215 — ABNORMAL LOW (ref 7.350–7.450)
pH, Arterial: 7.236 — ABNORMAL LOW (ref 7.350–7.450)
pO2, Arterial: 85 mmHg (ref 83.0–108.0)
pO2, Arterial: 76 mmHg — ABNORMAL LOW (ref 83.0–108.0)

## 2020-05-01 LAB — RENAL FUNCTION PANEL
Albumin: 1.6 g/dL — ABNORMAL LOW (ref 3.5–5.0)
Albumin: 1.8 g/dL — ABNORMAL LOW (ref 3.5–5.0)
Anion gap: 10 (ref 5–15)
Anion gap: 8 (ref 5–15)
BUN: 34 mg/dL — ABNORMAL HIGH (ref 8–23)
BUN: 35 mg/dL — ABNORMAL HIGH (ref 8–23)
CO2: 22 mmol/L (ref 22–32)
CO2: 22 mmol/L (ref 22–32)
Calcium: 8.1 mg/dL — ABNORMAL LOW (ref 8.9–10.3)
Calcium: 7.8 mg/dL — ABNORMAL LOW (ref 8.9–10.3)
Chloride: 110 mmol/L (ref 98–111)
Chloride: 113 mmol/L — ABNORMAL HIGH (ref 98–111)
Creatinine, Ser: 2.4 mg/dL — ABNORMAL HIGH (ref 0.61–1.24)
Creatinine, Ser: 2.51 mg/dL — ABNORMAL HIGH (ref 0.61–1.24)
GFR, Estimated: 27 mL/min — ABNORMAL LOW (ref >60)
GFR, Estimated: 25 mL/min — ABNORMAL LOW (ref >60)
Glucose, Bld: 171 mg/dL — ABNORMAL HIGH (ref 70–99)
Glucose, Bld: 112 mg/dL — ABNORMAL HIGH (ref 70–99)
Phosphorus: 5.7 mg/dL — ABNORMAL HIGH (ref 2.5–4.6)
Phosphorus: 5.9 mg/dL — ABNORMAL HIGH (ref 2.5–4.6)
Potassium: 3.2 mmol/L — ABNORMAL LOW (ref 3.5–5.1)
Potassium: 4 mmol/L (ref 3.5–5.1)
Sodium: 142 mmol/L (ref 135–145)
Sodium: 143 mmol/L (ref 135–145)

## 2020-05-01 LAB — CBC
HCT: 30.9 % — ABNORMAL LOW (ref 39.0–52.0)
Hemoglobin: 9.4 g/dL — ABNORMAL LOW (ref 13.0–17.0)
MCH: 31.4 pg (ref 26.0–34.0)
MCHC: 30.4 g/dL (ref 30.0–36.0)
MCV: 103.3 fL — ABNORMAL HIGH (ref 80.0–100.0)
Platelets: 458 10*3/uL — ABNORMAL HIGH (ref 150–400)
RBC: 2.99 MIL/uL — ABNORMAL LOW (ref 4.22–5.81)
RDW: 17.4 % — ABNORMAL HIGH (ref 11.5–15.5)
WBC: 13.6 10*3/uL — ABNORMAL HIGH (ref 4.0–10.5)
nRBC: 0 % (ref 0.0–0.2)

## 2020-05-01 LAB — LACTIC ACID, PLASMA: Lactic Acid, Venous: 1.3 mmol/L (ref 0.5–1.9)

## 2020-05-01 LAB — BLOOD GAS, ARTERIAL
Acid-base deficit: 6.8 mmol/L — ABNORMAL HIGH (ref 0.0–2.0)
Bicarbonate: 21.3 mmol/L (ref 20.0–28.0)
Drawn by: 331761
FIO2: 36
O2 Saturation: 91.5 %
Patient temperature: 36.6
pCO2 arterial: 67.8 mmHg (ref 32.0–48.0)
pH, Arterial: 7.121 — CL (ref 7.350–7.450)
pO2, Arterial: 74.4 mmHg — ABNORMAL LOW (ref 83.0–108.0)

## 2020-05-01 LAB — CULTURE, BLOOD (ROUTINE X 2)
Culture: NO GROWTH
Culture: NO GROWTH
Special Requests: ADEQUATE

## 2020-05-01 LAB — GLUCOSE, CAPILLARY
Glucose-Capillary: 161 mg/dL — ABNORMAL HIGH (ref 70–99)
Glucose-Capillary: 110 mg/dL — ABNORMAL HIGH (ref 70–99)
Glucose-Capillary: 111 mg/dL — ABNORMAL HIGH (ref 70–99)

## 2020-05-01 LAB — AMMONIA: Ammonia: 66 umol/L — ABNORMAL HIGH (ref 9–35)

## 2020-05-01 LAB — PROCALCITONIN: Procalcitonin: 0.34 ng/mL

## 2020-05-01 LAB — MAGNESIUM: Magnesium: 1.6 mg/dL — ABNORMAL LOW (ref 1.7–2.4)

## 2020-05-01 LAB — BRAIN NATRIURETIC PEPTIDE: B Natriuretic Peptide: 461.1 pg/mL — ABNORMAL HIGH (ref 0.0–100.0)

## 2020-05-01 LAB — C3 COMPLEMENT: C3 Complement: 171 mg/dL — ABNORMAL HIGH (ref 82–167)

## 2020-05-01 MED ORDER — APIXABAN 2.5 MG PO TABS
2.5000 mg | ORAL_TABLET | Freq: Two times a day (BID) | ORAL | Status: DC
  Administered 2020-05-01 – 2020-05-06 (×11): 2.5 mg
  Filled 2020-05-01 (×11): qty 1

## 2020-05-01 MED ORDER — POTASSIUM CHLORIDE 20 MEQ/15ML (10%) PO SOLN
60.0000 meq | Freq: Once | ORAL | Status: AC
  Administered 2020-05-01: 60 meq
  Filled 2020-05-01: qty 45

## 2020-05-01 MED ORDER — PROSOURCE TF PO LIQD
45.0000 mL | Freq: Two times a day (BID) | ORAL | Status: DC
  Administered 2020-05-01 – 2020-05-03 (×6): 45 mL
  Filled 2020-05-01 (×6): qty 45

## 2020-05-01 MED ORDER — FUROSEMIDE 10 MG/ML IJ SOLN
80.0000 mg | Freq: Four times a day (QID) | INTRAMUSCULAR | Status: AC
  Administered 2020-05-01 (×2): 80 mg via INTRAVENOUS
  Filled 2020-05-01 (×2): qty 8

## 2020-05-01 MED ORDER — ALBUMIN HUMAN 25 % IV SOLN
25.0000 g | Freq: Four times a day (QID) | INTRAVENOUS | Status: AC
  Administered 2020-05-01 (×2): 25 g via INTRAVENOUS
  Filled 2020-05-01 (×2): qty 100

## 2020-05-01 MED ORDER — POTASSIUM CHLORIDE CRYS ER 20 MEQ PO TBCR: 30.0000 meq | EXTENDED_RELEASE_TABLET | Freq: Once | ORAL | Status: DC

## 2020-05-01 MED ORDER — AMIODARONE HCL IN DEXTROSE 360-4.14 MG/200ML-% IV SOLN
60.0000 mg/h | INTRAVENOUS | Status: AC
  Administered 2020-05-01 – 2020-05-02 (×2): 60 mg/h via INTRAVENOUS
  Filled 2020-05-01 (×2): qty 200

## 2020-05-01 MED ORDER — POTASSIUM CHLORIDE 20 MEQ/15ML (10%) PO SOLN
20.0000 meq | Freq: Once | ORAL | Status: AC
  Administered 2020-05-01: 20 meq
  Filled 2020-05-01: qty 15

## 2020-05-01 MED ORDER — AMIODARONE LOAD VIA INFUSION
150.0000 mg | Freq: Once | INTRAVENOUS | Status: AC
  Administered 2020-05-01: 150 mg via INTRAVENOUS
  Filled 2020-05-01: qty 83.34

## 2020-05-01 MED ORDER — NALOXONE HCL 0.4 MG/ML IJ SOLN
INTRAMUSCULAR | Status: AC
  Filled 2020-05-01: qty 1

## 2020-05-01 MED ORDER — POTASSIUM CHLORIDE 10 MEQ/100ML IV SOLN
10.0000 meq | INTRAVENOUS | Status: DC
  Administered 2020-05-01: 10 meq via INTRAVENOUS
  Filled 2020-05-01 (×2): qty 100

## 2020-05-01 MED ORDER — AMIODARONE HCL IN DEXTROSE 360-4.14 MG/200ML-% IV SOLN
30.0000 mg/h | INTRAVENOUS | Status: DC
  Administered 2020-05-02 (×2): 30 mg/h via INTRAVENOUS
  Filled 2020-05-01 (×2): qty 200

## 2020-05-01 MED ORDER — PANTOPRAZOLE SODIUM 40 MG PO PACK
40.0000 mg | PACK | Freq: Every day | ORAL | Status: DC
  Administered 2020-05-02 – 2020-05-25 (×23): 40 mg
  Filled 2020-05-01 (×24): qty 20

## 2020-05-01 MED ORDER — POTASSIUM CHLORIDE 20 MEQ PO PACK
40.0000 meq | PACK | Freq: Once | ORAL | Status: AC
  Administered 2020-05-01: 40 meq via ORAL
  Filled 2020-05-01: qty 2

## 2020-05-01 MED ORDER — NALOXONE HCL 0.4 MG/ML IJ SOLN
0.4000 mg | INTRAMUSCULAR | Status: DC | PRN
  Administered 2020-05-01: 0.4 mg via INTRAVENOUS
  Filled 2020-05-01: qty 1

## 2020-05-01 MED ORDER — NALOXONE HCL 0.4 MG/ML IJ SOLN
0.4000 mg | INTRAMUSCULAR | Status: DC | PRN
  Administered 2020-05-01: 0.4 mg via INTRAVENOUS

## 2020-05-01 MED ORDER — OXYMETAZOLINE HCL 0.05 % NA SOLN
1.0000 | Freq: Two times a day (BID) | NASAL | Status: DC | PRN
  Filled 2020-05-01: qty 30

## 2020-05-01 MED ORDER — UMECLIDINIUM BROMIDE 62.5 MCG/INH IN AEPB
1.0000 | INHALATION_SPRAY | Freq: Every day | RESPIRATORY_TRACT | Status: DC
  Administered 2020-05-02 – 2020-05-03 (×2): 1 via RESPIRATORY_TRACT
  Filled 2020-05-01: qty 7

## 2020-05-01 MED ORDER — VITAL 1.5 CAL PO LIQD
1000.0000 mL | ORAL | Status: DC
  Administered 2020-05-01 – 2020-05-21 (×13): 1000 mL
  Filled 2020-05-01 (×12): qty 1000

## 2020-05-01 MED ORDER — LACTULOSE 10 GM/15ML PO SOLN
30.0000 g | Freq: Three times a day (TID) | ORAL | Status: DC
  Administered 2020-05-01 (×3): 30 g
  Filled 2020-05-01 (×4): qty 45

## 2020-05-01 NOTE — Progress Notes (Addendum)
Progress Note  Patient Name: Carlos Olson Date of Encounter: 05/01/2020  [facility] Cardiologist: Werner Lean, MD   Subjective   Upon entering the room, patient hypotensive to 88 systolic and minimally responsive to sternal rub.  Inpatient Medications    Scheduled Meds: . acetaminophen  650 mg Oral Q6H   Or  . acetaminophen  325-650 mg Rectal Q6H  . amiodarone  200 mg Oral BID  . apixaban  2.5 mg Oral BID  . atorvastatin  10 mg Oral QHS  . chlorhexidine  15 mL Mouth Rinse BID  . Chlorhexidine Gluconate Cloth  6 each Topical Daily  . darbepoetin (ARANESP) injection - NON-DIALYSIS  100 mcg Subcutaneous Q Mon-1800  . feeding supplement  237 mL Oral TID BM  . fluticasone furoate-vilanterol  1 puff Inhalation Daily  . lidocaine  2 patch Transdermal Q24H  . mouth rinse  15 mL Mouth Rinse q12n4p  . metoprolol tartrate  62.5 mg Oral BID  . pantoprazole  40 mg Oral Daily  . potassium chloride  40 mEq Oral BID  . risperiDONE  0.25 mg Sublingual BID  . sodium bicarbonate  1,300 mg Oral BID   Continuous Infusions: . sodium chloride 250 mL (04/23/20 0343)  . dextrose 125 mL/hr at 04/30/20 0900  . fluconazole (DIFLUCAN) IV 200 mg (05/01/20 0248)  . piperacillin-tazobactam (ZOSYN)  IV 3.375 g (05/01/20 0100)  . vancomycin 1,000 mg (04/30/20 2252)   PRN Meds: alum & mag hydroxide-simeth, bisacodyl, Gerhardt's butt cream, guaiFENesin-dextromethorphan, HYDROmorphone (DILAUDID) injection, ondansetron, phenol, sodium phosphate, traZODone   Vital Signs    Vitals:   05/01/20 0041 05/01/20 0400 05/01/20 0756 05/01/20 0757  BP: 139/78 (!) 103/57 (!) 86/46 (!) 88/52  Pulse: 73  88 85  Resp: 18  13 15   Temp: 98.9 F (37.2 C) 99.1 F (37.3 C) 97.9 F (36.6 C)   TempSrc: Oral Oral Oral   SpO2: 100%  100% 100%  Weight:      Height:        Intake/Output Summary (Last 24 hours) at 05/01/2020 3976 Last data filed at 05/01/2020 0500 Gross per 24 hour  Intake 2960.59 ml   Output 1775 ml  Net 1185.59 ml   Last 3 Weights 04/29/2020 04/28/2020 04/26/2020  Weight (lbs) (No Data) (No Data) 208 lb 12.4 oz  Weight (kg) (No Data) (No Data) 94.7 kg      Telemetry    Afib with rates 100-110s overnight, then better controlled starting ~0545 to the 80-90s - Personally Reviewed  ECG    No new tracings - Personally Reviewed  Physical Exam   GEN: minimally responsive to sternal rub   Neck: + JVD Cardiac: irregular rhythm, regular rate Respiratory: coarse sounds throughout GI: Soft, nontender, non-distended  MS:LE edema, L > R UE edema Neuro: obtunded, minimally responsive to sternal rub  Psych:  Minimally responsive  Labs    High Sensitivity Troponin:  No results for input(s): TROPONINIHS in the last 720 hours.    Chemistry Recent Labs  Lab 04/28/20 0328 04/28/20 0328 04/28/20 0408 04/29/20 0551 04/30/20 0339  NA 149*   < > 150* 149* 148*  K 3.5   < > 3.2* 3.3* 3.1*  CL 121*  --   --  119* 119*  CO2 17*  --   --  18* 20*  GLUCOSE 102*  --   --  123* 115*  BUN 40*  --   --  47* 42*  CREATININE 2.44*  --   --  2.60* 2.40*  CALCIUM 8.1*  --   --  8.3* 8.3*  ALBUMIN  --   --   --  1.7* 1.6*  GFRNONAA 26*  --   --  24* 27*  ANIONGAP 11  --   --  12 9   < > = values in this interval not displayed.     Hematology Recent Labs  Lab 04/28/20 0205 04/28/20 0205 04/28/20 0408 04/29/20 0551 04/30/20 0339  WBC 15.6*  --   --  15.9* 13.2*  RBC 2.56*  --   --  2.98* 3.04*  HGB 8.0*   < > 7.8* 9.3* 9.5*  HCT 25.4*   < > 23.0* 29.1* 29.5*  MCV 99.2  --   --  97.7 97.0  MCH 31.3  --   --  31.2 31.3  MCHC 31.5  --   --  32.0 32.2  RDW 16.4*  --   --  17.8* 17.3*  PLT 309  --   --  358 379   < > = values in this interval not displayed.    BNPNo results for input(s): BNP, PROBNP in the last 168 hours.   DDimer No results for input(s): DDIMER in the last 168 hours.   Radiology    VAS US RENAL ARTERY DUPLEX  Result Date:  04/30/2020 ABDOMINAL VISCERAL Indications: AKI. 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 Other Factors: AAA. Limitations: Air/bowel gas, obesity, shortness of breath, patient position, midline incision, and restricted mobility. Comparison Study: No prior study Performing Technologist: Maudry Mayhew MHA, RDMS, RVT, RDCS  Examination Guidelines: A complete evaluation includes B-mode imaging, spectral Doppler, color Doppler, and power Doppler as needed of all accessible portions of each vessel. Bilateral testing is considered an integral part of a complete examination. Limited examinations for reoccurring indications may be performed as noted.  Duplex Findings: +----------------------+--------+--------+------+------------------+ Mesenteric            PSV cm/sEDV cm/sPlaque     Comments      +----------------------+--------+--------+------+------------------+ Aorta Prox                                  Unable to insonate +----------------------+--------+--------+------+------------------+ Celiac Artery Proximal                      Unable to insonate +----------------------+--------+--------+------+------------------+ SMA Proximal                                Unable to insonate +----------------------+--------+--------+------+------------------+    +------------------+--------+--------+------------------+ Right Renal ArteryPSV cm/sEDV cm/s     Comment       +------------------+--------+--------+------------------+ Origin                            Unable to insonate +------------------+--------+--------+------------------+ Proximal             68      16                      +------------------+--------+--------+------------------+ Mid                  59      13                      +------------------+--------+--------+------------------+  Distal               72      15                       +------------------+--------+--------+------------------+ +-----------------+--------+--------+------------------+ Left Renal ArteryPSV cm/sEDV cm/s     Comment       +-----------------+--------+--------+------------------+ Origin                           Unable to insonate +-----------------+--------+--------+------------------+ Proximal                         Unable to insonate +-----------------+--------+--------+------------------+ Mid                              Unable to insonate +-----------------+--------+--------+------------------+ Distal                           Unable to insonate +-----------------+--------+--------+------------------+ +------------+--------+-------+----+-----------+-----------------+--------+----+ Right KidneyPSV cm/sEDV    RI  Left KidneyPSV cm/s         EDV cm/sRI                       cm/s                                                +------------+--------+-------+----+-----------+-----------------+--------+----+ Upper Pole  25      6      0.76Upper Pole 17               8       0.50 +------------+--------+-------+----+-----------+-----------------+--------+----+ Mid         28      7      0.76Mid        20               4       0.79 +------------+--------+-------+----+-----------+-----------------+--------+----+ Lower Pole  18      7      0.60Lower Pole 18               3       0.83 +------------+--------+-------+----+-----------+-----------------+--------+----+ Hilar       20      7      0.67Hilar      Unable to                                                               insonate                      +------------+--------+-------+----+-----------+-----------------+--------+----+ +------------------+-------------------+------------------+--------------------+ Right Kidney                         Left Kidney                             +------------------+-------------------+------------------+--------------------+ RAR  RAR                                    +------------------+-------------------+------------------+--------------------+ RAR (manual)      Unable to calculateRAR (manual)      Unable to calculate                    due to difficulty                    due to difficulty                      insonating aorta                     insonating LRA and                                                          aorta                +------------------+-------------------+------------------+--------------------+ Cortex            13/4 cm/s          Cortex            15/5 cm/s            +------------------+-------------------+------------------+--------------------+ Cortex thickness                     Corex thickness                        +------------------+-------------------+------------------+--------------------+ Kidney length (cm)11.10              Kidney length (cm)11.80                +------------------+-------------------+------------------+--------------------+  Summary: Renal:  Right: No evidence of right renal artery stenosis. RRV flow present. Left:  Limited evaluation of the left kidney secondary to multiple        technical limitations listed above. Perfusion noted within        left kidney with mildly elevated resistive indices.  *See table(s) above for measurements and observations.  Diagnosing physician: Ruta Hinds MD  Electronically signed by Ruta Hinds MD on 04/30/2020 at 7:10:43 PM.    Final    VAS [place] UPPER EXTREMITY VENOUS DUPLEX  Result Date: 04/30/2020 UPPER VENOUS STUDY  Indications: Edema Limitations: Patient position, restricted mobility, bandaging. Comparison Study: No prior study Performing Technologist: Maudry Mayhew MHA, RDMS, RVT, RDCS  Examination Guidelines: A complete evaluation includes B-mode imaging, spectral Doppler,  color Doppler, and power Doppler as needed of all accessible portions of each vessel. Bilateral testing is considered an integral part of a complete examination. Limited examinations for reoccurring indications may be performed as noted.  Right Findings: +----------+------------+---------+-----------+----------+-------+ RIGHT     CompressiblePhasicitySpontaneousPropertiesSummary +----------+------------+---------+-----------+----------+-------+ Subclavian               Yes       Yes                      +----------+------------+---------+-----------+----------+-------+  Left Findings: +----------+------------+---------+-----------+----------+-------+ LEFT      CompressiblePhasicitySpontaneousPropertiesSummary +----------+------------+---------+-----------+----------+-------+ IJV  Full       Yes       Yes                      +----------+------------+---------+-----------+----------+-------+ Subclavian    Full       Yes       Yes                      +----------+------------+---------+-----------+----------+-------+ Axillary      Full       Yes       Yes                      +----------+------------+---------+-----------+----------+-------+ Brachial      Full       Yes       Yes                      +----------+------------+---------+-----------+----------+-------+ Radial        Full                                          +----------+------------+---------+-----------+----------+-------+ Ulnar         Full                                          +----------+------------+---------+-----------+----------+-------+ Cephalic      Full                                          +----------+------------+---------+-----------+----------+-------+ Basilic       Full                                          +----------+------------+---------+-----------+----------+-------+  Summary:  Right: No evidence of thrombosis in the subclavian.  Left:  No evidence of deep vein thrombosis in the upper extremity. No evidence of superficial vein thrombosis in the upper extremity.  *See table(s) above for measurements and observations.  Diagnosing physician: Ruta Hinds MD Electronically signed by Ruta Hinds MD on 04/30/2020 at 7:10:29 PM.    Final     Cardiac Studies    TEE 04/26/20: 1. Left ventricular ejection fraction, by estimation, is 50 to 55%. The  left ventricle has low normal function. The left ventricle has no regional  wall motion abnormalities.   2. Right ventricular systolic function is normal. The right ventricular  size is normal.   3. Left atrial size was moderately dilated. No left atrial/left atrial  appendage thrombus was detected.   4. Right atrial size was mildly dilated.   5. The mitral valve is normal in structure. Trivial mitral valve  regurgitation.   6. The aortic valve is tricuspid. There is mild calcification of the  aortic valve. There is mild thickening of the aortic valve. Aortic valve  regurgitation is trivial. Mild aortic valve sclerosis is present, with no  evidence of aortic valve stenosis.   7. Moderate-to-severe plaque visualized in the descending aorta and  aortic arch. Aortic dilatation noted. There is mild dilatation of the  aortic root, measuring 40  mm. There is mild to moderate dilatation of the  ascending aorta, measuring 41 mm.   8. Agitated saline contrast bubble study was positive with shunting  observed within 3-6 cardiac cycles suggestive of interatrial shunt.   Patient Profile     79 y.o. 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  Assessment & Plan    Post-op / ICU delirium Hypotension Aspiration PNA - this morning, patient had a change in mental status in the setting of hypotension - labs pending - CMP, CBC, ammonia,  lactic acid, procalcitonin, ABG - CXR pending - agree with transfer to ICU - patient currently protecting his airway - IVF running at 125 cc/hr - given volume status and renal function, may need to involve nephrology   Post op Afib with RVR HTN Blood loss anemia Thoracic and aortic aneurysm - lopressor increased to 62.5 mg BID yesterday - will hold this for now - continue amiodarone 200 mg BID - may continue eliquis for now - no signs of bleeding, abdominal exam largely benign s/p thoracic aortic aneurysm repair   AKI - sCr 2.40 today with K 3.2 - repeat CMP pending  - given edema and IVF, may need nephology input - renal artery duplex yesterday without stenosis      For questions or updates, please contact [facility] Please consult [email or web address] for contact info under        Signed, Ledora Bottcher, PA  05/01/2020, 8:07 AM    Personally seen and examined. Agree with APP Provider above with the following comments:  Called to the bedside:  Patient hypotensive and unresponsive. Discussed with daughter- his blood pressure usually runs low and she just thought he was sleeping. Discussed with daughter:  He always has low blood pressure and she thought he was sleeping. Patient not rousable this AM -SBP ~ 80s has been receive 125 cc IVF for at least 24 hours; heart has improve to 84 and a fib on telementry -Awakens to sternal rub, no tense abdomen no R groin hematoma GCS of 8 - Sent stat labs:  ABG notable for pH 7.1 and CO2 68 - Sent BMPCreatinine increased to 2.5: Albumin 1.7 Albumin corrected AF 17.8, delta gap 5.8 and ratio 1 - Sent CXR with persistent infiltrate on broad spectrum ABX (including fungal coverage; send Procalcitonin - Sent Lactate- 1.3 - Administrated Narcan without improvement in mental status - discussed with Charge move to ICU for closer monitoring  Hypotension with high anion gap metabolic acidosis with respiratory compensation With  hyperammonemia With AKI With aspiration PNA - agree with nephrology for diuresis 80 mg BID IV and stopping IVF - patient to received albumin - will defer to primary but low threshold for intubation  PAF - continue AC if unable to swallow PO would need heparin given his recent DCCV - continue amiodarone - BB held for hypotension  Discussed with family, nursing, and vascular MD.  CRITICAL CARE Performed by: Jonerik Sliker A Shulamit Donofrio  Total critical care time: 60 minutes. Critical care time was exclusive of separately billable procedures and treating other patients. Critical care was necessary to treat or prevent imminent or life-threatening deterioration. Critical care was time spent personally by me on the following activities: development of treatment plan with patient and/or surrogate as well as nursing, discussions with consultants, evaluation of patient's response to treatment, examination of  patient, obtaining history from patient or surrogate, ordering and performing treatments and interventions, ordering and review of laboratory studies, ordering and review of radiographic studies, pulse oximetry and re-evaluation of patient's condition.    Signed, Rudean Haskell, [place]  05/01/2020 10:48 AM

## 2020-05-01 NOTE — Progress Notes (Signed)
Change in condition: Hypotensive & lethargic w/ minimal response to pain  Rapid Response notified  MD notified

## 2020-05-01 NOTE — Progress Notes (Signed)
MD at bedside.  New orders received.  Labs drawn.  CXR in progress.   Transfer order received to ICU

## 2020-05-01 NOTE — Progress Notes (Signed)
PT Cancellation Note  Patient Details Name: Carlos Olson MRN: [id number] DOB: Aug 24, 1940   Cancelled Treatment:    Reason Eval/Treat Not Completed: (P) Medical issues which prohibited therapy;Patient not medically ready (Per chart rapid response call this am with transfer to ICU, will defer PT tx at this time and follow up per POC.)   Kaizen Ibsen Eli Hose 05/01/2020, 10:25 AM  Erasmo Leventhal , PTA Acute Rehabilitation Services Pager [phone] Office [phone]

## 2020-05-01 NOTE — Progress Notes (Signed)
NTS patient for copious secretions & patient woke up a little & started talking to [place]. Discussed with Dr Carlis Abbott & will hold off Bipap for now & draw ABG around 1300.

## 2020-05-01 NOTE — Progress Notes (Addendum)
Pharmacy Antibiotic Note  Carlos Olson is a 80 y.o. male admitted on 04/17/2020 with pneumonia.  Pharmacy has been consulted for Vancomycin and Zosyn dosing with possible PNA. Pt noted to have AKI but renal function has remained stable.  Plan: -Continue Zosyn 3.375g IV EI q8h -Continue vancomycin 1000mg  IV q24h -Continue fluconazole 200mg  IV q24h -Will check vancomycin trough tomorrow if continued with acute decompensation this am   Height: 6\' 3"  (190.5 cm) Weight:  (unable to weight ) IBW/kg (Calculated) : 84.5  Temp (24hrs), Avg:98.5 F (36.9 C), Min:97.9 F (36.6 C), Max:99.1 F (37.3 C)  Recent Labs  Lab 04/27/20 0044 04/28/20 0205 04/28/20 0328 04/29/20 0551 04/30/20 0339 05/01/20 0720 05/01/20 0914  WBC  --  15.6*  --  15.9* 13.2* 13.6* 14.0*  CREATININE   < >  --  2.44* 2.60* 2.40* 2.40* 2.50*  LATICACIDVEN  --   --   --   --   --   --  1.3   < > = values in this interval not displayed.    Estimated Creatinine Clearance: 28.2 mL/min (A) (by C-G formula based on SCr of 2.5 mg/dL (H)).    Allergies  Allergen Reactions  . Levaquin [Levofloxacin]     Aortic Aneurysm  . Sulfonamide Derivatives Itching  . Sulfasalazine Itching    Antimicrobials this admission: Vanc 10/30>> Zosyn 10/22>>10/27; restart 10/30>> Augmentin 10/27  Microbiology results: 10/28 Bcx > 10/28 sputum >neg  Thank you for allowing pharmacy to be a part of this patient's care.  Sherlon Handing, PharmD, BCPS Please see amion for complete clinical pharmacist phone list 05/01/2020 10:36 AM

## 2020-05-01 NOTE — Progress Notes (Addendum)
Progress Note    05/01/2020 7:21 AM 5 Days Post-Op  Subjective:  Somnolent this morning   Vitals:   05/01/20 0041 05/01/20 0400  BP: 139/78 (!) 103/57  Pulse: 73   Resp: 18   Temp: 98.9 F (37.2 C) 99.1 F (37.3 C)  SpO2: 100%    Physical Exam: Lungs:  Non labored Incisions:  abd incision and R groin incision c/d/i Extremities:  Palpable R ATA pulse; unable to palpate L pedal pulses however foot is warm with good cap refill Abdomen:  Soft, NT, ND Neurologic: A&O  CBC    Component Value Date/Time   WBC 13.2 (H) 04/30/2020 0339   RBC 3.04 (L) 04/30/2020 0339   HGB 9.5 (L) 04/30/2020 0339   HGB 12.1 (L) 08/20/2012 0610   HCT 29.5 (L) 04/30/2020 0339   HCT 44.0 08/02/2012 0853   PLT 379 04/30/2020 0339   PLT 158 08/20/2012 0610   MCV 97.0 04/30/2020 0339   MCV 99 08/02/2012 0853   MCH 31.3 04/30/2020 0339   MCHC 32.2 04/30/2020 0339   RDW 17.3 (H) 04/30/2020 0339   RDW 13.7 08/02/2012 0853   LYMPHSABS 0.5 (L) 04/22/2020 1023   MONOABS 1.1 (H) 04/22/2020 1023   EOSABS 0.0 04/22/2020 1023   BASOSABS 0.0 04/22/2020 1023    BMET    Component Value Date/Time   NA 148 (H) 04/30/2020 0339   NA 136 08/20/2012 0610   K 3.1 (L) 04/30/2020 0339   K 4.2 08/20/2012 0610   CL 119 (H) 04/30/2020 0339   CL 102 08/20/2012 0610   CO2 20 (L) 04/30/2020 0339   CO2 26 08/20/2012 0610   GLUCOSE 115 (H) 04/30/2020 0339   GLUCOSE 123 (H) 08/20/2012 0610   BUN 42 (H) 04/30/2020 0339   BUN 7 08/20/2012 0610   CREATININE 2.40 (H) 04/30/2020 0339   CREATININE 0.75 08/20/2012 0610   CALCIUM 8.3 (L) 04/30/2020 0339   CALCIUM 8.4 (L) 08/20/2012 0610   GFRNONAA 27 (L) 04/30/2020 0339   GFRNONAA >60 08/20/2012 0610   GFRAA >60 05/07/2017 0949   GFRAA >60 08/20/2012 0610    INR    Component Value Date/Time   INR 1.5 (H) 04/26/2020 1020   INR 1.0 08/02/2012 0853     Intake/Output Summary (Last 24 hours) at 05/01/2020 0721 Last data filed at 05/01/2020 0500 Gross per 24  hour  Intake 2960.59 ml  Output 1775 ml  Net 1185.59 ml     Assessment/Plan:  79 y.o. male is s/p open AAA repair and R CFA aneurysm repair 5 Days Post-Op   Feet are warm and well perfused; palpable R ATA pulse Heart: rate controlled this morning; continue Eliquis; Cardiology following GI: poor nutrition; continue to encouraged p.o. intake; dietician on board Renal: patient seems to be retaining fluid; about 350cc urine overnight; Nephrology following   Dagoberto Ligas, PA-C [facility] [phone] 05/01/2020 7:21 AM   I have independently interviewed and examined patient and agree with PA assessment and plan above.  Subsequently patient was transferred to the ICU for respiratory and mental status.  CO2 significantly elevated he was given Narcan with some response.  Patient is oriented to person and place with some redirection.  Overall he is maintaining his saturations does not appear to need intubation at this time.  His blood pressure and heart rate are adequate and his rate is better maintained today.  An NG tube has been placed location was confirmed and he has been given lactulose  to treat elevated ammonia level.  We will plan for core track placement tomorrow to initiate feeding.  Continue antibiotics for now.  Cardiology and critical care treatment of this patient much appreciated.  Jonda Alanis C. Donzetta Matters, MD [facility] [place] Office: [phone] Pager: [phone]

## 2020-05-01 NOTE — Progress Notes (Signed)
Report called to [facility] PA paged.  Reported ABG's have resulted.  Critical Care order received.

## 2020-05-01 NOTE — Progress Notes (Signed)
[facility] Progress Note    Assessment/ Plan:   AKI: Likely secondary to acute tubular injury in the context of hypotension, worsening anemia.  PVL unable to assess left renal artery but does appear to have flow in the parenchyma.  Right renal artery is okay.  C3 not decreased.. BL normal renal function.  Decrease intravascular volume likely playing a role.  Volume overloaded with worsening hypercarbia and hypoxia.  Chest x-ray with pulmonary edema.  Will need diuresis today -Peak Crt 3.1 now fluctuating likely from hemodynamic shifts. -Possible left renal artery compromise given it was not visualized on PVL but cannot say for sure -IV albumin 25 g twice daily with IV Lasix 80 mg twice daily to help with volume overload -Stop IV free water -BID electrolytes given IV lasix  Hypernatremia: Now normalized with a sodium of 143.  We will stop free water given his volume overload.  Metabolic acidosis/respiratory acidosis -Secondary to AKI.  Bicarb 18 today.  Hopefully will improve with diuresis.  Now also with hypercarbia and respiratory acidosis.  May benefit from positive pressure ventilation  H/o HTN:  Blood pressure variable.  Currently on metoprolol  Hypokalemia: Potassium 3.4 today.  Status post oral 40 mEq x2 of potassium yesterday.  Will give 30 mEq of IV potassium today.  Continue to monitor potassium twice daily while on diuretics.  Ideally switch to oral potassium once mental status improves  Anemia -Transfuse for Hgb<7 g/dL-transfused on 10/30.  On Aranesp 100 mcg weekly  A. fib.  Currently on amio, anticoagulation resumed-  Cards- s/p cardioversion but then had recurrence-continue with potassium repletion  Encephalopathy: ICU delirium & benzos, also with hypercarbia.  Management per primary service. 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  Yesterday was conversant.  Today is lethargic and not able to answer questions.  He was noted to be more confused and more hypoxic this morning.  Blood gas demonstrated hypercarbia and acidosis.  Because of the patient's instability he was transferred to the ICU.  Urine output continues to be good with 1.8 L of urine made but was net positive due to D5 water.  Sodium now improved.  Creatinine relatively stable at 2.4.    Objective:   BP (!) 95/57   Pulse 92   Temp 97.9 F (36.6 C) (Oral)   Resp 16   Ht 6\' 3"  (1.905 m)   Wt 94.7 kg   SpO2 93%   BMI 26.10 kg/m   Intake/Output Summary (Last 24 hours) at 05/01/2020 1025 Last data filed at 05/01/2020 0500 Gross per 24 hour  Intake 2960.59 ml  Output 1775 ml  Net 1185.59 ml   Weight change:   Physical Exam: Gen: Lying in bed, does not respond HEENT: dry mucosal membranes CVS: Normal rate, irregular rhythm Resp: Coarse bilateral breath sounds, snoring, bilateral chest rise ZJI:RCVE Ext: pitting edema in all 4 extremities Neuro: Somnolent, unable to assess orientation  Imaging: DG CHEST PORT 1 VIEW  Result Date: 05/01/2020 CLINICAL DATA:  Respiratory distress. EXAM: PORTABLE CHEST 1 VIEW COMPARISON:  04/27/2020. FINDINGS: Heart size stable. Diffuse bilateral scratched it diffuse severe bilateral pulmonary infiltrates/edema again noted without interim change. Bilateral pleural effusions. No pneumothorax. No acute bony abnormality. IMPRESSION: Diffuse severe bilateral pulmonary infiltrates/edema again noted without interim change. Bilateral pleural effusions. Electronically Signed   By: Marcello Moores  Register  On: 05/01/2020 09:29   VAS US RENAL ARTERY DUPLEX  Result Date: 04/30/2020 ABDOMINAL VISCERAL Indications: AKI. 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 Other Factors: AAA. Limitations: Air/bowel gas,  obesity, shortness of breath, patient position, midline incision, and restricted mobility. Comparison Study: No prior study Performing Technologist: Maudry Mayhew MHA, RDMS, RVT, RDCS  Examination Guidelines: A complete evaluation includes B-mode imaging, spectral Doppler, color Doppler, and power Doppler as needed of all accessible portions of each vessel. Bilateral testing is considered an integral part of a complete examination. Limited examinations for reoccurring indications may be performed as noted.  Duplex Findings: +----------------------+--------+--------+------+------------------+ Mesenteric            PSV cm/sEDV cm/sPlaque     Comments      +----------------------+--------+--------+------+------------------+ Aorta Prox                                  Unable to insonate +----------------------+--------+--------+------+------------------+ Celiac Artery Proximal                      Unable to insonate +----------------------+--------+--------+------+------------------+ SMA Proximal                                Unable to insonate +----------------------+--------+--------+------+------------------+    +------------------+--------+--------+------------------+ Right Renal ArteryPSV cm/sEDV cm/s     Comment       +------------------+--------+--------+------------------+ Origin                            Unable to insonate +------------------+--------+--------+------------------+ Proximal             68      16                      +------------------+--------+--------+------------------+ Mid                  59      13                      +------------------+--------+--------+------------------+ Distal               72      15                      +------------------+--------+--------+------------------+ +-----------------+--------+--------+------------------+ Left Renal ArteryPSV cm/sEDV cm/s     Comment        +-----------------+--------+--------+------------------+ Origin                           Unable to insonate +-----------------+--------+--------+------------------+ Proximal                         Unable to insonate +-----------------+--------+--------+------------------+ Mid                              Unable to insonate +-----------------+--------+--------+------------------+ Distal                           Unable to insonate +-----------------+--------+--------+------------------+ +------------+--------+-------+----+-----------+-----------------+--------+----+ Right KidneyPSV cm/sEDV    RI  Left KidneyPSV cm/s         EDV cm/sRI  cm/s                                                +------------+--------+-------+----+-----------+-----------------+--------+----+ Upper Pole  25      6      0.76Upper Pole 17               8       0.50 +------------+--------+-------+----+-----------+-----------------+--------+----+ Mid         28      7      0.76Mid        20               4       0.79 +------------+--------+-------+----+-----------+-----------------+--------+----+ Lower Pole  18      7      0.60Lower Pole 18               3       0.83 +------------+--------+-------+----+-----------+-----------------+--------+----+ Hilar       20      7      0.67Hilar      Unable to                                                               insonate                      +------------+--------+-------+----+-----------+-----------------+--------+----+ +------------------+-------------------+------------------+--------------------+ Right Kidney                         Left Kidney                            +------------------+-------------------+------------------+--------------------+ RAR                                  RAR                                     +------------------+-------------------+------------------+--------------------+ RAR (manual)      Unable to calculateRAR (manual)      Unable to calculate                    due to difficulty                    due to difficulty                      insonating aorta                     insonating LRA and                                                          aorta                +------------------+-------------------+------------------+--------------------+  Cortex            13/4 cm/s          Cortex            15/5 cm/s            +------------------+-------------------+------------------+--------------------+ Cortex thickness                     Corex thickness                        +------------------+-------------------+------------------+--------------------+ Kidney length (cm)11.10              Kidney length (cm)11.80                +------------------+-------------------+------------------+--------------------+  Summary: Renal:  Right: No evidence of right renal artery stenosis. RRV flow present. Left:  Limited evaluation of the left kidney secondary to multiple        technical limitations listed above. Perfusion noted within        left kidney with mildly elevated resistive indices.  *See table(s) above for measurements and observations.  Diagnosing physician: Ruta Hinds MD  Electronically signed by Ruta Hinds MD on 04/30/2020 at 7:10:43 PM.    Final    VAS [place] UPPER EXTREMITY VENOUS DUPLEX  Result Date: 04/30/2020 UPPER VENOUS STUDY  Indications: Edema Limitations: Patient position, restricted mobility, bandaging. Comparison Study: No prior study Performing Technologist: Maudry Mayhew MHA, RDMS, RVT, RDCS  Examination Guidelines: A complete evaluation includes B-mode imaging, spectral Doppler, color Doppler, and power Doppler as needed of all accessible portions of each vessel. Bilateral testing is considered an integral part of a complete examination.  Limited examinations for reoccurring indications may be performed as noted.  Right Findings: +----------+------------+---------+-----------+----------+-------+ RIGHT     CompressiblePhasicitySpontaneousPropertiesSummary +----------+------------+---------+-----------+----------+-------+ Subclavian               Yes       Yes                      +----------+------------+---------+-----------+----------+-------+  Left Findings: +----------+------------+---------+-----------+----------+-------+ LEFT      CompressiblePhasicitySpontaneousPropertiesSummary +----------+------------+---------+-----------+----------+-------+ IJV           Full       Yes       Yes                      +----------+------------+---------+-----------+----------+-------+ Subclavian    Full       Yes       Yes                      +----------+------------+---------+-----------+----------+-------+ Axillary      Full       Yes       Yes                      +----------+------------+---------+-----------+----------+-------+ Brachial      Full       Yes       Yes                      +----------+------------+---------+-----------+----------+-------+ Radial        Full                                          +----------+------------+---------+-----------+----------+-------+ Ulnar  Full                                          +----------+------------+---------+-----------+----------+-------+ Cephalic      Full                                          +----------+------------+---------+-----------+----------+-------+ Basilic       Full                                          +----------+------------+---------+-----------+----------+-------+  Summary:  Right: No evidence of thrombosis in the subclavian.  Left: No evidence of deep vein thrombosis in the upper extremity. No evidence of superficial vein thrombosis in the upper extremity.  *See table(s) above for  measurements and observations.  Diagnosing physician: Ruta Hinds MD Electronically signed by Ruta Hinds MD on 04/30/2020 at 7:10:29 PM.    Final     Labs: BMET Recent Labs  Lab 04/26/20 0037 04/26/20 0037 04/27/20 8938 04/28/20 1017 04/28/20 0408 04/29/20 5102 04/30/20 0339 05/01/20 0720 05/01/20 0914  NA 147*   < > 147* 149* 150* 149* 148* 142 140  K 3.6   < > 3.3* 3.5 3.2* 3.3* 3.1* 3.2* 3.4*  CL 119*  --  119* 121*  --  119* 119* 110 110  CO2 17*  --  19* 17*  --  18* 20* 22 18*  GLUCOSE 115*  --  123* 102*  --  123* 115* 171* 186*  BUN 38*  --  45* 40*  --  47* 42* 34* 35*  CREATININE 2.24*  --  2.60* 2.44*  --  2.60* 2.40* 2.40* 2.50*  CALCIUM 7.7*  --  7.9* 8.1*  --  8.3* 8.3* 8.1* 7.9*  PHOS  --   --   --   --   --  4.0 3.3 5.7*  --    < > = values in this interval not displayed.   CBC Recent Labs  Lab 04/29/20 0551 04/30/20 0339 05/01/20 0720 05/01/20 0914  WBC 15.9* 13.2* 13.6* 14.0*  NEUTROABS  --   --   --  12.7*  HGB 9.3* 9.5* 9.4* 9.5*  HCT 29.1* 29.5* 30.9* 31.1*  MCV 97.7 97.0 103.3* 102.6*  PLT 358 379 458* 416*    Medications:    . acetaminophen  650 mg Oral Q6H   Or  . acetaminophen  325-650 mg Rectal Q6H  . amiodarone  200 mg Oral BID  . apixaban  2.5 mg Oral BID  . atorvastatin  10 mg Oral QHS  . chlorhexidine  15 mL Mouth Rinse BID  . Chlorhexidine Gluconate Cloth  6 each Topical Daily  . darbepoetin (ARANESP) injection - NON-DIALYSIS  100 mcg Subcutaneous Q Mon-1800  . feeding supplement  237 mL Oral TID BM  . fluticasone furoate-vilanterol  1 puff Inhalation Daily  . furosemide  80 mg Intravenous Q6H  . lidocaine  2 patch Transdermal Q24H  . mouth rinse  15 mL Mouth Rinse q12n4p  . metoprolol tartrate  62.5 mg Oral BID  . pantoprazole  40 mg Oral Daily  . risperiDONE  0.25 mg Sublingual BID  . sodium bicarbonate  1,300 mg  Oral BID      [facility] 05/01/2020, 10:25 AM

## 2020-05-01 NOTE — Progress Notes (Addendum)
NAME:  Carlos Olson, MRN:  [id number], DOB:  December 20, 1940, LOS: 23 ADMISSION DATE:  04/17/2020, CONSULTATION DATE: 10/21 REFERRING MD: Dr. Donzetta Matters, CHIEF COMPLAINT: AKI with mild hypoxia  Brief History   79 year old male who presented 10/18 for elective endovascular repair abdominal aortic aneurysm with Dr. Donzetta Matters. PCCM consulted 10/21 for assistance in medical management.  Hospital course complicated by AKI, metabolic encephalopathy, hypoxia/ RLL pneumonia, and hypotension. Treated in ICU for sepsis, AFRVR, underwent cardioversion, ABX course. Moved out of ICU. Ongoing course significant for AKI, AF, debility. Then 11/2 he became encephalopathic overnight and was transferred back to ICU. PCCM re-consulted.    Past Medical History  AAA History of prostate cancer Heart murmur Diverticulitis COPD CAD atherosclerosis  Significant Hospital Events   Admitted 10/18 for elective AAA repair 10/19 OR   Consults:  Cardiology PCCM Nephrology GI  Procedures:  Endovascular AAA repair 10/19  Significant Diagnostic Tests:  04/21/2020 CT A/P >> Status post abdominal aortic repair with aortobifemoral bypass graft. Some stranding is noted in the operative site likely related to postoperative change. No sizable hematoma is seen. Diverticulosis without diverticulitis. Near complete consolidation of the right lower lobe. Small left pleural effusion is seen with left basilar atelectasis.  10/25 CTH >> 1. Stable.  No acute intracranial abnormality. 2. Atrophy with chronic small vessel white matter ischemic disease.  Micro Data:  Blood 10/28 > negative Sputum 10/28 > few candida  Antimicrobials:  10/19 cefazolin 10/22 zosyn > 10/26 Zosyn 10/29 >>> Fluconazole 10/29 >>>  Interim history/subjective:  Less responsive this morning, hypercarbic, transferred to ICU.    Objective   Blood pressure (!) 95/57, pulse 92, temperature 97.9 F (36.6 C), temperature source Oral, resp. rate 16, height 6\' 3"   (1.905 m), weight 94.7 kg, SpO2 93 %.        Intake/Output Summary (Last 24 hours) at 05/01/2020 1054 Last data filed at 05/01/2020 0500 Gross per 24 hour  Intake 2720.59 ml  Output 1775 ml  Net 945.59 ml   Filed Weights   04/24/20 0453 04/26/20 1104  Weight: 94.7 kg 94.7 kg   Examination: General: frail elderly male resting in bed HEENT: [place]/AT, PERRL, no JVD Neuro: Somnolent, will arouse somewhat to verbal. After NTS/Narcan he is awake, answering questions, and following commands.  CV: IRIR, tachy, no MRG PULM:  Coarse throughout. Upper airway rattle cleared with suctioning and coughing.  GI: Soft, non-tender, non-distended. Incision CDI without s/s infection.  Extremities: Cool/Dry. 1+ pitting edema bilateral ankles. 3+ pitting edema RUE.   Resolved Hospital Problem list     Assessment & Plan:   Acute hypercarbic respiratory failure: likely driven by metabolic/toxic encephalopathy. Ammonia elevated, recent dilaudid administration in the setting of renal failure. Seems like he has had a positive response to narcan.  - Now transferred to ICU - Not currently requiring intubation - Not a great BiPAP candidate due to airway clearance issues - Repeat ABG one hour after aggressive suctioning, lactulose, and narcan.  - Sputum culture  Acute metabolic encephalopathy: due to dilaudid vs hyperammoniemia  - lactulose 30g TID - Consider statin DC given ammonia, but LFT have been ok. Trend.  - Narcan given x 2.  - repeat ABG - Close monitoring in ICU.   AKI: likely secondary to ATN in the setting of waxing and waning hemodynamics.  - Nephrology following - ongoing diuresis - Trend BMP - I & O  Atrial fibrillation with RVR: cardioverted 10/28 with successful conversion, but now back in AF.  -  Cardiology following - Anticoagulation with Eliquis - Amiodarone  Leukocytosis Possible aspiration pneumonia.  Few candida on sputum culture from 10/28 - Zosyn/Vancomycin day 4 -  fluconazole day 4 - Low threshold to DC vanco.  - Culture sputum - Nasotracheal suctioning.  - Frequent IS and flutter valve when able.   History of COPD not currently in acute exasperation Former smoker History of pulmonary nodules Primary pulmonologist is Dr. 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 Carlis Abbott.  Disposition: ICU  Labs   CBC: Recent Labs  Lab 04/28/20 0205 04/28/20 0205 04/28/20 0408 04/29/20 0551 04/30/20 0339 05/01/20 0720 05/01/20 0914  WBC 15.6*  --   --  15.9* 13.2* 13.6* 14.0*  NEUTROABS  --   --   --   --   --   --  12.7*  HGB 8.0*   < > 7.8* 9.3* 9.5* 9.4* 9.5*  HCT 25.4*   < > 23.0* 29.1* 29.5* 30.9* 31.1*  MCV 99.2  --   --  97.7 97.0 103.3* 102.6*  PLT 309  --   --  358 379 458* 416*   < > = values in this interval not displayed.    Basic Metabolic Panel: Recent Labs  Lab 04/25/20 0803 04/26/20 0037 04/28/20 0626 04/28/20 9485 04/28/20 0408 04/29/20 4627 04/30/20 0339 05/01/20 0720 05/01/20 0914  NA 149*   < > 149*   < > 150*  149* 148* 142 140  K 3.0*   < > 3.5   < > 3.2* 3.3* 3.1* 3.2* 3.4*  CL 120*   < > 121*  --   --  119* 119* 110 110  CO2 18*   < > 17*  --   --  18* 20* 22 18*  GLUCOSE 116*   < > 102*  --   --  123* 115* 171* 186*  BUN 37*   < > 40*  --   --  47* 42* 34* 35*  CREATININE 1.87*   < > 2.44*  --   --  2.60* 2.40* 2.40* 2.50*  CALCIUM 7.7*   < > 8.1*  --   --  8.3* 8.3* 8.1* 7.9*  MG 2.0  --   --   --   --   --   --   --   --   PHOS  --   --   --   --   --  4.0 3.3 5.7*  --    < > = values in this interval not displayed.   GFR: Estimated Creatinine Clearance: 28.2 mL/min (A) (by C-G formula based on SCr of 2.5 mg/dL (H)). Recent Labs  Lab 04/29/20 0551 04/30/20 0339 05/01/20 0720  05/01/20 0914  WBC 15.9* 13.2* 13.6* 14.0*  LATICACIDVEN  --   --   --  1.3    Liver Function Tests: Recent Labs  Lab 04/29/20 0551 04/30/20 0339 05/01/20 0720 05/01/20 0914  AST  --   --   --  21  ALT  --   --   --  18  ALKPHOS  --   --   --  96  BILITOT  --   --   --  0.5  PROT  --   --   --  5.2*  ALBUMIN 1.7* 1.6* 1.6* 1.7*   No results for input(s): LIPASE, AMYLASE in the last 168 hours. Recent Labs  Lab 05/01/20 0914  AMMONIA 66*    ABG    Component Value Date/Time   PHART 7.121 (LL) 05/01/2020 0844   PCO2ART 67.8 (HH) 05/01/2020 0844   PO2ART 74.4 (L) 05/01/2020 0844   HCO3 21.3 05/01/2020 0844   TCO2 18 (L) 04/28/2020 0408   ACIDBASEDEF 6.8 (H) 05/01/2020 0844   O2SAT 91.5 05/01/2020 0844     Coagulation Profile: Recent Labs  Lab 04/26/20 1020  INR 1.5*    Cardiac Enzymes: No results for input(s): CKTOTAL, CKMB, CKMBINDEX, TROPONINI in the last 168 hours.  HbA1C: No results found for: HGBA1C  CBG: Recent Labs  Lab 04/24/20 1108 04/24/20 1533 04/29/20 2013 05/01/20 1014  GLUCAP 124* 120* 113* 161*   Critical care time: 56 minutes   Georgann Housekeeper, AGACNP-BC [place] for personal pager PCCM on call pager [phone]  05/01/2020 10:59 AM

## 2020-05-01 NOTE — Progress Notes (Addendum)
eLink Physician-Brief Progress Note Patient Name: MIACHEL NARDELLI DOB: Nov 03, 1940 MRN: [id number]   Date of Service  05/01/2020  HPI/Events of Note  AFIB with RVR - Ventricular rate = 137.  Currently not on any rate control agent. BP = 109/45 with MAP = 70. Last K+ at 4:09 PM = 4.0.  eICU Interventions  Plan: 1. Amiodarone IV load and infusion. 2. Mg++ level STAT.      Intervention Category Major Interventions: Arrhythmia - evaluation and management  Raechelle Sarti [place] 05/01/2020, 9:28 PM

## 2020-05-01 NOTE — Progress Notes (Signed)
Nutrition Follow-up  DOCUMENTATION CODES:   Severe malnutrition in context of acute illness/injury  INTERVENTION:   Plan for Cortrak placement tomorrow  Tube Feeding via NG/Cortrak: Vital 1.5 at 60 ml/hr Pro-Source 45 mL BID Provides 119 g of protein, 2240 kcals, 1094 mL  Meets 100% of estimated protein and calorie neds   NUTRITION DIAGNOSIS:   Severe Malnutrition related to acute illness (AAA repair surgery with minimal intake x 10 days since admission) as evidenced by moderate muscle depletion, energy intake < or equal to 50% for > or equal to 5 days.  Being addressed via TF   GOAL:   Patient will meet greater than or equal to 90% of their needs  Progressing  MONITOR:   PO intake, Supplement acceptance  REASON FOR ASSESSMENT:   Rounds    ASSESSMENT:   79 yo male admitted 10/18 for elective endovascular repair of AAA. PMH includes AAA, prostate Ca, heart murmur, diverticulitis, COPD, CAD, atherosclerosis, IBS.  11/2 Obtunded, hypercarbic, transferred to ICU, NG tube inserted  NPO, NG inserted today, plan for lactulose. Abd xray with tip in stomach. Also plan for TF if tube does not become dislodged. Plan for Cortrak tube with Bridle tomorrow  Intake has been minimal since admission. NPO on admission through 10/25. NG tube placed 10/25; trickle TF started: Osmolite 1.2 at 20 ml/h. NG tube accidentally removed 10/26. Clear liquids 10/26. NPO 10/28 for procedure, then advanced to dysphagia 3 with thin liquids  Recorded po intake 0-50% of meals since pt last assessed. Pt currently unable to take po  Ammonia elevated, lactulose started today  Labs: potassium 3.4 (L), Creatinine 2.50, BUN 35, ammonia 66 Meds: lasix, lactulose, sodium bicarb tab   Diet Order:   Diet Order            Diet NPO time specified  Diet effective now                 EDUCATION NEEDS:   No education needs have been identified at this time  Skin:  Skin Assessment: Reviewed RN  Assessment (abd & R groin surgical incisions)  Last BM:  11/2 rectal tube  Height:   Ht Readings from Last 1 Encounters:  04/26/20 6\' 3"  (1.905 m)    Weight:   Wt Readings from Last 1 Encounters:  04/11/20 95.5 kg    Ideal Body Weight:  89.1 kg  BMI:  Body mass index is 26.1 kg/m.  Estimated Nutritional Needs:   Kcal:  2200-2400  Protein:  110-130 gm  Fluid:  2.2-2.4 L    Kerman Passey MS, RDN, LDN, CNSC Registered Dietitian III Clinical Nutrition RD Pager and On-Call Pager Number Located in [place]

## 2020-05-01 NOTE — Significant Event (Signed)
Rapid Response Event Note   Reason for Call :  He is difficult to arouse, bp 80s  Initial Focused Assessment:  Patient is mostly obtunded.  He will raise his arm and grimace when he is gagged. He does some posturing. With significant stimulation he will open his eyes briefly and moan. Lung sounds rhonchi He will not cough on command Edematous CBG 161  BP 83/61  HR 90  RR 14  O2 sat 100% on [facility] MD and PA at bedside Dr Early came to bedside  Interventions:  ABG  7.12/67.8/74.4/21.3 Narcan give Iv, no change in mental status Labs drawn [facility] done,  Pt awoke a little when the board was placed behind his back.  Transferred to [place] He awoke more after we transferred.  He will awake to light touch and voice, then falls back asleep quickly   Plan of Care:  Consult CCM    Event Summary:   MD Notified: Dr Donnetta Hutching came to bedside.  Cardiology at bedside Call Time:  [place] Time: 4497 End Time:  [place]  Raliegh Ip, RN

## 2020-05-02 ENCOUNTER — Inpatient Hospital Stay (HOSPITAL_COMMUNITY): Payer: Medicare HMO

## 2020-05-02 DIAGNOSIS — I714 Abdominal aortic aneurysm, without rupture: Secondary | ICD-10-CM | POA: Diagnosis not present

## 2020-05-02 DIAGNOSIS — I4819 Other persistent atrial fibrillation: Secondary | ICD-10-CM | POA: Diagnosis not present

## 2020-05-02 DIAGNOSIS — E43 Unspecified severe protein-calorie malnutrition: Secondary | ICD-10-CM | POA: Diagnosis not present

## 2020-05-02 DIAGNOSIS — N179 Acute kidney failure, unspecified: Secondary | ICD-10-CM | POA: Diagnosis not present

## 2020-05-02 DIAGNOSIS — G9340 Encephalopathy, unspecified: Secondary | ICD-10-CM

## 2020-05-02 DIAGNOSIS — J9601 Acute respiratory failure with hypoxia: Secondary | ICD-10-CM | POA: Diagnosis not present

## 2020-05-02 LAB — MAGNESIUM: Magnesium: 2.1 mg/dL (ref 1.7–2.4)

## 2020-05-02 LAB — RENAL FUNCTION PANEL
Albumin: 2.1 g/dL — ABNORMAL LOW (ref 3.5–5.0)
Albumin: 2.5 g/dL — ABNORMAL LOW (ref 3.5–5.0)
Anion gap: 11 (ref 5–15)
Anion gap: 10 (ref 5–15)
BUN: 36 mg/dL — ABNORMAL HIGH (ref 8–23)
BUN: 38 mg/dL — ABNORMAL HIGH (ref 8–23)
CO2: 22 mmol/L (ref 22–32)
CO2: 21 mmol/L — ABNORMAL LOW (ref 22–32)
Calcium: 8.1 mg/dL — ABNORMAL LOW (ref 8.9–10.3)
Calcium: 8.3 mg/dL — ABNORMAL LOW (ref 8.9–10.3)
Chloride: 113 mmol/L — ABNORMAL HIGH (ref 98–111)
Chloride: 115 mmol/L — ABNORMAL HIGH (ref 98–111)
Creatinine, Ser: 2.73 mg/dL — ABNORMAL HIGH (ref 0.61–1.24)
Creatinine, Ser: 2.68 mg/dL — ABNORMAL HIGH (ref 0.61–1.24)
GFR, Estimated: 23 mL/min — ABNORMAL LOW (ref >60)
GFR, Estimated: 23 mL/min — ABNORMAL LOW (ref >60)
Glucose, Bld: 128 mg/dL — ABNORMAL HIGH (ref 70–99)
Glucose, Bld: 108 mg/dL — ABNORMAL HIGH (ref 70–99)
Phosphorus: 4.4 mg/dL (ref 2.5–4.6)
Phosphorus: 3.4 mg/dL (ref 2.5–4.6)
Potassium: 3.4 mmol/L — ABNORMAL LOW (ref 3.5–5.1)
Potassium: 3.6 mmol/L (ref 3.5–5.1)
Sodium: 146 mmol/L — ABNORMAL HIGH (ref 135–145)
Sodium: 146 mmol/L — ABNORMAL HIGH (ref 135–145)

## 2020-05-02 LAB — CBC
HCT: 28.8 % — ABNORMAL LOW (ref 39.0–52.0)
Hemoglobin: 9 g/dL — ABNORMAL LOW (ref 13.0–17.0)
MCH: 31.9 pg (ref 26.0–34.0)
MCHC: 31.3 g/dL (ref 30.0–36.0)
MCV: 102.1 fL — ABNORMAL HIGH (ref 80.0–100.0)
Platelets: 397 10*3/uL (ref 150–400)
RBC: 2.82 MIL/uL — ABNORMAL LOW (ref 4.22–5.81)
RDW: 17.1 % — ABNORMAL HIGH (ref 11.5–15.5)
WBC: 7.9 10*3/uL (ref 4.0–10.5)
nRBC: 0.3 % — ABNORMAL HIGH (ref 0.0–0.2)

## 2020-05-02 LAB — GLUCOSE, CAPILLARY
Glucose-Capillary: 106 mg/dL — ABNORMAL HIGH (ref 70–99)
Glucose-Capillary: 109 mg/dL — ABNORMAL HIGH (ref 70–99)
Glucose-Capillary: 110 mg/dL — ABNORMAL HIGH (ref 70–99)
Glucose-Capillary: 110 mg/dL — ABNORMAL HIGH (ref 70–99)
Glucose-Capillary: 114 mg/dL — ABNORMAL HIGH (ref 70–99)
Glucose-Capillary: 122 mg/dL — ABNORMAL HIGH (ref 70–99)
Glucose-Capillary: 137 mg/dL — ABNORMAL HIGH (ref 70–99)

## 2020-05-02 LAB — PROCALCITONIN: Procalcitonin: 0.31 ng/mL

## 2020-05-02 LAB — MRSA PCR SCREENING: MRSA by PCR: NEGATIVE

## 2020-05-02 MED ORDER — POTASSIUM CHLORIDE 20 MEQ PO PACK
40.0000 meq | PACK | Freq: Four times a day (QID) | ORAL | Status: AC
  Administered 2020-05-02 (×2): 40 meq via ORAL
  Filled 2020-05-02 (×2): qty 2

## 2020-05-02 MED ORDER — ACETAMINOPHEN 500 MG PO TABS: 500.0000 mg | ORAL_TABLET | Freq: Four times a day (QID) | ORAL | Status: DC

## 2020-05-02 MED ORDER — LACTULOSE 10 GM/15ML PO SOLN
30.0000 g | Freq: Two times a day (BID) | ORAL | Status: DC
  Administered 2020-05-02 – 2020-05-06 (×5): 30 g
  Filled 2020-05-02 (×5): qty 45

## 2020-05-02 MED ORDER — MAGNESIUM SULFATE 2 GM/50ML IV SOLN
2.0000 g | Freq: Once | INTRAVENOUS | Status: AC
  Administered 2020-05-02: 2 g via INTRAVENOUS
  Filled 2020-05-02: qty 50

## 2020-05-02 MED ORDER — POTASSIUM CHLORIDE 20 MEQ/15ML (10%) PO SOLN
40.0000 meq | ORAL | Status: DC
  Filled 2020-05-02 (×2): qty 30

## 2020-05-02 MED ORDER — MELATONIN 3 MG PO TABS
3.0000 mg | ORAL_TABLET | Freq: Every day | ORAL | Status: DC
  Administered 2020-05-02: 3 mg
  Filled 2020-05-02: qty 1

## 2020-05-02 MED ORDER — LACTULOSE 10 GM/15ML PO SOLN: 30.0000 g | Freq: Three times a day (TID) | ORAL | Status: DC

## 2020-05-02 MED ORDER — ALBUMIN HUMAN 25 % IV SOLN
25.0000 g | Freq: Two times a day (BID) | INTRAVENOUS | Status: DC
  Administered 2020-05-02 (×2): 25 g via INTRAVENOUS
  Filled 2020-05-02 (×2): qty 100

## 2020-05-02 MED ORDER — FREE WATER
200.0000 mL | Status: DC
  Administered 2020-05-02 – 2020-05-03 (×6): 200 mL

## 2020-05-02 NOTE — Progress Notes (Signed)
eLink Physician-Brief Progress Note Patient Name: Carlos Olson DOB: [birth year] MRN: [id number]   Date of Service  05/02/2020  HPI/Events of Note  Hypomagnesemia - Mg++ = 1.6 and Creatinine = 2.51.   eICU Interventions  Will replace Mg++.      Intervention Category Major Interventions: Electrolyte abnormality - evaluation and management  Carolynn Tuley [place] 05/02/2020, 12:03 AM

## 2020-05-02 NOTE — Plan of Care (Signed)
  Problem: Education: Goal: Knowledge of General Education information will improve Description: Including pain rating scale, medication(s)/side effects and non-pharmacologic comfort measures Outcome: Progressing   Problem: Health Behavior/Discharge Planning: Goal: Ability to manage health-related needs will improve Outcome: Progressing   Problem: Clinical Measurements: Goal: Ability to maintain clinical measurements within normal limits will improve Outcome: Progressing Goal: Will remain free from infection Outcome: Progressing Goal: Diagnostic test results will improve Outcome: Progressing Goal: Respiratory complications will improve Outcome: Progressing Goal: Cardiovascular complication will be avoided Outcome: Progressing   Problem: Activity: Goal: Risk for activity intolerance will decrease Outcome: Progressing   Problem: Nutrition: Goal: Adequate nutrition will be maintained Outcome: Progressing   Problem: Coping: Goal: Level of anxiety will decrease Outcome: Progressing   Problem: Elimination: Goal: Will not experience complications related to bowel motility Outcome: Progressing Goal: Will not experience complications related to urinary retention Outcome: Progressing   Problem: Pain Managment: Goal: General experience of comfort will improve Outcome: Progressing   Problem: Safety: Goal: Ability to remain free from injury will improve Outcome: Progressing   Problem: Skin Integrity: Goal: Risk for impaired skin integrity will decrease Outcome: Progressing   Problem: Education: Goal: Knowledge of the prescribed therapeutic regimen will improve Outcome: Progressing   Problem: Bowel/Gastric: Goal: Gastrointestinal status for postoperative course will improve Outcome: Progressing   Problem: Cardiac: Goal: Ability to maintain an adequate cardiac output will improve Outcome: Progressing   Problem: Clinical Measurements: Goal: Postoperative complications  will be avoided or minimized Outcome: Progressing   Problem: Respiratory: Goal: Respiratory status will improve Outcome: Progressing   Problem: Skin Integrity: Goal: Demonstration of wound healing without infection will improve Outcome: Progressing   Problem: Urinary Elimination: Goal: Ability to achieve and maintain adequate renal perfusion and functioning will improve Outcome: Progressing   

## 2020-05-02 NOTE — Procedures (Signed)
Cortrak  Person Inserting Tube:  Esaw Dace, RD Tube Type:  Cortrak - 43 inches Tube Location:  Left nare Initial Placement:  Stomach Secured by: Bridle Technique Used to Measure Tube Placement:  Documented cm marking at nare/ corner of mouth Cortrak Secured At:  87 cm    Cortrak Tube Team Note:  Consult received to place a Cortrak feeding tube.   No x-ray is required. RN may begin using tube.   If the tube becomes dislodged please keep the tube and contact the Cortrak team at [email or web address] (password [email or web address]) for replacement.  If after hours and replacement cannot be delayed, place a NG tube and confirm placement with an abdominal x-ray.    Kerman Passey MS, RDN, LDN, CNSC Registered Dietitian III Clinical Nutrition RD Pager and On-Call Pager Number Located in [place]

## 2020-05-02 NOTE — Plan of Care (Signed)
°  Problem: Education: Goal: Knowledge of General Education information will improve Description: Including pain rating scale, medication(s)/side effects and non-pharmacologic comfort measures Outcome: Progressing   Problem: Health Behavior/Discharge Planning: Goal: Ability to manage health-related needs will improve Outcome: Progressing   Problem: Clinical Measurements: Goal: Ability to maintain clinical measurements within normal limits will improve Outcome: Progressing Goal: Will remain free from infection Outcome: Progressing Goal: Diagnostic test results will improve Outcome: Progressing Goal: Respiratory complications will improve Outcome: Progressing Goal: Cardiovascular complication will be avoided Outcome: Progressing   Problem: Activity: Goal: Risk for activity intolerance will decrease Outcome: Progressing   Problem: Nutrition: Goal: Adequate nutrition will be maintained Outcome: Progressing   Problem: Coping: Goal: Level of anxiety will decrease Outcome: Progressing   Problem: Elimination: Goal: Will not experience complications related to bowel motility Outcome: Progressing Goal: Will not experience complications related to urinary retention Outcome: Progressing   Problem: Pain Managment: Goal: General experience of comfort will improve Outcome: Progressing   Problem: Safety: Goal: Ability to remain free from injury will improve Outcome: Progressing   Problem: Skin Integrity: Goal: Risk for impaired skin integrity will decrease Outcome: Progressing   Problem: Education: Goal: Knowledge of the prescribed therapeutic regimen will improve Outcome: Progressing   Problem: Bowel/Gastric: Goal: Gastrointestinal status for postoperative course will improve Outcome: Progressing   Problem: Cardiac: Goal: Ability to maintain an adequate cardiac output will improve Outcome: Progressing   Problem: Clinical Measurements: Goal: Postoperative complications  will be avoided or minimized Outcome: Progressing   Problem: Respiratory: Goal: Respiratory status will improve Outcome: Progressing   Problem: Skin Integrity: Goal: Demonstration of wound healing without infection will improve Outcome: Progressing   Problem: Urinary Elimination: Goal: Ability to achieve and maintain adequate renal perfusion and functioning will improve Outcome: Progressing   

## 2020-05-02 NOTE — Progress Notes (Addendum)
AAA Progress Note    05/02/2020 7:48 AM 6 Days Post-Op  Subjective:  Sleeping but wakes easily.  More alert today.  Wants water to drink.  Daughter feels swelling is a little better today.   Afebrile x 24hrs HR 100's >> 60's since 2am 41'Y-606'T systolic 01% [id number]  Gtts:   Amiodarone (changed from po to IV last evening after HR 130's)  Vitals:   05/02/20 0600 05/02/20 0700  BP: 120/62 (!) 109/57  Pulse: 69 61  Resp: 19 17  Temp:    SpO2: 95% 100%    Physical Exam: Cardiac:  regular Lungs:  Non labored on 4LO2NC Abdomen:  Soft, NT/ND; +BM Incisions:  All incisions look good.  Extremities:  Unable to palpate bilateral pedal pulses. General:  No distress-wakes easily.   Neuro:  Alert to year and president.   CBC    Component Value Date/Time   WBC 7.9 05/02/2020 0601   RBC 2.82 (L) 05/02/2020 0601   HGB 9.0 (L) 05/02/2020 0601   HGB 12.1 (L) 08/20/2012 0610   HCT 28.8 (L) 05/02/2020 0601   HCT 44.0 08/02/2012 0853   PLT 397 05/02/2020 0601   PLT 158 08/20/2012 0610   MCV 102.1 (H) 05/02/2020 0601   MCV 99 08/02/2012 0853   MCH 31.9 05/02/2020 0601   MCHC 31.3 05/02/2020 0601   RDW 17.1 (H) 05/02/2020 0601   RDW 13.7 08/02/2012 0853   LYMPHSABS 0.4 (L) 05/01/2020 0914   MONOABS 0.6 05/01/2020 0914   EOSABS 0.0 05/01/2020 0914   BASOSABS 0.0 05/01/2020 0914    BMET    Component Value Date/Time   NA 146 (H) 05/02/2020 0601   NA 136 08/20/2012 0610   K 3.4 (L) 05/02/2020 0601   K 4.2 08/20/2012 0610   CL 113 (H) 05/02/2020 0601   CL 102 08/20/2012 0610   CO2 22 05/02/2020 0601   CO2 26 08/20/2012 0610   GLUCOSE 128 (H) 05/02/2020 0601   GLUCOSE 123 (H) 08/20/2012 0610   BUN 36 (H) 05/02/2020 0601   BUN 7 08/20/2012 0610   CREATININE 2.73 (H) 05/02/2020 0601   CREATININE 0.75 08/20/2012 0610   CALCIUM 8.1 (L) 05/02/2020 0601   CALCIUM 8.4 (L) 08/20/2012 0610   GFRNONAA 23 (L) 05/02/2020 0601   GFRNONAA >60 08/20/2012 0610   GFRAA >60 05/07/2017  0949   GFRAA >60 08/20/2012 0610    INR    Component Value Date/Time   INR 1.5 (H) 04/26/2020 1020   INR 1.0 08/02/2012 0853     Intake/Output Summary (Last 24 hours) at 05/02/2020 0748 Last data filed at 05/02/2020 0600 Gross per 24 hour  Intake 2459.05 ml  Output 3145 ml  Net -685.95 ml     Assessment/Plan:  79 y.o. 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  BLE well perfused.   -Cardiac:  Converted from afib to NSR earlier with IV amiodarone.  HR much improved.  BLE pitting edema. -Pulmonary:  4LO2NC; continue IS and pulmonary toilet and mobilize pt.  -Neuro:  Awake and alert to President and year-improved from when I saw him on Monday. -Renal:  Creatinine slightly elevated from yesterday; received lasix yesterday per renal.  Hypernatremia resolved -GI:  Abdomen is soft; +BM; severe malnutrition - TF started yesterday.  For Coretrak today. -Incisions:  All incisions look good and healing nicely. -Heme/ID:  Acute blood loss anemia improved from yesterday at 9 from 8.5.  Leukocytosis resolved at 7.9 from 14 yesterday. Vanc/Zosyn for PNA. -General:  Pt looks better today.  Resting comfortably and awakes easily.  More alert today.  No distress.    Leontine Locket, PA-C [facility] [phone] 05/02/2020 7:48 AM  I have independently interviewed and examined patient and agree with PA assessment and plan above.  He is mentally clear today although somewhat confused.  Tube feeds have been started.  He appears to be healing nicely from his aortobifemoral bypass with the above-noted issues.  Appreciate all consulting specialist care of this patient.  Alonzo Loving C. Donzetta Matters,  MD [facility] [place] Office: [phone] Pager: [phone]

## 2020-05-02 NOTE — Progress Notes (Signed)
Progress Note  Patient Name: Carlos Olson Date of Encounter: 05/02/2020  Primary Cardiologist: Werner Lean, MD   Subjective   79 M with Coronary Artery Calcifications, Thoracic Aortic Aneurysm and baseline creatinine 1.19 who presented for AAA 04/17/20 with prolonged course:  Called 04/18/20 for hypotension, through course developed A fib RVR s/p 04/26/20 DCCV to NSR with quick return to atrial fibrillation, ICU Delirium, Aspiration PNA, and AKI.  Uptriaged 05/03/20 for hypercapnia, AMS, and hypotension.    Overnight, Procalcitonin only 0.31.  Increase in Creatinine.  Patient returned to RVR and was started on amiodarone IV.  With this, rate has much improved.  Blood pressure has improved as well.  Tolerate metoprolol 62.5 mg PM dose without hypotension.  More communicative this AM.  Inpatient Medications    Scheduled Meds:  acetaminophen  650 mg Oral Q6H   Or   acetaminophen  325-650 mg Rectal Q6H   apixaban  2.5 mg Per Tube BID   atorvastatin  10 mg Oral QHS   chlorhexidine  15 mL Mouth Rinse BID   Chlorhexidine Gluconate Cloth  6 each Topical Daily   darbepoetin (ARANESP) injection - NON-DIALYSIS  100 mcg Subcutaneous Q Mon-1800   feeding supplement (PROSource TF)  45 mL Per Tube BID   fluticasone furoate-vilanterol  1 puff Inhalation Daily   lactulose  30 g Per Tube TID   lidocaine  2 patch Transdermal Q24H   mouth rinse  15 mL Mouth Rinse q12n4p   metoprolol tartrate  62.5 mg Oral BID   pantoprazole sodium  40 mg Per Tube Daily   sodium bicarbonate  1,300 mg Oral BID   umeclidinium bromide  1 puff Inhalation Daily   Continuous Infusions:  sodium chloride 250 mL (04/23/20 0343)   amiodarone 30 mg/hr (05/02/20 0437)   feeding supplement (VITAL 1.5 CAL) 30 mL/hr at 05/02/20 0100   fluconazole (DIFLUCAN) IV Stopped (05/01/20 1117)   piperacillin-tazobactam (ZOSYN)  IV Stopped (05/02/20 0531)   vancomycin Stopped (05/01/20 2357)   PRN Meds: alum & mag  hydroxide-simeth, bisacodyl, Gerhardt's butt cream, naLOXone (NARCAN)  injection, ondansetron, oxymetazoline   Vital Signs    Vitals:   05/02/20 0440 05/02/20 0500 05/02/20 0600 05/02/20 0700  BP:  120/61 120/62 (!) 109/57  Pulse:  68 69 61  Resp:  15 19 17   Temp: 98.9 F (37.2 C)     TempSrc: Axillary     SpO2:  98% 95% 100%  Weight:  101 kg    Height:        Intake/Output Summary (Last 24 hours) at 05/02/2020 0804 Last data filed at 05/02/2020 0600 Gross per 24 hour  Intake 2459.05 ml  Output 3145 ml  Net -685.95 ml   Filed Weights   04/26/20 1104 05/02/20 0500  Weight: 94.7 kg 101 kg    Telemetry    Conversion to sinus rhythm rate at 245 AM - Personally Reviewed  ECG    No new - Personally Reviewed  Physical Exam   GEN: No acute distress, somnolent but much more rousable Neck: No JVD near supine Cardiac: RRR, no murmurs, rubs, or gallops.  Respiratory: Coarse breath sounds bilaterally GI: Soft, nontender, non-distended  MS: +2 edema, warm, R femoral slight hardening Neuro:  Nonfocal  Psych: Normal affect   Labs    Chemistry Recent Labs  Lab 05/01/20 0914 05/01/20 1445 05/01/20 1609 05/01/20 1747 05/02/20 0601  NA 140   < > 143 145 146*  K 3.4*   < >  4.0 3.5 3.4*  CL 110  --  113*  --  113*  CO2 18*  --  22  --  22  GLUCOSE 186*  --  112*  --  128*  BUN 35*  --  35*  --  36*  CREATININE 2.50*  --  2.51*  --  2.73*  CALCIUM 7.9*  --  7.8*  --  8.1*  PROT 5.2*  --   --   --   --   ALBUMIN 1.7*  --  1.8*  --  2.1*  AST 21  --   --   --   --   ALT 18  --   --   --   --   ALKPHOS 96  --   --   --   --   BILITOT 0.5  --   --   --   --   GFRNONAA 25*  --  25*  --  23*  ANIONGAP 12  --  8  --  11   < > = values in this interval not displayed.    Hematology Recent Labs  Lab 05/01/20 0720 05/01/20 0720 05/01/20 0914 05/01/20 0914 05/01/20 1445 05/01/20 1747 05/02/20 0601  WBC 13.6*  --  14.0*  --   --   --  7.9  RBC 2.99*  --  3.03*  --    --   --  2.82*  HGB 9.4*   < > 9.5*   < > 11.2* 8.5* 9.0*  HCT 30.9*   < > 31.1*   < > 33.0* 25.0* 28.8*  MCV 103.3*  --  102.6*  --   --   --  102.1*  MCH 31.4  --  31.4  --   --   --  31.9  MCHC 30.4  --  30.5  --   --   --  31.3  RDW 17.4*  --  17.2*  --   --   --  17.1*  PLT 458*  --  416*  --   --   --  397   < > = values in this interval not displayed.   Cardiac EnzymesNo results for input(s): TROPONINI in the last 168 hours. No results for input(s): TROPIPOC in the last 168 hours.   BNP Recent Labs  Lab 05/01/20 0914  BNP 461.1*     DDimer No results for input(s): DDIMER in the last 168 hours.   Radiology    DG CHEST PORT 1 VIEW  Result Date: 05/01/2020 CLINICAL DATA:  Respiratory distress. EXAM: PORTABLE CHEST 1 VIEW COMPARISON:  04/27/2020. FINDINGS: Heart size stable. Diffuse bilateral scratched it diffuse severe bilateral pulmonary infiltrates/edema again noted without interim change. Bilateral pleural effusions. No pneumothorax. No acute bony abnormality. IMPRESSION: Diffuse severe bilateral pulmonary infiltrates/edema again noted without interim change. Bilateral pleural effusions. Electronically Signed   By: Marcello Moores  Register   On: 05/01/2020 09:29   DG Abd Portable 1V  Result Date: 05/01/2020 CLINICAL DATA:  NG tube placement. EXAM: PORTABLE ABDOMEN - 1 VIEW COMPARISON:  04/21/2020 CT abdomen pelvis. 04/17/2020 abdominal radiographs. FINDINGS: Non weighted enteric tube tip and side hole overlie the gastric body. Residual enteric contrast opacifies nondilated large bowel. Bibasilar opacities are better evaluated on concurrent chest radiograph. IMPRESSION: Enteric tube tip and side hole overlie the gastric body. Electronically Signed   By: Primitivo Gauze M.D.   On: 05/01/2020 11:36   VAS US RENAL ARTERY DUPLEX  Result Date: 04/30/2020 ABDOMINAL  VISCERAL Indications: AKI. S/p aorta bifemoral bypass graft 04/17/2020 High Risk Factors: Past history of smoking, coronary  artery disease. Other Factors: AAA. Limitations: Air/bowel gas, obesity, shortness of breath, patient position, midline incision, and restricted mobility. Comparison Study: No prior study Performing Technologist: Maudry Mayhew MHA, RDMS, RVT, RDCS  Examination Guidelines: A complete evaluation includes B-mode imaging, spectral Doppler, color Doppler, and power Doppler as needed of all accessible portions of each vessel. Bilateral testing is considered an integral part of a complete examination. Limited examinations for reoccurring indications may be performed as noted.  Duplex Findings: +----------------------+--------+--------+------+------------------+ Mesenteric            PSV cm/sEDV cm/sPlaque     Comments      +----------------------+--------+--------+------+------------------+ Aorta Prox                                  Unable to insonate +----------------------+--------+--------+------+------------------+ Celiac Artery Proximal                      Unable to insonate +----------------------+--------+--------+------+------------------+ SMA Proximal                                Unable to insonate +----------------------+--------+--------+------+------------------+    +------------------+--------+--------+------------------+ Right Renal ArteryPSV cm/sEDV cm/s     Comment       +------------------+--------+--------+------------------+ Origin                            Unable to insonate +------------------+--------+--------+------------------+ Proximal             68      16                      +------------------+--------+--------+------------------+ Mid                  59      13                      +------------------+--------+--------+------------------+ Distal               72      15                      +------------------+--------+--------+------------------+ +-----------------+--------+--------+------------------+ Left Renal ArteryPSV cm/sEDV  cm/s     Comment       +-----------------+--------+--------+------------------+ Origin                           Unable to insonate +-----------------+--------+--------+------------------+ Proximal                         Unable to insonate +-----------------+--------+--------+------------------+ Mid                              Unable to insonate +-----------------+--------+--------+------------------+ Distal                           Unable to insonate +-----------------+--------+--------+------------------+ +------------+--------+-------+----+-----------+-----------------+--------+----+ Right KidneyPSV cm/sEDV    RI  Left KidneyPSV cm/s         EDV cm/sRI  cm/s                                                +------------+--------+-------+----+-----------+-----------------+--------+----+ Upper Pole  25      6      0.76Upper Pole 17               8       0.50 +------------+--------+-------+----+-----------+-----------------+--------+----+ Mid         28      7      0.76Mid        20               4       0.79 +------------+--------+-------+----+-----------+-----------------+--------+----+ Lower Pole  18      7      0.60Lower Pole 18               3       0.83 +------------+--------+-------+----+-----------+-----------------+--------+----+ Hilar       20      7      0.67Hilar      Unable to                                                               insonate                      +------------+--------+-------+----+-----------+-----------------+--------+----+ +------------------+-------------------+------------------+--------------------+ Right Kidney                         Left Kidney                            +------------------+-------------------+------------------+--------------------+ RAR                                  RAR                                     +------------------+-------------------+------------------+--------------------+ RAR (manual)      Unable to calculateRAR (manual)      Unable to calculate                    due to difficulty                    due to difficulty                      insonating aorta                     insonating LRA and                                                          aorta                +------------------+-------------------+------------------+--------------------+  Cortex            13/4 cm/s          Cortex            15/5 cm/s            +------------------+-------------------+------------------+--------------------+ Cortex thickness                     Corex thickness                        +------------------+-------------------+------------------+--------------------+ Kidney length (cm)11.10              Kidney length (cm)11.80                +------------------+-------------------+------------------+--------------------+  Summary: Renal:  Right: No evidence of right renal artery stenosis. RRV flow present. Left:  Limited evaluation of the left kidney secondary to multiple        technical limitations listed above. Perfusion noted within        left kidney with mildly elevated resistive indices.  *See table(s) above for measurements and observations.  Diagnosing physician: Ruta Hinds MD  Electronically signed by Ruta Hinds MD on 04/30/2020 at 7:10:43 PM.    Final    VAS [place] UPPER EXTREMITY VENOUS DUPLEX  Result Date: 04/30/2020 UPPER VENOUS STUDY  Indications: Edema Limitations: Patient position, restricted mobility, bandaging. Comparison Study: No prior study Performing Technologist: Maudry Mayhew MHA, RDMS, RVT, RDCS  Examination Guidelines: A complete evaluation includes B-mode imaging, spectral Doppler, color Doppler, and power Doppler as needed of all accessible portions of each vessel. Bilateral testing is considered an integral part of a complete examination.  Limited examinations for reoccurring indications may be performed as noted.  Right Findings: +----------+------------+---------+-----------+----------+-------+ RIGHT     CompressiblePhasicitySpontaneousPropertiesSummary +----------+------------+---------+-----------+----------+-------+ Subclavian               Yes       Yes                      +----------+------------+---------+-----------+----------+-------+  Left Findings: +----------+------------+---------+-----------+----------+-------+ LEFT      CompressiblePhasicitySpontaneousPropertiesSummary +----------+------------+---------+-----------+----------+-------+ IJV           Full       Yes       Yes                      +----------+------------+---------+-----------+----------+-------+ Subclavian    Full       Yes       Yes                      +----------+------------+---------+-----------+----------+-------+ Axillary      Full       Yes       Yes                      +----------+------------+---------+-----------+----------+-------+ Brachial      Full       Yes       Yes                      +----------+------------+---------+-----------+----------+-------+ Radial        Full                                          +----------+------------+---------+-----------+----------+-------+ Ulnar  Full                                          +----------+------------+---------+-----------+----------+-------+ Cephalic      Full                                          +----------+------------+---------+-----------+----------+-------+ Basilic       Full                                          +----------+------------+---------+-----------+----------+-------+  Summary:  Right: No evidence of thrombosis in the subclavian.  Left: No evidence of deep vein thrombosis in the upper extremity. No evidence of superficial vein thrombosis in the upper extremity.  *See table(s) above for  measurements and observations.  Diagnosing physician: Ruta Hinds MD Electronically signed by Ruta Hinds MD on 04/30/2020 at 7:10:29 PM.    Final     Cardiac Studies   None new  Patient Profile     79 y.o. male s/p AAA repair with complicated postoperative course with AKI, volume overload, and post operative AF  Assessment & Plan    Post operative Afib with RVR HTN Blood loss anemia Thoracic and Aortic Aneurysm - s/p DCCV 04/26/20 with conversion to NSR, but back to Afib RVR shortly thereafter - echo with EF 50% with moderate LAE and mild RAE - Will continue a day of amiodarone IV with possible PO transition 05/03/20 - continue eliquis 2.5 mg BID --> will need uninterrupted AC for 4 weeks s/p DCCV - with no interruptions in his [facility] so far post DCCV; may consider peri-discharge DCCV in the setting of his amiodarone load - lopressor 62.5 mg BID with controlled rates   AKI Aspiration PNA  Hypoxic and hypercapneic respiratory failure Malnutrition with albumin < 2 - per nephrology and primary; pharmacy assisting with dosing based on kidney - Slightly net negative; would continue IV diuresis (albumin + 80 IV BID lasix as discussed by nephrology reasonable; can increase lasix goal with net negative goal -1 L) if nephrology in agreement  For questions or updates, please contact [facility] Please consult [email or web address] for contact info under Cardiology/STEMI.      Signed, Werner Lean, MD  05/02/2020, 8:04 AM

## 2020-05-02 NOTE — Progress Notes (Signed)
Physical Therapy Treatment Patient Details Name: Carlos Olson MRN: [id number] DOB: 06-02-1941 Today's Date: 05/02/2020    History of Present Illness Pt admit with abdominal aortic aneurysm, bilateral common iliac artery aneurysms, right hypogastric artery aneurysm, right common femoral artery aneurysm.  Underwent aortobifemoral BPG.  Multiple complications since admission.     PT Comments    Pt admitted with above diagnosis. Pt was able to pivot to chair with HR under better control. Still requires +2 mod assist for transfers. Pt tolerated treatment well overall today with fatigue but was more conversive today. Pt met 0/4 goals set due to medical complications therefore revised goals today.   Pt currently with functional limitations due to balance and endurance deficits. Pt will benefit from skilled PT to increase their independence and safety with mobility to allow discharge to the venue listed below.     Follow Up Recommendations  Supervision/Assistance - 24 hour;CIR     Equipment Recommendations  3in1 (PT)    Recommendations for Other Services       Precautions / Restrictions Precautions Precautions: Fall Precaution Comments: 02 Restrictions Weight Bearing Restrictions: No    Mobility  Bed Mobility Overal bed mobility: Needs Assistance Bed Mobility: Supine to Sit Rolling: Mod assist Sidelying to sit: +2 for physical assistance;Mod assist Supine to sit: Mod assist;+2 for physical assistance     General bed mobility comments: Pt needed mod assist for bed mobility wit incr time to complete tasks.   Transfers Overall transfer level: Needs assistance Equipment used:  [place] walker) Transfers: Sit to/from [employer] to Stand: Mod assist;+2 physical assistance;Max assist;From elevated surface Stand pivot transfers: +2 safety/equipment;Min assist;From elevated surface;Mod assist       General transfer comment:  Pt allowed to pull up on Eva walker with  one UE, assist to steady. Pt able to pivot a few steps and to recliner with assist to control descent.  Pt needed incr cues to step around to chair and took incr time.  Nurse cleaned pt once up as he did have some BM and urine on him as he had some leaks.  Pt did not want to incr ambulation today.  Bed to chair only.   Ambulation/Gait                 Stairs             Wheelchair Mobility    Modified Rankin (Stroke Patients Only)       Balance Overall balance assessment: Needs assistance Sitting-balance support: No upper extremity supported;Feet supported Sitting balance-Leahy Scale: Fair Sitting balance - Comments: can sit EOB without support   Standing balance support: Bilateral upper extremity supported Standing balance-Leahy Scale: Poor Standing balance comment: reliant on Eva walker as well as need for external support                            Cognition Arousal/Alertness: Awake/alert Behavior During Therapy: Flat affect Overall Cognitive Status: Impaired/Different from baseline Area of Impairment: Following commands;Memory;Attention;Problem solving                   Current Attention Level: Sustained Memory: Decreased short-term memory Following Commands: Follows one step commands with increased time     Problem Solving: Slow processing;Decreased initiation;Difficulty sequencing;Requires verbal cues General Comments: pt joking, sarcastic      Exercises General Exercises - Lower Extremity Ankle Circles/Pumps: AROM;Both;10 reps;Seated Long Arc Quad: AROM;Both;10 reps;Seated  General Comments General comments (skin integrity, edema, etc.): HR up to 89bpm with activity.  Sats >90% on 4LO2.       Pertinent Vitals/Pain Pain Assessment: Faces Faces Pain Scale: Hurts even more Pain Location: groin bilaterally, abdomen Pain Descriptors / Indicators: Aching;Grimacing;Guarding Pain Intervention(s): Limited activity within patient's  tolerance;Monitored during session;Repositioned    Home Living                      Prior Function            PT Goals (current goals can now be found in the care plan section) Acute Rehab PT Goals Patient Stated Goal: to go home PT Goal Formulation: With patient Time For Goal Achievement: 05/16/20 Potential to Achieve Goals: Good Progress towards PT goals: Goals downgraded-see care plan;Not progressing toward goals - comment (pt with multiple medical issues. )    Frequency    Min 3X/week      PT Plan Current plan remains appropriate    Co-evaluation              AM-PAC PT "6 Clicks" Mobility   Outcome Measure  Help needed turning from your back to your side while in a flat bed without using bedrails?: A Lot Help needed moving from lying on your back to sitting on the side of a flat bed without using bedrails?: A Lot Help needed moving to and from a bed to a chair (including a wheelchair)?: A Lot Help needed standing up from a chair using your arms (e.g., wheelchair or bedside chair)?: A Lot Help needed to walk in hospital room?: A Lot Help needed climbing 3-5 steps with a railing? : Total 6 Click Score: 11    End of Session Equipment Utilized During Treatment: Gait belt;Oxygen Activity Tolerance: Patient limited by fatigue Patient left: with call bell/phone within reach;in chair;with chair alarm set;with family/visitor present Nurse Communication: Mobility status PT Visit Diagnosis: Muscle weakness (generalized) (M62.81);Pain Pain - part of body:  (back and groin)     Time: 0761-5183 PT Time Calculation (min) (ACUTE ONLY): 38 min  Charges:  $Gait Training: 8-22 mins $Therapeutic Exercise: 8-22 mins $Therapeutic Activity: 8-22 mins                     Carlos Olson,PT Acute Rehabilitation Services Pager:  [phone]  Office:  [place] 05/02/2020, 1:51 PM

## 2020-05-02 NOTE — Progress Notes (Signed)
  Speech Language Pathology Treatment: Dysphagia  Patient Details Name: Carlos Olson MRN: [id number] DOB: [birth year] Today's Date: 05/02/2020 Time: 4174-0814 SLP Time Calculation (min) (ACUTE ONLY): 28 min  Assessment / Plan / Recommendation Clinical Impression  Pt was seen for dysphagia treatment. He was alert and cooperative throughout the session. Pt is now NPO and Cortrak is in place. RN contacted Dr. Carlis Abbott and she advised that the pt may start a p.o. diet based on SLP's recommendations. Pt demonstrated intermittent confusion during the session. Dentures were in place. He tolerated puree solids and dysphagia 3 solids without overt s/sx of aspiration. He exhibited inconsistent coughing with regular texture solids, thin liquids, and nectar thick liquids, suggesting aspiration. Baseline coughing was not observed by this SLP at the onset of the session or following completion while SLP was documenting outside pt's room. However, Carlos Brawn, RN indicated that the pt has been coughing relatively frequently today so the possibility of coughing with p.o. being unrelated to aspiration is considered. Pt may have ice chips and thin water following oral care. SLP will follow to assess improvement in swallow function. However, a repeat modified barium swallow study may be warranted if his symptoms persist.    HPI HPI: Carlos Olson is a 79 y.o. 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 CXR 04/23/20 revealed lower lobe opacification with trace pleural fluid. CXR 10/28: Persistent BILATERAL pulmonary infiltrates consistent with multifocal pneumonia, slightly greater on RIGHT. CXR 11/2 Diffuse severe bilateral pulmonary infiltrates/edema again noted without interim change. Bilateral pleural effusions. Pt became obtunded on 11/2 and was transferred back to ICU and Cortrak placed on 11/3.      SLP Plan   Continue with current plan of care       Recommendations  Diet recommendations: NPO;Thin liquid (May have ice chips and water following oral care) Liquids provided via: Cup;Straw Medication Administration: Via alternative means                Oral Care Recommendations: Oral care BID;Oral care prior to ice chip/H20 Follow up Recommendations: Other (comment) (TBD) SLP Visit Diagnosis: Dysphagia, unspecified (R13.10) Plan: Continue with current plan of care       Carlos Olson I. Carlos Olson, [place], [place] Office number [phone] Pager [place] 05/02/2020, 3:55 PM

## 2020-05-02 NOTE — Progress Notes (Signed)
NAME:  Carlos Olson, MRN:  [id number], DOB:  [birth year], LOS: 72 ADMISSION DATE:  04/17/2020, CONSULTATION DATE: 10/21 REFERRING MD: Dr. Donzetta Matters, CHIEF COMPLAINT: AKI with mild hypoxia  Brief History   79 year old male who presented 10/18 for elective endovascular repair abdominal aortic aneurysm with Dr. Donzetta Matters. PCCM consulted 10/21 for assistance in medical management.  Hospital course complicated by AKI, metabolic encephalopathy, hypoxia/ RLL pneumonia, and hypotension. Treated in ICU for sepsis, AFRVR, underwent cardioversion, ABX course. Moved out of ICU. Ongoing course significant for AKI, AF, debility. Then 11/2 he became encephalopathic overnight and was transferred back to ICU. PCCM re-consulted.   Past Medical History  AAA History of prostate cancer Heart murmur Diverticulitis COPD CAD atherosclerosis  Significant Hospital Events   Admitted 10/18 for elective AAA repair 10/19 OR   Consults:  Cardiology PCCM Nephrology GI  Procedures:  Endovascular AAA repair 10/19  Significant Diagnostic Tests:  04/21/2020 CT A/P >> Status post abdominal aortic repair with aortobifemoral bypass graft. Some stranding is noted in the operative site likely related to postoperative change. No sizable hematoma is seen. Diverticulosis without diverticulitis. Near complete consolidation of the right lower lobe. Small left pleural effusion is seen with left basilar atelectasis.  10/25 CTH >> 1. Stable.  No acute intracranial abnormality. 2. Atrophy with chronic small vessel white matter ischemic disease.  Micro Data:  Blood 10/28 > negative Sputum 10/28 > few candida  Antimicrobials:  10/19 cefazolin 10/22 zosyn > 10/26 Zosyn 10/29 >>> Fluconazole 10/29 >>>  Interim history/subjective:  Much more responsive, denies pain.     Objective   Blood pressure (!) 110/59, pulse 66, temperature 98.1 F (36.7 C), temperature source Oral, resp. rate (!) 27, height 6\' 3"  (1.905 m), weight 101  kg, SpO2 100 %.        Intake/Output Summary (Last 24 hours) at 05/02/2020 1005 Last data filed at 05/02/2020 0800 Gross per 24 hour  Intake 1639.24 ml  Output 3145 ml  Net -1505.76 ml   Filed Weights   04/26/20 1104 05/02/20 0500  Weight: 94.7 kg 101 kg   Examination: General: Elderly man lying in bed no acute distress HEENT: [place]/AT, eyes anicteric. Neuro: Awake and alert, answering questions appropriately but occasionally saying nonsensical things.  Some memory impairment.  Moving all extremities spontaneously but globally weak. CV: Regular rate and rhythm, no murmurs PULM: Rhonchi bilaterally, weak wet sounding cough. GI: Soft, nontender, nondistended.  Midline incision. Extremities: Pitting ankle and foot edema.  Resolved Hospital Problem list     Assessment & Plan:   Acute hypoxic respiratory failure-likely resolving pneumonia.  Acute hypercapnia now resolved, was likely driven by metabolic/toxic encephalopathy. Ammonia elevated, recent dilaudid administration in the setting of renal failure with an expected response to Narcan. -Continue supplemental oxygen as required to maintain SPO2 greater than 90.  Titrate down as able. -Chest PT, frequent NT suctioning. -Sputum culture pending.  Likely previous Candida was a contaminant.  Would like to de-escalate vancomycin to Zosyn monotherapy.  Will swab nares for MRSA to determine if vancomycin should be able to be safely discontinued. -Requires ongoing ICU care for frequent respiratory interventions  Acute metabolic encephalopathy: due to dilaudid & hyperammoniemia  -Continue lactulose twice daily -Frequent reorientation, mobility -Will add melatonin nightly. -His daughter has asked repeatedly that opiates not be administered given his severe encephalopathy. Tylenol for pain.  AKI: likely secondary to ATN in the setting of waxing and waning hemodynamics. Hypokalemia due to diuresis Hypernatremia -Appreciate nephrology's  recommendations. -Continue  ongoing diuresis -Would like to discontinue vancomycin if able -Strict I/O -Renally dose meds and avoid nephrotoxic meds -Continue to monitor -Cautious potassium repletion -Start free water enterally, con't to monitor sodium  Atrial fibrillation, new onset.  Currently rate controlled. S/p DCCV 10/28 with successful conversion, but now back in AF.  -Appreciate cardiology's recommendations.  Continue Eliquis and amiodarone.  RLL pneumona.  Culture before antibiotics started unfortunately unrevealing. His clinical course does not seem consistent with the small amount of candidate in that culture. -Continue Vanco and Zosyn.  Collecting MRSA nares swab in an effort to de-escalate vancomycin. -Would like to discontinue fluconazole -Continue to follow respiratory culture -Nasotracheal suctioning as needed and CPT every 4 hours while awake. -Continue flutter and I-S.  Out of bed mobility.  History of COPD; FEV1 65% of predicted Former smoker History of pulmonary nodules Primary pulmonologist is Dr. Lamonte Sakai, last seen in pulmonary clinic 11/25/2019 -Continue Breo and Incruse daily -Bronchodilators PRN  Combined systolic and diastolic congestive heart failure -Patient underwent preop echocardiogram that revealed EF of 50% with global hypokinesis of left ventricle and grade 1 diastolic dysfunction History of CAD -Continue diuresis.  Abdominal and common iliac artery aneurysm and right common femoral artery aneurysm repair P: -Postop management per vascular surgery.  Acute on chronic anemia due to acute blood loss P: -Transfuse for hemoglobin less than 7 or hemodynamically significant bleeding.   -Continue to monitor  Severe protein energy malnutrition -Continue tube feeds -If able to swallow safely, okay to eat meals.  Best practice:  Diet: NPO/ EN Pain/Anxiety/Delirium protocol (if indicated): discontinued  VAP protocol (if indicated):  DVT prophylaxis:  Eliquis GI prophylaxis: PPI Glucose control:  Mobility: push PT efforts Code Status: Full Family Communication: Daughter updated at bedside 11/3 Disposition: ICU  Labs   CBC: Recent Labs  Lab 04/29/20 0551 04/29/20 0551 04/30/20 0339 04/30/20 0339 05/01/20 0720 05/01/20 0914 05/01/20 1445 05/01/20 1747 05/02/20 0601  WBC 15.9*  --  13.2*  --  13.6* 14.0*  --   --  7.9  NEUTROABS  --   --   --   --   --  12.7*  --   --   --   HGB 9.3*   < > 9.5*   < > 9.4* 9.5* 11.2* 8.5* 9.0*  HCT 29.1*   < > 29.5*   < > 30.9* 31.1* 33.0* 25.0* 28.8*  MCV 97.7  --  97.0  --  103.3* 102.6*  --   --  102.1*  PLT 358  --  379  --  458* 416*  --   --  397   < > = values in this interval not displayed.    Basic Metabolic Panel: Recent Labs  Lab 04/29/20 0551 04/29/20 0551 04/30/20 0339 04/30/20 0339 05/01/20 0720 05/01/20 0720 05/01/20 0914 05/01/20 1445 05/01/20 1609 05/01/20 1747 05/01/20 2145 05/02/20 0601  NA 149*   < > 148*   < > 142   < > 140 143 143 145  --  146*  K 3.3*   < > 3.1*   < > 3.2*   < > 3.4* 3.6 4.0 3.5  --  3.4*  CL 119*   < > 119*  --  110  --  110  --  113*  --   --  113*  CO2 18*   < > 20*  --  22  --  18*  --  22  --   --  22  GLUCOSE  123*   < > 115*  --  171*  --  186*  --  112*  --   --  128*  BUN 47*   < > 42*  --  34*  --  35*  --  35*  --   --  36*  CREATININE 2.60*   < > 2.40*  --  2.40*  --  2.50*  --  2.51*  --   --  2.73*  CALCIUM 8.3*   < > 8.3*  --  8.1*  --  7.9*  --  7.8*  --   --  8.1*  MG  --   --   --   --   --   --   --   --   --   --  1.6* 2.1  PHOS 4.0  --  3.3  --  5.7*  --   --   --  5.9*  --   --  4.4   < > = values in this interval not displayed.   GFR: Estimated Creatinine Clearance: 25.8 mL/min (A) (by C-G formula based on SCr of 2.73 mg/dL (H)). Recent Labs  Lab 04/30/20 0339 05/01/20 0720 05/01/20 0914 05/02/20 0601  PROCALCITON  --   --  0.34 0.31  WBC 13.2* 13.6* 14.0* 7.9  LATICACIDVEN  --   --  1.3  --     Liver  Function Tests: Recent Labs  Lab 04/30/20 0339 05/01/20 0720 05/01/20 0914 05/01/20 1609 05/02/20 0601  AST  --   --  21  --   --   ALT  --   --  18  --   --   ALKPHOS  --   --  96  --   --   BILITOT  --   --  0.5  --   --   PROT  --   --  5.2*  --   --   ALBUMIN 1.6* 1.6* 1.7* 1.8* 2.1*   No results for input(s): LIPASE, AMYLASE in the last 168 hours. Recent Labs  Lab 05/01/20 0914  AMMONIA 66*    ABG    Component Value Date/Time   PHART 7.236 (L) 05/01/2020 1747   PCO2ART 52.7 (H) 05/01/2020 1747   PO2ART 76 (L) 05/01/2020 1747   HCO3 22.2 05/01/2020 1747   TCO2 24 05/01/2020 1747   ACIDBASEDEF 5.0 (H) 05/01/2020 1747   O2SAT 92.0 05/01/2020 1747     Coagulation Profile: Recent Labs  Lab 04/26/20 1020  INR 1.5*    Cardiac Enzymes: No results for input(s): CKTOTAL, CKMB, CKMBINDEX, TROPONINI in the last 168 hours.  HbA1C: No results found for: HGBA1C  CBG: Recent Labs  Lab 05/01/20 1014 05/01/20 1600 05/01/20 2039 05/02/20 0017 05/02/20 0436  GLUCAP 161* 110* 111* 114* 110*     This patient is critically ill with multiple organ system failure which requires frequent high complexity decision making, assessment, support, evaluation, and titration of therapies. This was completed through the application of advanced monitoring technologies and extensive interpretation of multiple databases. During this encounter critical care time was devoted to patient care services described in this note for 36 minutes.   Julian Hy, DO 05/02/20 12:48 PM [facility]

## 2020-05-02 NOTE — Progress Notes (Signed)
[facility] Progress Note    Assessment/ Plan:   AKI: Likely secondary to acute tubular injury in the context of hypotension, worsening anemia.  PVL unable to assess left renal artery but does appear to have flow in the parenchyma.  Right renal artery is okay.  C3 not decreased.. BL normal renal function.  Decrease intravascular volume likely playing a role.  Diuresis with albumin yesterday due to hypoxia but this was largely related to somnolence as well as his pulmonary infiltrates concerning for pneumonia.  His albumin is very low so we will try albumin repletion today. -Start albumin 25 g twice daily, likely can stop once serum albumin is greater than 2.5 -Discussed antibiotic change with primary team given concern about renal dysfunction with bank/Zosyn combo -Consider further diuretics as needed for hypoxia -Peak Crt 3.1 now fluctuating likely from hemodynamic shifts. -Possible left renal artery compromise given it was not visualized on PVL but cannot say for sure  Hypernatremia: Sodium normalized yesterday but now increased to 146 likely secondary to poor free water intake.  Encourage ongoing hydration.  May start back free water if sodium increases further.  Metabolic acidosis/respiratory acidosis -Secondary to AKI but diarrhea also contributing.  Bicarb improved to 22.  Continue oral bicarbonate 1300 mg twice daily for now. -Titrate down lactulose based on stool output  H/o HTN:  Blood pressure variable.  Currently on metoprolol mainly for rate control.  Monitor closely for hypotension  Hypokalemia: Potassium 3.4.  Continue oral potassium repletion with goal of potassium of 4 given arrhythmia.  Anemia -Transfuse for Hgb<7 g/dL-transfused on 10/30.  On Aranesp 100 mcg weekly  A. fib.  Currently on amio, anticoagulation resumed-  Cards- s/p cardioversion but then had recurrence-continue with potassium repletion  Encephalopathy: ICU delirium & benzos, also with  hypercarbia.  Management per primary service. 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 fib with RVR overnight requiring amiodarone.  Borderline hypotension.  Urine output about the same as previous.  Creatinine largely unchanged at 2.7.  Patient is more awake and alert today.   Objective:   BP (!) 110/59   Pulse 66   Temp 98.1 F (36.7 C) (Oral)   Resp (!) 27   Ht 6\' 3"  (1.905 m)   Wt 101 kg   SpO2 100%   BMI 27.83 kg/m   Intake/Output Summary (Last 24 hours) at 05/02/2020 1011 Last data filed at 05/02/2020 0800 Gross per 24 hour  Intake 1639.24 ml  Output 3145 ml  Net -1505.76 ml   Weight change:   Physical Exam: Gen: Lying in bed, no distress HEENT: dry mucosal membranes CVS: Normal rate, irregular rhythm Resp: Coarse bilateral breath sounds,  bilateral chest rise PYK:DXIP Ext: pitting edema in all 4 extremities Neuro: Awake, alert, disoriented  Imaging: DG CHEST PORT 1 VIEW  Result Date: 05/01/2020 CLINICAL DATA:  Respiratory distress. EXAM: PORTABLE CHEST 1 VIEW COMPARISON:  04/27/2020. FINDINGS: Heart size stable. Diffuse bilateral scratched it diffuse severe bilateral pulmonary infiltrates/edema again noted without interim change. Bilateral pleural effusions. No pneumothorax. No acute bony abnormality. IMPRESSION: Diffuse severe bilateral pulmonary infiltrates/edema again noted without interim change. Bilateral pleural effusions. Electronically Signed   By: Marcello Moores  Register   On: 05/01/2020 09:29   DG Abd Portable 1V  Result Date: 05/01/2020 CLINICAL DATA:  NG tube placement. EXAM: PORTABLE ABDOMEN - 1 VIEW COMPARISON:  04/21/2020  CT abdomen pelvis. 04/17/2020 abdominal radiographs. FINDINGS: Non weighted enteric tube tip and side hole overlie the gastric body. Residual enteric contrast opacifies nondilated large bowel. Bibasilar opacities are  better evaluated on concurrent chest radiograph. IMPRESSION: Enteric tube tip and side hole overlie the gastric body. Electronically Signed   By: Primitivo Gauze M.D.   On: 05/01/2020 11:36   VAS US RENAL ARTERY DUPLEX  Result Date: 04/30/2020 ABDOMINAL VISCERAL Indications: AKI. 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 Other Factors: AAA. Limitations: Air/bowel gas, obesity, shortness of breath, patient position, midline incision, and restricted mobility. Comparison Study: No prior study Performing Technologist: Maudry Mayhew MHA, RDMS, RVT, RDCS  Examination Guidelines: A complete evaluation includes B-mode imaging, spectral Doppler, color Doppler, and power Doppler as needed of all accessible portions of each vessel. Bilateral testing is considered an integral part of a complete examination. Limited examinations for reoccurring indications may be performed as noted.  Duplex Findings: +----------------------+--------+--------+------+------------------+ Mesenteric            PSV cm/sEDV cm/sPlaque     Comments      +----------------------+--------+--------+------+------------------+ Aorta Prox                                  Unable to insonate +----------------------+--------+--------+------+------------------+ Celiac Artery Proximal                      Unable to insonate +----------------------+--------+--------+------+------------------+ SMA Proximal                                Unable to insonate +----------------------+--------+--------+------+------------------+    +------------------+--------+--------+------------------+ Right Renal ArteryPSV cm/sEDV cm/s     Comment       +------------------+--------+--------+------------------+ Origin                            Unable to insonate +------------------+--------+--------+------------------+ Proximal             68      16                       +------------------+--------+--------+------------------+ Mid                  59      13                      +------------------+--------+--------+------------------+ Distal               72      15                      +------------------+--------+--------+------------------+ +-----------------+--------+--------+------------------+ Left Renal ArteryPSV cm/sEDV cm/s     Comment       +-----------------+--------+--------+------------------+ Origin                           Unable to insonate +-----------------+--------+--------+------------------+ Proximal                         Unable to insonate +-----------------+--------+--------+------------------+ Mid                              Unable to insonate +-----------------+--------+--------+------------------+  Distal                           Unable to insonate +-----------------+--------+--------+------------------+ +------------+--------+-------+----+-----------+-----------------+--------+----+ Right KidneyPSV cm/sEDV    RI  Left KidneyPSV cm/s         EDV cm/sRI                       cm/s                                                +------------+--------+-------+----+-----------+-----------------+--------+----+ Upper Pole  25      6      0.76Upper Pole 17               8       0.50 +------------+--------+-------+----+-----------+-----------------+--------+----+ Mid         28      7      0.76Mid        20               4       0.79 +------------+--------+-------+----+-----------+-----------------+--------+----+ Lower Pole  18      7      0.60Lower Pole 18               3       0.83 +------------+--------+-------+----+-----------+-----------------+--------+----+ Hilar       20      7      0.67Hilar      Unable to                                                               insonate                       +------------+--------+-------+----+-----------+-----------------+--------+----+ +------------------+-------------------+------------------+--------------------+ Right Kidney                         Left Kidney                            +------------------+-------------------+------------------+--------------------+ RAR                                  RAR                                    +------------------+-------------------+------------------+--------------------+ RAR (manual)      Unable to calculateRAR (manual)      Unable to calculate                    due to difficulty                    due to difficulty                      insonating aorta  insonating LRA and                                                          aorta                +------------------+-------------------+------------------+--------------------+ Cortex            13/4 cm/s          Cortex            15/5 cm/s            +------------------+-------------------+------------------+--------------------+ Cortex thickness                     Corex thickness                        +------------------+-------------------+------------------+--------------------+ Kidney length (cm)11.10              Kidney length (cm)11.80                +------------------+-------------------+------------------+--------------------+  Summary: Renal:  Right: No evidence of right renal artery stenosis. RRV flow present. Left:  Limited evaluation of the left kidney secondary to multiple        technical limitations listed above. Perfusion noted within        left kidney with mildly elevated resistive indices.  *See table(s) above for measurements and observations.  Diagnosing physician: Ruta Hinds MD  Electronically signed by Ruta Hinds MD on 04/30/2020 at 7:10:43 PM.    Final    VAS [place] UPPER EXTREMITY VENOUS DUPLEX  Result Date: 04/30/2020 UPPER VENOUS STUDY  Indications: Edema  Limitations: Patient position, restricted mobility, bandaging. Comparison Study: No prior study Performing Technologist: Maudry Mayhew MHA, RDMS, RVT, RDCS  Examination Guidelines: A complete evaluation includes B-mode imaging, spectral Doppler, color Doppler, and power Doppler as needed of all accessible portions of each vessel. Bilateral testing is considered an integral part of a complete examination. Limited examinations for reoccurring indications may be performed as noted.  Right Findings: +----------+------------+---------+-----------+----------+-------+ RIGHT     CompressiblePhasicitySpontaneousPropertiesSummary +----------+------------+---------+-----------+----------+-------+ Subclavian               Yes       Yes                      +----------+------------+---------+-----------+----------+-------+  Left Findings: +----------+------------+---------+-----------+----------+-------+ LEFT      CompressiblePhasicitySpontaneousPropertiesSummary +----------+------------+---------+-----------+----------+-------+ IJV           Full       Yes       Yes                      +----------+------------+---------+-----------+----------+-------+ Subclavian    Full       Yes       Yes                      +----------+------------+---------+-----------+----------+-------+ Axillary      Full       Yes       Yes                      +----------+------------+---------+-----------+----------+-------+ Brachial      Full       Yes       Yes                      +----------+------------+---------+-----------+----------+-------+  Radial        Full                                          +----------+------------+---------+-----------+----------+-------+ Ulnar         Full                                          +----------+------------+---------+-----------+----------+-------+ Cephalic      Full                                           +----------+------------+---------+-----------+----------+-------+ Basilic       Full                                          +----------+------------+---------+-----------+----------+-------+  Summary:  Right: No evidence of thrombosis in the subclavian.  Left: No evidence of deep vein thrombosis in the upper extremity. No evidence of superficial vein thrombosis in the upper extremity.  *See table(s) above for measurements and observations.  Diagnosing physician: Ruta Hinds MD Electronically signed by Ruta Hinds MD on 04/30/2020 at 7:10:29 PM.    Final     Labs: BMET Recent Labs  Lab 04/28/20 2831 04/28/20 0408 04/29/20 5176 04/29/20 1607 04/30/20 3710 05/01/20 0720 05/01/20 0914 05/01/20 1445 05/01/20 1609 05/01/20 1747 05/02/20 0601  NA 149*   < > 149*   < > 148* 142 140 143 143 145 146*  K 3.5   < > 3.3*   < > 3.1* 3.2* 3.4* 3.6 4.0 3.5 3.4*  CL 121*  --  119*  --  119* 110 110  --  113*  --  113*  CO2 17*  --  18*  --  20* 22 18*  --  22  --  22  GLUCOSE 102*  --  123*  --  115* 171* 186*  --  112*  --  128*  BUN 40*  --  47*  --  42* 34* 35*  --  35*  --  36*  CREATININE 2.44*  --  2.60*  --  2.40* 2.40* 2.50*  --  2.51*  --  2.73*  CALCIUM 8.1*  --  8.3*  --  8.3* 8.1* 7.9*  --  7.8*  --  8.1*  PHOS  --   --  4.0  --  3.3 5.7*  --   --  5.9*  --  4.4   < > = values in this interval not displayed.   CBC Recent Labs  Lab 04/30/20 0339 04/30/20 0339 05/01/20 0720 05/01/20 0720 05/01/20 0914 05/01/20 1445 05/01/20 1747 05/02/20 0601  WBC 13.2*  --  13.6*  --  14.0*  --   --  7.9  NEUTROABS  --   --   --   --  12.7*  --   --   --   HGB 9.5*   < > 9.4*   < > 9.5* 11.2* 8.5* 9.0*  HCT 29.5*   < > 30.9*   < > 31.1* 33.0* 25.0* 28.8*  MCV 97.0  --  103.3*  --  102.6*  --   --  102.1*  PLT 379  --  458*  --  416*  --   --  397   < > = values in this interval not displayed.    Medications:    . acetaminophen  650 mg Oral Q6H   Or  . acetaminophen   325-650 mg Rectal Q6H  . apixaban  2.5 mg Per Tube BID  . atorvastatin  10 mg Oral QHS  . chlorhexidine  15 mL Mouth Rinse BID  . Chlorhexidine Gluconate Cloth  6 each Topical Daily  . darbepoetin (ARANESP) injection - NON-DIALYSIS  100 mcg Subcutaneous Q Mon-1800  . feeding supplement (PROSource TF)  45 mL Per Tube BID  . fluticasone furoate-vilanterol  1 puff Inhalation Daily  . free water  200 mL Per Tube Q4H  . lactulose  30 g Per Tube TID  . lidocaine  2 patch Transdermal Q24H  . mouth rinse  15 mL Mouth Rinse q12n4p  . metoprolol tartrate  62.5 mg Oral BID  . pantoprazole sodium  40 mg Per Tube Daily  . potassium chloride  40 mEq Oral Q4H  . sodium bicarbonate  1,300 mg Oral BID  . umeclidinium bromide  1 puff Inhalation Daily      [facility] 05/02/2020, 10:11 AM

## 2020-05-03 ENCOUNTER — Inpatient Hospital Stay (HOSPITAL_COMMUNITY): Payer: Medicare HMO

## 2020-05-03 DIAGNOSIS — E43 Unspecified severe protein-calorie malnutrition: Secondary | ICD-10-CM | POA: Diagnosis not present

## 2020-05-03 DIAGNOSIS — I480 Paroxysmal atrial fibrillation: Secondary | ICD-10-CM

## 2020-05-03 DIAGNOSIS — J181 Lobar pneumonia, unspecified organism: Secondary | ICD-10-CM

## 2020-05-03 DIAGNOSIS — N179 Acute kidney failure, unspecified: Secondary | ICD-10-CM | POA: Diagnosis not present

## 2020-05-03 DIAGNOSIS — N171 Acute kidney failure with acute cortical necrosis: Secondary | ICD-10-CM | POA: Diagnosis not present

## 2020-05-03 DIAGNOSIS — J9601 Acute respiratory failure with hypoxia: Secondary | ICD-10-CM | POA: Diagnosis not present

## 2020-05-03 DIAGNOSIS — I714 Abdominal aortic aneurysm, without rupture: Secondary | ICD-10-CM | POA: Diagnosis not present

## 2020-05-03 LAB — CBC
HCT: 29.5 % — ABNORMAL LOW (ref 39.0–52.0)
Hemoglobin: 9.2 g/dL — ABNORMAL LOW (ref 13.0–17.0)
MCH: 31.9 pg (ref 26.0–34.0)
MCHC: 31.2 g/dL (ref 30.0–36.0)
MCV: 102.4 fL — ABNORMAL HIGH (ref 80.0–100.0)
Platelets: 421 10*3/uL — ABNORMAL HIGH (ref 150–400)
RBC: 2.88 MIL/uL — ABNORMAL LOW (ref 4.22–5.81)
RDW: 17.2 % — ABNORMAL HIGH (ref 11.5–15.5)
WBC: 10 10*3/uL (ref 4.0–10.5)
nRBC: 0 % (ref 0.0–0.2)

## 2020-05-03 LAB — CULTURE, RESPIRATORY W GRAM STAIN: Culture: NORMAL

## 2020-05-03 LAB — RENAL FUNCTION PANEL
Albumin: 2.5 g/dL — ABNORMAL LOW (ref 3.5–5.0)
Albumin: 2.7 g/dL — ABNORMAL LOW (ref 3.5–5.0)
Anion gap: 10 (ref 5–15)
Anion gap: 10 (ref 5–15)
BUN: 33 mg/dL — ABNORMAL HIGH (ref 8–23)
BUN: 31 mg/dL — ABNORMAL HIGH (ref 8–23)
CO2: 21 mmol/L — ABNORMAL LOW (ref 22–32)
CO2: 23 mmol/L (ref 22–32)
Calcium: 8.2 mg/dL — ABNORMAL LOW (ref 8.9–10.3)
Calcium: 8.4 mg/dL — ABNORMAL LOW (ref 8.9–10.3)
Chloride: 116 mmol/L — ABNORMAL HIGH (ref 98–111)
Chloride: 114 mmol/L — ABNORMAL HIGH (ref 98–111)
Creatinine, Ser: 2.49 mg/dL — ABNORMAL HIGH (ref 0.61–1.24)
Creatinine, Ser: 2.23 mg/dL — ABNORMAL HIGH (ref 0.61–1.24)
GFR, Estimated: 25 mL/min — ABNORMAL LOW (ref >60)
GFR, Estimated: 29 mL/min — ABNORMAL LOW (ref >60)
Glucose, Bld: 134 mg/dL — ABNORMAL HIGH (ref 70–99)
Glucose, Bld: 133 mg/dL — ABNORMAL HIGH (ref 70–99)
Phosphorus: 3.5 mg/dL (ref 2.5–4.6)
Phosphorus: 3.6 mg/dL (ref 2.5–4.6)
Potassium: 3.6 mmol/L (ref 3.5–5.1)
Potassium: 3.9 mmol/L (ref 3.5–5.1)
Sodium: 147 mmol/L — ABNORMAL HIGH (ref 135–145)
Sodium: 147 mmol/L — ABNORMAL HIGH (ref 135–145)

## 2020-05-03 LAB — PROCALCITONIN: Procalcitonin: 0.13 ng/mL

## 2020-05-03 LAB — GLUCOSE, CAPILLARY
Glucose-Capillary: 110 mg/dL — ABNORMAL HIGH (ref 70–99)
Glucose-Capillary: 116 mg/dL — ABNORMAL HIGH (ref 70–99)
Glucose-Capillary: 126 mg/dL — ABNORMAL HIGH (ref 70–99)
Glucose-Capillary: 132 mg/dL — ABNORMAL HIGH (ref 70–99)
Glucose-Capillary: 133 mg/dL — ABNORMAL HIGH (ref 70–99)

## 2020-05-03 LAB — AMMONIA: Ammonia: 22 umol/L (ref 9–35)

## 2020-05-03 LAB — MAGNESIUM: Magnesium: 1.9 mg/dL (ref 1.7–2.4)

## 2020-05-03 LAB — PHOSPHORUS: Phosphorus: 3.4 mg/dL (ref 2.5–4.6)

## 2020-05-03 MED ORDER — AMIODARONE HCL 200 MG PO TABS: 200.0000 mg | ORAL_TABLET | Freq: Two times a day (BID) | ORAL | Status: DC

## 2020-05-03 MED ORDER — METOPROLOL TARTRATE 25 MG/10 ML ORAL SUSPENSION
62.5000 mg | Freq: Two times a day (BID) | ORAL | Status: DC
  Administered 2020-05-03 (×2): 62.5 mg
  Filled 2020-05-03 (×3): qty 30

## 2020-05-03 MED ORDER — POTASSIUM CHLORIDE 20 MEQ PO PACK: 40.0000 meq | PACK | Freq: Once | ORAL | Status: DC

## 2020-05-03 MED ORDER — MAGNESIUM SULFATE 2 GM/50ML IV SOLN
2.0000 g | Freq: Once | INTRAVENOUS | Status: AC
  Administered 2020-05-03: 2 g via INTRAVENOUS
  Filled 2020-05-03: qty 50

## 2020-05-03 MED ORDER — MELATONIN 3 MG PO TABS: 3.0000 mg | ORAL_TABLET | Freq: Every day | ORAL | Status: DC

## 2020-05-03 MED ORDER — MELATONIN 3 MG PO TABS
3.0000 mg | ORAL_TABLET | Freq: Every day | ORAL | Status: DC
  Administered 2020-05-03: 3 mg
  Filled 2020-05-03: qty 1

## 2020-05-03 MED ORDER — SODIUM BICARBONATE 650 MG PO TABS: 1300.0000 mg | ORAL_TABLET | Freq: Two times a day (BID) | ORAL | Status: DC

## 2020-05-03 MED ORDER — FREE WATER
250.0000 mL | Status: DC
  Administered 2020-05-03 – 2020-05-04 (×5): 250 mL

## 2020-05-03 MED ORDER — ATORVASTATIN CALCIUM 10 MG PO TABS
10.0000 mg | ORAL_TABLET | Freq: Every day | ORAL | Status: DC
  Administered 2020-05-03 – 2020-05-29 (×27): 10 mg
  Filled 2020-05-03 (×28): qty 1

## 2020-05-03 MED ORDER — POTASSIUM CITRATE ER 10 MEQ (1080 MG) PO TBCR
20.0000 meq | EXTENDED_RELEASE_TABLET | Freq: Two times a day (BID) | ORAL | Status: DC
  Administered 2020-05-03 (×2): 20 meq via ORAL
  Filled 2020-05-03 (×4): qty 2

## 2020-05-03 MED ORDER — ALBUMIN HUMAN 25 % IV SOLN
25.0000 g | Freq: Two times a day (BID) | INTRAVENOUS | Status: AC
  Administered 2020-05-03: 25 g via INTRAVENOUS
  Filled 2020-05-03: qty 100

## 2020-05-03 MED ORDER — AMIODARONE HCL 200 MG PO TABS
200.0000 mg | ORAL_TABLET | Freq: Two times a day (BID) | ORAL | Status: DC
  Administered 2020-05-03 – 2020-05-06 (×8): 200 mg
  Filled 2020-05-03 (×8): qty 1

## 2020-05-03 NOTE — Progress Notes (Signed)
OT Cancellation Note  Patient Details Name: ZAIDAN KEEBLE MRN: [id number] DOB: February 09, 1941   Cancelled Treatment:    Reason Eval/Treat Not Completed: Medical issues which prohibited therapy Pt with increased 02 demand, weakness and lethargy. Will follow.   Malka So 05/03/2020, 1:47 PM  Nestor Lewandowsky, OTR/L Acute Rehabilitation Services Pager: [phone] Office: [phone]

## 2020-05-03 NOTE — Progress Notes (Signed)
SLP Cancellation Note  Patient Details Name: Carlos Olson MRN: [id number] DOB: 1941/03/16   Cancelled treatment:       Reason Eval/Treat Not Completed: Medical issues which prohibited therapy. (Pt's case was discussed with RNs. They indicated that the pt now has increased O2 needs and is now on 10L compared to the 2L from 11/3. She also stated that the pt did not sleep well last night and she is trying to facilitate him resting. SLP will follow up on subsequent date.)  Wilian Kwong I. Hardin Negus, [place], [place] Office number [phone] Pager [place] 05/03/2020, 3:18 PM

## 2020-05-03 NOTE — Progress Notes (Signed)
Physical Therapy Treatment Patient Details Name: Carlos Olson MRN: [id number] DOB: 27-May-1941 Today's Date: 05/03/2020    History of Present Illness Pt admit with abdominal aortic aneurysm, bilateral common iliac artery aneurysms, right hypogastric artery aneurysm, right common femoral artery aneurysm.  Underwent aortobifemoral BPG.  Multiple complications since admission.     PT Comments    Pt admitted with above diagnosis. Pt was limited today by desaturation to 84% on up to 15 L O2 with activity. Nursing made aware and were addressing with pt on PT departure.  Will continue to progress PT as able. Pt currently with functional limitations due to balance and endurance deficits. Pt will benefit from skilled PT to increase their independence and safety with mobility to allow discharge to the venue listed below.     Follow Up Recommendations  Supervision/Assistance - 24 hour;CIR     Equipment Recommendations  3in1 (PT)    Recommendations for Other Services       Precautions / Restrictions Precautions Precautions: Fall Precaution Comments: 02 Restrictions Weight Bearing Restrictions: No    Mobility  Bed Mobility Overal bed mobility: Needs Assistance Bed Mobility: Supine to Sit Rolling: Mod assist Sidelying to sit: +2 for physical assistance;Mod assist Supine to sit: Mod assist;+2 for physical assistance     General bed mobility comments: Pt needed mod assist for bed mobility wit incr time to complete tasks.   Transfers Overall transfer level: Needs assistance Equipment used:  [place] walker) Transfers: Sit to/from [facility] Sit to Stand: +2 physical assistance;Max assist;From elevated surface;Total assist         General transfer comment: 70 bpm, 97% O2 on 8L on arrival.  147/78.   Pt allowed to pull up on Eva walker with one UE, however with +2 total assist pt could not stand.  Noted pt with anterior lean with pt appearing very weak and not responding  as well.  Quickly started to assist pt back to bed and obtained BP with BP 174/82.  O2 was not reading therefore incr O2 from 8 -10L and O2 registered at 84%.  Incr to 15L and got nursing to come in.  Had pt scooted up in bed and nursing came to suction pt nasally and called the NP and RT.  Pt was now responsive and talking some but with eyes closed.  After they suctioned pt sats 91% on 15L.  NP still in the room.    Ambulation/Gait                 Stairs             Wheelchair Mobility    Modified Rankin (Stroke Patients Only)       Balance Overall balance assessment: Needs assistance Sitting-balance support: No upper extremity supported;Feet supported;Bilateral upper extremity supported Sitting balance-Leahy Scale: Poor Sitting balance - Comments: Pt was able to sit EOB initiailly however pt needed max assist after sitting a few minutes which seemed to be due to desaturation.                                     Cognition Arousal/Alertness: Awake/alert Behavior During Therapy: Flat affect Overall Cognitive Status: Impaired/Different from baseline Area of Impairment: Following commands;Memory;Attention;Problem solving                   Current Attention Level: Sustained Memory: Decreased short-term memory Following Commands: Follows one step  commands with increased time     Problem Solving: Slow processing;Decreased initiation;Difficulty sequencing;Requires verbal cues General Comments: Pt talking less today.      Exercises General Exercises - Lower Extremity Ankle Circles/Pumps: AROM;Both;10 reps;Seated Long Arc Quad: AROM;Both;10 reps;Seated    General Comments        Pertinent Vitals/Pain Pain Assessment: Faces Faces Pain Scale: Hurts little more Pain Location: groin bilaterally, abdomen Pain Descriptors / Indicators: Aching;Grimacing;Guarding Pain Intervention(s): Limited activity within patient's tolerance;Monitored during  session;Repositioned    Home Living                      Prior Function            PT Goals (current goals can now be found in the care plan section) Acute Rehab PT Goals Patient Stated Goal: to go home Progress towards PT goals: Not progressing toward goals - comment (desaturation)    Frequency    Min 3X/week      PT Plan Current plan remains appropriate    Co-evaluation              AM-PAC PT "6 Clicks" Mobility   Outcome Measure  Help needed turning from your back to your side while in a flat bed without using bedrails?: A Lot Help needed moving from lying on your back to sitting on the side of a flat bed without using bedrails?: A Lot Help needed moving to and from a bed to a chair (including a wheelchair)?: Total Help needed standing up from a chair using your arms (e.g., wheelchair or bedside chair)?: Total Help needed to walk in hospital room?: Total Help needed climbing 3-5 steps with a railing? : Total 6 Click Score: 8    End of Session Equipment Utilized During Treatment: Gait belt;Oxygen Activity Tolerance: Patient limited by fatigue (desaturation) Patient left: with call bell/phone within reach;in bed;with bed alarm set;with nursing/sitter in room Nurse Communication: Mobility status PT Visit Diagnosis: Muscle weakness (generalized) (M62.81);Pain Pain - part of body:  (back and groin)     Time: 1029-1100 PT Time Calculation (min) (ACUTE ONLY): 31 min  Charges:  $Therapeutic Activity: 23-37 mins                     Geniyah Eischeid W,PT Acute Rehabilitation Services Pager:  [phone]  Office:  [place] 05/03/2020, 11:14 AM

## 2020-05-03 NOTE — Progress Notes (Signed)
AAA Progress Note    05/03/2020 7:36 AM 7 Days Post-Op  Subjective:  Sleeping; awakes easily  Afebrile HR 60's-80's NSR [id number]'[id number]'Z systolic 30% [id number]  Gtts:   Amiodarone   Vitals:   05/03/20 0600 05/03/20 0700  BP: (!) 166/92 (!) 169/82  Pulse: 77 78  Resp: (!) 31 (!) 26  Temp:    SpO2: (!) 89% 95%    Physical Exam: Cardiac:  regular Lungs:  Coarse breath sounds bilaterally Abdomen:  Soft, NT/ND; +BM Incisions:  All incisions healing nicely Extremities:  Bilateral feet are warm and well perfused.  Motor and sensory are in tact.  General:  Appears comfortable  CBC    Component Value Date/Time   WBC 10.0 05/03/2020 0610   RBC 2.88 (L) 05/03/2020 0610   HGB 9.2 (L) 05/03/2020 0610   HGB 12.1 (L) 08/20/2012 0610   HCT 29.5 (L) 05/03/2020 0610   HCT 44.0 08/02/2012 0853   PLT 421 (H) 05/03/2020 0610   PLT 158 08/20/2012 0610   MCV 102.4 (H) 05/03/2020 0610   MCV 99 08/02/2012 0853   MCH 31.9 05/03/2020 0610   MCHC 31.2 05/03/2020 0610   RDW 17.2 (H) 05/03/2020 0610   RDW 13.7 08/02/2012 0853   LYMPHSABS 0.4 (L) 05/01/2020 0914   MONOABS 0.6 05/01/2020 0914   EOSABS 0.0 05/01/2020 0914   BASOSABS 0.0 05/01/2020 0914    BMET    Component Value Date/Time   NA 147 (H) 05/03/2020 0610   NA 136 08/20/2012 0610   K 3.6 05/03/2020 0610   K 4.2 08/20/2012 0610   CL 116 (H) 05/03/2020 0610   CL 102 08/20/2012 0610   CO2 21 (L) 05/03/2020 0610   CO2 26 08/20/2012 0610   GLUCOSE 134 (H) 05/03/2020 0610   GLUCOSE 123 (H) 08/20/2012 0610   BUN 33 (H) 05/03/2020 0610   BUN 7 08/20/2012 0610   CREATININE 2.49 (H) 05/03/2020 0610   CREATININE 0.75 08/20/2012 0610   CALCIUM 8.2 (L) 05/03/2020 0610   CALCIUM 8.4 (L) 08/20/2012 0610   GFRNONAA 25 (L) 05/03/2020 0610   GFRNONAA >60 08/20/2012 0610   GFRAA >60 05/07/2017 0949   GFRAA >60 08/20/2012 0610    INR    Component Value Date/Time   INR 1.5 (H) 04/26/2020 1020   INR 1.0 08/02/2012 0853      Intake/Output Summary (Last 24 hours) at 05/03/2020 0736 Last data filed at 05/03/2020 0700 Gross per 24 hour  Intake 2378.45 ml  Output 1500 ml  Net 878.45 ml     Assessment/Plan:  79 y.o. 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  Need to mobilize-worked with PT yesterday , pulmonary toilet -Neuro:  Awake and following commands; alert to place and year -Renal:  Creatinine improved this am to 2.49 from 2.68; albumin 25g bid started by nephrology.  Diuresis per renal.  Hypernatremia-Na+ 147 this am up from 146.  Free water per renal.   -GI:  Abdomen remains soft and NT; +BM now with rectal tube.  Tolerating TF -Incisions:  Laparotomy and right groin incisions are healing nicely -Heme/ID:  Acute surgical  blood loss anemia is stable-slightly improved from yesterday at 9.2 from 9.0.  Increase in WBC from 7.9 to 10.  Continue to monitor-pt remains afebrile.  Vanc discontinued and pt remains on Zosyn.  -General:  Resting comfortably this morning.  -DVT prophylaxis:  Elquis (afib)   Leontine Locket, PA-C [facility] [phone] 05/03/2020 7:36 AM  I have independently interviewed and examined patient and agree with PA assessment and plan above. Incisions healing well. Feet are well perfused.  Primary issues continue to be pulmonary and neurologic with ongoing delirium.  Critical care is on board and aggressively treating both of these issues and their assistance much appreciated.  Nephrology also following as has cardiology been heavily involved they are also much appreciated.  I  have updated the wife at the bedside.  Appears that once he can clear his delirium and participate in clearing secretions we can see significant improvement.  Andilyn Bettcher C. Donzetta Matters, MD [facility] [place] Office: [phone] Pager: [phone]

## 2020-05-03 NOTE — Progress Notes (Signed)
[facility] Progress Note    Assessment/ Plan:   AKI: Likely secondary to acute tubular injury in the context of hypotension, worsening anemia.  PVL unable to assess left renal artery but does appear to have flow in the parenchyma.  Right renal artery is okay.  C3 not decreased.. BL normal renal function.  Have given albumin but overall kidney function stable. -Stop albumin, serum improved -Now off Vanc which may help -Consider further diuretics as needed for hypoxia; mostly from pneumonia but could help if needed -Peak Crt 3.1 now fluctuating likely from hemodynamic shifts. -Possible left renal artery compromise given it was not visualized on PVL but cannot say for sure  Hypernatremia: Sodium 147. Inc free water as able. Will stop Na bicarb as below  Metabolic acidosis/Hyperchloremia/Hypokalemia: multifactorial w/ AKI and diarrhea contributing -40 mEq of potassium today -Stop sodium bicarbonate -Start potassium citrate 5meq twice daily -Titrate down lactulose based on stool output  H/o HTN:  Blood pressure variable.  Currently on metoprolol mainly for rate control.  Increase blood pressure may contribute to worsening hypoxia.  If blood pressure remains elevated today could consider addition of other blood pressure medications such as hydralazine in the acute setting, amlodipine long-term.  Anemia -Transfuse for Hgb<7 g/dL-transfused on 10/30.  On Aranesp 100 mcg weekly  A. fib.    Intermittent issue.  Currently controlled on amiodarone and metoprolol.  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 BP has been higher. Confused this morning.   Objective:   BP 137/86 (BP Location: Right Arm)   Pulse 75   Temp 97.7 F (36.5 C)  (Oral)   Resp 20   Ht 6\' 3"  (1.905 m)   Wt 103 kg   SpO2 93%   BMI 28.38 kg/m   Intake/Output Summary (Last 24 hours) at 05/03/2020 0955 Last data filed at 05/03/2020 0800 Gross per 24 hour  Intake 2045.68 ml  Output 1500 ml  Net 545.68 ml   Weight change: 2 kg  Physical Exam: Gen: Lying in bed, no distress HEENT: dry mucosal membranes CVS: Normal rate, irregular rhythm Resp: Coarse bilateral breath sounds,  bilateral chest rise QPY:[id number] Ext: pitting edema in all 4 extremities Neuro: Awake, alert, disoriented  Imaging: DG Abd Portable 1V  Result Date: 05/01/2020 CLINICAL DATA:  NG tube placement. EXAM: PORTABLE ABDOMEN - 1 VIEW COMPARISON:  04/21/2020 CT abdomen pelvis. 04/17/2020 abdominal radiographs. FINDINGS: Non weighted enteric tube tip and side hole overlie the gastric body. Residual enteric contrast opacifies nondilated large bowel. Bibasilar opacities are better evaluated on concurrent chest radiograph. IMPRESSION: Enteric tube tip and side hole overlie the gastric body. Electronically Signed   By: Primitivo Gauze M.D.   On: 05/01/2020 11:36    Labs: BMET Recent Labs  Lab 04/29/20 0551 04/29/20 0551 04/30/20 0339 04/30/20 0339 05/01/20 0720 05/01/20 0720 05/01/20 0914 05/01/20 1445 05/01/20 1609 05/01/20 1747 05/02/20 0601 05/02/20 1534 05/03/20 0610  NA 149*   < > 148*   < > 142   < > 140 143 143 145 146* 146* 147*  K 3.3*   < > 3.1*   < > 3.2*   < > 3.4* 3.6 4.0 3.5 3.4* 3.6 3.6  CL 119*   < > 119*  --  110  --  110  --  113*  --  113* 115* 116*  CO2 18*   < > 20*  --  22  --  18*  --  22  --  22 21* 21*  GLUCOSE 123*   < > 115*  --  171*  --  186*  --  112*  --  128* 108* 134*  BUN 47*   < > 42*  --  34*  --  35*  --  35*  --  36* 38* 33*  CREATININE 2.60*   < > 2.40*  --  2.40*  --  2.50*  --  2.51*  --  2.73* 2.68* 2.49*  CALCIUM 8.3*   < > 8.3*  --  8.1*  --  7.9*  --  7.8*  --  8.1* 8.3* 8.2*  PHOS 4.0  --  3.3  --  5.7*  --   --   --  5.9*   --  4.4 3.4 3.4  3.5   < > = values in this interval not displayed.   CBC Recent Labs  Lab 05/01/20 0720 05/01/20 0720 05/01/20 0914 05/01/20 0914 05/01/20 1445 05/01/20 1747 05/02/20 0601 05/03/20 0610  WBC 13.6*  --  14.0*  --   --   --  7.9 10.0  NEUTROABS  --   --  12.7*  --   --   --   --   --   HGB 9.4*   < > 9.5*   < > 11.2* 8.5* 9.0* 9.2*  HCT 30.9*   < > 31.1*   < > 33.0* 25.0* 28.8* 29.5*  MCV 103.3*  --  102.6*  --   --   --  102.1* 102.4*  PLT 458*  --  416*  --   --   --  397 421*   < > = values in this interval not displayed.    Medications:    . acetaminophen  650 mg Oral Q6H   Or  . acetaminophen  325-650 mg Rectal Q6H  . amiodarone  200 mg Per Tube BID  . apixaban  2.5 mg Per Tube BID  . atorvastatin  10 mg Per Tube QHS  . chlorhexidine  15 mL Mouth Rinse BID  . Chlorhexidine Gluconate Cloth  6 each Topical Daily  . darbepoetin (ARANESP) injection - NON-DIALYSIS  100 mcg Subcutaneous Q Mon-1800  . feeding supplement (PROSource TF)  45 mL Per Tube BID  . fluticasone furoate-vilanterol  1 puff Inhalation Daily  . free water  250 mL Per Tube Q4H  . lactulose  30 g Per Tube BID  . lidocaine  2 patch Transdermal Q24H  . mouth rinse  15 mL Mouth Rinse q12n4p  . melatonin  3 mg Per Tube QHS  . metoprolol tartrate  62.5 mg Per Tube BID  . pantoprazole sodium  40 mg Per Tube Daily  . potassium chloride  40 mEq Per Tube Once  . potassium citrate  20 mEq Oral BID  . umeclidinium bromide  1 puff Inhalation Daily      [facility] 05/03/2020, 9:55 AM

## 2020-05-03 NOTE — Progress Notes (Signed)
Progress Note  Patient Name: Carlos Olson Date of Encounter: 05/03/2020  Primary Cardiologist: Werner Lean, MD   Subjective   70 M with Coronary Artery Calcifications, Thoracic Aortic Aneurysm and baseline creatinine 1.19 who presented for AAA 04/17/20 with prolonged course:  Called 04/18/20 for hypotension, through course developed A fib RVR s/p 04/26/20 DCCV to NSR with quick return to atrial fibrillation, ICU Delirium, Aspiration PNA, and AKI.  Uptriaged 05/02/20 for hypercapnia, AMS, and hypotension. With diuresis, prior hypernatremia has returned.   Overnight no events.  Feels well, undergoing Chest PT with some improvement.  Inpatient Medications    Scheduled Meds: . acetaminophen  650 mg Oral Q6H   Or  . acetaminophen  325-650 mg Rectal Q6H  . apixaban  2.5 mg Per Tube BID  . atorvastatin  10 mg Per Tube QHS  . chlorhexidine  15 mL Mouth Rinse BID  . Chlorhexidine Gluconate Cloth  6 each Topical Daily  . darbepoetin (ARANESP) injection - NON-DIALYSIS  100 mcg Subcutaneous Q Mon-1800  . feeding supplement (PROSource TF)  45 mL Per Tube BID  . fluticasone furoate-vilanterol  1 puff Inhalation Daily  . free water  200 mL Per Tube Q4H  . lactulose  30 g Per Tube BID  . lidocaine  2 patch Transdermal Q24H  . mouth rinse  15 mL Mouth Rinse q12n4p  . melatonin  3 mg Per Tube QHS  . metoprolol tartrate  62.5 mg Per Tube BID  . pantoprazole sodium  40 mg Per Tube Daily  . potassium chloride  40 mEq Per Tube Once  . sodium bicarbonate  1,300 mg Per Tube BID  . umeclidinium bromide  1 puff Inhalation Daily   Continuous Infusions: . sodium chloride Stopped (05/03/20 0058)  . albumin human    . amiodarone 30 mg/hr (05/03/20 0700)  . feeding supplement (VITAL 1.5 CAL) 60 mL/hr at 05/03/20 0100  . piperacillin-tazobactam (ZOSYN)  IV Stopped (05/03/20 0459)   PRN Meds: alum & mag hydroxide-simeth, bisacodyl, Gerhardt's butt cream, naLOXone (NARCAN)  injection,  ondansetron, oxymetazoline   Vital Signs    Vitals:   05/03/20 0500 05/03/20 0600 05/03/20 0700 05/03/20 0742  BP: (!) 147/77 (!) 166/92 (!) 169/82   Pulse: 73 77 78   Resp: (!) 23 (!) 31 (!) 26   Temp:    97.7 F (36.5 C)  TempSrc:    Oral  SpO2: 97% (!) 89% 95%   Weight: 103 kg     Height:        Intake/Output Summary (Last 24 hours) at 05/03/2020 0754 Last data filed at 05/03/2020 0700 Gross per 24 hour  Intake 2378.45 ml  Output 1500 ml  Net 878.45 ml   Filed Weights   05/02/20 0500 05/03/20 0500  Weight: 101 kg 103 kg    Telemetry    SR with occasional PVCs - Personally Reviewed  ECG    No new - Personally Reviewed  Physical Exam   GEN: No acute distress, awake Neck: No JVD near supine Cardiac: RRR, no murmurs, rubs, or gallops.  Respiratory: Coarse breath sounds bilaterally GI: Soft, nontender, non-distended  MS: +2 edema, warm, R femoral slight hardening Neuro:  Nonfocal  Psych: Normal affect   Labs    Chemistry Recent Labs  Lab 05/01/20 0914 05/01/20 1445 05/02/20 0601 05/02/20 1534 05/03/20 0610  NA 140   < > 146* 146* 147*  K 3.4*   < > 3.4* 3.6 3.6  CL 110   < >  113* 115* 116*  CO2 18*   < > 22 21* 21*  GLUCOSE 186*   < > 128* 108* 134*  BUN 35*   < > 36* 38* 33*  CREATININE 2.50*   < > 2.73* 2.68* 2.49*  CALCIUM 7.9*   < > 8.1* 8.3* 8.2*  PROT 5.2*  --   --   --   --   ALBUMIN 1.7*   < > 2.1* 2.5* 2.5*  AST 21  --   --   --   --   ALT 18  --   --   --   --   ALKPHOS 96  --   --   --   --   BILITOT 0.5  --   --   --   --   GFRNONAA 25*   < > 23* 23* 25*  ANIONGAP 12   < > 11 10 10    < > = values in this interval not displayed.    Hematology Recent Labs  Lab 05/01/20 0914 05/01/20 1445 05/01/20 1747 05/02/20 0601 05/03/20 0610  WBC 14.0*  --   --  7.9 10.0  RBC 3.03*  --   --  2.82* 2.88*  HGB 9.5*   < > 8.5* 9.0* 9.2*  HCT 31.1*   < > 25.0* 28.8* 29.5*  MCV 102.6*  --   --  102.1* 102.4*  MCH 31.4  --   --  31.9 31.9    MCHC 30.5  --   --  31.3 31.2  RDW 17.2*  --   --  17.1* 17.2*  PLT 416*  --   --  397 421*   < > = values in this interval not displayed.   Cardiac EnzymesNo results for input(s): TROPONINI in the last 168 hours. No results for input(s): TROPIPOC in the last 168 hours.   BNP Recent Labs  Lab 05/01/20 0914  BNP 461.1*     DDimer No results for input(s): DDIMER in the last 168 hours.   Radiology    DG CHEST PORT 1 VIEW  Result Date: 05/01/2020 CLINICAL DATA:  Respiratory distress. EXAM: PORTABLE CHEST 1 VIEW COMPARISON:  04/27/2020. FINDINGS: Heart size stable. Diffuse bilateral scratched it diffuse severe bilateral pulmonary infiltrates/edema again noted without interim change. Bilateral pleural effusions. No pneumothorax. No acute bony abnormality. IMPRESSION: Diffuse severe bilateral pulmonary infiltrates/edema again noted without interim change. Bilateral pleural effusions. Electronically Signed   By: Marcello Moores  Register   On: 05/01/2020 09:29   DG Abd Portable 1V  Result Date: 05/01/2020 CLINICAL DATA:  NG tube placement. EXAM: PORTABLE ABDOMEN - 1 VIEW COMPARISON:  04/21/2020 CT abdomen pelvis. 04/17/2020 abdominal radiographs. FINDINGS: Non weighted enteric tube tip and side hole overlie the gastric body. Residual enteric contrast opacifies nondilated large bowel. Bibasilar opacities are better evaluated on concurrent chest radiograph. IMPRESSION: Enteric tube tip and side hole overlie the gastric body. Electronically Signed   By: Primitivo Gauze M.D.   On: 05/01/2020 11:36    Cardiac Studies   None new  Patient Profile     79 y.o. male s/p AAA repair with complicated postoperative course with AKI, volume overload, and post operative AF  Assessment & Plan    Post operative Afib with RVR HTN Blood loss anemia Thoracic and Aortic Aneurysm - s/p DCCV 04/26/20 with conversion to NSR, but back to Afib RVR shortly thereafter - converted to SR 05/02/20 - echo with EF 50%  with moderate LAE and mild RAE -  Will transition back to Amiodarone 200 mg BID - continue eliquis 2.5 mg BID --> will need uninterrupted AC for 4 weeks from 10/28/21s/p DCCV - lopressor 62.5 mg BID with controlled rates   AKI Aspiration PNA  Hypoxic and hypercapneic respiratory failure Malnutrition with albumin < 2 Hypernatremia - per nephrology and primary; pharmacy assisting with dosing based on kidney - patient has total body volume overload, hypernatremia, hypoalbuminemia.  Reasonable to pause diuresis; though will eventually need volume off  For questions or updates, please contact [place] Please consult [email or web address] for contact info under Cardiology/STEMI.      Signed, Werner Lean, MD  05/03/2020, 7:54 AM

## 2020-05-03 NOTE — Progress Notes (Addendum)
NAME:  Carlos Olson, MRN:  [id number], DOB:  12-Nov-1940, LOS: 61 ADMISSION DATE:  04/17/2020, CONSULTATION DATE: 10/21 REFERRING MD: Dr. Donzetta Matters, CHIEF COMPLAINT: AKI with mild hypoxia  Brief History   79 year old male who presented 10/18 for elective endovascular repair abdominal aortic aneurysm with Dr. Donzetta Matters. PCCM consulted 10/21 for assistance in medical management.  Hospital course complicated by AKI, metabolic encephalopathy, hypoxia/ RLL pneumonia, and hypotension. Treated in ICU for sepsis, AFRVR, underwent cardioversion, ABX course. Moved out of ICU. Ongoing course significant for AKI, AF, debility. Then 11/2 he became encephalopathic overnight and was transferred back to ICU. PCCM re-consulted.   Past Medical History  AAA History of prostate cancer Heart murmur Diverticulitis COPD CAD atherosclerosis  Significant Hospital Events   Admitted 10/18 for elective AAA repair 10/19 OR   Consults:  Cardiology PCCM Nephrology GI  Procedures:  Endovascular AAA repair 10/19  Significant Diagnostic Tests:  04/21/2020 CT A/P > Status post abdominal aortic repair with aortobifemoral bypass graft. Some stranding is noted in the operative site likely related to postoperative change. No sizable hematoma is seen. Diverticulosis without diverticulitis. Near complete consolidation of the right lower lobe. Small left pleural effusion is seen with left basilar atelectasis. 10/25 CTH > 1. Stable.  No acute intracranial abnormality. 2. Atrophy with chronic small vessel white matter ischemic disease.  Micro Data:  Blood 10/28 > negative Sputum 10/28 > few candida  Antimicrobials:  10/19 cefazolin 10/22 zosyn > 10/26 Vancomycin 10/29 > 11/2  Zosyn 10/29 > 11/3 Fluconazole 10/29 >>>  Interim history/subjective:  Denies pain. Sleepy this AM. Staff reports patient was awake most of the night.   Objective   Blood pressure 137/86, pulse 75, temperature 97.7 F (36.5 C), temperature  source Oral, resp. rate 20, height 6\' 3"  (1.905 m), weight 103 kg, SpO2 93 %.    FiO2 (%):  [36 %] 36 %   Intake/Output Summary (Last 24 hours) at 05/03/2020 0849 Last data filed at 05/03/2020 0800 Gross per 24 hour  Intake 2114.73 ml  Output 1500 ml  Net 614.73 ml   Filed Weights   05/02/20 0500 05/03/20 0500  Weight: 101 kg 103 kg   Examination: General: Elderly man lying in bed, mild tachypnea  HEENT: Dry MM. Small bore feeding tube noted to right nare.  Neuro: Sleepy, awakens with physical stimulation, follows simple comamnds. CV: Regular rate and rhythm, no murmurs PULM: Rhonchi upper lobes, weak cough   GI: Soft, nontender, nondistended.  Midline incision > clean, dry  Extremities: +2 BLE edema   Resolved Hospital Problem list     Assessment & Plan:   Acute hypoxic respiratory failure-likely resolving pneumonia.  Acute hypercapnia now resolved, was likely driven by metabolic/toxic encephalopathy. Ammonia elevated, recent dilaudid administration in the setting of renal failure with an expected response to Narcan. Plan -Continue supplemental oxygen as required to maintain SPO2 greater than 90.  Titrate down as able. Currently on 7L [place] (up from 2-3 L overnight). Will obtain CXR. Concern with mucous plugging as well.  -Chest PT, frequent NT suctioning. -Nasal MRSA swab negative. Vancomycin D/C. Diflucan completed 5 days. Continue Zosyn. > Follow Culture Data.  -Requires ongoing ICU care for frequent respiratory interventions  Acute metabolic encephalopathy: due to dilaudid & hyperammoniemia  -His daughter has asked repeatedly that opiates not be administered given his severe encephalopathy. Plan -Continue lactulose twice daily, repeat Ammonia this afternoon, lactulose held overnight due to >1L out -Frequent reorientation, mobility -Will add melatonin nightly. -Tylenol for  pain.  AKI: likely secondary to ATN in the setting of waxing and waning hemodynamics. Hypokalemia due  to diuresis Hypernatremia Plan -Nephrology Following  -Hold further diuresis due to rising NA -Scheduled Albumin per Nephrology  -Strict I/O -Renally dose meds and avoid nephrotoxic meds -Cautious potassium repletion -Continue free water enterally, increase to 250 q 4 hours   Atrial fibrillation, new onset.  Currently rate controlled. S/p DCCV 10/28 with successful conversion, but now back in AF.  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  Culture before antibiotics started unfortunately unrevealing. His clinical course does not seem consistent with the small amount of candidate in that culture. -MRSA nare swab 11/3 negative -Competed 5 days of Fluconazole  Plan -Continue Zosyn  -Continue to follow respiratory culture -Nasotracheal suctioning as needed and CPT every 4 hours while awake. -Have asked for Vest PT while awake  -Continue flutter and I-S.  Out of bed mobility.  History of COPD; FEV1 65% of predicted Former smoker History of pulmonary nodules Primary pulmonologist is Dr. 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  Acute on chronic anemia due to acute blood loss Plan: -Trend CBC -Transfuse for hemoglobin <7  Severe protein energy malnutrition Dysphagia  Plan -Continue tube feeds -ST 11/3 recommended continued NPO except ice chips/water following oral care   Best practice:  Diet: NPO/ EN Pain/Anxiety/Delirium protocol (if indicated): discontinued  VAP protocol (if  indicated):  DVT prophylaxis: Eliquis GI prophylaxis: PPI Glucose control:  Mobility: push PT efforts Code Status: Full Family Communication: wife updated at bedside 11/4 Disposition: ICU  Labs   CBC: Recent Labs  Lab 04/30/20 0339 04/30/20 0339 05/01/20 0720 05/01/20 0720 05/01/20 0914 05/01/20 1445 05/01/20 1747 05/02/20 0601 05/03/20 0610  WBC 13.2*  --  13.6*  --  14.0*  --   --  7.9 10.0  NEUTROABS  --   --   --   --  12.7*  --   --   --   --   HGB 9.5*   < > 9.4*   < > 9.5* 11.2* 8.5* 9.0* 9.2*  HCT 29.5*   < > 30.9*   < > 31.1* 33.0* 25.0* 28.8* 29.5*  MCV 97.0  --  103.3*  --  102.6*  --   --  102.1* 102.4*  PLT 379  --  458*  --  416*  --   --  397 421*   < > = values in this interval not displayed.    Basic Metabolic Panel: Recent Labs  Lab 05/01/20 0720 05/01/20 0720 05/01/20 0914 05/01/20 1445 05/01/20 1609 05/01/20 1747 05/01/20 2145 05/02/20 0601 05/02/20 1534 05/03/20 0610  NA 142   < > 140   < > 143 145  --  146* 146* 147*  K 3.2*   < > 3.4*   < > 4.0 3.5  --  3.4* 3.6 3.6  CL 110   < > 110  --  113*  --   --  113* 115* 116*  CO2 22   < > 18*  --  22  --   --  22 21* 21*  GLUCOSE 171*   < > 186*  --  112*  --   --  128* 108* 134*  BUN 34*   < > 35*  --  35*  --   --  36* 38* 33*  CREATININE 2.40*   < > 2.50*  --  2.51*  --   --  2.73* 2.68* 2.49*  CALCIUM 8.1*   < > 7.9*  --  7.8*  --   --  8.1* 8.3* 8.2*  MG  --   --   --   --   --   --  1.6* 2.1  --  1.9  PHOS 5.7*  --   --   --  5.9*  --   --  4.4 3.4 3.4  3.5   < > = values in this interval not displayed.   GFR: Estimated Creatinine Clearance: 30.8 mL/min (A) (by C-G formula based on SCr of 2.49 mg/dL (H)). Recent Labs  Lab 05/01/20 0720 05/01/20 0914 05/02/20 0601 05/03/20 0610  PROCALCITON  --  0.34 0.31 0.13  WBC 13.6* 14.0* 7.9 10.0  LATICACIDVEN  --  1.3  --   --     Liver Function Tests: Recent Labs  Lab 05/01/20 0914 05/01/20 1609 05/02/20 0601 05/02/20 1534  05/03/20 0610  AST 21  --   --   --   --   ALT 18  --   --   --   --   ALKPHOS 96  --   --   --   --   BILITOT 0.5  --   --   --   --   PROT 5.2*  --   --   --   --   ALBUMIN 1.7* 1.8* 2.1* 2.5* 2.5*   No results for input(s): LIPASE, AMYLASE in the last 168 hours. Recent Labs  Lab 05/01/20 0914  AMMONIA 66*    ABG    Component Value Date/Time   PHART 7.236 (L) 05/01/2020 1747   PCO2ART 52.7 (H) 05/01/2020 1747   PO2ART 76 (L) 05/01/2020 1747   HCO3 22.2 05/01/2020 1747   TCO2 24 05/01/2020 1747   ACIDBASEDEF 5.0 (H) 05/01/2020 1747   O2SAT 92.0 05/01/2020 1747     Coagulation Profile: Recent Labs  Lab 04/26/20 1020  INR 1.5*    Cardiac Enzymes: No results for input(s): CKTOTAL, CKMB, CKMBINDEX, TROPONINI in the last 168 hours.  HbA1C: No results found for: HGBA1C  CBG: Recent Labs  Lab 05/02/20 1249 05/02/20 1614 05/02/20 2010 05/02/20 2345 05/03/20 0744  GLUCAP 110* 109* 122* 137* 126*     This patient is critically ill with multiple organ system failure which requires frequent high complexity decision making, assessment, support, evaluation, and titration of therapies. This was completed through the application of advanced monitoring technologies and extensive interpretation of multiple databases. During this encounter critical care time was devoted to patient care services described in this note for 32 minutes.  Hayden Pedro, AGACNP-BC [facility]  Pgr: [phone]  PCCM Pgr: [phone]

## 2020-05-04 ENCOUNTER — Encounter: Payer: Medicare HMO | Admitting: Vascular Surgery

## 2020-05-04 ENCOUNTER — Inpatient Hospital Stay (HOSPITAL_COMMUNITY): Payer: Medicare HMO

## 2020-05-04 DIAGNOSIS — N170 Acute kidney failure with tubular necrosis: Secondary | ICD-10-CM | POA: Diagnosis not present

## 2020-05-04 DIAGNOSIS — E43 Unspecified severe protein-calorie malnutrition: Secondary | ICD-10-CM | POA: Diagnosis not present

## 2020-05-04 DIAGNOSIS — I714 Abdominal aortic aneurysm, without rupture: Secondary | ICD-10-CM | POA: Diagnosis not present

## 2020-05-04 DIAGNOSIS — I480 Paroxysmal atrial fibrillation: Secondary | ICD-10-CM | POA: Diagnosis not present

## 2020-05-04 LAB — RENAL FUNCTION PANEL
Albumin: 2.4 g/dL — ABNORMAL LOW (ref 3.5–5.0)
Albumin: 2 g/dL — ABNORMAL LOW (ref 3.5–5.0)
Anion gap: 10 (ref 5–15)
Anion gap: 11 (ref 5–15)
BUN: 29 mg/dL — ABNORMAL HIGH (ref 8–23)
BUN: 37 mg/dL — ABNORMAL HIGH (ref 8–23)
CO2: 22 mmol/L (ref 22–32)
CO2: 20 mmol/L — ABNORMAL LOW (ref 22–32)
Calcium: 8.4 mg/dL — ABNORMAL LOW (ref 8.9–10.3)
Calcium: 7.9 mg/dL — ABNORMAL LOW (ref 8.9–10.3)
Chloride: 115 mmol/L — ABNORMAL HIGH (ref 98–111)
Chloride: 114 mmol/L — ABNORMAL HIGH (ref 98–111)
Creatinine, Ser: 2.19 mg/dL — ABNORMAL HIGH (ref 0.61–1.24)
Creatinine, Ser: 2.34 mg/dL — ABNORMAL HIGH (ref 0.61–1.24)
GFR, Estimated: 30 mL/min — ABNORMAL LOW (ref >60)
GFR, Estimated: 27 mL/min — ABNORMAL LOW (ref >60)
Glucose, Bld: 147 mg/dL — ABNORMAL HIGH (ref 70–99)
Glucose, Bld: 132 mg/dL — ABNORMAL HIGH (ref 70–99)
Phosphorus: 3.7 mg/dL (ref 2.5–4.6)
Phosphorus: 3.2 mg/dL (ref 2.5–4.6)
Potassium: 3.8 mmol/L (ref 3.5–5.1)
Potassium: 4 mmol/L (ref 3.5–5.1)
Sodium: 147 mmol/L — ABNORMAL HIGH (ref 135–145)
Sodium: 145 mmol/L (ref 135–145)

## 2020-05-04 LAB — GLUCOSE, CAPILLARY
Glucose-Capillary: 106 mg/dL — ABNORMAL HIGH (ref 70–99)
Glucose-Capillary: 120 mg/dL — ABNORMAL HIGH (ref 70–99)
Glucose-Capillary: 123 mg/dL — ABNORMAL HIGH (ref 70–99)
Glucose-Capillary: 129 mg/dL — ABNORMAL HIGH (ref 70–99)
Glucose-Capillary: 132 mg/dL — ABNORMAL HIGH (ref 70–99)
Glucose-Capillary: 91 mg/dL (ref 70–99)

## 2020-05-04 LAB — POCT I-STAT 7, (LYTES, BLD GAS, ICA,H+H)
Acid-base deficit: 4 mmol/L — ABNORMAL HIGH (ref 0.0–2.0)
Acid-base deficit: 3 mmol/L — ABNORMAL HIGH (ref 0.0–2.0)
Bicarbonate: 25.3 mmol/L (ref 20.0–28.0)
Bicarbonate: 24.2 mmol/L (ref 20.0–28.0)
Calcium, Ion: 1.28 mmol/L (ref 1.15–1.40)
Calcium, Ion: 1.24 mmol/L (ref 1.15–1.40)
HCT: 29 % — ABNORMAL LOW (ref 39.0–52.0)
HCT: 29 % — ABNORMAL LOW (ref 39.0–52.0)
Hemoglobin: 9.9 g/dL — ABNORMAL LOW (ref 13.0–17.0)
Hemoglobin: 9.9 g/dL — ABNORMAL LOW (ref 13.0–17.0)
O2 Saturation: 90 %
O2 Saturation: 99 %
Patient temperature: 98.6
Potassium: 4.3 mmol/L (ref 3.5–5.1)
Potassium: 4.5 mmol/L (ref 3.5–5.1)
Sodium: 148 mmol/L — ABNORMAL HIGH (ref 135–145)
Sodium: 149 mmol/L — ABNORMAL HIGH (ref 135–145)
TCO2: 27 mmol/L (ref 22–32)
TCO2: 26 mmol/L (ref 22–32)
pCO2 arterial: 72.7 mmHg (ref 32.0–48.0)
pCO2 arterial: 55.8 mmHg — ABNORMAL HIGH (ref 32.0–48.0)
pH, Arterial: 7.149 — CL (ref 7.350–7.450)
pH, Arterial: 7.245 — ABNORMAL LOW (ref 7.350–7.450)
pO2, Arterial: 77 mmHg — ABNORMAL LOW (ref 83.0–108.0)
pO2, Arterial: 155 mmHg — ABNORMAL HIGH (ref 83.0–108.0)

## 2020-05-04 LAB — CBC
HCT: 30.8 % — ABNORMAL LOW (ref 39.0–52.0)
Hemoglobin: 9.4 g/dL — ABNORMAL LOW (ref 13.0–17.0)
MCH: 32 pg (ref 26.0–34.0)
MCHC: 30.5 g/dL (ref 30.0–36.0)
MCV: 104.8 fL — ABNORMAL HIGH (ref 80.0–100.0)
Platelets: 455 10*3/uL — ABNORMAL HIGH (ref 150–400)
RBC: 2.94 MIL/uL — ABNORMAL LOW (ref 4.22–5.81)
RDW: 17.1 % — ABNORMAL HIGH (ref 11.5–15.5)
WBC: 11.4 10*3/uL — ABNORMAL HIGH (ref 4.0–10.5)
nRBC: 0 % (ref 0.0–0.2)

## 2020-05-04 LAB — PHOSPHORUS: Phosphorus: 3.7 mg/dL (ref 2.5–4.6)

## 2020-05-04 LAB — MAGNESIUM: Magnesium: 2.2 mg/dL (ref 1.7–2.4)

## 2020-05-04 MED ORDER — PROSOURCE TF PO LIQD
45.0000 mL | Freq: Three times a day (TID) | ORAL | Status: DC
  Administered 2020-05-04 – 2020-05-30 (×79): 45 mL
  Filled 2020-05-04 (×77): qty 45

## 2020-05-04 MED ORDER — SODIUM CHLORIDE 0.9 % IV SOLN: INTRAVENOUS | Status: DC

## 2020-05-04 MED ORDER — PHENYLEPHRINE 40 MCG/ML (10ML) SYRINGE FOR IV PUSH (FOR BLOOD PRESSURE SUPPORT)
PREFILLED_SYRINGE | INTRAVENOUS | Status: AC
  Filled 2020-05-04: qty 10

## 2020-05-04 MED ORDER — DOCUSATE SODIUM 50 MG/5ML PO LIQD
100.0000 mg | Freq: Two times a day (BID) | ORAL | Status: DC
  Filled 2020-05-04: qty 10

## 2020-05-04 MED ORDER — ALUM & MAG HYDROXIDE-SIMETH 200-200-20 MG/5ML PO SUSP: 15.0000 mL | ORAL | Status: DC | PRN

## 2020-05-04 MED ORDER — POTASSIUM CITRATE-CITRIC ACID 1100-334 MG/5ML PO SOLN
10.0000 meq | Freq: Two times a day (BID) | ORAL | Status: DC
  Filled 2020-05-04 (×2): qty 5

## 2020-05-04 MED ORDER — ACETAMINOPHEN 325 MG RE SUPP: 325.0000 mg | Freq: Four times a day (QID) | RECTAL | Status: DC

## 2020-05-04 MED ORDER — POLYETHYLENE GLYCOL 3350 17 G PO PACK: 17.0000 g | PACK | Freq: Every day | ORAL | Status: DC

## 2020-05-04 MED ORDER — ROCURONIUM BROMIDE 50 MG/5ML IV SOLN
80.0000 mg | Freq: Once | INTRAVENOUS | Status: AC
  Administered 2020-05-04: 80 mg via INTRAVENOUS

## 2020-05-04 MED ORDER — DEXMEDETOMIDINE HCL IN NACL 400 MCG/100ML IV SOLN
0.0000 ug/kg/h | INTRAVENOUS | Status: AC
  Administered 2020-05-04 (×2): 0.6 ug/kg/h via INTRAVENOUS
  Administered 2020-05-04: 0.4 ug/kg/h via INTRAVENOUS
  Administered 2020-05-05: 0.7 ug/kg/h via INTRAVENOUS
  Administered 2020-05-05 (×2): 0.6 ug/kg/h via INTRAVENOUS
  Administered 2020-05-06: 1.5 ug/kg/h via INTRAVENOUS
  Administered 2020-05-06: 1.4 ug/kg/h via INTRAVENOUS
  Administered 2020-05-06: 0.8 ug/kg/h via INTRAVENOUS
  Administered 2020-05-06: 1.5 ug/kg/h via INTRAVENOUS
  Administered 2020-05-06 (×2): 0.9 ug/kg/h via INTRAVENOUS
  Administered 2020-05-06 – 2020-05-07 (×3): 1.5 ug/kg/h via INTRAVENOUS
  Filled 2020-05-04 (×14): qty 100

## 2020-05-04 MED ORDER — DOCUSATE SODIUM 50 MG/5ML PO LIQD: 100.0000 mg | Freq: Two times a day (BID) | ORAL | Status: DC

## 2020-05-04 MED ORDER — ACETAMINOPHEN 160 MG/5ML PO SOLN
650.0000 mg | Freq: Four times a day (QID) | ORAL | Status: DC
  Filled 2020-05-04: qty 20.3

## 2020-05-04 MED ORDER — ETOMIDATE 2 MG/ML IV SOLN
20.0000 mg | Freq: Once | INTRAVENOUS | Status: AC
  Administered 2020-05-04: 20 mg via INTRAVENOUS

## 2020-05-04 MED ORDER — FREE WATER
300.0000 mL | Status: DC
  Administered 2020-05-04 – 2020-05-15 (×62): 300 mL

## 2020-05-04 MED ORDER — ACETAMINOPHEN 160 MG/5ML PO SOLN
650.0000 mg | Freq: Four times a day (QID) | ORAL | Status: DC
  Administered 2020-05-04 – 2020-05-13 (×36): 650 mg
  Filled 2020-05-04 (×35): qty 20.3

## 2020-05-04 NOTE — Procedures (Signed)
Intubation Procedure Note  REGNALD BOWENS  [id number]  1941-02-22  Date:05/04/20  Time:6:24 AM   Provider Performing:Kreig Parson C Tamala Julian    Procedure: Intubation (54627)  Indication(s) Respiratory Failure  Consent Unable to obtain consent due to emergent nature of procedure.   Anesthesia Etomidate and Rocuronium   Time Out Verified patient identification, verified procedure, site/side was marked, verified correct patient position, special equipment/implants available, medications/allergies/relevant history reviewed, required imaging and test results available.   Sterile Technique Usual hand hygeine, masks, and gloves were used   Procedure Description Patient positioned in bed supine.  Sedation given as noted above.  Patient was intubated with endotracheal tube using Glidescope.  View was Grade 1 full glottis .  Number of attempts was 1.  Colorimetric CO2 detector was consistent with tracheal placement.   Complications/Tolerance None; patient tolerated the procedure well. Chest X-ray is ordered to verify placement.   EBL Minimal   Specimen(s) None

## 2020-05-04 NOTE — Progress Notes (Signed)
SLP Cancellation Note  Patient Details Name: Carlos Olson MRN: [id number] DOB: 11/02/1940   Cancelled treatment:       Reason Eval/Treat Not Completed: Medical issues which prohibited therapy (Pt has been re-intubated this morning. SLP will sign off at this time, but please re-consult as clinically indicated.)  Makhia Vosler I. Hardin Negus, [place], [place] Office number [phone] Pager [place] 05/04/2020, 9:23 AM

## 2020-05-04 NOTE — Progress Notes (Signed)
ETT advanced from 25cm to 28cm per MD order. Bilateral breath sounds heard and good volumes remain on the ventilator. Pt tolerated fairly well. Pt continues to have moderate to copious secretions. RT will continue to monitor.

## 2020-05-04 NOTE — Progress Notes (Signed)
[facility] Progress Note    Assessment/ Plan:   AKI: Likely secondary to acute tubular injury in the context of hypotension, worsening anemia.  PVL unable to assess left renal artery but does appear to have flow in the parenchyma.  Right renal artery is okay.  C3 not decreased.. BL normal renal function.  Kidney function has remained relatively stable despite his ongoing acute illness.  Some concern for dehydration today -IV fluids per CCM -Continue monitoring creatinine closely, may rise with his worsening clinical status -Could consider diuretics for hypoxia as below -Peak Crt 3.1 now fluctuating likely from hemodynamic shifts. -Possible left renal artery compromise given it was not visualized on PVL but cannot say for sure  Hypernatremia: Sodium continues to rise to 149 today.  Likely lack of free water. -Increase free water flushes to 300 cc every 4 hour -May need IV D5 water if sodium fails to improve -Renal function panel this afternoon  Hypoxia: Likely multifactorial with history pneumonia.  Discussed with critical care who feels inability to clear secretions playing a big role.  Pulmonary edema could be a contributing factor.  If aggressive airway clearance does not help 1 could consider diuretics.  Metabolic acidosis/Hyperchloremia/Hypokalemia: multifactorial w/ AKI and diarrhea contributing - continue potassium citrate 20 mEq twice a day -Titrate down lactulose based on stool output  H/o HTN:  Blood pressure variable related to atrial fibrillation and sedation.  Can titrate meds as needed.  Anemia -Transfuse for Hgb<7 g/dL-transfused on 10/30.  On Aranesp 100 mcg weekly  A. fib.    Intermittent issue related to multiple factors.  Continue management per cardiology  Encephalopathy: Multifactorial with metabolic encephalopathy, medications, ICU delirium contributing unfortunately now intubated again  Elective open abdominal and common iliac artery  aneurysm and right common femoral artery aneurysm repair: mgmt per vasc surg    Subjective:   Worsening hypercarbia again last night requiring intubation.  Acidosis has improved with intubation.  Persistent hypoxia and chest x-ray with bilateral opacities.   Objective:   BP (!) 107/54   Pulse 67   Temp 99.9 F (37.7 C) (Oral)   Resp (!) 26   Ht 6\' 3"  (1.905 m)   Wt 103 kg   SpO2 96%   BMI 28.38 kg/m   Intake/Output Summary (Last 24 hours) at 05/04/2020 1044 Last data filed at 05/04/2020 0700 Gross per 24 hour  Intake 1284.11 ml  Output 1850 ml  Net -565.89 ml   Weight change:   Physical Exam: Gen: In bed, sedated HEENT: dry mucosal membranes, ET tube in place CVS: Rate, no audible murmur normal rate, no audible murmur Resp: Coarse bilateral breath sounds,  bilateral chest rise [id number], nondistended Ext: pitting edema in all 4 extremities Neuro: Sedated, does not respond to questioning  Imaging: DG CHEST PORT 1 VIEW  Result Date: 05/04/2020 CLINICAL DATA:  Intubation. EXAM: PORTABLE CHEST 1 VIEW COMPARISON:  05/03/2020. FINDINGS: Endotracheal tube noted with tip 6 cm above the carina. Feeding tube noted with tip below left hemidiaphragm. Heart size normal. Diffuse severe bilateral pulmonary infiltrates/edema again noted without interim change. Small bilateral pleural effusions, right side greater than left noted on today's exam. No pneumothorax. IMPRESSION: 1. Endotracheal tube and feeding tube in good anatomic position. 2. Diffuse severe bilateral pulmonary infiltrates/edema again noted without interim change. Small bilateral pleural effusions, right side greater than left noted on today's exam. Electronically Signed   By: [place]   On: 05/04/2020 06:43   DG CHEST PORT 1  VIEW  Result Date: 05/03/2020 CLINICAL DATA:  Hypoxia EXAM: PORTABLE CHEST 1 VIEW COMPARISON:  Chest radiograph dated 05/01/2020. FINDINGS: The cardiac silhouette is unchanged. Severe diffuse  bilateral interstitial and airspace opacities appear slightly increased on the left and unchanged on the right. A small left pleural effusion may contribute. There is no right pleural effusion. There is no pneumothorax. An enteric tube enters the stomach and terminates below the field of view. Degenerative changes are seen in the spine. IMPRESSION: Severe diffuse bilateral interstitial and airspace opacities, slightly increased on the left and unchanged on the right. A small left pleural effusion may contribute. Electronically Signed   By: Zerita Boers M.D.   On: 05/03/2020 10:18    Labs: BMET Recent Labs  Lab 05/01/20 0720 05/01/20 0720 05/01/20 0914 05/01/20 1445 05/01/20 1609 05/01/20 1747 05/02/20 0601 05/02/20 1534 05/03/20 0610 05/03/20 1618 05/04/20 0049 05/04/20 0544 05/04/20 0833  NA 142   < > 140   < > 143   < > 146* 146* 147* 147* 147* 148* 149*  K 3.2*   < > 3.4*   < > 4.0   < > 3.4* 3.6 3.6 3.9 3.8 4.3 4.5  CL 110   < > 110  --  113*  --  113* 115* 116* 114* 115*  --   --   CO2 22   < > 18*  --  22  --  22 21* 21* 23 22  --   --   GLUCOSE 171*   < > 186*  --  112*  --  128* 108* 134* 133* 147*  --   --   BUN 34*   < > 35*  --  35*  --  36* 38* 33* 31* 29*  --   --   CREATININE 2.40*   < > 2.50*  --  2.51*  --  2.73* 2.68* 2.49* 2.23* 2.19*  --   --   CALCIUM 8.1*   < > 7.9*  --  7.8*  --  8.1* 8.3* 8.2* 8.4* 8.4*  --   --   PHOS 5.7*  --   --   --  5.9*  --  4.4 3.4 3.4  3.5 3.6 3.7  3.7  --   --    < > = values in this interval not displayed.   CBC Recent Labs  Lab 05/01/20 0914 05/01/20 1445 05/02/20 0601 05/02/20 0601 05/03/20 0610 05/04/20 0049 05/04/20 0544 05/04/20 0833  WBC 14.0*  --  7.9  --  10.0 11.4*  --   --   NEUTROABS 12.7*  --   --   --   --   --   --   --   HGB 9.5*   < > 9.0*   < > 9.2* 9.4* 9.9* 9.9*  HCT 31.1*   < > 28.8*   < > 29.5* 30.8* 29.0* 29.0*  MCV 102.6*  --  102.1*  --  102.4* 104.8*  --   --   PLT 416*  --  397  --  421*  455*  --   --    < > = values in this interval not displayed.    Medications:    . acetaminophen  650 mg Oral Q6H   Or  . acetaminophen  325-650 mg Rectal Q6H  . amiodarone  200 mg Per Tube BID  . apixaban  2.5 mg Per Tube BID  . atorvastatin  10 mg Per Tube QHS  .  chlorhexidine  15 mL Mouth Rinse BID  . Chlorhexidine Gluconate Cloth  6 each Topical Daily  . darbepoetin (ARANESP) injection - NON-DIALYSIS  100 mcg Subcutaneous Q Mon-1800  . docusate  100 mg Per Tube BID  . feeding supplement (PROSource TF)  45 mL Per Tube BID  . fluticasone furoate-vilanterol  1 puff Inhalation Daily  . free water  250 mL Per Tube Q4H  . lactulose  30 g Per Tube BID  . lidocaine  2 patch Transdermal Q24H  . mouth rinse  15 mL Mouth Rinse q12n4p  . pantoprazole sodium  40 mg Per Tube Daily  . polyethylene glycol  17 g Per Tube Daily  . potassium citrate  20 mEq Oral BID  . umeclidinium bromide  1 puff Inhalation Daily      [facility] 05/04/2020, 10:44 AM

## 2020-05-04 NOTE — Plan of Care (Signed)
°  Problem: Education: Goal: Knowledge of General Education information will improve Description: Including pain rating scale, medication(s)/side effects and non-pharmacologic comfort measures Outcome: Progressing   Problem: Health Behavior/Discharge Planning: Goal: Ability to manage health-related needs will improve Outcome: Progressing   Problem: Clinical Measurements: Goal: Ability to maintain clinical measurements within normal limits will improve Outcome: Progressing Goal: Will remain free from infection Outcome: Progressing Goal: Diagnostic test results will improve Outcome: Progressing Goal: Respiratory complications will improve Outcome: Progressing Goal: Cardiovascular complication will be avoided Outcome: Progressing   Problem: Activity: Goal: Risk for activity intolerance will decrease Outcome: Progressing   Problem: Nutrition: Goal: Adequate nutrition will be maintained Outcome: Progressing   Problem: Coping: Goal: Level of anxiety will decrease Outcome: Progressing   Problem: Elimination: Goal: Will not experience complications related to bowel motility Outcome: Progressing Goal: Will not experience complications related to urinary retention Outcome: Progressing   Problem: Pain Managment: Goal: General experience of comfort will improve Outcome: Progressing   Problem: Safety: Goal: Ability to remain free from injury will improve Outcome: Progressing   Problem: Skin Integrity: Goal: Risk for impaired skin integrity will decrease Outcome: Progressing   Problem: Education: Goal: Knowledge of the prescribed therapeutic regimen will improve Outcome: Progressing   Problem: Bowel/Gastric: Goal: Gastrointestinal status for postoperative course will improve Outcome: Progressing   Problem: Cardiac: Goal: Ability to maintain an adequate cardiac output will improve Outcome: Progressing   Problem: Clinical Measurements: Goal: Postoperative complications  will be avoided or minimized Outcome: Progressing   Problem: Respiratory: Goal: Respiratory status will improve Outcome: Progressing   Problem: Skin Integrity: Goal: Demonstration of wound healing without infection will improve Outcome: Progressing   Problem: Urinary Elimination: Goal: Ability to achieve and maintain adequate renal perfusion and functioning will improve Outcome: Progressing   

## 2020-05-04 NOTE — Progress Notes (Signed)
NAME:  Carlos Olson, MRN:  [id number], DOB:  April 19, 1941, LOS: 41 ADMISSION DATE:  04/17/2020, CONSULTATION DATE: 10/21 REFERRING MD: Dr. Donzetta Matters, CHIEF COMPLAINT: AKI with mild hypoxia  Brief History   79 year old male who presented 10/18 for elective endovascular repair abdominal aortic aneurysm with Dr. Donzetta Matters. PCCM consulted 10/21 for assistance in medical management.  Hospital course complicated by AKI, metabolic encephalopathy, hypoxia/ RLL pneumonia, and hypotension. Treated in ICU for sepsis, AFRVR, underwent cardioversion, ABX course. Moved out of ICU. Ongoing course significant for AKI, AF, debility. Then 11/2 he became encephalopathic overnight and was transferred back to ICU. PCCM re-consulted.   Past Medical History  AAA History of prostate cancer Heart murmur Diverticulitis COPD CAD atherosclerosis  Significant Hospital Events   Admitted 10/18 for elective AAA repair 10/19 OR   Consults:  Cardiology PCCM Nephrology GI  Procedures:  Endovascular AAA repair 10/19  Significant Diagnostic Tests:  04/21/2020 CT A/P > Status post abdominal aortic repair with aortobifemoral bypass graft. Some stranding is noted in the operative site likely related to postoperative change. No sizable hematoma is seen. Diverticulosis without diverticulitis. Near complete consolidation of the right lower lobe. Small left pleural effusion is seen with left basilar atelectasis. 10/25 CTH > 1. Stable.  No acute intracranial abnormality. 2. Atrophy with chronic small vessel white matter ischemic disease.  Micro Data:  Blood 10/28 > negative Sputum 10/28 > few candida  Antimicrobials:  10/19 cefazolin 10/22 zosyn > 10/26 Vancomycin 10/29 > 11/2  Zosyn 10/29 > 11/3 Fluconazole 10/29 >>>  Interim history/subjective:  Re-intubated this morning for increased respiratory distress with hypercapnia. Hypotension thereafter, improved with fluid administration.    Objective   Blood pressure (!)  95/51, pulse 64, temperature (!) 101.3 F (38.5 C), temperature source Axillary, resp. rate (!) 24, height 6\' 3"  (1.905 m), weight 103 kg, SpO2 100 %.    Vent Mode: PRVC FiO2 (%):  [70 %-100 %] 70 % Set Rate:  [15 bmp-26 bmp] 26 bmp Vt Set:  [630 mL] 630 mL PEEP:  [5 cmH20-6 cmH20] 5 cmH20 Plateau Pressure:  [17 cmH20-18 cmH20] 17 cmH20   Intake/Output Summary (Last 24 hours) at 05/04/2020 1414 Last data filed at 05/04/2020 1200 Gross per 24 hour  Intake 1069.38 ml  Output 1850 ml  Net -780.62 ml   Filed Weights   05/02/20 0500 05/03/20 0500  Weight: 101 kg 103 kg   Examination: General: Elderly man lying in bed, mild tachypnea  HEENT: Dry MM. Small bore feeding tube noted to right nare.  Neuro: Sleepy, awakens with physical stimulation, follows simple comamnds. CV: Regular rate and rhythm, no murmurs PULM: Rhonchi upper lobes, weak cough   GI: Soft, nontender, nondistended.  Midline incision > clean, dry  Extremities: +2 BLE edema   Resolved Hospital Problem list     Assessment & Plan:   Acute hypercarbic respiratory failure due to combination of oversedation from narcotics and respiratory muscle fatigue Hypotension due to hypovolemia. 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  - Complete 7days of Zosyn for pneumonia, but poor secretion clearance may be more at play - Avoid all narcotics, but continued delirium and debility despite no narcotics for >48h.  - Start weaning tomorrow. If  fails to separate from ventilator by Monday, plan for tracheostomy. - Gentle hydration. Avoid vasopressors if possible as AKI has just stabilized.  -  Continue amiodarone and apixiban enterally - Continue bronchodilators - Ensure enteral nutrition.   Best practice:  Diet: NPO/ EN Pain/Anxiety/Delirium protocol (if indicated): Precedex to RASS -1. VAP protocol (if indicated):  DVT prophylaxis: Eliquis GI prophylaxis: PPI Glucose control: euglycemic on no coverage Mobility: push PT efforts Code Status: Full Family Communication: updated daughter at bedside with Dr Donzetta Matters. We agree that patient's condition is still recoverable, but he now has a substantial hill to climb to recovery due to delirium and deconditioning. Agreed that secure nutrition and early trach would provide best hope of recovery.  Disposition: ICU  Labs   CBC: Recent Labs  Lab 05/01/20 0720 05/01/20 0720 05/01/20 0914 05/01/20 1445 05/02/20 0601 05/03/20 0610 05/04/20 0049 05/04/20 0544 05/04/20 0833  WBC 13.6*  --  14.0*  --  7.9 10.0 11.4*  --   --   NEUTROABS  --   --  12.7*  --   --   --   --   --   --   HGB 9.4*   < > 9.5*   < > 9.0* 9.2* 9.4* 9.9* 9.9*  HCT 30.9*   < > 31.1*   < > 28.8* 29.5* 30.8* 29.0* 29.0*  MCV 103.3*  --  102.6*  --  102.1* 102.4* 104.8*  --   --   PLT 458*  --  416*  --  397 421* 455*  --   --    < > = values in this interval not displayed.    Basic Metabolic Panel: Recent Labs  Lab 05/01/20 1609 05/01/20 2145 05/02/20 0601 05/02/20 0601 05/02/20 1534 05/02/20 1534 05/03/20 0610 05/03/20 1618 05/04/20 0049 05/04/20 0544 05/04/20 0833  NA   < >  --  146*   < > 146*   < > 147* 147* 147* 148* 149*  K   < >  --  3.4*   < > 3.6   < > 3.6 3.9 3.8 4.3 4.5  CL   < >  --  113*  --  115*  --  116* 114* 115*  --   --   CO2   < >  --  22  --  21*  --  21* 23 22  --   --   GLUCOSE   < >  --  128*  --  108*  --  134* 133* 147*  --   --   BUN   < >  --  36*  --  38*  --  33* 31* 29*  --   --   CREATININE   < >  --  2.73*  --  2.68*  --  2.49* 2.23* 2.19*  --   --   CALCIUM   < >  --  8.1*  --  8.3*  --  8.2* 8.4* 8.4*  --   --     MG  --  1.6* 2.1  --   --   --  1.9  --  2.2  --   --   PHOS   < >  --  4.4  --  3.4  --  3.4  3.5 3.6 3.7  3.7  --   --    < > = values in this interval not displayed.   GFR: Estimated Creatinine Clearance: 35 mL/min (A) (by C-G formula  based on SCr of 2.19 mg/dL (H)). Recent Labs  Lab 05/01/20 0914 05/02/20 0601 05/03/20 0610 05/04/20 0049  PROCALCITON 0.34 0.31 0.13  --   WBC 14.0* 7.9 10.0 11.4*  LATICACIDVEN 1.3  --   --   --     Liver Function Tests: Recent Labs  Lab 05/01/20 0914 05/01/20 1609 05/02/20 0601 05/02/20 1534 05/03/20 0610 05/03/20 1618 05/04/20 0049  AST 21  --   --   --   --   --   --   ALT 18  --   --   --   --   --   --   ALKPHOS 96  --   --   --   --   --   --   BILITOT 0.5  --   --   --   --   --   --   PROT 5.2*  --   --   --   --   --   --   ALBUMIN 1.7*   < > 2.1* 2.5* 2.5* 2.7* 2.4*   < > = values in this interval not displayed.   No results for input(s): LIPASE, AMYLASE in the last 168 hours. Recent Labs  Lab 05/01/20 0914 05/03/20 1618  AMMONIA 66* 22    ABG    Component Value Date/Time   PHART 7.245 (L) 05/04/2020 0833   PCO2ART 55.8 (H) 05/04/2020 0833   PO2ART 155 (H) 05/04/2020 0833   HCO3 24.2 05/04/2020 0833   TCO2 26 05/04/2020 0833   ACIDBASEDEF 3.0 (H) 05/04/2020 0833   O2SAT 99.0 05/04/2020 0833     Coagulation Profile: No results for input(s): INR, PROTIME in the last 168 hours.  Cardiac Enzymes: No results for input(s): CKTOTAL, CKMB, CKMBINDEX, TROPONINI in the last 168 hours.  HbA1C: No results found for: HGBA1C  CBG: Recent Labs  Lab 05/03/20 2003 05/03/20 2342 05/04/20 0354 05/04/20 0803 05/04/20 1128  GLUCAP 110* 132* 132* 120* 91   CRITICAL CARE Performed by: Kipp Brood   Total critical care time: 40 minutes  Critical care time was exclusive of separately billable procedures and treating other patients.  Critical care was necessary to treat or prevent imminent or life-threatening  deterioration.  Critical care was time spent personally by me on the following activities: development of treatment plan with patient and/or surrogate as well as nursing, discussions with consultants, evaluation of patient's response to treatment, examination of patient, obtaining history from patient or surrogate, ordering and performing treatments and interventions, ordering and review of laboratory studies, ordering and review of radiographic studies, pulse oximetry, re-evaluation of patient's condition and participation in multidisciplinary rounds.  Kipp Brood, MD [facility] ICU Physician [place]  Pager: [phone] Mobile: [phone] After hours: [phone].

## 2020-05-04 NOTE — Progress Notes (Signed)
Nutrition Follow-up  DOCUMENTATION CODES:   Severe malnutrition in context of acute illness/injury  INTERVENTION:   Tube Feeding via Cortrak:  Continue Vital 1.5 at 60 ml/hr Increase Pro-Source 45 mL to TID Provides 130 g of protein, 2280 kcals, 1094 mL  Meets 100% of estimated protein and calorie needs  Recommend discontinuing scheduled miralax and colace given significant stool output; lactulose per MD  Likely need to increase free water flushes given persistent hypernatremia and to account for losses given high stool output (1.5 L in 24 hours)   NUTRITION DIAGNOSIS:   Severe Malnutrition related to acute illness (AAA repair surgery with minimal intake x 10 days since admission) as evidenced by moderate muscle depletion, energy intake < or equal to 50% for > or equal to 5 days.  GOAL:   Patient will meet greater than or equal to 90% of their needs  MONITOR:   PO intake, Supplement acceptance  REASON FOR ASSESSMENT:   Rounds    ASSESSMENT:   79 yo male admitted 10/18 for elective endovascular repair of AAA. PMH includes AAA, prostate Ca, heart murmur, diverticulitis, COPD, CAD, atherosclerosis, IBS.  11/02 Obtunded, hypercarbic, transferred to ICU, NG tube inserted, TF started  11/03 Cortrak placed 11/05 Re-Intubated  Patient is currently intubated on ventilator support MV: 16.4 L/min Temp (24hrs), Avg:98.4 F (36.9 C), Min:97.4 F (36.3 C), Max:99.9 F (37.7 C)  Propofol: NONE  Vital 1.5 at 60 ml/hr, Pro-Source 45 mL BID via Cortrak tube  Hypernatremic, free water flush 250 mL q 4 hours per tube ordered yesterday, sodium not improved  Current wt 103 kg; admit weight 95 kg. Net +6.5 L per I/O flow seet  Significant liquid stool via rectal tube; 1500 mL in 24 hours. Noted pt has been receiving lactulose since 11/2 for elevated ammonia; ammonia 22 yesterday. Also noted, scheduled colace and miralax ordered per protocol this AM with the Pain and Agitation  Management of Vent patient order set. Recommend discontinuing the colace and miralax; plan to discuss lactulose dosing with MD as well  Labs: sodium 149 (H), Creatinine 2.19, BUN 29, ammonia 22 (wdl) Meds: colace, lactulose, miralax, potassium citrate   Diet Order:   Diet Order            Diet NPO time specified  Diet effective now                 EDUCATION NEEDS:   No education needs have been identified at this time  Skin:  Skin Assessment: Reviewed RN Assessment (abd & R groin surgical incisions)  Last BM:  11/2 rectal tube  Height:   Ht Readings from Last 1 Encounters:  04/26/20 6\' 3"  (1.905 m)    Weight:   Wt Readings from Last 1 Encounters:  05/03/20 103 kg    Ideal Body Weight:  89.1 kg  BMI:  Body mass index is 28.38 kg/m.  Estimated Nutritional Needs:   Kcal:  2275 kcals  Protein:  120-145 g  Fluid:  >/= 2.2 L    Kerman Passey MS, RDN, LDN, CNSC Registered Dietitian III Clinical Nutrition RD Pager and On-Call Pager Number Located in [place]

## 2020-05-04 NOTE — Progress Notes (Signed)
eLink Physician-Brief Progress Note Patient Name: Carlos Olson DOB: [birth year] MRN: [id number]   Date of Service  05/04/2020  HPI/Events of Note  Patient with increased WOB and O2 requirement. Has gone from 8 L HFNC to 15L + NRB mask. Now on BiPAP. ABG = 7.15/73/77/25. May require intubation and ventilation.   eICU Interventions  Will ask ground team to evaluate the patient at bedside.      Intervention Category Major Interventions: Hypoxemia - evaluation and management  [name] 05/04/2020, 6:11 AM

## 2020-05-04 NOTE — Progress Notes (Addendum)
Arrive at bedside with patient RR in 30s, using accessory muscles, unresponsive to painful stimuli, ABG with retention. Lung sounds with rhonci bilaterally. Reintubated without issue, some BP drop controlled with phenylephrine push. CXR pending. Check ABG 30 mins or so.  Erskine Emery MD PCCM

## 2020-05-04 NOTE — Progress Notes (Signed)
ABG results given to the RN and Dr. Lynetta Mare. FiO2 decreased to 70%. RT will continue to monitor.

## 2020-05-04 NOTE — Progress Notes (Signed)
OT Cancellation Note  Patient Details Name: SHAUNN TACKITT MRN: [id number] DOB: 1940/07/06   Cancelled Treatment:    Reason Eval/Treat Not Completed: Medical issues which prohibited therapy. Pt with medical decline, now intubated and sedated, signing off and will await new order.  Malka So 05/04/2020, 4:39 PM  Nestor Lewandowsky, OTR/L Acute Rehabilitation Services Pager: [phone] Office: [phone]

## 2020-05-04 NOTE — Progress Notes (Signed)
Progress Note  Patient Name: Carlos Olson Date of Encounter: 05/04/2020  Primary Cardiologist: Werner Lean, MD   Subjective   79 M with Coronary Artery Calcifications, Thoracic Aortic Aneurysm and baseline creatinine 1.19 who presented for AAA 04/17/20 with prolonged course:  Called 04/18/20 for hypotension, through course developed A fib RVR s/p 04/26/20 DCCV to NSR with quick return to atrial fibrillation, ICU Delirium, Aspiration PNA, and AKI.  Uptriaged 05/02/20 for hypercapnia, AMS, and hypotension. With diuresis, prior hypernatremia returned.  Re-intubated 05/04/20.  Worsening Tachypnea and AMS in AM; re-intubated.  With cessation of diuresis creatinine has improved.    Inpatient Medications    Scheduled Meds: . acetaminophen  650 mg Oral Q6H   Or  . acetaminophen  325-650 mg Rectal Q6H  . amiodarone  200 mg Per Tube BID  . apixaban  2.5 mg Per Tube BID  . atorvastatin  10 mg Per Tube QHS  . chlorhexidine  15 mL Mouth Rinse BID  . Chlorhexidine Gluconate Cloth  6 each Topical Daily  . darbepoetin (ARANESP) injection - NON-DIALYSIS  100 mcg Subcutaneous Q Mon-1800  . docusate  100 mg Per Tube BID  . feeding supplement (PROSource TF)  45 mL Per Tube BID  . fluticasone furoate-vilanterol  1 puff Inhalation Daily  . free water  250 mL Per Tube Q4H  . lactulose  30 g Per Tube BID  . lidocaine  2 patch Transdermal Q24H  . mouth rinse  15 mL Mouth Rinse q12n4p  . metoprolol tartrate  62.5 mg Per Tube BID  . pantoprazole sodium  40 mg Per Tube Daily  . polyethylene glycol  17 g Per Tube Daily  . potassium citrate  20 mEq Oral BID  . umeclidinium bromide  1 puff Inhalation Daily   Continuous Infusions: . sodium chloride Stopped (05/03/20 0058)  . dexmedetomidine (PRECEDEX) IV infusion 0.4 mcg/kg/hr (05/04/20 0654)  . feeding supplement (VITAL 1.5 CAL) Stopped (05/04/20 0500)  . piperacillin-tazobactam (ZOSYN)  IV 3.375 g (05/04/20 0559)   PRN Meds: alum & mag  hydroxide-simeth, bisacodyl, Gerhardt's butt cream, naLOXone (NARCAN)  injection, ondansetron, oxymetazoline   Vital Signs    Vitals:   05/04/20 0631 05/04/20 0635 05/04/20 0640 05/04/20 0804  BP:  105/62 101/61   Pulse: 76 78 75   Resp: (!) 26 (!) 26 (!) 26   Temp:    99.9 F (37.7 C)  TempSrc:    Oral  SpO2: 97% 97% 97%   Weight:      Height:        Intake/Output Summary (Last 24 hours) at 05/04/2020 0843 Last data filed at 05/04/2020 0700 Gross per 24 hour  Intake 1405.15 ml  Output 1850 ml  Net -444.85 ml   Filed Weights   05/02/20 0500 05/03/20 0500  Weight: 101 kg 103 kg    Telemetry    Sinus rhythm - Personally Reviewed  ECG    No new - Personally Reviewed  Physical Exam   GEN: Intubated and sedated Neck: No JVD near supine Cardiac: RRR, no murmurs, rubs, or gallops.  Respiratory: Coarse breath sounds bilaterally GI: Soft, nontender, non-distended  MS: +2 edema, Stable to improve R femoral hematoma Neuro:  Nonfocal  Psych: Normal affect   Labs    Chemistry Recent Labs  Lab 05/01/20 0914 05/01/20 1445 05/03/20 0610 05/03/20 0610 05/03/20 1618 05/03/20 1618 05/04/20 0049 05/04/20 0544 05/04/20 0833  NA 140   < > 147*   < > 147*   < >  147* 148* 149*  K 3.4*   < > 3.6   < > 3.9   < > 3.8 4.3 4.5  CL 110   < > 116*  --  114*  --  115*  --   --   CO2 18*   < > 21*  --  23  --  22  --   --   GLUCOSE 186*   < > 134*  --  133*  --  147*  --   --   BUN 35*   < > 33*  --  31*  --  29*  --   --   CREATININE 2.50*   < > 2.49*  --  2.23*  --  2.19*  --   --   CALCIUM 7.9*   < > 8.2*  --  8.4*  --  8.4*  --   --   PROT 5.2*  --   --   --   --   --   --   --   --   ALBUMIN 1.7*   < > 2.5*  --  2.7*  --  2.4*  --   --   AST 21  --   --   --   --   --   --   --   --   ALT 18  --   --   --   --   --   --   --   --   ALKPHOS 96  --   --   --   --   --   --   --   --   BILITOT 0.5  --   --   --   --   --   --   --   --   GFRNONAA 25*   < > 25*  --  29*  --   30*  --   --   ANIONGAP 12   < > 10  --  10  --  10  --   --    < > = values in this interval not displayed.    Hematology Recent Labs  Lab 05/02/20 0601 05/02/20 0601 05/03/20 0610 05/03/20 0610 05/04/20 0049 05/04/20 0544 05/04/20 0833  WBC 7.9  --  10.0  --  11.4*  --   --   RBC 2.82*  --  2.88*  --  2.94*  --   --   HGB 9.0*   < > 9.2*   < > 9.4* 9.9* 9.9*  HCT 28.8*   < > 29.5*   < > 30.8* 29.0* 29.0*  MCV 102.1*  --  102.4*  --  104.8*  --   --   MCH 31.9  --  31.9  --  32.0  --   --   MCHC 31.3  --  31.2  --  30.5  --   --   RDW 17.1*  --  17.2*  --  17.1*  --   --   PLT 397  --  421*  --  455*  --   --    < > = values in this interval not displayed.   Cardiac EnzymesNo results for input(s): TROPONINI in the last 168 hours. No results for input(s): TROPIPOC in the last 168 hours.   BNP Recent Labs  Lab 05/01/20 0914  BNP 461.1*     DDimer No results for input(s): DDIMER in the last 168 hours.  Radiology    DG CHEST PORT 1 VIEW  Result Date: 05/04/2020 CLINICAL DATA:  Intubation. EXAM: PORTABLE CHEST 1 VIEW COMPARISON:  05/03/2020. FINDINGS: Endotracheal tube noted with tip 6 cm above the carina. Feeding tube noted with tip below left hemidiaphragm. Heart size normal. Diffuse severe bilateral pulmonary infiltrates/edema again noted without interim change. Small bilateral pleural effusions, right side greater than left noted on today's exam. No pneumothorax. IMPRESSION: 1. Endotracheal tube and feeding tube in good anatomic position. 2. Diffuse severe bilateral pulmonary infiltrates/edema again noted without interim change. Small bilateral pleural effusions, right side greater than left noted on today's exam. Electronically Signed   By: Marcello Moores  Register   On: 05/04/2020 06:43   DG CHEST PORT 1 VIEW  Result Date: 05/03/2020 CLINICAL DATA:  Hypoxia EXAM: PORTABLE CHEST 1 VIEW COMPARISON:  Chest radiograph dated 05/01/2020. FINDINGS: The cardiac silhouette is unchanged.  Severe diffuse bilateral interstitial and airspace opacities appear slightly increased on the left and unchanged on the right. A small left pleural effusion may contribute. There is no right pleural effusion. There is no pneumothorax. An enteric tube enters the stomach and terminates below the field of view. Degenerative changes are seen in the spine. IMPRESSION: Severe diffuse bilateral interstitial and airspace opacities, slightly increased on the left and unchanged on the right. A small left pleural effusion may contribute. Electronically Signed   By: Zerita Boers M.D.   On: 05/03/2020 10:18    Cardiac Studies   None new  Patient Profile     79 y.o. male s/p AAA repair with complicated postoperative course with AKI, volume overload, and post operative AF  Assessment & Plan    Post operative Afib with RVR HTN Blood loss anemia Thoracic and Aortic Aneurysm - s/p DCCV 04/26/20 with conversion to NSR, but back to Afib RVR shortly thereafter - converted to SR 05/02/20 - echo with EF 50% with moderate LAE and mild RAE - Will transition back to Amiodarone 200 mg BID - continue eliquis 2.5 mg BID --> will need uninterrupted AC for 4 weeks from 10/28/21s/p DCCV - DC of lopressor presently   AKI Aspiration PNA  Hypoxic and hypercapneic respiratory failure Malnutrition with albumin < 2 Hypernatremia - per nephrology and primary; pharmacy assisting with dosing based on kidney - patient has total body volume overload, hypernatremia, hypoalbuminemia.  Reasonable to pause diuresis; though will eventually need volume off; if unable to wean off with PNA therapies; could re-trial albumin and Lasix 80 IV BID  For questions or updates, please contact [place] Please consult [email or web address] for contact info under Cardiology/STEMI.      Signed, Werner Lean, MD  05/04/2020, 8:43 AM

## 2020-05-04 NOTE — Progress Notes (Signed)
PT Cancellation Note  Patient Details Name: Carlos Olson MRN: [id number] DOB: Dec 19, 1940   Cancelled Treatment:     pt reintubated on vent and sedated, not medically stable for PT treatment   Lyanne Co, DPT Acute Rehabilitation Services [phone]   [place] 05/04/2020, 11:15 AM

## 2020-05-04 NOTE — Progress Notes (Addendum)
Progress Note    05/04/2020 8:07 AM 8 Days Post-Op  Subjective:  Re-intubated this morning   Vitals:   05/04/20 0640 05/04/20 0804  BP: 101/61   Pulse: 75   Resp: (!) 26   Temp:  99.9 F (37.7 C)  SpO2: 97%    Physical Exam: Cardiac:  RRR Lungs:  Mechanical ventilation Incisions:  abd and R groin incision c/d/i Extremities:  Dusky L GT however brisk PTA signals bilaterally Abdomen:  Soft, not distended Neurologic: sedated  CBC    Component Value Date/Time   WBC 11.4 (H) 05/04/2020 0049   RBC 2.94 (L) 05/04/2020 0049   HGB 9.9 (L) 05/04/2020 0544   HGB 12.1 (L) 08/20/2012 0610   HCT 29.0 (L) 05/04/2020 0544   HCT 44.0 08/02/2012 0853   PLT 455 (H) 05/04/2020 0049   PLT 158 08/20/2012 0610   MCV 104.8 (H) 05/04/2020 0049   MCV 99 08/02/2012 0853   MCH 32.0 05/04/2020 0049   MCHC 30.5 05/04/2020 0049   RDW 17.1 (H) 05/04/2020 0049   RDW 13.7 08/02/2012 0853   LYMPHSABS 0.4 (L) 05/01/2020 0914   MONOABS 0.6 05/01/2020 0914   EOSABS 0.0 05/01/2020 0914   BASOSABS 0.0 05/01/2020 0914    BMET    Component Value Date/Time   NA 148 (H) 05/04/2020 0544   NA 136 08/20/2012 0610   K 4.3 05/04/2020 0544   K 4.2 08/20/2012 0610   CL 115 (H) 05/04/2020 0049   CL 102 08/20/2012 0610   CO2 22 05/04/2020 0049   CO2 26 08/20/2012 0610   GLUCOSE 147 (H) 05/04/2020 0049   GLUCOSE 123 (H) 08/20/2012 0610   BUN 29 (H) 05/04/2020 0049   BUN 7 08/20/2012 0610   CREATININE 2.19 (H) 05/04/2020 0049   CREATININE 0.75 08/20/2012 0610   CALCIUM 8.4 (L) 05/04/2020 0049   CALCIUM 8.4 (L) 08/20/2012 0610   GFRNONAA 30 (L) 05/04/2020 0049   GFRNONAA >60 08/20/2012 0610   GFRAA >60 05/07/2017 0949   GFRAA >60 08/20/2012 0610    INR    Component Value Date/Time   INR 1.5 (H) 04/26/2020 1020   INR 1.0 08/02/2012 0853     Intake/Output Summary (Last 24 hours) at 05/04/2020 0807 Last data filed at 05/04/2020 0700 Gross per 24 hour  Intake 1405.15 ml  Output 1850 ml   Net -444.85 ml     Assessment/Plan:  79 y.o. 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 amio; Cardiology following Renal: kidney function stable per Nephrology Lungs: unfortunately re-intubated this morning; CCM assistance appreciated Patient's daughter updated at bedside    Dagoberto Ligas, PA-C [facility] [phone] 05/04/2020 8:07 AM   I have independently interviewed and examine patient and agree with PA assessment and plan above. Aortobifemoral bypass incisions healing well bilateral feet are warm with strong signals. Overnight was reintubated and pulmonary issues are his biggest challenge at this time. He was also having significant issues with delirium prior to reintubation. I had a long discussion with the daughter at the bedside as well as the Dr. Lynetta Mare with critical care. Likely require mechanical ventilation throughout the weekend will  consider weaning beginning tomorrow and will also consider early tracheostomy to help benefit his pulmonary and mental status. Appreciate the care of this difficult patient by all consulting services.  Dennys Guin C. Donzetta Matters, MD [facility] [place] Office: [phone] Pager: [phone]

## 2020-05-05 DIAGNOSIS — D500 Iron deficiency anemia secondary to blood loss (chronic): Secondary | ICD-10-CM

## 2020-05-05 DIAGNOSIS — N170 Acute kidney failure with tubular necrosis: Secondary | ICD-10-CM | POA: Diagnosis not present

## 2020-05-05 DIAGNOSIS — I480 Paroxysmal atrial fibrillation: Secondary | ICD-10-CM | POA: Diagnosis not present

## 2020-05-05 DIAGNOSIS — N171 Acute kidney failure with acute cortical necrosis: Secondary | ICD-10-CM | POA: Diagnosis not present

## 2020-05-05 LAB — FUNGITELL, SERUM: Fungitell Result: <31 pg/mL (ref <80)

## 2020-05-05 LAB — RENAL FUNCTION PANEL
Albumin: 1.9 g/dL — ABNORMAL LOW (ref 3.5–5.0)
Albumin: 2.1 g/dL — ABNORMAL LOW (ref 3.5–5.0)
Anion gap: 8 (ref 5–15)
Anion gap: 8 (ref 5–15)
BUN: 44 mg/dL — ABNORMAL HIGH (ref 8–23)
BUN: 44 mg/dL — ABNORMAL HIGH (ref 8–23)
CO2: 22 mmol/L (ref 22–32)
CO2: 21 mmol/L — ABNORMAL LOW (ref 22–32)
Calcium: 8 mg/dL — ABNORMAL LOW (ref 8.9–10.3)
Calcium: 8.1 mg/dL — ABNORMAL LOW (ref 8.9–10.3)
Chloride: 116 mmol/L — ABNORMAL HIGH (ref 98–111)
Chloride: 116 mmol/L — ABNORMAL HIGH (ref 98–111)
Creatinine, Ser: 2.51 mg/dL — ABNORMAL HIGH (ref 0.61–1.24)
Creatinine, Ser: 2.28 mg/dL — ABNORMAL HIGH (ref 0.61–1.24)
GFR, Estimated: 25 mL/min — ABNORMAL LOW (ref >60)
GFR, Estimated: 28 mL/min — ABNORMAL LOW (ref >60)
Glucose, Bld: 115 mg/dL — ABNORMAL HIGH (ref 70–99)
Glucose, Bld: 123 mg/dL — ABNORMAL HIGH (ref 70–99)
Phosphorus: 3.4 mg/dL (ref 2.5–4.6)
Phosphorus: 3.5 mg/dL (ref 2.5–4.6)
Potassium: 4.1 mmol/L (ref 3.5–5.1)
Potassium: 3.9 mmol/L (ref 3.5–5.1)
Sodium: 146 mmol/L — ABNORMAL HIGH (ref 135–145)
Sodium: 145 mmol/L (ref 135–145)

## 2020-05-05 LAB — GLUCOSE, CAPILLARY
Glucose-Capillary: 128 mg/dL — ABNORMAL HIGH (ref 70–99)
Glucose-Capillary: 118 mg/dL — ABNORMAL HIGH (ref 70–99)
Glucose-Capillary: 114 mg/dL — ABNORMAL HIGH (ref 70–99)
Glucose-Capillary: 121 mg/dL — ABNORMAL HIGH (ref 70–99)
Glucose-Capillary: 105 mg/dL — ABNORMAL HIGH (ref 70–99)
Glucose-Capillary: 128 mg/dL — ABNORMAL HIGH (ref 70–99)

## 2020-05-05 LAB — CBC WITH DIFFERENTIAL/PLATELET
Abs Immature Granulocytes: 0.11 10*3/uL — ABNORMAL HIGH (ref 0.00–0.07)
Basophils Absolute: 0 10*3/uL (ref 0.0–0.1)
Basophils Relative: 1 %
Eosinophils Absolute: 0.3 10*3/uL (ref 0.0–0.5)
Eosinophils Relative: 3 %
HCT: 26.6 % — ABNORMAL LOW (ref 39.0–52.0)
Hemoglobin: 8 g/dL — ABNORMAL LOW (ref 13.0–17.0)
Immature Granulocytes: 1 %
Lymphocytes Relative: 6 %
Lymphs Abs: 0.6 10*3/uL — ABNORMAL LOW (ref 0.7–4.0)
MCH: 31.6 pg (ref 26.0–34.0)
MCHC: 30.1 g/dL (ref 30.0–36.0)
MCV: 105.1 fL — ABNORMAL HIGH (ref 80.0–100.0)
Monocytes Absolute: 0.6 10*3/uL (ref 0.1–1.0)
Monocytes Relative: 7 %
Neutro Abs: 7.3 10*3/uL (ref 1.7–7.7)
Neutrophils Relative %: 82 %
Platelets: 304 10*3/uL (ref 150–400)
RBC: 2.53 MIL/uL — ABNORMAL LOW (ref 4.22–5.81)
RDW: 17 % — ABNORMAL HIGH (ref 11.5–15.5)
WBC: 8.9 10*3/uL (ref 4.0–10.5)
nRBC: 0.2 % (ref 0.0–0.2)

## 2020-05-05 LAB — MAGNESIUM: Magnesium: 2 mg/dL (ref 1.7–2.4)

## 2020-05-05 MED ORDER — LACTATED RINGERS IV SOLN: INTRAVENOUS | Status: DC

## 2020-05-05 MED ORDER — ACETYLCYSTEINE 20 % IN SOLN
3.0000 mL | Freq: Three times a day (TID) | RESPIRATORY_TRACT | Status: DC
  Administered 2020-05-05 – 2020-05-07 (×8): 3 mL via RESPIRATORY_TRACT
  Filled 2020-05-05 (×10): qty 4

## 2020-05-05 MED ORDER — ALBUMIN HUMAN 25 % IV SOLN
25.0000 g | Freq: Once | INTRAVENOUS | Status: AC
  Administered 2020-05-05: 25 g via INTRAVENOUS
  Filled 2020-05-05: qty 100

## 2020-05-05 MED ORDER — CHLORHEXIDINE GLUCONATE 0.12% ORAL RINSE (MEDLINE KIT)
15.0000 mL | Freq: Two times a day (BID) | OROMUCOSAL | Status: DC
  Administered 2020-05-05 – 2020-05-07 (×4): 15 mL via OROMUCOSAL

## 2020-05-05 MED ORDER — IPRATROPIUM-ALBUTEROL 0.5-2.5 (3) MG/3ML IN SOLN
3.0000 mL | Freq: Three times a day (TID) | RESPIRATORY_TRACT | Status: DC
  Administered 2020-05-05 – 2020-05-15 (×33): 3 mL via RESPIRATORY_TRACT
  Filled 2020-05-05 (×14): qty 3
  Filled 2020-05-05: qty 30
  Filled 2020-05-05 (×20): qty 3

## 2020-05-05 MED ORDER — ORAL CARE MOUTH RINSE
15.0000 mL | OROMUCOSAL | Status: DC
  Administered 2020-05-05 – 2020-05-07 (×21): 15 mL via OROMUCOSAL

## 2020-05-05 NOTE — Progress Notes (Signed)
[facility] Progress Note    Assessment/ Plan:   AKI: Likely secondary to acute tubular injury in the context of hypotension, worsening anemia.  PVL unable to assess left renal artery but does appear to have flow in the parenchyma.  Right renal artery is okay.  C3 not decreased.. BL normal renal function.  Kidney function has remained relatively stable despite his ongoing acute illness.   -Agree with cardiology that diuretics may need to be restarted in the near future to help with ventilatory status if methods of airway clearance do not improve oxygenation -Monitor closely for dialysis needs.  Urine output has been low and hypotension likely causing ongoing renal ischemia and ATN -25 g of 25% albumin today given serum albumin is 1.9 -Could consider diuretics for hypoxia as below -Possible left renal artery compromise given it was not visualized on PVL but cannot say for sure  Hypernatremia: Sodium 146 today.  Continue to titrate free water as needed  Hypoxia: Likely multifactorial with history pneumonia and poor airway clearance contributing.  Pulmonary edema could be contributing factor as well.  Consider reinstituting diuretics if hypoxia fails to improve..    Metabolic acidosis/Hyperchloremia/Hypokalemia: multifactorial w/ AKI and diarrhea contributing -Had to stop potassium citrate as it cannot be administered through NG tube -Replace potassium and bicarbonate as needed -Serum bicarb level is 22 today which is acceptable  H/o HTN:  Blood pressure variable related to atrial fibrillation and sedation.  Can titrate meds as needed.  Anemia -Transfuse for Hgb<7 g/dL-transfused on 10/30.  On Aranesp 100 mcg weekly  A. fib.    Intermittent issue related to multiple factors.  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  Does have some degree of hypotension.  Urine output has been minimal.   Objective:   BP (!) 92/50   Pulse (!) 44   Temp 98.6 F (37 C) (Axillary)   Resp (!) 22   Ht 6\' 3"  (1.905 m)   Wt 102.2 kg   SpO2 (!) 83%   BMI 28.16 kg/m   Intake/Output Summary (Last 24 hours) at 05/05/2020 0913 Last data filed at 05/05/2020 0700 Gross per 24 hour  Intake 1228.93 ml  Output 95 ml  Net 1133.93 ml   Weight change:   Physical Exam: Gen: In bed, resting comfortably HEENT: dry mucosal membranes, ET tube in place CVS: Normal rate, no audible murmur Resp: Coarse bilateral breath sounds,  bilateral chest rise [id number], nondistended Ext: pitting edema in all 4 extremities Neuro: Sedated, does not respond to questioning  Imaging: DG CHEST PORT 1 VIEW  Result Date: 05/04/2020 CLINICAL DATA:  Intubation. EXAM: PORTABLE CHEST 1 VIEW COMPARISON:  05/03/2020. FINDINGS: Endotracheal tube noted with tip 6 cm above the carina. Feeding tube noted with tip below left hemidiaphragm. Heart size normal. Diffuse severe bilateral pulmonary infiltrates/edema again noted without interim change. Small bilateral pleural effusions, right side greater than left noted on today's exam. No pneumothorax. IMPRESSION: 1. Endotracheal tube and feeding tube in good anatomic position. 2. Diffuse severe bilateral pulmonary infiltrates/edema again noted without interim change. Small bilateral pleural effusions, right side greater than left noted on today's exam. Electronically Signed   By: Marcello Moores  Register   On: 05/04/2020 06:43  DG CHEST PORT 1 VIEW  Result Date: 05/03/2020 CLINICAL DATA:  Hypoxia EXAM: PORTABLE CHEST 1 VIEW COMPARISON:  Chest radiograph dated 05/01/2020. FINDINGS: The cardiac silhouette is unchanged. Severe  diffuse bilateral interstitial and airspace opacities appear slightly increased on the left and unchanged on the right. A small left pleural effusion may contribute. There is no right pleural effusion. There is no pneumothorax. An enteric tube enters the stomach and terminates below the field of view. Degenerative changes are seen in the spine. IMPRESSION: Severe diffuse bilateral interstitial and airspace opacities, slightly increased on the left and unchanged on the right. A small left pleural effusion may contribute. Electronically Signed   By: Zerita Boers M.D.   On: 05/03/2020 10:18    Labs: BMET Recent Labs  Lab 05/02/20 0601 05/02/20 0601 05/02/20 1534 05/02/20 1534 05/03/20 0610 05/03/20 1618 05/04/20 0049 05/04/20 0544 05/04/20 0833 05/04/20 1613 05/05/20 0203  NA 146*   < > 146*   < > 147* 147* 147* 148* 149* 145 146*  K 3.4*   < > 3.6   < > 3.6 3.9 3.8 4.3 4.5 4.0 4.1  CL 113*  --  115*  --  116* 114* 115*  --   --  114* 116*  CO2 22  --  21*  --  21* 23 22  --   --  20* 22  GLUCOSE 128*  --  108*  --  134* 133* 147*  --   --  132* 115*  BUN 36*  --  38*  --  33* 31* 29*  --   --  37* 44*  CREATININE 2.73*  --  2.68*  --  2.49* 2.23* 2.19*  --   --  2.34* 2.51*  CALCIUM 8.1*  --  8.3*  --  8.2* 8.4* 8.4*  --   --  7.9* 8.0*  PHOS 4.4  --  3.4  --  3.4  3.5 3.6 3.7  3.7  --   --  3.2 3.4   < > = values in this interval not displayed.   CBC Recent Labs  Lab 05/01/20 0914 05/01/20 1445 05/02/20 0601 05/02/20 0601 05/03/20 0610 05/03/20 0610 05/04/20 0049 05/04/20 0544 05/04/20 0833 05/05/20 0203  WBC 14.0*  --  7.9  --  10.0  --  11.4*  --   --  8.9  NEUTROABS 12.7*  --   --   --   --   --   --   --   --  7.3  HGB 9.5*   < > 9.0*   < > 9.2*   < > 9.4* 9.9* 9.9* 8.0*  HCT 31.1*   < > 28.8*   < > 29.5*   < > 30.8* 29.0* 29.0* 26.6*  MCV 102.6*  --  102.1*  --  102.4*  --  104.8*  --   --  105.1*  PLT 416*  --  397  --  421*  --  455*  --   --  304   < > =  values in this interval not displayed.    Medications:    . acetaminophen  650 mg Per Tube Q6H   Or  . acetaminophen  325-650 mg Rectal Q6H  . acetylcysteine  3 mL Nebulization TID  . amiodarone  200 mg Per Tube BID  . apixaban  2.5 mg Per Tube BID  . atorvastatin  10 mg Per Tube QHS  . chlorhexidine  15 mL Mouth Rinse  BID  . Chlorhexidine Gluconate Cloth  6 each Topical Daily  . darbepoetin (ARANESP) injection - NON-DIALYSIS  100 mcg Subcutaneous Q Mon-1800  . feeding supplement (PROSource TF)  45 mL Per Tube TID  . fluticasone furoate-vilanterol  1 puff Inhalation Daily  . free water  300 mL Per Tube Q4H  . ipratropium-albuterol  3 mL Nebulization TID  . lactulose  30 g Per Tube BID  . lidocaine  2 patch Transdermal Q24H  . mouth rinse  15 mL Mouth Rinse q12n4p  . pantoprazole sodium  40 mg Per Tube Daily  . umeclidinium bromide  1 puff Inhalation Daily      [facility] 05/05/2020, 9:13 AM

## 2020-05-05 NOTE — Progress Notes (Signed)
NAME:  Carlos Olson, MRN:  [id number], DOB:  1940/08/29, LOS: 19 ADMISSION DATE:  04/17/2020, CONSULTATION DATE: 10/21 REFERRING MD: Dr. Donzetta Matters, CHIEF COMPLAINT: AKI with mild hypoxia  Brief History   79 year old male who presented 10/18 for elective endovascular repair abdominal aortic aneurysm with Dr. Donzetta Matters. PCCM consulted 10/21 for assistance in medical management.  Hospital course complicated by AKI, metabolic encephalopathy, hypoxia/ RLL pneumonia, and hypotension. Treated in ICU for sepsis, AFRVR, underwent cardioversion, ABX course. Moved out of ICU. Ongoing course significant for AKI, AF, debility. Then 11/2 he became encephalopathic overnight and was transferred back to ICU. PCCM re-consulted.   Past Medical History  AAA History of prostate cancer Heart murmur Diverticulitis COPD CAD atherosclerosis  Significant Hospital Events   Admitted 10/18 for elective AAA repair 10/19 OR   Consults:  Cardiology PCCM Nephrology GI  Procedures:  Endovascular AAA repair 10/19  Significant Diagnostic Tests:  04/21/2020 CT A/P > Status post abdominal aortic repair with aortobifemoral bypass graft. Some stranding is noted in the operative site likely related to postoperative change. No sizable hematoma is seen. Diverticulosis without diverticulitis. Near complete consolidation of the right lower lobe. Small left pleural effusion is seen with left basilar atelectasis. 10/25 CTH > 1. Stable.  No acute intracranial abnormality. 2. Atrophy with chronic small vessel white matter ischemic disease.  Micro Data:  Blood 10/28 > negative Sputum 10/28 > few candida  Antimicrobials:  10/19 cefazolin 10/22 zosyn > 10/26 Vancomycin 10/29 > 11/2  Zosyn 10/29 > 11/3 Fluconazole 10/29 >>>  Interim history/subjective:  Re-intubated yesterday for increased work of breathing and somnolence. Now awake and following commands appropriately.   Objective   Blood pressure (!) 92/50, pulse (!) 44,  temperature 98.6 F (37 C), temperature source Axillary, resp. rate (!) 22, height 6\' 3"  (1.905 m), weight 102.2 kg, SpO2 98 %.    Vent Mode: PRVC FiO2 (%):  [50 %-70 %] 50 % Set Rate:  [26 bmp] 26 bmp Vt Set:  [630 mL] 630 mL PEEP:  [5 cmH20] 5 cmH20 Plateau Pressure:  [15 cmH20-18 cmH20] 18 cmH20   Intake/Output Summary (Last 24 hours) at 05/05/2020 0820 Last data filed at 05/05/2020 0700 Gross per 24 hour  Intake 1228.93 ml  Output 95 ml  Net 1133.93 ml   Filed Weights   05/02/20 0500 05/03/20 0500 05/05/20 0500  Weight: 101 kg 103 kg 102.2 kg   Examination: General: Elderly man lying in bed, mild dyspnea when dropped to PSV 5/5/   HEENT: Dry MM. Small bore feeding tube noted to right nare.  Neuro: Sleepy, awakens with physical stimulation, follows simple comamnds. CV: Regular rate and rhythm, no murmurs PULM: scattered rhonchi at lower lobes. Mild accessory muscle use. Vt of 675 on PSV 5/5  GI: Soft, nontender, nondistended.  Midline incision > clean, dry  Extremities: trace edema  Resolved Hospital Problem list     Assessment & Plan:   Acute hypercarbic respiratory failure due to combination of oversedation from narcotics and respiratory muscle fatigue Hypotension due to hypovolemia. 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  - Aggressive chest physiotherapy.  - Complete 7days of Zosyn for pneumonia, but poor secretion clearance may be more at play - Avoid all narcotics, but  continued delirium and debility despite no narcotics for >48h.  - Delirium appears to have cleared. Watch for CO2 narcosis once extubated. May benefit from nocturnal BiPAP once extubated until  respiratory muscle strength improves.  - Gentle hydration. Avoid vasopressors if possible as AKI has just stabilized.  - Continue amiodarone and apixiban enterally - Continue bronchodilators - Ensure enteral nutrition.   Best practice:  Diet: NPO/ EN via SBFT Pain/Anxiety/Delirium protocol (if indicated): Precedex to RASS -1. VAP protocol (if indicated):  DVT prophylaxis: Eliquis GI prophylaxis: PPI Glucose control: euglycemic on no coverage Mobility: push PT efforts Code Status: Full Family Communication: updated daughter at bedside with Dr Donzetta Matters. We agree that patient's condition is still recoverable, but he now has a substantial hill to climb to recovery due to delirium and deconditioning. Agreed that secure nutrition and early trach would provide best hope of recovery.  Disposition: ICU  Labs   CBC: Recent Labs  Lab 05/01/20 0914 05/01/20 1445 05/02/20 0601 05/02/20 0601 05/03/20 0610 05/04/20 0049 05/04/20 0544 05/04/20 0833 05/05/20 0203  WBC 14.0*  --  7.9  --  10.0 11.4*  --   --  8.9  NEUTROABS 12.7*  --   --   --   --   --   --   --  7.3  HGB 9.5*   < > 9.0*   < > 9.2* 9.4* 9.9* 9.9* 8.0*  HCT 31.1*   < > 28.8*   < > 29.5* 30.8* 29.0* 29.0* 26.6*  MCV 102.6*  --  102.1*  --  102.4* 104.8*  --   --  105.1*  PLT 416*  --  397  --  421* 455*  --   --  304   < > = values in this interval not displayed.    Basic Metabolic Panel: Recent Labs  Lab 05/01/20 1609 05/01/20 2145 05/02/20 0601 05/02/20 1534 05/03/20 0610 05/03/20 0610 05/03/20 1618 05/03/20 1618 05/04/20 0049 05/04/20 0544 05/04/20 0833 05/04/20 1613 05/05/20 0203  NA   < >  --  146*   < > 147*   < > 147*   < > 147* 148* 149* 145 146*  K   < >  --  3.4*   < > 3.6   < > 3.9   < > 3.8 4.3 4.5 4.0 4.1  CL   < >  --  113*   < > 116*  --  114*  --  115*  --   --  114* 116*  CO2   < >  --  22   < > 21*  --  23  --  22  --   --  20* 22  GLUCOSE   < >  --  128*   < > 134*  --  133*  --  147*  --   --   132* 115*  BUN   < >  --  36*   < > 33*  --  31*  --  29*  --   --  37* 44*  CREATININE   < >  --  2.73*   < > 2.49*  --  2.23*  --  2.19*  --   --  2.34* 2.51*  CALCIUM   < >  --  8.1*   < > 8.2*  --  8.4*  --  8.4*  --   --  7.9* 8.0*  MG  --  1.6* 2.1  --  1.9  --   --   --  2.2  --   --   --  2.0  PHOS   < >  --  4.4   < > 3.4  3.5  --  3.6  --  3.7  3.7  --   --  3.2 3.4   < > = values in this interval not displayed.   GFR: Estimated Creatinine Clearance: 30.4 mL/min (A) (by C-G formula based on SCr of 2.51 mg/dL (H)). Recent Labs  Lab 05/01/20 0914 05/01/20 0914 05/02/20 0601 05/03/20 0610 05/04/20 0049 05/05/20 0203  PROCALCITON 0.34  --  0.31 0.13  --   --   WBC 14.0*   < > 7.9 10.0 11.4* 8.9  LATICACIDVEN 1.3  --   --   --   --   --    < > = values in this interval not displayed.    Liver Function Tests: Recent Labs  Lab 05/01/20 0914 05/01/20 1609 05/03/20 0610 05/03/20 1618 05/04/20 0049 05/04/20 1613 05/05/20 0203  AST 21  --   --   --   --   --   --   ALT 18  --   --   --   --   --   --   ALKPHOS 96  --   --   --   --   --   --   BILITOT 0.5  --   --   --   --   --   --   PROT 5.2*  --   --   --   --   --   --   ALBUMIN 1.7*   < > 2.5* 2.7* 2.4* 2.0* 1.9*   < > = values in this interval not displayed.   No results for input(s): LIPASE, AMYLASE in the last 168 hours. Recent Labs  Lab 05/01/20 0914 05/03/20 1618  AMMONIA 66* 22    ABG    Component Value Date/Time   PHART 7.245 (L) 05/04/2020 0833   PCO2ART 55.8 (H) 05/04/2020 0833   PO2ART 155 (H) 05/04/2020 0833   HCO3 24.2 05/04/2020 0833   TCO2 26 05/04/2020 0833   ACIDBASEDEF 3.0 (H) 05/04/2020 0833   O2SAT 99.0 05/04/2020 0833     Coagulation Profile: No results for input(s): INR, PROTIME in the last 168 hours.  Cardiac Enzymes: No results for input(s): CKTOTAL, CKMB, CKMBINDEX, TROPONINI in the last 168 hours.  HbA1C: No results found for: HGBA1C  CBG: Recent Labs  Lab  05/04/20 1540 05/04/20 1935 05/04/20 2331 05/05/20 0355 05/05/20 0809  GLUCAP 123* 129* 106* 128* 118*   CRITICAL CARE Performed by: Kipp Brood   Total critical care time: 40 minutes  Critical care time was exclusive of separately billable procedures and treating other patients.  Critical care was necessary to treat or prevent imminent or life-threatening deterioration.  Critical care was time spent personally by me on the following activities: development of treatment plan with patient and/or surrogate as well as nursing, discussions with consultants, evaluation of patient's response to treatment, examination of patient, obtaining history from patient or surrogate, ordering and performing treatments and interventions, ordering and review of laboratory studies, ordering and review of radiographic studies, pulse oximetry, re-evaluation of patient's condition and participation in multidisciplinary rounds.  Kipp Brood, MD [facility] ICU Physician [place]  Pager: [phone] Mobile: [phone] After hours: [phone].

## 2020-05-05 NOTE — Progress Notes (Signed)
eLink Physician-Brief Progress Note Patient Name: Carlos Olson DOB: Aug 30, 1940 MRN: [id number]   Date of Service  05/05/2020  HPI/Events of Note  Patient with anxiety on current Precedex dose of 1.2  Mcg.  eICU Interventions  Will increase Precedex ceiling to 1.5 mcg., I also asked the bedside RN to switch iv's to r/o the possibility that the current infusion iv is not good.        Kerry Kass Iosefa Weintraub 05/05/2020, 11:33 PM

## 2020-05-05 NOTE — Progress Notes (Addendum)
Progress Note  Patient Name: Carlos Olson Date of Encounter: 05/05/2020  Primary Cardiologist: Werner Lean, MD   Subjective   79 M with Coronary Artery Calcifications, Thoracic Aortic Aneurysm and baseline creatinine 1.19 who presented for AAA 04/17/20 with prolonged course:  Called 04/18/20 for hypotension, through course developed A fib RVR s/p 04/26/20 DCCV to NSR with quick return to atrial fibrillation, ICU Delirium, Aspiration PNA, and AKI.  Uptriaged 05/02/20 for hypercapnia, AMS, and hypotension. With diuresis, prior hypernatremia returned.  Re-intubated 05/04/20.  Still intubated, alert and oriented, nodes and waves.    Inpatient Medications    Scheduled Meds: . acetaminophen  650 mg Per Tube Q6H   Or  . acetaminophen  325-650 mg Rectal Q6H  . amiodarone  200 mg Per Tube BID  . apixaban  2.5 mg Per Tube BID  . atorvastatin  10 mg Per Tube QHS  . chlorhexidine  15 mL Mouth Rinse BID  . Chlorhexidine Gluconate Cloth  6 each Topical Daily  . darbepoetin (ARANESP) injection - NON-DIALYSIS  100 mcg Subcutaneous Q Mon-1800  . feeding supplement (PROSource TF)  45 mL Per Tube TID  . fluticasone furoate-vilanterol  1 puff Inhalation Daily  . free water  300 mL Per Tube Q4H  . lactulose  30 g Per Tube BID  . lidocaine  2 patch Transdermal Q24H  . mouth rinse  15 mL Mouth Rinse q12n4p  . pantoprazole sodium  40 mg Per Tube Daily  . umeclidinium bromide  1 puff Inhalation Daily   Continuous Infusions: . sodium chloride Stopped (05/03/20 0058)  . dexmedetomidine (PRECEDEX) IV infusion 0.6 mcg/kg/hr (05/05/20 0733)  . feeding supplement (VITAL 1.5 CAL) Stopped (05/04/20 0500)  . lactated ringers     PRN Meds: alum & mag hydroxide-simeth, bisacodyl, Gerhardt's butt cream, naLOXone (NARCAN)  injection, ondansetron, oxymetazoline   Vital Signs    Vitals:   05/05/20 0715 05/05/20 0730 05/05/20 0745 05/05/20 0819  BP: (!) 99/49 (!) 97/50 (!) 92/50   Pulse: (!) 43  (!) 45 (!) 44   Resp: (!) 23 (!) 25 (!) 22   Temp:   98.6 F (37 C)   TempSrc:   Axillary   SpO2: 99% 99% 98% 99%  Weight:      Height:        Intake/Output Summary (Last 24 hours) at 05/05/2020 0830 Last data filed at 05/05/2020 0700 Gross per 24 hour  Intake 1228.93 ml  Output 95 ml  Net 1133.93 ml   Filed Weights   05/02/20 0500 05/03/20 0500 05/05/20 0500  Weight: 101 kg 103 kg 102.2 kg    Telemetry    Sinus bradycardia in 40-50', frequent PACs - Personally Reviewed  ECG    No new - Personally Reviewed  Physical Exam   GEN: Intubated, Alert, responds appropriately Neck: No JVD near supine Cardiac: RRR, no murmurs, rubs, or gallops.  Respiratory: crackles bilaterally GI: Soft, nontender, non-distended  MS: +1 edema, Stable to improve R femoral hematoma Neuro:  Nonfocal  Psych: Normal affect   Labs    Chemistry Recent Labs  Lab 05/01/20 0914 05/01/20 1445 05/04/20 0049 05/04/20 0544 05/04/20 0833 05/04/20 1613 05/05/20 0203  NA 140   < > 147*   < > 149* 145 146*  K 3.4*   < > 3.8   < > 4.5 4.0 4.1  CL 110   < > 115*  --   --  114* 116*  CO2 18*   < >  22  --   --  20* 22  GLUCOSE 186*   < > 147*  --   --  132* 115*  BUN 35*   < > 29*  --   --  37* 44*  CREATININE 2.50*   < > 2.19*  --   --  2.34* 2.51*  CALCIUM 7.9*   < > 8.4*  --   --  7.9* 8.0*  PROT 5.2*  --   --   --   --   --   --   ALBUMIN 1.7*   < > 2.4*  --   --  2.0* 1.9*  AST 21  --   --   --   --   --   --   ALT 18  --   --   --   --   --   --   ALKPHOS 96  --   --   --   --   --   --   BILITOT 0.5  --   --   --   --   --   --   GFRNONAA 25*   < > 30*  --   --  27* 25*  ANIONGAP 12   < > 10  --   --  11 8   < > = values in this interval not displayed.    Hematology Recent Labs  Lab 05/03/20 0610 05/03/20 0610 05/04/20 0049 05/04/20 0049 05/04/20 0544 05/04/20 0833 05/05/20 0203  WBC 10.0  --  11.4*  --   --   --  8.9  RBC 2.88*  --  2.94*  --   --   --  2.53*  HGB 9.2*   < >  9.4*   < > 9.9* 9.9* 8.0*  HCT 29.5*   < > 30.8*   < > 29.0* 29.0* 26.6*  MCV 102.4*  --  104.8*  --   --   --  105.1*  MCH 31.9  --  32.0  --   --   --  31.6  MCHC 31.2  --  30.5  --   --   --  30.1  RDW 17.2*  --  17.1*  --   --   --  17.0*  PLT 421*  --  455*  --   --   --  304   < > = values in this interval not displayed.   Cardiac EnzymesNo results for input(s): TROPONINI in the last 168 hours. No results for input(s): TROPIPOC in the last 168 hours.   BNP Recent Labs  Lab 05/01/20 0914  BNP 461.1*     DDimer No results for input(s): DDIMER in the last 168 hours.   Radiology    DG CHEST PORT 1 VIEW  Result Date: 05/04/2020 CLINICAL DATA:  Intubation. EXAM: PORTABLE CHEST 1 VIEW COMPARISON:  05/03/2020. FINDINGS: Endotracheal tube noted with tip 6 cm above the carina. Feeding tube noted with tip below left hemidiaphragm. Heart size normal. Diffuse severe bilateral pulmonary infiltrates/edema again noted without interim change. Small bilateral pleural effusions, right side greater than left noted on today's exam. No pneumothorax. IMPRESSION: 1. Endotracheal tube and feeding tube in good anatomic position. 2. Diffuse severe bilateral pulmonary infiltrates/edema again noted without interim change. Small bilateral pleural effusions, right side greater than left noted on today's exam. Electronically Signed   By: [place]   On: 05/04/2020 06:43   DG CHEST PORT 1 VIEW  Result Date: 05/03/2020 CLINICAL DATA:  Hypoxia EXAM: PORTABLE CHEST 1 VIEW COMPARISON:  Chest radiograph dated 05/01/2020. FINDINGS: The cardiac silhouette is unchanged. Severe diffuse bilateral interstitial and airspace opacities appear slightly increased on the left and unchanged on the right. A small left pleural effusion may contribute. There is no right pleural effusion. There is no pneumothorax. An enteric tube enters the stomach and terminates below the field of view. Degenerative changes are seen in the  spine. IMPRESSION: Severe diffuse bilateral interstitial and airspace opacities, slightly increased on the left and unchanged on the right. A small left pleural effusion may contribute. Electronically Signed   By: Zerita Boers M.D.   On: 05/03/2020 10:18    Cardiac Studies   None new  Patient Profile     79 y.o. male s/p AAA repair with complicated postoperative course with AKI, volume overload, and post operative AF  Assessment & Plan    Post operative Afib with RVR HTN  Blood loss anemia - Hb down today 9.9 -> 8.0, we will follow closely especially since on Eliquis  Thoracic and Aortic Aneurysm - s/p DCCV 04/26/20 with conversion to NSR, but back to Afib RVR shortly thereafter - converted to SR 05/02/20 - echo with EF 50% with moderate LAE and mild RAE - Remains in SR on now PO Amiodarone 200 mg BID - continue eliquis 2.5 mg BID --> will need uninterrupted AC for 4 weeks from 10/28/21s/p DCCV - DC of lopressor presently, hypotensive   AKI - crea remains elevated but stable 2.5  Aspiration PNA  Hypoxic and hypercapneic respiratory failure Malnutrition with albumin < 2  Hypernatremia - per nephrology and primary; pharmacy assisting with dosing based on kidney - patient has hypernatremia, hypoalbuminemia.  Reasonable to pause diuresis; though will eventually need volume off; if unable to wean off with PNA therapies; could re-trial albumin.  For questions or updates, please contact [place] Please consult [email or web address] for contact info under Cardiology/STEMI.     Signed, Ena Dawley, MD  05/05/2020, 8:30 AM

## 2020-05-05 NOTE — Progress Notes (Signed)
  Progress Note    05/05/2020 10:01 AM 9 Days Post-Op  Subjective: Patient is intubated more alert this morning  Vitals:   05/05/20 0945 05/05/20 0948  BP: (!) 90/47   Pulse: (!) 51   Resp: (!) 21   Temp:    SpO2: 95% 95%    Physical Exam: Intubated but alert able to respond with hand gestures Abdomen is soft and nontender midline incision healing well Right groin incision healing well Bilateral feet have stable edema are warm and well-perfused there is a palpable left posterior tibial pulse  CBC    Component Value Date/Time   WBC 8.9 05/05/2020 0203   RBC 2.53 (L) 05/05/2020 0203   HGB 8.0 (L) 05/05/2020 0203   HGB 12.1 (L) 08/20/2012 0610   HCT 26.6 (L) 05/05/2020 0203   HCT 44.0 08/02/2012 0853   PLT 304 05/05/2020 0203   PLT 158 08/20/2012 0610   MCV 105.1 (H) 05/05/2020 0203   MCV 99 08/02/2012 0853   MCH 31.6 05/05/2020 0203   MCHC 30.1 05/05/2020 0203   RDW 17.0 (H) 05/05/2020 0203   RDW 13.7 08/02/2012 0853   LYMPHSABS 0.6 (L) 05/05/2020 0203   MONOABS 0.6 05/05/2020 0203   EOSABS 0.3 05/05/2020 0203   BASOSABS 0.0 05/05/2020 0203    BMET    Component Value Date/Time   NA 146 (H) 05/05/2020 0203   NA 136 08/20/2012 0610   K 4.1 05/05/2020 0203   K 4.2 08/20/2012 0610   CL 116 (H) 05/05/2020 0203   CL 102 08/20/2012 0610   CO2 22 05/05/2020 0203   CO2 26 08/20/2012 0610   GLUCOSE 115 (H) 05/05/2020 0203   GLUCOSE 123 (H) 08/20/2012 0610   BUN 44 (H) 05/05/2020 0203   BUN 7 08/20/2012 0610   CREATININE 2.51 (H) 05/05/2020 0203   CREATININE 0.75 08/20/2012 0610   CALCIUM 8.0 (L) 05/05/2020 0203   CALCIUM 8.4 (L) 08/20/2012 0610   GFRNONAA 25 (L) 05/05/2020 0203   GFRNONAA >60 08/20/2012 0610   GFRAA >60 05/07/2017 0949   GFRAA >60 08/20/2012 0610    INR    Component Value Date/Time   INR 1.5 (H) 04/26/2020 1020   INR 1.0 08/02/2012 0853     Intake/Output Summary (Last 24 hours) at 05/05/2020 1001 Last data filed at 05/05/2020  0900 Gross per 24 hour  Intake 1763.64 ml  Output 220 ml  Net 1543.64 ml     Assessment:  79 y.o. male is aorto to right common femoral and left hypogastric and external iliac arteries bypass.  Plan:  Primary issue continues to be respiratory failure.  He remains intubated critical care currently managing with possible wean towards extubation.  Continuing chest physiotherapy as well as antibiotics for pneumonia  He has had persistent delirium does appear somewhat clearer this morning able to interact while intubated, we are holding all narcotics  He is getting tube feeds for malnutrition  Atrial fibrillation with RVR heart rate much better controlled with cardiology following.  He is on Eliquis for recent cardioversion.  Acute on chronic blood loss anemia on Eliquis no indications for transfusion at this time  Acute kidney injury with nephrology following.  Creatinine somewhat elevated today although this is really a stable over the past week.  Ether Goebel C. Donzetta Matters, MD [facility] Office: [phone] Pager: [phone]  05/05/2020 10:01 AM

## 2020-05-05 NOTE — Progress Notes (Signed)
[facility] called due to increased pt restlessness, RN can not leave bedside because pt keeps reaching for ETT. Pt is alert and oriented, responds appropriately, able to use written communication, and reports he wants RN at bedside at all times. Pt has been reassured, repositioned, small fan given, pt denies pain. Medication titrated to maximum dose, see MAR

## 2020-05-06 ENCOUNTER — Inpatient Hospital Stay (HOSPITAL_COMMUNITY): Payer: Medicare HMO

## 2020-05-06 DIAGNOSIS — N171 Acute kidney failure with acute cortical necrosis: Secondary | ICD-10-CM | POA: Diagnosis not present

## 2020-05-06 DIAGNOSIS — I5031 Acute diastolic (congestive) heart failure: Secondary | ICD-10-CM

## 2020-05-06 DIAGNOSIS — N170 Acute kidney failure with tubular necrosis: Secondary | ICD-10-CM | POA: Diagnosis not present

## 2020-05-06 LAB — GLUCOSE, CAPILLARY
Glucose-Capillary: 106 mg/dL — ABNORMAL HIGH (ref 70–99)
Glucose-Capillary: 111 mg/dL — ABNORMAL HIGH (ref 70–99)
Glucose-Capillary: 118 mg/dL — ABNORMAL HIGH (ref 70–99)
Glucose-Capillary: 124 mg/dL — ABNORMAL HIGH (ref 70–99)
Glucose-Capillary: 137 mg/dL — ABNORMAL HIGH (ref 70–99)
Glucose-Capillary: 99 mg/dL (ref 70–99)

## 2020-05-06 LAB — CBC WITH DIFFERENTIAL/PLATELET
Abs Immature Granulocytes: 0.12 10*3/uL — ABNORMAL HIGH (ref 0.00–0.07)
Basophils Absolute: 0.1 10*3/uL (ref 0.0–0.1)
Basophils Relative: 1 %
Eosinophils Absolute: 0.3 10*3/uL (ref 0.0–0.5)
Eosinophils Relative: 4 %
HCT: 28.8 % — ABNORMAL LOW (ref 39.0–52.0)
Hemoglobin: 8.7 g/dL — ABNORMAL LOW (ref 13.0–17.0)
Immature Granulocytes: 1 %
Lymphocytes Relative: 6 %
Lymphs Abs: 0.5 10*3/uL — ABNORMAL LOW (ref 0.7–4.0)
MCH: 31.6 pg (ref 26.0–34.0)
MCHC: 30.2 g/dL (ref 30.0–36.0)
MCV: 104.7 fL — ABNORMAL HIGH (ref 80.0–100.0)
Monocytes Absolute: 0.6 10*3/uL (ref 0.1–1.0)
Monocytes Relative: 6 %
Neutro Abs: 7.4 10*3/uL (ref 1.7–7.7)
Neutrophils Relative %: 82 %
Platelets: 320 10*3/uL (ref 150–400)
RBC: 2.75 MIL/uL — ABNORMAL LOW (ref 4.22–5.81)
RDW: 16.5 % — ABNORMAL HIGH (ref 11.5–15.5)
WBC: 8.9 10*3/uL (ref 4.0–10.5)
nRBC: 0 % (ref 0.0–0.2)

## 2020-05-06 LAB — RENAL FUNCTION PANEL
Albumin: 2.3 g/dL — ABNORMAL LOW (ref 3.5–5.0)
Anion gap: 9 (ref 5–15)
BUN: 44 mg/dL — ABNORMAL HIGH (ref 8–23)
CO2: 20 mmol/L — ABNORMAL LOW (ref 22–32)
Calcium: 8.2 mg/dL — ABNORMAL LOW (ref 8.9–10.3)
Chloride: 116 mmol/L — ABNORMAL HIGH (ref 98–111)
Creatinine, Ser: 1.99 mg/dL — ABNORMAL HIGH (ref 0.61–1.24)
GFR, Estimated: 33 mL/min — ABNORMAL LOW (ref >60)
Glucose, Bld: 119 mg/dL — ABNORMAL HIGH (ref 70–99)
Phosphorus: 3.9 mg/dL (ref 2.5–4.6)
Potassium: 4.3 mmol/L (ref 3.5–5.1)
Sodium: 145 mmol/L (ref 135–145)

## 2020-05-06 LAB — TRIGLYCERIDES: Triglycerides: 59 mg/dL (ref <150)

## 2020-05-06 MED ORDER — FUROSEMIDE 10 MG/ML IJ SOLN
80.0000 mg | Freq: Once | INTRAMUSCULAR | Status: AC
  Administered 2020-05-06: 80 mg via INTRAVENOUS
  Filled 2020-05-06: qty 8

## 2020-05-06 MED ORDER — SUFENTANIL CITRATE 50 MCG/ML IV SOLN: 100.0000 ug | Freq: Once | INTRAVENOUS | Status: DC

## 2020-05-06 MED ORDER — PROPOFOL 10 MG/ML IV BOLUS: 0.5000 mg/kg | Freq: Once | INTRAVENOUS | Status: DC

## 2020-05-06 MED ORDER — PROPOFOL 1000 MG/100ML IV EMUL: 5.0000 ug/kg/min | INTRAVENOUS | Status: DC

## 2020-05-06 MED ORDER — MIDAZOLAM HCL 2 MG/2ML IJ SOLN: 5.0000 mg | Freq: Once | INTRAMUSCULAR | Status: DC

## 2020-05-06 MED ORDER — FENTANYL CITRATE (PF) 100 MCG/2ML IJ SOLN: 100.0000 ug | Freq: Once | INTRAMUSCULAR | Status: DC

## 2020-05-06 MED ORDER — VECURONIUM BROMIDE 10 MG IV SOLR: 10.0000 mg | Freq: Once | INTRAVENOUS | Status: DC

## 2020-05-06 NOTE — Progress Notes (Signed)
Progress Note  Patient Name: Carlos Olson Date of Encounter: 05/06/2020  Primary Cardiologist: Werner Lean, MD   Subjective   79 M with Coronary Artery Calcifications, Thoracic Aortic Aneurysm and baseline creatinine 1.19 who presented for AAA 04/17/20 with prolonged course:  Called 04/18/20 for hypotension, through course developed A fib RVR s/p 04/26/20 DCCV to NSR with quick return to atrial fibrillation, ICU Delirium, Aspiration PNA, and AKI.  Uptriaged 05/02/20 for hypercapnia, AMS, and hypotension. With diuresis, prior hypernatremia returned.  Re-intubated 05/04/20.  Still intubated, alert and oriented, nodes and waves.  He signs that he has a light chest pressure, but Tylenol is controlling it well.   Inpatient Medications    Scheduled Meds: . acetaminophen  650 mg Per Tube Q6H   Or  . acetaminophen  325-650 mg Rectal Q6H  . acetylcysteine  3 mL Nebulization TID  . amiodarone  200 mg Per Tube BID  . apixaban  2.5 mg Per Tube BID  . atorvastatin  10 mg Per Tube QHS  . chlorhexidine gluconate (MEDLINE KIT)  15 mL Mouth Rinse BID  . Chlorhexidine Gluconate Cloth  6 each Topical Daily  . darbepoetin (ARANESP) injection - NON-DIALYSIS  100 mcg Subcutaneous Q Mon-1800  . feeding supplement (PROSource TF)  45 mL Per Tube TID  . fluticasone furoate-vilanterol  1 puff Inhalation Daily  . free water  300 mL Per Tube Q4H  . ipratropium-albuterol  3 mL Nebulization TID  . lactulose  30 g Per Tube BID  . lidocaine  2 patch Transdermal Q24H  . mouth rinse  15 mL Mouth Rinse 10 times per day  . pantoprazole sodium  40 mg Per Tube Daily  . umeclidinium bromide  1 puff Inhalation Daily   Continuous Infusions: . sodium chloride Stopped (05/03/20 0058)  . dexmedetomidine (PRECEDEX) IV infusion 0.6 mcg/kg/hr (05/06/20 0800)  . feeding supplement (VITAL 1.5 CAL) 1,000 mL (05/05/20 2341)  . lactated ringers 75 mL/hr at 05/06/20 0800   PRN Meds: alum & mag hydroxide-simeth,  bisacodyl, Gerhardt's butt cream, naLOXone (NARCAN)  injection, ondansetron, oxymetazoline   Vital Signs    Vitals:   05/06/20 0600 05/06/20 0700 05/06/20 0800 05/06/20 0810  BP: (!) 127/56 (!) 116/56 (!) 103/52   Pulse: (!) 46 (!) 51 (!) 43   Resp: (!) 21 (!) 23 (!) 23   Temp:    97.6 F (36.4 C)  TempSrc:      SpO2: 97% 98% 98%   Weight:      Height:        Intake/Output Summary (Last 24 hours) at 05/06/2020 0843 Last data filed at 05/06/2020 0800 Gross per 24 hour  Intake 3382.19 ml  Output 1910 ml  Net 1472.19 ml   Filed Weights   05/03/20 0500 05/05/20 0500 05/06/20 0455  Weight: 103 kg 102.2 kg 101.9 kg    Telemetry    Sinus bradycardia to rhythm, PACs - Personally Reviewed  ECG    No new - Personally Reviewed  Physical Exam   GEN: Intubated, Alert, responds appropriately Neck: No JVD near supine Cardiac: RRR, no murmurs, rubs, or gallops.  Respiratory: crackles bilaterally GI: Soft, nontender, non-distended  MS: +1 edema, Stable to improve R femoral hematoma Neuro:  Nonfocal  Psych: Normal affect   Labs    Chemistry Recent Labs  Lab 05/01/20 0914 05/01/20 1445 05/04/20 1613 05/05/20 0203 05/05/20 1643  NA 140   < > 145 146* 145  K 3.4*   < >  4.0 4.1 3.9  CL 110   < > 114* 116* 116*  CO2 18*   < > 20* 22 21*  GLUCOSE 186*   < > 132* 115* 123*  BUN 35*   < > 37* 44* 44*  CREATININE 2.50*   < > 2.34* 2.51* 2.28*  CALCIUM 7.9*   < > 7.9* 8.0* 8.1*  PROT 5.2*  --   --   --   --   ALBUMIN 1.7*   < > 2.0* 1.9* 2.1*  AST 21  --   --   --   --   ALT 18  --   --   --   --   ALKPHOS 96  --   --   --   --   BILITOT 0.5  --   --   --   --   GFRNONAA 25*   < > 27* 25* 28*  ANIONGAP 12   < > 11 8 8    < > = values in this interval not displayed.    Hematology Recent Labs  Lab 05/03/20 0610 05/03/20 0610 05/04/20 0049 05/04/20 0049 05/04/20 0544 05/04/20 0833 05/05/20 0203  WBC 10.0  --  11.4*  --   --   --  8.9  RBC 2.88*  --  2.94*  --   --    --  2.53*  HGB 9.2*   < > 9.4*   < > 9.9* 9.9* 8.0*  HCT 29.5*   < > 30.8*   < > 29.0* 29.0* 26.6*  MCV 102.4*  --  104.8*  --   --   --  105.1*  MCH 31.9  --  32.0  --   --   --  31.6  MCHC 31.2  --  30.5  --   --   --  30.1  RDW 17.2*  --  17.1*  --   --   --  17.0*  PLT 421*  --  455*  --   --   --  304   < > = values in this interval not displayed.   Cardiac EnzymesNo results for input(s): TROPONINI in the last 168 hours. No results for input(s): TROPIPOC in the last 168 hours.   BNP Recent Labs  Lab 05/01/20 0914  BNP 461.1*     DDimer No results for input(s): DDIMER in the last 168 hours.   Radiology    No results found.  Cardiac Studies   None new  Patient Profile     79 y.o. male s/p AAA repair with complicated postoperative course with AKI, volume overload, and post operative AF  Assessment & Plan    Post operative Afib with RVR HTN  Blood loss anemia - Hb down today 9.9 -> 8.0, today's pending, we will follow closely especially since on Eliquis  Thoracic and Aortic Aneurysm - s/p DCCV 04/26/20 with conversion to NSR, but back to Afib RVR shortly thereafter - converted to SR 05/02/20 - echo with EF 50% with moderate LAE and mild RAE - Remains in SR on now PO Amiodarone 200 mg BID - continue eliquis 2.5 mg BID --> will need uninterrupted AC for 4 weeks from 10/28/21s/p DCCV - DC of lopressor presently, hypotensive   AKI - crea remains elevated but down today 2.5 -> 2.28  Aspiration PNA  Hypoxic and hypercapneic respiratory failure Malnutrition with albumin < 2  Hypernatremia - per nephrology and primary; pharmacy assisting with dosing based on kidney - patient  has hypernatremia, hypoalbuminemia.  Reasonable to pause diuresis; though will eventually need volume off; if unable to wean off with PNA therapies; could re-trial albumin. - I will order CXR.  For questions or updates, please contact [place] Please consult [email or web address] for contact  info under Cardiology/STEMI.     Signed, Ena Dawley, MD  05/06/2020, 8:43 AM

## 2020-05-06 NOTE — Progress Notes (Signed)
  Progress Note    05/06/2020 10:03 AM 10 Days Post-Op  Subjective: Precedex increased overnight for agitation  Vitals:   05/06/20 0900 05/06/20 0904  BP: (!) 107/51   Pulse: (!) 53   Resp: (!) 21   Temp:    SpO2: 97% 98%    Physical Exam: Awake and alert remains intubated Abdomen is soft no tenderness Abdominal and right groin incision healing well Right posterior tibial signal and strong palpable left posterior tibial pulse  CBC    Component Value Date/Time   WBC 8.9 05/05/2020 0203   RBC 2.53 (L) 05/05/2020 0203   HGB 8.0 (L) 05/05/2020 0203   HGB 12.1 (L) 08/20/2012 0610   HCT 26.6 (L) 05/05/2020 0203   HCT 44.0 08/02/2012 0853   PLT 304 05/05/2020 0203   PLT 158 08/20/2012 0610   MCV 105.1 (H) 05/05/2020 0203   MCV 99 08/02/2012 0853   MCH 31.6 05/05/2020 0203   MCHC 30.1 05/05/2020 0203   RDW 17.0 (H) 05/05/2020 0203   RDW 13.7 08/02/2012 0853   LYMPHSABS 0.6 (L) 05/05/2020 0203   MONOABS 0.6 05/05/2020 0203   EOSABS 0.3 05/05/2020 0203   BASOSABS 0.0 05/05/2020 0203    BMET    Component Value Date/Time   NA 145 05/05/2020 1643   NA 136 08/20/2012 0610   K 3.9 05/05/2020 1643   K 4.2 08/20/2012 0610   CL 116 (H) 05/05/2020 1643   CL 102 08/20/2012 0610   CO2 21 (L) 05/05/2020 1643   CO2 26 08/20/2012 0610   GLUCOSE 123 (H) 05/05/2020 1643   GLUCOSE 123 (H) 08/20/2012 0610   BUN 44 (H) 05/05/2020 1643   BUN 7 08/20/2012 0610   CREATININE 2.28 (H) 05/05/2020 1643   CREATININE 0.75 08/20/2012 0610   CALCIUM 8.1 (L) 05/05/2020 1643   CALCIUM 8.4 (L) 08/20/2012 0610   GFRNONAA 28 (L) 05/05/2020 1643   GFRNONAA >60 08/20/2012 0610   GFRAA >60 05/07/2017 0949   GFRAA >60 08/20/2012 0610    INR    Component Value Date/Time   INR 1.5 (H) 04/26/2020 1020   INR 1.0 08/02/2012 0853     Intake/Output Summary (Last 24 hours) at 05/06/2020 1003 Last data filed at 05/06/2020 0937 Gross per 24 hour  Intake 3621.86 ml  Output 1835 ml  Net 1786.86  ml     Assessment:  79 y.o. male is aorto to right common femoral and left hypogastric and external iliac arteries bypass.  Plan:  Respiratory failure: Spontaneous breathing trial yesterday failed.  I have discussed with Dr. Lynetta Mare they will try again today otherwise patient will likely need tracheostomy given ongoing respiratory issues.  Continue antibiotics for pneumonia  Patient has been awake and oriented during the day did require increased Precedex overnight overall delirium appears to be improving  He is getting tube feeds for malnutrition  Atrial fibrillation with RVR heart rate much better controlled with cardiology following.  He is on Eliquis for recent cardioversion and will need for 4 weeks.  Acute on chronic blood loss anemia on Eliquis no indications for transfusion at this time  Acute kidney injury with nephrology following.  Creatinine is stable somewhat improved at yesterday's recheck  [place]. Donzetta Matters, MD [facility] Office: [phone] Pager: [place]. Donzetta Matters, MD [facility] [place] Office: [phone] Pager: [phone]  05/06/2020 10:03 AM

## 2020-05-06 NOTE — Progress Notes (Signed)
NAME:  Carlos Olson, MRN:  [id number], DOB:  [birth year], LOS: 38 ADMISSION DATE:  04/17/2020, CONSULTATION DATE: 10/21 REFERRING MD: Dr. Donzetta Matters, CHIEF COMPLAINT: AKI with mild hypoxia  Brief History   79 year old male who presented 10/18 for elective endovascular repair abdominal aortic aneurysm with Dr. Donzetta Matters. PCCM consulted 10/21 for assistance in medical management.  Hospital course complicated by AKI, metabolic encephalopathy, hypoxia/ RLL pneumonia, and hypotension. Treated in ICU for sepsis, AFRVR, underwent cardioversion, ABX course. Moved out of ICU. Ongoing course significant for AKI, AF, debility. Then 11/2 he became encephalopathic overnight and was transferred back to ICU. PCCM re-consulted.   Past Medical History  AAA History of prostate cancer Heart murmur Diverticulitis COPD CAD atherosclerosis  Significant Hospital Events   Admitted 10/18 for elective AAA repair 10/19 OR   Consults:  Cardiology PCCM Nephrology GI  Procedures:  Endovascular AAA repair 10/19  Significant Diagnostic Tests:  04/21/2020 CT A/P > Status post abdominal aortic repair with aortobifemoral bypass graft. Some stranding is noted in the operative site likely related to postoperative change. No sizable hematoma is seen. Diverticulosis without diverticulitis. Near complete consolidation of the right lower lobe. Small left pleural effusion is seen with left basilar atelectasis. 10/25 CTH > 1. Stable.  No acute intracranial abnormality. 2. Atrophy with chronic small vessel white matter ischemic disease.  Micro Data:  Blood 10/28 > negative Sputum 10/28 > few candida  Antimicrobials:  10/19 cefazolin 10/22 zosyn > 10/26 Vancomycin 10/29 > 11/2  Zosyn 10/29 > 11/3 Fluconazole 10/29 >>>  Interim history/subjective:  Brighter yesterday but failed SBT after the 1h mark. Doing better today.  Objective   Blood pressure 123/67, pulse 69, temperature 97.6 F (36.4 C), resp. rate (!) 27, height  6\' 3"  (1.905 m), weight 101.9 kg, SpO2 95 %.    Vent Mode: PRVC FiO2 (%):  [50 %] 50 % Set Rate:  [26 bmp] 26 bmp Vt Set:  [630 mL] 630 mL PEEP:  [5 cmH20] 5 cmH20 Plateau Pressure:  [16 cmH20-18 cmH20] 18 cmH20   Intake/Output Summary (Last 24 hours) at 05/06/2020 1050 Last data filed at 05/06/2020 1000 Gross per 24 hour  Intake 3767.17 ml  Output 1835 ml  Net 1932.17 ml   Filed Weights   05/03/20 0500 05/05/20 0500 05/06/20 0455  Weight: 103 kg 102.2 kg 101.9 kg   Examination: General: Elderly man lying in bed, mild dyspnea when dropped to PSV 5/5/   HEENT: Dry MM. Small bore feeding tube noted to right nare.  Neuro: Sleepy, awakens with physical stimulation, follows simple comamnds. CV: Regular rate and rhythm, no murmurs PULM: scattered rhonchi at lower lobes. Mild accessory muscle use. Vt of 675 on PSV 5/5  GI: Soft, nontender, nondistended.  Midline incision > clean, dry  Extremities: trace edema  Resolved Hospital Problem list     Assessment & Plan:   Acute hypercarbic respiratory failure due to combination of oversedation from narcotics and respiratory muscle fatigue Hypotension due to hypovolemia. 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  If fails will trach tomorrow.  - Aggressive chest physiotherapy.  - Complete 7days of Zosyn for pneumonia, but poor secretion clearance may be more at play - Avoid all narcotics, but continued delirium and debility  despite no narcotics for >48h.  - Delirium appears to have cleared. Watch for CO2 narcosis once extubated. May benefit from nocturnal BiPAP once extubated until respiratory muscle strength improves.  - Gentle diuresis.  Creatinine didn't change significantly with IV  - Continue amiodarone and apixiban enterally - Continue bronchodilators - Ensure enteral nutrition.   Best practice:  Diet: NPO/ EN via SBFT Pain/Anxiety/Delirium protocol (if indicated): Precedex to RASS -1. VAP protocol (if indicated):  DVT prophylaxis: Eliquis GI prophylaxis: PPI Glucose control: euglycemic on no coverage Mobility: push PT efforts Code Status: Full Family Communication: updated daughter at bedside with Dr Donzetta Matters. We agree that patient's condition is still recoverable, but he now has a substantial hill to climb to recovery due to delirium and deconditioning. Agreed that secure nutrition and early trach would provide best hope of recovery.  Disposition: ICU  Labs   CBC: Recent Labs  Lab 05/01/20 0914 05/01/20 1445 05/02/20 0601 05/02/20 0601 05/03/20 0610 05/04/20 0049 05/04/20 0544 05/04/20 0833 05/05/20 0203  WBC 14.0*  --  7.9  --  10.0 11.4*  --   --  8.9  NEUTROABS 12.7*  --   --   --   --   --   --   --  7.3  HGB 9.5*   < > 9.0*   < > 9.2* 9.4* 9.9* 9.9* 8.0*  HCT 31.1*   < > 28.8*   < > 29.5* 30.8* 29.0* 29.0* 26.6*  MCV 102.6*  --  102.1*  --  102.4* 104.8*  --   --  105.1*  PLT 416*  --  397  --  421* 455*  --   --  304   < > = values in this interval not displayed.    Basic Metabolic Panel: Recent Labs  Lab 05/01/20 1609 05/01/20 2145 05/02/20 0601 05/02/20 1534 05/03/20 0610 05/03/20 0610 05/03/20 1618 05/03/20 1618 05/04/20 0049 05/04/20 0049 05/04/20 0544 05/04/20 0833 05/04/20 1613 05/05/20 0203 05/05/20 1643  NA   < >  --  146*   < > 147*   < > 147*   < > 147*   < > 148* 149* 145 146* 145  K   < >  --  3.4*   < > 3.6   < > 3.9   < > 3.8   < > 4.3 4.5 4.0 4.1 3.9  CL   < >  --  113*   < > 116*   < > 114*  --  115*  --   --   --  114* 116* 116*  CO2   < >  --  22   < > 21*   < > 23  --  22  --   --   --  20* 22 21*  GLUCOSE   < >  --  128*   < > 134*   < > 133*  --  147*  --   --    --  132* 115* 123*  BUN   < >  --  36*   < > 33*   < > 31*  --  29*  --   --   --  37* 44* 44*  CREATININE   < >  --  2.73*   < > 2.49*   < > 2.23*  --  2.19*  --   --   --  2.34* 2.51* 2.28*  CALCIUM   < >  --  8.1*   < >  8.2*   < > 8.4*  --  8.4*  --   --   --  7.9* 8.0* 8.1*  MG  --  1.6* 2.1  --  1.9  --   --   --  2.2  --   --   --   --  2.0  --   PHOS   < >  --  4.4   < > 3.4  3.5   < > 3.6  --  3.7  3.7  --   --   --  3.2 3.4 3.5   < > = values in this interval not displayed.   GFR: Estimated Creatinine Clearance: 33.4 mL/min (A) (by C-G formula based on SCr of 2.28 mg/dL (H)). Recent Labs  Lab 05/01/20 0914 05/01/20 0914 05/02/20 0601 05/03/20 0610 05/04/20 0049 05/05/20 0203  PROCALCITON 0.34  --  0.31 0.13  --   --   WBC 14.0*   < > 7.9 10.0 11.4* 8.9  LATICACIDVEN 1.3  --   --   --   --   --    < > = values in this interval not displayed.    Liver Function Tests: Recent Labs  Lab 05/01/20 0914 05/01/20 1609 05/03/20 1618 05/04/20 0049 05/04/20 1613 05/05/20 0203 05/05/20 1643  AST 21  --   --   --   --   --   --   ALT 18  --   --   --   --   --   --   ALKPHOS 96  --   --   --   --   --   --   BILITOT 0.5  --   --   --   --   --   --   PROT 5.2*  --   --   --   --   --   --   ALBUMIN 1.7*   < > 2.7* 2.4* 2.0* 1.9* 2.1*   < > = values in this interval not displayed.   No results for input(s): LIPASE, AMYLASE in the last 168 hours. Recent Labs  Lab 05/01/20 0914 05/03/20 1618  AMMONIA 66* 22    ABG    Component Value Date/Time   PHART 7.245 (L) 05/04/2020 0833   PCO2ART 55.8 (H) 05/04/2020 0833   PO2ART 155 (H) 05/04/2020 0833   HCO3 24.2 05/04/2020 0833   TCO2 26 05/04/2020 0833   ACIDBASEDEF 3.0 (H) 05/04/2020 0833   O2SAT 99.0 05/04/2020 0833     Coagulation Profile: No results for input(s): INR, PROTIME in the last 168 hours.  Cardiac Enzymes: No results for input(s): CKTOTAL, CKMB, CKMBINDEX, TROPONINI in the last 168  hours.  HbA1C: No results found for: HGBA1C  CBG: Recent Labs  Lab 05/05/20 1454 05/05/20 2005 05/05/20 2326 05/06/20 0347 05/06/20 0745  GLUCAP 121* 105* 128* 118* 124*   CRITICAL CARE Performed by: Kipp Brood   Total critical care time: 40 minutes  Critical care time was exclusive of separately billable procedures and treating other patients.  Critical care was necessary to treat or prevent imminent or life-threatening deterioration.  Critical care was time spent personally by me on the following activities: development of treatment plan with patient and/or surrogate as well as nursing, discussions with consultants, evaluation of patient's response to treatment, examination of patient, obtaining history from patient or surrogate, ordering and performing treatments and interventions, ordering and review of laboratory studies, ordering and review of radiographic studies,  pulse oximetry, re-evaluation of patient's condition and participation in multidisciplinary rounds.  Kipp Brood, MD [facility] ICU Physician [place]  Pager: [phone] Mobile: [phone] After hours: [phone].

## 2020-05-06 NOTE — Progress Notes (Signed)
[facility] Progress Note    Assessment/ Plan:   AKI: Likely secondary to acute tubular injury in the context of hypotension, worsening anemia.  Possible vascular injury/cholesterol emboli associated with surgery.  PVL unable to assess left renal artery but does appear to have flow in the parenchyma.  Right renal artery is okay.  C3 not decreased.. BL normal renal function.  Kidney function has remained relatively stable despite his ongoing acute illness.  This could represent a new baseline -Agree with cardiology that diuretics may need to be restarted in the near future to help with ventilatory status if methods of airway clearance do not improve oxygenation -No need for dialysis at this time - can give albumin as needed for low serum albumin -Possible left renal artery compromise given it was not visualized on PVL but cannot say for sure  Hypernatremia: Significantly improved with free water hydration  Hypoxia: Likely multifactorial with history pneumonia and poor airway clearance contributing.  Pulmonary edema could be contributing factor as well.  Would consider reinstitution of diuretics if needed for persistent hypoxia this not felt to be due to pulmonary infiltrates or decreased airway clearance  Metabolic acidosis/Hyperchloremia/Hypokalemia: Overall improved.  Can restart bicarbonate if needed  H/o HTN:  Blood pressure variable related to atrial fibrillation and sedation.  Can titrate meds as needed.  Anemia -Transfuse for Hgb<7 g/dL-transfused on 10/30.  On Aranesp 100 mcg weekly.  Dose can be adjusted if needed.  IMA globin has been relatively stable  A. fib.    Intermittent issue related to multiple factors.  Continue management per cardiology  Encephalopathy: Multifactorial with metabolic encephalopathy, medications, ICU delirium contributing.  Improved over the past 24 hours  Elective open abdominal and common iliac artery aneurysm and right common femoral  artery aneurysm repair: mgmt per vasc surg  Given the patient's overall stable creatinine for several days we will sign off.  Happy to be reinvolved in the case if needed in the future.    Subjective:   Continues to be relatively stable.  More alert yesterday.  Creatinine is reassuring.   Objective:   BP (!) 116/56   Pulse (!) 51   Temp 98.1 F (36.7 C) (Axillary)   Resp (!) 23   Ht _0  (1.905 m)   Wt 101.9 kg   SpO2 98%   BMI 28.08 kg/m   Intake/Output Summary (Last 24 hours) at 05/06/2020 0758 Last data filed at 05/06/2020 0700 Gross per 24 hour  Intake 3393.18 ml  Output 1910 ml  Net 1483.18 ml   Weight change: -0.3 kg  Physical Exam: Gen: In bed, resting comfortably HEENT: moist mucosal membranes, ET tube in place CVS: Normal rate, no audible murmur Resp: Coarse bilateral breath sounds,  bilateral chest rise SEG:BTDV, nondistended Ext: pitting edema in BLE Neuro: awake, alert, nods head to questions  Imaging: No results found.  Labs: BMET Recent Labs  Lab 05/02/20 1534 05/02/20 1534 05/03/20 0610 05/03/20 0610 05/03/20 1618 05/04/20 0049 05/04/20 0544 05/04/20 0833 05/04/20 1613 05/05/20 0203 05/05/20 1643  NA 146*   < > 147*   < > 147* 147* 148* 149* 145 146* 145  K 3.6   < > 3.6   < > 3.9 3.8 4.3 4.5 4.0 4.1 3.9  CL 115*  --  116*  --  114* 115*  --   --  114* 116* 116*  CO2 21*  --  21*  --  23 22  --   --  20*  22 21*  GLUCOSE 108*  --  134*  --  133* 147*  --   --  132* 115* 123*  BUN 38*  --  33*  --  31* 29*  --   --  37* 44* 44*  CREATININE 2.68*  --  2.49*  --  2.23* 2.19*  --   --  2.34* 2.51* 2.28*  CALCIUM 8.3*  --  8.2*  --  8.4* 8.4*  --   --  7.9* 8.0* 8.1*  PHOS 3.4  --  3.4  3.5  --  3.6 3.7  3.7  --   --  3.2 3.4 3.5   < > = values in this interval not displayed.   CBC Recent Labs  Lab 05/01/20 0914 05/01/20 1445 05/02/20 0601 05/02/20 0601 05/03/20 0610 05/03/20 0610 05/04/20 0049 05/04/20 0544 05/04/20 0833  05/05/20 0203  WBC 14.0*  --  7.9  --  10.0  --  11.4*  --   --  8.9  NEUTROABS 12.7*  --   --   --   --   --   --   --   --  7.3  HGB 9.5*   < > 9.0*   < > 9.2*   < > 9.4* 9.9* 9.9* 8.0*  HCT 31.1*   < > 28.8*   < > 29.5*   < > 30.8* 29.0* 29.0* 26.6*  MCV 102.6*  --  102.1*  --  102.4*  --  104.8*  --   --  105.1*  PLT 416*  --  397  --  421*  --  455*  --   --  304   < > = values in this interval not displayed.    Medications:    . acetaminophen  650 mg Per Tube Q6H   Or  . acetaminophen  325-650 mg Rectal Q6H  . acetylcysteine  3 mL Nebulization TID  . amiodarone  200 mg Per Tube BID  . apixaban  2.5 mg Per Tube BID  . atorvastatin  10 mg Per Tube QHS  . chlorhexidine gluconate (MEDLINE KIT)  15 mL Mouth Rinse BID  . Chlorhexidine Gluconate Cloth  6 each Topical Daily  . darbepoetin (ARANESP) injection - NON-DIALYSIS  100 mcg Subcutaneous Q Mon-1800  . feeding supplement (PROSource TF)  45 mL Per Tube TID  . fluticasone furoate-vilanterol  1 puff Inhalation Daily  . free water  300 mL Per Tube Q4H  . ipratropium-albuterol  3 mL Nebulization TID  . lactulose  30 g Per Tube BID  . lidocaine  2 patch Transdermal Q24H  . mouth rinse  15 mL Mouth Rinse 10 times per day  . pantoprazole sodium  40 mg Per Tube Daily  . umeclidinium bromide  1 puff Inhalation Daily      [facility] 05/06/2020, 7:58 AM

## 2020-05-07 DIAGNOSIS — I4819 Other persistent atrial fibrillation: Secondary | ICD-10-CM | POA: Diagnosis not present

## 2020-05-07 DIAGNOSIS — N170 Acute kidney failure with tubular necrosis: Secondary | ICD-10-CM | POA: Diagnosis not present

## 2020-05-07 LAB — CBC WITH DIFFERENTIAL/PLATELET
Abs Immature Granulocytes: 0.07 10*3/uL (ref 0.00–0.07)
Basophils Absolute: 0 10*3/uL (ref 0.0–0.1)
Basophils Relative: 1 %
Eosinophils Absolute: 0.4 10*3/uL (ref 0.0–0.5)
Eosinophils Relative: 6 %
HCT: 27.9 % — ABNORMAL LOW (ref 39.0–52.0)
Hemoglobin: 8.4 g/dL — ABNORMAL LOW (ref 13.0–17.0)
Immature Granulocytes: 1 %
Lymphocytes Relative: 9 %
Lymphs Abs: 0.6 10*3/uL — ABNORMAL LOW (ref 0.7–4.0)
MCH: 31.5 pg (ref 26.0–34.0)
MCHC: 30.1 g/dL (ref 30.0–36.0)
MCV: 104.5 fL — ABNORMAL HIGH (ref 80.0–100.0)
Monocytes Absolute: 0.6 10*3/uL (ref 0.1–1.0)
Monocytes Relative: 8 %
Neutro Abs: 5.4 10*3/uL (ref 1.7–7.7)
Neutrophils Relative %: 75 %
Platelets: 230 10*3/uL (ref 150–400)
RBC: 2.67 MIL/uL — ABNORMAL LOW (ref 4.22–5.81)
RDW: 16.4 % — ABNORMAL HIGH (ref 11.5–15.5)
WBC: 7.1 10*3/uL (ref 4.0–10.5)
nRBC: 0 % (ref 0.0–0.2)

## 2020-05-07 LAB — GLUCOSE, CAPILLARY
Glucose-Capillary: 102 mg/dL — ABNORMAL HIGH (ref 70–99)
Glucose-Capillary: 104 mg/dL — ABNORMAL HIGH (ref 70–99)
Glucose-Capillary: 110 mg/dL — ABNORMAL HIGH (ref 70–99)
Glucose-Capillary: 116 mg/dL — ABNORMAL HIGH (ref 70–99)
Glucose-Capillary: 120 mg/dL — ABNORMAL HIGH (ref 70–99)
Glucose-Capillary: 87 mg/dL (ref 70–99)

## 2020-05-07 LAB — RENAL FUNCTION PANEL
Albumin: 2.1 g/dL — ABNORMAL LOW (ref 3.5–5.0)
Anion gap: 8 (ref 5–15)
BUN: 49 mg/dL — ABNORMAL HIGH (ref 8–23)
CO2: 20 mmol/L — ABNORMAL LOW (ref 22–32)
Calcium: 8.3 mg/dL — ABNORMAL LOW (ref 8.9–10.3)
Chloride: 118 mmol/L — ABNORMAL HIGH (ref 98–111)
Creatinine, Ser: 1.85 mg/dL — ABNORMAL HIGH (ref 0.61–1.24)
GFR, Estimated: 36 mL/min — ABNORMAL LOW (ref >60)
Glucose, Bld: 109 mg/dL — ABNORMAL HIGH (ref 70–99)
Phosphorus: 3.8 mg/dL (ref 2.5–4.6)
Potassium: 4.4 mmol/L (ref 3.5–5.1)
Sodium: 146 mmol/L — ABNORMAL HIGH (ref 135–145)

## 2020-05-07 LAB — PROTIME-INR
INR: 1.3 — ABNORMAL HIGH (ref 0.8–1.2)
Prothrombin Time: 16 seconds — ABNORMAL HIGH (ref 11.4–15.2)

## 2020-05-07 LAB — APTT
aPTT: 26 seconds (ref 24–36)
aPTT: 53 seconds — ABNORMAL HIGH (ref 24–36)

## 2020-05-07 MED ORDER — PROPOFOL 10 MG/ML IV BOLUS: 0.5000 mg/kg | Freq: Once | INTRAVENOUS | Status: DC

## 2020-05-07 MED ORDER — AMIODARONE LOAD VIA INFUSION
150.0000 mg | Freq: Once | INTRAVENOUS | Status: AC
  Administered 2020-05-07: 150 mg via INTRAVENOUS
  Filled 2020-05-07: qty 83.34

## 2020-05-07 MED ORDER — ORAL CARE MOUTH RINSE
15.0000 mL | Freq: Two times a day (BID) | OROMUCOSAL | Status: DC
  Administered 2020-05-07 – 2020-05-08 (×3): 15 mL via OROMUCOSAL

## 2020-05-07 MED ORDER — HEPARIN (PORCINE) 25000 UT/250ML-% IV SOLN
2000.0000 [IU]/h | INTRAVENOUS | Status: DC
  Administered 2020-05-07: 2200 [IU]/h via INTRAVENOUS
  Administered 2020-05-07: 2000 [IU]/h via INTRAVENOUS
  Administered 2020-05-08: 2250 [IU]/h via INTRAVENOUS
  Administered 2020-05-09: 2000 [IU]/h via INTRAVENOUS
  Filled 2020-05-07 (×4): qty 250

## 2020-05-07 MED ORDER — AMIODARONE HCL 200 MG PO TABS
200.0000 mg | ORAL_TABLET | Freq: Every day | ORAL | Status: DC
  Administered 2020-05-07 – 2020-05-21 (×15): 200 mg
  Filled 2020-05-07 (×15): qty 1

## 2020-05-07 MED ORDER — CHLORHEXIDINE GLUCONATE 0.12 % MT SOLN
15.0000 mL | Freq: Two times a day (BID) | OROMUCOSAL | Status: DC
  Administered 2020-05-07 – 2020-05-08 (×3): 15 mL via OROMUCOSAL
  Filled 2020-05-07 (×3): qty 15

## 2020-05-07 MED ORDER — AMIODARONE HCL IN DEXTROSE 360-4.14 MG/200ML-% IV SOLN
30.0000 mg/h | INTRAVENOUS | Status: AC
  Administered 2020-05-08 (×2): 30 mg/h via INTRAVENOUS
  Filled 2020-05-07 (×2): qty 200

## 2020-05-07 MED ORDER — FENTANYL CITRATE (PF) 100 MCG/2ML IJ SOLN: 100.0000 ug | Freq: Once | INTRAMUSCULAR | Status: DC

## 2020-05-07 MED ORDER — VECURONIUM BROMIDE 10 MG IV SOLR: 10.0000 mg | Freq: Once | INTRAVENOUS | Status: DC

## 2020-05-07 MED ORDER — DEXMEDETOMIDINE HCL IN NACL 400 MCG/100ML IV SOLN
0.0000 ug/kg/h | INTRAVENOUS | Status: AC
  Administered 2020-05-07: 1.1 ug/kg/h via INTRAVENOUS
  Administered 2020-05-08: 0.1 ug/kg/h via INTRAVENOUS
  Administered 2020-05-08: 1 ug/kg/h via INTRAVENOUS
  Administered 2020-05-10: 0.2 ug/kg/h via INTRAVENOUS
  Filled 2020-05-07 (×6): qty 100

## 2020-05-07 MED ORDER — AMIODARONE HCL IN DEXTROSE 360-4.14 MG/200ML-% IV SOLN
60.0000 mg/h | INTRAVENOUS | Status: AC
  Administered 2020-05-07 (×2): 60 mg/h via INTRAVENOUS
  Filled 2020-05-07: qty 200

## 2020-05-07 MED ORDER — NUTRISOURCE FIBER PO PACK
1.0000 | PACK | Freq: Two times a day (BID) | ORAL | Status: DC
  Administered 2020-05-08 – 2020-05-30 (×46): 1
  Filled 2020-05-07 (×48): qty 1

## 2020-05-07 NOTE — Progress Notes (Addendum)
[facility] [place]  Subjective  - Responds to commands and follows instructions.  Wife at bedside.   Objective (!) 137/57 (!) 47 98.4 F (36.9 C) (Oral) (!) 26 96%  Intake/Output Summary (Last 24 hours) at 05/07/2020 0728 Last data filed at 05/07/2020 0700 Gross per 24 hour  Intake 2333.33 ml  Output 3020 ml  Net -686.67 ml    Lungs supported intubated Heart A fib treated  Abdomin soft, NTTP, groin soft healing well Motor and sensation intact B LE, palpable popliteals B.  Edema B feet warm well perfused    Assessment/Planning:  79 y.o.maleis aorto to right common femoral and left hypogastric and external iliac arteries bypass.  Lungs Unable to wean off intubation, plan to trach later this week after Eliquis held per CCM Kidney Cr 1.85 Nephrology watching, no need for HD currently.  Urine OP 310  Afib managed with amiodarone  HGB 8.4 stable on Eliquis no indication for transfusion currently   Roxy Horseman 05/07/2020 7:28 AM --  Laboratory Lab Results: Recent Labs    05/06/20 1243 05/07/20 0435  WBC 8.9 7.1  HGB 8.7* 8.4*  HCT 28.8* 27.9*  PLT 320 230   BMET Recent Labs    05/06/20 1243 05/07/20 0435  NA 145 146*  K 4.3 4.4  CL 116* 118*  CO2 20* 20*  GLUCOSE 119* 109*  BUN 44* 49*  CREATININE 1.99* 1.85*  CALCIUM 8.2* 8.3*    COAG Lab Results  Component Value Date   INR 1.3 (H) 05/07/2020   INR 1.5 (H) 04/26/2020   INR 1.2 04/17/2020   No results found for: PTT   I have independently interviewed and examined patient and agree with PA assessment and plan above.  Plan to hold Eliquis and perform trach later this week.  He continues to make improvements from renal standpoint.  Continue tube feeding.  Wife updated at bedside.  Janeil Schexnayder C. Donzetta Matters, MD [facility] [place] Office: [phone] Pager: [phone]

## 2020-05-07 NOTE — Progress Notes (Signed)
ANTICOAGULATION CONSULT NOTE - Initial Consult  Pharmacy Consult for heparin  Indication: atrial fibrillation  Allergies  Allergen Reactions  . Dilaudid [Hydromorphone Hcl] Other (See Comments)    Oversedation  . Levaquin [Levofloxacin]     Aortic Aneurysm  . Sulfonamide Derivatives Itching  . Sulfasalazine Itching    Patient Measurements: Height: 6\' 3"  (190.5 cm) Weight: 102.9 kg (226 lb 13.7 oz) IBW/kg (Calculated) : 84.5   Vital Signs: Temp: 98.1 F (36.7 C) (11/08 0743) Temp Source: Axillary (11/08 0743) BP: 119/56 (11/08 0900) Pulse Rate: 60 (11/08 0900)  Labs: Recent Labs    05/05/20 0203 05/05/20 0203 05/05/20 1643 05/06/20 1243 05/07/20 0435  HGB 8.0*   < >  --  8.7* 8.4*  HCT 26.6*  --   --  28.8* 27.9*  PLT 304  --   --  320 230  APTT  --   --   --   --  26  LABPROT  --   --   --   --  16.0*  INR  --   --   --   --  1.3*  CREATININE 2.51*   < > 2.28* 1.99* 1.85*   < > = values in this interval not displayed.    Estimated Creatinine Clearance: 41.4 mL/min (A) (by C-G formula based on SCr of 1.85 mg/dL (H)).   Medical History: Past Medical History:  Diagnosis Date  . AAA (abdominal aortic aneurysm) ([place])   . Atherosclerosis of aortic arch ([place]) 09/25/2015   By CT scan   . CAD (coronary artery disease) 09/25/2015   Mild 3v by CT scan   . Cancer [facility])    prostate  . Colon polyps   . COPD (chronic obstructive pulmonary disease) ([place])   . Diverticulosis of colon   . Femoral bruit    bilateral, found by Dr. Jefm Bryant  . Heart murmur    child  . History of prostate cancer   . IBS (irritable bowel syndrome)    improved after retirement  . 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 Pelvic fracture ([place])   . Pneumonia    child  . Pulmonary nodules 09/25/2015   On screening CT scan, rpt 1 yr   . Shortness of breath    doe     Assessment: 79 yo male with plans for tracheostomy.  He is on apixaban for  afib with DCCV on 04/26/20 (last dose of apixaban was 11/7 in the evening). Pharmacy consulted to dose heparin (he was on heparin 04/27/20 and was at goal on 2000 units/hr) -Hg= 8.4  Goal of Therapy:  Heparin level 0.3-0.7 units/ml aPTT 66-102 seconds Monitor platelets by anticoagulation protocol: Yes   Plan:   -start heparin at 2000 units/hr -aPTT in 8 hours -Daily aPTT, heparin level and CBC  Hildred Laser, PharmD Clinical Pharmacist **Pharmacist phone directory can now be found on [email or web address] ([email or web address]).  Listed under [place].

## 2020-05-07 NOTE — Progress Notes (Signed)
Patient performed a VC of 857 ml's and a NIF of -20. MD is aware.

## 2020-05-07 NOTE — Progress Notes (Signed)
PT Cancellation Note  Patient Details Name: Carlos Olson MRN: [id number] DOB: 03/11/1941   Cancelled Treatment:     respiratory therapy and RN requesting cancel PT to concentrate on vent weaning, will attempt tomorrow  Lyanne Co, DPT Acute Rehabilitation Services [phone]   [place] 05/07/2020, 11:48 AM

## 2020-05-07 NOTE — Procedures (Signed)
Extubation Procedure Note  Patient Details:   Name: Carlos Olson DOB: [birth year] MRN: [id number]   Airway Documentation:    Vent end date: 05/07/20 Vent end time: 5800   Evaluation  O2 sats: stable throughout Complications: No apparent complications Patient did tolerate procedure well. Bilateral Breath Sounds: Clear, Diminished   Yes   Patient extubated to a 4L [place]. Cuff leak was heard. No stridor was noted. Patient tolerated well. He was able to clear his secretions and speak.  [name] 05/07/2020, 12:44 PM

## 2020-05-07 NOTE — Progress Notes (Addendum)
Cardiology Progress Note  Patient ID: Carlos Olson MRN: [id number] DOB: December 07, 1940 Date of Encounter: 05/07/2020  Primary Cardiologist: Werner Lean, MD  Subjective   Chief Complaint: Intubated on vent.  Following commands.  HPI: Intubated on vent.  Following commands.  Bradycardia noted on telemetry.  No further recurrence of atrial fibrillation.  Tracheostomy on hold as Eliquis was not held.  ROS:  All other ROS reviewed and negative. Pertinent positives noted in the HPI.     Inpatient Medications  Scheduled Meds: . acetaminophen  650 mg Per Tube Q6H   Or  . acetaminophen  325-650 mg Rectal Q6H  . acetylcysteine  3 mL Nebulization TID  . amiodarone  200 mg Per Tube Daily  . atorvastatin  10 mg Per Tube QHS  . chlorhexidine gluconate (MEDLINE KIT)  15 mL Mouth Rinse BID  . Chlorhexidine Gluconate Cloth  6 each Topical Daily  . darbepoetin (ARANESP) injection - NON-DIALYSIS  100 mcg Subcutaneous Q Mon-1800  . feeding supplement (PROSource TF)  45 mL Per Tube TID  . fentaNYL (SUBLIMAZE) injection  100 mcg Intravenous Once  . fluticasone furoate-vilanterol  1 puff Inhalation Daily  . free water  300 mL Per Tube Q4H  . ipratropium-albuterol  3 mL Nebulization TID  . lactulose  30 g Per Tube BID  . lidocaine  2 patch Transdermal Q24H  . mouth rinse  15 mL Mouth Rinse 10 times per day  . pantoprazole sodium  40 mg Per Tube Daily  . propofol  0.5 mg/kg Intravenous Once  . SUFentanil  100 mcg Intravenous Once  . umeclidinium bromide  1 puff Inhalation Daily  . vecuronium  10 mg Intravenous Once   Continuous Infusions: . sodium chloride Stopped (05/03/20 0058)  . dexmedetomidine (PRECEDEX) IV infusion    . feeding supplement (VITAL 1.5 CAL) 60 mL/hr at 05/06/20 2000  . propofol (DIPRIVAN) infusion     PRN Meds: alum & mag hydroxide-simeth, bisacodyl, Gerhardt's butt cream, naLOXone (NARCAN)  injection, ondansetron, oxymetazoline   Vital Signs   Vitals:    05/07/20 0700 05/07/20 0743 05/07/20 0805 05/07/20 0900  BP: (!) 137/57  (!) 130/59 (!) 119/56  Pulse: (!) 47   60  Resp: (!) 26   (!) 22  Temp:  98.1 F (36.7 C)    TempSrc:  Axillary    SpO2: 96%  98% 94%  Weight:      Height:        Intake/Output Summary (Last 24 hours) at 05/07/2020 0902 Last data filed at 05/07/2020 0700 Gross per 24 hour  Intake 2034.31 ml  Output 2970 ml  Net -935.69 ml   Last 3 Weights 05/07/2020 05/06/2020 05/05/2020  Weight (lbs) 226 lb 13.7 oz 224 lb 10.4 oz 225 lb 5 oz  Weight (kg) 102.9 kg 101.9 kg 102.2 kg      Telemetry  Overnight telemetry shows sinus bradycardia with heart rate in the 40s, which I personally reviewed.   ECG  The most recent ECG shows atrial fibrillation with heart rate 93, which I personally reviewed.   Physical Exam   Vitals:   05/07/20 0700 05/07/20 0743 05/07/20 0805 05/07/20 0900  BP: (!) 137/57  (!) 130/59 (!) 119/56  Pulse: (!) 47   60  Resp: (!) 26   (!) 22  Temp:  98.1 F (36.7 C)    TempSrc:  Axillary    SpO2: 96%  98% 94%  Weight:      Height:  Intake/Output Summary (Last 24 hours) at 05/07/2020 0902 Last data filed at 05/07/2020 0700 Gross per 24 hour  Intake 2034.31 ml  Output 2970 ml  Net -935.69 ml    Last 3 Weights 05/07/2020 05/06/2020 05/05/2020  Weight (lbs) 226 lb 13.7 oz 224 lb 10.4 oz 225 lb 5 oz  Weight (kg) 102.9 kg 101.9 kg 102.2 kg    Body mass index is 28.35 kg/m.  General: Intubated, on ventilator Head: Atraumatic, normal size  Eyes: PEERLA, EOMI  Neck: Supple, no JVD Endocrine: No thryomegaly Cardiac: Normal S1, S2; RRR; no murmurs, rubs, or gallops Lungs: Diminished breath sounds bilaterally Abd: Soft, nontender, no hepatomegaly  Ext: Trace edema Musculoskeletal: No deformities, BUE and BLE strength normal and equal Skin: Warm and dry, no rashes   Neuro: Alert, awake on vent, will follow commands  Labs  High Sensitivity Troponin:  No results for input(s): TROPONINIHS in  the last 720 hours.   Cardiac EnzymesNo results for input(s): TROPONINI in the last 168 hours. No results for input(s): TROPIPOC in the last 168 hours.  Chemistry Recent Labs  Lab 05/01/20 0914 05/01/20 1445 05/05/20 1643 05/06/20 1243 05/07/20 0435  NA 140   < > 145 145 146*  K 3.4*   < > 3.9 4.3 4.4  CL 110   < > 116* 116* 118*  CO2 18*   < > 21* 20* 20*  GLUCOSE 186*   < > 123* 119* 109*  BUN 35*   < > 44* 44* 49*  CREATININE 2.50*   < > 2.28* 1.99* 1.85*  CALCIUM 7.9*   < > 8.1* 8.2* 8.3*  PROT 5.2*  --   --   --   --   ALBUMIN 1.7*   < > 2.1* 2.3* 2.1*  AST 21  --   --   --   --   ALT 18  --   --   --   --   ALKPHOS 96  --   --   --   --   BILITOT 0.5  --   --   --   --   GFRNONAA 25*   < > 28* 33* 36*  ANIONGAP 12   < > 8 9 8    < > = values in this interval not displayed.    Hematology Recent Labs  Lab 05/05/20 0203 05/06/20 1243 05/07/20 0435  WBC 8.9 8.9 7.1  RBC 2.53* 2.75* 2.67*  HGB 8.0* 8.7* 8.4*  HCT 26.6* 28.8* 27.9*  MCV 105.1* 104.7* 104.5*  MCH 31.6 31.6 31.5  MCHC 30.1 30.2 30.1  RDW 17.0* 16.5* 16.4*  PLT 304 320 230   BNP Recent Labs  Lab 05/01/20 0914  BNP 461.1*    DDimer No results for input(s): DDIMER in the last 168 hours.   Radiology  DG Chest 1 View  Result Date: 05/06/2020 CLINICAL DATA:  Dyspnea EXAM: CHEST  1 VIEW COMPARISON:  05/04/2020 chest radiograph. FINDINGS: Endotracheal tube tip is 4.8 cm above the carina. Enteric tube terminates in the distal stomach. Stable cardiomediastinal silhouette with mild cardiomegaly. No pneumothorax. Small bilateral pleural effusions, decreased bilaterally. Diffuse patchy parahilar fluffy and linear opacities, slightly improved. Stable left retrocardiac atelectasis. Improved right lung base aeration. IMPRESSION: 1. Well-positioned support structures. 2. Stable mild cardiomegaly with slightly improved diffuse patchy parahilar fluffy and linear opacities, favor improving cardiogenic pulmonary edema.  3. Small bilateral pleural effusions, decreased bilaterally. 4. Stable left retrocardiac atelectasis. Improved right lung base aeration. Electronically Signed  By: Ilona Sorrel M.D.   On: 05/06/2020 15:43    Cardiac Studies  TTE 04/13/2020 1. Left ventricular ejection fraction, by estimation, is 50%. The left  ventricle has mildly decreased function. The left ventricle demonstrates  global hypokinesis. Left ventricular diastolic parameters are consistent  with Grade I diastolic dysfunction  (impaired relaxation).  2. Right ventricular systolic function is normal. The right ventricular  size is normal. There is normal pulmonary artery systolic pressure. The  estimated right ventricular systolic pressure is 47.4 mmHg.  3. Left atrial size was moderately dilated.  4. Right atrial size was mildly dilated.  5. The mitral valve is normal in structure. No evidence of mitral valve  regurgitation. No evidence of mitral stenosis.  6. The aortic valve is tricuspid. Aortic valve regurgitation is trivial.  Mild aortic valve sclerosis is present, with no evidence of aortic valve  stenosis.  7. The inferior vena cava is normal in size with greater than 50%  respiratory variability, suggesting right atrial pressure of 3 mmHg.   Patient Profile  BLESS BELSHE is a 79 y.o. male with history of hypertension who was admitted for endovascular repair of AAA on 04/16/2020.  On 04/19/2020 he developed altered mental status and pneumonia as well as AKI.  This required intubation.  Also developed atrial fibrillation with RVR.  He underwent TEE/cardioversion on 04/26/2020.  He apparently had to go back to the ICU due to deterioration in his condition.  He has been in sinus rhythm since 05/02/2020.  He is on oral amiodarone.  Assessment & Plan   1.  Postoperative atrial fibrillation with RVR -Driven by his procedure, sepsis and pneumonia. -Underwent TEE/cardioversion on 04/26/2020.  He went back in atrial  fibrillation shortly after.  He was then placed on amiodarone.  Has been in sinus rhythm since 05/02/2020. -Echo with EF 50%. -He has been noticed to bradycardic.  I will reduce his amiodarone to 200 mg daily.  He has maintained sinus rhythm on this.  We will continue this.  He is received a tremendous amount of amiodarone and I suspect his bradycardia is likely related to this. -We will repeat EKG this morning.  Need to look at QT interval. -Ideally he would be on uninterrupted anticoagulation.  He will need a tracheostomy.  Okay to hold for 48 hours before procedure.  Would restart as able.  -In the interim, we will bridge with heparin drip.  I will discuss with vascular surgery if this is okay.  2.  Volume overload -Would continue with diuresis per critical care medicine.  Creatinine continues to improve.  For questions or updates, please contact [place] Please consult [email or web address] for contact info under    Time Spent with Patient: I have spent a total of 25 minutes with patient reviewing hospital notes, telemetry, EKGs, labs and examining the patient as well as establishing an assessment and plan that was discussed with the patient.  > 50% of time was spent in direct patient care.    Signed, Addison Naegeli. Audie Box, [place]  05/07/2020 9:02 AM

## 2020-05-07 NOTE — Progress Notes (Signed)
[place] for heparin  Indication: atrial fibrillation  Allergies  Allergen Reactions  . Dilaudid [Hydromorphone Hcl] Other (See Comments)    Oversedation  . Levaquin [Levofloxacin]     Aortic Aneurysm  . Sulfonamide Derivatives Itching  . Sulfasalazine Itching    Patient Measurements: Height: 6\' 3"  (190.5 cm) Weight: 102.9 kg (226 lb 13.7 oz) IBW/kg (Calculated) : 84.5  Heparin dosing weight - 102.9 kg   Vital Signs: Temp: 97.8 F (36.6 C) (11/08 1529) Temp Source: Oral (11/08 1529) BP: 153/94 (11/08 1900) Pulse Rate: 131 (11/08 1900)  Labs: Recent Labs    05/05/20 0203 05/05/20 0203 05/05/20 1643 05/06/20 1243 05/07/20 0435 05/07/20 1909  HGB 8.0*   < >  --  8.7* 8.4*  --   HCT 26.6*  --   --  28.8* 27.9*  --   PLT 304  --   --  320 230  --   APTT  --   --   --   --  26 53*  LABPROT  --   --   --   --  16.0*  --   INR  --   --   --   --  1.3*  --   CREATININE 2.51*   < > 2.28* 1.99* 1.85*  --    < > = values in this interval not displayed.    Estimated Creatinine Clearance: 41.4 mL/min (A) (by C-G formula based on SCr of 1.85 mg/dL (H)).   Medical History: Past Medical History:  Diagnosis Date  . AAA (abdominal aortic aneurysm) ([place])   . Atherosclerosis of aortic arch ([place]) 09/25/2015   By CT scan   . CAD (coronary artery disease) 09/25/2015   Mild 3v by CT scan   . Cancer [facility])    prostate  . Colon polyps   . COPD (chronic obstructive pulmonary disease) ([place])   . Diverticulosis of colon   . Femoral bruit    bilateral, found by Dr. Jefm Bryant  . Heart murmur    child  . History of prostate cancer   . IBS (irritable bowel syndrome)    improved after retirement  . 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 Pelvic fracture ([place])   . Pneumonia    child  . Pulmonary nodules 09/25/2015   On screening CT scan, rpt 1 yr   . Shortness of breath    doe     Assessment: 79  yo male with plans for tracheostomy later this week. His PTA apixaban for afib is on hold. S/p recent DCCV on 04/26/20 (last dose of apixaban was 11/7 in the evening). Pharmacy consulted to dose heparin.  APTT low this evening at 53. Hg low but stable at 8.4, plt wnl, SCr trend down to 1.85. No bleeding or issues with infusion per discussion with RN.  Goal of Therapy:  Heparin level 0.3-0.7 units/ml aPTT 66-102 seconds Monitor platelets by anticoagulation protocol: Yes   Plan:   Increase heparin to 2200 units/hr Recheck aPTT in 8 hours Monitor daily aPTT, heparin level, CBC, and s/sx bleeding   Arturo Morton, PharmD, BCPS Please check AMION for all [place] contact numbers Clinical Pharmacist 05/07/2020 7:45 PM

## 2020-05-07 NOTE — Progress Notes (Signed)
  Amiodarone Drug - Drug Interaction Consult Note  Recommendations: No major interactions noted.  Watch for muscle pain/weakness on statin.  Amiodarone is metabolized by the cytochrome P450 system and therefore has the potential to cause many drug interactions. Amiodarone has an average plasma half-life of 50 days (range 20 to 100 days).   There is potential for drug interactions to occur several weeks or months after stopping treatment and the onset of drug interactions may be slow after initiating amiodarone.   [x]  Statins: Increased risk of myopathy. Simvastatin- restrict dose to 20mg  daily. Other statins: counsel patients to report any muscle pain or weakness immediately. *Atorvastatin 10mg  daily  []  Anticoagulants: Amiodarone can increase anticoagulant effect. Consider warfarin dose reduction. Patients should be monitored closely and the dose of anticoagulant altered accordingly, remembering that amiodarone levels take several weeks to stabilize.  []  Antiepileptics: Amiodarone can increase plasma concentration of phenytoin, the dose should be reduced. Note that small changes in phenytoin dose can result in large changes in levels. Monitor patient and counsel on signs of toxicity.  []  Beta blockers: increased risk of bradycardia, AV block and myocardial depression. Sotalol - avoid concomitant use.  []   Calcium channel blockers (diltiazem and verapamil): increased risk of bradycardia, AV block and myocardial depression.  []   Cyclosporine: Amiodarone increases levels of cyclosporine. Reduced dose of cyclosporine is recommended.  []  Digoxin dose should be halved when amiodarone is started.  []  Diuretics: increased risk of cardiotoxicity if hypokalemia occurs.  []  Oral hypoglycemic agents (glyburide, glipizide, glimepiride): increased risk of hypoglycemia. Patient's glucose levels should be monitored closely when initiating amiodarone therapy.   []  Drugs that prolong the QT interval:   Torsades de pointes risk may be increased with concurrent use - avoid if possible.  Monitor QTc, also keep magnesium/potassium WNL if concurrent therapy can't be avoided. Marland Kitchen Antibiotics: e.g. fluoroquinolones, erythromycin. . Antiarrhythmics: e.g. quinidine, procainamide, disopyramide, sotalol. . Antipsychotics: e.g. phenothiazines, haloperidol.  . Lithium, tricyclic antidepressants, and methadone. Thank You,  Margretta Sidle Dohlen  05/07/2020 5:30 PM

## 2020-05-08 ENCOUNTER — Inpatient Hospital Stay (HOSPITAL_COMMUNITY): Payer: Medicare HMO

## 2020-05-08 DIAGNOSIS — I4819 Other persistent atrial fibrillation: Secondary | ICD-10-CM | POA: Diagnosis not present

## 2020-05-08 DIAGNOSIS — N170 Acute kidney failure with tubular necrosis: Secondary | ICD-10-CM | POA: Diagnosis not present

## 2020-05-08 LAB — RENAL FUNCTION PANEL
Albumin: 2.2 g/dL — ABNORMAL LOW (ref 3.5–5.0)
Anion gap: 10 (ref 5–15)
BUN: 51 mg/dL — ABNORMAL HIGH (ref 8–23)
CO2: 19 mmol/L — ABNORMAL LOW (ref 22–32)
Calcium: 8.2 mg/dL — ABNORMAL LOW (ref 8.9–10.3)
Chloride: 116 mmol/L — ABNORMAL HIGH (ref 98–111)
Creatinine, Ser: 1.76 mg/dL — ABNORMAL HIGH (ref 0.61–1.24)
GFR, Estimated: 39 mL/min — ABNORMAL LOW (ref >60)
Glucose, Bld: 117 mg/dL — ABNORMAL HIGH (ref 70–99)
Phosphorus: 3.4 mg/dL (ref 2.5–4.6)
Potassium: 4.1 mmol/L (ref 3.5–5.1)
Sodium: 145 mmol/L (ref 135–145)

## 2020-05-08 LAB — POCT I-STAT 7, (LYTES, BLD GAS, ICA,H+H)
Acid-base deficit: 6 mmol/L — ABNORMAL HIGH (ref 0.0–2.0)
Bicarbonate: 20.6 mmol/L (ref 20.0–28.0)
Calcium, Ion: 1.24 mmol/L (ref 1.15–1.40)
HCT: 33 % — ABNORMAL LOW (ref 39.0–52.0)
Hemoglobin: 11.2 g/dL — ABNORMAL LOW (ref 13.0–17.0)
O2 Saturation: 100 %
Patient temperature: 98.3
Potassium: 4.2 mmol/L (ref 3.5–5.1)
Sodium: 148 mmol/L — ABNORMAL HIGH (ref 135–145)
TCO2: 22 mmol/L (ref 22–32)
pCO2 arterial: 45.9 mmHg (ref 32.0–48.0)
pH, Arterial: 7.26 — ABNORMAL LOW (ref 7.350–7.450)
pO2, Arterial: 299 mmHg — ABNORMAL HIGH (ref 83.0–108.0)

## 2020-05-08 LAB — CBC WITH DIFFERENTIAL/PLATELET
Abs Immature Granulocytes: 0.11 10*3/uL — ABNORMAL HIGH (ref 0.00–0.07)
Basophils Absolute: 0.1 10*3/uL (ref 0.0–0.1)
Basophils Relative: 1 %
Eosinophils Absolute: 0.2 10*3/uL (ref 0.0–0.5)
Eosinophils Relative: 2 %
HCT: 29.7 % — ABNORMAL LOW (ref 39.0–52.0)
Hemoglobin: 9.1 g/dL — ABNORMAL LOW (ref 13.0–17.0)
Immature Granulocytes: 1 %
Lymphocytes Relative: 5 %
Lymphs Abs: 0.5 10*3/uL — ABNORMAL LOW (ref 0.7–4.0)
MCH: 31.3 pg (ref 26.0–34.0)
MCHC: 30.6 g/dL (ref 30.0–36.0)
MCV: 102.1 fL — ABNORMAL HIGH (ref 80.0–100.0)
Monocytes Absolute: 0.8 10*3/uL (ref 0.1–1.0)
Monocytes Relative: 8 %
Neutro Abs: 8.9 10*3/uL — ABNORMAL HIGH (ref 1.7–7.7)
Neutrophils Relative %: 83 %
Platelets: 332 10*3/uL (ref 150–400)
RBC: 2.91 MIL/uL — ABNORMAL LOW (ref 4.22–5.81)
RDW: 16.1 % — ABNORMAL HIGH (ref 11.5–15.5)
WBC: 10.6 10*3/uL — ABNORMAL HIGH (ref 4.0–10.5)
nRBC: 0 % (ref 0.0–0.2)

## 2020-05-08 LAB — GLUCOSE, CAPILLARY
Glucose-Capillary: 103 mg/dL — ABNORMAL HIGH (ref 70–99)
Glucose-Capillary: 113 mg/dL — ABNORMAL HIGH (ref 70–99)
Glucose-Capillary: 138 mg/dL — ABNORMAL HIGH (ref 70–99)
Glucose-Capillary: 120 mg/dL — ABNORMAL HIGH (ref 70–99)
Glucose-Capillary: 100 mg/dL — ABNORMAL HIGH (ref 70–99)

## 2020-05-08 LAB — APTT: aPTT: 65 seconds — ABNORMAL HIGH (ref 24–36)

## 2020-05-08 LAB — HEPARIN LEVEL (UNFRACTIONATED): Heparin Unfractionated: 0.92 IU/mL — ABNORMAL HIGH (ref 0.30–0.70)

## 2020-05-08 MED ORDER — SUFENTANIL CITRATE 50 MCG/ML IV SOLN
100.0000 ug | Freq: Once | INTRAVENOUS | Status: AC
  Administered 2020-05-09: 100 ug via INTRAVENOUS
  Filled 2020-05-08: qty 2

## 2020-05-08 MED ORDER — SUCCINYLCHOLINE CHLORIDE 20 MG/ML IJ SOLN: 100.0000 mg | Freq: Once | INTRAMUSCULAR | Status: AC

## 2020-05-08 MED ORDER — PROPOFOL 1000 MG/100ML IV EMUL
5.0000 ug/kg/min | INTRAVENOUS | Status: DC
  Administered 2020-05-09: 15 ug/kg/min via INTRAVENOUS
  Filled 2020-05-08: qty 100

## 2020-05-08 MED ORDER — FENTANYL CITRATE (PF) 100 MCG/2ML IJ SOLN: 50.0000 ug | Freq: Once | INTRAMUSCULAR | Status: AC

## 2020-05-08 MED ORDER — SUCCINYLCHOLINE CHLORIDE 200 MG/10ML IV SOSY
PREFILLED_SYRINGE | INTRAVENOUS | Status: AC
  Administered 2020-05-08: 100 mg via INTRAVENOUS
  Filled 2020-05-08: qty 10

## 2020-05-08 MED ORDER — FUROSEMIDE 10 MG/ML IJ SOLN
60.0000 mg | Freq: Once | INTRAMUSCULAR | Status: AC
  Administered 2020-05-08: 60 mg via INTRAVENOUS
  Filled 2020-05-08: qty 6

## 2020-05-08 MED ORDER — ROCURONIUM BROMIDE 10 MG/ML (PF) SYRINGE
PREFILLED_SYRINGE | INTRAVENOUS | Status: AC
  Filled 2020-05-08: qty 10

## 2020-05-08 MED ORDER — PHENYLEPHRINE HCL-NACL 10-0.9 MG/250ML-% IV SOLN
0.0000 ug/min | INTRAVENOUS | Status: DC
  Administered 2020-05-09: 50 ug/min via INTRAVENOUS
  Filled 2020-05-08: qty 250

## 2020-05-08 MED ORDER — FENTANYL CITRATE (PF) 100 MCG/2ML IJ SOLN
INTRAMUSCULAR | Status: AC
  Administered 2020-05-08: 50 ug via INTRAVENOUS
  Filled 2020-05-08: qty 2

## 2020-05-08 MED ORDER — METOPROLOL TARTRATE 25 MG PO TABS
25.0000 mg | ORAL_TABLET | Freq: Four times a day (QID) | ORAL | Status: DC
  Administered 2020-05-09 – 2020-05-12 (×13): 25 mg
  Filled 2020-05-08 (×15): qty 1

## 2020-05-08 MED ORDER — VECURONIUM BROMIDE 10 MG IV SOLR
10.0000 mg | Freq: Once | INTRAVENOUS | Status: AC
  Administered 2020-05-09: 10 mg via INTRAVENOUS
  Filled 2020-05-08: qty 10

## 2020-05-08 MED ORDER — METOPROLOL TARTRATE 25 MG PO TABS
25.0000 mg | ORAL_TABLET | Freq: Four times a day (QID) | ORAL | Status: DC
  Administered 2020-05-08: 25 mg via ORAL
  Filled 2020-05-08: qty 1

## 2020-05-08 MED ORDER — ETOMIDATE 2 MG/ML IV SOLN: 15.0000 mg | Freq: Once | INTRAVENOUS | Status: AC

## 2020-05-08 MED ORDER — MIDAZOLAM HCL 2 MG/2ML IJ SOLN
INTRAMUSCULAR | Status: AC
  Administered 2020-05-08: 1 mg via INTRAVENOUS
  Filled 2020-05-08: qty 4

## 2020-05-08 MED ORDER — ETOMIDATE 2 MG/ML IV SOLN
INTRAVENOUS | Status: AC
  Administered 2020-05-08: 15 mg via INTRAVENOUS
  Filled 2020-05-08: qty 20

## 2020-05-08 MED ORDER — MIDAZOLAM HCL 2 MG/2ML IJ SOLN: 1.0000 mg | Freq: Once | INTRAMUSCULAR | Status: AC

## 2020-05-08 NOTE — Progress Notes (Signed)
NAME:  Carlos Olson, MRN:  [id number], DOB:  21-Dec-1940, LOS: 21 ADMISSION DATE:  04/17/2020, CONSULTATION DATE: 10/21 REFERRING MD: Dr. Donzetta Matters, CHIEF COMPLAINT: AKI with mild hypoxia  Brief History   79 year old male who presented 10/18 for elective endovascular repair abdominal aortic aneurysm with Dr. Donzetta Matters. PCCM consulted 10/21 for assistance in medical management.  Hospital course complicated by AKI, metabolic encephalopathy, hypoxia/ RLL pneumonia, and hypotension. Treated in ICU for sepsis, AFRVR, underwent cardioversion, ABX course. Moved out of ICU. Ongoing course significant for AKI, AF, debility. Then 11/2 he became encephalopathic overnight and was transferred back to ICU. PCCM re-consulted.   Past Medical History  AAA History of prostate cancer Heart murmur Diverticulitis COPD CAD atherosclerosis  Significant Hospital Events   Admitted 10/18 for elective AAA repair 10/19 OR   Consults:  Cardiology PCCM Nephrology GI  Procedures:  Endovascular AAA repair 10/19  Significant Diagnostic Tests:  04/21/2020 CT A/P > Status post abdominal aortic repair with aortobifemoral bypass graft. Some stranding is noted in the operative site likely related to postoperative change. No sizable hematoma is seen. Diverticulosis without diverticulitis. Near complete consolidation of the right lower lobe. Small left pleural effusion is seen with left basilar atelectasis. 10/25 CTH > 1. Stable.  No acute intracranial abnormality. 2. Atrophy with chronic small vessel white matter ischemic disease.  Micro Data:  Blood 10/28 > negative Sputum 10/28 > few candida  Antimicrobials:  10/19 cefazolin 10/22 zosyn > 10/26 Vancomycin 10/29 > 11/2  Zosyn 10/29 > 11/3 Fluconazole 10/29 >>>  Interim history/subjective:  Trial of extubation yesterday which initially went well but then patient developed rapid atrial fibrillation became increasingly confused refused BiPAP overnight.  This morning  is in more respiratory distress with decreasing oxygen saturation.  Requesting to have no further interventions and to transition to comfort care.  Objective   Blood pressure (!) 152/93, pulse (!) 129, temperature 98.3 F (36.8 C), temperature source Oral, resp. rate (!) 40, height 6\' 3"  (1.905 m), weight 102.9 kg, SpO2 97 %.    Vent Mode: PRVC FiO2 (%):  [40 %-100 %] 50 % Set Rate:  [26 bmp] 26 bmp Vt Set:  [630 mL] 630 mL PEEP:  [5 cmH20-7 cmH20] 5 cmH20 Pressure Support:  [5 cmH20-7 cmH20] 7 cmH20 Plateau Pressure:  [17 cmH20-24 cmH20] 17 cmH20   Intake/Output Summary (Last 24 hours) at 05/08/2020 1117 Last data filed at 05/08/2020 0700 Gross per 24 hour  Intake 3501.05 ml  Output 1515 ml  Net 1986.05 ml   Filed Weights   05/06/20 0455 05/07/20 0323 05/07/20 1900  Weight: 101.9 kg 102.9 kg 102.9 kg   Examination: General: Elderly man lying in bed, in moderate respiratory distress HEENT: Dry MM. Small bore feeding tube noted to right nare.  Neuro: Awake follows commands but verbal responses are slow and sometimes inconsistent. CV: Regular rate and rhythm, no murmurs PULM: scattered rhonchi at lower lobes.  Moderate accessory muscle use GI: Soft, nontender, nondistended.  Midline incision > clean, dry  Extremities: trace edema  Resolved Hospital Problem list     Assessment & Plan:   Acute hypercarbic respiratory failure due to combination of oversedation from narcotics and respiratory muscle fatigue Hypotension due to hypovolemia. 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  Dr.Cain and I have spoken to the patient the presence of his daughter and he has agreed to  reintubation and tracheostomy which we have scheduled for tomorrow morning after 48-hour Eliquis washout. - Aggressive chest physiotherapy.  - Avoid all narcotics, but continued delirium and debility despite no narcotics for >48h.  - Delirium appears to to be driven by respiratory distress as previously suspected - Gentle diuresis. Creatinine didn't change significantly with IV  - Continue amiodarone -On IV heparin as a bridge. - Continue bronchodilators - Ensure enteral nutrition.   Best practice:  Diet: NPO/ EN via SBFT Pain/Anxiety/Delirium protocol (if indicated): Precedex to RASS -1. VAP protocol (if indicated):  DVT prophylaxis: Eliquis GI prophylaxis: PPI Glucose control: euglycemic on no coverage Mobility: push PT efforts Code Status: Full Family Communication: updated daughter at bedside with Dr Donzetta Matters. We agree that patient's condition is still recoverable, but he now has a substantial hill to climb to recovery due to delirium and deconditioning. Agreed that secure nutrition and early trach would provide best hope of recovery.  Disposition: ICU  Labs   CBC: Recent Labs  Lab 05/04/20 0049 05/04/20 0544 05/05/20 0203 05/06/20 1243 05/07/20 0435 05/08/20 0202 05/08/20 1059  WBC 11.4*  --  8.9 8.9 7.1 10.6*  --   NEUTROABS  --   --  7.3 7.4 5.4 8.9*  --   HGB 9.4*   < > 8.0* 8.7* 8.4* 9.1* 11.2*  HCT 30.8*   < > 26.6* 28.8* 27.9* 29.7* 33.0*  MCV 104.8*  --  105.1* 104.7* 104.5* 102.1*  --   PLT 455*  --  304 320 230 332  --    < > = values in this interval not displayed.    Basic Metabolic Panel: Recent Labs  Lab 05/01/20 1609 05/01/20 2145 05/02/20 0601 05/02/20 1534 05/03/20 0610 05/03/20 1618 05/04/20 0049 05/04/20 0544 05/05/20 0203 05/05/20 0203 05/05/20 1643 05/06/20 1243 05/07/20 0435 05/08/20 0202 05/08/20 1059  NA   < >  --  146*   < > 147*   < > 147*   < > 146*   < > 145 145 146* 145 148*  K   < >  --  3.4*   < > 3.6   < > 3.8   < > 4.1   <  > 3.9 4.3 4.4 4.1 4.2  CL   < >  --  113*   < > 116*   < > 115*   < > 116*  --  116* 116* 118* 116*  --   CO2   < >  --  22   < > 21*   < > 22   < > 22  --  21* 20* 20* 19*  --   GLUCOSE   < >  --  128*   < > 134*   < > 147*   < > 115*  --  123* 119* 109* 117*  --   BUN   < >  --  36*   < > 33*   < > 29*   < > 44*  --  44* 44* 49* 51*  --   CREATININE   < >  --  2.73*   < > 2.49*   < > 2.19*   < > 2.51*  --  2.28* 1.99* 1.85* 1.76*  --   CALCIUM   < >  --  8.1*   < > 8.2*   < >  8.4*   < > 8.0*  --  8.1* 8.2* 8.3* 8.2*  --   MG  --  1.6* 2.1  --  1.9  --  2.2  --  2.0  --   --   --   --   --   --   PHOS   < >  --  4.4   < > 3.4  3.5   < > 3.7  3.7   < > 3.4  --  3.5 3.9 3.8 3.4  --    < > = values in this interval not displayed.   GFR: Estimated Creatinine Clearance: 43.5 mL/min (A) (by C-G formula based on SCr of 1.76 mg/dL (H)). Recent Labs  Lab 05/02/20 0601 05/02/20 0601 05/03/20 0610 05/04/20 0049 05/05/20 0203 05/06/20 1243 05/07/20 0435 05/08/20 0202  PROCALCITON 0.31  --  0.13  --   --   --   --   --   WBC 7.9   < > 10.0   < > 8.9 8.9 7.1 10.6*   < > = values in this interval not displayed.    Liver Function Tests: Recent Labs  Lab 05/05/20 0203 05/05/20 1643 05/06/20 1243 05/07/20 0435 05/08/20 0202  ALBUMIN 1.9* 2.1* 2.3* 2.1* 2.2*   No results for input(s): LIPASE, AMYLASE in the last 168 hours. Recent Labs  Lab 05/03/20 1618  AMMONIA 22    ABG    Component Value Date/Time   PHART 7.260 (L) 05/08/2020 1059   PCO2ART 45.9 05/08/2020 1059   PO2ART 299 (H) 05/08/2020 1059   HCO3 20.6 05/08/2020 1059   TCO2 22 05/08/2020 1059   ACIDBASEDEF 6.0 (H) 05/08/2020 1059   O2SAT 100.0 05/08/2020 1059     Coagulation Profile: Recent Labs  Lab 05/07/20 0435  INR 1.3*    Cardiac Enzymes: No results for input(s): CKTOTAL, CKMB, CKMBINDEX, TROPONINI in the last 168 hours.  HbA1C: No results found for: HGBA1C  CBG: Recent Labs  Lab 05/07/20 1527  05/07/20 1955 05/07/20 2336 05/08/20 0330 05/08/20 0800  GLUCAP 104* 110* 120* 103* 113*   CRITICAL CARE Performed by: Kipp Brood   Total critical care time: 40 minutes  Critical care time was exclusive of separately billable procedures and treating other patients.  Critical care was necessary to treat or prevent imminent or life-threatening deterioration.  Critical care was time spent personally by me on the following activities: development of treatment plan with patient and/or surrogate as well as nursing, discussions with consultants, evaluation of patient's response to treatment, examination of patient, obtaining history from patient or surrogate, ordering and performing treatments and interventions, ordering and review of laboratory studies, ordering and review of radiographic studies, pulse oximetry, re-evaluation of patient's condition and participation in multidisciplinary rounds.  Kipp Brood, MD [facility] ICU Physician [place]  Pager: [phone] Mobile: [phone] After hours: [phone].

## 2020-05-08 NOTE — Progress Notes (Addendum)
[facility]  Subjective  - Working to breath refusing Bipap.  He wants to go home.   Objective (!) 154/80 (!) 121 99 F (37.2 C) (Axillary) (!) 36 97%  Intake/Output Summary (Last 24 hours) at 05/08/2020 0739 Last data filed at 05/08/2020 0600 Gross per 24 hour  Intake 3519.9 ml  Output 1515 ml  Net 2004.9 ml    Moving all 4 ext, feet warm and well perfused  Abdomin soft, incision healing well Alert and speaking well since extubation Heart Afib O2 [place] refusing CPAP  Assessment/Planning: 79 y.o.maleis aorto to right common femoral and left hypogastric and external iliac arteries bypass.  Lungs tachypneic, working to breath, able to speak.  Chest Xray pending today.  Extubated yesterday and refusing CPAP. Heparin full dose for Afib, Eliquis being held NG feeding tube in place Vol overload + 10,000, urine OP 665 last 24 hours.  Cr 1.76.  Last dose of Lasix 80 mg given 11/7   Roxy Horseman 05/08/2020 7:39 AM --  Laboratory Lab Results: Recent Labs    05/07/20 0435 05/08/20 0202  WBC 7.1 10.6*  HGB 8.4* 9.1*  HCT 27.9* 29.7*  PLT 230 332   BMET Recent Labs    05/07/20 0435 05/08/20 0202  NA 146* 145  K 4.4 4.1  CL 118* 116*  CO2 20* 19*  GLUCOSE 109* 117*  BUN 49* 51*  CREATININE 1.85* 1.76*  CALCIUM 8.3* 8.2*    COAG Lab Results  Component Value Date   INR 1.3 (H) 05/07/2020   INR 1.5 (H) 04/26/2020   INR 1.2 04/17/2020   No results found for: PTT  I have independently interviewed and examined patient and agree with PA assessment and plan above.  Unfortunately it does not appear that he is going to do well extubated.  I discussed with the patient and his daughter at the bedside that we will plan for reintubation today.  Last dose of Eliquis was Sunday evening.  We will plan for tracheostomy first thing tomorrow morning.  Patient is in agreement.  Alesa Echevarria C. Donzetta Matters, MD [facility] Office: [phone] Pager: [phone]

## 2020-05-08 NOTE — Progress Notes (Signed)
NAME:  Carlos Olson, MRN:  [id number], DOB:  Sep 16, 1940, LOS: 21 ADMISSION DATE:  04/17/2020, CONSULTATION DATE: 10/21 REFERRING MD: Dr. Donzetta Matters, CHIEF COMPLAINT: AKI with mild hypoxia  Brief History   79 year old male who presented 10/18 for elective endovascular repair abdominal aortic aneurysm with Dr. Donzetta Matters. PCCM consulted 10/21 for assistance in medical management.  Hospital course complicated by AKI, metabolic encephalopathy, hypoxia/ RLL pneumonia, and hypotension. Treated in ICU for sepsis, AFRVR, underwent cardioversion, ABX course. Moved out of ICU. Ongoing course significant for AKI, AF, debility. Then 11/2 he became encephalopathic overnight and was transferred back to ICU. PCCM re-consulted.   Past Medical History  AAA History of prostate cancer Heart murmur Diverticulitis COPD CAD atherosclerosis  Significant Hospital Events   Admitted 10/18 for elective AAA repair 10/19 OR   Consults:  Cardiology PCCM Nephrology GI  Procedures:  Endovascular AAA repair 10/19  Significant Diagnostic Tests:  04/21/2020 CT A/P > Status post abdominal aortic repair with aortobifemoral bypass graft. Some stranding is noted in the operative site likely related to postoperative change. No sizable hematoma is seen. Diverticulosis without diverticulitis. Near complete consolidation of the right lower lobe. Small left pleural effusion is seen with left basilar atelectasis. 10/25 CTH > 1. Stable.  No acute intracranial abnormality. 2. Atrophy with chronic small vessel white matter ischemic disease.  Micro Data:  Blood 10/28 > negative Sputum 10/28 > few candida  Antimicrobials:  10/19 cefazolin 10/22 zosyn > 10/26 Vancomycin 10/29 > 11/2  Zosyn 10/29 > 11/3 Fluconazole 10/29 >>>  Interim history/subjective:  Brighter yesterday and passed SBT after 2h.   Objective   Blood pressure (!) 152/93, pulse (!) 129, temperature 98.3 F (36.8 C), temperature source Oral, resp. rate (!) 40,  height 6\' 3"  (1.905 m), weight 102.9 kg, SpO2 97 %.    Vent Mode: PRVC FiO2 (%):  [40 %-100 %] 50 % Set Rate:  [26 bmp] 26 bmp Vt Set:  [630 mL] 630 mL PEEP:  [5 cmH20-7 cmH20] 5 cmH20 Pressure Support:  [5 cmH20-7 cmH20] 7 cmH20 Plateau Pressure:  [17 cmH20-24 cmH20] 17 cmH20   Intake/Output Summary (Last 24 hours) at 05/08/2020 1116 Last data filed at 05/08/2020 0700 Gross per 24 hour  Intake 3501.05 ml  Output 1515 ml  Net 1986.05 ml   Filed Weights   05/06/20 0455 05/07/20 0323 05/07/20 1900  Weight: 101.9 kg 102.9 kg 102.9 kg   Examination: General: Elderly man lying in bed, mild dyspnea when dropped to PSV 5/5/   HEENT: Dry MM. Small bore feeding tube noted to right nare.  Neuro: Sleepy, awakens with physical stimulation, follows simple comamnds. CV: Regular rate and rhythm, no murmurs PULM: scattered rhonchi at lower lobes. Mild accessory muscle use.   GI: Soft, nontender, nondistended.  Midline incision > clean, dry  Extremities: trace edema  Resolved Hospital Problem list     Assessment & Plan:   Acute hypercarbic respiratory failure due to combination of oversedation from narcotics and respiratory muscle fatigue Hypotension due to hypovolemia. 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  If fails will trach tomorrow.  - Aggressive chest physiotherapy.  - Avoid all narcotics, but continued delirium and debility despite no narcotics for >48h.  - Delirium appears to  have cleared. Watch for CO2 narcosis once extubated. May benefit from nocturnal BiPAP once extubated until respiratory muscle strength improves.  - Gentle diuresis. Creatinine didn't change significantly with  IV  - Continue amiodarone and apixiban enterally - Continue bronchodilators - Ensure enteral nutrition.   Best practice:  Diet: NPO/ EN via SBFT Pain/Anxiety/Delirium protocol (if indicated): Precedex to RASS -1. VAP protocol (if indicated):  DVT prophylaxis: Eliquis GI prophylaxis: PPI Glucose control: euglycemic on no coverage Mobility: push PT efforts Code Status: Full Family Communication: updated daughter at bedside with Dr Donzetta Matters. We agree that patient's condition is still recoverable, but he now has a substantial hill to climb to recovery due to delirium and deconditioning. Agreed that secure nutrition and early trach would provide best hope of recovery.  Disposition: ICU  Labs   CBC: Recent Labs  Lab 05/04/20 0049 05/04/20 0544 05/05/20 0203 05/06/20 1243 05/07/20 0435 05/08/20 0202 05/08/20 1059  WBC 11.4*  --  8.9 8.9 7.1 10.6*  --   NEUTROABS  --   --  7.3 7.4 5.4 8.9*  --   HGB 9.4*   < > 8.0* 8.7* 8.4* 9.1* 11.2*  HCT 30.8*   < > 26.6* 28.8* 27.9* 29.7* 33.0*  MCV 104.8*  --  105.1* 104.7* 104.5* 102.1*  --   PLT 455*  --  304 320 230 332  --    < > = values in this interval not displayed.    Basic Metabolic Panel: Recent Labs  Lab 05/01/20 1609 05/01/20 2145 05/02/20 0601 05/02/20 1534 05/03/20 0610 05/03/20 1618 05/04/20 0049 05/04/20 0544 05/05/20 0203 05/05/20 0203 05/05/20 1643 05/06/20 1243 05/07/20 0435 05/08/20 0202 05/08/20 1059  NA   < >  --  146*   < > 147*   < > 147*   < > 146*   < > 145 145 146* 145 148*  K   < >  --  3.4*   < > 3.6   < > 3.8   < > 4.1   < > 3.9 4.3 4.4 4.1 4.2  CL   < >  --  113*   < > 116*   < > 115*   < > 116*  --  116* 116* 118* 116*  --   CO2   < >  --  22   < > 21*   < > 22   < > 22  --  21* 20* 20* 19*  --   GLUCOSE   < >  --  128*   < > 134*   < > 147*   < > 115*  --  123* 119* 109* 117*  --   BUN   < >  --  36*   < > 33*   < > 29*   < > 44*  --  44* 44* 49* 51*  --   CREATININE   < >  --  2.73*   < > 2.49*    < > 2.19*   < > 2.51*  --  2.28* 1.99* 1.85* 1.76*  --   CALCIUM   < >  --  8.1*   < > 8.2*   < > 8.4*   < > 8.0*  --  8.1* 8.2* 8.3* 8.2*  --   MG  --  1.6* 2.1  --  1.9  --  2.2  --  2.0  --   --   --   --   --   --  PHOS   < >  --  4.4   < > 3.4  3.5   < > 3.7  3.7   < > 3.4  --  3.5 3.9 3.8 3.4  --    < > = values in this interval not displayed.   GFR: Estimated Creatinine Clearance: 43.5 mL/min (A) (by C-G formula based on SCr of 1.76 mg/dL (H)). Recent Labs  Lab 05/02/20 0601 05/02/20 0601 05/03/20 0610 05/04/20 0049 05/05/20 0203 05/06/20 1243 05/07/20 0435 05/08/20 0202  PROCALCITON 0.31  --  0.13  --   --   --   --   --   WBC 7.9   < > 10.0   < > 8.9 8.9 7.1 10.6*   < > = values in this interval not displayed.    Liver Function Tests: Recent Labs  Lab 05/05/20 0203 05/05/20 1643 05/06/20 1243 05/07/20 0435 05/08/20 0202  ALBUMIN 1.9* 2.1* 2.3* 2.1* 2.2*   No results for input(s): LIPASE, AMYLASE in the last 168 hours. Recent Labs  Lab 05/03/20 1618  AMMONIA 22    ABG    Component Value Date/Time   PHART 7.260 (L) 05/08/2020 1059   PCO2ART 45.9 05/08/2020 1059   PO2ART 299 (H) 05/08/2020 1059   HCO3 20.6 05/08/2020 1059   TCO2 22 05/08/2020 1059   ACIDBASEDEF 6.0 (H) 05/08/2020 1059   O2SAT 100.0 05/08/2020 1059     Coagulation Profile: Recent Labs  Lab 05/07/20 0435  INR 1.3*    Cardiac Enzymes: No results for input(s): CKTOTAL, CKMB, CKMBINDEX, TROPONINI in the last 168 hours.  HbA1C: No results found for: HGBA1C  CBG: Recent Labs  Lab 05/07/20 1527 05/07/20 1955 05/07/20 2336 05/08/20 0330 05/08/20 0800  GLUCAP 104* 110* 120* 103* 113*   CRITICAL CARE Performed by: Kipp Brood   Total critical care time: 40 minutes  Critical care time was exclusive of separately billable procedures and treating other patients.  Critical care was necessary to treat or prevent imminent or life-threatening deterioration.  Critical care  was time spent personally by me on the following activities: development of treatment plan with patient and/or surrogate as well as nursing, discussions with consultants, evaluation of patient's response to treatment, examination of patient, obtaining history from patient or surrogate, ordering and performing treatments and interventions, ordering and review of laboratory studies, ordering and review of radiographic studies, pulse oximetry, re-evaluation of patient's condition and participation in multidisciplinary rounds.  Kipp Brood, MD [facility] ICU Physician [place]  Pager: [phone] Mobile: [phone] After hours: [phone].

## 2020-05-08 NOTE — Progress Notes (Signed)
Cardiology Progress Note  Patient ID: Carlos Olson MRN: [id number] DOB: 1941/05/15 Date of Encounter: 05/08/2020  Primary Cardiologist: Werner Lean, MD  Subjective   Chief Complaint: Shortness of breath  HPI: Extubated yesterday.  Developed atrial fibrillation around 1700 hrs.  Noticeably tachypneic this morning with respiratory rates up in the 30-40 range.  Reports he is tired.  ROS:  All other ROS reviewed and negative. Pertinent positives noted in the HPI.     Inpatient Medications  Scheduled Meds:  acetaminophen  650 mg Per Tube Q6H   Or   acetaminophen  325-650 mg Rectal Q6H   amiodarone  200 mg Per Tube Daily   atorvastatin  10 mg Per Tube QHS   chlorhexidine  15 mL Mouth Rinse BID   Chlorhexidine Gluconate Cloth  6 each Topical Daily   darbepoetin (ARANESP) injection - NON-DIALYSIS  100 mcg Subcutaneous Q Mon-1800   feeding supplement (PROSource TF)  45 mL Per Tube TID   fiber  1 packet Per Tube BID   fluticasone furoate-vilanterol  1 puff Inhalation Daily   free water  300 mL Per Tube Q4H   ipratropium-albuterol  3 mL Nebulization TID   lidocaine  2 patch Transdermal Q24H   mouth rinse  15 mL Mouth Rinse q12n4p   pantoprazole sodium  40 mg Per Tube Daily   umeclidinium bromide  1 puff Inhalation Daily   [START ON 05/09/2020] vecuronium  10 mg Intravenous Once   Continuous Infusions:  sodium chloride Stopped (05/03/20 0058)   amiodarone 30 mg/hr (05/08/20 0600)   dexmedetomidine (PRECEDEX) IV infusion Stopped (05/07/20 1232)   feeding supplement (VITAL 1.5 CAL) 60 mL/hr at 05/07/20 0900   heparin 2,200 Units/hr (05/08/20 0600)   propofol (DIPRIVAN) infusion Stopped (05/07/20 1309)   PRN Meds: alum & mag hydroxide-simeth, bisacodyl, Gerhardt's butt cream, naLOXone (NARCAN)  injection, ondansetron, oxymetazoline   Vital Signs   Vitals:   05/08/20 0327 05/08/20 0400 05/08/20 0500 05/08/20 0600  BP:  (!) 147/83 (!) 156/78 (!)  154/80  Pulse:  (!) 119 (!) 119 (!) 121  Resp:  (!) 33 (!) 37 (!) 36  Temp: 99 F (37.2 C)     TempSrc: Axillary     SpO2:  97% 97% 97%  Weight:      Height:        Intake/Output Summary (Last 24 hours) at 05/08/2020 0650 Last data filed at 05/08/2020 0600 Gross per 24 hour  Intake 3558.42 ml  Output 1515 ml  Net 2043.42 ml   Last 3 Weights 05/07/2020 05/07/2020 05/06/2020  Weight (lbs) 226 lb 13.7 oz 226 lb 13.7 oz 224 lb 10.4 oz  Weight (kg) 102.9 kg 102.9 kg 101.9 kg      Telemetry  Overnight telemetry shows he developed atrial fibrillation around 1700 hrs. last night.  Atrial fibrillation has remained in the rates of 110 to 120 bpm, which I personally reviewed.   ECG  The most recent ECG shows sinus bradycardia, heart rate 59, QTC noted around 481, which I personally reviewed.   Physical Exam   Vitals:   05/08/20 0327 05/08/20 0400 05/08/20 0500 05/08/20 0600  BP:  (!) 147/83 (!) 156/78 (!) 154/80  Pulse:  (!) 119 (!) 119 (!) 121  Resp:  (!) 33 (!) 37 (!) 36  Temp: 99 F (37.2 C)     TempSrc: Axillary     SpO2:  97% 97% 97%  Weight:      Height:  Intake/Output Summary (Last 24 hours) at 05/08/2020 0650 Last data filed at 05/08/2020 0600 Gross per 24 hour  Intake 3558.42 ml  Output 1515 ml  Net 2043.42 ml    Last 3 Weights 05/07/2020 05/07/2020 05/06/2020  Weight (lbs) 226 lb 13.7 oz 226 lb 13.7 oz 224 lb 10.4 oz  Weight (kg) 102.9 kg 102.9 kg 101.9 kg    Body mass index is 28.35 kg/m.  General: Tachypnea noted, ill-appearing Head: Atraumatic, normal size  Eyes: PEERLA, EOMI  Neck: Supple, JVD 8 to 10 cm of water Endocrine: No thryomegaly Cardiac: Normal S1, S2; irregular rhythm, no murmurs Lungs: Diminished breath sounds bilaterally, diffuse rhonchi Abd: Soft, nontender, no hepatomegaly  Ext: Trace edema Musculoskeletal: No deformities, BUE and BLE strength normal and equal Skin: Warm and dry, no rashes   Neuro: Awake, alert, oriented to person and  place  Labs  High Sensitivity Troponin:  No results for input(s): TROPONINIHS in the last 720 hours.   Cardiac EnzymesNo results for input(s): TROPONINI in the last 168 hours. No results for input(s): TROPIPOC in the last 168 hours.  Chemistry Recent Labs  Lab 05/01/20 0914 05/01/20 1445 05/06/20 1243 05/07/20 0435 05/08/20 0202  NA 140   < > 145 146* 145  K 3.4*   < > 4.3 4.4 4.1  CL 110   < > 116* 118* 116*  CO2 18*   < > 20* 20* 19*  GLUCOSE 186*   < > 119* 109* 117*  BUN 35*   < > 44* 49* 51*  CREATININE 2.50*   < > 1.99* 1.85* 1.76*  CALCIUM 7.9*   < > 8.2* 8.3* 8.2*  PROT 5.2*  --   --   --   --   ALBUMIN 1.7*   < > 2.3* 2.1* 2.2*  AST 21  --   --   --   --   ALT 18  --   --   --   --   ALKPHOS 96  --   --   --   --   BILITOT 0.5  --   --   --   --   GFRNONAA 25*   < > 33* 36* 39*  ANIONGAP 12   < > 9 8 10    < > = values in this interval not displayed.    Hematology Recent Labs  Lab 05/06/20 1243 05/07/20 0435 05/08/20 0202  WBC 8.9 7.1 10.6*  RBC 2.75* 2.67* 2.91*  HGB 8.7* 8.4* 9.1*  HCT 28.8* 27.9* 29.7*  MCV 104.7* 104.5* 102.1*  MCH 31.6 31.5 31.3  MCHC 30.2 30.1 30.6  RDW 16.5* 16.4* 16.1*  PLT 320 230 332   BNP Recent Labs  Lab 05/01/20 0914  BNP 461.1*    DDimer No results for input(s): DDIMER in the last 168 hours.   Radiology  DG Chest 1 View  Result Date: 05/06/2020 CLINICAL DATA:  Dyspnea EXAM: CHEST  1 VIEW COMPARISON:  05/04/2020 chest radiograph. FINDINGS: Endotracheal tube tip is 4.8 cm above the carina. Enteric tube terminates in the distal stomach. Stable cardiomediastinal silhouette with mild cardiomegaly. No pneumothorax. Small bilateral pleural effusions, decreased bilaterally. Diffuse patchy parahilar fluffy and linear opacities, slightly improved. Stable left retrocardiac atelectasis. Improved right lung base aeration. IMPRESSION: 1. Well-positioned support structures. 2. Stable mild cardiomegaly with slightly improved diffuse  patchy parahilar fluffy and linear opacities, favor improving cardiogenic pulmonary edema. 3. Small bilateral pleural effusions, decreased bilaterally. 4. Stable left retrocardiac atelectasis. Improved right  lung base aeration. Electronically Signed   By: Ilona Sorrel M.D.   On: 05/06/2020 15:43    Cardiac Studies  TTE 04/13/2020 1. Left ventricular ejection fraction, by estimation, is 50%. The left  ventricle has mildly decreased function. The left ventricle demonstrates  global hypokinesis. Left ventricular diastolic parameters are consistent  with Grade I diastolic dysfunction  (impaired relaxation).  2. Right ventricular systolic function is normal. The right ventricular  size is normal. There is normal pulmonary artery systolic pressure. The  estimated right ventricular systolic pressure is 37.6 mmHg.  3. Left atrial size was moderately dilated.  4. Right atrial size was mildly dilated.  5. The mitral valve is normal in structure. No evidence of mitral valve  regurgitation. No evidence of mitral stenosis.  6. The aortic valve is tricuspid. Aortic valve regurgitation is trivial.  Mild aortic valve sclerosis is present, with no evidence of aortic valve  stenosis.  7. The inferior vena cava is normal in size with greater than 50%  respiratory variability, suggesting right atrial pressure of 3 mmHg.   Patient Profile  HAKIM MINNIEFIELD is a 79 y.o. male with hypertension who was admitted on 04/16/2020 for endovascular repair of his AAA.  Course has been complicated by pneumonia and AKI on 04/19/2020.  Also developed atrial fibrillation with RVR.  Underwent TEE/cardioversion on 04/26/2020.  Assessment & Plan   1.  Postop atrial fibrillation with RVR -Likely has been driven by sepsis and pneumonia.  Underwent TEE/cardioversion on 04/26/2020.  Went back in A. fib shortly after this.  Was on amiodarone.  Has been in normal sinus rhythm between 05/02/2020-05/07/2020. -He is gone back into  atrial fibrillation on 04/06/2020 around 1700.  He was extubated yesterday on 04/06/2020.  He is noticeably tachypneic with diffuse rhonchi.  I suspect his atrial fibrillation is driven by his respiratory status which is poor. -He has received a tremendous amount of amiodarone.  His QTC was around 480.  We will continue to 100 mg daily. -We will add rate control for now.  Metoprolol tartrate 25 mg every 6 hours. -We can possibly consider repeat cardioversion once he is out of the ICU and his respiratory status has improved. -He is on heparin drip.  We can transition to Eliquis when okay per critical care medicine team.  2.  Volume overload -Diuresis per critical care medicine  For questions or updates, please contact [place] Please consult [email or web address] for contact info under   Time Spent with Patient: I have spent a total of 25 minutes with patient reviewing hospital notes, telemetry, EKGs, labs and examining the patient as well as establishing an assessment and plan that was discussed with the patient.  > 50% of time was spent in direct patient care.    Signed, Addison Naegeli. Audie Box, [place]  05/08/2020 6:50 AM

## 2020-05-08 NOTE — Progress Notes (Signed)
PT Cancellation Note  Patient Details Name: Carlos Olson MRN: [id number] DOB: 26-Apr-1941   Cancelled Treatment:     pt reintubated and sedated, not medically appropriate to see   Lyanne Co, DPT Acute Rehabilitation Services [phone]   [place] 05/08/2020, 11:10 AM

## 2020-05-08 NOTE — Progress Notes (Signed)
[place] for heparin  Indication: atrial fibrillation  Allergies  Allergen Reactions  . Dilaudid [Hydromorphone Hcl] Other (See Comments)    Oversedation  . Levaquin [Levofloxacin]     Aortic Aneurysm  . Sulfonamide Derivatives Itching  . Sulfasalazine Itching    Patient Measurements: Height: 6\' 3"  (190.5 cm) Weight: 102.9 kg (226 lb 13.7 oz) IBW/kg (Calculated) : 84.5  Heparin dosing weight - 102.9 kg   Vital Signs: Temp: 98.3 F (36.8 C) (11/09 0759) Temp Source: Oral (11/09 0759) BP: 152/93 (11/09 0700) Pulse Rate: 129 (11/09 0700)  Labs: Recent Labs    05/06/20 1243 05/06/20 1243 05/07/20 0435 05/07/20 1909 05/08/20 0202  HGB 8.7*   < > 8.4*  --  9.1*  HCT 28.8*  --  27.9*  --  29.7*  PLT 320  --  230  --  332  APTT  --   --  26 53* 65*  LABPROT  --   --  16.0*  --   --   INR  --   --  1.3*  --   --   HEPARINUNFRC  --   --   --   --  0.92*  CREATININE 1.99*  --  1.85*  --  1.76*   < > = values in this interval not displayed.    Estimated Creatinine Clearance: 43.5 mL/min (A) (by C-G formula based on SCr of 1.76 mg/dL (H)).   Medical History: Past Medical History:  Diagnosis Date  . AAA (abdominal aortic aneurysm) ([place])   . Atherosclerosis of aortic arch ([place]) 09/25/2015   By CT scan   . CAD (coronary artery disease) 09/25/2015   Mild 3v by CT scan   . Cancer [facility])    prostate  . Colon polyps   . COPD (chronic obstructive pulmonary disease) ([place])   . Diverticulosis of colon   . Femoral bruit    bilateral, found by Dr. Jefm Bryant  . Heart murmur    child  . History of prostate cancer   . IBS (irritable bowel syndrome)    improved after retirement  . 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 Pelvic fracture ([place])   . Pneumonia    child  . Pulmonary nodules 09/25/2015   On screening CT scan, rpt 1 yr   . Shortness of breath    doe     Assessment: 79 yo male with  plans for tracheostomy later this week. His PTA apixaban for afib is on hold. S/p recent DCCV on 04/26/20 (last dose of apixaban was 11/7 in the evening). Pharmacy consulted to dose heparin. Plans noted for trach placement on 11/10 -aPTT= 65    Goal of Therapy:  Heparin level 0.3-0.7 units/ml aPTT 66-102 seconds Monitor platelets by anticoagulation protocol: Yes   Plan:   Increase heparin to 2250 units/hr Monitor daily aPTT, heparin level, CBC, and s/sx bleeding  Hildred Laser, PharmD Clinical Pharmacist **Pharmacist phone directory can now be found on [email or web address] ([email or web address]).  Listed under [place].

## 2020-05-08 NOTE — Progress Notes (Signed)
Placed pt on BiPAP QHS, pt very hesitant at first and was slightly combative with RT about wearing BiPAP. RT convinced pt to try BiPAP for the night and pt finally agreed. RN aware pt on BiPAP, RT to monitor as needed.

## 2020-05-08 NOTE — Evaluation (Signed)
Clinical/Bedside Swallow Evaluation Patient Details  Name: Carlos Olson MRN: [id number] Date of Birth: [birth year]  Today's Date: 05/08/2020 Time: SLP Start Time (ACUTE ONLY): 3976 SLP Stop Time (ACUTE ONLY): 0851 SLP Time Calculation (min) (ACUTE ONLY): 16 min  Past Medical History:  Past Medical History:  Diagnosis Date  . AAA (abdominal aortic aneurysm) ([place])   . Atherosclerosis of aortic arch ([place]) 09/25/2015   By CT scan   . CAD (coronary artery disease) 09/25/2015   Mild 3v by CT scan   . Cancer [facility])    prostate  . Colon polyps   . COPD (chronic obstructive pulmonary disease) ([place])   . Diverticulosis of colon   . Femoral bruit    bilateral, found by Dr. Jefm Bryant  . Heart murmur    child  . History of prostate cancer   . IBS (irritable bowel syndrome)    improved after retirement  . 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 Pelvic fracture ([place])   . Pneumonia    child  . Pulmonary nodules 09/25/2015   On screening CT scan, rpt 1 yr   . Shortness of breath    doe   Past Surgical History:  Past Surgical History:  Procedure Laterality Date  . ANTERIOR CERVICAL DECOMP/DISCECTOMY FUSION  07/29/2011   Procedure: ANTERIOR CERVICAL DECOMPRESSION/DISCECTOMY FUSION 1 LEVEL;  Surgeon: Jessy Oto, MD;  Location: [place];  Service: Orthopedics;  Laterality: N/A;  Anterior Cervical Discectomy and Fusion C3-4  . AORTA - BILATERAL FEMORAL ARTERY BYPASS GRAFT N/A 04/17/2020   Procedure: AORTA BIFEMORAL BYPASS GRAFT USING A HEMASHIELD GOLD BIFURCATED GRAFT;  Surgeon: Waynetta Sandy, MD;  Location: [place];  Service: Vascular;  Laterality: N/A;  . BUBBLE STUDY  04/26/2020   Procedure: BUBBLE STUDY;  Surgeon: Freada Bergeron, MD;  Location: [place];  Service: Cardiovascular;;  . CARDIOVERSION N/A 04/26/2020   Procedure: CARDIOVERSION;  Surgeon: Freada Bergeron, MD;  Location[facility] ENDOSCOPY;  Service: Cardiovascular;   Laterality: N/A;  . CATARACT EXTRACTION W/PHACO Right 07/10/2015   Procedure: CATARACT EXTRACTION PHACO AND INTRAOCULAR LENS PLACEMENT ([place]);  Surgeon: Birder Robson, MD;  Location: [facility] ORS;  Service: Ophthalmology;  Laterality: Right;  [place] 00:58   . CATARACT EXTRACTION W/PHACO Left 07/24/2015   Procedure: CATARACT EXTRACTION PHACO AND INTRAOCULAR LENS PLACEMENT (IOC);  Surgeon: Birder Robson, MD;  Location: [facility] ORS;  Service: Ophthalmology;  Laterality: Left;  [place] 00:38   . COLONOSCOPY  12/2007   [place], diverticulosis and polyp, rpt 5 yrs  . dental implants    . EMBOLIZATION Right 03/26/2020   Procedure: EMBOLIZATION;  Surgeon: Waynetta Sandy, MD;  Location: [place] [facility];  Service: Cardiovascular;  Laterality: Right;  . FORAMINOTOMY 2 LEVEL Left 10/2013   C4/5, C5/6 ([place])  . HERNIA REPAIR     right 05/1999, left 09/04/05  . JOINT REPLACEMENT Right    TKR  . KNEE ARTHROPLASTY Left 05/20/2017   Procedure: COMPUTER ASSISTED TOTAL KNEE ARTHROPLASTY;  Surgeon: Dereck Leep, MD;  Location: [facility] ORS;  Service: Orthopedics;  Laterality: Left;  . KNEE ARTHROSCOPY  09/1997   left  . POSTERIOR CERVICAL FUSION/FORAMINOTOMY N/A 11/22/2013   Procedure: LEFT C4-5 AND C5-6 FORAMINOTOMY;  Surgeon: Jessy Oto, MD;  Location: [place];  Service: Orthopedics;  Laterality: N/A;  . PROSTATECTOMY  08/1990  . 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 TEE WITHOUT CARDIOVERSION N/A 04/26/2020   Procedure: TRANSESOPHAGEAL ECHOCARDIOGRAM (TEE);  Surgeon: Freada Bergeron, MD;  Location: [facility] ENDOSCOPY;  Service: Cardiovascular;  Laterality: N/A;  . TONSILLECTOMY     HPI:  79 year old male who presented 10/18 for elective endovascular repair abdominal aortic aneurysm. Hospital course complicated by AKI, metabolic encephalopathy, acute hypercarbic respiratory failure, hypoxia/ RLL pneumonia, and hypotension. Treated in ICU for sepsis, AFRVR, underwent cardioversion, ABX course. SLP ordered 10/28  given pt coughing with PO liquid intake. MBS 10/29: mild oropharyngeal dysphagia characterized by reduced bolus propulsion, reduced lingual retraction, reduced pharyngeal constriction, and reduced anterior laryngeal movement. Recommended dys 3 diet with thin liquid. Pt became obtunded on 11/2 and was transferred back to ICU. Pt made NPO and Cortrak placed on 11/3, SLP recommended pt remain NPO with water protocol. Pt reintubated 11/5-11/8. CXR 11/9 consistent with previous small bilateral pleural effusions and right lung base aeration.   Assessment / Plan / Recommendation Clinical Impression  Pt was seen for BSE after recent extubation. Pt demonstrates a baseline congested cough and increased work of breathing. Thick, blood-tinged secretions were suctioned from the oral cavity. He tolerated POs of ice chips and peaches without overt s/sx of aspiration, but prolonged oral transit and a suspected delayed swallow initiation were observed. MD entered the room during the session and began discussions with the pt and family member regarding the plan of care. Pt was reintubated shortly after. SLP will sign-off for now, please reorder when pt is medically ready.  SLP Visit Diagnosis: Dysphagia, unspecified (R13.10)    Aspiration Risk  Moderate aspiration risk    Diet Recommendation NPO;Alternative means - temporary   Medication Administration: Via alternative means    Other  Recommendations Oral Care Recommendations: Oral care BID   Follow up Recommendations        Frequency and Duration            Prognosis        Swallow Study   General HPI: 79 year old male who presented 10/18 for elective endovascular repair abdominal aortic aneurysm. Hospital course complicated by AKI, metabolic encephalopathy, acute hypercarbic respiratory failure, hypoxia/ RLL pneumonia, and hypotension. Treated in ICU for sepsis, AFRVR, underwent cardioversion, ABX course. Pt became obtunded on 11/2 and was transferred  back to ICU and Cortrak placed on 11/3. Pt reintubated 11/5-11/8. CXR 11/9 consistent with previous small bilateral pleural effusions and right lung base aeration. Type of Study: Bedside Swallow Evaluation Previous Swallow Assessment: BSE on 10/28 Diet Prior to this Study: NPO;NG Tube Temperature Spikes Noted: No Respiratory Status: Nasal cannula History of Recent Intubation: Yes Length of Intubations (days): 3 days Date extubated: 05/07/20 Behavior/Cognition: Alert;Uncooperative Oral Cavity Assessment: Within Functional Limits Oral Care Completed by SLP: No Oral Cavity - Dentition: Dentures, top;Dentures, bottom Vision: Functional for self-feeding Self-Feeding Abilities: Needs assist Patient Positioning: Upright in bed Baseline Vocal Quality: Normal Volitional Cough: Congested    Oral/Motor/Sensory Function Overall Oral Motor/Sensory Function: Within functional limits   Ice Chips Ice chips: Impaired Presentation: Spoon Oral Phase Functional Implications: Prolonged oral transit Pharyngeal Phase Impairments: Suspected delayed Swallow   Thin Liquid Thin Liquid: Not tested    Nectar Thick Nectar Thick Liquid: Not tested   Honey Thick Honey Thick Liquid: Not tested   Puree Puree: Not tested   Solid     Solid: Impaired Presentation: Spoon Oral Phase Functional Implications: Prolonged oral transit Pharyngeal Phase Impairments: Suspected delayed Swallow      Greggory Keen 05/08/2020,9:37 AM

## 2020-05-09 ENCOUNTER — Inpatient Hospital Stay (HOSPITAL_COMMUNITY): Payer: Medicare HMO

## 2020-05-09 DIAGNOSIS — I4819 Other persistent atrial fibrillation: Secondary | ICD-10-CM | POA: Diagnosis not present

## 2020-05-09 LAB — RENAL FUNCTION PANEL
Albumin: 1.9 g/dL — ABNORMAL LOW (ref 3.5–5.0)
Anion gap: 10 (ref 5–15)
BUN: 55 mg/dL — ABNORMAL HIGH (ref 8–23)
CO2: 18 mmol/L — ABNORMAL LOW (ref 22–32)
Calcium: 8.2 mg/dL — ABNORMAL LOW (ref 8.9–10.3)
Chloride: 115 mmol/L — ABNORMAL HIGH (ref 98–111)
Creatinine, Ser: 1.84 mg/dL — ABNORMAL HIGH (ref 0.61–1.24)
GFR, Estimated: 37 mL/min — ABNORMAL LOW (ref >60)
Glucose, Bld: 123 mg/dL — ABNORMAL HIGH (ref 70–99)
Phosphorus: 4 mg/dL (ref 2.5–4.6)
Potassium: 4 mmol/L (ref 3.5–5.1)
Sodium: 143 mmol/L (ref 135–145)

## 2020-05-09 LAB — URINALYSIS, COMPLETE (UACMP) WITH MICROSCOPIC: WBC, UA: >50 WBC/hpf (ref 0–5)

## 2020-05-09 LAB — CBC WITH DIFFERENTIAL/PLATELET
Abs Immature Granulocytes: 0.06 10*3/uL (ref 0.00–0.07)
Basophils Absolute: 0 10*3/uL (ref 0.0–0.1)
Basophils Relative: 1 %
Eosinophils Absolute: 0.3 10*3/uL (ref 0.0–0.5)
Eosinophils Relative: 3 %
HCT: 25.3 % — ABNORMAL LOW (ref 39.0–52.0)
Hemoglobin: 7.5 g/dL — ABNORMAL LOW (ref 13.0–17.0)
Immature Granulocytes: 1 %
Lymphocytes Relative: 7 %
Lymphs Abs: 0.6 10*3/uL — ABNORMAL LOW (ref 0.7–4.0)
MCH: 30.7 pg (ref 26.0–34.0)
MCHC: 29.6 g/dL — ABNORMAL LOW (ref 30.0–36.0)
MCV: 103.7 fL — ABNORMAL HIGH (ref 80.0–100.0)
Monocytes Absolute: 0.7 10*3/uL (ref 0.1–1.0)
Monocytes Relative: 9 %
Neutro Abs: 6.9 10*3/uL (ref 1.7–7.7)
Neutrophils Relative %: 79 %
Platelets: 223 10*3/uL (ref 150–400)
RBC: 2.44 MIL/uL — ABNORMAL LOW (ref 4.22–5.81)
RDW: 16.2 % — ABNORMAL HIGH (ref 11.5–15.5)
WBC: 8.6 10*3/uL (ref 4.0–10.5)
nRBC: 0 % (ref 0.0–0.2)

## 2020-05-09 LAB — GLUCOSE, CAPILLARY
Glucose-Capillary: 100 mg/dL — ABNORMAL HIGH (ref 70–99)
Glucose-Capillary: 101 mg/dL — ABNORMAL HIGH (ref 70–99)
Glucose-Capillary: 104 mg/dL — ABNORMAL HIGH (ref 70–99)
Glucose-Capillary: 112 mg/dL — ABNORMAL HIGH (ref 70–99)
Glucose-Capillary: 114 mg/dL — ABNORMAL HIGH (ref 70–99)
Glucose-Capillary: 92 mg/dL (ref 70–99)

## 2020-05-09 LAB — TRIGLYCERIDES: Triglycerides: 85 mg/dL (ref <150)

## 2020-05-09 LAB — HEPARIN LEVEL (UNFRACTIONATED): Heparin Unfractionated: 0.72 IU/mL — ABNORMAL HIGH (ref 0.30–0.70)

## 2020-05-09 LAB — APTT: aPTT: 118 seconds — ABNORMAL HIGH (ref 24–36)

## 2020-05-09 MED ORDER — HEPARIN (PORCINE) 25000 UT/250ML-% IV SOLN
2200.0000 [IU]/h | INTRAVENOUS | Status: DC
  Administered 2020-05-09 (×2): 2000 [IU]/h via INTRAVENOUS
  Filled 2020-05-09: qty 250

## 2020-05-09 MED ORDER — CHLORHEXIDINE GLUCONATE 0.12% ORAL RINSE (MEDLINE KIT)
15.0000 mL | Freq: Two times a day (BID) | OROMUCOSAL | Status: DC
  Administered 2020-05-09 – 2020-05-24 (×31): 15 mL via OROMUCOSAL

## 2020-05-09 MED ORDER — ORAL CARE MOUTH RINSE
15.0000 mL | OROMUCOSAL | Status: DC
  Administered 2020-05-09 – 2020-05-25 (×138): 15 mL via OROMUCOSAL

## 2020-05-09 MED ORDER — PHENYLEPHRINE 40 MCG/ML (10ML) SYRINGE FOR IV PUSH (FOR BLOOD PRESSURE SUPPORT)
PREFILLED_SYRINGE | INTRAVENOUS | Status: AC
  Administered 2020-05-09: 400 ug
  Filled 2020-05-09: qty 10

## 2020-05-09 MED ORDER — STERILE WATER FOR INJECTION IJ SOLN
INTRAMUSCULAR | Status: AC
  Administered 2020-05-09: 10 mL
  Filled 2020-05-09: qty 10

## 2020-05-09 MED ORDER — LIDOCAINE HCL URETHRAL/MUCOSAL 2 % EX GEL
1.0000 "application " | Freq: Once | CUTANEOUS | Status: DC
  Filled 2020-05-09: qty 11

## 2020-05-09 NOTE — Progress Notes (Signed)
elink notified of patients HR and transition from SR to afib, in addition he has become more agitated and slightly more confused as the evening has progressed Awaiting orders at this time.

## 2020-05-09 NOTE — Progress Notes (Signed)
Pt placed on PSV 8/5 and is tolerating well at this time. RN aware. RT to continue to monitor.

## 2020-05-09 NOTE — Procedures (Signed)
Percutaneous Tracheostomy Procedure Note   Carlos Olson  [id number]  01-01-1941  Date:05/09/20  Time:11:32 AM   Provider Performing:Pete E Kary Kos  Procedure: Percutaneous Tracheostomy with Bronchoscopic Guidance (31600)  Indication(s) Failure to wean and failed extubation x 2  Consent Risks of the procedure as well as the alternatives and risks of each were explained to the patient and/or caregiver.  Consent for the procedure was obtained.  Anesthesia Etomidate, Versed, Fentanyl, Vecuronium   Time Out Verified patient identification, verified procedure, site/side was marked, verified correct patient position, special equipment/implants available, medications/allergies/relevant history reviewed, required imaging and test results available.   Sterile Technique Maximal sterile technique including sterile barrier drape, hand hygiene, sterile gown, sterile gloves, mask, hair covering.    Procedure Description Appropriate anatomy identified by palpation.  Patient's neck prepped and draped in sterile fashion.  1% lidocaine with epinephrine was used to anesthetize skin overlying neck.  1.5cm incision made and blunt dissection performed until tracheal rings could be easily palpated.   Then a size 6 Shiley tracheostomy was placed under bronchoscopic visualization using usual Seldinger technique and serial dilation.   Bronchoscope confirmed placement above the carina.  Tracheostomy was sutured in place with adhesive pad to protect skin under pressure.    Patient connected to ventilator.   Complications/Tolerance None; patient tolerated the procedure well. Chest X-ray is ordered to confirm no post-procedural complication.   EBL Minimal   Specimen(s) None  Erick Colace ACNP-BC [place] Pager # [phone] OR # [phone] if no answer

## 2020-05-09 NOTE — Progress Notes (Addendum)
Progress Note    05/09/2020 7:37 AM 13 Days Post-Op  Subjective:  Intubated but shaking head yes and no to answer questions   Vitals:   05/09/20 0500 05/09/20 0600  BP: (!) 107/53 (!) 110/57  Pulse: 60 60  Resp: (!) 23 (!) 26  Temp:    SpO2: 95% 97%   Physical Exam: Lungs:  Mechanical ventilation Incisions:  abd and R groin incision healed Extremities:  Feet symmetrically warm to touch; BLE still edematous Abdomen:  Soft, NT, ND Neurologic: alert; following commands  CBC    Component Value Date/Time   WBC 8.6 05/09/2020 0213   RBC 2.44 (L) 05/09/2020 0213   HGB 7.5 (L) 05/09/2020 0213   HGB 12.1 (L) 08/20/2012 0610   HCT 25.3 (L) 05/09/2020 0213   HCT 44.0 08/02/2012 0853   PLT 223 05/09/2020 0213   PLT 158 08/20/2012 0610   MCV 103.7 (H) 05/09/2020 0213   MCV 99 08/02/2012 0853   MCH 30.7 05/09/2020 0213   MCHC 29.6 (L) 05/09/2020 0213   RDW 16.2 (H) 05/09/2020 0213   RDW 13.7 08/02/2012 0853   LYMPHSABS 0.6 (L) 05/09/2020 0213   MONOABS 0.7 05/09/2020 0213   EOSABS 0.3 05/09/2020 0213   BASOSABS 0.0 05/09/2020 0213    BMET    Component Value Date/Time   NA 143 05/09/2020 0213   NA 136 08/20/2012 0610   K 4.0 05/09/2020 0213   K 4.2 08/20/2012 0610   CL 115 (H) 05/09/2020 0213   CL 102 08/20/2012 0610   CO2 18 (L) 05/09/2020 0213   CO2 26 08/20/2012 0610   GLUCOSE 123 (H) 05/09/2020 0213   GLUCOSE 123 (H) 08/20/2012 0610   BUN 55 (H) 05/09/2020 0213   BUN 7 08/20/2012 0610   CREATININE 1.84 (H) 05/09/2020 0213   CREATININE 0.75 08/20/2012 0610   CALCIUM 8.2 (L) 05/09/2020 0213   CALCIUM 8.4 (L) 08/20/2012 0610   GFRNONAA 37 (L) 05/09/2020 0213   GFRNONAA >60 08/20/2012 0610   GFRAA >60 05/07/2017 0949   GFRAA >60 08/20/2012 0610    INR    Component Value Date/Time   INR 1.3 (H) 05/07/2020 0435   INR 1.0 08/02/2012 0853     Intake/Output Summary (Last 24 hours) at 05/09/2020 0737 Last data filed at 05/09/2020 0700 Gross per 24 hour   Intake 2589.49 ml  Output 3045 ml  Net -455.51 ml     Assessment/Plan:  79 y.o. 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  Patient is status post trach he remains sedated from the procedure.  Noted color of urine with reported smell of stool.  I have ordered urinalysis and discuss with urology with plans for noncontrasted CT scan.  Previous urinalysis 2 weeks ago did not demonstrate any bacteria was cloudy at the time.  Otherwise plan remains the same wean from ventilator as tolerated now that he is trached.  Makaiah Terwilliger C. Donzetta Matters, MD [facility]  [facility] [place] Office: [phone] Pager: [phone]

## 2020-05-09 NOTE — Progress Notes (Signed)
NAME:  MONIQUE GIFT, MRN:  [id number], DOB:  [birth year], LOS: 53 ADMISSION DATE:  04/17/2020, CONSULTATION DATE: 10/21 REFERRING MD: Dr. Donzetta Matters, CHIEF COMPLAINT: AKI with mild hypoxia  Brief History   79 year old male who presented 10/18 for elective endovascular repair abdominal aortic aneurysm with Dr. Donzetta Matters. PCCM consulted 10/21 for assistance in medical management.  Hospital course complicated by AKI, metabolic encephalopathy, hypoxia/ RLL pneumonia, and hypotension. Treated in ICU for sepsis, AFRVR, underwent cardioversion, ABX course. Moved out of ICU. Ongoing course significant for AKI, AF, debility. Then 11/2 he became encephalopathic overnight and was transferred back to ICU. PCCM re-consulted.   Past Medical History  AAA History of prostate cancer Heart murmur Diverticulitis COPD CAD atherosclerosis  Significant Hospital Events   Admitted 10/18 for elective AAA repair 10/19 OR   Consults:  Cardiology PCCM Nephrology GI  Procedures:  Endovascular AAA repair 10/19  Significant Diagnostic Tests:  04/21/2020 CT A/P > Status post abdominal aortic repair with aortobifemoral bypass graft. Some stranding is noted in the operative site likely related to postoperative change. No sizable hematoma is seen. Diverticulosis without diverticulitis. Near complete consolidation of the right lower lobe. Small left pleural effusion is seen with left basilar atelectasis. 10/25 CTH > 1. Stable.  No acute intracranial abnormality. 2. Atrophy with chronic small vessel white matter ischemic disease.  Micro Data:  Blood 10/28 > negative Sputum 10/28 > few candida  Antimicrobials:  10/19 cefazolin 10/22 zosyn > 10/26 Vancomycin 10/29 > 11/2  Zosyn 10/29 > 11/3 Fluconazole 10/29 >>>  Interim history/subjective:  Appears comfortable  Objective   Blood pressure (Abnormal) 110/57, pulse 60, temperature 98.7 F (37.1 C), temperature source Oral, resp. rate (Abnormal) 25, height 6\' 3"   (1.905 m), weight 97.7 kg, SpO2 97 %.    Vent Mode: PRVC FiO2 (%):  [40 %-100 %] 40 % Set Rate:  [26 bmp] 26 bmp Vt Set:  [630 mL] 630 mL PEEP:  [5 cmH20] 5 cmH20 Plateau Pressure:  [14 cmH20-24 cmH20] 14 cmH20   Intake/Output Summary (Last 24 hours) at 05/09/2020 0906 Last data filed at 05/09/2020 0700 Gross per 24 hour  Intake 2589.49 ml  Output 3045 ml  Net -455.51 ml   Filed Weights   05/07/20 0323 05/07/20 1900 05/09/20 0500  Weight: 102.9 kg 102.9 kg 97.7 kg   Examination: General this is a pleasant 79 year old white male resting in bed HENT NCAT no JVD MMM OETT #8 pulm clear bilaterally  Card RRR abd soft Ext warm and dry  Neuro intact  gu cl yellow   Resolved Hospital Problem list   Hypotension due to hypovolemia  RLL pneumonia Assessment & Plan:   Acute hypercarbic respiratory failure due to combination of oversedation from narcotics and respiratory muscle fatigue: failed extubation trial.  Plan Trach today  Cont full vent support w/ daily assessment for SBT. He may require nocturnal vent for some time given his h/o respiratory fatigue  VAP bundle  PAD protocol  RASS goal 0 s/p trach Chest PT Mobilization s/p trach  History of COPD; FEV1 65% of predicted H/o tobacco abuse  Plan Cont tid duoneb Cont daily Breo ellipta & Incruse   Acute metabolic AND toxic encephalopathy Seemingly significantly impacted by hypercarbia and also narcotic sensitivity  Plan Avoiding narcotics Cont precedex for now Try to taper after trach   AKI->renal fxn sitting in the 1.8 range. Did bump a little w/ diuretics. 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 VAP protocol (if indicated):  DVT prophylaxis: Eliquis GI prophylaxis: PPI Glucose control: euglycemic on no coverage Mobility: push PT efforts Code Status: Full Family Communication:updated daily  My cct 64 min  Erick Colace ACNP-BC [place] Pager # [phone] OR # [phone] if no answer

## 2020-05-09 NOTE — Procedures (Signed)
Diagnostic Bronchoscopy  HAZEN BRUMETT  [id number]  04-Jan-1941  Date:05/09/20  Time:11:51 AM   Provider Performing:Ugochukwu Chichester   Procedure: Diagnostic Bronchoscopy (62229)  Indication(s) Assist with direct visualization of tracheostomy placement  Consent Risks of the procedure as well as the alternatives and risks of each were explained to the patient and/or caregiver.  Consent for the procedure was obtained.   Anesthesia See separate tracheostomy note   Time Out Verified patient identification, verified procedure, site/side was marked, verified correct patient position, special equipment/implants available, medications/allergies/relevant history reviewed, required imaging and test results available.   Sterile Technique Usual hand hygiene, masks, gowns, and gloves were used   Procedure Description Bronchoscope advanced through endotracheal tube and into airway.  After suctioning out tracheal secretions, bronchoscope used to provide direct visualization of tracheostomy placement.   Complications/Tolerance None; patient tolerated the procedure well.   EBL None  Specimen(s) None  Kipp Brood, MD [facility] ICU Physician [place]  Pager: [phone] Mobile: [phone] After hours: [phone].  05/09/2020, 11:52 AM

## 2020-05-09 NOTE — Progress Notes (Signed)
Pt placed back on full vent support due to CT trip. RN aware. Pt weaned for 2hrs.

## 2020-05-09 NOTE — Progress Notes (Signed)
[place] for heparin  Indication: atrial fibrillation  Allergies  Allergen Reactions  . Dilaudid [Hydromorphone Hcl] Other (See Comments)    Oversedation  . Levaquin [Levofloxacin]     Aortic Aneurysm  . Sulfonamide Derivatives Itching  . Sulfasalazine Itching    Patient Measurements: Height: 6\' 3"  (190.5 cm) Weight: 97.7 kg (215 lb 6.2 oz) IBW/kg (Calculated) : 84.5  Heparin dosing weight - 102.9 kg   Vital Signs: Temp: 98.7 F (37.1 C) (11/10 0817) Temp Source: Oral (11/10 0817) BP: 100/45 (11/10 1300) Pulse Rate: 78 (11/10 1506)  Labs: Recent Labs    05/07/20 0435 05/07/20 0435 05/07/20 1909 05/08/20 0202 05/08/20 0202 05/08/20 1059 05/09/20 0213  HGB 8.4*   < >  --  9.1*   < > 11.2* 7.5*  HCT 27.9*   < >  --  29.7*  --  33.0* 25.3*  PLT 230  --   --  332  --   --  223  APTT 26   < > 53* 65*  --   --  118*  LABPROT 16.0*  --   --   --   --   --   --   INR 1.3*  --   --   --   --   --   --   HEPARINUNFRC  --   --   --  0.92*  --   --  0.72*  CREATININE 1.85*  --   --  1.76*  --   --  1.84*   < > = values in this interval not displayed.    Estimated Creatinine Clearance: 38.3 mL/min (A) (by C-G formula based on SCr of 1.84 mg/dL (H)).  Assessment: 79 y.o. male with h/o Afib, Eliquis on hold and pharmacy dosing heparin. He is s/p trach and heparin to restart at 4pm -prior rate was 2000 units/hr -hg= 7.5  Goal of Therapy:  Heparin level 0.3-0.7 units/ml aPTT 66-102 seconds Monitor platelets by anticoagulation protocol: Yes   Plan:   -Restart heparin at 2000 units/hr at 4pm -Heparin level in 8 hours and daily wth CBC daily  Hildred Laser, PharmD Clinical Pharmacist **Pharmacist phone directory can now be found on [email or web address] ([email or web address]).  Listed under [place].

## 2020-05-09 NOTE — Procedures (Addendum)
Bedside Tracheostomy Insertion Procedure Note   Patient Details:   Name: Carlos Olson DOB: September 18, 1940 MRN: [id number]  Procedure: Tracheostomy  Pre Procedure Assessment: ET Tube Size:8 ET Tube secured at lip (cm):26 Bite block in place: No Breath Sounds: Clear  Post Procedure Assessment: BP (!) 79/40   Pulse (!) 49   Temp 98.7 F (37.1 C) (Oral)   Resp (!) 26   Ht 6\' 3"  (1.905 m)   Wt 97.7 kg   SpO2 99%   BMI 26.92 kg/m  O2 sats: stable throughout Complications: No apparent complications Patient did tolerate procedure well Tracheostomy Brand:Shiley Tracheostomy Style:Cuffed Tracheostomy Size: 6 Tracheostomy Secured REV:[id number] Tracheostomy Placement Confirmation:Trach cuff visualized and in place    Vilinda Blanks 05/09/2020, 11:35 AM

## 2020-05-09 NOTE — Progress Notes (Signed)
Cardiology Progress Note  Patient ID: Carlos Olson MRN: [id number] DOB: 01-24-1941 Date of Encounter: 05/09/2020  Primary Cardiologist: Werner Lean, MD  Subjective   Chief Complaint: None. Intubated on vent.  HPI: Reintubated yesterday. Back in sinus rhythm. Plan for tracheostomy today  ROS:  All other ROS reviewed and negative. Pertinent positives noted in the HPI.     Inpatient Medications  Scheduled Meds: . acetaminophen  650 mg Per Tube Q6H   Or  . acetaminophen  325-650 mg Rectal Q6H  . amiodarone  200 mg Per Tube Daily  . atorvastatin  10 mg Per Tube QHS  . chlorhexidine gluconate (MEDLINE KIT)  15 mL Mouth Rinse BID  . Chlorhexidine Gluconate Cloth  6 each Topical Daily  . darbepoetin (ARANESP) injection - NON-DIALYSIS  100 mcg Subcutaneous Q Mon-1800  . feeding supplement (PROSource TF)  45 mL Per Tube TID  . fiber  1 packet Per Tube BID  . fluticasone furoate-vilanterol  1 puff Inhalation Daily  . free water  300 mL Per Tube Q4H  . ipratropium-albuterol  3 mL Nebulization TID  . lidocaine  2 patch Transdermal Q24H  . mouth rinse  15 mL Mouth Rinse 10 times per day  . metoprolol tartrate  25 mg Per Tube Q6H  . pantoprazole sodium  40 mg Per Tube Daily  . SUFentanil  100 mcg Intravenous Once  . umeclidinium bromide  1 puff Inhalation Daily  . vecuronium  10 mg Intravenous Once   Continuous Infusions: . sodium chloride Stopped (05/03/20 0058)  . dexmedetomidine (PRECEDEX) IV infusion 0.2 mcg/kg/hr (05/09/20 0700)  . feeding supplement (VITAL 1.5 CAL) Stopped (05/09/20 0300)  . heparin 2,000 Units/hr (05/09/20 0700)  . phenylephrine (NEO-SYNEPHRINE) Adult infusion Stopped (05/08/20 1715)  . propofol (DIPRIVAN) infusion     PRN Meds: alum & mag hydroxide-simeth, bisacodyl, Gerhardt's butt cream, naLOXone (NARCAN)  injection, ondansetron, oxymetazoline   Vital Signs   Vitals:   05/09/20 0600 05/09/20 0759 05/09/20 0805 05/09/20 0817  BP: (!)  110/57     Pulse: 60  60   Resp: (!) 26  (!) 25   Temp:    98.7 F (37.1 C)  TempSrc:    Oral  SpO2: 97% 96% 97%   Weight:      Height:        Intake/Output Summary (Last 24 hours) at 05/09/2020 0823 Last data filed at 05/09/2020 0700 Gross per 24 hour  Intake 2589.49 ml  Output 3045 ml  Net -455.51 ml   Last 3 Weights 05/09/2020 05/07/2020 05/07/2020  Weight (lbs) 215 lb 6.2 oz 226 lb 13.7 oz 226 lb 13.7 oz  Weight (kg) 97.7 kg 102.9 kg 102.9 kg      Telemetry  Overnight telemetry shows sinus rhythm with heart rate in the 60s, which I personally reviewed.   Physical Exam   Vitals:   05/09/20 0600 05/09/20 0759 05/09/20 0805 05/09/20 0817  BP: (!) 110/57     Pulse: 60  60   Resp: (!) 26  (!) 25   Temp:    98.7 F (37.1 C)  TempSrc:    Oral  SpO2: 97% 96% 97%   Weight:      Height:         Intake/Output Summary (Last 24 hours) at 05/09/2020 0823 Last data filed at 05/09/2020 0700 Gross per 24 hour  Intake 2589.49 ml  Output 3045 ml  Net -455.51 ml    Last 3 Weights 05/09/2020 05/07/2020 05/07/2020  Weight (lbs) 215 lb 6.2 oz 226 lb 13.7 oz 226 lb 13.7 oz  Weight (kg) 97.7 kg 102.9 kg 102.9 kg    Body mass index is 26.92 kg/m.   General: Intubated, on vent Head: Atraumatic, normal size  Eyes: PEERLA, EOMI  Neck: Supple, no JVD Endocrine: No thryomegaly Cardiac: Normal S1, S2; RRR; no murmurs, rubs, or gallops Lungs: Diminished breath sounds bilaterally Abd: Soft, nontender, no hepatomegaly  Ext: 1+ pitting edema Musculoskeletal: No deformities, BUE and BLE strength normal and equal Skin: Warm and dry, no rashes   Neuro: Intubated on vent, following commands  Labs  High Sensitivity Troponin:  No results for input(s): TROPONINIHS in the last 720 hours.   Cardiac EnzymesNo results for input(s): TROPONINI in the last 168 hours. No results for input(s): TROPIPOC in the last 168 hours.  Chemistry Recent Labs  Lab 05/07/20 0435 05/07/20 0435 05/08/20 0202  05/08/20 1059 05/09/20 0213  NA 146*   < > 145 148* 143  K 4.4   < > 4.1 4.2 4.0  CL 118*  --  116*  --  115*  CO2 20*  --  19*  --  18*  GLUCOSE 109*  --  117*  --  123*  BUN 49*  --  51*  --  55*  CREATININE 1.85*  --  1.76*  --  1.84*  CALCIUM 8.3*  --  8.2*  --  8.2*  ALBUMIN 2.1*  --  2.2*  --  1.9*  GFRNONAA 36*  --  39*  --  37*  ANIONGAP 8  --  10  --  10   < > = values in this interval not displayed.    Hematology Recent Labs  Lab 05/07/20 0435 05/07/20 0435 05/08/20 0202 05/08/20 1059 05/09/20 0213  WBC 7.1  --  10.6*  --  8.6  RBC 2.67*  --  2.91*  --  2.44*  HGB 8.4*   < > 9.1* 11.2* 7.5*  HCT 27.9*   < > 29.7* 33.0* 25.3*  MCV 104.5*  --  102.1*  --  103.7*  MCH 31.5  --  31.3  --  30.7  MCHC 30.1  --  30.6  --  29.6*  RDW 16.4*  --  16.1*  --  16.2*  PLT 230  --  332  --  223   < > = values in this interval not displayed.   BNPNo results for input(s): BNP, PROBNP in the last 168 hours.  DDimer No results for input(s): DDIMER in the last 168 hours.   Radiology  DG CHEST PORT 1 VIEW  Result Date: 05/08/2020 CLINICAL DATA:  Endotracheal tube placement EXAM: PORTABLE CHEST 1 VIEW COMPARISON:  05/08/2020 FINDINGS: Endotracheal tube in good position 4 cm above the carina. Feeding tube enters the stomach with the tip not visualized. Diffuse bilateral airspace disease with interval improvement following intubation. Left lower lobe atelectasis and effusion unchanged. IMPRESSION: Endotracheal tube in good position. Diffuse bilateral airspace disease with interval improvement. Electronically Signed   By: Franchot Gallo M.D.   On: 05/08/2020 11:49   DG CHEST PORT 1 VIEW  Result Date: 05/08/2020 CLINICAL DATA:  Dyspnea EXAM: PORTABLE CHEST 1 VIEW COMPARISON:  Two days ago FINDINGS: Tracheal extubation. Feeding tube at least reaches the stomach. Dense bilateral airspace disease with hazy lower chest opacity likely from pleural fluid. There is still cephalized blood flow  appearance, favor failure over infection. IMPRESSION: 1. Stable inflation after extubation. 2. Unchanged  airspace disease with pleural effusion. Electronically Signed   By: Monte Fantasia M.D.   On: 05/08/2020 08:10    Cardiac Studies  TTE 04/13/2020 1. Left ventricular ejection fraction, by estimation, is 50%. The left  ventricle has mildly decreased function. The left ventricle demonstrates  global hypokinesis. Left ventricular diastolic parameters are consistent  with Grade I diastolic dysfunction  (impaired relaxation).  2. Right ventricular systolic function is normal. The right ventricular  size is normal. There is normal pulmonary artery systolic pressure. The  estimated right ventricular systolic pressure is 81.7 mmHg.  3. Left atrial size was moderately dilated.  4. Right atrial size was mildly dilated.  5. The mitral valve is normal in structure. No evidence of mitral valve  regurgitation. No evidence of mitral stenosis.  6. The aortic valve is tricuspid. Aortic valve regurgitation is trivial.  Mild aortic valve sclerosis is present, with no evidence of aortic valve  stenosis.  7. The inferior vena cava is normal in size with greater than 50%  respiratory variability, suggesting right atrial pressure of 3 mmHg.   Patient Profile  Carlos Olson is a 79 y.o. male with hypertension who was admitted on 04/16/2020 for endovascular repair of his AAA.  Course has been complicated by pneumonia and AKI on 04/19/2020.  Also developed atrial fibrillation with RVR.  Underwent TEE/cardioversion on 04/26/2020.  Assessment & Plan   1. Postop atrial fibrillation with RVR -Has been driven by postoperative state, sepsis and pneumonia. TEE/cardioversion on 04/26/2020 was unsuccessful. Was on amiodarone still on this. Converted back to sinus rhythm on 05/02/2020. Was back in A. fib on 05/07/2020. Now back in sinus rhythm. -I suspect he has had issues with respiratory status and that is  driving his A. fib. Seems to be back in rhythm today. Reintubated. Plan for tracheostomy today. -I would recommend to continue amiodarone 200 mg daily. This will help maintain sinus rhythm while he is critically ill. He is being bridged with heparin. Tracheostomy today. Would restart Eliquis after tracheostomy when hemostasis is achieved and bleeding risk is minimal.  2. Volume overload -Diuresis per critical care medicine  [facility] will sign off.   Medication Recommendations: Amiodarone 200 mg daily, restart Eliquis once able after tracheostomy Other recommendations (labs, testing, etc): None Follow up as an outpatient: Please notify [place] [facility] once he is closer to discharge and out of ICU. We will arrange follow-up.  For questions or updates, please contact [place] Please consult [email or web address] for contact info under   Time Spent with Patient: I have spent a total of 15 minutes with patient reviewing hospital notes, telemetry, EKGs, labs and examining the patient as well as establishing an assessment and plan that was discussed with the patient.  > 50% of time was spent in direct patient care.    Signed, Addison Naegeli. Audie Box, [place]  05/09/2020 8:23 AM

## 2020-05-09 NOTE — Progress Notes (Signed)
Late Entry  After trach procedure, RN emptied patient's urine output from collection bag. Urine color was noted to be very dark and very thick. This is unchanged from previously reported, but RN noted urine smelled/looked like stool. Dr. Lynetta Mare notified, who then notified Dr. Donzetta Matters. Will inform oncoming RN.   Joellen Jersey, RN

## 2020-05-09 NOTE — Progress Notes (Signed)
CPT held due to pt having bedside trach

## 2020-05-09 NOTE — Consult Note (Signed)
Urology Consult  Consulting MD:    Corinna Gab, MD  CC: Feculent urine  HPI: This is a 79 year old male who is approximately 3 weeks status post aortobifem bypass graft.  The patient has a complicated postoperative course.  He had tracheostomy placement today.  The patient's nurse today recognize that he has had feculent urine.  There is concern for colovesical fistula.  In questioning, the patient denies longstanding history of urinary tract infection.  He does have a history of diverticulitis treated quite a few years ago.  He has had radical prostatectomy performed in [place] years ago for prostate cancer which has not been found to have recurred.  The patient states that recently he has had pneumaturia.  This is not longstanding in nature.  He is having dysuria.  He has not had a catheter for quite some time.  Urologic consultation is requested. PMH: Past Medical History:  Diagnosis Date  . AAA (abdominal aortic aneurysm) ([place])   . Atherosclerosis of aortic arch ([place]) 09/25/2015   By CT scan   . CAD (coronary artery disease) 09/25/2015   Mild 3v by CT scan   . Cancer [facility])    prostate  . Colon polyps   . COPD (chronic obstructive pulmonary disease) ([place])   . Diverticulosis of colon   . Femoral bruit    bilateral, found by Dr. Jefm Bryant  . Heart murmur    child  . History of prostate cancer   . IBS (irritable bowel syndrome)    improved after retirement  . 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 Pelvic fracture ([place])   . Pneumonia    child  . Pulmonary nodules 09/25/2015   On screening CT scan, rpt 1 yr   . Shortness of breath    doe    PSH: Past Surgical History:  Procedure Laterality Date  . ANTERIOR CERVICAL DECOMP/DISCECTOMY FUSION  07/29/2011   Procedure: ANTERIOR CERVICAL DECOMPRESSION/DISCECTOMY FUSION 1 LEVEL;  Surgeon: Jessy Oto, MD;  Location: [facility];  Service: Orthopedics;  Laterality: N/A;  Anterior Cervical  Discectomy and Fusion C3-4  . AORTA - BILATERAL FEMORAL ARTERY BYPASS GRAFT N/A 04/17/2020   Procedure: AORTA BIFEMORAL BYPASS GRAFT USING A HEMASHIELD GOLD BIFURCATED GRAFT;  Surgeon: Waynetta Sandy, MD;  Location: [facility];  Service: Vascular;  Laterality: N/A;  . BUBBLE STUDY  04/26/2020   Procedure: BUBBLE STUDY;  Surgeon: Freada Bergeron, MD;  Location: [facility];  Service: Cardiovascular;;  . CARDIOVERSION N/A 04/26/2020   Procedure: CARDIOVERSION;  Surgeon: Freada Bergeron, MD;  Location: [facility] ENDOSCOPY;  Service: Cardiovascular;  Laterality: N/A;  . CATARACT EXTRACTION W/PHACO Right 07/10/2015   Procedure: CATARACT EXTRACTION PHACO AND INTRAOCULAR LENS PLACEMENT ([place]);  Surgeon: Birder Robson, MD;  Location: [facility] ORS;  Service: Ophthalmology;  Laterality: Right;  [place] 00:58   . CATARACT EXTRACTION W/PHACO Left 07/24/2015   Procedure: CATARACT EXTRACTION PHACO AND INTRAOCULAR LENS PLACEMENT ([facility]);  Surgeon: Birder Robson, MD;  Location: [facility] ORS;  Service: Ophthalmology;  Laterality: Left;  [place] 00:38   . COLONOSCOPY  12/2007   [place], diverticulosis and polyp, rpt 5 yrs  . dental implants    . EMBOLIZATION Right 03/26/2020   Procedure: EMBOLIZATION;  Surgeon: Waynetta Sandy, MD;  Location: [facility];  Service: Cardiovascular;  Laterality: Right;  . FORAMINOTOMY 2 LEVEL Left 10/2013   C4/5, C5/6 ([place])  . HERNIA REPAIR     right 05/1999, left  09/04/05  . JOINT REPLACEMENT Right    TKR  . KNEE ARTHROPLASTY Left 05/20/2017   Procedure: COMPUTER ASSISTED TOTAL KNEE ARTHROPLASTY;  Surgeon: Dereck Leep, MD;  Location: [facility];  Service: Orthopedics;  Laterality: Left;  . KNEE ARTHROSCOPY  09/1997   left  . POSTERIOR CERVICAL FUSION/FORAMINOTOMY N/A 11/22/2013   Procedure: LEFT C4-5 AND C5-6 FORAMINOTOMY;  Surgeon: Jessy Oto, MD;  Location: [place];  Service: Orthopedics;  Laterality: N/A;  . PROSTATECTOMY  08/1990  . REPLACEMENT TOTAL KNEE  Right 07/2012  . TEE WITHOUT CARDIOVERSION N/A 04/26/2020   Procedure: TRANSESOPHAGEAL ECHOCARDIOGRAM (TEE);  Surgeon: Freada Bergeron, MD;  Location: [facility] ENDOSCOPY;  Service: Cardiovascular;  Laterality: N/A;  . TONSILLECTOMY      Allergies: Allergies  Allergen Reactions  . Dilaudid [Hydromorphone Hcl] Other (See Comments)    Oversedation  . Levaquin [Levofloxacin]     Aortic Aneurysm  . Sulfonamide Derivatives Itching  . Sulfasalazine Itching    Medications: Medications Prior to Admission  Medication Sig Dispense Refill Last Dose  . acetaminophen (TYLENOL) 500 MG tablet Take 1,000 mg by mouth every 6 (six) hours as needed for moderate pain or headache.    04/17/2020 at 0330  . aspirin EC 81 MG tablet Take 1 tablet (81 mg total) by mouth daily.   04/17/2020 at 0330  . Fluticasone-Umeclidin-Vilant (TRELEGY ELLIPTA) 100-62.5-25 MCG/INH AEPB Inhale 1 puff into the lungs daily. 60 each 5 04/17/2020 at 0400  . Multiple Vitamin (MULTIVITAMIN WITH MINERALS) TABS tablet Take 1 tablet by mouth daily. Multivitamin for Men   Past Month at Unknown time  . albuterol (VENTOLIN HFA) 108 (90 Base) MCG/ACT inhaler Inhale 2 puffs into the lungs every 6 (six) hours as needed for wheezing or shortness of breath. 1 Inhaler 3 More than a month at Unknown time     Social History: Social History   Socioeconomic History  . Marital status: Married    Spouse name: Not on file  . Number of children: 2  . Years of education: Not on file  . Highest education level: Not on file  Occupational History  . Occupation: Medical illustrator, retired  Tobacco Use  . Smoking status: Former Smoker    Packs/day: 2.00    Years: 52.00    Pack years: 104.00    Types: Cigarettes    Quit date: 05/15/2007    Years since quitting: 12.9  . Smokeless tobacco: Never Used  . Tobacco comment: quit 06/16/2007/encouraged to remain smoke free  Vaping Use  . Vaping Use: Never used  Substance and Sexual Activity  . Alcohol  use: Yes    Alcohol/week: 14.0 standard drinks    Types: 14 Standard drinks or equivalent per week    Comment: 2 drinks a day  . Drug use: No  . Sexual activity: Never  Other Topics Concern  . Not on file  Social History Narrative   Prefers I call him "[name]"   Lives with wife. Grown children.    Occupation: retired, was in Insurance underwriter business    Activity: home exercise routine dialy   Diet: some water, vegetables daily    Social Determinants of Radio broadcast assistant Strain:   . Difficulty of Paying Living Expenses: Not on file  Food Insecurity:   . Worried About Charity fundraiser in the Last Year: Not on file  . Ran Out of Food in the Last Year: Not on file  Transportation Needs:   . Lack of  Transportation (Medical): Not on file  . Lack of Transportation (Non-Medical): Not on file  Physical Activity:   . Days of Exercise per Week: Not on file  . Minutes of Exercise per Session: Not on file  Stress:   . Feeling of Stress : Not on file  Social Connections:   . Frequency of Communication with Friends and Family: Not on file  . Frequency of Social Gatherings with Friends and Family: Not on file  . Attends Religious Services: Not on file  . Active Member of Clubs or Organizations: Not on file  . Attends Archivist Meetings: Not on file  . Marital Status: Not on file  Intimate Partner Violence:   . Fear of Current or Ex-Partner: Not on file  . Emotionally Abused: Not on file  . Physically Abused: Not on file  . Sexually Abused: Not on file    Family History: Family History  Problem Relation Age of Onset  . Hypertension Mother   . Cancer Father        bone, prostate  . Diabetes Sister        x 40 years  . Cancer Brother        prostate    Review of Systems: Positive: He does note a history of dysuria and pneumaturia recently.  No longstanding gross hematuria or urinary tract infections. Negative:  A further 10 point review of systems was negative  except what is listed in the HPI.  Physical Exam: @VITALS2 @ General: No acute distress.  Awake. Head:  Normocephalic.  Atraumatic. ENT:  EOMI.  Mucous membranes moist Neck:  Supple.  No lymphadenopathy. CV:  Regular rate. Pulmonary: Equal effort bilaterally.  He has a tracheostomy in place. Abdomen: Soft.  Non tender to palpation. Skin:  Normal turgor.  No visible rash. Extremity: No gross deformity of extremities.  Neurologic: Alert. Appropriate mood. .  Studies:  Recent Labs    05/08/20 0202 05/08/20 0202 05/08/20 1059 05/09/20 0213  HGB 9.1*   < > 11.2* 7.5*  WBC 10.6*  --   --  8.6  PLT 332  --   --  223   < > = values in this interval not displayed.    Recent Labs    05/08/20 0202 05/08/20 0202 05/08/20 1059 05/09/20 0213  NA 145   < > 148* 143  K 4.1   < > 4.2 4.0  CL 116*  --   --  115*  CO2 19*  --   --  18*  BUN 51*  --   --  55*  CREATININE 1.76*  --   --  1.84*  CALCIUM 8.2*  --   --  8.2*  GFRNONAA 39*  --   --  37*   < > = values in this interval not displayed.     Recent Labs    05/07/20 0435 05/07/20 1909 05/08/20 0202 05/09/20 0213  INR 1.3*  --   --   --   APTT 26   < > 65* 118*   < > = values in this interval not displayed.     Invalid input(s): ABG  I reviewed the patient's recent CT abdomen and pelvis as well as prior study from October 23.  At that time, he did not have a catheter and did have gas in his bladder.  This might have been from prior catheter placement but this might have been from fistula.  He still has gas in his bladder.  There is significant diverticulosis.  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  The patient does have underlying long-term diverticulosis.  This presentation is obviously temporally related to the patient's aortobifemoral surgery, but I think unrelated to direct surgical injury.  At this point, the patient is in no shape for surgical management, but palliation right now with catheter placement as well  as GI evaluation is warranted.  I would suggest gastroenterologist see him again for evaluation of his colon.  From a urologic standpoint, I do not think any other management is necessary at this time other than to provide bladder drainage/decompression with a catheter.  I have spoken with the patient who seems to understand well as well as his son-in-law, Ronalee Belts.  I outlined the possible plan with them.  They seem to be in agreement and understand the significant nature of this problem.      [phone]

## 2020-05-09 NOTE — Progress Notes (Signed)
Pt transported to CT and back on full vent support. No complications noted.

## 2020-05-09 NOTE — Progress Notes (Signed)
[place] for heparin  Indication: atrial fibrillation  Allergies  Allergen Reactions  . Dilaudid [Hydromorphone Hcl] Other (See Comments)    Oversedation  . Levaquin [Levofloxacin]     Aortic Aneurysm  . Sulfonamide Derivatives Itching  . Sulfasalazine Itching    Patient Measurements: Height: 6\' 3"  (190.5 cm) Weight: 102.9 kg (226 lb 13.7 oz) IBW/kg (Calculated) : 84.5  Heparin dosing weight - 102.9 kg   Vital Signs: Temp: 99.4 F (37.4 C) (11/10 0327) Temp Source: Axillary (11/10 0327) BP: 116/49 (11/10 0200) Pulse Rate: 72 (11/10 0200)  Labs: Recent Labs    05/07/20 0435 05/07/20 0435 05/07/20 1909 05/08/20 0202 05/08/20 0202 05/08/20 1059 05/09/20 0213  HGB 8.4*   < >  --  9.1*   < > 11.2* 7.5*  HCT 27.9*   < >  --  29.7*  --  33.0* 25.3*  PLT 230  --   --  332  --   --  223  APTT 26   < > 53* 65*  --   --  118*  LABPROT 16.0*  --   --   --   --   --   --   INR 1.3*  --   --   --   --   --   --   HEPARINUNFRC  --   --   --  0.92*  --   --  0.72*  CREATININE 1.85*  --   --  1.76*  --   --  1.84*   < > = values in this interval not displayed.    Estimated Creatinine Clearance: 41.6 mL/min (A) (by C-G formula based on SCr of 1.84 mg/dL (H)).  Assessment: 79 y.o. male with h/o Afib, Eliquis on hold, for heparin  Goal of Therapy:  Heparin level 0.3-0.7 units/ml aPTT 66-102 seconds Monitor platelets by anticoagulation protocol: Yes   Plan:   Decrease Heparin 2000 units/hr F/U after tracheostomy today  Phillis Knack, PharmD, BCPS

## 2020-05-10 DIAGNOSIS — N321 Vesicointestinal fistula: Secondary | ICD-10-CM

## 2020-05-10 DIAGNOSIS — I4819 Other persistent atrial fibrillation: Secondary | ICD-10-CM | POA: Diagnosis not present

## 2020-05-10 LAB — RENAL FUNCTION PANEL
Albumin: 2 g/dL — ABNORMAL LOW (ref 3.5–5.0)
Anion gap: 9 (ref 5–15)
BUN: 47 mg/dL — ABNORMAL HIGH (ref 8–23)
CO2: 20 mmol/L — ABNORMAL LOW (ref 22–32)
Calcium: 8.6 mg/dL — ABNORMAL LOW (ref 8.9–10.3)
Chloride: 118 mmol/L — ABNORMAL HIGH (ref 98–111)
Creatinine, Ser: 1.74 mg/dL — ABNORMAL HIGH (ref 0.61–1.24)
GFR, Estimated: 39 mL/min — ABNORMAL LOW (ref >60)
Glucose, Bld: 124 mg/dL — ABNORMAL HIGH (ref 70–99)
Phosphorus: 3.7 mg/dL (ref 2.5–4.6)
Potassium: 4 mmol/L (ref 3.5–5.1)
Sodium: 147 mmol/L — ABNORMAL HIGH (ref 135–145)

## 2020-05-10 LAB — CBC WITH DIFFERENTIAL/PLATELET
Abs Immature Granulocytes: 0.04 10*3/uL (ref 0.00–0.07)
Basophils Absolute: 0.1 10*3/uL (ref 0.0–0.1)
Basophils Relative: 1 %
Eosinophils Absolute: 0.3 10*3/uL (ref 0.0–0.5)
Eosinophils Relative: 3 %
HCT: 26.6 % — ABNORMAL LOW (ref 39.0–52.0)
Hemoglobin: 8.2 g/dL — ABNORMAL LOW (ref 13.0–17.0)
Immature Granulocytes: 0 %
Lymphocytes Relative: 5 %
Lymphs Abs: 0.5 10*3/uL — ABNORMAL LOW (ref 0.7–4.0)
MCH: 32.2 pg (ref 26.0–34.0)
MCHC: 30.8 g/dL (ref 30.0–36.0)
MCV: 104.3 fL — ABNORMAL HIGH (ref 80.0–100.0)
Monocytes Absolute: 0.8 10*3/uL (ref 0.1–1.0)
Monocytes Relative: 8 %
Neutro Abs: 7.8 10*3/uL — ABNORMAL HIGH (ref 1.7–7.7)
Neutrophils Relative %: 83 %
Platelets: 205 10*3/uL (ref 150–400)
RBC: 2.55 MIL/uL — ABNORMAL LOW (ref 4.22–5.81)
RDW: 16.3 % — ABNORMAL HIGH (ref 11.5–15.5)
WBC: 9.4 10*3/uL (ref 4.0–10.5)
nRBC: 0 % (ref 0.0–0.2)

## 2020-05-10 LAB — APTT: aPTT: 76 seconds — ABNORMAL HIGH (ref 24–36)

## 2020-05-10 LAB — GLUCOSE, CAPILLARY
Glucose-Capillary: 110 mg/dL — ABNORMAL HIGH (ref 70–99)
Glucose-Capillary: 113 mg/dL — ABNORMAL HIGH (ref 70–99)
Glucose-Capillary: 111 mg/dL — ABNORMAL HIGH (ref 70–99)
Glucose-Capillary: 120 mg/dL — ABNORMAL HIGH (ref 70–99)
Glucose-Capillary: 109 mg/dL — ABNORMAL HIGH (ref 70–99)
Glucose-Capillary: 119 mg/dL — ABNORMAL HIGH (ref 70–99)

## 2020-05-10 LAB — HEPARIN LEVEL (UNFRACTIONATED): Heparin Unfractionated: 0.26 IU/mL — ABNORMAL LOW (ref 0.30–0.70)

## 2020-05-10 MED ORDER — AMIODARONE IV BOLUS ONLY 150 MG/100ML
150.0000 mg | Freq: Once | INTRAVENOUS | Status: AC
  Administered 2020-05-10: 150 mg via INTRAVENOUS
  Filled 2020-05-10: qty 100

## 2020-05-10 MED ORDER — CHOLESTYRAMINE 4 G PO PACK
4.0000 g | PACK | Freq: Every day | ORAL | Status: DC
  Administered 2020-05-10 – 2020-05-21 (×12): 4 g
  Filled 2020-05-10 (×12): qty 1

## 2020-05-10 MED ORDER — FUROSEMIDE 10 MG/ML IJ SOLN
80.0000 mg | Freq: Once | INTRAMUSCULAR | Status: AC
  Administered 2020-05-10: 80 mg via INTRAVENOUS
  Filled 2020-05-10: qty 8

## 2020-05-10 MED ORDER — APIXABAN 2.5 MG PO TABS
2.5000 mg | ORAL_TABLET | Freq: Two times a day (BID) | ORAL | Status: DC
  Administered 2020-05-10 – 2020-05-11 (×3): 2.5 mg via ORAL
  Filled 2020-05-10 (×3): qty 1

## 2020-05-10 NOTE — Progress Notes (Signed)
eLink Physician-Brief Progress Note Patient Name: Carlos Olson DOB: Nov 25, 1940 MRN: [id number]   Date of Service  05/10/2020  HPI/Events of Note  Patient slipped back into Afib with RVR, per bedside RN he also has a bit of sundowning.  eICU Interventions  Precedex re-started at 0.2 mcg , Amiodarone 150 mg iv x 1.        Kerry Kass Nick Armel 05/10/2020, 12:21 AM

## 2020-05-10 NOTE — Progress Notes (Signed)
NAME:  Carlos Olson, MRN:  [id number], DOB:  Nov 18, 1940, LOS: 60 ADMISSION DATE:  04/17/2020, CONSULTATION DATE: 10/21 REFERRING MD: Dr. Donzetta Matters, CHIEF COMPLAINT: AKI with mild hypoxia  Brief History   Carlos Olson who presented 10/18 for elective endovascular repair abdominal aortic aneurysm with Dr. Donzetta Matters. PCCM consulted 10/21 for assistance in medical management.  Hospital course complicated by AKI, metabolic encephalopathy, hypoxia/ RLL pneumonia, and hypotension. Treated in ICU for sepsis, AFRVR, underwent cardioversion, ABX course. Moved out of ICU. Ongoing course significant for AKI, AF, debility. Then 11/2 he became encephalopathic overnight and was transferred back to ICU. PCCM re-consulted.   Past Medical History  AAA History of prostate cancer Heart murmur Diverticulitis COPD CAD atherosclerosis  Significant Hospital Events   Admitted 10/18 for elective AAA repair 10/19 OR   Consults:  Cardiology PCCM Nephrology GI  Procedures:  Endovascular AAA repair 10/19  Significant Diagnostic Tests:  04/21/2020 CT A/P > Status post abdominal aortic repair with aortobifemoral bypass graft. Some stranding is noted in the operative site likely related to postoperative change. No sizable hematoma is seen. Diverticulosis without diverticulitis. Near complete consolidation of the right lower lobe. Small left pleural effusion is seen with left basilar atelectasis. 10/25 CTH > 1. Stable.  No acute intracranial abnormality. 2. Atrophy with chronic small vessel white matter ischemic disease.  Micro Data:  Blood 10/28 > negative Sputum 10/28 > few candida  Antimicrobials:  10/19 cefazolin 10/22 zosyn > 10/26 Vancomycin 10/29 > 11/2  Zosyn 10/29 > 11/3 Fluconazole 10/29 >>>  Interim history/subjective:  Uneventful tracheostomy placement yesterday. This morning mood is still downcast.   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 Likely related to prior  prostatectomy and diverticulosis rather than most recent surgery.   Objective   Blood pressure 134/66, pulse (!) 102, temperature 98.9 F (37.2 C), temperature source Oral, resp. rate (!) 26, height 6\' 3"  (1.905 m), weight 95.8 kg, SpO2 99 %.    Vent Mode: PSV;CPAP FiO2 (%):  [40 %] 40 % Set Rate:  [26 bmp] 26 bmp Vt Set:  [630 mL] 630 mL PEEP:  [5 cmH20] 5 cmH20 Pressure Support:  [8 cmH20-10 cmH20] 10 cmH20 Plateau Pressure:  [16 cmH20-20 cmH20] 16 cmH20   Intake/Output Summary (Last 24 hours) at 05/10/2020 0825 Last data filed at 05/10/2020 0700 Gross per 24 hour  Intake 2836.95 ml  Output 2180 ml  Net 656.95 ml   Filed Weights   05/07/20 1900 05/09/20 0500 05/10/20 0600  Weight: 102.9 kg 97.7 kg 95.8 kg   Examination: General this is a Carlos Olson resting in bed HENT #6 trach in place. No bleeding.  Pulm clear bilaterally. Still copious secretions.  Card HS normal in sinus rhythm abd soft Ext warm and dry  Neuro intact  GU dark feculent urine  Resolved Hospital Problem list   Hypotension due to hypovolemia  RLL pneumonia Assessment & Plan:   Acute hypercarbic respiratory failure due to combination of oversedation from narcotics and respiratory muscle fatigue: failed extubation trial.  Tracheostomy 11/10 - continue aggressive chest PT - commence trach collar trials during the day - mobilize - continue nocturnal ventilation.   History of COPD; FEV1 65% of predicted H/o tobacco abuse  -Cont tid duoneb -Cont daily Breo ellipta & Incruse   Acute metabolic AND toxic encephalopathy Seemingly significantly impacted by hypercarbia and also narcotic sensitivity  -Avoiding narcotics -Cont precedex for now -Try to taper after trach   AKI->renal  fxn sitting in the 1.8 range. Did bump a little w/ diuretics. Had nml kidney fxn back in September  -Gentle diuresis given edema  Atrial fibrillation -Cont amiodarone VT -Cont lopressor VT -Resume  DOAC  Anemia w/out clear evidence of bleeding likely anemia of acute illness. 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   Best practice:  Diet: NPO/ EN via SBFT Pain/Anxiety/Delirium protocol (if indicated): Precedex to RASS -1. VAP protocol (if indicated):  DVT prophylaxis: Eliquis GI prophylaxis: PPI Glucose control: euglycemic on no coverage Mobility: push PT efforts Code Status: Full Family Communication:updated daily  CRITICAL CARE Performed by: Kipp Brood   Total critical care time: 40 minutes  Critical care time was exclusive of separately billable procedures and treating other patients.  Critical care was necessary to treat or prevent imminent or life-threatening deterioration.  Critical care was time spent personally by me on the following activities: development of treatment plan with patient and/or surrogate as well as nursing, discussions with consultants, evaluation of patient's response to treatment, examination of patient, obtaining history from patient or surrogate, ordering and performing treatments and interventions, ordering and review of laboratory studies, ordering and review of radiographic studies, pulse oximetry, re-evaluation of patient's condition and participation in multidisciplinary rounds.  Kipp Brood, MD [facility] ICU Physician [place]  Pager: [phone] Mobile: [phone] After hours: [phone].

## 2020-05-10 NOTE — Progress Notes (Addendum)
Progress Note    05/10/2020 8:05 AM 14 Days Post-Op  Subjective:  Sitting upright in hospital bed.  Mouthing and writing words to communicate   Vitals:   05/10/20 0751 05/10/20 0759  BP:    Pulse:    Resp:    Temp: 98.9 F (37.2 C)   SpO2:  98%   Physical Exam: Cardiac: tachycardia Lungs:  Mechanical ventilation Incisions:  abd and r groin incision healed Extremities:  Brisk DP signals BLE Abdomen:  Soft, NT, ND Neurologic: A&O  CBC    Component Value Date/Time   WBC 9.4 05/10/2020 0244   RBC 2.55 (L) 05/10/2020 0244   HGB 8.2 (L) 05/10/2020 0244   HGB 12.1 (L) 08/20/2012 0610   HCT 26.6 (L) 05/10/2020 0244   HCT 44.0 08/02/2012 0853   PLT 205 05/10/2020 0244   PLT 158 08/20/2012 0610   MCV 104.3 (H) 05/10/2020 0244   MCV 99 08/02/2012 0853   MCH 32.2 05/10/2020 0244   MCHC 30.8 05/10/2020 0244   RDW 16.3 (H) 05/10/2020 0244   RDW 13.7 08/02/2012 0853   LYMPHSABS 0.5 (L) 05/10/2020 0244   MONOABS 0.8 05/10/2020 0244   EOSABS 0.3 05/10/2020 0244   BASOSABS 0.1 05/10/2020 0244    BMET    Component Value Date/Time   NA 147 (H) 05/10/2020 0244   NA 136 08/20/2012 0610   K 4.0 05/10/2020 0244   K 4.2 08/20/2012 0610   CL 118 (H) 05/10/2020 0244   CL 102 08/20/2012 0610   CO2 20 (L) 05/10/2020 0244   CO2 26 08/20/2012 0610   GLUCOSE 124 (H) 05/10/2020 0244   GLUCOSE 123 (H) 08/20/2012 0610   BUN 47 (H) 05/10/2020 0244   BUN 7 08/20/2012 0610   CREATININE 1.74 (H) 05/10/2020 0244   CREATININE 0.75 08/20/2012 0610   CALCIUM 8.6 (L) 05/10/2020 0244   CALCIUM 8.4 (L) 08/20/2012 0610   GFRNONAA 39 (L) 05/10/2020 0244   GFRNONAA >60 08/20/2012 0610   GFRAA >60 05/07/2017 0949   GFRAA >60 08/20/2012 0610    INR    Component Value Date/Time   INR 1.3 (H) 05/07/2020 0435   INR 1.0 08/02/2012 0853     Intake/Output Summary (Last 24 hours) at 05/10/2020 0805 Last data filed at 05/10/2020 0700 Gross per 24 hour  Intake 2836.95 ml  Output 2180 ml   Net 656.95 ml     Assessment/Plan:  79 y.o. 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  I have seen and evaluated the patient.  He had an episode of A. fib with RVR last night requiring amiodarone.  He is trying to communicate verbally and in writing.  He wants to sit up.  He also states that he "wants to go to sleep."  He is still requiring full ventilator support.  We will try  to get him into a chair today.  The wife asked about starting an antidepressant.  I will relay this to critical care to select the appropriate medication.  Wells Cherika Jessie   05/10/2020 8:05 AM

## 2020-05-10 NOTE — Progress Notes (Signed)
[facility] Progress Note  CC:  ? Colovesical fistula  Subjective: GI asked to reconsult on this patient due to concern for colovesical fistula.  Patient was noted to have feculent material in his urine, first noted on 11/10.  It does in fact have a lot of thick fecal material present.  Urology recommended catheter and GI consult.  Discussed extensively his wife and nurse at bedside.  Dietician asked about something to slow his bowels as he's had a lot of stool output (around 600 mLs per day the last few days, which is improved though from 950 mL previously).  Objective:  Vital signs in last 24 hours: Temp:  [98.8 F (37.1 C)-99.6 F (37.6 C)] 98.9 F (37.2 C) (11/11 0751) Pulse Rate:  [32-126] 102 (11/11 0700) Resp:  [7-30] 26 (11/11 0700) BP: (68-153)/(38-109) 134/66 (11/11 0530) SpO2:  [85 %-100 %] 98 % (11/11 0902) FiO2 (%):  [40 %] 40 % (11/11 0902) Weight:  [95.8 kg] 95.8 kg (11/11 0600) Last BM Date: 05/09/20 General:  Alert, chronically ill-appearing, in NAD; trach present and trying to mouth words  Heart:  Slightly tachy; no murmurs Pulm:  CTAB.  Copious secretions being suctioned by nurse. Abdomen:  Soft, non-distended.  BS present.   GU:  Feculent urine noted in condom cath bag. Extremities:  Without edema. Neurologic:  Alert and oriented x 4;  grossly normal neurologically. Psych:  Alert and cooperative. Normal mood and affect.  Intake/Output from previous day: 11/10 0701 - 11/11 0700 In: 2862.1 [I.V.:1118.1; NG/GT:1744] Out: 2180 [Urine:1530; Stool:650] Intake/Output this shift: Total I/O In: -  Out: 300 [Urine:250; Stool:50]  Lab Results: Recent Labs    05/08/20 0202 05/08/20 0202 05/08/20 1059 05/09/20 0213 05/10/20 0244  WBC 10.6*  --   --  8.6 9.4  HGB 9.1*   < > 11.2* 7.5* 8.2*  HCT 29.7*   < > 33.0* 25.3* 26.6*  PLT 332  --   --  223 205   < > = values in this interval not displayed.   BMET Recent Labs    05/08/20 0202  05/08/20 0202 05/08/20 1059 05/09/20 0213 05/10/20 0244  NA 145   < > 148* 143 147*  K 4.1   < > 4.2 4.0 4.0  CL 116*  --   --  115* 118*  CO2 19*  --   --  18* 20*  GLUCOSE 117*  --   --  123* 124*  BUN 51*  --   --  55* 47*  CREATININE 1.76*  --   --  1.84* 1.74*  CALCIUM 8.2*  --   --  8.2* 8.6*   < > = values in this interval not displayed.   LFT Recent Labs    05/10/20 0244  ALBUMIN 2.0*   CT ABDOMEN PELVIS WO CONTRAST  Result Date: 05/09/2020 CLINICAL DATA:  79 year old male with infection, abscess. EXAM: CT ABDOMEN AND PELVIS WITHOUT CONTRAST TECHNIQUE: Multidetector CT imaging of the abdomen and pelvis was performed following the standard protocol without IV contrast. COMPARISON:  CT Abdomen and Pelvis 04/21/2020. FINDINGS: Lower chest: Bilateral layering pleural effusions have increased, small to moderate. No pericardial effusion. Compressive atelectasis now at both lung bases, resolved air bronchograms in the right lower lobe. Hepatobiliary: Negative noncontrast liver and gallbladder. Pancreas: Negative. Spleen: Negative. Adrenals/Urinary Tract: Negative adrenal glands. Stable, largely stable noncontrast appearance of both kidneys. There is mild bilateral hydroureter which is new. The distal ureters are obscured by postoperative  changes. Small volume of gas persists in the urinary bladder which is fairly decompressed. Stomach/Bowel: Rectal catheter now in place. Fluid in the rectum. Resolved stool ball previously seen. Extensive diverticulosis of the sigmoid colon. No definite active inflammation. Decompressed descending colon. Redundant but otherwise negative transverse colon. Decompressed right colon. No dilated small bowel. No pneumoperitoneum. Enteric tube terminates at the gastric antrum or duodenal bulb. No free fluid identified in the abdomen. Vascular/Lymphatic: Surgical abdominal aortic aneurysm repair, possibly with abdominal aortic bypass. Numerous surgical clips along  the bilateral iliofemoral vascular structures. 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 Since October and organized fluid collection has developed in the retroperitoneum overlying the juxtarenal aorta (series 3, image 42) with simple fluid density. This encompasses about 35 x 85 x 60 mm (AP by transverse by CC). Adjacent thickening of the left pararenal fascia is stable. More stable postoperative appearance of the infrarenal abdominal aorta. But similar low-density fluid collections have developed along the left external iliac vessels (series 3, image 71, 4-5 cm diameter), and right proximal femoral vessels (6 cm on series 3, image 84, and a smaller 5 cm collection just downstream of that on image 96). Vascular patency is not evaluated in the absence of IV contrast. No superimposed lymphadenopathy is evident. Reproductive: Negative. Other: Mildly increased presacral stranding, although nonspecific. Small superimposed volume of pelvic free fluid now. Musculoskeletal: Chronic L5 pars fractures. Advanced degenerative changes throughout the spine. Bulky endplate osteophytosis. No acute osseous abnormality identified. IMPRESSION: 1. Status post Aortic Vascular Surgery with development of several organized but low-density fluid collections since last month along the course of the Juxtarenal Aorta (up to 8 cm series 3, image 43), Left External Iliac Vessels (5 cm image 72), and the Proximal Right Femoral Vessels (6 cm on image 84 and smaller 5 cm collection on image 96). These could be postoperative seromas. Abscess is not excluded. Hematoma and/or pseudoaneurysm are felt less likely but difficult to exclude in the absence of IV contrast. 2. Mild bilateral hydroureter has developed since last month, with postoperative aortoiliac bifurcation changes obscuring the course of both ureters. 3. Mildly increased layering pleural effusions. Lung base atelectasis. 4. Enteric tube and rectal tube in  place. No evidence of bowel obstruction. Electronically Signed   By: Genevie Ann M.D.   On: 05/09/2020 18:49   DG Chest Port 1 View  Result Date: 05/09/2020 CLINICAL DATA:  Status post tracheostomy. EXAM: PORTABLE CHEST 1 VIEW COMPARISON:  Chest x-ray from yesterday. FINDINGS: New tracheostomy tube in good position with the tip between the clavicular heads. Unchanged feeding tube extending below the field of view. Diffuse bilateral interstitial and airspace opacities are unchanged, with likely layering small pleural effusions. No pneumothorax. No acute osseous abnormality. IMPRESSION: 1. New tracheostomy tube in good position. 2. Unchanged diffuse bilateral interstitial and airspace disease, favor pulmonary edema. Electronically Signed   By: Titus Dubin M.D.   On: 05/09/2020 12:03   DG CHEST PORT 1 VIEW  Result Date: 05/08/2020 CLINICAL DATA:  Endotracheal tube placement EXAM: PORTABLE CHEST 1 VIEW COMPARISON:  05/08/2020 FINDINGS: Endotracheal tube in good position 4 cm above the carina. Feeding tube enters the stomach with the tip not visualized. Diffuse bilateral airspace disease with interval improvement following intubation. Left lower lobe atelectasis and effusion unchanged. IMPRESSION: Endotracheal tube in good position. Diffuse bilateral airspace disease with interval improvement. Electronically Signed   By: Franchot Gallo M.D.   On: 05/08/2020 11:49   Assessment / Plan: 79 y.o.  male with a pmh significant for, not necessarily limited to, CAD, AAA s/ repair Oct 2021, COPD, prostate cancer, diverticulosis  # AAA repair 04/17/20. Underwent open abdominal and common iliac artery aneurysm and right common femoral artery aneurysm repair with infrarenal aortatoleft hypogastric and transposition of the left external iliac artery bypass and right common femoral artery bypass with 16 x 8 mm Dacron. Hospital course complicated by metabolic encephalopathy, hypotension, AKI.   # Now with concern for  colovesical fistula as the nurse reported feculent urine yesterday, 11/10.  Urology was consulted and recommended catheter placement and GI evaluation.  They suggested that this could be from chronic diverticular disease.  Colonic diverticulosis does not cause a colovesical fistula.  Certainly chronic or recurrent diverticulitis can, but none of his CT scans this admission have shown any type of inflammatory changes in his colon to suggest diverticulitis.  The only medical/GI management would come if the fistula was due to inflammatory bowel disease.  The patient has no history of such and once again imaging studies have not suggested any type of inflammatory changes of his colon.  Certainly flexible sigmoidoscopy could be performed if it will change medical management and when he is in the condition to do so as this is not an emergent issue.  Otherwise it would need to be surgically managed, and as urology stated, he is not really in the condition for surgical management at this time.  Patient expresses that he does not want any procedure right now, but is confused at times so not sure how reliable he is at decision making currently.  Await any other input from Dr. Tarri Glenn.  # High stool output (around 600 mLs per day the last few days, which is improved though from 950 mL previously). -Will add questran daily via tube to try to slow stool output.   LOS: 23 days   Carlos Olson. Jinny Sweetland  05/10/2020, 9:26 AM

## 2020-05-10 NOTE — Progress Notes (Signed)
[facility] for heparin  Indication: atrial fibrillation  Allergies  Allergen Reactions  . Dilaudid [Hydromorphone Hcl] Other (See Comments)    Oversedation  . Levaquin [Levofloxacin]     Aortic Aneurysm  . Sulfonamide Derivatives Itching  . Sulfasalazine Itching    Patient Measurements: Height: 6\' 3"  (190.5 cm) Weight: 95.8 kg (211 lb 3.2 oz) IBW/kg (Calculated) : 84.5    Vital Signs: Temp: 98.9 F (37.2 C) (11/11 0751) Temp Source: Oral (11/11 0751) BP: 134/66 (11/11 0530) Pulse Rate: 102 (11/11 0700)  Labs: Recent Labs    05/08/20 0202 05/08/20 0202 05/08/20 1059 05/08/20 1059 05/09/20 0213 05/10/20 0244  HGB 9.1*   < > 11.2*   < > 7.5* 8.2*  HCT 29.7*   < > 33.0*  --  25.3* 26.6*  PLT 332  --   --   --  223 205  APTT 65*  --   --   --  118* 76*  HEPARINUNFRC 0.92*  --   --   --  0.72* 0.26*  CREATININE 1.76*  --   --   --  1.84* 1.74*   < > = values in this interval not displayed.    Estimated Creatinine Clearance: 40.5 mL/min (A) (by C-G formula based on SCr of 1.74 mg/dL (H)).  Assessment: 79 y.o. male with h/o Afib on heparin with AAA repair 10/19 and trach placement 11/10. Plans are to start apixaban -Hg= 8.2 -Evangeline= 1.7   Goal of Therapy:  Heparin level 0.3-0.7 units/ml Monitor platelets by anticoagulation protocol: Yes   Plan:   -Discontinue heparin -apixaban 2.5mg  po bid  Hildred Laser, PharmD Clinical Pharmacist **Pharmacist phone directory can now be found on [email or web address] ([email or web address]).  Listed under [place].

## 2020-05-10 NOTE — Progress Notes (Signed)
   05/10/20 1000  Clinical Encounter Type  Visited With Patient;Family  Visit Type Initial  Referral From Nurse  Consult/Referral To Chaplain  Spiritual Encounters  Spiritual Needs Emotional  The chaplain met with the patient's wife Luellen Pucker) and daughter Collie Siad). The chaplain spoke to them about motivating and inspiring the patient to not only live but to fight for his health and wellness. The patient has been feeling overwhelmed and frustrated. The chaplain offered emotional and mental support to the patient's family. Additionally the chaplain spoke with the patient to offer words of affirmation and support. The chaplain will follow up as needed.

## 2020-05-10 NOTE — Progress Notes (Signed)
Cardiology Progress Note  Patient ID: Carlos Olson MRN: [id number] DOB: 10/17/1940 Date of Encounter: 05/10/2020  Primary Cardiologist: Mahesh A Chandrasekhar, MD  Subjective   Chief Complaint: Fatigue  HPI: Back in atrial fibrillation.  Status post tracheostomy.  Eliquis started per primary team.  ROS:  All other ROS reviewed and negative. Pertinent positives noted in the HPI.     Inpatient Medications  Scheduled Meds: . acetaminophen  650 mg Per Tube Q6H   Or  . acetaminophen  325-650 mg Rectal Q6H  . amiodarone  200 mg Per Tube Daily  . apixaban  2.5 mg Oral BID  . atorvastatin  10 mg Per Tube QHS  . chlorhexidine gluconate (MEDLINE KIT)  15 mL Mouth Rinse BID  . Chlorhexidine Gluconate Cloth  6 each Topical Daily  . cholestyramine  4 g Per Tube Daily  . darbepoetin (ARANESP) injection - NON-DIALYSIS  100 mcg Subcutaneous Q Mon-1800  . feeding supplement (PROSource TF)  45 mL Per Tube TID  . fiber  1 packet Per Tube BID  . fluticasone furoate-vilanterol  1 puff Inhalation Daily  . free water  300 mL Per Tube Q4H  . ipratropium-albuterol  3 mL Nebulization TID  . lidocaine  2 patch Transdermal Q24H  . lidocaine  1 application Urethral Once  . mouth rinse  15 mL Mouth Rinse 10 times per day  . metoprolol tartrate  25 mg Per Tube Q6H  . pantoprazole sodium  40 mg Per Tube Daily  . umeclidinium bromide  1 puff Inhalation Daily   Continuous Infusions: . sodium chloride Stopped (05/03/20 0058)  . dexmedetomidine (PRECEDEX) IV infusion 0.2 mcg/kg/hr (05/10/20 0031)  . feeding supplement (VITAL 1.5 CAL) 1,000 mL (05/09/20 1606)  . phenylephrine (NEO-SYNEPHRINE) Adult infusion Stopped (05/09/20 1602)   PRN Meds: alum & mag hydroxide-simeth, bisacodyl, Gerhardt's butt cream, naLOXone (NARCAN)  injection, ondansetron, oxymetazoline   Vital Signs   Vitals:   05/10/20 1115 05/10/20 1130 05/10/20 1139 05/10/20 1159  BP:  133/69    Pulse: (!) 126 (!) 108 (!) 107   Resp:  (!) 24 (!) 25 (!) 24   Temp:    99.2 F (37.3 C)  TempSrc:    Oral  SpO2: 98% 97% 100%   Weight:      Height:        Intake/Output Summary (Last 24 hours) at 05/10/2020 1207 Last data filed at 05/10/2020 1100 Gross per 24 hour  Intake 2635.13 ml  Output 2180 ml  Net 455.13 ml   Last 3 Weights 05/10/2020 05/09/2020 05/07/2020  Weight (lbs) 211 lb 3.2 oz 215 lb 6.2 oz 226 lb 13.7 oz  Weight (kg) 95.8 kg 97.7 kg 102.9 kg      Telemetry  Overnight telemetry shows atrial fibrillation with heart rates in the 100 to 110 bpm range, which I personally reviewed.   ECG  The most recent ECG shows sinus bradycardia, heart rate 59, which I personally reviewed.   Physical Exam   Vitals:   05/10/20 1115 05/10/20 1130 05/10/20 1139 05/10/20 1159  BP:  133/69    Pulse: (!) 126 (!) 108 (!) 107   Resp: (!) 24 (!) 25 (!) 24   Temp:    99.2 F (37.3 C)  TempSrc:    Oral  SpO2: 98% 97% 100%   Weight:      Height:         Intake/Output Summary (Last 24 hours) at 05/10/2020 1207 Last data filed at 05/10/2020 1100   Gross per 24 hour  Intake 2635.13 ml  Output 2180 ml  Net 455.13 ml    Last 3 Weights 05/10/2020 05/09/2020 05/07/2020  Weight (lbs) 211 lb 3.2 oz 215 lb 6.2 oz 226 lb 13.7 oz  Weight (kg) 95.8 kg 97.7 kg 102.9 kg    Body mass index is 26.4 kg/m.  General: Ill-appearing Head: Atraumatic, normal size  Eyes: PEERLA, EOMI  Neck: Supple, no JVD Endocrine: No thryomegaly Cardiac: Normal S1, S2; irregular rhythm, no murmurs rubs or gallops Lungs: Diminished breath sounds bilaterally Abd: Soft, nontender, no hepatomegaly  Ext: 1+ pitting edema Musculoskeletal: No deformities, BUE and BLE strength normal and equal Skin: Warm and dry, no rashes   Neuro: Alert and oriented to person, place, time, and situation, CNII-XII grossly intact, no focal deficits  Psych: Normal mood and affect   Labs  High Sensitivity Troponin:  No results for input(s): TROPONINIHS in the last 720  hours.   Cardiac EnzymesNo results for input(s): TROPONINI in the last 168 hours. No results for input(s): TROPIPOC in the last 168 hours.  Chemistry Recent Labs  Lab 05/08/20 0202 05/08/20 0202 05/08/20 1059 05/09/20 0213 05/10/20 0244  NA 145   < > 148* 143 147*  K 4.1   < > 4.2 4.0 4.0  CL 116*  --   --  115* 118*  CO2 19*  --   --  18* 20*  GLUCOSE 117*  --   --  123* 124*  BUN 51*  --   --  55* 47*  CREATININE 1.76*  --   --  1.84* 1.74*  CALCIUM 8.2*  --   --  8.2* 8.6*  ALBUMIN 2.2*  --   --  1.9* 2.0*  GFRNONAA 39*  --   --  37* 39*  ANIONGAP 10  --   --  10 9   < > = values in this interval not displayed.    Hematology Recent Labs  Lab 05/08/20 0202 05/08/20 0202 05/08/20 1059 05/09/20 0213 05/10/20 0244  WBC 10.6*  --   --  8.6 9.4  RBC 2.91*  --   --  2.44* 2.55*  HGB 9.1*   < > 11.2* 7.5* 8.2*  HCT 29.7*   < > 33.0* 25.3* 26.6*  MCV 102.1*  --   --  103.7* 104.3*  MCH 31.3  --   --  30.7 32.2  MCHC 30.6  --   --  29.6* 30.8  RDW 16.1*  --   --  16.2* 16.3*  PLT 332  --   --  223 205   < > = values in this interval not displayed.   BNPNo results for input(s): BNP, PROBNP in the last 168 hours.  DDimer No results for input(s): DDIMER in the last 168 hours.   Radiology  CT ABDOMEN PELVIS WO CONTRAST  Result Date: 05/09/2020 CLINICAL DATA:  79-year-old male with infection, abscess. EXAM: CT ABDOMEN AND PELVIS WITHOUT CONTRAST TECHNIQUE: Multidetector CT imaging of the abdomen and pelvis was performed following the standard protocol without IV contrast. COMPARISON:  CT Abdomen and Pelvis 04/21/2020. FINDINGS: Lower chest: Bilateral layering pleural effusions have increased, small to moderate. No pericardial effusion. Compressive atelectasis now at both lung bases, resolved air bronchograms in the right lower lobe. Hepatobiliary: Negative noncontrast liver and gallbladder. Pancreas: Negative. Spleen: Negative. Adrenals/Urinary Tract: Negative adrenal glands.  Stable, largely stable noncontrast appearance of both kidneys. There is mild bilateral hydroureter which is new. The distal ureters   are obscured by postoperative changes. Small volume of gas persists in the urinary bladder which is fairly decompressed. Stomach/Bowel: Rectal catheter now in place. Fluid in the rectum. Resolved stool ball previously seen. Extensive diverticulosis of the sigmoid colon. No definite active inflammation. Decompressed descending colon. Redundant but otherwise negative transverse colon. Decompressed right colon. No dilated small bowel. No pneumoperitoneum. Enteric tube terminates at the gastric antrum or duodenal bulb. No free fluid identified in the abdomen. Vascular/Lymphatic: Surgical abdominal aortic aneurysm repair, possibly with abdominal aortic bypass. Numerous surgical clips along the bilateral iliofemoral vascular structures. 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 Since October and organized fluid collection has developed in the retroperitoneum overlying the juxtarenal aorta (series 3, image 42) with simple fluid density. This encompasses about 35 x 85 x 60 mm (AP by transverse by CC). Adjacent thickening of the left pararenal fascia is stable. More stable postoperative appearance of the infrarenal abdominal aorta. But similar low-density fluid collections have developed along the left external iliac vessels (series 3, image 71, 4-5 cm diameter), and right proximal femoral vessels (6 cm on series 3, image 84, and a smaller 5 cm collection just downstream of that on image 96). Vascular patency is not evaluated in the absence of IV contrast. No superimposed lymphadenopathy is evident. Reproductive: Negative. Other: Mildly increased presacral stranding, although nonspecific. Small superimposed volume of pelvic free fluid now. Musculoskeletal: Chronic L5 pars fractures. Advanced degenerative changes throughout the spine. Bulky endplate osteophytosis. No  acute osseous abnormality identified. IMPRESSION: 1. Status post Aortic Vascular Surgery with development of several organized but low-density fluid collections since last month along the course of the Juxtarenal Aorta (up to 8 cm series 3, image 43), Left External Iliac Vessels (5 cm image 72), and the Proximal Right Femoral Vessels (6 cm on image 84 and smaller 5 cm collection on image 96). These could be postoperative seromas. Abscess is not excluded. Hematoma and/or pseudoaneurysm are felt less likely but difficult to exclude in the absence of IV contrast. 2. Mild bilateral hydroureter has developed since last month, with postoperative aortoiliac bifurcation changes obscuring the course of both ureters. 3. Mildly increased layering pleural effusions. Lung base atelectasis. 4. Enteric tube and rectal tube in place. No evidence of bowel obstruction. Electronically Signed   By: Genevie Ann M.D.   On: 05/09/2020 18:49   DG Chest Port 1 View  Result Date: 05/09/2020 CLINICAL DATA:  Status post tracheostomy. EXAM: PORTABLE CHEST 1 VIEW COMPARISON:  Chest x-ray from yesterday. FINDINGS: New tracheostomy tube in good position with the tip between the clavicular heads. Unchanged feeding tube extending below the field of view. Diffuse bilateral interstitial and airspace opacities are unchanged, with likely layering small pleural effusions. No pneumothorax. No acute osseous abnormality. IMPRESSION: 1. New tracheostomy tube in good position. 2. Unchanged diffuse bilateral interstitial and airspace disease, favor pulmonary edema. Electronically Signed   By: Titus Dubin M.D.   On: 05/09/2020 12:03    Cardiac Studies  TTE 04/13/2020   1. Left ventricular ejection fraction, by estimation, is 50%. The left  ventricle has mildly decreased function. The left ventricle demonstrates  global hypokinesis. Left ventricular diastolic parameters are consistent  with Grade I diastolic dysfunction  (impaired relaxation).    2. Right ventricular systolic function is normal. The right ventricular  size is normal. There is normal pulmonary artery systolic pressure. The  estimated right ventricular systolic pressure is 01.7 mmHg.  3. Left atrial size was moderately dilated.  4.  Right atrial size was mildly dilated.  5. The mitral valve is normal in structure. No evidence of mitral valve  regurgitation. No evidence of mitral stenosis.  6. The aortic valve is tricuspid. Aortic valve regurgitation is trivial.  Mild aortic valve sclerosis is present, with no evidence of aortic valve  stenosis.  7. The inferior vena cava is normal in size with greater than 50%  respiratory variability, suggesting right atrial pressure of 3 mmHg.   Patient Profile  Carlos Olsonis a 79 y.o.malewith hypertension who was admitted on 04/16/2020 for endovascular repair of his AAA. Course has been complicated by pneumonia and AKI on 04/19/2020. Also developed atrial fibrillation with RVR. Underwent TEE/cardioversion on 04/26/2020.  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  Assessment & Plan   1.  Postop A. fib with RVR -Underwent AAA repair.  Developed sepsis pneumonia and postop A. fib.  Underwent TEE/cardioversion on 04/26/2020.  This was unsuccessful.  Was placed on amiodarone and converted back to normal rhythm on 05/02/2020.  He has been back to the ICU now.  He has been back and forth from A. fib to normal sinus rhythm.  He is on amiodarone. -He is status post tracheostomy. -He remains in A. fib this morning.  Respiratory status still poor.  We will continue with diuresis as this will improve his rhythm control. 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  We will continue rate control with metoprolol tartrate 25 mg every 6 hours.  You can titrate up for better rate control as needed.  He has received a lot of amiodarone and does not need any further load.  He is  atrial fibrillation is driven by his critical illness.  Hopefully once his respiratory status improves and his overall condition improves he will convert back to normal rhythm.  If he does not I would recommend rate control for now and then once he is closer to discharge cardiology can be reinvolved to consider cardioversion prior to discharge.  I would not recommend any other urgent therapies at this time. -He was placed on heparin for tracheostomy.  He is back on Eliquis today.  Cardiology will sign off at this time.  Continue amiodarone 200 mg daily.  Metoprolol tartrate 25 mg every 6 hours with up titration as needed for better rate control.  Eliquis 5 mg twice daily.  Please call with questions.  Once he is more stable and closer to discharge we can be reinvolved for final recommendations.  For questions or updates, please contact [facility] Please consult [email or web address] for contact info under   Time Spent with Patient: I have spent a total of 25 minutes with patient reviewing hospital notes, telemetry, EKGs, labs and examining the patient as well as establishing an assessment and plan that was discussed with the patient.  > 50% of time was spent in direct patient care.    Signed, [place] T. O'Neal, MD [place]  [facility]  05/10/2020 12:07 PM   

## 2020-05-10 NOTE — Progress Notes (Signed)
SLP Cancellation Note  Patient Details Name: ALEXIZ SUSTAITA MRN: [id number] DOB: 02/09/1941   Cancelled treatment:       Reason Eval/Treat Not Completed: Medical issues which prohibited therapy (Pt is currently on the vent. SLP will follow up on subsequent date.)  Alohilani Levenhagen I. Hardin Negus, [place], [place] Office number [phone] Pager [place] 05/10/2020, 3:01 PM

## 2020-05-10 NOTE — Progress Notes (Addendum)
Physical Therapy Treatment Patient Details Name: Carlos Olson MRN: [id number] DOB: [birth year] Today's Date: 05/10/2020    History of Present Illness 79 year old male who presented 10/18 for elective endovascular repair abdominal aortic aneurysm. Hospital course complicated by AKI, metabolic encephalopathy, acute hypercarbic respiratory failure, hypoxia/ RLL pneumonia, and hypotension. Treated in ICU for sepsis, AFRVR, underwent cardioversion, ABX course. SLP ordered 10/28 given pt coughing with PO liquid intake. MBS 10/29: mild oropharyngeal dysphagia characterized by reduced bolus propulsion, reduced lingual retraction, reduced pharyngeal constriction, and reduced anterior laryngeal movement. Recommended dys 3 diet with thin liquid. Pt became obtunded on 11/2 and was transferred back to ICU. Pt made NPO and Cortrak placed on 11/3, SLP recommended pt remain NPO with water protocol. Pt reintubated 11/5-11/8. CXR 11/9 consistent with previous small bilateral pleural effusions and right lung base aeration.   PT Comments    Pt admitted with above diagnosis. Pt was able to sit EOB about 15 min total today with min guard for up to 30 seconds vs mod assist at times for sitting. Pt tolerated sitting well overall.  VSS during treatment.  Goals revised as pt has had multiple medical complications.  Met 0/4 goals and goals revised. Pt currently with functional limitations due to balance and endurance deficits. Pt will benefit from skilled PT to increase their independence and safety with mobility to allow discharge to the venue listed below.     Follow Up Recommendations  LTACH;Supervision/Assistance - 24 hour     Equipment Recommendations  3in1 (PT)    Recommendations for Other Services       Precautions / Restrictions Precautions Precautions: Fall Precaution Comments: trach with vent,  flexi seal, Primo fit Restrictions Weight Bearing Restrictions: No    Mobility  Bed Mobility Overal bed  mobility: Needs Assistance Bed Mobility: Supine to Sit Rolling: Mod assist Sidelying to sit: +2 for physical assistance;Mod assist;Max assist Supine to sit: Mod assist;+2 for physical assistance;Max assist     General bed mobility comments: Pt needed mod to max assist for bed mobility with incr time to complete tasks. Needed assist with pad to scoot hips to EOB. Needed assist for elevation of trunk and for LEs to EOB.   Transfers                    Ambulation/Gait                 Stairs             Wheelchair Mobility    Modified Rankin (Stroke Patients Only)       Balance Overall balance assessment: Needs assistance Sitting-balance support: No upper extremity supported;Feet supported;Bilateral upper extremity supported Sitting balance-Leahy Scale: Poor Sitting balance - Comments: Pt was able to sit EOB needing mod to max assist initially and progressing to min guard for up to 30 seconds and then needed min to mod assist for sitting.  Pt fatigued and asked to lie down.  Needed suctioning once laid down and nurse suctioned alot of secretions.                                      Cognition Arousal/Alertness: Awake/alert Behavior During Therapy: Flat affect Overall Cognitive Status: Difficult to assess Area of Impairment: Following commands;Memory;Attention;Problem solving                   Current Attention Level: Sustained Memory:  Decreased short-term memory Following Commands: Follows one step commands with increased time     Problem Solving: Slow processing;Decreased initiation;Difficulty sequencing;Requires verbal cues        Exercises General Exercises - Lower Extremity Ankle Circles/Pumps: AROM;Both;10 reps;Seated Long Arc Quad: AROM;Both;10 reps;Seated Hip Flexion/Marching: Both;5 reps;Seated;AAROM    General Comments General comments (skin integrity, edema, etc.): HR up to 138 bpm with pt in afib.  Sats >90% with  pt on vent with trach at 40% FiO2 and PEEP of 5.        Pertinent Vitals/Pain Pain Assessment: Faces Faces Pain Scale: Hurts little more Pain Location: groin bilaterally, abdomen Pain Descriptors / Indicators: Aching;Grimacing;Guarding Pain Intervention(s): Limited activity within patient's tolerance;Monitored during session;Repositioned    Home Living                      Prior Function            PT Goals (current goals can now be found in the care plan section) Acute Rehab PT Goals Patient Stated Goal: to go home PT Goal Formulation: With patient Time For Goal Achievement: 05/24/20 Potential to Achieve Goals: Good Progress towards PT goals:  (Goals revised due to pt with multiple medical complications)    Frequency    Min 2X/week      PT Plan Discharge plan needs to be updated;Frequency needs to be updated    Co-evaluation              AM-PAC PT "6 Clicks" Mobility   Outcome Measure  Help needed turning from your back to your side while in a flat bed without using bedrails?: A Lot Help needed moving from lying on your back to sitting on the side of a flat bed without using bedrails?: A Lot Help needed moving to and from a bed to a chair (including a wheelchair)?: Total Help needed standing up from a chair using your arms (e.g., wheelchair or bedside chair)?: Total Help needed to walk in hospital room?: Total Help needed climbing 3-5 steps with a railing? : Total 6 Click Score: 8    End of Session Equipment Utilized During Treatment: Gait belt;Oxygen Activity Tolerance: Patient limited by fatigue Patient left: with call bell/phone within reach;in bed;with bed alarm set;with nursing/sitter in room Nurse Communication: Mobility status;Need for lift equipment PT Visit Diagnosis: Muscle weakness (generalized) (M62.81);Pain Pain - part of body:  (back and groin)     Time: 3361-2244 PT Time Calculation (min) (ACUTE ONLY): 25 min  Charges:   $Therapeutic Activity: 8-22 mins  Re-eval: 8-22 mins                   Daritza Brees W,PT Acute Rehabilitation Services Pager:  [phone]  Office:  [place] 05/10/2020, 2:03 PM

## 2020-05-10 NOTE — Progress Notes (Signed)
[place] for heparin  Indication: atrial fibrillation  Allergies  Allergen Reactions  . Dilaudid [Hydromorphone Hcl] Other (See Comments)    Oversedation  . Levaquin [Levofloxacin]     Aortic Aneurysm  . Sulfonamide Derivatives Itching  . Sulfasalazine Itching    Patient Measurements: Height: 6\' 3"  (190.5 cm) Weight: 97.7 kg (215 lb 6.2 oz) IBW/kg (Calculated) : 84.5    Vital Signs: Temp: 98.8 F (37.1 C) (11/11 0347) Temp Source: Oral (11/11 0347) BP: 113/68 (11/11 0300) Pulse Rate: 105 (11/11 0300)  Labs: Recent Labs    05/07/20 0435 05/07/20 1909 05/08/20 0202 05/08/20 0202 05/08/20 1059 05/08/20 1059 05/09/20 0213 05/10/20 0244  HGB 8.4*  --  9.1*   < > 11.2*   < > 7.5* 8.2*  HCT 27.9*  --  29.7*   < > 33.0*  --  25.3* 26.6*  PLT 230  --  332  --   --   --  223 205  APTT 26   < > 65*  --   --   --  118* 76*  LABPROT 16.0*  --   --   --   --   --   --   --   INR 1.3*  --   --   --   --   --   --   --   HEPARINUNFRC  --   --  0.92*  --   --   --  0.72* 0.26*  CREATININE 1.85*  --  1.76*  --   --   --  1.84*  --    < > = values in this interval not displayed.    Estimated Creatinine Clearance: 38.3 mL/min (A) (by C-G formula based on SCr of 1.84 mg/dL (H)).  Assessment: 79 y.o. male with h/o Afib, Eliquis on hold and pharmacy dosing heparin.   Heparin level down to 0.26 (subtherapeutic), PTT 76 sec. Eliquis no longer affecting heparin level so will utilize that for monitoring. No issues with line or bleeding reported per RN.  Goal of Therapy:  Heparin level 0.3-0.7 units/ml Monitor platelets by anticoagulation protocol: Yes   Plan:   - Increase heparin to 2200 units/hr - Will f/u 8 hr heparin level  Sherlon Handing, PharmD, BCPS Please see amion for complete clinical pharmacist phone list 05/10/2020 4:11 AM

## 2020-05-10 NOTE — Progress Notes (Signed)
Nutrition Follow-up  DOCUMENTATION CODES:   Severe malnutrition in context of acute illness/injury  INTERVENTION:   Tube Feeding via Cortrak:  Continue Vital 1.5 at 60 ml/hr Continue Pro-Source 45 mL to TID Provides 130 g of protein, 2280 kcals, 1094 mL  Meets 100% of estimated protein and calorie needs  May need to increase free water flushes  Discussed stool consistency, frequency and output with GI; plan to start [place] today   NUTRITION DIAGNOSIS:   Severe Malnutrition related to acute illness (AAA repair surgery with minimal intake x 10 days since admission) as evidenced by moderate muscle depletion, energy intake < or equal to 50% for > or equal to 5 days.  Being addressed via TF   GOAL:   Patient will meet greater than or equal to 90% of their needs  Met via TF   MONITOR:   PO intake, Supplement acceptance  REASON FOR ASSESSMENT:   Rounds    ASSESSMENT:   79 yo male admitted 10/18 for elective endovascular repair of AAA. PMH includes AAA, prostate Ca, heart murmur, diverticulitis, COPD, CAD, atherosclerosis, IBS.  11/02 Obtunded, hypercarbic, transferred to ICU, NG tube inserted, TF started  11/03 Cortrak placed 11/05 Re-Intubated 11/10 Trach placed, possible colovesical fistula, stool in urine  Pt alert, trying to communicate verbally and via writing on clip board. Pt on vent support via trach  Tolerating Vital 1.5 at via Cortrak tube, Pro-Source TF 45 mL TID  Pt with feculent material in urine; Possible colovesical fistula  +liquid stool, 650 mL in 24 hours with undocumented occurrences as well; noted poop coming from urethra as well. Urology and GI both evaluating patient  Hypernatremia, sodium trending back up. Current free water flush of 300 mL q 4 hours. Pt missed several free water boluses yesterday due to NPO status (total of 900 mL of free water)  No pressure injuries noted per RN skin assessment  Current wt 95.8 kg; admit weight 95 kg.  Net + 8 L per I/O flow sheet. Mild edema present  Labs: sodium 147 (H)-trending up Meds: nutrisource fiber   Diet Order:   Diet Order            Diet NPO time specified  Diet effective midnight                 EDUCATION NEEDS:   No education needs have been identified at this time  Skin:  Skin Assessment: Reviewed RN Assessment (abd & R groin surgical incisions)  Last BM:  11/11 rectal tube  Height:   Ht Readings from Last 1 Encounters:  05/07/20 _0  (1.905 m)    Weight:   Wt Readings from Last 1 Encounters:  05/10/20 95.8 kg    Ideal Body Weight:  89.1 kg  BMI:  Body mass index is 26.4 kg/m.  Estimated Nutritional Needs:   Kcal:  2275 kcals  Protein:  120-145 g  Fluid:  >/= 2.2 L   Kerman Passey MS, RDN, LDN, CNSC Registered Dietitian III Clinical Nutrition RD Pager and On-Call Pager Number Located in [place]

## 2020-05-11 DIAGNOSIS — E43 Unspecified severe protein-calorie malnutrition: Secondary | ICD-10-CM | POA: Diagnosis not present

## 2020-05-11 DIAGNOSIS — I714 Abdominal aortic aneurysm, without rupture: Secondary | ICD-10-CM | POA: Diagnosis not present

## 2020-05-11 DIAGNOSIS — N321 Vesicointestinal fistula: Secondary | ICD-10-CM | POA: Diagnosis not present

## 2020-05-11 LAB — CBC WITH DIFFERENTIAL/PLATELET
Abs Immature Granulocytes: 0.06 10*3/uL (ref 0.00–0.07)
Basophils Absolute: 0.1 10*3/uL (ref 0.0–0.1)
Basophils Relative: 1 %
Eosinophils Absolute: 0.3 10*3/uL (ref 0.0–0.5)
Eosinophils Relative: 3 %
HCT: 26.8 % — ABNORMAL LOW (ref 39.0–52.0)
Hemoglobin: 8.2 g/dL — ABNORMAL LOW (ref 13.0–17.0)
Immature Granulocytes: 1 %
Lymphocytes Relative: 5 %
Lymphs Abs: 0.5 10*3/uL — ABNORMAL LOW (ref 0.7–4.0)
MCH: 31.2 pg (ref 26.0–34.0)
MCHC: 30.6 g/dL (ref 30.0–36.0)
MCV: 101.9 fL — ABNORMAL HIGH (ref 80.0–100.0)
Monocytes Absolute: 0.9 10*3/uL (ref 0.1–1.0)
Monocytes Relative: 8 %
Neutro Abs: 8.5 10*3/uL — ABNORMAL HIGH (ref 1.7–7.7)
Neutrophils Relative %: 82 %
Platelets: 222 10*3/uL (ref 150–400)
RBC: 2.63 MIL/uL — ABNORMAL LOW (ref 4.22–5.81)
RDW: 16.2 % — ABNORMAL HIGH (ref 11.5–15.5)
WBC: 10.2 10*3/uL (ref 4.0–10.5)
nRBC: 0 % (ref 0.0–0.2)

## 2020-05-11 LAB — RENAL FUNCTION PANEL
Albumin: 2.1 g/dL — ABNORMAL LOW (ref 3.5–5.0)
Anion gap: 10 (ref 5–15)
BUN: 39 mg/dL — ABNORMAL HIGH (ref 8–23)
CO2: 20 mmol/L — ABNORMAL LOW (ref 22–32)
Calcium: 8.8 mg/dL — ABNORMAL LOW (ref 8.9–10.3)
Chloride: 116 mmol/L — ABNORMAL HIGH (ref 98–111)
Creatinine, Ser: 1.58 mg/dL — ABNORMAL HIGH (ref 0.61–1.24)
GFR, Estimated: 44 mL/min — ABNORMAL LOW (ref >60)
Glucose, Bld: 120 mg/dL — ABNORMAL HIGH (ref 70–99)
Phosphorus: 3.1 mg/dL (ref 2.5–4.6)
Potassium: 3.6 mmol/L (ref 3.5–5.1)
Sodium: 146 mmol/L — ABNORMAL HIGH (ref 135–145)

## 2020-05-11 LAB — TROPONIN I (HIGH SENSITIVITY)
Troponin I (High Sensitivity): 13 ng/L (ref <18)
Troponin I (High Sensitivity): 13 ng/L (ref <18)

## 2020-05-11 LAB — GLUCOSE, CAPILLARY
Glucose-Capillary: 110 mg/dL — ABNORMAL HIGH (ref 70–99)
Glucose-Capillary: 119 mg/dL — ABNORMAL HIGH (ref 70–99)
Glucose-Capillary: 98 mg/dL (ref 70–99)
Glucose-Capillary: 122 mg/dL — ABNORMAL HIGH (ref 70–99)
Glucose-Capillary: 103 mg/dL — ABNORMAL HIGH (ref 70–99)

## 2020-05-11 MED ORDER — APIXABAN 2.5 MG PO TABS
2.5000 mg | ORAL_TABLET | Freq: Two times a day (BID) | ORAL | Status: DC
  Administered 2020-05-11 – 2020-05-17 (×12): 2.5 mg
  Filled 2020-05-11 (×12): qty 1

## 2020-05-11 MED ORDER — ACETAMINOPHEN 160 MG/5ML PO SOLN: 650.0000 mg | ORAL | Status: DC | PRN

## 2020-05-11 NOTE — Progress Notes (Signed)
Pt placed back on full vent support due to increased WOB and pt complaining of trouble breathing.

## 2020-05-11 NOTE — Progress Notes (Signed)
Message about chest pain  Ordered troponin EKG  Patient very sensitive to opiates-not available. Ordered Tylenol  Close monitoring

## 2020-05-11 NOTE — Progress Notes (Signed)
[facility] Progress Note  CC:  Colovesical fistula  Subjective:  No new issues.  SIL at bedside.  Patient is resting this morning.  Objective:  Vital signs in last 24 hours: Temp:  [98.6 F (37 C)-99.8 F (37.7 C)] 99.1 F (37.3 C) (11/12 0736) Pulse Rate:  [80-126] 109 (11/12 0807) Resp:  [22-35] 26 (11/12 0800) BP: (115-149)/(56-97) 128/62 (11/12 0807) SpO2:  [77 %-100 %] 100 % (11/12 0908) FiO2 (%):  [40 %] 40 % (11/12 0800) Last BM Date: 05/11/20 General:  Resting today.  Did open eyes once during my visit. Heart:  Tachy; no murmurs Pulm:  CTAB anteriorly. Abdomen:  Soft, non-distended.  BS present. Extremities:  Without edema.  Intake/Output from previous day: 11/11 0701 - 11/12 0700 In: 2131.8 [I.V.:167.8; NG/GT:1964] Out: 2700 [Urine:2060; Stool:640] Intake/Output this shift: Total I/O In: 60 [NG/GT:60] Out: -   Lab Results: Recent Labs    05/09/20 0213 05/10/20 0244 05/11/20 0045  WBC 8.6 9.4 10.2  HGB 7.5* 8.2* 8.2*  HCT 25.3* 26.6* 26.8*  PLT 223 205 222   BMET Recent Labs    05/09/20 0213 05/10/20 0244 05/11/20 0045  NA 143 147* 146*  K 4.0 4.0 3.6  CL 115* 118* 116*  CO2 18* 20* 20*  GLUCOSE 123* 124* 120*  BUN 55* 47* 39*  CREATININE 1.84* 1.74* 1.58*  CALCIUM 8.2* 8.6* 8.8*   LFT Recent Labs    05/11/20 0045  ALBUMIN 2.1*   CT ABDOMEN PELVIS WO CONTRAST  Result Date: 05/09/2020 CLINICAL DATA:  79 year old male with infection, abscess. EXAM: CT ABDOMEN AND PELVIS WITHOUT CONTRAST TECHNIQUE: Multidetector CT imaging of the abdomen and pelvis was performed following the standard protocol without IV contrast. COMPARISON:  CT Abdomen and Pelvis 04/21/2020. FINDINGS: Lower chest: Bilateral layering pleural effusions have increased, small to moderate. No pericardial effusion. Compressive atelectasis now at both lung bases, resolved air bronchograms in the right lower lobe. Hepatobiliary: Negative noncontrast liver and  gallbladder. Pancreas: Negative. Spleen: Negative. Adrenals/Urinary Tract: Negative adrenal glands. Stable, largely stable noncontrast appearance of both kidneys. There is mild bilateral hydroureter which is new. The distal ureters are obscured by postoperative changes. Small volume of gas persists in the urinary bladder which is fairly decompressed. Stomach/Bowel: Rectal catheter now in place. Fluid in the rectum. Resolved stool ball previously seen. Extensive diverticulosis of the sigmoid colon. No definite active inflammation. Decompressed descending colon. Redundant but otherwise negative transverse colon. Decompressed right colon. No dilated small bowel. No pneumoperitoneum. Enteric tube terminates at the gastric antrum or duodenal bulb. No free fluid identified in the abdomen. Vascular/Lymphatic: Surgical abdominal aortic aneurysm repair, possibly with abdominal aortic bypass. Numerous surgical clips along the bilateral iliofemoral vascular structures. 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 Since October and organized fluid collection has developed in the retroperitoneum overlying the juxtarenal aorta (series 3, image 42) with simple fluid density. This encompasses about 35 x 85 x 60 mm (AP by transverse by CC). Adjacent thickening of the left pararenal fascia is stable. More stable postoperative appearance of the infrarenal abdominal aorta. But similar low-density fluid collections have developed along the left external iliac vessels (series 3, image 71, 4-5 cm diameter), and right proximal femoral vessels (6 cm on series 3, image 84, and a smaller 5 cm collection just downstream of that on image 96). Vascular patency is not evaluated in the absence of IV contrast. No superimposed lymphadenopathy is evident. Reproductive: Negative. Other: Mildly increased presacral stranding,  although nonspecific. Small superimposed volume of pelvic free fluid now. Musculoskeletal: Chronic L5  pars fractures. Advanced degenerative changes throughout the spine. Bulky endplate osteophytosis. No acute osseous abnormality identified. IMPRESSION: 1. Status post Aortic Vascular Surgery with development of several organized but low-density fluid collections since last month along the course of the Juxtarenal Aorta (up to 8 cm series 3, image 43), Left External Iliac Vessels (5 cm image 72), and the Proximal Right Femoral Vessels (6 cm on image 84 and smaller 5 cm collection on image 96). These could be postoperative seromas. Abscess is not excluded. Hematoma and/or pseudoaneurysm are felt less likely but difficult to exclude in the absence of IV contrast. 2. Mild bilateral hydroureter has developed since last month, with postoperative aortoiliac bifurcation changes obscuring the course of both ureters. 3. Mildly increased layering pleural effusions. Lung base atelectasis. 4. Enteric tube and rectal tube in place. No evidence of bowel obstruction. Electronically Signed   By: Genevie Ann M.D.   On: 05/09/2020 18:49   DG Chest Port 1 View  Result Date: 05/09/2020 CLINICAL DATA:  Status post tracheostomy. EXAM: PORTABLE CHEST 1 VIEW COMPARISON:  Chest x-ray from yesterday. FINDINGS: New tracheostomy tube in good position with the tip between the clavicular heads. Unchanged feeding tube extending below the field of view. Diffuse bilateral interstitial and airspace opacities are unchanged, with likely layering small pleural effusions. No pneumothorax. No acute osseous abnormality. IMPRESSION: 1. New tracheostomy tube in good position. 2. Unchanged diffuse bilateral interstitial and airspace disease, favor pulmonary edema. Electronically Signed   By: Titus Dubin M.D.   On: 05/09/2020 12:03   Assessment / Plan: 79 y.o.malewith a pmh significant for, not necessarily limited to, CAD, AAA s/ repair Oct 2021, COPD, prostate cancer, diverticulosis  #  AAA repair 04/17/20. Underwentopen abdominal and common  iliac artery aneurysm and right common femoral artery aneurysm repair with infrarenal aortatoleft hypogastric and transposition of the left external iliac artery bypass and right common femoral artery bypass with 16 x 8 mm Dacron. Hospital course complicated by metabolic encephalopathy, hypotension, AKI.   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  Urology was consulted and recommended catheter placement and GI evaluation.  They suggested that this could be from chronic diverticular disease.  Colonic diverticulosis does not cause a colovesical fistula.  Certainly chronic or recurrent diverticulitis can, but none of his CT scans this admission have shown any type of inflammatory changes in his colon to suggest diverticulitis.  The only medical/GI management would come if the fistula was due to inflammatory bowel disease.  The patient has no history of such and once again imaging studies have not suggested any type of inflammatory changes of his colon.  Certainly flexible sigmoidoscopy could be performed if it will change medical management and when he is in the condition to do so as this is not an emergent issue.  Otherwise it would need to be surgically managed, and as urology stated, he is not really in the condition for surgical management at this time.  Patient expressed that he does not want any procedure right now, but is confused at times so not sure how reliable he is at decision making currently.  # High stool output (around 600 mLs per day the last few days, which is improved though from 950 mL previously). -Added questran daily via tube to try to slow stool output on 11/11.  Can increased to BID after a few  days if needed.   LOS: 24 days   Laban Emperor. Rome Schlauch  05/11/2020, 9:29 AM

## 2020-05-11 NOTE — Progress Notes (Signed)
CPT held at this time due to pt sleeping.  °

## 2020-05-11 NOTE — Progress Notes (Signed)
Sputum collected and sent to lab 

## 2020-05-11 NOTE — Progress Notes (Signed)
SLP Cancellation Note  Patient Details Name: JONATAN WILSEY MRN: [id number] DOB: 26-Aug-1940   Cancelled treatment:       Reason Eval/Treat Not Completed: Patient not medically ready. Pt has not yet progressed to ATC. RN reports pt has needed increased pressure on CPAP/PS mode this am. Will f/u this weekend to check for readiness.   Herbie Baltimore, MA CCC-SLP  Acute Rehabilitation Services Pager [phone] Office [phone]  Lynann Beaver 05/11/2020, 10:19 AM

## 2020-05-11 NOTE — Progress Notes (Signed)
Pt placed on PSV 5/5 per wean protocol and is tolerating well at this time. RN aware. CPT held at this time due to pt sleeping. RT to continue to monitor.

## 2020-05-11 NOTE — Progress Notes (Addendum)
Progress Note    05/11/2020 8:30 AM 15 Days Post-Op  Subjective: Drowsy on vent.  Opens eyes to name.  Daughter and son-in-law at bedside Tracheostomy yesterday.  Urology and gastroenterology consults due to fecal contents in urine. Condom catheter in place.  The patient's daughter says he is complaining of pain when passing urine.  He is tolerating tube feeds  Vitals:   05/11/20 0800 05/11/20 0807  BP: 128/62 128/62  Pulse: (!) 109 (!) 109  Resp: (!) 26   Temp:    SpO2: 99%     Physical Exam: Cardiac: Irregular rhythm, rate in 100s Lungs: Clear to auscultation bilaterally Incisions: Incision and right groin incisions continue to heal Extremities: Feet are warm.  Mild edema Abdomen: Mild distention, positive bowel sounds  CBC    Component Value Date/Time   WBC 10.2 05/11/2020 0045   RBC 2.63 (L) 05/11/2020 0045   HGB 8.2 (L) 05/11/2020 0045   HGB 12.1 (L) 08/20/2012 0610   HCT 26.8 (L) 05/11/2020 0045   HCT 44.0 08/02/2012 0853   PLT 222 05/11/2020 0045   PLT 158 08/20/2012 0610   MCV 101.9 (H) 05/11/2020 0045   MCV 99 08/02/2012 0853   MCH 31.2 05/11/2020 0045   MCHC 30.6 05/11/2020 0045   RDW 16.2 (H) 05/11/2020 0045   RDW 13.7 08/02/2012 0853   LYMPHSABS 0.5 (L) 05/11/2020 0045   MONOABS 0.9 05/11/2020 0045   EOSABS 0.3 05/11/2020 0045   BASOSABS 0.1 05/11/2020 0045    BMET    Component Value Date/Time   NA 146 (H) 05/11/2020 0045   NA 136 08/20/2012 0610   K 3.6 05/11/2020 0045   K 4.2 08/20/2012 0610   CL 116 (H) 05/11/2020 0045   CL 102 08/20/2012 0610   CO2 20 (L) 05/11/2020 0045   CO2 26 08/20/2012 0610   GLUCOSE 120 (H) 05/11/2020 0045   GLUCOSE 123 (H) 08/20/2012 0610   BUN 39 (H) 05/11/2020 0045   BUN 7 08/20/2012 0610   CREATININE 1.58 (H) 05/11/2020 0045   CREATININE 0.75 08/20/2012 0610   CALCIUM 8.8 (L) 05/11/2020 0045   CALCIUM 8.4 (L) 08/20/2012 0610   GFRNONAA 44 (L) 05/11/2020 0045   GFRNONAA >60 08/20/2012 0610   GFRAA >60  05/07/2017 0949   GFRAA >60 08/20/2012 0610     Intake/Output Summary (Last 24 hours) at 05/11/2020 0830 Last data filed at 05/11/2020 0800 Gross per 24 hour  Intake 1765.77 ml  Output 2400 ml  Net -634.23 ml    HOSPITAL MEDICATIONS Scheduled Meds: . acetaminophen  650 mg Per Tube Q6H   Or  . acetaminophen  325-650 mg Rectal Q6H  . amiodarone  200 mg Per Tube Daily  . apixaban  2.5 mg Oral BID  . atorvastatin  10 mg Per Tube QHS  . chlorhexidine gluconate (MEDLINE KIT)  15 mL Mouth Rinse BID  . Chlorhexidine Gluconate Cloth  6 each Topical Daily  . cholestyramine  4 g Per Tube Daily  . darbepoetin (ARANESP) injection - NON-DIALYSIS  100 mcg Subcutaneous Q Mon-1800  . feeding supplement (PROSource TF)  45 mL Per Tube TID  . fiber  1 packet Per Tube BID  . fluticasone furoate-vilanterol  1 puff Inhalation Daily  . free water  300 mL Per Tube Q4H  . ipratropium-albuterol  3 mL Nebulization TID  . lidocaine  2 patch Transdermal Q24H  . lidocaine  1 application Urethral Once  . mouth rinse  15 mL Mouth Rinse 10  times per day  . metoprolol tartrate  25 mg Per Tube Q6H  . pantoprazole sodium  40 mg Per Tube Daily  . umeclidinium bromide  1 puff Inhalation Daily   Continuous Infusions: . sodium chloride Stopped (05/03/20 0058)  . dexmedetomidine (PRECEDEX) IV infusion Stopped (05/10/20 2000)  . feeding supplement (VITAL 1.5 CAL) 60 mL/hr at 05/11/20 0800  . phenylephrine (NEO-SYNEPHRINE) Adult infusion Stopped (05/09/20 1602)   PRN Meds:.alum & mag hydroxide-simeth, bisacodyl, Gerhardt's butt cream, naLOXone (NARCAN)  injection, ondansetron, oxymetazoline  Assessment: 79 year old male 3-1/2 weeks status post open abdominal and common iliac artery aneurysm and right common femoral artery aneurysm repair with infrarenal aortatoleft hypogastric and transposition of the left external iliac artery bypass and right common femoral artery bypass with 16 x 8 mm   Vascular: Lower  extremities well perfused  Cardiac:Atrial fibrillation early postop with rapid ventricular response.  Cardioversion performed October 28.  Back into atrial fibrillation and remains on amiodarone  GI: New finding of  colovesical fistula.  Condom catheter in place. Tolerating TFs.  Pulmonary:  Right pneumonia; chest x-ray improved. Remains ventilated.  POD one tracheostomy  Renal: Good urine output.  Serum creatinine continues downward trend  Heme: Anemia, multifactorial.  Last transfused 10/30.  Hemoglobin trending upward.  ID: T-max 99.8.  No leukocytosis.  Remains on Zosyn, vancomycin and Diflucan   Plan: -Continue supportive care.  Appreciate all consultants recommendations and management. -DVT prophylaxis:  On apixiban   Carlos Grill, PA-C [facility] [phone] 05/11/2020  8:30 AM   I have independently interviewed and examinedpatient and agree with PA assessment and plan above.  He is more alert this afternoon.  Has not done trach collar today.  Appreciate GI and urology input.  Has a condom catheter in place at this time as his bladder output is too thick to come through a Foley catheter.  Continue trach collar trials and vent weaning per critical care.  Carlos Olson C. Donzetta Matters, MD [facility] [place] Office: [phone] Pager: [phone]

## 2020-05-11 NOTE — Progress Notes (Signed)
NAME:  Carlos Olson, MRN:  [id number], DOB:  1941-03-01, LOS: 24 ADMISSION DATE:  04/17/2020, CONSULTATION DATE: 10/21 REFERRING MD: Dr. Donzetta Matters, CHIEF COMPLAINT: AKI with mild hypoxia  Brief History   79 year old male who presented 10/18 for elective endovascular repair abdominal aortic aneurysm with Dr. Donzetta Matters. PCCM consulted 10/21 for assistance in medical management.  Hospital course complicated by AKI, metabolic encephalopathy, hypoxia/ RLL pneumonia, and hypotension. Treated in ICU for sepsis, AFRVR, underwent cardioversion, ABX course. Moved out of ICU. Ongoing course significant for AKI, AF, debility. Then 11/2 he became encephalopathic overnight and was transferred back to ICU. PCCM re-consulted.   Past Medical History  AAA History of prostate cancer Heart murmur Diverticulitis COPD CAD atherosclerosis  Significant Hospital Events   Admitted 10/18 for elective AAA repair 10/19 OR   Consults:  Cardiology PCCM Nephrology GI  Procedures:  Endovascular AAA repair 10/19  Significant Diagnostic Tests:  04/21/2020 CT A/P > Status post abdominal aortic repair with aortobifemoral bypass graft. Some stranding is noted in the operative site likely related to postoperative change. No sizable hematoma is seen. Diverticulosis without diverticulitis. Near complete consolidation of the right lower lobe. Small left pleural effusion is seen with left basilar atelectasis. 10/25 CTH > 1. Stable.  No acute intracranial abnormality. 2. Atrophy with chronic small vessel white matter ischemic disease.  Micro Data:  Blood 10/28 > negative Sputum 10/28 > few candida  Antimicrobials:  10/19 cefazolin 10/22 zosyn > 10/26 Vancomycin 10/29 > 11/2  Zosyn 10/29 > 11/3 Fluconazole 10/29 >>>  Interim history/subjective:  S/p tracheostomy tube placement on 11/10 Did not sleep well last night Denies any discomfort Increased trach secretions  Objective   Blood pressure 128/62, pulse (!) 109,  temperature 99.1 F (37.3 C), temperature source Axillary, resp. rate (!) 26, height 6\' 3"  (1.905 m), weight 95.8 kg, SpO2 99 %.    Vent Mode: PSV;CPAP FiO2 (%):  [40 %] 40 % Set Rate:  [26 bmp] 26 bmp Vt Set:  [630 mL] 630 mL PEEP:  [5 cmH20] 5 cmH20 Pressure Support:  [5 cmH20] 5 cmH20 Plateau Pressure:  [17 cmH20-20 cmH20] 20 cmH20   Intake/Output Summary (Last 24 hours) at 05/11/2020 7829 Last data filed at 05/11/2020 0800 Gross per 24 hour  Intake 1683.77 ml  Output 2250 ml  Net -566.23 ml   Filed Weights   05/07/20 1900 05/09/20 0500 05/10/20 0600  Weight: 102.9 kg 97.7 kg 95.8 kg   Examination: Elderly, frail, does not appear to be in distress HENT a size 6 tracheostomy tube in place, site looks well-kempt Pulm: Clear bilaterally, significant secretions Card: S1-S2 appreciated Abdomen: Soft, bowel sounds appreciated Extremities: Good bulk Neuro intact  GU dark feculent urine  Resolved Hospital Problem list   Hypotension due to hypovolemia  RLL pneumonia Assessment & Plan:  Acute hypercarbic respiratory failure  S/p tracheostomy 11/10 -Continue chest PT -Trach trials as tolerated -Mobilization as tolerated -Ventilator at night -Send tracheal aspirate for cultures-currently has no fever or leukocytosis  Background history of COPD -Continue bronchodilators -Breo and Incruse  Encephalopathy Combination of metabolic and toxic -Avoid narcotics -Precedex as needed  Acute kidney injury -Trend electrolytes -Avoid nephrotoxic's  Atrial fibrillation -On amiodarone -On Lopressor -DOAC  Anemia  History of pulmonary nodule -Follow-up as outpatient  Recent repair of abdominal aortic aneurysm -Followed by vascular surgery  Malnutrition -Continue tube feeds  Best practice:  Diet: Continue enteral nutrition Pain/Anxiety/Delirium protocol (if indicated): Precedex to RASS -1. VAP protocol (if indicated):  DVT prophylaxis: Eliquis GI prophylaxis:  PPI Glucose control: euglycemic on no coverage Mobility: push PT efforts Code Status: Full Family Communication: Updated family member at bedside  The patient is critically ill with multiple organ systems failure and requires high complexity decision making for assessment and support, frequent evaluation and titration of therapies, application of advanced monitoring technologies and extensive interpretation of multiple databases. Critical Care Time devoted to patient care services described in this note independent of APP/resident time (if applicable)  is 30 minutes.   Sherrilyn Rist MD [facility] Personal pager: [phone] If unanswered, please page CCM On-call: [phone]

## 2020-05-12 DIAGNOSIS — E43 Unspecified severe protein-calorie malnutrition: Secondary | ICD-10-CM | POA: Diagnosis not present

## 2020-05-12 DIAGNOSIS — N179 Acute kidney failure, unspecified: Secondary | ICD-10-CM | POA: Diagnosis not present

## 2020-05-12 DIAGNOSIS — J9601 Acute respiratory failure with hypoxia: Secondary | ICD-10-CM | POA: Diagnosis not present

## 2020-05-12 DIAGNOSIS — N321 Vesicointestinal fistula: Secondary | ICD-10-CM | POA: Diagnosis not present

## 2020-05-12 LAB — RENAL FUNCTION PANEL
Albumin: 2 g/dL — ABNORMAL LOW (ref 3.5–5.0)
Anion gap: 7 (ref 5–15)
BUN: 39 mg/dL — ABNORMAL HIGH (ref 8–23)
CO2: 19 mmol/L — ABNORMAL LOW (ref 22–32)
Calcium: 8.6 mg/dL — ABNORMAL LOW (ref 8.9–10.3)
Chloride: 115 mmol/L — ABNORMAL HIGH (ref 98–111)
Creatinine, Ser: 1.55 mg/dL — ABNORMAL HIGH (ref 0.61–1.24)
GFR, Estimated: 45 mL/min — ABNORMAL LOW (ref >60)
Glucose, Bld: 133 mg/dL — ABNORMAL HIGH (ref 70–99)
Phosphorus: 2.6 mg/dL (ref 2.5–4.6)
Potassium: 4.3 mmol/L (ref 3.5–5.1)
Sodium: 141 mmol/L (ref 135–145)

## 2020-05-12 LAB — CBC WITH DIFFERENTIAL/PLATELET
Abs Immature Granulocytes: 0.05 10*3/uL (ref 0.00–0.07)
Basophils Absolute: 0.1 10*3/uL (ref 0.0–0.1)
Basophils Relative: 1 %
Eosinophils Absolute: 0.3 10*3/uL (ref 0.0–0.5)
Eosinophils Relative: 3 %
HCT: 27.3 % — ABNORMAL LOW (ref 39.0–52.0)
Hemoglobin: 8.3 g/dL — ABNORMAL LOW (ref 13.0–17.0)
Immature Granulocytes: 1 %
Lymphocytes Relative: 4 %
Lymphs Abs: 0.4 10*3/uL — ABNORMAL LOW (ref 0.7–4.0)
MCH: 31.1 pg (ref 26.0–34.0)
MCHC: 30.4 g/dL (ref 30.0–36.0)
MCV: 102.2 fL — ABNORMAL HIGH (ref 80.0–100.0)
Monocytes Absolute: 0.8 10*3/uL (ref 0.1–1.0)
Monocytes Relative: 8 %
Neutro Abs: 8.9 10*3/uL — ABNORMAL HIGH (ref 1.7–7.7)
Neutrophils Relative %: 83 %
Platelets: 200 10*3/uL (ref 150–400)
RBC: 2.67 MIL/uL — ABNORMAL LOW (ref 4.22–5.81)
RDW: 16.2 % — ABNORMAL HIGH (ref 11.5–15.5)
WBC: 10.5 10*3/uL (ref 4.0–10.5)
nRBC: 0 % (ref 0.0–0.2)

## 2020-05-12 LAB — GLUCOSE, CAPILLARY
Glucose-Capillary: 104 mg/dL — ABNORMAL HIGH (ref 70–99)
Glucose-Capillary: 111 mg/dL — ABNORMAL HIGH (ref 70–99)
Glucose-Capillary: 114 mg/dL — ABNORMAL HIGH (ref 70–99)
Glucose-Capillary: 117 mg/dL — ABNORMAL HIGH (ref 70–99)
Glucose-Capillary: 121 mg/dL — ABNORMAL HIGH (ref 70–99)
Glucose-Capillary: 122 mg/dL — ABNORMAL HIGH (ref 70–99)
Glucose-Capillary: 129 mg/dL — ABNORMAL HIGH (ref 70–99)

## 2020-05-12 MED ORDER — ACETAMINOPHEN 160 MG/5ML PO SOLN
1000.0000 mg | Freq: Four times a day (QID) | ORAL | Status: DC | PRN
  Administered 2020-05-12 – 2020-05-13 (×2): 1000 mg via ORAL
  Filled 2020-05-12 (×2): qty 40.6

## 2020-05-12 MED ORDER — METOPROLOL TARTRATE 25 MG/10 ML ORAL SUSPENSION
25.0000 mg | Freq: Four times a day (QID) | ORAL | Status: DC
  Administered 2020-05-12 – 2020-05-18 (×22): 25 mg
  Filled 2020-05-12 (×22): qty 10

## 2020-05-12 MED ORDER — ALPRAZOLAM 0.25 MG PO TABS
0.2500 mg | ORAL_TABLET | Freq: Two times a day (BID) | ORAL | Status: DC | PRN
  Administered 2020-05-12: 0.25 mg
  Filled 2020-05-12: qty 1

## 2020-05-12 MED ORDER — DEXMEDETOMIDINE HCL IN NACL 400 MCG/100ML IV SOLN
0.0000 ug/kg/h | INTRAVENOUS | Status: DC
  Administered 2020-05-12: 0.05 ug/kg/h via INTRAVENOUS
  Filled 2020-05-12: qty 100

## 2020-05-12 MED ORDER — LINEZOLID 600 MG/300ML IV SOLN
600.0000 mg | Freq: Two times a day (BID) | INTRAVENOUS | Status: DC
  Administered 2020-05-12 – 2020-05-13 (×3): 600 mg via INTRAVENOUS
  Filled 2020-05-12 (×4): qty 300

## 2020-05-12 NOTE — Progress Notes (Signed)
NAME:  SAHARSH STERLING, MRN:  [id number], DOB:  [birth year], LOS: 35 ADMISSION DATE:  04/17/2020, CONSULTATION DATE: 10/21 REFERRING MD: Dr. Donzetta Matters, CHIEF COMPLAINT: AKI with mild hypoxia  Brief History   79 year old male who presented 10/18 for elective endovascular repair abdominal aortic aneurysm with Dr. Donzetta Matters. PCCM consulted 10/21 for assistance in medical management.  Hospital course complicated by AKI, metabolic encephalopathy, hypoxia/ RLL pneumonia, and hypotension. Treated in ICU for sepsis, AFRVR, underwent cardioversion, ABX course. Moved out of ICU. Ongoing course significant for AKI, AF, debility. Then 11/2 he became encephalopathic overnight and was transferred back to ICU. PCCM re-consulted.   Past Medical History  AAA History of prostate cancer Heart murmur Diverticulitis COPD CAD atherosclerosis  Significant Hospital Events   Admitted 10/18 for elective AAA repair 10/19 OR   Consults:  Cardiology PCCM Nephrology GI  Procedures:  Endovascular AAA repair 10/19 Tracheostomy tube placement 11/10  Significant Diagnostic Tests:  04/21/2020 CT A/P > Status post abdominal aortic repair with aortobifemoral bypass graft. Some stranding is noted in the operative site likely related to postoperative change. No sizable hematoma is seen. Diverticulosis without diverticulitis. Near complete consolidation of the right lower lobe. Small left pleural effusion is seen with left basilar atelectasis. 10/25 CTH > 1. Stable.  No acute intracranial abnormality. 2. Atrophy with chronic small vessel white matter ischemic disease.  Micro Data:  Blood 10/28 > negative Sputum 10/28 > few candida Tracheal aspirate 1112-few Staph aureus Antimicrobials:  10/19 cefazolin 10/22 zosyn > 10/26 Vancomycin 10/29 > 11/2  Zosyn 10/29 > 11/3 Fluconazole 10/29 >>>  Interim history/subjective:  T-max 100.9 Did not sleep well last night Denies ongoing discomfort  Objective   Blood pressure  109/75, pulse (!) 101, temperature (!) 100.4 F (38 C), resp. rate (!) 25, height 6\' 3"  (1.905 m), weight 101 kg, SpO2 100 %.    Vent Mode: PSV;CPAP FiO2 (%):  [40 %] 40 % Set Rate:  [26 bmp] 26 bmp Vt Set:  [630 mL] 630 mL PEEP:  [5 cmH20] 5 cmH20 Pressure Support:  [10 VHQ46-96 cmH20] 12 cmH20 Plateau Pressure:  [15 cmH20-20 cmH20] 18 cmH20   Intake/Output Summary (Last 24 hours) at 05/12/2020 0847 Last data filed at 05/12/2020 0700 Gross per 24 hour  Intake 3780 ml  Output 610 ml  Net 3170 ml   Filed Weights   05/09/20 0500 05/10/20 0600 05/12/20 0448  Weight: 97.7 kg 95.8 kg 101 kg   Examination: Elderly, does not appear to be in distress HEENT: Size 6 trach tube in place, looks well-kempt  Pulm: Mild rhonchi, few rales at the bases Card: S1-S2 appreciated Abdomen: Soft, bowel sounds appreciated Extremities: Good bulk Neuro intact  GU   Resolved Hospital Problem list   Hypotension due to hypovolemia  RLL pneumonia Assessment & Plan:  Acute hypercarbic respiratory failure S/p tracheostomy 11/10 Significant secretions Tracheal aspirate showing Staph aureus -Start on Zyvox  Mobilization as tolerated Continue ventilator Weaning as tolerated   Background history of COPD -Continue bronchodilators  Encephalopathy Combination of metabolic and toxic -Avoid narcotics -Precedex as needed  Acute kidney injury -Avoid nephrotoxic's -Trend electrolytes  Atrial fibrillation -On amiodarone -On Lopressor -DOAC  Anemia  History of pulmonary nodule -Follow-up as outpatient  Recent repair of abdominal aortic aneurysm -Followed by vascular surgery  Malnutrition -Continue tube feeds  Discussed with daughter at bedside Best practice:  Diet: Continue enteral nutrition Pain/Anxiety/Delirium protocol (if indicated): Precedex to RASS -1. VAP protocol (if indicated):  DVT prophylaxis: Eliquis  GI prophylaxis: PPI Glucose control: euglycemic on no  coverage Mobility: push PT efforts Code Status: Full Family Communication: Updated family member at bedside  Critical the patient is critically ill with multiple organ systems failure and requires high complexity decision making for assessment and support, frequent evaluation and titration of therapies, application of advanced monitoring technologies and extensive interpretation of multiple databases. Critical Care Time devoted to patient care services described in this note independent of APP/resident time (if applicable)  is 30 minutes.   Sherrilyn Rist MD [place] [facility] Personal pager: [phone] If unanswered, please page CCM On-call: [phone]

## 2020-05-12 NOTE — Progress Notes (Signed)
[facility] [place]  Subjective  - awake on vent CPAP   Objective 109/75 (!) 101 (!) 100.4 F (38 C) (!) 25 100%  Intake/Output Summary (Last 24 hours) at 05/12/2020 0859 Last data filed at 05/12/2020 0700 Gross per 24 hour  Intake 3780 ml  Output 610 ml  Net 3170 ml   Trach in place Nasal feeding tube Abdomen soft incisions healed Feet pink warm Stool in condom cath  Assessment/Planning: POD #25 coil right internal iliac followed by aorto right femoral aorto to left internal with transposition of left external iliac.  Current problems VDRF and colovesical fisula  Continue vent wean hopefully to trach collar soon  Enteral nutrition  Renal dysfunction resolved  afib eliquis amiodarone  Daughter updated at bedside  Ruta Hinds 05/12/2020 8:59 AM --  Laboratory Lab Results: Recent Labs    05/11/20 0045 05/12/20 0206  WBC 10.2 10.5  HGB 8.2* 8.3*  HCT 26.8* 27.3*  PLT 222 200   BMET Recent Labs    05/11/20 0045 05/12/20 0206  NA 146* 141  K 3.6 4.3  CL 116* 115*  CO2 20* 19*  GLUCOSE 120* 133*  BUN 39* 39*  CREATININE 1.58* 1.55*  CALCIUM 8.8* 8.6*    COAG Lab Results  Component Value Date   INR 1.3 (H) 05/07/2020   INR 1.5 (H) 04/26/2020   INR 1.2 04/17/2020   No results found for: PTT

## 2020-05-12 NOTE — Progress Notes (Signed)
eLink Physician-Brief Progress Note Patient Name: TORRELL KRUTZ DOB: 1940-09-30 MRN: [id number]   Date of Service  05/12/2020  HPI/Events of Note  Agitation - Request to restart Precedex IV infusion.   eICU Interventions  Plan" 1. Low dose Precedex IV infusion (0-0.2 mcg/kg/hour). Titrate to RASS = 0.      Intervention Category Major Interventions: Delirium, psychosis, severe agitation - evaluation and management  Trevyon Swor [place] 05/12/2020, 11:26 PM

## 2020-05-12 NOTE — Progress Notes (Signed)
Spoke with patient and patient's daughter at bedside  Pain and discomfort  Increased Tylenol to 1 g every 6 Xanax 0.25 every 12 hours per tube for anxiety

## 2020-05-12 NOTE — Plan of Care (Signed)
  Problem: Education: Goal: Knowledge of General Education information will improve Description: Including pain rating scale, medication(s)/side effects and non-pharmacologic comfort measures Outcome: Progressing   Problem: Health Behavior/Discharge Planning: Goal: Ability to manage health-related needs will improve Outcome: Progressing   Problem: Clinical Measurements: Goal: Ability to maintain clinical measurements within normal limits will improve Outcome: Progressing Goal: Will remain free from infection Outcome: Progressing Goal: Diagnostic test results will improve Outcome: Progressing Goal: Respiratory complications will improve Outcome: Progressing Goal: Cardiovascular complication will be avoided Outcome: Progressing   Problem: Activity: Goal: Risk for activity intolerance will decrease Outcome: Progressing   Problem: Nutrition: Goal: Adequate nutrition will be maintained Outcome: Progressing   Problem: Coping: Goal: Level of anxiety will decrease Outcome: Progressing   Problem: Elimination: Goal: Will not experience complications related to bowel motility Outcome: Progressing Goal: Will not experience complications related to urinary retention Outcome: Progressing   Problem: Pain Managment: Goal: General experience of comfort will improve Outcome: Progressing   Problem: Safety: Goal: Ability to remain free from injury will improve Outcome: Progressing   Problem: Bowel/Gastric: Goal: Gastrointestinal status for postoperative course will improve Outcome: Progressing   Problem: Cardiac: Goal: Ability to maintain an adequate cardiac output will improve Outcome: Progressing   Problem: Respiratory: Goal: Respiratory status will improve Outcome: Progressing   Problem: Skin Integrity: Goal: Demonstration of wound healing without infection will improve Outcome: Progressing   Problem: Urinary Elimination: Goal: Ability to achieve and maintain adequate  renal perfusion and functioning will improve Outcome: Progressing

## 2020-05-13 DIAGNOSIS — J9601 Acute respiratory failure with hypoxia: Secondary | ICD-10-CM | POA: Diagnosis not present

## 2020-05-13 DIAGNOSIS — E43 Unspecified severe protein-calorie malnutrition: Secondary | ICD-10-CM | POA: Diagnosis not present

## 2020-05-13 DIAGNOSIS — N321 Vesicointestinal fistula: Secondary | ICD-10-CM | POA: Diagnosis not present

## 2020-05-13 LAB — CBC WITH DIFFERENTIAL/PLATELET
Abs Immature Granulocytes: 0.04 10*3/uL (ref 0.00–0.07)
Basophils Absolute: 0 10*3/uL (ref 0.0–0.1)
Basophils Relative: 0 %
Eosinophils Absolute: 0.2 10*3/uL (ref 0.0–0.5)
Eosinophils Relative: 2 %
HCT: 27.9 % — ABNORMAL LOW (ref 39.0–52.0)
Hemoglobin: 8.5 g/dL — ABNORMAL LOW (ref 13.0–17.0)
Immature Granulocytes: 0 %
Lymphocytes Relative: 4 %
Lymphs Abs: 0.4 10*3/uL — ABNORMAL LOW (ref 0.7–4.0)
MCH: 31.7 pg (ref 26.0–34.0)
MCHC: 30.5 g/dL (ref 30.0–36.0)
MCV: 104.1 fL — ABNORMAL HIGH (ref 80.0–100.0)
Monocytes Absolute: 0.8 10*3/uL (ref 0.1–1.0)
Monocytes Relative: 7 %
Neutro Abs: 9.3 10*3/uL — ABNORMAL HIGH (ref 1.7–7.7)
Neutrophils Relative %: 87 %
Platelets: 189 10*3/uL (ref 150–400)
RBC: 2.68 MIL/uL — ABNORMAL LOW (ref 4.22–5.81)
RDW: 16.3 % — ABNORMAL HIGH (ref 11.5–15.5)
WBC Morphology: INCREASED
WBC: 10.7 10*3/uL — ABNORMAL HIGH (ref 4.0–10.5)
nRBC: 0 % (ref 0.0–0.2)

## 2020-05-13 LAB — RENAL FUNCTION PANEL
Albumin: 2 g/dL — ABNORMAL LOW (ref 3.5–5.0)
Anion gap: 8 (ref 5–15)
BUN: 43 mg/dL — ABNORMAL HIGH (ref 8–23)
CO2: 21 mmol/L — ABNORMAL LOW (ref 22–32)
Calcium: 8.7 mg/dL — ABNORMAL LOW (ref 8.9–10.3)
Chloride: 113 mmol/L — ABNORMAL HIGH (ref 98–111)
Creatinine, Ser: 1.62 mg/dL — ABNORMAL HIGH (ref 0.61–1.24)
GFR, Estimated: 43 mL/min — ABNORMAL LOW (ref >60)
Glucose, Bld: 119 mg/dL — ABNORMAL HIGH (ref 70–99)
Phosphorus: 3.2 mg/dL (ref 2.5–4.6)
Potassium: 4.2 mmol/L (ref 3.5–5.1)
Sodium: 142 mmol/L (ref 135–145)

## 2020-05-13 LAB — GLUCOSE, CAPILLARY
Glucose-Capillary: 125 mg/dL — ABNORMAL HIGH (ref 70–99)
Glucose-Capillary: 115 mg/dL — ABNORMAL HIGH (ref 70–99)
Glucose-Capillary: 113 mg/dL — ABNORMAL HIGH (ref 70–99)
Glucose-Capillary: 110 mg/dL — ABNORMAL HIGH (ref 70–99)
Glucose-Capillary: 104 mg/dL — ABNORMAL HIGH (ref 70–99)
Glucose-Capillary: 99 mg/dL (ref 70–99)

## 2020-05-13 LAB — CULTURE, RESPIRATORY W GRAM STAIN

## 2020-05-13 MED ORDER — ALPRAZOLAM 0.25 MG PO TABS
0.2500 mg | ORAL_TABLET | Freq: Three times a day (TID) | ORAL | Status: DC
  Administered 2020-05-13 – 2020-05-14 (×6): 0.25 mg
  Filled 2020-05-13 (×6): qty 1

## 2020-05-13 MED ORDER — CEFAZOLIN SODIUM-DEXTROSE 2-4 GM/100ML-% IV SOLN
2.0000 g | Freq: Three times a day (TID) | INTRAVENOUS | Status: DC
  Administered 2020-05-13 – 2020-05-14 (×2): 2 g via INTRAVENOUS
  Filled 2020-05-13 (×3): qty 100

## 2020-05-13 MED ORDER — ACETAMINOPHEN 160 MG/5ML PO SOLN
1000.0000 mg | Freq: Four times a day (QID) | ORAL | Status: DC | PRN
  Administered 2020-05-14 – 2020-05-30 (×25): 1000 mg
  Filled 2020-05-13 (×25): qty 40.6

## 2020-05-13 NOTE — Progress Notes (Signed)
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 Not sure if any imaging for source of leak would change clinical plan would change treatment plan will defer to urology  Pain control per CCM tramadol d/w daughter and Dr Ander Slade  Vent wean per CCM  Severe protein calorie malnutrition on tube feeds will check prealbumin and zinc to make sure we optimize wound healing  Daughter updated at bedside  Ruta Hinds 05/13/2020 8:39 AM --  Laboratory Lab Results: Recent Labs    05/12/20 0206 05/13/20 0037  WBC 10.5 10.7*  HGB 8.3* 8.5*  HCT 27.3* 27.9*  PLT 200 189   BMET Recent Labs    05/12/20 0206 05/13/20 0037  NA 141 142  K 4.3 4.2  CL 115* 113*  CO2 19* 21*  GLUCOSE 133* 119*  BUN 39* 43*  CREATININE 1.55* 1.62*  CALCIUM 8.6* 8.7*    COAG Lab Results  Component Value Date   INR 1.3 (H) 05/07/2020   INR 1.5 (H) 04/26/2020   INR 1.2 04/17/2020   No results found for: PTT

## 2020-05-13 NOTE — Progress Notes (Signed)
SLP Cancellation Note  Patient Details Name: Carlos Olson MRN: [id number] DOB: [birth year]   Cancelled treatment:       Reason Eval/Treat Not Completed: Patient not medically ready. Pt remains on vent at this time. SLP will follow up for evaluations when pt is weaned to trach collar or assess need/readiness for in line PMV.  Lin Hackmann I. Hardin Negus, [place], [place] Office number [phone] Pager [place] 05/13/2020, 2:41 PM

## 2020-05-13 NOTE — Progress Notes (Signed)
NAME:  Carlos Olson, MRN:  [id number], DOB:  10-29-1940, LOS: 95 ADMISSION DATE:  04/17/2020, CONSULTATION DATE: 10/21 REFERRING MD: Dr. Donzetta Matters, CHIEF COMPLAINT: AKI with mild hypoxia  Brief History   79 year old male who presented 10/18 for elective endovascular repair abdominal aortic aneurysm with Dr. Donzetta Matters. PCCM consulted 10/21 for assistance in medical management.  Hospital course complicated by AKI, metabolic encephalopathy, hypoxia/ RLL pneumonia, and hypotension. Treated in ICU for sepsis, AFRVR, underwent cardioversion, ABX course. Moved out of ICU. Ongoing course significant for AKI, AF, debility. Then 11/2 he became encephalopathic overnight and was transferred back to ICU. PCCM re-consulted.   Past Medical History  AAA History of prostate cancer Heart murmur Diverticulitis COPD CAD atherosclerosis  Significant Hospital Events   Admitted 10/18 for elective AAA repair 10/19 OR   Consults:  Cardiology PCCM Nephrology GI  Procedures:  Endovascular AAA repair 10/19 Tracheostomy tube placement 11/10  Significant Diagnostic Tests:  04/21/2020 CT A/P > Status post abdominal aortic repair with aortobifemoral bypass graft. Some stranding is noted in the operative site likely related to postoperative change. No sizable hematoma is seen. Diverticulosis without diverticulitis. Near complete consolidation of the right lower lobe. Small left pleural effusion is seen with left basilar atelectasis. 10/25 CTH > 1. Stable.  No acute intracranial abnormality. 2. Atrophy with chronic small vessel white matter ischemic disease.  Micro Data:  Blood 10/28 > negative Sputum 10/28 > few candida Tracheal aspirate 11/12-few Staph aureus, pan sensitive  Antimicrobials:  10/19 cefazolin 10/22 zosyn > 10/26 Vancomycin 10/29 > 11/2  Zosyn 10/29 > 11/3 Fluconazole 10/29 >>> Zyvox 11/12  Interim history/subjective:  T-max 100.7 Slept okay, was on Precedex last night Pain is fairly well  controlled this morning  Objective   Blood pressure 106/62, pulse (!) 101, temperature 98.4 F (36.9 C), temperature source Oral, resp. rate (!) 30, height 6\' 3"  (1.905 m), weight 100.2 kg, SpO2 99 %.    Vent Mode: PRVC FiO2 (%):  [40 %] 40 % Set Rate:  [26 bmp] 26 bmp Vt Set:  [630 mL] 630 mL PEEP:  [5 cmH20] 5 cmH20 Plateau Pressure:  [16 cmH20-19 cmH20] 18 cmH20   Intake/Output Summary (Last 24 hours) at 05/13/2020 1008 Last data filed at 05/13/2020 0800 Gross per 24 hour  Intake 5024.67 ml  Output 675 ml  Net 4349.67 ml   Filed Weights   05/10/20 0600 05/12/20 0448 05/13/20 0400  Weight: 95.8 kg 101 kg 100.2 kg   Examination: Elderly, does not appear to be in distress HEENT: Size 6 tracheostomy tube in place, trach site looks fair Pulm: Decreased air entry at the bases, no added sounds Card: S1-S2 appreciated Abdomen: Soft, bowel sounds appreciated Extremities: Good bulk Neuro intact  GU   Resolved Hospital Problem list   Hypotension due to hypovolemia  RLL pneumonia Assessment & Plan:   Acute hypercarbic respiratory failure Status post tracheostomy 11/10 Significant secretions showing staph aureus -Staph aureus is pansensitive -Switch to cefazolin  Mobilization as tolerated Continue full ventilator support Wean as tolerated  Background history of COPD -Continue bronchodilators  Combination of metabolic and toxic encephalopathy -Avoid narcotics -As needed Precedex  Acute kidney injury -Avoid nephrotoxic's -Trend electrolytes  Atrial fibrillation -On amiodarone -On Lopressor -DOAC  Anemia  History of pulmonary nodule -To follow as outpatient  Recent repair of abdominal aortic aneurysm -Followed by vascular surgery  Malnutrition -Continue tube feeds  Discussed with daughter at bedside Trying to optimize pain control with higher dose of Tylenol  and Xanax to be used as needed Discussion continues to revolve around patient being very  sensitive to opiates Unfortunately, tramadol will not be an option for him- still an opioid  Best practice:  Diet: Continue enteral nutrition Pain/Anxiety/Delirium protocol (if indicated): Precedex to RASS -1. VAP protocol (if indicated): In place DVT prophylaxis: Eliquis GI prophylaxis: PPI Glucose control: euglycemic on no coverage Mobility: PT as tolerated Code Status: Full Family Communication: Updated daughter at bedside  The patient is critically ill with multiple organ systems failure and requires high complexity decision making for assessment and support, frequent evaluation and titration of therapies, application of advanced monitoring technologies and extensive interpretation of multiple databases. Critical Care Time devoted to patient care services described in this note independent of APP/resident time (if applicable)  is 30 minutes.   Sherrilyn Rist MD [facility] Personal pager: [phone] If unanswered, please page CCM On-call: [phone]

## 2020-05-14 DIAGNOSIS — J15211 Pneumonia due to Methicillin susceptible Staphylococcus aureus: Secondary | ICD-10-CM | POA: Diagnosis not present

## 2020-05-14 DIAGNOSIS — Z930 Tracheostomy status: Secondary | ICD-10-CM

## 2020-05-14 DIAGNOSIS — J9601 Acute respiratory failure with hypoxia: Secondary | ICD-10-CM | POA: Diagnosis not present

## 2020-05-14 LAB — RENAL FUNCTION PANEL
Albumin: 1.8 g/dL — ABNORMAL LOW (ref 3.5–5.0)
Anion gap: 12 (ref 5–15)
BUN: 48 mg/dL — ABNORMAL HIGH (ref 8–23)
CO2: 18 mmol/L — ABNORMAL LOW (ref 22–32)
Calcium: 8.6 mg/dL — ABNORMAL LOW (ref 8.9–10.3)
Chloride: 109 mmol/L (ref 98–111)
Creatinine, Ser: 1.56 mg/dL — ABNORMAL HIGH (ref 0.61–1.24)
GFR, Estimated: 45 mL/min — ABNORMAL LOW (ref >60)
Glucose, Bld: 108 mg/dL — ABNORMAL HIGH (ref 70–99)
Phosphorus: 3.5 mg/dL (ref 2.5–4.6)
Potassium: 4.5 mmol/L (ref 3.5–5.1)
Sodium: 139 mmol/L (ref 135–145)

## 2020-05-14 LAB — CBC WITH DIFFERENTIAL/PLATELET
Abs Immature Granulocytes: 0.06 10*3/uL (ref 0.00–0.07)
Basophils Absolute: 0 10*3/uL (ref 0.0–0.1)
Basophils Relative: 0 %
Eosinophils Absolute: 0.4 10*3/uL (ref 0.0–0.5)
Eosinophils Relative: 4 %
HCT: 25.3 % — ABNORMAL LOW (ref 39.0–52.0)
Hemoglobin: 7.6 g/dL — ABNORMAL LOW (ref 13.0–17.0)
Immature Granulocytes: 1 %
Lymphocytes Relative: 5 %
Lymphs Abs: 0.5 10*3/uL — ABNORMAL LOW (ref 0.7–4.0)
MCH: 30.9 pg (ref 26.0–34.0)
MCHC: 30 g/dL (ref 30.0–36.0)
MCV: 102.8 fL — ABNORMAL HIGH (ref 80.0–100.0)
Monocytes Absolute: 0.8 10*3/uL (ref 0.1–1.0)
Monocytes Relative: 8 %
Neutro Abs: 8.1 10*3/uL — ABNORMAL HIGH (ref 1.7–7.7)
Neutrophils Relative %: 82 %
Platelets: 200 10*3/uL (ref 150–400)
RBC: 2.46 MIL/uL — ABNORMAL LOW (ref 4.22–5.81)
RDW: 15.9 % — ABNORMAL HIGH (ref 11.5–15.5)
WBC: 10 10*3/uL (ref 4.0–10.5)
nRBC: 0 % (ref 0.0–0.2)

## 2020-05-14 LAB — GLUCOSE, CAPILLARY
Glucose-Capillary: 125 mg/dL — ABNORMAL HIGH (ref 70–99)
Glucose-Capillary: 120 mg/dL — ABNORMAL HIGH (ref 70–99)
Glucose-Capillary: 106 mg/dL — ABNORMAL HIGH (ref 70–99)
Glucose-Capillary: 106 mg/dL — ABNORMAL HIGH (ref 70–99)
Glucose-Capillary: 113 mg/dL — ABNORMAL HIGH (ref 70–99)

## 2020-05-14 LAB — PREALBUMIN: Prealbumin: 11.2 mg/dL — ABNORMAL LOW (ref 18–38)

## 2020-05-14 MED ORDER — CHLORHEXIDINE GLUCONATE 0.12 % MT SOLN
OROMUCOSAL | Status: AC
  Administered 2020-05-14: 15 mL via OROMUCOSAL
  Filled 2020-05-14: qty 15

## 2020-05-14 MED ORDER — SODIUM CHLORIDE 0.9 % IV SOLN
2.0000 g | INTRAVENOUS | Status: DC
  Administered 2020-05-14 – 2020-05-24 (×11): 2 g via INTRAVENOUS
  Filled 2020-05-14: qty 20
  Filled 2020-05-14 (×3): qty 2
  Filled 2020-05-14 (×3): qty 20
  Filled 2020-05-14: qty 2
  Filled 2020-05-14 (×3): qty 20
  Filled 2020-05-14: qty 2

## 2020-05-14 NOTE — Progress Notes (Signed)
Physical Therapy Treatment Patient Details Name: Carlos Olson MRN: [id number] DOB: 13-Nov-1940 Today's Date: 05/14/2020    History of Present Illness 79 year old male who presented 10/18 for elective endovascular repair abdominal aortic aneurysm. Hospital course complicated by AKI, metabolic encephalopathy, acute hypercarbic respiratory failure, hypoxia/ RLL pneumonia, and hypotension. Treated in ICU for sepsis, AFRVR, underwent cardioversion, ABX course. SLP ordered 10/28 given pt coughing with PO liquid intake. MBS 10/29: mild oropharyngeal dysphagia characterized by reduced bolus propulsion, reduced lingual retraction, reduced pharyngeal constriction, and reduced anterior laryngeal movement. Recommended dys 3 diet with thin liquid. Pt became obtunded on 11/2 and was transferred back to ICU. Pt made NPO and Cortrak placed on 11/3, SLP recommended pt remain NPO with water protocol. Pt reintubated 11/5-11/8. CXR 11/9 consistent with previous small bilateral pleural effusions and right lung base aeration.     PT Comments    Pt admitted with above diagnosis. Pt was able to sit EOB 8 min with min to max assist. Pt fatigues quickly and was worn out at end of session dozing off while trying to show wife exercises.  Will continue to progress pt as able.  Pt currently with functional limitations due to balance and endurance deficits. Pt will benefit from skilled PT to increase their independence and safety with mobility to allow discharge to the venue listed below.     Follow Up Recommendations  LTACH;Supervision/Assistance - 24 hour     Equipment Recommendations  3in1 (PT)    Recommendations for Other Services       Precautions / Restrictions Precautions Precautions: Fall Precaution Comments: trach with vent,  flexi seal, Primo fit Restrictions Weight Bearing Restrictions: No    Mobility  Bed Mobility Overal bed mobility: Needs Assistance Bed Mobility: Supine to Sit;Sit to  Supine Rolling: Mod assist Sidelying to sit: +2 for physical assistance;Mod assist;Max assist   Sit to supine: Max assist;Total assist;+2 for physical assistance   General bed mobility comments: Pt needed mod to max assist for bed mobility with incr time to complete tasks. Needed assist with pad to scoot hips to EOB. Needed assist for elevation of trunk and for LEs to EOB. Total asist to lie down due to pt fatigued and lethargic after sitting for EOB 8 min.    Transfers                    Ambulation/Gait                 Stairs             Wheelchair Mobility    Modified Rankin (Stroke Patients Only)       Balance Overall balance assessment: Needs assistance Sitting-balance support: Feet supported;Bilateral upper extremity supported Sitting balance-Leahy Scale: Poor Sitting balance - Comments: Pt was able to sit EOB needing mod to max assist  and progressing to min asssist for up to 30 seconds and then needed min to mod assist for sitting.  Pt fatigued and asked to lie down after 8 min and was closing eyes due to fatigue.                                      Cognition Arousal/Alertness: Awake/alert Behavior During Therapy: Flat affect Overall Cognitive Status: Difficult to assess Area of Impairment: Following commands;Memory;Attention;Problem solving  Current Attention Level: Sustained Memory: Decreased short-term memory Following Commands: Follows one step commands with increased time     Problem Solving: Slow processing;Decreased initiation;Difficulty sequencing;Requires verbal cues General Comments: Pt talking less today.      Exercises General Exercises - Upper Extremity Shoulder Flexion: AAROM;Both;10 reps;Supine Shoulder Extension: AAROM;Both;10 reps;Supine Elbow Flexion: AAROM;Both;10 reps;Supine Elbow Extension: AAROM;Both;10 reps;Supine General Exercises - Lower Extremity Ankle Circles/Pumps:  Both;10 reps;Seated;AAROM Heel Slides: AAROM;Both;10 reps;Supine    General Comments General comments (skin integrity, edema, etc.): VSS with 40% FiO2 and PEEP 5 with RR up to 40 at times with sitting.  Wife educated regarding UE and LE exercises as she wants to do exercise when we arent here. She demonstrated she can assist pt.       Pertinent Vitals/Pain Pain Assessment: Faces Faces Pain Scale: Hurts little more Pain Location: groin bilaterally, abdomen Pain Descriptors / Indicators: Aching;Grimacing;Guarding Pain Intervention(s): Limited activity within patient's tolerance;Monitored during session;Repositioned    Home Living                      Prior Function            PT Goals (current goals can now be found in the care plan section) Acute Rehab PT Goals Patient Stated Goal: to go home Progress towards PT goals: Progressing toward goals    Frequency    Min 2X/week      PT Plan Current plan remains appropriate    Co-evaluation              AM-PAC PT "6 Clicks" Mobility   Outcome Measure  Help needed turning from your back to your side while in a flat bed without using bedrails?: A Lot Help needed moving from lying on your back to sitting on the side of a flat bed without using bedrails?: A Lot Help needed moving to and from a bed to a chair (including a wheelchair)?: Total Help needed standing up from a chair using your arms (e.g., wheelchair or bedside chair)?: Total Help needed to walk in hospital room?: Total Help needed climbing 3-5 steps with a railing? : Total 6 Click Score: 8    End of Session Equipment Utilized During Treatment: Gait belt;Oxygen (trach on vent) Activity Tolerance: Patient limited by fatigue Patient left: with call bell/phone within reach;in bed;with bed alarm set;with family/visitor present Nurse Communication: Mobility status;Need for lift equipment PT Visit Diagnosis: Muscle weakness (generalized) (M62.81);Pain Pain  - part of body:  (back and groin)     Time: 8099-8338 PT Time Calculation (min) (ACUTE ONLY): 25 min  Charges:  $Therapeutic Exercise: 8-22 mins $Therapeutic Activity: 8-22 mins                     Jariah Tarkowski W,PT Acute Rehabilitation Services Pager:  [phone]  Office:  [place] 05/14/2020, 1:49 PM

## 2020-05-14 NOTE — Progress Notes (Signed)
NAME:  Carlos Olson, MRN:  [id number], DOB:  01/03/1941, LOS: 31 ADMISSION DATE:  04/17/2020, CONSULTATION DATE: 10/21 REFERRING MD: Dr. Donzetta Matters, CHIEF COMPLAINT: AKI with mild hypoxia  Brief History   79 year old male who presented 10/18 for elective endovascular repair abdominal aortic aneurysm with Dr. Donzetta Matters. PCCM consulted 10/21 for assistance in medical management.  Hospital course complicated by AKI, metabolic encephalopathy, hypoxia/ RLL pneumonia, and hypotension. Treated in ICU for sepsis, AFRVR, underwent cardioversion, ABX course. Moved out of ICU. Ongoing course significant for AKI, AF, debility. Then 11/2 he became encephalopathic overnight and was transferred back to ICU. PCCM re-consulted.  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  Past Medical History  AAA History of prostate cancer Heart murmur Diverticulitis COPD CAD atherosclerosis  Significant Hospital Events   Admitted 10/18 for elective AAA repair 10/19 OR   Consults:  Cardiology PCCM Nephrology GI  Procedures:  Endovascular AAA repair 10/19 Tracheostomy tube placement 11/10  Significant Diagnostic Tests:  04/21/2020 CT A/P > Status post abdominal aortic repair with aortobifemoral bypass graft. Some stranding is noted in the operative site likely related to postoperative change. No sizable hematoma is seen. Diverticulosis without diverticulitis. Near complete consolidation of the right lower lobe. Small left pleural effusion is seen with left basilar atelectasis. 10/25 CTH > 1. Stable.  No acute intracranial abnormality. 2. Atrophy with chronic small vessel white matter ischemic disease.  Micro Data:  Blood 10/28 > negative Sputum 10/28 > few candida Tracheal aspirate 11/12-few Staph aureus, pan sensitive  Antimicrobials:  10/19 cefazolin 10/22 zosyn > 10/26 Vancomycin  10/29 > 11/2  Zosyn 10/29 > 11/3 Fluconazole 10/29 >>> Zyvox 11/12  Interim history/subjective:   Febrile 101 Remains critically ill, on mechanical ventilation, tracheostomy Remains feculent, +14 L  Objective   Blood pressure 105/69, pulse (!) 109, temperature 99.1 F (37.3 C), temperature source Oral, resp. rate (!) 27, height 6\' 3"  (1.905 m), weight 100.4 kg, SpO2 99 %.    Vent Mode: PRVC FiO2 (%):  [40 %] 40 % Set Rate:  [26 bmp] 26 bmp Vt Set:  [630 mL] 630 mL PEEP:  [5 cmH20] 5 cmH20 Pressure Support:  [12 cmH20] 12 cmH20 Plateau Pressure:  [17 cmH20-23 cmH20] 17 cmH20   Intake/Output Summary (Last 24 hours) at 05/14/2020 1025 Last data filed at 05/14/2020 1000 Gross per 24 hour  Intake 3357.83 ml  Output 1010 ml  Net 2347.83 ml   Filed Weights   05/12/20 0448 05/13/20 0400 05/14/20 0500  Weight: 101 kg 100.2 kg 100.4 kg   Examination: Gen:      elderly man, no distress  HEENT:  EOMI, sclera anicteric, mild pallor Neck:     No JVD; no thyromegaly Lungs:    Bilateral ventilated breath sounds CV:         Regular rate and rhythm; no murmurs Abd:      + bowel sounds; soft, non-tender; no palpable masses, no distension Ext:    No edema; adequate peripheral perfusion Skin:      Warm and dry; no rash Neuro: alert and oriented x 3 , grossly nonfocal  Chest x-ray 11/10 personally reviewed, diffuse bilateral interstitial airspace disease with small effusions.  Labs show stable renal function, normal electrolytes, no leukocytosis, stable anemia   Resolved Hospital Problem list   Hypotension due to hypovolemia  RLL pneumonia Assessment & Plan:   Acute hypercarbic respiratory failure  Status post tracheostomy 11/10  COPD -Continue spontaneous breathing/trach collar trials, tolerating 4 hours daily -Continue bronchodilators  Acute metabolic and toxic encephalopathy -Avoid narcotics -As needed Precedex -Tylenol for fever/pain  Acute kidney injury -Avoid  nephrotoxins   MSSA pneumonia Colovesical fistula -etiology unclear, previous history of radiation for prostate cancer and diverticulosis. GI neurology consultation obtained Further imaging/flex sig deferred for now -We will switch antibiotics from cefazolin to ceftriaxone to cover gram-negative organisms in the urine given fever while on cefazolin  Atrial fibrillation -On amiodarone -On Lopressor -Apixaban  Anemia -follow, transfuse only if hemoglobin less than 7  History of pulmonary nodule noted 02/2020 -To follow as outpatient  Recent repair of abdominal aortic aneurysm -Followed by vascular surgery  Malnutrition -Continue tube feeds  Summary -current goal of weaning him to trach collar being impeded by fevers, concerned that colovesical fistula may be playing a role  Best practice:  Diet: Continue enteral nutrition Pain/Anxiety/Delirium protocol (if indicated): Precedex to RASS -1. VAP protocol (if indicated): In place DVT prophylaxis: Eliquis GI prophylaxis: PPI Glucose control: euglycemic on no coverage Mobility: PT as tolerated Code Status: Full Family Communication: Updated wife at bedside   The patient is critically ill with multiple organ systems failure and requires high complexity decision making for assessment and support, frequent evaluation and titration of therapies, application of advanced monitoring technologies and extensive interpretation of multiple databases. Critical Care Time devoted to patient care services described in this note independent of APP/resident  time is 33 minutes.   Kara Mead MD. [place] Flood. [facility] See Amion for pager  If no response to pager , please call [phone]  After 7:00 pm call Elink  [phone]   05/14/2020

## 2020-05-14 NOTE — Progress Notes (Addendum)
Critical Care Note    05/14/2020 7:32 AM 18 Days Post-Op  Subjective: Bed is in chair position today and he is awake, alert.  His wife is at bedside.  Continues to struggle apparently with bladder discomfort.  Tolerating tube feeds.  Remains on vent.   Vitals:   05/14/20 0600 05/14/20 0700  BP: 112/72 (!) 116/54  Pulse: (!) 112 (!) 116  Resp: (!) 31 (!) 33  Temp:    SpO2: 88% 32%   Systolic PQ:982M EB:583E UOP: 755cc  Physical Exam: Cardiac: RRR Telemetry: Atrial fibrillation Lungs: Clear to auscultation upper anterior lung fields Incisions: Midline continues heal without signs of infection. Right groin incisions continue to heal Extremities: Active range of motion both feet.  Feet are warm well perfused Abdomen: Mildly distended.  Nontender to palpation.  Bowel sounds are present  CBC    Component Value Date/Time   WBC 10.0 05/14/2020 0121   RBC 2.46 (L) 05/14/2020 0121   HGB 7.6 (L) 05/14/2020 0121   HGB 12.1 (L) 08/20/2012 0610   HCT 25.3 (L) 05/14/2020 0121   HCT 44.0 08/02/2012 0853   PLT 200 05/14/2020 0121   PLT 158 08/20/2012 0610   MCV 102.8 (H) 05/14/2020 0121   MCV 99 08/02/2012 0853   MCH 30.9 05/14/2020 0121   MCHC 30.0 05/14/2020 0121   RDW 15.9 (H) 05/14/2020 0121   RDW 13.7 08/02/2012 0853   LYMPHSABS 0.5 (L) 05/14/2020 0121   MONOABS 0.8 05/14/2020 0121   EOSABS 0.4 05/14/2020 0121   BASOSABS 0.0 05/14/2020 0121    BMET    Component Value Date/Time   NA 139 05/14/2020 0121   NA 136 08/20/2012 0610   K 4.5 05/14/2020 0121   K 4.2 08/20/2012 0610   CL 109 05/14/2020 0121   CL 102 08/20/2012 0610   CO2 18 (L) 05/14/2020 0121   CO2 26 08/20/2012 0610   GLUCOSE 108 (H) 05/14/2020 0121   GLUCOSE 123 (H) 08/20/2012 0610   BUN 48 (H) 05/14/2020 0121   BUN 7 08/20/2012 0610   CREATININE 1.56 (H) 05/14/2020 0121   CREATININE 0.75 08/20/2012 0610   CALCIUM 8.6 (L) 05/14/2020 0121   CALCIUM 8.4 (L) 08/20/2012 0610   GFRNONAA 45 (L)  05/14/2020 0121   GFRNONAA >60 08/20/2012 0610   GFRAA >60 05/07/2017 0949   GFRAA >60 08/20/2012 0610     Intake/Output Summary (Last 24 hours) at 05/14/2020 0732 Last data filed at 05/14/2020 0600 Gross per 24 hour  Intake 3759.81 ml  Output 1355 ml  Net 2404.81 ml    HOSPITAL MEDICATIONS Scheduled Meds: . ALPRAZolam  0.25 mg Per Tube TID  . amiodarone  200 mg Per Tube Daily  . apixaban  2.5 mg Per Tube BID  . atorvastatin  10 mg Per Tube QHS  . chlorhexidine gluconate (MEDLINE KIT)  15 mL Mouth Rinse BID  . Chlorhexidine Gluconate Cloth  6 each Topical Daily  . cholestyramine  4 g Per Tube Daily  . darbepoetin (ARANESP) injection - NON-DIALYSIS  100 mcg Subcutaneous Q Mon-1800  . feeding supplement (PROSource TF)  45 mL Per Tube TID  . fiber  1 packet Per Tube BID  . free water  300 mL Per Tube Q4H  . ipratropium-albuterol  3 mL Nebulization TID  . lidocaine  2 patch Transdermal Q24H  . lidocaine  1 application Urethral Once  . mouth rinse  15 mL Mouth Rinse 10 times per day  . metoprolol tartrate  25 mg  Per Tube Q6H  . pantoprazole sodium  40 mg Per Tube Daily   Continuous Infusions: . sodium chloride 10 mL/hr at 05/14/20 0600  .  ceFAZolin (ANCEF) IV Stopped (05/14/20 0558)  . dexmedetomidine (PRECEDEX) IV infusion Stopped (05/13/20 0825)  . feeding supplement (VITAL 1.5 CAL) 1,000 mL (05/13/20 2004)  . phenylephrine (NEO-SYNEPHRINE) Adult infusion Stopped (05/09/20 1602)   PRN Meds:.acetaminophen (TYLENOL) oral liquid 160 mg/5 mL, alum & mag hydroxide-simeth, bisacodyl, Gerhardt's butt cream, naLOXone (NARCAN)  injection, ondansetron, oxymetazoline  Systems Assessment: Hemodynamics: Blood pressure currently stable without support  Pulmonary: Right lower lobe pneumonia> treated.  Cefazolin for staph aureus identified in secretions.  Tracheostomy postoperative day 4.  Ventilated on 40% Fi02.  PEEP 5.  Renal: Acute kidney injury.  Current creatinine 1.56, stable  last several days  Vascular: Incisions well-healed.  Lower extremities well perfused.  On statin and Eliquis.  Hemoglobin down slightly today to 7.6 g/dL  Nutrition: Severe protein malnutrition.  Pre-albumin 11.2.  Tube feeds continue  GI/GU: Colovesical fistula.  Rectal tube now in place.  Condom catheter in place  Cardiac: Atrial fibrillation.  On amiodarone and Eliquis.  Neuro: Encephalopathy.  Efforts at pain control without narcotics continue.  He is awake, alert and following commands today.   Plan: -Continue current treatment plan.  Appreciate all consultants recommendations and management. -DVT prophylaxis: On Eliquis.   Risa Grill, PA-C [facility] [phone] 05/14/2020  7:32 AM  I have independently interviewed and examined patient and agree with PA assessment and plan above.  He is awake and alert today.  Continue trach collar trials critical care medicine much appreciated.  Conservative treatment of colovesicular fistula at this time.  Arnell Mausolf C. Donzetta Matters, MD [facility] [place] Office: [phone] Pager: [phone]

## 2020-05-15 ENCOUNTER — Inpatient Hospital Stay (HOSPITAL_COMMUNITY): Payer: Medicare HMO

## 2020-05-15 DIAGNOSIS — J9601 Acute respiratory failure with hypoxia: Secondary | ICD-10-CM | POA: Diagnosis not present

## 2020-05-15 DIAGNOSIS — N321 Vesicointestinal fistula: Secondary | ICD-10-CM | POA: Diagnosis not present

## 2020-05-15 DIAGNOSIS — J15211 Pneumonia due to Methicillin susceptible Staphylococcus aureus: Secondary | ICD-10-CM | POA: Diagnosis not present

## 2020-05-15 LAB — RENAL FUNCTION PANEL
Albumin: 1.8 g/dL — ABNORMAL LOW (ref 3.5–5.0)
Anion gap: 11 (ref 5–15)
BUN: 52 mg/dL — ABNORMAL HIGH (ref 8–23)
CO2: 20 mmol/L — ABNORMAL LOW (ref 22–32)
Calcium: 8.5 mg/dL — ABNORMAL LOW (ref 8.9–10.3)
Chloride: 110 mmol/L (ref 98–111)
Creatinine, Ser: 1.49 mg/dL — ABNORMAL HIGH (ref 0.61–1.24)
GFR, Estimated: 47 mL/min — ABNORMAL LOW (ref >60)
Glucose, Bld: 119 mg/dL — ABNORMAL HIGH (ref 70–99)
Phosphorus: 3.7 mg/dL (ref 2.5–4.6)
Potassium: 4.9 mmol/L (ref 3.5–5.1)
Sodium: 141 mmol/L (ref 135–145)

## 2020-05-15 LAB — CBC WITH DIFFERENTIAL/PLATELET
Abs Immature Granulocytes: 0.06 10*3/uL (ref 0.00–0.07)
Basophils Absolute: 0 10*3/uL (ref 0.0–0.1)
Basophils Relative: 1 %
Eosinophils Absolute: 0.5 10*3/uL (ref 0.0–0.5)
Eosinophils Relative: 8 %
HCT: 24.9 % — ABNORMAL LOW (ref 39.0–52.0)
Hemoglobin: 7.4 g/dL — ABNORMAL LOW (ref 13.0–17.0)
Immature Granulocytes: 1 %
Lymphocytes Relative: 7 %
Lymphs Abs: 0.5 10*3/uL — ABNORMAL LOW (ref 0.7–4.0)
MCH: 30.5 pg (ref 26.0–34.0)
MCHC: 29.7 g/dL — ABNORMAL LOW (ref 30.0–36.0)
MCV: 102.5 fL — ABNORMAL HIGH (ref 80.0–100.0)
Monocytes Absolute: 0.6 10*3/uL (ref 0.1–1.0)
Monocytes Relative: 9 %
Neutro Abs: 5 10*3/uL (ref 1.7–7.7)
Neutrophils Relative %: 74 %
Platelets: 217 10*3/uL (ref 150–400)
RBC: 2.43 MIL/uL — ABNORMAL LOW (ref 4.22–5.81)
RDW: 15.8 % — ABNORMAL HIGH (ref 11.5–15.5)
WBC: 6.8 10*3/uL (ref 4.0–10.5)
nRBC: 0 % (ref 0.0–0.2)

## 2020-05-15 LAB — GLUCOSE, CAPILLARY
Glucose-Capillary: 108 mg/dL — ABNORMAL HIGH (ref 70–99)
Glucose-Capillary: 117 mg/dL — ABNORMAL HIGH (ref 70–99)
Glucose-Capillary: 107 mg/dL — ABNORMAL HIGH (ref 70–99)
Glucose-Capillary: 138 mg/dL — ABNORMAL HIGH (ref 70–99)
Glucose-Capillary: 108 mg/dL — ABNORMAL HIGH (ref 70–99)
Glucose-Capillary: 103 mg/dL — ABNORMAL HIGH (ref 70–99)

## 2020-05-15 MED ORDER — ARFORMOTEROL TARTRATE 15 MCG/2ML IN NEBU: 15.0000 ug | INHALATION_SOLUTION | Freq: Two times a day (BID) | RESPIRATORY_TRACT | Status: DC

## 2020-05-15 MED ORDER — BUDESONIDE 0.25 MG/2ML IN SUSP
0.2500 mg | Freq: Two times a day (BID) | RESPIRATORY_TRACT | Status: DC
  Administered 2020-05-15 – 2020-05-16 (×2): 0.25 mg via RESPIRATORY_TRACT
  Filled 2020-05-15 (×2): qty 2

## 2020-05-15 MED ORDER — ALPRAZOLAM 0.25 MG PO TABS
0.2500 mg | ORAL_TABLET | Freq: Two times a day (BID) | ORAL | Status: DC
  Administered 2020-05-15 – 2020-05-21 (×13): 0.25 mg
  Filled 2020-05-15 (×13): qty 1

## 2020-05-15 MED ORDER — FREE WATER
200.0000 mL | Status: DC
  Administered 2020-05-15 – 2020-05-17 (×13): 200 mL

## 2020-05-15 MED ORDER — BUDESONIDE 0.25 MG/2ML IN SUSP: 0.2500 mg | Freq: Two times a day (BID) | RESPIRATORY_TRACT | Status: DC

## 2020-05-15 MED ORDER — ARFORMOTEROL TARTRATE 15 MCG/2ML IN NEBU
15.0000 ug | INHALATION_SOLUTION | Freq: Two times a day (BID) | RESPIRATORY_TRACT | Status: DC
  Administered 2020-05-15 – 2020-05-30 (×30): 15 ug via RESPIRATORY_TRACT
  Filled 2020-05-15 (×30): qty 2

## 2020-05-15 NOTE — Progress Notes (Signed)
   05/15/20 1600  Clinical Encounter Type  Visited With Patient and family together  Visit Type Follow-up  Referral From Nurse  Consult/Referral To Chaplain  Spiritual Encounters  Spiritual Needs Emotional  The chaplain visited with the patient and his daughter [name]) to follow up from last week. The patient did not speak during the visit. The daughter is very concerned about her dads progress and wants him to get better. The patient has several things going on medically and the chaplain offered emotional and social support to the daughter and will continue to follow up as needed.

## 2020-05-15 NOTE — Progress Notes (Signed)
NAME:  LENWOOD BALSAM, MRN:  [id number], DOB:  [birth year], LOS: 32 ADMISSION DATE:  04/17/2020, CONSULTATION DATE: 10/21 REFERRING MD: Dr. Donzetta Matters, CHIEF COMPLAINT: AKI with mild hypoxia  Brief History   79 year old male who presented 10/18 for elective endovascular repair abdominal aortic aneurysm with Dr. Donzetta Matters. PCCM consulted 10/21 for assistance in medical management.  Hospital course complicated by AKI, metabolic encephalopathy, hypoxia/ RLL pneumonia, and hypotension. Treated in ICU for sepsis, AFRVR, underwent cardioversion, ABX course. Moved out of ICU. Ongoing course significant for AKI, AF, debility. Then 11/2 he became encephalopathic overnight and was transferred back to ICU. PCCM re-consulted.  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  Past Medical History  AAA History of prostate cancer Heart murmur Diverticulitis COPD CAD atherosclerosis  Significant Hospital Events   Admitted 10/18 for elective AAA repair 10/19 OR  11/15 fever while on cefazolin, changed to ceftriaxone  Consults:  Cardiology PCCM Nephrology GI  Procedures:  Endovascular AAA repair 10/19 Tracheostomy tube placement 11/10  Significant Diagnostic Tests:  04/21/2020 CT A/P > Status post abdominal aortic repair with aortobifemoral bypass graft. Some stranding is noted in the operative site likely related to postoperative change. No sizable hematoma is seen. Diverticulosis without diverticulitis. Near complete consolidation of the right lower lobe. Small left pleural effusion is seen with left basilar atelectasis. 10/25 CTH > 1. Stable.  No acute intracranial abnormality. 2. Atrophy with chronic small vessel white matter ischemic disease.  Micro Data:  Blood 10/28 > negative Sputum 10/28 > few candida Tracheal aspirate 11/12-MSSA 11/15 urine >>  Antimicrobials:  10/19  cefazolin  10/22 zosyn > 10/26 Vancomycin 10/29 > 11/2  Zosyn 10/29 > 11/3 Fluconazole 10/29 >>> Zyvox 11/12 11/15 ceftriaxone >>  Interim history/subjective:   Chronic critically ill, on vent. Defervesced Hemodynamically stable although tachycardic 120s Complains of pain in lower abdomen and when he pees   Objective   Blood pressure (!) 100/51, pulse (!) 111, temperature 99.6 F (37.6 C), temperature source Oral, resp. rate (!) 39, height 6\' 3"  (1.905 m), weight 102.8 kg, SpO2 98 %.    Vent Mode: PSV;CPAP FiO2 (%):  [40 %] 40 % Set Rate:  [26 bmp] 26 bmp Vt Set:  [630 mL] 630 mL PEEP:  [5 cmH20] 5 cmH20 Pressure Support:  [10 JTT01-77 cmH20] 12 cmH20 Plateau Pressure:  [15 cmH20-20 cmH20] 15 cmH20   Intake/Output Summary (Last 24 hours) at 05/15/2020 0946 Last data filed at 05/15/2020 0700 Gross per 24 hour  Intake 2573.76 ml  Output 1100 ml  Net 1473.76 ml   Filed Weights   05/13/20 0400 05/14/20 0500 05/15/20 0400  Weight: 100.2 kg 100.4 kg 102.8 kg   Examination: Gen:      elderly man, no distress  HEENT:  EOMI, sclera anicteric, mild pallor Neck:     No JVD; no thyromegaly Lungs:    Bilateral ventilated breath sounds , no rhonchi CV:         Regular rate and rhythm; no murmurs Abd:      + bowel sounds; soft, non-tender; no palpable masses, no distension Ext:    No edema; adequate peripheral perfusion Skin:      Warm and dry; no rash Neuro: alert and oriented x 3 , weaker in left arm GU -feculent urine  Chest x-ray 11/ 16 independently reviewed, bilateral patchy infiltrates as on prior x-ray from 11/10 Labs show no leukocytosis,  stable anemia   Resolved Hospital Problem list   Hypotension due to hypovolemia  RLL pneumonia Assessment & Plan:   Acute hypercarbic respiratory failure Status post tracheostomy 11/10  COPD -Continue spontaneous breathing/trach collar trials, was tolerating 4 hours daily , currently weaning limited by tachycardia, tolerating  pressure support 12/5 today -Continue bronchodilators  Acute metabolic and toxic encephalopathy -Avoid narcotics -As needed Precedex -Tylenol for fever/pain -Reduced dose of Xanax 2.25 twice daily  Acute kidney injury -Avoid nephrotoxins   MSSA pneumonia Colovesical fistula -etiology unclear, previous history of radiation for prostate cancer and diverticulosis. GI ,urology consultation obtained Further imaging/flex sig deferred for now -Defervesced on ceftriaxone, will likely need long-term antibiotics , await urine sensitivity  Atrial fibrillation -On amiodarone -On Lopressor -Apixaban  Anemia -follow, transfuse only if hemoglobin less than 7  History of pulmonary nodule noted 02/2020 -To follow as outpatient  Recent repair of abdominal aortic aneurysm -Followed by vascular surgery  Malnutrition -Continue tube feeds  Summary -weaning today impeded by tachycardia, wonder if this is being driven by persistent infection from colovesical fistula  Best practice:  Diet: Continue enteral nutrition Pain/Anxiety/Delirium protocol (if indicated): Precedex to RASS -1. VAP protocol (if indicated): In place DVT prophylaxis: Eliquis GI prophylaxis: PPI Glucose control: euglycemic on no coverage Mobility: PT as tolerated Code Status: Full Family Communication: Updated daughter at bedside   The patient is critically ill with multiple organ systems failure and requires high complexity decision making for assessment and support, frequent evaluation and titration of therapies, application of advanced monitoring technologies and extensive interpretation of multiple databases. Critical Care Time devoted to patient care services described in this note independent of APP/resident  time is 32 minutes.    Kara Mead MD. [place] Flood. [facility] See Amion for pager  If no response to pager , please call [phone]  After 7:00 pm call Elink  [phone]    05/15/2020

## 2020-05-15 NOTE — Progress Notes (Signed)
SLP Cancellation Note  Patient Details Name: SULLIVAN JACUINDE MRN: [id number] DOB: 1940/09/14   Cancelled treatment:       Reason Eval/Treat Not Completed: Patient not medically ready.  Reviewed chart. Patient remains on ventilator however attempting to wean. Will continue to f/u for readiness for traditional PMV trials. If patient unable to wean, will consider in-line PMV as patient appears alert and able to participate based on documentation.   Gabriel Rainwater MA, CCC-SLP     Sayaka Hoeppner Meryl 05/15/2020, 9:56 AM

## 2020-05-15 NOTE — Progress Notes (Addendum)
Critical Care Note    05/15/2020 7:01 AM 19 Days Post-Op  Subjective:  Patient awake and following commands. Daughter Collie Siad at bedside. Inquiring about side effects of Xanax. Tolerating TFs. Sat on side of bed for 8 minutes yesterday. Fatigues easily. Vitals:   05/15/20 0500 05/15/20 0600  BP: 118/72 122/69  Pulse: 99 87  Resp: (!) 31 (!) 31  Temp:    SpO2: 53% 97%   Systolic QB:341P-379K WI:097D-532D UOP: 1050cc  Physical Exam: Cardiac: RRR Telemetry: Atrial fibrillation Lungs: Clear to auscultation upper anterior lung fields Incisions: Midline continues heal without signs of infection. Right groin incisions continue to heal Extremities: Active range of motion both feet.  Feet are warm well perfused Abdomen: Mildly distended.  Nontender to palpation.  Bowel sounds are present  NEW LABS PENDING, AM CXR report also pending  Intake/Output Summary (Last 24 hours) at 05/15/2020 0701 Last data filed at 05/15/2020 0654 Gross per 24 hour  Intake 2633.76 ml  Output 1175 ml  Net 1458.76 ml    HOSPITAL MEDICATIONS Scheduled Meds: . ALPRAZolam  0.25 mg Per Tube TID  . amiodarone  200 mg Per Tube Daily  . apixaban  2.5 mg Per Tube BID  . atorvastatin  10 mg Per Tube QHS  . chlorhexidine gluconate (MEDLINE KIT)  15 mL Mouth Rinse BID  . Chlorhexidine Gluconate Cloth  6 each Topical Daily  . cholestyramine  4 g Per Tube Daily  . darbepoetin (ARANESP) injection - NON-DIALYSIS  100 mcg Subcutaneous Q Mon-1800  . feeding supplement (PROSource TF)  45 mL Per Tube TID  . fiber  1 packet Per Tube BID  . free water  300 mL Per Tube Q4H  . ipratropium-albuterol  3 mL Nebulization TID  . lidocaine  2 patch Transdermal Q24H  . lidocaine  1 application Urethral Once  . mouth rinse  15 mL Mouth Rinse 10 times per day  . metoprolol tartrate  25 mg Per Tube Q6H  . pantoprazole sodium  40 mg Per Tube Daily   Continuous Infusions: . sodium chloride Stopped (05/14/20 1506)  .  cefTRIAXone (ROCEPHIN)  IV Stopped (05/14/20 1148)  . dexmedetomidine (PRECEDEX) IV infusion Stopped (05/13/20 0825)  . feeding supplement (VITAL 1.5 CAL) 60 mL/hr at 05/15/20 0100   PRN Meds:.acetaminophen (TYLENOL) oral liquid 160 mg/5 mL, alum & mag hydroxide-simeth, bisacodyl, Gerhardt's butt cream, naLOXone (NARCAN)  injection, ondansetron, oxymetazoline  Systems Assessment: Hemodynamics: Blood pressure currently stable without support  Pulmonary: Right lower lobe MSSA pneumonia> treated.  Now on ceftriaxone for gram negative coverage due to fever.   Tracheostomy postoperative day 5.  Ventilated on 40% Fi02.  PEEP 5.  Renal: Acute kidney injury.  Current creatinine pending, stable last several days  Vascular: Incisions well-healed.  Lower extremities well perfused.  On statin and Eliquis.    Heme: anemia> Hemoglobin pending this am  Nutrition: Severe protein malnutrition.  Pre-albumin 11.2.  Tube feeds continue  GI/GU: Colovesical fistula.  Rectal tube now in place.  Condom catheter in place  Cardiac: Atrial fibrillation.  On amiodarone and Eliquis.  Neuro: Encephalopathy.  Efforts at pain control without narcotics continue.  He is awake, flat affect and following commands today.  Debilitated: Continue PT/OT  Plan: -Follow-up AM labs (RN checking with lab) Continue current treatment plan.  Appreciate all consultants recommendations and management. -DVT prophylaxis: On Eliquis.  Risa Grill, PA-C [facility] [phone] 05/15/2020  7:01 AM   I have independently interviewed and examined patient and  agree with PA assessment and plan above. Reducing xanax today. Continue vent wean. Family updated at bedside.   Drago Hammonds C. Donzetta Matters, MD [facility] [place] Office: [phone] Pager: [phone]

## 2020-05-16 DIAGNOSIS — Y95 Nosocomial condition: Secondary | ICD-10-CM

## 2020-05-16 DIAGNOSIS — J9600 Acute respiratory failure, unspecified whether with hypoxia or hypercapnia: Secondary | ICD-10-CM

## 2020-05-16 DIAGNOSIS — J9621 Acute and chronic respiratory failure with hypoxia: Secondary | ICD-10-CM

## 2020-05-16 DIAGNOSIS — J189 Pneumonia, unspecified organism: Secondary | ICD-10-CM | POA: Diagnosis not present

## 2020-05-16 DIAGNOSIS — N321 Vesicointestinal fistula: Secondary | ICD-10-CM | POA: Diagnosis not present

## 2020-05-16 DIAGNOSIS — L8990 Pressure ulcer of unspecified site, unspecified stage: Secondary | ICD-10-CM | POA: Insufficient documentation

## 2020-05-16 DIAGNOSIS — J9601 Acute respiratory failure with hypoxia: Secondary | ICD-10-CM | POA: Diagnosis not present

## 2020-05-16 LAB — CBC WITH DIFFERENTIAL/PLATELET
Abs Immature Granulocytes: 0.08 10*3/uL — ABNORMAL HIGH (ref 0.00–0.07)
Basophils Absolute: 0 10*3/uL (ref 0.0–0.1)
Basophils Relative: 1 %
Eosinophils Absolute: 0.6 10*3/uL — ABNORMAL HIGH (ref 0.0–0.5)
Eosinophils Relative: 9 %
HCT: 25.3 % — ABNORMAL LOW (ref 39.0–52.0)
Hemoglobin: 7.4 g/dL — ABNORMAL LOW (ref 13.0–17.0)
Immature Granulocytes: 1 %
Lymphocytes Relative: 11 %
Lymphs Abs: 0.7 10*3/uL (ref 0.7–4.0)
MCH: 30.8 pg (ref 26.0–34.0)
MCHC: 29.2 g/dL — ABNORMAL LOW (ref 30.0–36.0)
MCV: 105.4 fL — ABNORMAL HIGH (ref 80.0–100.0)
Monocytes Absolute: 0.8 10*3/uL (ref 0.1–1.0)
Monocytes Relative: 12 %
Neutro Abs: 4.3 10*3/uL (ref 1.7–7.7)
Neutrophils Relative %: 66 %
Platelets: 235 10*3/uL (ref 150–400)
RBC: 2.4 MIL/uL — ABNORMAL LOW (ref 4.22–5.81)
RDW: 15.8 % — ABNORMAL HIGH (ref 11.5–15.5)
WBC: 6.4 10*3/uL (ref 4.0–10.5)
nRBC: 0 % (ref 0.0–0.2)

## 2020-05-16 LAB — RENAL FUNCTION PANEL
Albumin: 1.8 g/dL — ABNORMAL LOW (ref 3.5–5.0)
Anion gap: 9 (ref 5–15)
BUN: 50 mg/dL — ABNORMAL HIGH (ref 8–23)
CO2: 19 mmol/L — ABNORMAL LOW (ref 22–32)
Calcium: 8.7 mg/dL — ABNORMAL LOW (ref 8.9–10.3)
Chloride: 116 mmol/L — ABNORMAL HIGH (ref 98–111)
Creatinine, Ser: 1.41 mg/dL — ABNORMAL HIGH (ref 0.61–1.24)
GFR, Estimated: 50 mL/min — ABNORMAL LOW (ref >60)
Glucose, Bld: 115 mg/dL — ABNORMAL HIGH (ref 70–99)
Phosphorus: 3.7 mg/dL (ref 2.5–4.6)
Potassium: 4.5 mmol/L (ref 3.5–5.1)
Sodium: 144 mmol/L (ref 135–145)

## 2020-05-16 LAB — GLUCOSE, CAPILLARY
Glucose-Capillary: 112 mg/dL — ABNORMAL HIGH (ref 70–99)
Glucose-Capillary: 112 mg/dL — ABNORMAL HIGH (ref 70–99)
Glucose-Capillary: 119 mg/dL — ABNORMAL HIGH (ref 70–99)
Glucose-Capillary: 112 mg/dL — ABNORMAL HIGH (ref 70–99)
Glucose-Capillary: 106 mg/dL — ABNORMAL HIGH (ref 70–99)
Glucose-Capillary: 98 mg/dL (ref 70–99)
Glucose-Capillary: 126 mg/dL — ABNORMAL HIGH (ref 70–99)

## 2020-05-16 LAB — URINE CULTURE: Culture: >=100000 — AB

## 2020-05-16 MED ORDER — BUDESONIDE 0.5 MG/2ML IN SUSP
0.5000 mg | Freq: Two times a day (BID) | RESPIRATORY_TRACT | Status: DC
  Administered 2020-05-16 – 2020-05-30 (×28): 0.5 mg via RESPIRATORY_TRACT
  Filled 2020-05-16 (×28): qty 2

## 2020-05-16 MED ORDER — IPRATROPIUM-ALBUTEROL 0.5-2.5 (3) MG/3ML IN SOLN: 3.0000 mL | Freq: Four times a day (QID) | RESPIRATORY_TRACT | Status: DC | PRN

## 2020-05-16 NOTE — Plan of Care (Signed)
  Problem: Clinical Measurements: Goal: Ability to maintain clinical measurements within normal limits will improve Outcome: Progressing Goal: Will remain free from infection Outcome: Progressing Goal: Diagnostic test results will improve Outcome: Progressing Goal: Respiratory complications will improve Outcome: Progressing Goal: Cardiovascular complication will be avoided Outcome: Progressing   Problem: Activity: Goal: Risk for activity intolerance will decrease Outcome: Progressing   Problem: Nutrition: Goal: Adequate nutrition will be maintained Outcome: Progressing   Problem: Coping: Goal: Level of anxiety will decrease Outcome: Progressing   Problem: Pain Managment: Goal: General experience of comfort will improve Outcome: Progressing   Problem: Safety: Goal: Ability to remain free from injury will improve Outcome: Progressing   Problem: Skin Integrity: Goal: Risk for impaired skin integrity will decrease Outcome: Progressing   Problem: Respiratory: Goal: Respiratory status will improve Outcome: Progressing   Problem: Skin Integrity: Goal: Demonstration of wound healing without infection will improve Outcome: Progressing   Problem: Activity: Goal: Ability to tolerate increased activity will improve Outcome: Progressing   Problem: Respiratory: Goal: Ability to maintain a clear airway and adequate ventilation will improve Outcome: Progressing   Problem: Respiratory: Goal: Patent airway maintenance will improve Outcome: Progressing

## 2020-05-16 NOTE — Progress Notes (Addendum)
Progress Note    05/16/2020 7:59 AM 20 Days Post-Op  Subjective:  Receiving chest PT.  Wakes to voice and follows commands.   Tm 99.3 now afebrile HR 90's-110's Afib/ST 41'O-878'M systolic 76% .40 FiO2   Gtts:  None   Vitals:   05/16/20 0700 05/16/20 0754  BP: 106/62 108/70  Pulse: 76 (!) 103  Resp: (!) 26 (!) 33  Temp:    SpO2: 100% 99%    Physical Exam: Cardiac:  regular Lungs:  Non labored on vent with trach.   Incisions:  Laparotomy incision and right groin incisions have healed nicely.   Extremities:  +doppler signals bilateral DP.  Bilateral feet are warm.   Abdomen:  Soft, NT/ND.    CBC    Component Value Date/Time   WBC 6.4 05/16/2020 0325   RBC 2.40 (L) 05/16/2020 0325   HGB 7.4 (L) 05/16/2020 0325   HGB 12.1 (L) 08/20/2012 0610   HCT 25.3 (L) 05/16/2020 0325   HCT 44.0 08/02/2012 0853   PLT 235 05/16/2020 0325   PLT 158 08/20/2012 0610   MCV 105.4 (H) 05/16/2020 0325   MCV 99 08/02/2012 0853   MCH 30.8 05/16/2020 0325   MCHC 29.2 (L) 05/16/2020 0325   RDW 15.8 (H) 05/16/2020 0325   RDW 13.7 08/02/2012 0853   LYMPHSABS 0.7 05/16/2020 0325   MONOABS 0.8 05/16/2020 0325   EOSABS 0.6 (H) 05/16/2020 0325   BASOSABS 0.0 05/16/2020 0325    BMET    Component Value Date/Time   NA 144 05/16/2020 0325   NA 136 08/20/2012 0610   K 4.5 05/16/2020 0325   K 4.2 08/20/2012 0610   CL 116 (H) 05/16/2020 0325   CL 102 08/20/2012 0610   CO2 19 (L) 05/16/2020 0325   CO2 26 08/20/2012 0610   GLUCOSE 115 (H) 05/16/2020 0325   GLUCOSE 123 (H) 08/20/2012 0610   BUN 50 (H) 05/16/2020 0325   BUN 7 08/20/2012 0610   CREATININE 1.41 (H) 05/16/2020 0325   CREATININE 0.75 08/20/2012 0610   CALCIUM 8.7 (L) 05/16/2020 0325   CALCIUM 8.4 (L) 08/20/2012 0610   GFRNONAA 50 (L) 05/16/2020 0325   GFRNONAA >60 08/20/2012 0610   GFRAA >60 05/07/2017 0949   GFRAA >60 08/20/2012 0610    INR    Component Value Date/Time   INR 1.3 (H) 05/07/2020 0435   INR 1.0  08/02/2012 0853     Intake/Output Summary (Last 24 hours) at 05/16/2020 0759 Last data filed at 05/16/2020 0700 Gross per 24 hour  Intake 3630 ml  Output 1300 ml  Net 2330 ml     Assessment/Plan:  79 y.o. male is s/p:  Aortobifemoral bypass grafting  20 Days Post-Op  Cardiac: stable and not requiring pressor support.  Afib/ST-on po amiodarone.  Pt on Eliquis  Pulmonary:Right lower lobe MSSA pneumonia>treated. Now on ceftriaxone for gram negative coverage due to fever.  Tracheostomy postoperative day 5. continues to be ventilated on 40% Fi02.  Vent wean per CCM.   Neuro:  Awake; following commands.    Renal:Acute kidney injury. Creatinine continues to trend downward.  1.41 today  Vascular:Incisions have healed nicely. Lower extremities well perfused with DP doppler signals bilaterally.On statin and Eliquis.   Heme/ID: anemia> acute blood loss anemia-stable and pt tolerating.  hgb stable at 7.4.  Low grade fever yesterday afternoon-improved since. No leukocytosis.  Abx switched to ceftriaxone to cover GN organisms in urine given fever.   Nutrition:Severe protein malnutrition. Pre-albumin 11.2. continue TF  GI/GU: Colovesical fistula.  Further imaging/flex sig deferred for now.   Rectal tube now in place. Condom catheter in place.  Continue TF.  Continue to mobilize and pulmonary PT Appreciate consultants assistance with pt's care.     Leontine Locket, PA-C [facility] [phone] 05/16/2020 7:59 AM  I have independently interviewed and examined patient and agree with PA assessment and plan above. Continue vent wean per ccm.  Garrette Caine C. Donzetta Matters, MD [facility] [place] Office: [phone] Pager: [phone]

## 2020-05-16 NOTE — Progress Notes (Signed)
SLP Cancellation Note  Patient Details Name: Carlos Olson MRN: [id number] DOB: 1940/12/14   Cancelled treatment:       Reason Eval/Treat Not Completed: Patient not medically ready. Pt's case was discussed with RN who indicated that he has only recently been weaned to trach collar and has significant secretions which require frequent suctioning. It was agreed to defer PMSV eval today, but SLP will follow up on subsequent date.   Nakeysha Pasqual I. Hardin Negus, [place], [place] Office number [phone] Pager [phone] 05/16/2020, 4:31 PM

## 2020-05-16 NOTE — Progress Notes (Addendum)
NAME:  Carlos Olson, MRN:  [id number], DOB:  Jan 07, 1941, LOS: 32 ADMISSION DATE:  04/17/2020, CONSULTATION DATE: 10/21 REFERRING MD: Dr. Donzetta Matters, CHIEF COMPLAINT: AKI with mild hypoxia  Brief History   79 year old male who presented 10/18 for elective endovascular repair abdominal aortic aneurysm with Dr. Donzetta Matters. PCCM consulted 10/21 for assistance in medical management.  Hospital course complicated by AKI, metabolic encephalopathy, hypoxia/ RLL pneumonia, and hypotension. Treated in ICU for sepsis, AFRVR, underwent cardioversion, ABX course. Moved out of ICU. Ongoing course significant for AKI, AF, debility. Then 11/2 he became encephalopathic overnight and was transferred back to ICU. PCCM re-consulted.  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  Past Medical History  AAA History of prostate cancer Heart murmur Diverticulitis COPD CAD atherosclerosis  Significant Hospital Events   Admitted 10/18 for elective AAA repair 10/19 OR  11/15 fever while on cefazolin, changed to ceftriaxone  Consults:  Cardiology PCCM Nephrology GI  Procedures:  Endovascular AAA repair 10/19 Tracheostomy tube placement 11/10  Significant Diagnostic Tests:  04/21/2020 CT A/P > Status post abdominal aortic repair with aortobifemoral bypass graft. Some stranding is noted in the operative site likely related to postoperative change. No sizable hematoma is seen. Diverticulosis without diverticulitis. Near complete consolidation of the right lower lobe. Small left pleural effusion is seen with left basilar atelectasis. 10/25 CTH > 1. Stable.  No acute intracranial abnormality. 2. Atrophy with chronic small vessel white matter ischemic disease.  Micro Data:  Blood 10/28 > negative Sputum 10/28 > few candida Tracheal aspirate 11/12-MSSA 11/15 urine-E.Coli>>  Antimicrobials:    10/19 cefazolin  10/22 zosyn > 10/26 Vancomycin 10/29 > 11/2  Zosyn 10/29 > 11/3 Fluconazole 10/29 >>> Zyvox 11/12 11/15 ceftriaxone >>  Interim history/subjective:   Chronic critically ill and on vent, 40% FiO2 Afebrile Large mucus plug Hemodynamically stable,  HR improved 90's overnight.   Objective   Blood pressure 106/62, pulse 76, temperature 98.1 F (36.7 C), temperature source Axillary, resp. rate (!) 26, height 6\' 3"  (1.905 m), weight 96.2 kg, SpO2 100 %.    Vent Mode: PRVC FiO2 (%):  [40 %] 40 % Set Rate:  [26 bmp] 26 bmp Vt Set:  [630 mL] 630 mL PEEP:  [5 cmH20] 5 cmH20 Pressure Support:  [12 cmH20] 12 cmH20 Plateau Pressure:  [16 cmH20-22 cmH20] 18 cmH20   Intake/Output Summary (Last 24 hours) at 05/16/2020 0713 Last data filed at 05/16/2020 0700 Gross per 24 hour  Intake 3630 ml  Output 1300 ml  Net 2330 ml   Filed Weights   05/14/20 0500 05/15/20 0400 05/16/20 0500  Weight: 100.4 kg 102.8 kg 96.2 kg   Examination: General:79yr old make in no apparent distress  HEENT: EOMI, sclera anicteric, mild pallor Neck: nontender, no JVD, no thyromegally Cardiovascular: RRR with no murmurs noted Respiratory: Bilateral ventilated breath sounds, diminished at bases, no rhonchi  Gastrointestinal: Soft,non tender, non distended. Bowel sounds present. No abdominal pain GU: Feculent urine Ext: trace peripheral edema bilaterally, well perfused  Derm: warm and dry Neuro: Reponds to voice and follows commands, weaker in left arm  Labs show no leukocytosis, sight drop in hemoglobin from 7.6 to 7.4   Resolved Hospital Problem list   Hypotension due to hypovolemia  RLL pneumonia Assessment & Plan:   Acute hypercarbic respiratory failure Status post tracheostomy 11/10  COPD -Continue spontaneous breathing/trach collar trials, was tolerating 4 hours daily ,  currently weaning limited by tachycardia, tolerating pressure support 12/5 today -Continue  bronchodilators  Acute metabolic and toxic encephalopathy -Avoid narcotics -As needed Precedex -Tylenol for fever/pain -Continue dose of Xanax 2.25 twice daily, would consider reducing dose as allowed  Acute kidney injury -Avoid nephrotoxins  MSSA pneumonia Colovesical fistula -etiology unclear, previous history of radiation for prostate cancer and diverticulosis. GI ,urology consultation obtained Further imaging/flex sig deferred for now -Defervesced on ceftriaxone, will likely need long-term antibiotics , await urine sensitivity  Atrial fibrillation -On amiodarone -On Lopressor -Apixaban  Anemia -follow, transfuse only if hemoglobin less than 7  History of pulmonary nodule noted 02/2020 -To follow as outpatient  Recent repair of abdominal aortic aneurysm -Followed by vascular surgery  Malnutrition -Continue tube feeds  Summary -weaning today impeded by tachycardia, wonder if this is being driven by persistent infection from colovesical fistula  Best practice:  Diet: Continue enteral nutrition Pain/Anxiety/Delirium protocol (if indicated): Precedex to RASS -1. VAP protocol (if indicated): In place DVT prophylaxis: Eliquis GI prophylaxis: PPI Glucose control: euglycemic on no coverage Mobility: PT as tolerated Code Status: Full Family Communication: Updated daughter at bedside   Carollee Leitz, MD [facility]   05/16/2020

## 2020-05-17 DIAGNOSIS — N321 Vesicointestinal fistula: Secondary | ICD-10-CM | POA: Diagnosis not present

## 2020-05-17 DIAGNOSIS — J9601 Acute respiratory failure with hypoxia: Secondary | ICD-10-CM | POA: Diagnosis not present

## 2020-05-17 LAB — RENAL FUNCTION PANEL
Albumin: 1.8 g/dL — ABNORMAL LOW (ref 3.5–5.0)
Anion gap: 11 (ref 5–15)
BUN: 42 mg/dL — ABNORMAL HIGH (ref 8–23)
CO2: 21 mmol/L — ABNORMAL LOW (ref 22–32)
Calcium: 8.7 mg/dL — ABNORMAL LOW (ref 8.9–10.3)
Chloride: 114 mmol/L — ABNORMAL HIGH (ref 98–111)
Creatinine, Ser: 1.18 mg/dL (ref 0.61–1.24)
GFR, Estimated: >60 mL/min (ref >60)
Glucose, Bld: 118 mg/dL — ABNORMAL HIGH (ref 70–99)
Phosphorus: 2.4 mg/dL — ABNORMAL LOW (ref 2.5–4.6)
Potassium: 4.3 mmol/L (ref 3.5–5.1)
Sodium: 146 mmol/L — ABNORMAL HIGH (ref 135–145)

## 2020-05-17 LAB — CBC WITH DIFFERENTIAL/PLATELET
Abs Immature Granulocytes: 0.25 10*3/uL — ABNORMAL HIGH (ref 0.00–0.07)
Basophils Absolute: 0.1 10*3/uL (ref 0.0–0.1)
Basophils Relative: 1 %
Eosinophils Absolute: 0.6 10*3/uL — ABNORMAL HIGH (ref 0.0–0.5)
Eosinophils Relative: 7 %
HCT: 29.5 % — ABNORMAL LOW (ref 39.0–52.0)
Hemoglobin: 9.1 g/dL — ABNORMAL LOW (ref 13.0–17.0)
Immature Granulocytes: 3 %
Lymphocytes Relative: 8 %
Lymphs Abs: 0.8 10*3/uL (ref 0.7–4.0)
MCH: 31.6 pg (ref 26.0–34.0)
MCHC: 30.8 g/dL (ref 30.0–36.0)
MCV: 102.4 fL — ABNORMAL HIGH (ref 80.0–100.0)
Monocytes Absolute: 0.8 10*3/uL (ref 0.1–1.0)
Monocytes Relative: 9 %
Neutro Abs: 6.5 10*3/uL (ref 1.7–7.7)
Neutrophils Relative %: 72 %
Platelets: 272 10*3/uL (ref 150–400)
RBC: 2.88 MIL/uL — ABNORMAL LOW (ref 4.22–5.81)
RDW: 15.8 % — ABNORMAL HIGH (ref 11.5–15.5)
WBC: 9 10*3/uL (ref 4.0–10.5)
nRBC: 0 % (ref 0.0–0.2)

## 2020-05-17 LAB — GLUCOSE, CAPILLARY
Glucose-Capillary: 131 mg/dL — ABNORMAL HIGH (ref 70–99)
Glucose-Capillary: 125 mg/dL — ABNORMAL HIGH (ref 70–99)
Glucose-Capillary: 108 mg/dL — ABNORMAL HIGH (ref 70–99)
Glucose-Capillary: 113 mg/dL — ABNORMAL HIGH (ref 70–99)

## 2020-05-17 LAB — ZINC: Zinc: 64 ug/dL (ref 44–115)

## 2020-05-17 MED ORDER — APIXABAN 5 MG PO TABS
5.0000 mg | ORAL_TABLET | Freq: Two times a day (BID) | ORAL | Status: DC
  Administered 2020-05-17 – 2020-05-30 (×26): 5 mg
  Filled 2020-05-17 (×12): qty 1
  Filled 2020-05-17: qty 2
  Filled 2020-05-17 (×13): qty 1

## 2020-05-17 MED ORDER — FREE WATER
250.0000 mL | Status: DC
  Administered 2020-05-17 – 2020-05-18 (×5): 250 mL

## 2020-05-17 NOTE — Evaluation (Signed)
[facility] - Evaluation Patient Details  Name: Carlos Olson MRN: [id number] Date of Birth: Dec 10, 1940  Today's Date: 05/17/2020 Time: 1700-1715 SLP Time Calculation (min) (ACUTE ONLY): 15 min  Past Medical History:  Past Medical History:  Diagnosis Date  . AAA (abdominal aortic aneurysm) ([place])   . Atherosclerosis of aortic arch ([place]) 09/25/2015   By CT scan   . CAD (coronary artery disease) 09/25/2015   Mild 3v by CT scan   . Cancer [facility])    prostate  . Colon polyps   . COPD (chronic obstructive pulmonary disease) ([place])   . Diverticulosis of colon   . Femoral bruit    bilateral, found by Dr. Jefm Bryant  . Heart murmur    child  . History of prostate cancer   . IBS (irritable bowel syndrome)    improved after retirement  . 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 Pelvic fracture ([place])   . Pneumonia    child  . Pulmonary nodules 09/25/2015   On screening CT scan, rpt 1 yr   . Shortness of breath    doe   Past Surgical History:  Past Surgical History:  Procedure Laterality Date  . ANTERIOR CERVICAL DECOMP/DISCECTOMY FUSION  07/29/2011   Procedure: ANTERIOR CERVICAL DECOMPRESSION/DISCECTOMY FUSION 1 LEVEL;  Surgeon: Jessy Oto, MD;  Location: [place];  Service: Orthopedics;  Laterality: N/A;  Anterior Cervical Discectomy and Fusion C3-4  . AORTA - BILATERAL FEMORAL ARTERY BYPASS GRAFT N/A 04/17/2020   Procedure: AORTA BIFEMORAL BYPASS GRAFT USING A HEMASHIELD GOLD BIFURCATED GRAFT;  Surgeon: Waynetta Sandy, MD;  Location: [place];  Service: Vascular;  Laterality: N/A;  . BUBBLE STUDY  04/26/2020   Procedure: BUBBLE STUDY;  Surgeon: Freada Bergeron, MD;  Location: [place];  Service: Cardiovascular;;  . CARDIOVERSION N/A 04/26/2020   Procedure: CARDIOVERSION;  Surgeon: Freada Bergeron, MD;  Location: [facility] ENDOSCOPY;  Service: Cardiovascular;  Laterality: N/A;  . CATARACT EXTRACTION W/PHACO Right  07/10/2015   Procedure: CATARACT EXTRACTION PHACO AND INTRAOCULAR LENS PLACEMENT ([place]);  Surgeon: Birder Robson, MD;  Location: [facility] ORS;  Service: Ophthalmology;  Laterality: Right;  [place] 00:58   . CATARACT EXTRACTION W/PHACO Left 07/24/2015   Procedure: CATARACT EXTRACTION PHACO AND INTRAOCULAR LENS PLACEMENT (I[facility]);  Surgeon: Birder Robson, MD;  Location: [facility];  Service: Ophthalmology;  Laterality: Left;  [place] 00:38   . COLONOSCOPY  12/2007   [place], diverticulosis and polyp, rpt 5 yrs  . dental implants    . EMBOLIZATION Right 03/26/2020   Procedure: EMBOLIZATION;  Surgeon: Waynetta Sandy, MD;  Location: [facility];  Service: Cardiovascular;  Laterality: Right;  . FORAMINOTOMY 2 LEVEL Left 10/2013   C4/5, C5/6 ([place])  . HERNIA REPAIR     right 05/1999, left 09/04/05  . JOINT REPLACEMENT Right    TKR  . KNEE ARTHROPLASTY Left 05/20/2017   Procedure: COMPUTER ASSISTED TOTAL KNEE ARTHROPLASTY;  Surgeon: Dereck Leep, MD;  Location: [facility];  Service: Orthopedics;  Laterality: Left;  . KNEE ARTHROSCOPY  09/1997   left  . POSTERIOR CERVICAL FUSION/FORAMINOTOMY N/A 11/22/2013   Procedure: LEFT C4-5 AND C5-6 FORAMINOTOMY;  Surgeon: Jessy Oto, MD;  Location: [place];  Service: Orthopedics;  Laterality: N/A;  . PROSTATECTOMY  08/1990  . 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 TEE WITHOUT CARDIOVERSION N/A 04/26/2020   Procedure: TRANSESOPHAGEAL ECHOCARDIOGRAM (TEE);  Surgeon: Freada Bergeron, MD;  Location: [facility] ENDOSCOPY;  Service: Cardiovascular;  Laterality: N/A;  . TONSILLECTOMY     HPI:  Pt is an 79 year old male who presented 10/18 for elective endovascular repair abdominal aortic aneurysm. Hospital course complicated by AKI, metabolic encephalopathy, acute hypercarbic respiratory failure, hypoxia/ RLL pneumonia, and hypotension. Treated in ICU for sepsis, AFRVR, underwent cardioversion, ABX course. SLP ordered 10/28 given pt coughing with PO liquid intake. MBS  10/29: mild oropharyngeal dysphagia characterized by reduced bolus propulsion, reduced lingual retraction, reduced pharyngeal constriction, and reduced anterior laryngeal movement. Recommended dys 3 diet with thin liquid. Pt became obtunded on 11/2 and was transferred back to ICU. Pt made NPO and Cortrak placed on 11/3, SLP recommended pt remain NPO with water protocol. Pt reintubated 11/5-11/8. CXR 11/9 consistent with previous small bilateral pleural effusions and right lung base aeration. Pt had repeat bedside swallow evaluation on 11/9 and was asymptomatic for aspiration but demonstrated a baseline congested cough and increased work of breathing. Pt was re-intubated shortly after swallow evaluation and trach was placed on 11/10. CXR 11/16: Diffuse bilateral pulmonary infiltrates/edema with slight improvement.   Assessment / Plan / Recommendation Clinical Impression  Pt was seen for PMSV evaluation with his daughter present. He was alert and cooperative throughout the evaluation but stated that he was tired. Pt was educated regarding the anatomy of the larynx, the impact of the trach on voicing, and the goals of the session. Pt indicated understanding. Cuff was inflated upon SLP's arrival and pt tolerated cuff deflation well. Pt exhibited coughing upon deflation and was able to able to expectorate secretions and mucous via the trach. He presented with vitals of RR 21, SpO2 91, and HR 107 upon cuff deflation. Pt tolerated finger occlusion and intermitent PMSV placement without evidence of air trapping. He tolerated PMSV for 20 minutes including the time it was placed for the swallow evaluation with vitals ranging RR 25-31, SpO2 95-96, and HR 100-104. Vocal intensity was reduced and pt required frequent cues for voicing. He exhibited difficulty coordinating respiration with speech at the phrase and sentence levels but was inconsistently responsive to cueing to increase breath support and vocal intensity. PMSV  was removed at the end of session due to increased work of breathing and cuff was re-inflated. It is recommended that PMSV be used only SLP at this time. SLP will continue to follow pt. SLP Visit Diagnosis: Aphonia (R49.1)    SLP Assessment  Patient needs continued Speech Lanaguage Pathology Services    Follow Up Recommendations  LTACH    Frequency and Duration min 2x/week  2 weeks    PMSV Trial PMSV was placed for: 20 Able to redirect subglottic air through upper airway: Yes Able to Attain Phonation: Yes Voice Quality: Low vocal intensity Able to Expectorate Secretions: Yes Level of Secretion Expectoration with PMSV: Tracheal Breath Support for Phonation: Adequate Intelligibility: Intelligibility reduced Word: 75-100% accurate Phrase: 50-74% accurate Sentence: 25-49% accurate Conversation: 0-24% accurate Respirations During Trial:  (21-31) SpO2 During Trial:  (91-95) Pulse During Trial:  (100-107) Behavior: Alert;Cooperative;Good eye contact;Responsive to questions   Tracheostomy Tube       Vent Dependency  FiO2 (%): 28 %    Cuff Deflation Trial     Anneke Cundy I. Hardin Negus, MS, [place] Office number [phone] Pager [phone]  Tolerated Cuff Deflation: Yes Length of Time for Cuff Deflation Trial: 20 Behavior: Alert;Cooperative;Good eye contact;Responsive to questions Cuff Deflation Trial - Comments: 20        Horton Marshall 05/17/2020, 5:51 PM

## 2020-05-17 NOTE — Progress Notes (Addendum)
AAA Progress Note    05/17/2020 7:36 AM 21 Days Post-Op  Subjective:  More awake this morning; daughter at bedside  Afebrile HR 90's-130's NSR>afib 326'Z-124'P systolic 80% .40 FiO2  Gtts:  none  Vitals:   05/17/20 0600 05/17/20 0700  BP: 108/64 (!) 118/50  Pulse: 100 (!) 106  Resp: (!) 26 (!) 33  Temp:    SpO2: 100% 99%    Physical Exam: Cardiac:  Irregular; back in afib last night Lungs:  Tolerated TC ~ 12 hrs yesterday.  Continue vent wean per CCM Abdomen:  Soft, NT Incisions:  Midline and right groin are clean and healing nicely Extremities:  Palpable left PT and +doppler signal right PT General:  More alert this am  CBC    Component Value Date/Time   WBC 9.0 05/17/2020 0036   RBC 2.88 (L) 05/17/2020 0036   HGB 9.1 (L) 05/17/2020 0036   HGB 12.1 (L) 08/20/2012 0610   HCT 29.5 (L) 05/17/2020 0036   HCT 44.0 08/02/2012 0853   PLT 272 05/17/2020 0036   PLT 158 08/20/2012 0610   MCV 102.4 (H) 05/17/2020 0036   MCV 99 08/02/2012 0853   MCH 31.6 05/17/2020 0036   MCHC 30.8 05/17/2020 0036   RDW 15.8 (H) 05/17/2020 0036   RDW 13.7 08/02/2012 0853   LYMPHSABS 0.8 05/17/2020 0036   MONOABS 0.8 05/17/2020 0036   EOSABS 0.6 (H) 05/17/2020 0036   BASOSABS 0.1 05/17/2020 0036    BMET    Component Value Date/Time   NA 146 (H) 05/17/2020 0036   NA 136 08/20/2012 0610   K 4.3 05/17/2020 0036   K 4.2 08/20/2012 0610   CL 114 (H) 05/17/2020 0036   CL 102 08/20/2012 0610   CO2 21 (L) 05/17/2020 0036   CO2 26 08/20/2012 0610   GLUCOSE 118 (H) 05/17/2020 0036   GLUCOSE 123 (H) 08/20/2012 0610   BUN 42 (H) 05/17/2020 0036   BUN 7 08/20/2012 0610   CREATININE 1.18 05/17/2020 0036   CREATININE 0.75 08/20/2012 0610   CALCIUM 8.7 (L) 05/17/2020 0036   CALCIUM 8.4 (L) 08/20/2012 0610   GFRNONAA >60 05/17/2020 0036   GFRNONAA >60 08/20/2012 0610   GFRAA >60 05/07/2017 0949   GFRAA >60 08/20/2012 0610    INR    Component Value Date/Time   INR 1.3 (H)  05/07/2020 0435   INR 1.0 08/02/2012 0853     Intake/Output Summary (Last 24 hours) at 05/17/2020 0736 Last data filed at 05/17/2020 0700 Gross per 24 hour  Intake 1520 ml  Output 2970 ml  Net -1450 ml     Assessment/Plan:  79 y.o. male is s/p  Aortobifemoral bypass grafting 30 Days Post-Op  -Vascular:  Palpable left PT and + doppler right PT -Cardiac:  Went back in to afib last evening-pt on lopressor, amio and Eliquis -Pulmonary:  Tolerated TC ~ 12 hrs yesterday.  Back on vent overnight for rest.  Wean per CCM -Neuro:  Awake and following commands -Renal:  Creatinine improved to 1.1 today & K+ is 4.3 -GI:  Abdomen is soft, NT; colovesical fistula-Further imaging/flex sig deferred for now.   Rectal tube now in place. Condom catheter in place.  Continue TF. -Incisions:  Healing nicely -Heme/ID:  Acute blood loss anemia improved today with hgb of 9.1.  No fever and no leukocytosis.  IV ceftriaxone for 10 days total and then consider oral Keflex for chronic suppressive therapy -General:  More alert this morning; no distress.  Wanting to  write something.     Leontine Locket, PA-C [facility] [phone] 05/17/2020 7:36 AM    I have interviewed and examined patient with PA and agree with assessment and plan above.  Daughter updated at bedside.  Lewin Pellow C. Donzetta Matters, MD [facility] [place] Office: [phone] Pager: [phone]

## 2020-05-17 NOTE — Plan of Care (Signed)
  Problem: Education: Goal: Knowledge of General Education information will improve Description: Including pain rating scale, medication(s)/side effects and non-pharmacologic comfort measures Outcome: Progressing   Problem: Health Behavior/Discharge Planning: Goal: Ability to manage health-related needs will improve Outcome: Progressing   Problem: Clinical Measurements: Goal: Ability to maintain clinical measurements within normal limits will improve Outcome: Progressing Goal: Will remain free from infection Outcome: Progressing Goal: Diagnostic test results will improve Outcome: Progressing Goal: Respiratory complications will improve Outcome: Progressing   Problem: Activity: Goal: Risk for activity intolerance will decrease Outcome: Progressing   Problem: Nutrition: Goal: Adequate nutrition will be maintained Outcome: Progressing   Problem: Coping: Goal: Level of anxiety will decrease Outcome: Progressing   Problem: Elimination: Goal: Will not experience complications related to urinary retention Outcome: Progressing   Problem: Pain Managment: Goal: General experience of comfort will improve Outcome: Progressing   Problem: Safety: Goal: Ability to remain free from injury will improve Outcome: Progressing   Problem: Skin Integrity: Goal: Risk for impaired skin integrity will decrease Outcome: Progressing   Problem: Education: Goal: Knowledge of the prescribed therapeutic regimen will improve Outcome: Progressing   Problem: Respiratory: Goal: Respiratory status will improve Outcome: Progressing   Problem: Skin Integrity: Goal: Demonstration of wound healing without infection will improve Outcome: Progressing   Problem: Urinary Elimination: Goal: Ability to achieve and maintain adequate renal perfusion and functioning will improve Outcome: Progressing   Problem: Activity: Goal: Ability to tolerate increased activity will improve Outcome: Progressing    Problem: Respiratory: Goal: Ability to maintain a clear airway and adequate ventilation will improve Outcome: Progressing   Problem: Role Relationship: Goal: Method of communication will improve Outcome: Progressing   Problem: Education: Goal: Knowledge about tracheostomy care/management will improve Outcome: Progressing   Problem: Activity: Goal: Ability to tolerate increased activity will improve Outcome: Progressing   Problem: Health Behavior/Discharge Planning: Goal: Ability to manage tracheostomy will improve Outcome: Progressing   Problem: Respiratory: Goal: Patent airway maintenance will improve Outcome: Progressing   Problem: Role Relationship: Goal: Ability to communicate will improve Outcome: Progressing

## 2020-05-17 NOTE — Evaluation (Signed)
Clinical/Bedside Swallow Evaluation Patient Details  Name: Carlos Olson MRN: [id number] Date of Birth: Nov 12, 1940  Today's Date: 05/17/2020 Time: SLP Start Time (ACUTE ONLY): 1716 SLP Stop Time (ACUTE ONLY): 1732 SLP Time Calculation (min) (ACUTE ONLY): 16 min  Past Medical History:  Past Medical History:  Diagnosis Date  . AAA (abdominal aortic aneurysm) ([place])   . Atherosclerosis of aortic arch ([place]) 09/25/2015   By CT scan   . CAD (coronary artery disease) 09/25/2015   Mild 3v by CT scan   . Cancer [facility])    prostate  . Colon polyps   . COPD (chronic obstructive pulmonary disease) ([place])   . Diverticulosis of colon   . Femoral bruit    bilateral, found by Dr. Jefm Bryant  . Heart murmur    child  . History of prostate cancer   . IBS (irritable bowel syndrome)    improved after retirement  . 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 Pelvic fracture ([place])   . Pneumonia    child  . Pulmonary nodules 09/25/2015   On screening CT scan, rpt 1 yr   . Shortness of breath    doe   Past Surgical History:  Past Surgical History:  Procedure Laterality Date  . ANTERIOR CERVICAL DECOMP/DISCECTOMY FUSION  07/29/2011   Procedure: ANTERIOR CERVICAL DECOMPRESSION/DISCECTOMY FUSION 1 LEVEL;  Surgeon: Jessy Oto, MD;  Location: [place];  Service: Orthopedics;  Laterality: N/A;  Anterior Cervical Discectomy and Fusion C3-4  . AORTA - BILATERAL FEMORAL ARTERY BYPASS GRAFT N/A 04/17/2020   Procedure: AORTA BIFEMORAL BYPASS GRAFT USING A HEMASHIELD GOLD BIFURCATED GRAFT;  Surgeon: Waynetta Sandy, MD;  Location: [place];  Service: Vascular;  Laterality: N/A;  . BUBBLE STUDY  04/26/2020   Procedure: BUBBLE STUDY;  Surgeon: Freada Bergeron, MD;  Location: [place];  Service: Cardiovascular;;  . CARDIOVERSION N/A 04/26/2020   Procedure: CARDIOVERSION;  Surgeon: Freada Bergeron, MD;  Location: [facility] ENDOSCOPY;  Service: Cardiovascular;   Laterality: N/A;  . CATARACT EXTRACTION W/PHACO Right 07/10/2015   Procedure: CATARACT EXTRACTION PHACO AND INTRAOCULAR LENS PLACEMENT ([place]);  Surgeon: Birder Robson, MD;  Location: [facility] ORS;  Service: Ophthalmology;  Laterality: Right;  [place] 00:58   . CATARACT EXTRACTION W/PHACO Left 07/24/2015   Procedure: CATARACT EXTRACTION PHACO AND INTRAOCULAR LENS PLACEMENT ([facility]);  Surgeon: Birder Robson, MD;  Location: [facility] ORS;  Service: Ophthalmology;  Laterality: Left;  [place] 00:38   . COLONOSCOPY  12/2007   [place], diverticulosis and polyp, rpt 5 yrs  . dental implants    . EMBOLIZATION Right 03/26/2020   Procedure: EMBOLIZATION;  Surgeon: Waynetta Sandy, MD;  Location: [place] [facility];  Service: Cardiovascular;  Laterality: Right;  . FORAMINOTOMY 2 LEVEL Left 10/2013   C4/5, C5/6 ([place])  . HERNIA REPAIR     right 05/1999, left 09/04/05  . JOINT REPLACEMENT Right    TKR  . KNEE ARTHROPLASTY Left 05/20/2017   Procedure: COMPUTER ASSISTED TOTAL KNEE ARTHROPLASTY;  Surgeon: Dereck Leep, MD;  Location: [facility] ORS;  Service: Orthopedics;  Laterality: Left;  . KNEE ARTHROSCOPY  09/1997   left  . POSTERIOR CERVICAL FUSION/FORAMINOTOMY N/A 11/22/2013   Procedure: LEFT C4-5 AND C5-6 FORAMINOTOMY;  Surgeon: Jessy Oto, MD;  Location: [place];  Service: Orthopedics;  Laterality: N/A;  . PROSTATECTOMY  08/1990  . 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 TEE WITHOUT CARDIOVERSION N/A 04/26/2020   Procedure: TRANSESOPHAGEAL ECHOCARDIOGRAM (TEE);  Surgeon: Freada Bergeron, MD;  Location: [facility] ENDOSCOPY;  Service: Cardiovascular;  Laterality: N/A;  . TONSILLECTOMY     HPI:  Pt is an 79 year old male who presented 10/18 for elective endovascular repair abdominal aortic aneurysm. Hospital course complicated by AKI, metabolic encephalopathy, acute hypercarbic respiratory failure, hypoxia/ RLL pneumonia, and hypotension. Treated in ICU for sepsis, AFRVR, underwent cardioversion, ABX course. SLP  ordered 10/28 given pt coughing with PO liquid intake. MBS 10/29: mild oropharyngeal dysphagia characterized by reduced bolus propulsion, reduced lingual retraction, reduced pharyngeal constriction, and reduced anterior laryngeal movement. Recommended dys 3 diet with thin liquid. Pt became obtunded on 11/2 and was transferred back to ICU. Pt made NPO and Cortrak placed on 11/3, SLP recommended pt remain NPO with water protocol. Pt reintubated 11/5-11/8. CXR 11/9 consistent with previous small bilateral pleural effusions and right lung base aeration. Pt had repeat bedside swallow evaluation on 11/9 and was asymptomatic for aspiration but demonstrated a baseline congested cough and increased work of breathing. Pt was re-intubated shortly after swallow evaluation and trach was placed on 11/10. CXR 11/16: Diffuse bilateral pulmonary infiltrates/edema with slight improvement.   Assessment / Plan / Recommendation Clinical Impression  Pt was seen for bedside swallow re-evaluation with his daughter present. Oral mechanism exam was [facility] and pt was edentulous. Pt does own dentures but they were not in place during the evaluation. PMSV was in place throughout the evaluation and vitals remained stable. Pt exhibited throat clearing and coughing with ice chips and thin liquids via tsp and cup, suggesting possible aspiration. Pt refused additional trials stating, "Can we just stop so I can rest?" It is recommended that the pt's NPO status be maintained with continued temporary non-oral alimentation. SLP will follow for dysphagia treatment and likely repeat instrumental assessment.  SLP Visit Diagnosis: Dysphagia, unspecified (R13.10)    Aspiration Risk       Diet Recommendation NPO;Alternative means - temporary   Medication Administration: Via alternative means    Other  Recommendations Oral Care Recommendations: Oral care QID;Staff/trained caregiver to provide oral care   Follow up Recommendations LTACH       Frequency and Duration min 2x/week  2 weeks       Prognosis Prognosis for Safe Diet Advancement: Good      Swallow Study   General Date of Onset: 04/25/20 HPI: Pt is an 79 year old male who presented 10/18 for elective endovascular repair abdominal aortic aneurysm. Hospital course complicated by AKI, metabolic encephalopathy, acute hypercarbic respiratory failure, hypoxia/ RLL pneumonia, and hypotension. Treated in ICU for sepsis, AFRVR, underwent cardioversion, ABX course. SLP ordered 10/28 given pt coughing with PO liquid intake. MBS 10/29: mild oropharyngeal dysphagia characterized by reduced bolus propulsion, reduced lingual retraction, reduced pharyngeal constriction, and reduced anterior laryngeal movement. Recommended dys 3 diet with thin liquid. Pt became obtunded on 11/2 and was transferred back to ICU. Pt made NPO and Cortrak placed on 11/3, SLP recommended pt remain NPO with water protocol. Pt reintubated 11/5-11/8. CXR 11/9 consistent with previous small bilateral pleural effusions and right lung base aeration. Pt had repeat bedside swallow evaluation on 11/9 and was asymptomatic for aspiration but demonstrated a baseline congested cough and increased work of breathing. Pt was re-intubated shortly after swallow evaluation and trach was placed on 11/10. CXR 11/16: Diffuse bilateral pulmonary infiltrates/edema with slight improvement. Type of Study: Bedside Swallow Evaluation Previous Swallow Assessment: See HPI Diet Prior to this Study: NPO;NG Tube Temperature Spikes Noted: No Respiratory Status: Trach Collar History of Recent  Intubation: Yes Length of Intubations (days):  (See HPI) Behavior/Cognition: Alert;Cooperative;Pleasant mood Oral Cavity Assessment: Within Functional Limits Oral Care Completed by SLP: No Oral Cavity - Dentition: Edentulous Vision: Functional for self-feeding Self-Feeding Abilities: Needs assist Patient Positioning: Upright in bed;Postural control  adequate for testing Baseline Vocal Quality: Low vocal intensity Volitional Cough: Weak Volitional Swallow: Unable to elicit    Oral/Motor/Sensory Function Overall Oral Motor/Sensory Function: Within functional limits   Ice Chips Ice chips: Impaired Presentation: Spoon Pharyngeal Phase Impairments: Throat Clearing - Immediate   Thin Liquid Thin Liquid: Impaired Presentation: Spoon;Cup Pharyngeal  Phase Impairments: Throat Clearing - Immediate;Cough - Delayed    Nectar Thick Nectar Thick Liquid: Not tested   Honey Thick Honey Thick Liquid: Not tested   Puree Puree: Not tested   Solid     Solid: Not tested     Carlos Olson, [place], [place] Office number [phone] Pager [place] 05/17/2020,6:04 PM

## 2020-05-17 NOTE — Progress Notes (Signed)
NAME:  OCIEL RETHERFORD, MRN:  [id number], DOB:  1941/02/10, LOS: 85 ADMISSION DATE:  04/17/2020, CONSULTATION DATE: 10/21 REFERRING MD: Dr. Donzetta Matters, CHIEF COMPLAINT: AKI with mild hypoxia  Brief History   79 year old male who presented 10/18 for elective endovascular repair abdominal aortic aneurysm with Dr. Donzetta Matters. PCCM consulted 10/21 for assistance in medical management.  Hospital course complicated by AKI, metabolic encephalopathy, hypoxia/ RLL pneumonia, and hypotension. Treated in ICU for sepsis, AFRVR, underwent cardioversion, ABX course. Moved out of ICU. Ongoing course significant for AKI, AF, debility. Then 11/2 he became encephalopathic overnight and was transferred back to ICU. PCCM re-consulted.  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  Past Medical History  AAA History of prostate cancer Heart murmur Diverticulitis COPD CAD atherosclerosis  Significant Hospital Events   Admitted 10/18 for elective AAA repair 10/19 OR  11/15 fever while on cefazolin, changed to ceftriaxone  Consults:  Cardiology PCCM Nephrology GI  Procedures:  Endovascular AAA repair 10/19 Tracheostomy tube placement 11/10  Significant Diagnostic Tests:  04/21/2020 CT A/P > Status post abdominal aortic repair with aortobifemoral bypass graft. Some stranding is noted in the operative site likely related to postoperative change. No sizable hematoma is seen. Diverticulosis without diverticulitis. Near complete consolidation of the right lower lobe. Small left pleural effusion is seen with left basilar atelectasis. 10/25 CTH > 1. Stable.  No acute intracranial abnormality. 2. Atrophy with chronic small vessel white matter ischemic disease.  Micro Data:  Blood 10/28 > negative Sputum 10/28 > few candida Tracheal aspirate 11/12-MSSA 11/15 urine-E.Coli>>  Antimicrobials:   10/19 cefazolin  10/22 zosyn > 10/26 Vancomycin 10/29 > 11/2  Zosyn 10/29 > 11/3 Fluconazole 10/29 >>> Zyvox 11/12 11/15 ceftriaxone >>  Interim history/subjective:  Remains afebrile Was put on trach mask at 8 am yesterday and tolerated.   [place]ock on vent at 3am overnight. Hemodynamically stable,  Continues to be tachycardic    Objective   Blood pressure 115/66, pulse 92, temperature 98.6 F (37 C), temperature source Axillary, resp. rate (!) 26, height 6\' 3"  (1.905 m), weight 95.5 kg, SpO2 99 %.    Vent Mode: PRVC FiO2 (%):  [28 %-40 %] 40 % Set Rate:  [26 bmp] 26 bmp Vt Set:  [630 mL] 630 mL PEEP:  [5 cmH20] 5 cmH20 Pressure Support:  [5 cmH20] 5 cmH20 Plateau Pressure:  [10 cmH20] 10 cmH20   Intake/Output Summary (Last 24 hours) at 05/17/2020 0654 Last data filed at 05/17/2020 0500 Gross per 24 hour  Intake 1460 ml  Output 2970 ml  Net -1510 ml   Filed Weights   05/15/20 0400 05/16/20 0500 05/17/20 0500  Weight: 102.8 kg 96.2 kg 95.5 kg   Examination: General:79yr old make in no apparent distress  HEENT: EOMI, sclera anicteric, mild pallor Neck: nontender, no JVD, no thyromegally Cardiovascular: RRR with no murmurs noted Respiratory: Bilateral ventilated breath sounds, diminished at bases Gastrointestinal: Soft,non tender, non distended. Bowel sounds present. No abdominal pain GU: Feculent urine Ext: trace peripheral edema bilaterally, well perfused  Derm: warm and dry Neuro: Reponds to voice and follows commands, weaker in left arm   Labs significant for mild Hypernatremia 145,  Hypoalbumin 1.8. Hemoglobin improving.    Resolved Hospital Problem list   Hypotension due to hypovolemia  RLL pneumonia Assessment & Plan:   Acute hypercarbic respiratory failure Status post tracheostomy 11/10  COPD -Continue spontaneous breathing/trach collar trials,  was tolerating 4 hours daily , currently weaning limited by tachycardia, tolerating pressure support 12/5  today -Continue bronchodilators  Acute metabolic and toxic encephalopathy -Avoid narcotics -As needed Precedex -Tylenol for fever/pain -Continue dose of Xanax 2.25 twice daily, would consider reducing dose as allowed  Acute kidney injury -Avoid nephrotoxins  MSSA pneumonia Colovesical fistula -etiology unclear, previous history of radiation for prostate cancer and diverticulosis. GI ,urology consultation obtained Further imaging/flex sig deferred for now -On ceftriaxone, will likely need long-term antibiotics, sensitive to Keflex, would switch to oral antibiotics  Atrial fibrillation -On amiodarone -On Lopressor -Apixaban  Anemia -follow, transfuse only if hemoglobin less than 7  History of pulmonary nodule noted 02/2020 -To follow as outpatient  Recent repair of abdominal aortic aneurysm -Followed by vascular surgery  Malnutrition -Continue tube feeds  Summary -weaning today impeded by tachycardia, wonder if this is being driven by persistent infection from colovesical fistula  Best practice:  Diet: Continue enteral nutrition Pain/Anxiety/Delirium protocol (if indicated): Precedex to RASS -1. VAP protocol (if indicated): In place DVT prophylaxis: Eliquis GI prophylaxis: PPI Glucose control: euglycemic on no coverage Mobility: PT as tolerated Code Status: Full Family Communication: Updated daughter at bedside   Carollee Leitz, MD Family Medicine Residency   05/17/2020

## 2020-05-17 NOTE — Progress Notes (Signed)
Nutrition Follow-up  DOCUMENTATION CODES:   Severe malnutrition in context of acute illness/injury  INTERVENTION:   Tube Feeding via Cortrak:  ContinueVital 1.5 at 60 ml/hr ContinuePro-Source 45 mLto TID Provides 130g of protein, 2280 kcals, 1094 mL  Meets 100% of estimated protein and calorie needs   NUTRITION DIAGNOSIS:   Severe Malnutrition related to acute illness (AAA repair surgery with minimal intake x 10 days since admission) as evidenced by moderate muscle depletion, energy intake < or equal to 50% for > or equal to 5 days.  Being addressed via TF   GOAL:   Patient will meet greater than or equal to 90% of their needs  Progressing  MONITOR:   PO intake, Supplement acceptance  REASON FOR ASSESSMENT:   Rounds    ASSESSMENT:   79 yo male admitted 10/18 for elective endovascular repair of AAA. PMH includes AAA, prostate Ca, heart murmur, diverticulitis, COPD, CAD, atherosclerosis, IBS.  11/02Obtunded, hypercarbic, transferred to ICU, NG tube inserted, TF started  11/03 Cortrak placed 11/05 Re-Intubated 11/10 Trach placed, possible colovesical fistula, stool in urine  Trach collar during the day, vent support at night  Tolerating Vital 1.5 at 60 ml/hr via Cortrak tube  Pt continues with significant documented stool output; likely combination to stool and urine given colovesical fistula. If concerned about stool amount, consider addition of anti-diarrheal. Pt ha been on questran since 11/11  Free water 200 mL q 4 hours, noted mild hypernatremia  Labs: sodium 146 (H), Creatinine wdl Meds: questran  Diet Order:   Diet Order            Diet NPO time specified  Diet effective midnight                 EDUCATION NEEDS:   No education needs have been identified at this time  Skin:  Skin Assessment: Reviewed RN Assessment (abd & R groin surgical incisions)  Last BM:  11/18 rectal tube; colovesical fistula  Height:   Ht Readings from Last 1  Encounters:  05/07/20 6\' 3"  (1.905 m)    Weight:   Wt Readings from Last 1 Encounters:  05/17/20 95.5 kg    Ideal Body Weight:  89.1 kg  BMI:  Body mass index is 26.32 kg/m.  Estimated Nutritional Needs:   Kcal:  2200-2400 kcals  Protein:  120-145 g  Fluid:  >/= 2.2 L   Kerman Passey MS, RDN, LDN, CNSC Registered Dietitian III Clinical Nutrition RD Pager and On-Call Pager Number Located in [place]

## 2020-05-17 NOTE — Progress Notes (Signed)
Physical Therapy Treatment Patient Details Name: Carlos Olson MRN: [id number] DOB: December 18, 1940 Today's Date: 05/17/2020    History of Present Illness 79 year old male who presented 10/18 for elective endovascular repair abdominal aortic aneurysm. Hospital course complicated by AKI, metabolic encephalopathy, acute hypercarbic respiratory failure, hypoxia/ RLL pneumonia, and hypotension. Treated in ICU for sepsis, AFRVR, underwent cardioversion, ABX course. SLP ordered 10/28 given pt coughing with PO liquid intake. MBS 10/29: mild oropharyngeal dysphagia characterized by reduced bolus propulsion, reduced lingual retraction, reduced pharyngeal constriction, and reduced anterior laryngeal movement. Recommended dys 3 diet with thin liquid. Pt became obtunded on 11/2 and was transferred back to ICU. Pt made NPO and Cortrak placed on 11/3, SLP recommended pt remain NPO with water protocol. Pt reintubated 11/5-11/8. CXR 11/9 consistent with previous small bilateral pleural effusions and right lung base aeration.     PT Comments    Pt admitted with above diagnosis. Pt was able to sit EOB 8 min with min to mod assist fatiguing quickly as well as incr O2 needs from 28% trach collar to 60% trach collar to maintain sats >86%.  Pt fatigues rapidly. Pt adamantly refused for PT to scoot him into chair therefore compromised by placing bed in full chair position at end of treatment.   Daughter asked pt is he wanted to get better and pt shook head no.  Pt appears very depressed.  Pt currently with functional limitations due to balance and endurance deficits. Pt will benefit from skilled PT to increase their independence and safety with mobility to allow discharge to the venue listed below.     Follow Up Recommendations  LTACH;Supervision/Assistance - 24 hour     Equipment Recommendations  3in1 (PT)    Recommendations for Other Services       Precautions / Restrictions Precautions Precautions:  Fall Precaution Comments: trach with vent,  flexi seal, Primo fit Restrictions Weight Bearing Restrictions: No    Mobility  Bed Mobility Overal bed mobility: Needs Assistance Bed Mobility: Supine to Sit;Sit to Supine Rolling: Mod assist Sidelying to sit: +2 for physical assistance;Mod assist;Max assist Supine to sit: Mod assist;+2 for physical assistance;Max assist Sit to supine: Max assist;Total assist;+2 for physical assistance   General bed mobility comments: Pt needed mod to max assist for bed mobility with incr time to complete tasks. Needed assist with pad to scoot hips to EOB. Needed assist for elevation of trunk and for LEs to EOB. Total asist to lie down due to pt fatigued and lethargic after sitting for EOB 8 min.  Had chair ready to scoot pt to the chair however pt refused to perform adamantly even with daughter's encouragement.  Told pt PT would place in full chiar position in bed at end of treatment.   Transfers                    Ambulation/Gait                 Stairs             Wheelchair Mobility    Modified Rankin (Stroke Patients Only)       Balance Overall balance assessment: Needs assistance Sitting-balance support: Feet supported;Bilateral upper extremity supported Sitting balance-Leahy Scale: Poor Sitting balance - Comments: Pt was able to sit EOB needing mod to max assist  and progressing to min asssist for up to 30 seconds and then needed min to mod assist for sitting.  Pt fatigued and asked to lie down after  8 min and was closing eyes due to fatigue.  Did do a few LE exercses.                                    Cognition Arousal/Alertness: Awake/alert Behavior During Therapy: Flat affect Overall Cognitive Status: Difficult to assess Area of Impairment: Following commands;Memory;Attention;Problem solving                   Current Attention Level: Sustained Memory: Decreased short-term memory Following  Commands: Follows one step commands with increased time     Problem Solving: Slow processing;Decreased initiation;Difficulty sequencing;Requires verbal cues General Comments: Pt talking less today.      Exercises General Exercises - Upper Extremity Shoulder Flexion: AAROM;Both;10 reps;Supine Shoulder Extension: AAROM;Both;10 reps;Supine Elbow Flexion: AAROM;Both;10 reps;Supine Elbow Extension: AAROM;Both;10 reps;Supine General Exercises - Lower Extremity Ankle Circles/Pumps: Both;10 reps;Seated;AAROM Quad Sets: AROM;Both;5 reps;Supine Long Arc Quad: AROM;Both;10 reps;Seated Heel Slides: AAROM;Both;10 reps;Supine    General Comments General comments (skin integrity, edema, etc.): Pt was on 28% on arrival with VSS but incr to 60% FiO2 while at EOB for incr support with sats at times 86% toward end of sitting EOB.        Pertinent Vitals/Pain Pain Assessment: Faces Faces Pain Scale: Hurts whole lot Pain Location: generalized, incr pain left UE Pain Descriptors / Indicators: Aching;Grimacing;Guarding Pain Intervention(s): Limited activity within patient's tolerance;Monitored during session;Repositioned    Home Living                      Prior Function            PT Goals (current goals can now be found in the care plan section) Acute Rehab PT Goals Patient Stated Goal: to go home Progress towards PT goals: Not progressing toward goals - comment (self limiting)    Frequency    Min 2X/week      PT Plan Current plan remains appropriate    Co-evaluation              AM-PAC PT "6 Clicks" Mobility   Outcome Measure  Help needed turning from your back to your side while in a flat bed without using bedrails?: A Lot Help needed moving from lying on your back to sitting on the side of a flat bed without using bedrails?: A Lot Help needed moving to and from a bed to a chair (including a wheelchair)?: Total Help needed standing up from a chair using your  arms (e.g., wheelchair or bedside chair)?: Total Help needed to walk in hospital room?: Total Help needed climbing 3-5 steps with a railing? : Total 6 Click Score: 8    End of Session Equipment Utilized During Treatment: Gait belt;Oxygen (trach collar) Activity Tolerance: Patient limited by fatigue Patient left: with call bell/phone within reach;in bed;with bed alarm set;with family/visitor present (bed in full chair position) Nurse Communication: Mobility status;Need for lift equipment PT Visit Diagnosis: Muscle weakness (generalized) (M62.81);Pain Pain - part of body:  (back and groin)     Time: 6283-1517 PT Time Calculation (min) (ACUTE ONLY): 32 min  Charges:  $Therapeutic Exercise: 8-22 mins $Therapeutic Activity: 8-22 mins                     Valarie Farace W,PT Acute Rehabilitation Services Pager:  [phone]  Office:  [place] 05/17/2020, 11:45 AM

## 2020-05-17 NOTE — Progress Notes (Signed)
Placed patient back on ventilator with full support for night time rest.  Patient has done well all day weaning with ATC.  I feel he is tiring out and his work of breathing has increased.  We will rest him for a few hours and reassess him in the am for further ATC weaning.  RN aware of plan.

## 2020-05-18 DIAGNOSIS — Z930 Tracheostomy status: Secondary | ICD-10-CM | POA: Diagnosis not present

## 2020-05-18 DIAGNOSIS — J9601 Acute respiratory failure with hypoxia: Secondary | ICD-10-CM | POA: Diagnosis not present

## 2020-05-18 DIAGNOSIS — N179 Acute kidney failure, unspecified: Secondary | ICD-10-CM | POA: Diagnosis not present

## 2020-05-18 LAB — RENAL FUNCTION PANEL
Albumin: 1.8 g/dL — ABNORMAL LOW (ref 3.5–5.0)
Anion gap: 9 (ref 5–15)
BUN: 39 mg/dL — ABNORMAL HIGH (ref 8–23)
CO2: 23 mmol/L (ref 22–32)
Calcium: 8.6 mg/dL — ABNORMAL LOW (ref 8.9–10.3)
Chloride: 116 mmol/L — ABNORMAL HIGH (ref 98–111)
Creatinine, Ser: 1.09 mg/dL (ref 0.61–1.24)
GFR, Estimated: >60 mL/min (ref >60)
Glucose, Bld: 118 mg/dL — ABNORMAL HIGH (ref 70–99)
Phosphorus: 3.1 mg/dL (ref 2.5–4.6)
Potassium: 4.3 mmol/L (ref 3.5–5.1)
Sodium: 148 mmol/L — ABNORMAL HIGH (ref 135–145)

## 2020-05-18 LAB — CBC WITH DIFFERENTIAL/PLATELET
Abs Immature Granulocytes: 0.22 10*3/uL — ABNORMAL HIGH (ref 0.00–0.07)
Basophils Absolute: 0.1 10*3/uL (ref 0.0–0.1)
Basophils Relative: 1 %
Eosinophils Absolute: 0.5 10*3/uL (ref 0.0–0.5)
Eosinophils Relative: 6 %
HCT: 27.7 % — ABNORMAL LOW (ref 39.0–52.0)
Hemoglobin: 8.4 g/dL — ABNORMAL LOW (ref 13.0–17.0)
Immature Granulocytes: 3 %
Lymphocytes Relative: 9 %
Lymphs Abs: 0.7 10*3/uL (ref 0.7–4.0)
MCH: 31.2 pg (ref 26.0–34.0)
MCHC: 30.3 g/dL (ref 30.0–36.0)
MCV: 103 fL — ABNORMAL HIGH (ref 80.0–100.0)
Monocytes Absolute: 0.8 10*3/uL (ref 0.1–1.0)
Monocytes Relative: 10 %
Neutro Abs: 5.8 10*3/uL (ref 1.7–7.7)
Neutrophils Relative %: 71 %
Platelets: 311 10*3/uL (ref 150–400)
RBC: 2.69 MIL/uL — ABNORMAL LOW (ref 4.22–5.81)
RDW: 15.9 % — ABNORMAL HIGH (ref 11.5–15.5)
WBC: 8.1 10*3/uL (ref 4.0–10.5)
nRBC: 0 % (ref 0.0–0.2)

## 2020-05-18 LAB — GLUCOSE, CAPILLARY
Glucose-Capillary: 106 mg/dL — ABNORMAL HIGH (ref 70–99)
Glucose-Capillary: 112 mg/dL — ABNORMAL HIGH (ref 70–99)
Glucose-Capillary: 107 mg/dL — ABNORMAL HIGH (ref 70–99)
Glucose-Capillary: 102 mg/dL — ABNORMAL HIGH (ref 70–99)
Glucose-Capillary: 95 mg/dL (ref 70–99)
Glucose-Capillary: 102 mg/dL — ABNORMAL HIGH (ref 70–99)
Glucose-Capillary: 113 mg/dL — ABNORMAL HIGH (ref 70–99)
Glucose-Capillary: 106 mg/dL — ABNORMAL HIGH (ref 70–99)

## 2020-05-18 MED ORDER — FREE WATER
300.0000 mL | Status: DC
  Administered 2020-05-18 – 2020-05-23 (×30): 300 mL

## 2020-05-18 MED ORDER — METOPROLOL TARTRATE 25 MG/10 ML ORAL SUSPENSION
50.0000 mg | Freq: Two times a day (BID) | ORAL | Status: DC
  Administered 2020-05-18 – 2020-05-21 (×7): 50 mg via ORAL
  Filled 2020-05-18 (×8): qty 20

## 2020-05-18 NOTE — Progress Notes (Addendum)
[facility]  Subjective  - Expresses understanding and follows commands.   Objective 114/61 (!) 112 98.1 F (36.7 C) (Axillary) (!) 27 99%  Intake/Output Summary (Last 24 hours) at 05/18/2020 0721 Last data filed at 05/18/2020 0600 Gross per 24 hour  Intake 1630 ml  Output 2600 ml  Net -970 ml    Feet warm well perfused with intact motor and sensation Abdomin soft, NTTP, incision healing well Heart A fib Lungs trach in place on support over night  Assessment/Planning: POD # 67  79 y.o. male is s/p  Aortobifemoral bypass grafting  Speech recommends NPO secondary to aspiration precautions.  Heart A fib s/p attempted cardioversion's, supported with amiodarone, Eliquis and lopressor.   Lungs vent support over night for rest  HGB 8.4, Cr stable 1.09 Afebrile WBC 8.1 Tried to motivate to get out of the bed.      Roxy Horseman 05/18/2020 7:21 AM --  Laboratory Lab Results: Recent Labs    05/17/20 0036 05/18/20 0210  WBC 9.0 8.1  HGB 9.1* 8.4*  HCT 29.5* 27.7*  PLT 272 311   BMET Recent Labs    05/17/20 0036 05/18/20 0210  NA 146* 148*  K 4.3 4.3  CL 114* 116*  CO2 21* 23  GLUCOSE 118* 118*  BUN 42* 39*  CREATININE 1.18 1.09  CALCIUM 8.7* 8.6*    COAG Lab Results  Component Value Date   INR 1.3 (H) 05/07/2020   INR 1.5 (H) 04/26/2020   INR 1.2 04/17/2020   No results found for: PTT  I have independently interviewed and examined patient and agree with PA assessment and plan above.  Stable from vascular standpoint.  Continue vent wean with trach collar trials.  I discussed the disposition with his wife which will be CIR if he can wean versus more likely long-term acute care if he remains vent dependent with other ongoing chronic issues.  CCM care of this patient much appreciated.  Aurelio Mccamy C. Donzetta Matters, MD [facility] Office: [phone] Pager: [phone]

## 2020-05-18 NOTE — Progress Notes (Signed)
NAME:  Carlos Olson, MRN:  [id number], DOB:  Oct 26, 1940, LOS: 28 ADMISSION DATE:  04/17/2020, CONSULTATION DATE: 10/21 REFERRING MD: Dr. Donzetta Matters, CHIEF COMPLAINT: AKI with mild hypoxia  Brief History   79 year old male who presented 10/18 for elective endovascular repair abdominal aortic aneurysm with Dr. Donzetta Matters. PCCM consulted 10/21 for assistance in medical management.  Hospital course complicated by AKI, metabolic encephalopathy, hypoxia/ RLL pneumonia, and hypotension. Treated in ICU for sepsis, AFRVR, underwent cardioversion, ABX course. Moved out of ICU. Ongoing course significant for AKI, AF, debility. Then 11/2 he became encephalopathic overnight and was transferred back to ICU. PCCM re-consulted.  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  Past Medical History  AAA History of prostate cancer Heart murmur Diverticulitis COPD CAD atherosclerosis  Significant Hospital Events   Admitted 10/18 for elective AAA repair 10/19 OR  11/15 fever while on cefazolin, changed to ceftriaxone  Consults:  Cardiology PCCM Nephrology GI  Procedures:  Endovascular AAA repair 10/19 Tracheostomy tube placement 11/10  Significant Diagnostic Tests:  04/21/2020 CT A/P > Status post abdominal aortic repair with aortobifemoral bypass graft. Some stranding is noted in the operative site likely related to postoperative change. No sizable hematoma is seen. Diverticulosis without diverticulitis. Near complete consolidation of the right lower lobe. Small left pleural effusion is seen with left basilar atelectasis. 10/25 CTH > 1. Stable.  No acute intracranial abnormality. 2. Atrophy with chronic small vessel white matter ischemic disease.  Micro Data:  Blood 10/28 > negative Sputum 10/28 > few candida Tracheal aspirate 11/12-MSSA 11/15 urine-E.Coli>>  Antimicrobials:    10/19 cefazolin  10/22 zosyn > 10/26 Vancomycin 10/29 > 11/2  Zosyn 10/29 > 11/3 Fluconazole 10/29 >>> Zyvox 11/12 11/15 ceftriaxone >>  Interim history/subjective:  Remains afebrile Was put on trach mask at 8 am yesterday and tolerated.   Back on vent at 10pm overnight. More well rested today Denies any pain. Hemodynamically stable,  Continues to be tachycardic    Objective   Blood pressure 107/67, pulse (!) 107, temperature 98.1 F (36.7 C), temperature source Axillary, resp. rate (!) 30, height 6\' 3"  (1.905 m), weight 94.6 kg, SpO2 100 %.    Vent Mode: PRVC FiO2 (%):  [28 %-40 %] 40 % Set Rate:  [26 bmp] 26 bmp Vt Set:  [630 mL] 630 mL PEEP:  [5 cmH20] 5 cmH20 Plateau Pressure:  [10 cmH20-16 cmH20] 16 cmH20   Intake/Output Summary (Last 24 hours) at 05/18/2020 0738 Last data filed at 05/18/2020 0600 Gross per 24 hour  Intake 1630 ml  Output 2600 ml  Net -970 ml   Filed Weights   05/16/20 0500 05/17/20 0500 05/18/20 0454  Weight: 96.2 kg 95.5 kg 94.6 kg   Examination: General:79yr old make in no apparent distress  HEENT: EOMI, sclera anicteric, mild pallor Neck: nontender, no JVD, no thyromegally Cardiovascular: RRR with no murmurs noted Respiratory: Bilateral ventilated breath sounds, diminished at bases Gastrointestinal: Soft,non tender, non distended. Bowel sounds present. No abdominal pain GU: Feculent urine Ext: trace peripheral edema bilaterally, well perfused  Derm: warm and dry Neuro: Alert and follows commands, weaker in left arm   Labs significant for mild Hypernatremia 148,  Hypoalbumin 1.8. Slight decrease in Hbg 8.1  Resolved Hospital Problem list   Hypotension due to hypovolemia  RLL pneumonia Assessment & Plan:   Acute hypercarbic respiratory failure Status post tracheostomy 11/10  COPD -Continue spontaneous breathing/trach  collar trials, was tolerated 9 hours yesterday,currently weaning limited by tachycardia -May need a few extra  nights on vent before able to advance to 24 hrs off -Continue bronchodilators  Acute metabolic and toxic encephalopathy Improved -Avoid narcotics -As needed Precedex -Tylenol for fever/pain -Continue dose of Xanax 0.25 twice daily, would consider reducing dose to prn when able    MSSA pneumonia Colovesical fistula -etiology unclear, previous history of radiation for prostate cancer and diverticulosis. GI ,urology consultation obtained Further imaging/flex sig deferred for now -On ceftriaxone, will likely need long-term antibiotics, sensitive to Keflex, would switch to oral antibiotics  Atrial fibrillation -On amiodarone -On Lopressor, switch to 50mg  BID -Apixaban  Anemia -follow, transfuse only if hemoglobin less than 7  History of pulmonary nodule noted 02/2020 -To follow as outpatient  Recent repair of abdominal aortic aneurysm -Followed by vascular surgery  Malnutrition -Continue tube feeds  Summary -weaning today impeded by tachycardia, wonder if this is being driven by persistent infection from colovesical fistula  Best practice:  Diet: Continue enteral nutrition Pain/Anxiety/Delirium protocol (if indicated): Precedex to RASS -1. VAP protocol (if indicated): In place DVT prophylaxis: Eliquis GI prophylaxis: PPI Glucose control: euglycemic on no coverage Mobility: PT as tolerated Code Status: Full Family Communication: Wife at bedside   Carollee Leitz, MD Family Medicine Residency   05/18/2020

## 2020-05-18 NOTE — Progress Notes (Signed)
Physical Therapy Treatment Patient Details Name: Carlos Olson MRN: [id number] DOB: 26-Jun-1941 Today's Date: 05/18/2020    History of Present Illness 79 year old male who presented 10/18 for elective endovascular repair abdominal aortic aneurysm. Hospital course complicated by AKI, metabolic encephalopathy, acute hypercarbic respiratory failure, hypoxia/ RLL pneumonia, and hypotension. Treated in ICU for sepsis, AFRVR, underwent cardioversion, ABX course. SLP ordered 10/28 given pt coughing with PO liquid intake. MBS 10/29: mild oropharyngeal dysphagia characterized by reduced bolus propulsion, reduced lingual retraction, reduced pharyngeal constriction, and reduced anterior laryngeal movement. Recommended dys 3 diet with thin liquid. Pt became obtunded on 11/2 and was transferred back to ICU. Pt made NPO and Cortrak placed on 11/3, SLP recommended pt remain NPO with water protocol. Pt reintubated 11/5-11/8. CXR 11/9 consistent with previous small bilateral pleural effusions and right lung base aeration.     PT Comments    Pt admitted with above diagnosis. Pt was able to stand to [facility] with mod assist of 2 and moved pt to chair. Pt more participatory today.  Pt fatigues quickly.  Overall tolerated well.  Did incr trach collar from 28% to 60% FiO2 with activity for incr support with O2 >90% throughout treatment. Pt currently with functional limitations due to balance and endurance deficits. Pt will benefit from skilled PT to increase their independence and safety with mobility to allow discharge to the venue listed below.     Follow Up Recommendations  LTACH;Supervision/Assistance - 24 hour     Equipment Recommendations  3in1 (PT)    Recommendations for Other Services       Precautions / Restrictions Precautions Precautions: Fall Precaution Comments: trach with vent,  flexi seal, Primo fit Restrictions Weight Bearing Restrictions: No    Mobility  Bed Mobility Overal bed mobility:  Needs Assistance Bed Mobility: Supine to Sit;Sit to Supine Rolling: Mod assist Sidelying to sit: +2 for physical assistance;Mod assist;Max assist Supine to sit: Mod assist;+2 for physical assistance;Max assist     General bed mobility comments: Pt needed mod to max assist for bed mobility with incr time to complete tasks. Needed assist with pad to scoot hips to EOB. Needed assist for elevation of trunk and for LEs to EOB. Sitting EOB for 5 min.  Pt transitioned to [facility] with mod assist of 2.  Pt needed 2 atttmepts to stand upright enough to place pads with wife encouraging pt in front of pt for best stand. Moved pt in [place] to the relciner and pt stood once more for about 15 seconds to get into recliner. Did incr Trach collar from 28% to 60% during transfer to assist with oxygen and then back to 28% at end of treatment with sats >90%.   Transfers                    Ambulation/Gait                 Stairs             Wheelchair Mobility    Modified Rankin (Stroke Patients Only)       Balance Overall balance assessment: Needs assistance Sitting-balance support: Feet supported;Bilateral upper extremity supported Sitting balance-Leahy Scale: Poor Sitting balance - Comments: Pt was able to sit EOB needing mod to max assist  and progressing to min asssist for up to 30 seconds.     Standing balance support: Bilateral upper extremity supported Standing balance-Leahy Scale: Poor Standing balance comment: reliant on hands on STedy as well as need  for external support                            Cognition Arousal/Alertness: Awake/alert Behavior During Therapy: Flat affect Overall Cognitive Status: Difficult to assess Area of Impairment: Following commands;Memory;Attention;Problem solving                   Current Attention Level: Sustained Memory: Decreased short-term memory Following Commands: Follows one step commands with increased time      Problem Solving: Slow processing;Decreased initiation;Difficulty sequencing;Requires verbal cues        Exercises General Exercises - Lower Extremity Heel Slides: AAROM;Both;10 reps;Supine    General Comments        Pertinent Vitals/Pain Pain Assessment: Faces Faces Pain Scale: Hurts whole lot Pain Location: generalized, incr pain left UE, back Pain Descriptors / Indicators: Aching;Grimacing;Guarding Pain Intervention(s): Limited activity within patient's tolerance;Monitored during session;Repositioned    Home Living                      Prior Function            PT Goals (current goals can now be found in the care plan section) Acute Rehab PT Goals Patient Stated Goal: to go home Progress towards PT goals: Progressing toward goals    Frequency    Min 2X/week      PT Plan Current plan remains appropriate    Co-evaluation              AM-PAC PT "6 Clicks" Mobility   Outcome Measure  Help needed turning from your back to your side while in a flat bed without using bedrails?: A Lot Help needed moving from lying on your back to sitting on the side of a flat bed without using bedrails?: A Lot Help needed moving to and from a bed to a chair (including a wheelchair)?: A Lot Help needed standing up from a chair using your arms (e.g., wheelchair or bedside chair)?: A Lot Help needed to walk in hospital room?: Total Help needed climbing 3-5 steps with a railing? : Total 6 Click Score: 10    End of Session Equipment Utilized During Treatment: Gait belt;Oxygen (trach collar) Activity Tolerance: Patient limited by fatigue Patient left: with call bell/phone within reach;with family/visitor present;in chair Nurse Communication: Mobility status;Need for lift equipment PT Visit Diagnosis: Muscle weakness (generalized) (M62.81);Pain Pain - part of body:  (back and groin)     Time: 7517-0017 PT Time Calculation (min) (ACUTE ONLY): 25 min  Charges:   $Therapeutic Exercise: 8-22 mins $Therapeutic Activity: 8-22 mins                     Shalik Sanfilippo W,PT Acute Rehabilitation Services Pager:  [phone]  Office:  [place] 05/18/2020, 11:46 AM

## 2020-05-18 NOTE — Progress Notes (Signed)
  Speech Language Pathology Treatment: Nada Boozer Speaking valve  Patient Details Name: Carlos Olson MRN: [id number] DOB: Aug 11, 1940 Today's Date: 05/18/2020 Time: 2549-8264 SLP Time Calculation (min) (ACUTE ONLY): 21 min  Assessment / Plan / Recommendation Clinical Impression  Pt was seen for treatment with his wife present. [name] was educated regarding his progress and performance during yesterday's session. Pt's level of alertness was sub-optimal and he required cueing to maintain alertness. Pt's vitals were RR 25, SpO2 96 and HR 118 following cuff deflation and RR 30, SpO2 98, HR 119 immediately following PMSV placement. He tolerated PMSV for ~2 minutes but RR increased to 40-45 with notably increased WOB and it was ultimately removed. Pt's cuff was re-inflated and RN came and assessed per SLP's request. Pt was able to demonstrate phonation during PMSV placement and produced single words with reduced vocal intensity. Pt was more lethargic during this session than on 11/18 and the impact of this on his overall performance is questioned. It is recommended that PMSV be used only with SLP at this time and SLP will continue to follow pt.    HPI HPI: Pt is an 79 year old male who presented 10/18 for elective endovascular repair abdominal aortic aneurysm. Hospital course complicated by AKI, metabolic encephalopathy, acute hypercarbic respiratory failure, hypoxia/ RLL pneumonia, and hypotension. Treated in ICU for sepsis, AFRVR, underwent cardioversion, ABX course. SLP ordered 10/28 given pt coughing with PO liquid intake. MBS 10/29: mild oropharyngeal dysphagia characterized by reduced bolus propulsion, reduced lingual retraction, reduced pharyngeal constriction, and reduced anterior laryngeal movement. Recommended dys 3 diet with thin liquid. Pt became obtunded on 11/2 and was transferred back to ICU. Pt made NPO and Cortrak placed on 11/3, SLP recommended pt remain NPO with water protocol. Pt  reintubated 11/5-11/8. CXR 11/9 consistent with previous small bilateral pleural effusions and right lung base aeration. Pt had repeat bedside swallow evaluation on 11/9 and was asymptomatic for aspiration but demonstrated a baseline congested cough and increased work of breathing. Pt was re-intubated shortly after swallow evaluation and trach was placed on 11/10. CXR 11/16: Diffuse bilateral pulmonary infiltrates/edema with slight improvement.      SLP Plan  Continue with current plan of care       Recommendations         Patient may use Passy-Muir Speech Valve: with SLP only PMSV Supervision: Full MD: Please consider changing trach tube to : Smaller size;Cuffless (when appropriate)         Oral Care Recommendations: Oral care QID Follow up Recommendations: LTACH SLP Visit Diagnosis: Aphonia (R49.1) Plan: Continue with current plan of care       Carlos Olson, [place], [place] Office number [phone] Pager [place] 05/18/2020, 10:25 AM

## 2020-05-19 DIAGNOSIS — J9601 Acute respiratory failure with hypoxia: Secondary | ICD-10-CM | POA: Diagnosis not present

## 2020-05-19 LAB — CBC WITH DIFFERENTIAL/PLATELET
Abs Immature Granulocytes: 0.36 10*3/uL — ABNORMAL HIGH (ref 0.00–0.07)
Basophils Absolute: 0.1 10*3/uL (ref 0.0–0.1)
Basophils Relative: 1 %
Eosinophils Absolute: 0.5 10*3/uL (ref 0.0–0.5)
Eosinophils Relative: 5 %
HCT: 29.2 % — ABNORMAL LOW (ref 39.0–52.0)
Hemoglobin: 8.7 g/dL — ABNORMAL LOW (ref 13.0–17.0)
Immature Granulocytes: 4 %
Lymphocytes Relative: 8 %
Lymphs Abs: 0.7 10*3/uL (ref 0.7–4.0)
MCH: 31 pg (ref 26.0–34.0)
MCHC: 29.8 g/dL — ABNORMAL LOW (ref 30.0–36.0)
MCV: 103.9 fL — ABNORMAL HIGH (ref 80.0–100.0)
Monocytes Absolute: 0.9 10*3/uL (ref 0.1–1.0)
Monocytes Relative: 9 %
Neutro Abs: 6.8 10*3/uL (ref 1.7–7.7)
Neutrophils Relative %: 73 %
Platelets: 361 10*3/uL (ref 150–400)
RBC: 2.81 MIL/uL — ABNORMAL LOW (ref 4.22–5.81)
RDW: 15.9 % — ABNORMAL HIGH (ref 11.5–15.5)
WBC: 9.3 10*3/uL (ref 4.0–10.5)
nRBC: 0 % (ref 0.0–0.2)

## 2020-05-19 LAB — RENAL FUNCTION PANEL
Albumin: 1.8 g/dL — ABNORMAL LOW (ref 3.5–5.0)
Anion gap: 8 (ref 5–15)
BUN: 35 mg/dL — ABNORMAL HIGH (ref 8–23)
CO2: 25 mmol/L (ref 22–32)
Calcium: 8.7 mg/dL — ABNORMAL LOW (ref 8.9–10.3)
Chloride: 114 mmol/L — ABNORMAL HIGH (ref 98–111)
Creatinine, Ser: 1.04 mg/dL (ref 0.61–1.24)
GFR, Estimated: >60 mL/min (ref >60)
Glucose, Bld: 107 mg/dL — ABNORMAL HIGH (ref 70–99)
Phosphorus: 3.1 mg/dL (ref 2.5–4.6)
Potassium: 4 mmol/L (ref 3.5–5.1)
Sodium: 147 mmol/L — ABNORMAL HIGH (ref 135–145)

## 2020-05-19 LAB — GLUCOSE, CAPILLARY
Glucose-Capillary: 96 mg/dL (ref 70–99)
Glucose-Capillary: 120 mg/dL — ABNORMAL HIGH (ref 70–99)
Glucose-Capillary: 97 mg/dL (ref 70–99)
Glucose-Capillary: 93 mg/dL (ref 70–99)
Glucose-Capillary: 97 mg/dL (ref 70–99)

## 2020-05-19 NOTE — Progress Notes (Signed)
NAME:  Carlos Olson, MRN:  [id number], DOB:  28-Mar-1941, LOS: 26 ADMISSION DATE:  04/17/2020, CONSULTATION DATE: 10/21 REFERRING MD: Dr. Donzetta Matters, CHIEF COMPLAINT: AKI with mild hypoxia  Brief History   79 year old male who presented 10/18 for elective endovascular repair abdominal aortic aneurysm with Dr. Donzetta Matters. PCCM consulted 10/21 for assistance in medical management.  Hospital course complicated by AKI, metabolic encephalopathy, hypoxia/ RLL pneumonia, and hypotension. Treated in ICU for sepsis, AFRVR, underwent cardioversion, ABX course. Moved out of ICU. Ongoing course significant for AKI, AF, debility. Then 11/2 he became encephalopathic overnight and was transferred back to ICU. PCCM re-consulted.  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  Past Medical History  AAA History of prostate cancer Heart murmur Diverticulitis COPD CAD atherosclerosis  Significant Hospital Events   Admitted 10/18 for elective AAA repair 10/19 OR  11/15 fever while on cefazolin, changed to ceftriaxone  Consults:  Cardiology PCCM Nephrology GI  Procedures:  Endovascular AAA repair 10/19 Tracheostomy tube placement 11/10  Significant Diagnostic Tests:  04/21/2020 CT A/P > Status post abdominal aortic repair with aortobifemoral bypass graft. Some stranding is noted in the operative site likely related to postoperative change. No sizable hematoma is seen. Diverticulosis without diverticulitis. Near complete consolidation of the right lower lobe. Small left pleural effusion is seen with left basilar atelectasis. 10/25 CTH > 1. Stable.  No acute intracranial abnormality. 2. Atrophy with chronic small vessel white matter ischemic disease.  Micro Data:  Blood 10/28 > negative Sputum 10/28 > few candida Tracheal aspirate 11/12-MSSA 11/15 urine-E.Coli>>  pansensitive  Antimicrobials:  10/19 cefazolin  10/22 zosyn > 10/26 Vancomycin 10/29 > 11/2  Zosyn 10/29 > 11/3 Fluconazole 10/29 >>> Zyvox 11/12 11/15 ceftriaxone >>  Interim history/subjective:   He was on ATC all day yesterday and until 4 AM this morning, then back to MV Currently on MV and comfortable   Objective   Blood pressure 124/60, pulse 83, temperature 98.7 F (37.1 C), temperature source Axillary, resp. rate (!) 24, height 6\' 3"  (1.905 m), weight 95.1 kg, SpO2 100 %.    Vent Mode: PRVC FiO2 (%):  [28 %-40 %] 40 % Set Rate:  [26 bmp] 26 bmp Vt Set:  [630 mL] 630 mL PEEP:  [5 cmH20] 5 cmH20 Plateau Pressure:  [16 cmH20-17 cmH20] 17 cmH20   Intake/Output Summary (Last 24 hours) at 05/19/2020 8416 Last data filed at 05/19/2020 0700 Gross per 24 hour  Intake 1682.88 ml  Output 3400 ml  Net -1717.12 ml   Filed Weights   05/17/20 0500 05/18/20 0454 05/19/20 0200  Weight: 95.5 kg 94.6 kg 95.1 kg   Examination: General: Elderly gentleman, laying in bed comfortably on MV  HEENT: Trach, NG tube in place, oropharynx moist Neck: Trach clean and dry Cardiovascular: Regular, distant, no murmur Respiratory: Decreased bibasilar, no crackles, no wheezes Gastrointestinal: Soft, nontender, nondistended, positive bowel sounds GU: Feculent urine Ext: Trace pretibial edema  Derm: No rash Neuro: Awake, alert, globally weak but follows commands and moves extremities   Resolved Hospital Problem list   Hypotension due to hypovolemia  RLL pneumonia Assessment & Plan:   Acute hypercarbic respiratory failure, now chronic failure in the setting of critical illness Status post tracheostomy 11/10 COPD without acute exacerbation -Continue ATC as he can tolerate.  Did about 18 hours 11/19-11/20.  We will try to continue for this duration, slowly extend the time off  MV daily -Bronchodilators as ordered -Intermittent chest x-ray  Acute metabolic and toxic  encephalopathy Improved -Tolerates narcotics very poorly, avoid -Off Precedex -Low-dose Xanax twice daily, decrease when we believe he can tolerate -Tylenol for fever, pain  MSSA pneumonia Colovesical fistula -etiology unclear, previous history of radiation for prostate cancer and diverticulosis. -Appreciate GI and urology evaluations, no intervention to be made at this time -Continue ceftriaxone, will likely require extended course antibiotics.  Consider change to Keflex soon (sensitive)  Atrial fibrillation -Continue amiodarone, metoprolol -Continue apixaban  Anemia  -Follow CBC intermittently -Transfusion goals hemoglobin 7  History of pulmonary nodule noted 02/2020 -Plan to follow as an outpatient  Recent repair of abdominal aortic aneurysm -Vascular surgery management  Malnutrition -Tube feeding   Best practice:  Diet: Continue enteral nutrition Pain/Anxiety/Delirium protocol (if indicated: Xanax VAP protocol (if indicated): In place DVT prophylaxis: Eliquis GI prophylaxis: PPI Glucose control: euglycemic on no coverage Mobility: PT as tolerated Code Status: Full Family Communication: Discussed with daughter at bedside 11/20  Baltazar Apo, MD, PhD 05/19/2020, 9:12 AM [facility] [phone] or if no answer [phone]

## 2020-05-19 NOTE — Progress Notes (Signed)
   VASCULAR SURGERY ASSESSMENT & PLAN:   STATUS POST OPEN ABDOMINAL AORTIC ANEURYSM REPAIR: Overall the patient was doing well when I saw him this morning.  He had lasted quite a long time overnight off the ventilator.  He is being weaned off the ventilator.  Steady progress.  SUBJECTIVE:   No specific complaints this morning.   PHYSICAL EXAM:   Vitals:   05/19/20 1400 05/19/20 1500 05/19/20 1539 05/19/20 1600  BP: 125/62 (!) 139/118  (!) 124/98  Pulse: 78 76  75  Resp: (!) 31 (!) 30  (!) 34  Temp:   98.5 F (36.9 C)   TempSrc:      SpO2: 95% 100%  96%  Weight:      Height:       Good Doppler signals in both feet. Lungs are clear.  LABS:   Lab Results  Component Value Date   WBC 9.3 05/19/2020   HGB 8.7 (L) 05/19/2020   HCT 29.2 (L) 05/19/2020   MCV 103.9 (H) 05/19/2020   PLT 361 05/19/2020   Lab Results  Component Value Date   CREATININE 1.04 05/19/2020   Lab Results  Component Value Date   INR 1.3 (H) 05/07/2020   CBG (last 3)  Recent Labs    05/19/20 0745 05/19/20 1129 05/19/20 1535  GLUCAP 120* 97 93    PROBLEM LIST:    Active Problems:   AAA (abdominal aortic aneurysm) (HCC)   AKI (acute kidney injury) ([place])   Protein-calorie malnutrition, severe   Colovesical fistula   Pressure injury of skin   Acute respiratory failure (HCC)   HAP (hospital-acquired pneumonia)   Status post tracheostomy ([place])   CURRENT MEDS:   . ALPRAZolam  0.25 mg Per Tube BID  . amiodarone  200 mg Per Tube Daily  . apixaban  5 mg Per Tube BID  . arformoterol  15 mcg Nebulization BID  . atorvastatin  10 mg Per Tube QHS  . budesonide (PULMICORT) nebulizer solution  0.5 mg Nebulization BID  . chlorhexidine gluconate (MEDLINE KIT)  15 mL Mouth Rinse BID  . Chlorhexidine Gluconate Cloth  6 each Topical Daily  . cholestyramine  4 g Per Tube Daily  . darbepoetin (ARANESP) injection - NON-DIALYSIS  100 mcg Subcutaneous Q Mon-1800  . feeding supplement (PROSource TF)  45  mL Per Tube TID  . fiber  1 packet Per Tube BID  . free water  300 mL Per Tube Q4H  . lidocaine  2 patch Transdermal Q24H  . lidocaine  1 application Urethral Once  . mouth rinse  15 mL Mouth Rinse 10 times per day  . metoprolol tartrate  50 mg Oral BID  . pantoprazole sodium  40 mg Per Tube Daily    Deitra Mayo Office: [phone] 05/19/2020

## 2020-05-19 NOTE — Plan of Care (Signed)
  Problem: Education: Goal: Knowledge of General Education information will improve Description: Including pain rating scale, medication(s)/side effects and non-pharmacologic comfort measures Outcome: Progressing   Problem: Health Behavior/Discharge Planning: Goal: Ability to manage health-related needs will improve Outcome: Progressing   Problem: Clinical Measurements: Goal: Ability to maintain clinical measurements within normal limits will improve Outcome: Progressing Goal: Will remain free from infection Outcome: Progressing Goal: Diagnostic test results will improve Outcome: Progressing Goal: Respiratory complications will improve Outcome: Progressing Goal: Cardiovascular complication will be avoided Outcome: Progressing   Problem: Activity: Goal: Risk for activity intolerance will decrease Outcome: Progressing   Problem: Nutrition: Goal: Adequate nutrition will be maintained Outcome: Progressing   Problem: Coping: Goal: Level of anxiety will decrease Outcome: Progressing   Problem: Elimination: Goal: Will not experience complications related to bowel motility Outcome: Progressing Goal: Will not experience complications related to urinary retention Outcome: Progressing   Problem: Pain Managment: Goal: General experience of comfort will improve Outcome: Progressing   Problem: Safety: Goal: Ability to remain free from injury will improve Outcome: Progressing   Problem: Skin Integrity: Goal: Risk for impaired skin integrity will decrease Outcome: Progressing   Problem: Education: Goal: Knowledge of the prescribed therapeutic regimen will improve Outcome: Progressing   Problem: Bowel/Gastric: Goal: Gastrointestinal status for postoperative course will improve Outcome: Progressing   Problem: Clinical Measurements: Goal: Postoperative complications will be avoided or minimized Outcome: Progressing   Problem: Respiratory: Goal: Respiratory status will  improve Outcome: Progressing   Problem: Skin Integrity: Goal: Demonstration of wound healing without infection will improve Outcome: Progressing   Problem: Urinary Elimination: Goal: Ability to achieve and maintain adequate renal perfusion and functioning will improve Outcome: Progressing   Problem: Activity: Goal: Ability to tolerate increased activity will improve Outcome: Progressing   Problem: Respiratory: Goal: Ability to maintain a clear airway and adequate ventilation will improve Outcome: Progressing   Problem: Role Relationship: Goal: Method of communication will improve Outcome: Progressing   Problem: Education: Goal: Knowledge about tracheostomy care/management will improve Outcome: Progressing   Problem: Activity: Goal: Ability to tolerate increased activity will improve Outcome: Progressing   Problem: Health Behavior/Discharge Planning: Goal: Ability to manage tracheostomy will improve Outcome: Progressing   Problem: Respiratory: Goal: Patent airway maintenance will improve Outcome: Progressing   Problem: Role Relationship: Goal: Ability to communicate will improve Outcome: Progressing

## 2020-05-20 ENCOUNTER — Inpatient Hospital Stay (HOSPITAL_COMMUNITY): Payer: Medicare HMO

## 2020-05-20 DIAGNOSIS — J9601 Acute respiratory failure with hypoxia: Secondary | ICD-10-CM | POA: Diagnosis not present

## 2020-05-20 LAB — BASIC METABOLIC PANEL
Anion gap: 9 (ref 5–15)
BUN: 33 mg/dL — ABNORMAL HIGH (ref 8–23)
CO2: 23 mmol/L (ref 22–32)
Calcium: 8.5 mg/dL — ABNORMAL LOW (ref 8.9–10.3)
Chloride: 112 mmol/L — ABNORMAL HIGH (ref 98–111)
Creatinine, Ser: 1.05 mg/dL (ref 0.61–1.24)
GFR, Estimated: >60 mL/min (ref >60)
Glucose, Bld: 105 mg/dL — ABNORMAL HIGH (ref 70–99)
Potassium: 4.2 mmol/L (ref 3.5–5.1)
Sodium: 144 mmol/L (ref 135–145)

## 2020-05-20 LAB — GLUCOSE, CAPILLARY
Glucose-Capillary: 108 mg/dL — ABNORMAL HIGH (ref 70–99)
Glucose-Capillary: 107 mg/dL — ABNORMAL HIGH (ref 70–99)
Glucose-Capillary: 103 mg/dL — ABNORMAL HIGH (ref 70–99)

## 2020-05-20 LAB — MAGNESIUM: Magnesium: 1.8 mg/dL (ref 1.7–2.4)

## 2020-05-20 NOTE — Progress Notes (Signed)
   VASCULAR SURGERY ASSESSMENT & PLAN:   STATUS POST OPEN ABDOMINAL AORTIC ANEURYSM REPAIR: The patient has made excellent progress with the vent and is now on trach collar.  GI NUTRITION: He has a colovesical fistula.  He is tolerating his tube feeds.  ANTICOAGULATION: He is on Eliquis  ID: He is on IV Rocephin   SUBJECTIVE:   No complaints this morning.  He seems to be in good spirits.  PHYSICAL EXAM:   Vitals:   05/20/20 0300 05/20/20 0400 05/20/20 0500 05/20/20 0600  BP: 124/60 (!) 127/59 (!) 141/62 (!) 143/118  Pulse: 69 73 66 82  Resp: (!) 28 (!) 29 (!) 27 (!) 31  Temp:      TempSrc:      SpO2: 98% 100% 100% 98%  Weight:   93.5 kg   Height:       His feet are warm and well perfused. His abdominal incision right groin incision looks fine. Lungs are clear He is on trach collar  LABS:   Lab Results  Component Value Date   CREATININE 1.05 05/20/2020   CBG (last 3)  Recent Labs    05/19/20 1129 05/19/20 1535 05/19/20 2002  GLUCAP 97 93 97    PROBLEM LIST:    Active Problems:   AAA (abdominal aortic aneurysm) (HCC)   AKI (acute kidney injury) ([place])   Protein-calorie malnutrition, severe   Colovesical fistula   Pressure injury of skin   Acute respiratory failure (HCC)   HAP (hospital-acquired pneumonia)   Status post tracheostomy ([place])   CURRENT MEDS:   . ALPRAZolam  0.25 mg Per Tube BID  . amiodarone  200 mg Per Tube Daily  . apixaban  5 mg Per Tube BID  . arformoterol  15 mcg Nebulization BID  . atorvastatin  10 mg Per Tube QHS  . budesonide (PULMICORT) nebulizer solution  0.5 mg Nebulization BID  . chlorhexidine gluconate (MEDLINE KIT)  15 mL Mouth Rinse BID  . Chlorhexidine Gluconate Cloth  6 each Topical Daily  . cholestyramine  4 g Per Tube Daily  . darbepoetin (ARANESP) injection - NON-DIALYSIS  100 mcg Subcutaneous Q Mon-1800  . feeding supplement (PROSource TF)  45 mL Per Tube TID  . fiber  1 packet Per Tube BID  . free water  300 mL  Per Tube Q4H  . lidocaine  2 patch Transdermal Q24H  . lidocaine  1 application Urethral Once  . mouth rinse  15 mL Mouth Rinse 10 times per day  . metoprolol tartrate  50 mg Oral BID  . pantoprazole sodium  40 mg Per Tube Daily    Deitra Mayo Office: [phone] 05/20/2020

## 2020-05-20 NOTE — Plan of Care (Signed)
  Problem: Education: Goal: Knowledge of General Education information will improve Description: Including pain rating scale, medication(s)/side effects and non-pharmacologic comfort measures Outcome: Progressing   Problem: Health Behavior/Discharge Planning: Goal: Ability to manage health-related needs will improve Outcome: Progressing   Problem: Clinical Measurements: Goal: Ability to maintain clinical measurements within normal limits will improve Outcome: Progressing Goal: Will remain free from infection Outcome: Progressing Goal: Diagnostic test results will improve Outcome: Progressing Goal: Respiratory complications will improve Outcome: Progressing Goal: Cardiovascular complication will be avoided Outcome: Progressing   Problem: Activity: Goal: Risk for activity intolerance will decrease Outcome: Progressing   Problem: Nutrition: Goal: Adequate nutrition will be maintained Outcome: Progressing   Problem: Coping: Goal: Level of anxiety will decrease Outcome: Progressing   Problem: Elimination: Goal: Will not experience complications related to bowel motility Outcome: Progressing Goal: Will not experience complications related to urinary retention Outcome: Progressing   Problem: Pain Managment: Goal: General experience of comfort will improve Outcome: Progressing   Problem: Safety: Goal: Ability to remain free from injury will improve Outcome: Progressing   Problem: Skin Integrity: Goal: Risk for impaired skin integrity will decrease Outcome: Progressing   Problem: Education: Goal: Knowledge of the prescribed therapeutic regimen will improve Outcome: Progressing   Problem: Bowel/Gastric: Goal: Gastrointestinal status for postoperative course will improve Outcome: Progressing   Problem: Clinical Measurements: Goal: Postoperative complications will be avoided or minimized Outcome: Progressing   Problem: Respiratory: Goal: Respiratory status will  improve Outcome: Progressing   Problem: Skin Integrity: Goal: Demonstration of wound healing without infection will improve Outcome: Progressing   Problem: Urinary Elimination: Goal: Ability to achieve and maintain adequate renal perfusion and functioning will improve Outcome: Progressing   Problem: Activity: Goal: Ability to tolerate increased activity will improve Outcome: Progressing   Problem: Respiratory: Goal: Ability to maintain a clear airway and adequate ventilation will improve Outcome: Progressing   Problem: Role Relationship: Goal: Method of communication will improve Outcome: Progressing   Problem: Education: Goal: Knowledge about tracheostomy care/management will improve Outcome: Progressing   Problem: Activity: Goal: Ability to tolerate increased activity will improve Outcome: Progressing   Problem: Health Behavior/Discharge Planning: Goal: Ability to manage tracheostomy will improve Outcome: Progressing   Problem: Respiratory: Goal: Patent airway maintenance will improve Outcome: Progressing   Problem: Role Relationship: Goal: Ability to communicate will improve Outcome: Progressing

## 2020-05-20 NOTE — Progress Notes (Signed)
NAME:  Carlos Olson, MRN:  [id number], DOB:  1940-11-22, LOS: 72 ADMISSION DATE:  04/17/2020, CONSULTATION DATE: 10/21 REFERRING MD: Dr. Donzetta Matters, CHIEF COMPLAINT: AKI with mild hypoxia  Brief History   79 year old male who presented 10/18 for elective endovascular repair abdominal aortic aneurysm with Dr. Donzetta Matters. PCCM consulted 10/21 for assistance in medical management.  Hospital course complicated by AKI, metabolic encephalopathy, hypoxia/ RLL pneumonia, and hypotension. Treated in ICU for sepsis, AFRVR, underwent cardioversion, ABX course. Moved out of ICU. Ongoing course significant for AKI, AF, debility. Then 11/2 he became encephalopathic overnight and was transferred back to ICU. PCCM re-consulted.  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  Past Medical History  AAA History of prostate cancer Heart murmur Diverticulitis COPD CAD atherosclerosis  Significant Hospital Events   Admitted 10/18 for elective AAA repair 10/19 OR  11/15 fever while on cefazolin, changed to ceftriaxone  Consults:  Cardiology PCCM Nephrology GI  Procedures:  Endovascular AAA repair 10/19 Tracheostomy tube placement 11/10  Significant Diagnostic Tests:  04/21/2020 CT A/P > Status post abdominal aortic repair with aortobifemoral bypass graft. Some stranding is noted in the operative site likely related to postoperative change. No sizable hematoma is seen. Diverticulosis without diverticulitis. Near complete consolidation of the right lower lobe. Small left pleural effusion is seen with left basilar atelectasis. 10/25 CTH > 1. Stable.  No acute intracranial abnormality. 2. Atrophy with chronic small vessel white matter ischemic disease.  Micro Data:  Blood 10/28 > negative Sputum 10/28 > few candida Tracheal aspirate 11/12-MSSA 11/15 urine-E.Coli>>  pansensitive  Antimicrobials:  10/19 cefazolin  10/22 zosyn > 10/26 Vancomycin 10/29 > 11/2  Zosyn 10/29 > 11/3 Fluconazole 10/29 >>> Zyvox 11/12 11/15 ceftriaxone >>  Interim history/subjective:   It is 6 hours of MV yesterday morning 11/20 and has been on ATC ever since, made it through the night on ATC and is on is currently.   Objective   Blood pressure (!) 141/68, pulse 73, temperature 97.6 F (36.4 C), temperature source Axillary, resp. rate (!) 35, height 6\' 3"  (1.905 m), weight 93.5 kg, SpO2 100 %.    Vent Mode: Stand-by FiO2 (%):  [28 %] 28 %   Intake/Output Summary (Last 24 hours) at 05/20/2020 1028 Last data filed at 05/20/2020 0800 Gross per 24 hour  Intake 1673.11 ml  Output 1700 ml  Net -26.89 ml   Filed Weights   05/18/20 0454 05/19/20 0200 05/20/20 0500  Weight: 94.6 kg 95.1 kg 93.5 kg   Examination: General: Elderly gentleman, appears comfortable, mild tachypnea HEENT: Trach in good position, NG tube in place, oropharynx moist.  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  Mild tachypnea but otherwise appears stable.  No increased work of breathing noted.  We will try to go ATC ad lib., 24x7 if possible.  Back to  MD if he shows evidence of fatigue -Bronchodilators as ordered Follow intermittent chest  x-ray  Acute metabolic and toxic encephalopathy Improved -Avoid narcotics, tolerates poorly -All Precedex, using Xanax twice daily, would decrease when we feel appropriate to do so to minimize respiratory suppression Tylenol for fever, pain  MSSA pneumonia Colovesical fistula -etiology unclear, previous history of radiation for prostate cancer and diverticulosis. -Evaluated by GI and urology, no intervention to be made at this time -Continue ceftriaxone, suspect he will require extended course of antibiotics given the fistula.  Likely change to oral cephalosporin soon  Atrial fibrillation -Amiodarone, metoprolol -Apixaban  Anemia  -Follow CBC intermittently -Transfusion goal hemoglobin 7.0  History of pulmonary nodule noted 02/2020 -Plan to follow as an outpatient  Recent repair of abdominal aortic aneurysm -Vascular surgery management  Malnutrition -Tube feeding   Best practice:  Diet: Continue enteral nutrition Pain/Anxiety/Delirium protocol (if indicated: Xanax VAP protocol (if indicated): In place DVT prophylaxis: Eliquis GI prophylaxis: PPI Glucose control: euglycemic on no coverage Mobility: PT as tolerated Code Status: Full Family Communication: Discussed with daughter at bedside 11/20  Baltazar Apo, MD, PhD 05/20/2020, 10:28 AM [facility] [phone] or if no answer [phone]

## 2020-05-21 DIAGNOSIS — J9601 Acute respiratory failure with hypoxia: Secondary | ICD-10-CM | POA: Diagnosis not present

## 2020-05-21 LAB — GLUCOSE, CAPILLARY
Glucose-Capillary: 105 mg/dL — ABNORMAL HIGH (ref 70–99)
Glucose-Capillary: 100 mg/dL — ABNORMAL HIGH (ref 70–99)
Glucose-Capillary: 101 mg/dL — ABNORMAL HIGH (ref 70–99)
Glucose-Capillary: 86 mg/dL (ref 70–99)
Glucose-Capillary: 88 mg/dL (ref 70–99)
Glucose-Capillary: 111 mg/dL — ABNORMAL HIGH (ref 70–99)
Glucose-Capillary: 108 mg/dL — ABNORMAL HIGH (ref 70–99)
Glucose-Capillary: 116 mg/dL — ABNORMAL HIGH (ref 70–99)

## 2020-05-21 MED ORDER — METOPROLOL TARTRATE 25 MG/10 ML ORAL SUSPENSION
50.0000 mg | Freq: Two times a day (BID) | ORAL | Status: DC
  Administered 2020-05-21 – 2020-05-30 (×18): 50 mg
  Filled 2020-05-21 (×20): qty 20

## 2020-05-21 MED ORDER — CHOLESTYRAMINE 4 G PO PACK
4.0000 g | PACK | Freq: Every day | ORAL | Status: DC
  Administered 2020-05-22 – 2020-05-30 (×9): 4 g
  Filled 2020-05-21 (×9): qty 1

## 2020-05-21 MED ORDER — ALPRAZOLAM 0.25 MG PO TABS
0.2500 mg | ORAL_TABLET | Freq: Every day | ORAL | Status: DC
  Administered 2020-05-22 – 2020-05-24 (×3): 0.25 mg
  Filled 2020-05-21 (×3): qty 1

## 2020-05-21 NOTE — Progress Notes (Signed)
Speech Language Pathology Treatment: Nada Boozer Speaking valve  Patient Details Name: Carlos Olson MRN: [id number] DOB: 08-28-1940 Today's Date: 05/21/2020 Time: 6010-9323 SLP Time Calculation (min) (ACUTE ONLY): 32 min  Assessment / Plan / Recommendation Clinical Impression  Pt was seen for treatment with his wife present. He was alert and coopertive throughout the session but, per wife, the time of the session was "when he usually gets a bit of delirium and sundowning". Vitals were RR 21, SpO2 100, and HR 71 at baseline and cuff was deflated upon SLP's entry. He tolerated PMSV for 50 minutes total including the time of SLP documenting outside of his room. Vitals range was RR 13-25, SpO2 98-99, and HR 74-81 during this period and pt denied any respiratory difficulty. Vocal quality was [facility] with mild vocal wetness at the onset of placement which resolved with independent throat clearing. Vocal intensity was WNL following ~5 minutes of it being placed. Breath support and speech intelligibility were adequate. He was able to participate in conversation with the SLP and his wife and joke about multiple things. Pt's PMSV was left on at the end of the session and following completion of SLP's documentation; pt's RN was seated outside his window and agreed to monitor him so that he can continue communication with family. It is recommended that PMSV be used with all therapies when full supervision. Pt exhibited coughing with 1/3 boluses of ice chips, with 2/4 boluses of thin liquids via cup and straw and with 1/3 boluses of puree solids. No s/sx of aspiration were noted with fruit cocktail and mastication was [facility]. A modified barium swallow study is recommended to re-assess swallow function and determine safety of p.o. intake. Pt, nursing, and RN have been educated regarding results and recommendations with verbalized understanding. SLP will continue to follow pt.    HPI HPI: Pt is an 79 year old male who  presented 10/18 for elective endovascular repair abdominal aortic aneurysm. Hospital course complicated by AKI, metabolic encephalopathy, acute hypercarbic respiratory failure, hypoxia/ RLL pneumonia, and hypotension. Treated in ICU for sepsis, AFRVR, underwent cardioversion, ABX course. SLP ordered 10/28 given pt coughing with PO liquid intake. MBS 10/29: mild oropharyngeal dysphagia characterized by reduced bolus propulsion, reduced lingual retraction, reduced pharyngeal constriction, and reduced anterior laryngeal movement. Recommended dys 3 diet with thin liquid. Pt became obtunded on 11/2 and was transferred back to ICU. Pt made NPO and Cortrak placed on 11/3, SLP recommended pt remain NPO with water protocol. Pt reintubated 11/5-11/8. CXR 11/9 consistent with previous small bilateral pleural effusions and right lung base aeration. Pt had repeat bedside swallow evaluation on 11/9 and was asymptomatic for aspiration but demonstrated a baseline congested cough and increased work of breathing. Pt was re-intubated shortly after swallow evaluation and trach was placed on 11/10. CXR 11/16: Diffuse bilateral pulmonary infiltrates/edema with slight improvement.      SLP Plan  MBS;New goals to be determined pending instrumental study       Recommendations  Diet recommendations: NPO Medication Administration: Via alternative means      Patient may use Passy-Muir Speech Valve: During all therapies with supervision PMSV Supervision: Full MD: Please consider changing trach tube to : Smaller size;Cuffless          Oral Care Recommendations: Oral care QID Follow up Recommendations: LTACH (vs CIR) SLP Visit Diagnosis: Aphonia (R49.1);Dysphagia, unspecified (R13.10) Plan: MBS;New goals to be determined pending instrumental study       Laketha Leopard I. Hardin Negus, [place], [place] Office  number [phone] Pager [place] 05/21/2020,  2:17 PM

## 2020-05-21 NOTE — Progress Notes (Addendum)
Occupational Therapy Re-evaluation Patient Details Name: Carlos Olson MRN: [id number] DOB: 1940/08/10 Today's Date: 05/21/2020    History of present illness 79 year old male who presented 10/18 for elective endovascular repair abdominal aortic aneurysm. Hospital course complicated by AKI, metabolic encephalopathy, acute hypercarbic respiratory failure, hypoxia/ RLL pneumonia, and hypotension. Treated in ICU for sepsis, AFRVR, underwent cardioversion, ABX course. SLP ordered 10/28 given pt coughing with PO liquid intake. MBS 10/29: mild oropharyngeal dysphagia characterized by reduced bolus propulsion, reduced lingual retraction, reduced pharyngeal constriction, and reduced anterior laryngeal movement. Recommended dys 3 diet with thin liquid. Pt became obtunded on 11/2 and was transferred back to ICU. Pt made NPO and Cortrak placed on 11/3, SLP recommended pt remain NPO with water protocol. Pt reintubated 11/5-11/8. CXR 11/9 consistent with previous small bilateral pleural effusions and right lung base aeration.    OT comments  Pt continues to present with decreased cognition, balance, strength, and activity tolerance. Pt presenting with increased motivation to participate in therapy despite fatigue. Wife present and very supportive. Pt performing bed mobility with Mod A and requiring Max A for initial sitting balance due to significant L lateral lean. Able to correct posture with Max verbal, visual, and tactile cues. Max A +2 for sit<>stand with sara stedy to transfer to recliner. Pt would benefit from further acute OT to facilitate safe dc. Recommend dc to CIR for further OT to optimize safety, independence with ADLs, and return to PLOF.   28% FiO2 trach collar throughout session. HR 80-110s. BP stable     Follow Up Recommendations  CIR;Supervision/Assistance - 24 hour    Equipment Recommendations  3 in 1 bedside commode    Recommendations for Other Services      Precautions / Restrictions  Precautions Precautions: Fall Precaution Comments: trach with vent,  flexi seal,  Restrictions Weight Bearing Restrictions: No       Mobility Bed Mobility Overal bed mobility: Needs Assistance Bed Mobility: Supine to Sit;Sit to Supine     Supine to sit: Mod assist;+2 for physical assistance;HOB elevated     General bed mobility comments: modA for coming to the EoB, vc for LE management, reaching across body to bed rail, utilized pad scoot of hips to EOB to decrease shear on rectal tube, pt with increased L lateral lean in sitting, requires increased multimodal cuing to come to upright however unable to maintain for > 5 sec   Transfers Overall transfer level: Needs assistance   Transfers: Sit to/from Stand Sit to Stand: Max assist;+2 physical assistance;From elevated surface         General transfer comment: max Ax2 for power up, increased on L side due to lean    Balance Overall balance assessment: Needs assistance Sitting-balance support: Feet supported;Bilateral upper extremity supported Sitting balance-Leahy Scale: Poor Sitting balance - Comments: max to modA for L lean, with increased tactile cues to R trunk able to right for 5-10 sec  Postural control: Left lateral lean                                 ADL either performed or assessed with clinical judgement   ADL Overall ADL's : Needs assistance/impaired                     Lower Body Dressing: Maximal assistance;Bed level Lower Body Dressing Details (indicate cue type and reason): don socks Toilet Transfer: Maximal assistance;+2 for physical assistance;+2 for  safety/equipment (sara stedy) Toilet Transfer Details (indicate cue type and reason): Max A +2 to power up into standing with use of sara stedy and bedpad for elevating hips. Significant left lean.          Functional mobility during ADLs: Maximal assistance;+2 for physical assistance General ADL Comments: Pt continues to present  with decreased activity tolerance, balance, strength, and cognition.     Vision       Perception     Praxis      Cognition Arousal/Alertness: Awake/alert Behavior During Therapy: Flat affect Overall Cognitive Status: History of cognitive impairments - at baseline Area of Impairment: Following commands;Memory;Attention;Problem solving                   Current Attention Level: Sustained Memory: Decreased short-term memory Following Commands: Follows one step commands with increased time;Follows one step commands inconsistently     Problem Solving: Slow processing;Decreased initiation;Difficulty sequencing;Requires verbal cues General Comments: Perseverating on certain topics. Requiring simple direct cues and calm environment. Requiring increased cues and time throughout        Exercises Exercises: General Lower Extremity General Exercises - Lower Extremity Long Arc Quad: AROM;Both;10 reps;Seated   Shoulder Instructions       General Comments 28%FiO2 trach collar throughout session, HR 80-110s BP stable      Pertinent Vitals/ Pain       Pain Assessment: Faces Faces Pain Scale: Hurts little more Pain Location: generalized, incr pain left UE Pain Descriptors / Indicators: Aching;Grimacing;Guarding Pain Intervention(s): Monitored during session;Limited activity within patient's tolerance;Repositioned  Home Living Family/patient expects to be discharged to:: Private residence Living Arrangements: Spouse/significant other Available Help at Discharge: Family;Available 24 hours/day Type of Home: House Home Access: Level entry     Home Layout: One level     Bathroom Shower/Tub: Occupational psychologist: Handicapped height     Home Equipment: Environmental consultant - 2 wheels;Grab bars - tub/shower;Adaptive equipment Adaptive Equipment: Reacher;Sock aid        Prior Functioning/Environment Level of Independence: Independent            Frequency  Min  2X/week        Progress Toward Goals  OT Goals(current goals can now be found in the care plan section)  Progress towards OT goals: Progressing toward goals  Acute Rehab OT Goals Patient Stated Goal: to go home OT Goal Formulation: With patient Time For Goal Achievement: 05/02/20 Potential to Achieve Goals: Good ADL Goals Pt Will Perform Grooming: with supervision;standing Pt Will Perform Lower Body Bathing: with min assist;sit to/from stand;with adaptive equipment Pt Will Perform Lower Body Dressing: with min assist;sit to/from stand;with adaptive equipment Pt Will Transfer to Toilet: with supervision;ambulating;bedside commode Pt Will Perform Toileting - Clothing Manipulation and hygiene: with supervision;sit to/from stand Additional ADL Goal #1: Pt will perform bed mobility with min assist in preparation for ADL.  Plan Discharge plan remains appropriate    Co-evaluation    PT/OT/SLP Co-Evaluation/Treatment: Yes Reason for Co-Treatment: To address functional/ADL transfers;For patient/therapist safety;Complexity of the patient's impairments (multi-system involvement) PT goals addressed during session: Mobility/safety with mobility OT goals addressed during session: ADL's and self-care      AM-PAC OT "6 Clicks" Daily Activity     Outcome Measure   Help from another person eating meals?: A Little Help from another person taking care of personal grooming?: A Little Help from another person toileting, which includes using toliet, bedpan, or urinal?: A Lot Help from another person bathing (  including washing, rinsing, drying)?: A Lot Help from another person to put on and taking off regular upper body clothing?: A Lot Help from another person to put on and taking off regular lower body clothing?: A Lot 6 Click Score: 14    End of Session Equipment Utilized During Treatment: Oxygen  OT Visit Diagnosis: Pain;Muscle weakness (generalized) (M62.81);Unsteadiness on feet  (R26.81);Other abnormalities of gait and mobility (R26.89);Other symptoms and signs involving cognitive function   Activity Tolerance Patient limited by fatigue   Patient Left in chair;with call bell/phone within reach;with family/visitor present;with nursing/sitter in room   Nurse Communication Mobility status        Time: 8110-3159 OT Time Calculation (min): 33 min  Charges: OT General Charges $OT Visit: 1 Visit OT Evaluation $OT Re-eval: 1 Re-eval  [place], OTR/L Acute Rehab Pager: [phone] Office: [place] 05/21/2020, 5:29 PM

## 2020-05-21 NOTE — Progress Notes (Addendum)
[facility]  Subjective  - More alert and speaking this am.   Objective 128/63 71 97.6 F (36.4 C) (Axillary) (!) 29 98%  Intake/Output Summary (Last 24 hours) at 05/21/2020 0717 Last data filed at 05/21/2020 0600 Gross per 24 hour  Intake 8848.32 ml  Output 3100 ml  Net 5748.32 ml    Feet warm and well perfused Abdominal incision healing well, NTTP Right groin healing well Heart RRR Lungs trach, speech better without work to breath Moving all 4 ext.  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  NPO with NG feeding, He has a colovesical fistula. Hospital acquired Pneumonia on IV Rocephin Increase mobility as tolerates Pending rehab?    Roxy Horseman 05/21/2020 7:17 AM --  Laboratory Lab Results: Recent Labs    05/19/20 0056  WBC 9.3  HGB 8.7*  HCT 29.2*  PLT 361   BMET Recent Labs    05/19/20 0056 05/20/20 0101  NA 147* 144  K 4.0 4.2  CL 114* 112*  CO2 25 23  GLUCOSE 107* 105*  BUN 35* 33*  CREATININE 1.04 1.05  CALCIUM 8.7* 8.5*    COAG Lab Results  Component Value Date   INR 1.3 (H) 05/07/2020   INR 1.5 (H) 04/26/2020   INR 1.2 04/17/2020   No results found for: PTT  I have independently interviewed and examined patient and agree with PA assessment and plan above.  He is somnolent today.  He has tolerated trach collar for over a day.  Continues to require tube feeding and will need antibiotics for colovesicular fistula.  We will begin to evaluate for disposition.  Laurieann Friddle C. Donzetta Matters, MD [facility] Office: [phone] Pager: [phone]

## 2020-05-21 NOTE — Progress Notes (Signed)
Physical Therapy Treatment Patient Details Name: Carlos Olson MRN: [id number] DOB: 1941-04-17 Today's Date: 05/21/2020    History of Present Illness 79 year old male who presented 10/18 for elective endovascular repair abdominal aortic aneurysm. Hospital course complicated by AKI, metabolic encephalopathy, acute hypercarbic respiratory failure, hypoxia/ RLL pneumonia, and hypotension. Treated in ICU for sepsis, AFRVR, underwent cardioversion, ABX course. SLP ordered 10/28 given pt coughing with PO liquid intake. MBS 10/29: mild oropharyngeal dysphagia characterized by reduced bolus propulsion, reduced lingual retraction, reduced pharyngeal constriction, and reduced anterior laryngeal movement. Recommended dys 3 diet with thin liquid. Pt became obtunded on 11/2 and was transferred back to ICU. Pt made NPO and Cortrak placed on 11/3, SLP recommended pt remain NPO with water protocol. Pt reintubated 11/5-11/8. CXR 11/9 consistent with previous small bilateral pleural effusions and right lung base aeration.     PT Comments    [name] reports he typically has episodes of sundowning at time of therapy session. She request increased cuing for success with command follow. Pt is limited in safe mobility by decreased strength especially in L side with associated increased L lateral lean, contributing to decreased balance.  Pt requires maximal multimodal cuing for mobility. Pt requires modA for bed mobility. Once in seated requires mod-maxA for seated balance. Pt is maxA for power up to [facility] from elevated bed surface and then for power up from [place] to come to seated in recliner. Once seated in recliner worked with pt on LE exercises. Pt is making slow progress towards his goals. D/c plans remain appropriate.    Follow Up Recommendations  LTACH;Supervision/Assistance - 24 hour     Equipment Recommendations  3in1 (PT)       Precautions / Restrictions Precautions Precautions: Fall Precaution  Comments: trach with vent,  flexi seal, Primo fit Restrictions Weight Bearing Restrictions: No    Mobility  Bed Mobility Overal bed mobility: Needs Assistance Bed Mobility: Supine to Sit;Sit to Supine     Supine to sit: Mod assist;+2 for physical assistance;HOB elevated     General bed mobility comments: modA for coming to the EoB, vc for LE management, reaching across body to bed rail, utilized pad scoot of hips to EOB to decrease shear on rectal tube, pt with increased L lateral lean in sitting, requires increased multimodal cuing to come to upright however unable to maintain for > 5 sec   Transfers Overall transfer level: Needs assistance   Transfers: Sit to/from Stand Sit to Stand: +2 physical assistance;Max assist;From elevated surface         General transfer comment: max Ax2 for power up, increased on L side due to lean        Balance Overall balance assessment: Needs assistance Sitting-balance support: Feet supported;Bilateral upper extremity supported Sitting balance-Leahy Scale: Zero Sitting balance - Comments: max to modA for L lean, with increased tactile cues to R trunk able to right for 5-10 sec  Postural control: Left lateral lean                                  Cognition Arousal/Alertness: Awake/alert Behavior During Therapy: Flat affect Overall Cognitive Status: History of cognitive impairments - at baseline Area of Impairment: Following commands;Memory;Attention;Problem solving                   Current Attention Level: Sustained Memory: Decreased short-term memory Following Commands: Follows one step commands with increased time;Follows one  step commands inconsistently     Problem Solving: Slow processing;Decreased initiation;Difficulty sequencing;Requires verbal cues General Comments: pt repetitive, requesting increased cuing to be able to perform tasks      Exercises General Exercises - Lower Extremity Long Arc Quad:  AROM;Both;10 reps;Seated    General Comments General comments (skin integrity, edema, etc.): 28%FiO2 throughout session, HR 80-110s BP stable        Pertinent Vitals/Pain Pain Assessment: Faces Faces Pain Scale: Hurts little more Pain Location: generalized, incr pain left UE Pain Descriptors / Indicators: Aching;Grimacing;Guarding    Home Living Family/patient expects to be discharged to:: Private residence Living Arrangements: Spouse/significant other Available Help at Discharge: Family;Available 24 hours/day Type of Home: House Home Access: Level entry   Home Layout: One level Home Equipment: Walker - 2 wheels;Grab bars - tub/shower;Adaptive equipment      Prior Function Level of Independence: Independent          PT Goals (current goals can now be found in the care plan section) Acute Rehab PT Goals Patient Stated Goal: to go home PT Goal Formulation: With patient Time For Goal Achievement: 05/24/20 Potential to Achieve Goals: Good Progress towards PT goals: Progressing toward goals    Frequency    Min 2X/week      PT Plan Current plan remains appropriate    Co-evaluation PT/OT/SLP Co-Evaluation/Treatment: Yes Reason for Co-Treatment: Complexity of the patient's impairments (multi-system involvement) PT goals addressed during session: Mobility/safety with mobility        AM-PAC PT "6 Clicks" Mobility   Outcome Measure  Help needed turning from your back to your side while in a flat bed without using bedrails?: A Lot Help needed moving from lying on your back to sitting on the side of a flat bed without using bedrails?: A Lot Help needed moving to and from a bed to a chair (including a wheelchair)?: A Lot Help needed standing up from a chair using your arms (e.g., wheelchair or bedside chair)?: A Lot Help needed to walk in hospital room?: Total Help needed climbing 3-5 steps with a railing? : Total 6 Click Score: 10    End of Session Equipment  Utilized During Treatment: Gait belt;Oxygen (trach collar) Activity Tolerance: Patient limited by fatigue Patient left: with call bell/phone within reach;with family/visitor present;in chair Nurse Communication: Mobility status;Need for lift equipment PT Visit Diagnosis: Muscle weakness (generalized) (M62.81);Pain Pain - Right/Left: Left Pain - part of body: Shoulder     Time: 8675-4492 PT Time Calculation (min) (ACUTE ONLY): 34 min  Charges:  $Therapeutic Exercise: 8-22 mins                     Jazzlene Huot B. Migdalia Dk PT, DPT Acute Rehabilitation Services Pager [phone] Office [phone]    [place] 05/21/2020, 4:57 PM

## 2020-05-21 NOTE — Progress Notes (Signed)
NAME:  Carlos Olson, MRN:  [id number], DOB:  08-09-1940, LOS: 46 ADMISSION DATE:  04/17/2020, CONSULTATION DATE: 10/21 REFERRING MD: Dr. Donzetta Matters, CHIEF COMPLAINT: AKI with mild hypoxia  Brief History   79 year old male who presented 10/18 for elective endovascular repair abdominal aortic aneurysm with Dr. Donzetta Matters. PCCM consulted 10/21 for assistance in medical management.  Hospital course complicated by AKI, metabolic encephalopathy, hypoxia/ RLL pneumonia, and hypotension. Treated in ICU for sepsis, AFRVR, underwent cardioversion, ABX course. Moved out of ICU. Ongoing course significant for AKI, AF, debility. Then 11/2 he became encephalopathic overnight and was transferred back to ICU. PCCM re-consulted.  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  Past Medical History  AAA History of prostate cancer Heart murmur Diverticulitis COPD CAD atherosclerosis  Significant Hospital Events   Admitted 10/18 for elective AAA repair 10/19 OR  11/15 fever while on cefazolin, changed to ceftriaxone  Consults:  Cardiology PCCM Nephrology GI  Procedures:  Endovascular AAA repair 10/19 Tracheostomy tube placement 11/10  Significant Diagnostic Tests:  04/21/2020 CT A/P > Status post abdominal aortic repair with aortobifemoral bypass graft. Some stranding is noted in the operative site likely related to postoperative change. No sizable hematoma is seen. Diverticulosis without diverticulitis. Near complete consolidation of the right lower lobe. Small left pleural effusion is seen with left basilar atelectasis. 10/25 CTH > 1. Stable.  No acute intracranial abnormality. 2. Atrophy with chronic small vessel white matter ischemic disease.  Micro Data:  Blood 10/28 > negative Sputum 10/28 > few candida Tracheal aspirate 11/12-MSSA 11/15 urine-E.Coli>>  pansensitive  Antimicrobials:  10/19 cefazolin  10/22 zosyn > 10/26 Vancomycin 10/29 > 11/2  Zosyn 10/29 > 11/3 Fluconazole 10/29 >>> Zyvox 11/12 11/15 ceftriaxone >>>  Interim history/subjective:   On ATC for ~ 48 hours at this point. Tolerating well from a respiratory standpoint. Is somewhat somnolent today. No complaints.   Objective   Blood pressure (!) 141/68, pulse 67, temperature 98.5 F (36.9 C), temperature source Axillary, resp. rate (!) 29, height 6\' 3"  (1.905 m), weight 93 kg, SpO2 99 %.    FiO2 (%):  [28 %] 28 %   Intake/Output Summary (Last 24 hours) at 05/21/2020 0949 Last data filed at 05/21/2020 0800 Gross per 24 hour  Intake 3508.32 ml  Output 3100 ml  Net 408.32 ml   Filed Weights   05/19/20 0200 05/20/20 0500 05/21/20 0600  Weight: 95.1 kg 93.5 kg 93 kg   Examination: General: elderly appearing male in NAD HEENT: [place]/AT, Trach in place, dressing CDI.  Cardiovascular: Regular, distant, no murmur Respiratory: Mild tachypnea, clear bilateral breath sounds. No distress. On ATC Gastrointestinal: Soft, nondistended with positive bowel sounds GU: Feculent appearing urine Ext: No significant edema.  Derm: No rash Neuro: Wakes to voice then quickly back to sleep, nods to questions, follows commands, very weak cough   Resolved Hospital Problem list   Hypotension due to hypovolemia  RLL pneumonia Assessment & Plan:   Acute hypercarbic respiratory failure, now chronic failure in the setting of critical illness Status post tracheostomy 11/10 COPD without acute exacerbation - Has tolerated about 48 hours of ATC.  Mild tachypnea but otherwise appears stable.  No increased work of breathing noted. - Bronchodilators as ordered  Acute metabolic and toxic encephalopathy: Improved - Avoid narcotics, tolerates poorly - Off precedex - Will decrease xanax dosing to once daily QHS to hopefully increase wakefulness during the  day.  - Re-establish sleep/wake  cycles.   MSSA pneumonia Colovesical fistula -etiology unclear, previous history of radiation for prostate cancer and diverticulosis. - Evaluated by GI and urology, no intervention to be made at this time - Continue ceftriaxone, suspect he will require extended course of antibiotics given the fistula. Need to determine duration of therapy.     Atrial fibrillation -Amiodarone, metoprolol -Apixaban  Anemia  -Follow CBC intermittently -Transfusion goal: hemoglobin 7.0  History of pulmonary nodule noted 02/2020 -Plan to follow as an outpatient  Recent repair of abdominal aortic aneurysm -Vascular surgery management  Malnutrition -Tube feeding - Ongoing SLP eval, currently recommending NPO  Hopefully will soon be a candidate for CIR.    Best practice:  Diet: Continue enteral nutrition TF Pain/Anxiety/Delirium protocol (if indicated: Xanax decreased VAP protocol (if indicated): In place DVT prophylaxis: Eliquis GI prophylaxis: PPI Glucose control: euglycemic on no coverage Mobility: PT as tolerated Code Status: Full Family Communication: Discussed with wife at bedside 11/22   Georgann Housekeeper, AGACNP-BC [place]  See [place] for personal pager PCCM on call pager [phone]  05/21/2020 10:36 AM

## 2020-05-22 ENCOUNTER — Inpatient Hospital Stay (HOSPITAL_COMMUNITY): Payer: Medicare HMO

## 2020-05-22 ENCOUNTER — Ambulatory Visit: Payer: Medicare HMO

## 2020-05-22 DIAGNOSIS — J9601 Acute respiratory failure with hypoxia: Secondary | ICD-10-CM | POA: Diagnosis not present

## 2020-05-22 LAB — CBC
HCT: 29.3 % — ABNORMAL LOW (ref 39.0–52.0)
Hemoglobin: 9 g/dL — ABNORMAL LOW (ref 13.0–17.0)
MCH: 30.2 pg (ref 26.0–34.0)
MCHC: 30.7 g/dL (ref 30.0–36.0)
MCV: 98.3 fL (ref 80.0–100.0)
Platelets: 386 10*3/uL (ref 150–400)
RBC: 2.98 MIL/uL — ABNORMAL LOW (ref 4.22–5.81)
RDW: 15.9 % — ABNORMAL HIGH (ref 11.5–15.5)
WBC: 7.8 10*3/uL (ref 4.0–10.5)
nRBC: 0 % (ref 0.0–0.2)

## 2020-05-22 LAB — BASIC METABOLIC PANEL
Anion gap: 12 (ref 5–15)
BUN: 27 mg/dL — ABNORMAL HIGH (ref 8–23)
CO2: 21 mmol/L — ABNORMAL LOW (ref 22–32)
Calcium: 8.4 mg/dL — ABNORMAL LOW (ref 8.9–10.3)
Chloride: 105 mmol/L (ref 98–111)
Creatinine, Ser: 0.9 mg/dL (ref 0.61–1.24)
GFR, Estimated: >60 mL/min (ref >60)
Glucose, Bld: 109 mg/dL — ABNORMAL HIGH (ref 70–99)
Potassium: 4 mmol/L (ref 3.5–5.1)
Sodium: 138 mmol/L (ref 135–145)

## 2020-05-22 LAB — GLUCOSE, CAPILLARY
Glucose-Capillary: 108 mg/dL — ABNORMAL HIGH (ref 70–99)
Glucose-Capillary: 110 mg/dL — ABNORMAL HIGH (ref 70–99)
Glucose-Capillary: 110 mg/dL — ABNORMAL HIGH (ref 70–99)
Glucose-Capillary: 112 mg/dL — ABNORMAL HIGH (ref 70–99)
Glucose-Capillary: 95 mg/dL (ref 70–99)
Glucose-Capillary: 97 mg/dL (ref 70–99)

## 2020-05-22 MED ORDER — ENSURE ENLIVE PO LIQD: 237.0000 mL | Freq: Two times a day (BID) | ORAL | Status: DC

## 2020-05-22 MED ORDER — VITAL 1.5 CAL PO LIQD
1000.0000 mL | ORAL | Status: DC
  Administered 2020-05-23 – 2020-05-26 (×5): 1000 mL
  Filled 2020-05-22 (×2): qty 1000

## 2020-05-22 MED ORDER — ENSURE ENLIVE PO LIQD
237.0000 mL | Freq: Two times a day (BID) | ORAL | Status: DC
  Administered 2020-05-22 – 2020-05-29 (×9): 237 mL

## 2020-05-22 NOTE — Progress Notes (Signed)
Inpatient Rehabilitation Admissions Coordinator  Inpatient rehab consult received. I met at bedside with patient and his son in law. We discussed goals and expectations of a possible Cir admit. I feel he is a good candidate once he progresses further with therapy to demonstrate the ability for more aggressive therapy. I will follow.  Danne Baxter, RN, MSN Rehab Admissions Coordinator [phone] 05/22/2020 12:28 PM

## 2020-05-22 NOTE — Progress Notes (Signed)
  Speech Language Pathology Treatment: Nada Boozer Speaking valve  Patient Details Name: Carlos Olson MRN: [id number] DOB: 1941-02-03 Today's Date: 05/22/2020 Time: 1749-4496 SLP Time Calculation (min) (ACUTE ONLY): 10 min  Assessment / Plan / Recommendation Clinical Impression  Pt seen for PMSV placement and tx in preparation for and during MBS. Pt alert and participatory, initially unintelligible cuff leak speech without PMSV in place. Repositioned and PMSV placed with immediate intelligibility, no secretions mobilized, no change in vital signs. Discussed benefits and general precautions with pts son in law who will be at bedside for the day. Pt tolerated sustained usage of the valve during transport to and from [facility] and during assessment without difficulty. Recommend pt wear PMSV all waking hours, with supervision from family at bedside adequate. PMSV to be removed if pt will be unsupervised for more than 10 minutes or if pt going to sleep. Will f/u for further family training as needed.   HPI HPI: Pt is an 79 year old male who presented 10/18 for elective endovascular repair abdominal aortic aneurysm. Hospital course complicated by AKI, metabolic encephalopathy, acute hypercarbic respiratory failure, hypoxia/ RLL pneumonia, and hypotension. Treated in ICU for sepsis, AFRVR, underwent cardioversion, ABX course. SLP ordered 10/28 given pt coughing with PO liquid intake. MBS 10/29: mild oropharyngeal dysphagia characterized by reduced bolus propulsion, reduced lingual retraction, reduced pharyngeal constriction, and reduced anterior laryngeal movement. Recommended dys 3 diet with thin liquid. Pt became obtunded on 11/2 and was transferred back to ICU. Pt made NPO and Cortrak placed on 11/3, SLP recommended pt remain NPO with water protocol. Pt reintubated 11/5-11/8. CXR 11/9 consistent with previous small bilateral pleural effusions and right lung base aeration. Pt had repeat bedside swallow evaluation  on 11/9 and was asymptomatic for aspiration but demonstrated a baseline congested cough and increased work of breathing. Pt was re-intubated shortly after swallow evaluation and trach was placed on 11/10. CXR 11/16: Diffuse bilateral pulmonary infiltrates/edema with slight improvement.      SLP Plan  MBS;New goals to be determined pending instrumental study       Recommendations         Patient may use Passy-Muir Speech Valve: During all waking hours (remove during sleep);During PO intake/meals;Caregiver trained to provide supervision;During all therapies with supervision PMSV Supervision: Full MD: Please consider changing trach tube to : Cuffless;Smaller size         Plan: MBS;New goals to be determined pending instrumental study       GO               Herbie Baltimore, MA [place] Pager [phone] Office [phone] Lynann Beaver 05/22/2020, 10:37 AM

## 2020-05-22 NOTE — Progress Notes (Addendum)
NAME:  Carlos Olson, MRN:  [id number], DOB:  [birth year], LOS: 40 ADMISSION DATE:  04/17/2020, CONSULTATION DATE: 10/21 REFERRING MD: Dr. Donzetta Matters, CHIEF COMPLAINT: AKI with mild hypoxia  Brief History   79 year old male who presented 10/18 for elective endovascular repair abdominal aortic aneurysm with Dr. Donzetta Matters. PCCM consulted 10/21 for assistance in medical management.  Hospital course complicated by AKI, metabolic encephalopathy, hypoxia/ RLL pneumonia, and hypotension. Treated in ICU for sepsis, AFRVR, underwent cardioversion, ABX course. Moved out of ICU. Ongoing course significant for AKI, AF, debility. Then 11/2 he became encephalopathic overnight and was transferred back to ICU. PCCM re-consulted.  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  Past Medical History  AAA History of prostate cancer Heart murmur Diverticulitis COPD CAD atherosclerosis  Significant Hospital Events   Admitted 10/18 for elective AAA repair 10/19 OR  11/15 fever while on cefazolin, changed to ceftriaxone  Consults:  Cardiology PCCM Nephrology GI  Procedures:  Endovascular AAA repair 10/19 Tracheostomy tube placement 11/10  Significant Diagnostic Tests:  04/21/2020 CT A/P > Status post abdominal aortic repair with aortobifemoral bypass graft. Some stranding is noted in the operative site likely related to postoperative change. No sizable hematoma is seen. Diverticulosis without diverticulitis. Near complete consolidation of the right lower lobe. Small left pleural effusion is seen with left basilar atelectasis. 10/25 CTH > 1. Stable.  No acute intracranial abnormality. 2. Atrophy with chronic small vessel white matter ischemic disease.  Micro Data:  Blood 10/28 > negative Sputum 10/28 > few candida Tracheal aspirate 11/12-MSSA 11/15 urine-E.Coli>>  pansensitive  Antimicrobials:  10/19 cefazolin  10/22 zosyn > 10/26 Vancomycin 10/29 > 11/2  Zosyn 10/29 > 11/3 Fluconazole 10/29 >>> Zyvox 11/12 11/15 ceftriaxone >>>  Interim history/subjective:   On ATC 28% for ~ 72 hours at this point. Tolerating well from a respiratory standpoint.Awake and alert, interactive, less somnolent Minimal secretions  +35 L per I&O ? Hemodynamically stable WBC of 7 HGB 9 Creatinine 0.9>> Continues to improve Platelets 306  Objective   Blood pressure 114/63, pulse 79, temperature 97.7 F (36.5 C), temperature source Oral, resp. rate (!) 23, height 6\' 3"  (1.905 m), weight 92.7 kg, SpO2 99 %.    FiO2 (%):  [28 %] 28 %   Intake/Output Summary (Last 24 hours) at 05/22/2020 0820 Last data filed at 05/22/2020 0800 Gross per 24 hour  Intake 2958.09 ml  Output 3000 ml  Net -41.91 ml   Filed Weights   05/20/20 0500 05/21/20 0600 05/22/20 0500  Weight: 93.5 kg 93 kg 92.7 kg   Examination: General: elderly appearing male in NAD, on ATC HEENT: [place]/AT, Trach secure and  in place, dressing CDI. Minimal secretions, diminished per bases Cardiovascular: S1, S2, RRR, distant, no murmur Respiratory: Mild tachypnea, clear bilateral breath sounds. No distress. On ATC Gastrointestinal: Soft, nondistended with positive bowel sounds GU: Feculent appearing urine, colovesical fistula Ext: No significant edema.  Derm: No rash, warm and dry, intact Neuro: Awake and alert, answering questions, follows commands, weak but interactive   Resolved Hospital Problem list   Hypotension due to hypovolemia  RLL pneumonia Assessment & Plan:   Acute hypercarbic respiratory failure, now chronic failure in the setting of critical illness Status post tracheostomy 11/10 COPD without acute exacerbation - Has tolerated about 72 hours of ATC.  Mild tachypnea but otherwise appears stable.  No increased work of breathing noted. - Bronchodilators as  ordered - Sat goal > 88% -  Consider gentle diuresis - Trend CXR prn  Acute metabolic and toxic encephalopathy: Improved - Avoid narcotics, tolerates poorly - Off precedex - Will decrease xanax dosing to once daily QHS to hopefully increase wakefulness during the day.  - Re-establish sleep/wake cycles.  - Frequent re-orientation, lights on during the day, off at bedtime  MSSA pneumonia Colovesical fistula -etiology unclear, previous history of radiation for prostate cancer and diverticulosis. - Evaluated by GI and urology, no intervention to be made at this time - Continue ceftriaxone, suspect he will require extended course of antibiotics given the fistula. Need to determine duration of therapy.     Atrial fibrillation -Amiodarone, metoprolol -Apixaban - Will need OP imaging  follow up to monitor for amio lung toxicity ( previous diagnosis of COPD)>> Followed by Dr. Lamonte Sakai   Anemia  -Follow CBC intermittently -Transfusion goal: hemoglobin 7.0 - Monitor for active bleeding  History of pulmonary nodule noted 02/2020 -Plan to follow as an outpatient - Pt is due for 3 month follow up 05/2020  Recent repair of abdominal aortic aneurysm -Vascular surgery management  Malnutrition -Tube feeding - Ongoing SLP eval, currently recommending NPO  Hopefully will soon be a candidate for CIR.  For Barium swallow 11/23   PCCM APP Time 35 minutes   Best practice:  Diet: Continue enteral nutrition TF Pain/Anxiety/Delirium protocol (if indicated: Xanax decreased VAP protocol (if indicated): In place DVT prophylaxis: Eliquis GI prophylaxis: PPI Glucose control: euglycemic on no coverage Mobility: PT as tolerated Code Status: Full Family Communication: Discussed with wife at bedside 11/22  Magdalen Spatz, MSN, AGACNP-BC [place] for personal pager PCCM on call pager [phone]  05/22/2020 8:20 AM

## 2020-05-22 NOTE — Progress Notes (Signed)
Modified Barium Swallow Progress Note  Patient Details  Name: Carlos Olson MRN: [id number] Date of Birth: [birth year]  Today's Date: 05/22/2020  Modified Barium Swallow completed.  Full report located under Chart Review in the Imaging Section.  Brief recommendations include the following:  Clinical Impression  Pt was seen for MBS, which revealed mild oropharyngeal dysphagia characterized by weak lingual manipulation, reduced bolus propulsion, and reduced laryngeal mobility and closure. His swallow function is slightly improved as compared to the previous MBS on 04/27/20 as he demonstrated overall increased strength of oropharyngeal musculature. In particular, the oral phase of swallowing was more efficient in the present study. The pt's slightly reduced laryngeal mobility and closure lead to ocassional instances of penetration during the swallow with thin liquids. On one ocassion, a trace amount passed below the vocal folds without any attempt to eject the material (PAS 8). No other instances of penetration/aspiration were observed. Pharyngeal residue was observed in the valleculae and pyriforms. However, he often produced an additional swallow independently that cleared some of the residue. At this time, recommend dys 3 diet with thin liquids. Encourage frequent throat clearing throughout meal, and pt should follow solids with liquids. SLP will f/u acutely for diet toleration and advancement.    Swallow Evaluation Recommendations       SLP Diet Recommendations: Dysphagia 3 (Mech soft) solids;Thin liquid   Liquid Administration via: Cup;Straw   Medication Administration: Whole meds with liquid   Supervision: Staff to assist with self feeding   Compensations: Slow rate;Small sips/bites;Minimize environmental distractions;Follow solids with liquid;Clear throat intermittently   Postural Changes: Seated upright at 90 degrees   Oral Care Recommendations: Oral care BID        Greggory Keen 05/22/2020,11:09 AM

## 2020-05-22 NOTE — Progress Notes (Signed)
Nutrition Follow-up  DOCUMENTATION CODES:   Severe malnutrition in context of acute illness/injury  INTERVENTION:   Ensure Enlive po BID, each supplement provides 350 kcal and 20 grams of protein  Tube Feeding via Cortrak-Switch to Nocturnal Feedings:  Change to Osmolite 1.5 at 75 ml/hr x 14 hours per day Pro-Source 45 mL TID Provides 1695 kcals, 99 g of protein  Meets 75% calorie needs, 80% protein needs   NUTRITION DIAGNOSIS:   Severe Malnutrition related to acute illness (AAA repair surgery with minimal intake x 10 days since admission) as evidenced by moderate muscle depletion, energy intake < or equal to 50% for > or equal to 5 days.  Being addressed via TF and diet advancement   GOAL:   Patient will meet greater than or equal to 90% of their needs  Progressing  MONITOR:   PO intake, Supplement acceptance  REASON FOR ASSESSMENT:   Rounds    ASSESSMENT:   79 yo male admitted 10/18 for elective endovascular repair of AAA. PMH includes AAA, prostate Ca, heart murmur, diverticulitis, COPD, CAD, atherosclerosis, IBS.  11/02Obtunded, hypercarbic, transferred to ICU, NG tube inserted, TF started  11/03 Cortrak placed 11/05 Re-Intubated 11/10 Trach placed, possible colovesical fistula, stool in urine 11/23 Diet advanced to Dysphagia 3, thins post MBS  Pt in good spirits today, smiling and laughing, telling joknes.  Pt passed modified barium swallow today  Tolerating Vital 1.5 at 60 ml/hr with Pro-Source 45 mL TID via Cortrak tube. Free water flush of 300 mL q 4 hours  Pt with 3000 mL via rectal tube which is combined urine and stool (given colovesical fistula); unsure actually how much stool pt is putting out. Noted Questran dose increased today, continues with nutrisource fiber  Labs: reviewed Meds: reviewed  Diet Order:   Diet Order            DIET DYS 3 Room service appropriate? Yes; Fluid consistency: Thin  Diet effective now                  EDUCATION NEEDS:   No education needs have been identified at this time  Skin:  Skin Assessment: Reviewed RN Assessment (abd & R groin surgical incisions)  Last BM:  11/23 rectal tube with colovesical fistula  Height:   Ht Readings from Last 1 Encounters:  05/07/20 6\' 3"  (1.905 m)    Weight:   Wt Readings from Last 1 Encounters:  05/22/20 92.7 kg    Ideal Body Weight:  89.1 kg  BMI:  Body mass index is 25.54 kg/m.  Estimated Nutritional Needs:   Kcal:  2200-2400 kcals  Protein:  120-145 g  Fluid:  >/= 2.2 L   Kerman Passey MS, RDN, LDN, CNSC Registered Dietitian III Clinical Nutrition RD Pager and On-Call Pager Number Located in [place]

## 2020-05-22 NOTE — Progress Notes (Signed)
Rehab Admissions Coordinator Note:  Per OT recommendation, this Patient was screened by Raechel Ache for appropriateness for an Inpatient Acute Rehab Consult.  At this time, we are recommending Inpatient Rehab consult. AC will contact MD to request consult order.   Raechel Ache 05/22/2020, 9:21 AM  I can be reached at [phone].

## 2020-05-22 NOTE — Progress Notes (Addendum)
Progress Note    05/22/2020 8:02 AM 26 Days Post-Op  Subjective:  Looks good this morning.  Tolerating TC  Afebrile HR 60's-80's NSR 993'Z-169'C systolic 789% TC  Vitals:   05/22/20 0700 05/22/20 0742  BP: 117/69   Pulse: 67   Resp: (!) 29   Temp:  97.7 F (36.5 C)  SpO2: 100%     Physical Exam: Cardiac:  regular Lungs:  100% on TC; receiving pulmonary toilet Incisions:  Midline and bilateral groin incisions are healing nicely Extremities:  Bilateral feet are warm and well perfused with + doppler signals right PT and left DP. Abdomen:  Soft, NT/ND  CBC    Component Value Date/Time   WBC 7.8 05/22/2020 0015   RBC 2.98 (L) 05/22/2020 0015   HGB 9.0 (L) 05/22/2020 0015   HGB 12.1 (L) 08/20/2012 0610   HCT 29.3 (L) 05/22/2020 0015   HCT 44.0 08/02/2012 0853   PLT 386 05/22/2020 0015   PLT 158 08/20/2012 0610   MCV 98.3 05/22/2020 0015   MCV 99 08/02/2012 0853   MCH 30.2 05/22/2020 0015   MCHC 30.7 05/22/2020 0015   RDW 15.9 (H) 05/22/2020 0015   RDW 13.7 08/02/2012 0853   LYMPHSABS 0.7 05/19/2020 0056   MONOABS 0.9 05/19/2020 0056   EOSABS 0.5 05/19/2020 0056   BASOSABS 0.1 05/19/2020 0056    BMET    Component Value Date/Time   NA 138 05/22/2020 0015   NA 136 08/20/2012 0610   K 4.0 05/22/2020 0015   K 4.2 08/20/2012 0610   CL 105 05/22/2020 0015   CL 102 08/20/2012 0610   CO2 21 (L) 05/22/2020 0015   CO2 26 08/20/2012 0610   GLUCOSE 109 (H) 05/22/2020 0015   GLUCOSE 123 (H) 08/20/2012 0610   BUN 27 (H) 05/22/2020 0015   BUN 7 08/20/2012 0610   CREATININE 0.90 05/22/2020 0015   CREATININE 0.75 08/20/2012 0610   CALCIUM 8.4 (L) 05/22/2020 0015   CALCIUM 8.4 (L) 08/20/2012 0610   GFRNONAA >60 05/22/2020 0015   GFRNONAA >60 08/20/2012 0610   GFRAA >60 05/07/2017 0949   GFRAA >60 08/20/2012 0610    INR    Component Value Date/Time   INR 1.3 (H) 05/07/2020 0435   INR 1.0 08/02/2012 0853     Intake/Output Summary (Last 24 hours) at  05/22/2020 0802 Last data filed at 05/22/2020 0700 Gross per 24 hour  Intake 2898.09 ml  Output 3000 ml  Net -101.91 ml     Assessment:  79 y.o. 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  He is much more alert.  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  Hopefully we can get him closer to CIR requirements in the coming days.  Zakeria Kulzer C. Donzetta Matters, MD [facility] [place] Office: [phone] Pager:  [phone]   

## 2020-05-23 ENCOUNTER — Inpatient Hospital Stay (HOSPITAL_COMMUNITY): Payer: Medicare HMO

## 2020-05-23 DIAGNOSIS — J9601 Acute respiratory failure with hypoxia: Secondary | ICD-10-CM | POA: Diagnosis not present

## 2020-05-23 LAB — BASIC METABOLIC PANEL
Anion gap: 10 (ref 5–15)
BUN: 23 mg/dL (ref 8–23)
CO2: 23 mmol/L (ref 22–32)
Calcium: 8.5 mg/dL — ABNORMAL LOW (ref 8.9–10.3)
Chloride: 106 mmol/L (ref 98–111)
Creatinine, Ser: 0.99 mg/dL (ref 0.61–1.24)
GFR, Estimated: >60 mL/min (ref >60)
Glucose, Bld: 102 mg/dL — ABNORMAL HIGH (ref 70–99)
Potassium: 4.1 mmol/L (ref 3.5–5.1)
Sodium: 139 mmol/L (ref 135–145)

## 2020-05-23 LAB — CBC
HCT: 29.1 % — ABNORMAL LOW (ref 39.0–52.0)
Hemoglobin: 9.2 g/dL — ABNORMAL LOW (ref 13.0–17.0)
MCH: 31.2 pg (ref 26.0–34.0)
MCHC: 31.6 g/dL (ref 30.0–36.0)
MCV: 98.6 fL (ref 80.0–100.0)
Platelets: 371 10*3/uL (ref 150–400)
RBC: 2.95 MIL/uL — ABNORMAL LOW (ref 4.22–5.81)
RDW: 15.9 % — ABNORMAL HIGH (ref 11.5–15.5)
WBC: 7.3 10*3/uL (ref 4.0–10.5)
nRBC: 0 % (ref 0.0–0.2)

## 2020-05-23 LAB — GLUCOSE, CAPILLARY
Glucose-Capillary: 105 mg/dL — ABNORMAL HIGH (ref 70–99)
Glucose-Capillary: 132 mg/dL — ABNORMAL HIGH (ref 70–99)
Glucose-Capillary: 95 mg/dL (ref 70–99)
Glucose-Capillary: 74 mg/dL (ref 70–99)
Glucose-Capillary: 100 mg/dL — ABNORMAL HIGH (ref 70–99)

## 2020-05-23 LAB — MAGNESIUM: Magnesium: 1.7 mg/dL (ref 1.7–2.4)

## 2020-05-23 MED ORDER — LIDOCAINE 5 % EX PTCH
2.0000 | MEDICATED_PATCH | CUTANEOUS | Status: DC
  Administered 2020-05-23 – 2020-05-25 (×3): 2 via TRANSDERMAL
  Filled 2020-05-23 (×3): qty 2

## 2020-05-23 NOTE — Progress Notes (Signed)
  Speech Language Pathology Treatment: Nada Boozer Speaking valve;Dysphagia  Patient Details Name: Carlos Olson MRN: [id number] DOB: 12-20-1940 Today's Date: 05/23/2020 Time: 1916-6060 SLP Time Calculation (min) (ACUTE ONLY): 29 min  Assessment / Plan / Recommendation Clinical Impression  Pt was seen for treatment with his daughter present. He was alert and cooperative throughout the session. With PMSV placed he demonstrated immediate intelligibility, no secretions mobilized, no change in vital signs. A majority of the session was focused on pt and family education. Pt and daughter were educated on PMV use and precautions. SLP demonstrated placement of PMV and daughter return demonstrated. Provided daughter with handout regarding precautions. Pt and daughter were also educated on safe swallowing precautions. Recommend that pt wear PMSV during all waking hours, with supervision from family at bedside adequate. PMSV should be removed during sleep. SLP will continue to f/u acutely.   HPI HPI: Pt is an 79 year old male who presented 10/18 for elective endovascular repair abdominal aortic aneurysm. Hospital course complicated by AKI, metabolic encephalopathy, acute hypercarbic respiratory failure, hypoxia/ RLL pneumonia, and hypotension. Treated in ICU for sepsis, AFRVR, underwent cardioversion, ABX course. SLP ordered 10/28 given pt coughing with PO liquid intake. MBS 10/29: mild oropharyngeal dysphagia characterized by reduced bolus propulsion, reduced lingual retraction, reduced pharyngeal constriction, and reduced anterior laryngeal movement. Recommended dys 3 diet with thin liquid. Pt became obtunded on 11/2 and was transferred back to ICU. Pt made NPO and Cortrak placed on 11/3, SLP recommended pt remain NPO with water protocol. Pt reintubated 11/5-11/8. CXR 11/9 consistent with previous small bilateral pleural effusions and right lung base aeration. Pt had repeat bedside swallow evaluation on 11/9 and  was asymptomatic for aspiration but demonstrated a baseline congested cough and increased work of breathing. Pt was re-intubated shortly after swallow evaluation and trach was placed on 11/10. CXR 11/16: Diffuse bilateral pulmonary infiltrates/edema with slight improvement.      SLP Plan  Continue with current plan of care       Recommendations  Diet recommendations: Dysphagia 3 (mechanical soft);Thin liquid Liquids provided via: Cup;Straw Medication Administration: Whole meds with liquid Supervision: Intermittent supervision to cue for compensatory strategies;Staff to assist with self feeding Compensations: Slow rate;Small sips/bites;Minimize environmental distractions;Follow solids with liquid;Clear throat intermittently Postural Changes and/or Swallow Maneuvers: Seated upright 90 degrees      Patient may use Passy-Muir Speech Valve: During all waking hours (remove during sleep);During PO intake/meals;Caregiver trained to provide supervision PMSV Supervision: Full MD: Please consider changing trach tube to : Cuffless;Smaller size         Oral Care Recommendations: Oral care QID Follow up Recommendations: LTACH SLP Visit Diagnosis: Dysphagia, oropharyngeal phase (R13.12) Plan: Continue with current plan of care       GO                Greggory Keen 05/23/2020, 11:25 AM

## 2020-05-23 NOTE — Progress Notes (Addendum)
Progress Note    05/23/2020 7:20 AM 27 Days Post-Op  Subjective:  Daughter at bedside; pt confused this morning.  Afebrile HR 80's-90's NSR 82'X-937'J systolic 696% TC  Vitals:   05/23/20 0600 05/23/20 0635  BP: 114/71   Pulse: 69   Resp: (!) 24   Temp:  97.6 F (36.4 C)  SpO2: 99%     Physical Exam: Cardiac:  regular Lungs:  Non labored on TC Incisions:  Midline and right groin healing nicely Extremities:  Bilateral feet are warm and well perfused.  Abdomen:  Soft, NT/ND; tolerating TF  CBC    Component Value Date/Time   WBC 7.3 05/23/2020 0030   RBC 2.95 (L) 05/23/2020 0030   HGB 9.2 (L) 05/23/2020 0030   HGB 12.1 (L) 08/20/2012 0610   HCT 29.1 (L) 05/23/2020 0030   HCT 44.0 08/02/2012 0853   PLT 371 05/23/2020 0030   PLT 158 08/20/2012 0610   MCV 98.6 05/23/2020 0030   MCV 99 08/02/2012 0853   MCH 31.2 05/23/2020 0030   MCHC 31.6 05/23/2020 0030   RDW 15.9 (H) 05/23/2020 0030   RDW 13.7 08/02/2012 0853   LYMPHSABS 0.7 05/19/2020 0056   MONOABS 0.9 05/19/2020 0056   EOSABS 0.5 05/19/2020 0056   BASOSABS 0.1 05/19/2020 0056    BMET    Component Value Date/Time   NA 139 05/23/2020 0030   NA 136 08/20/2012 0610   K 4.1 05/23/2020 0030   K 4.2 08/20/2012 0610   CL 106 05/23/2020 0030   CL 102 08/20/2012 0610   CO2 23 05/23/2020 0030   CO2 26 08/20/2012 0610   GLUCOSE 102 (H) 05/23/2020 0030   GLUCOSE 123 (H) 08/20/2012 0610   BUN 23 05/23/2020 0030   BUN 7 08/20/2012 0610   CREATININE 0.99 05/23/2020 0030   CREATININE 0.75 08/20/2012 0610   CALCIUM 8.5 (L) 05/23/2020 0030   CALCIUM 8.4 (L) 08/20/2012 0610   GFRNONAA >60 05/23/2020 0030   GFRNONAA >60 08/20/2012 0610   GFRAA >60 05/07/2017 0949   GFRAA >60 08/20/2012 0610    INR    Component Value Date/Time   INR 1.3 (H) 05/07/2020 0435   INR 1.0 08/02/2012 0853     Intake/Output Summary (Last 24 hours) at 05/23/2020 0720 Last data filed at 05/23/2020 0600 Gross per 24 hour   Intake 2087 ml  Output 2700 ml  Net -613 ml     Assessment:  79 y.o. 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  Lights in the room turned on and oriented pt.  Hopefully, this will clear through the morning.  -pt tolerating TF's -pt has been on TC since Saturday morning.  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  Pt remains afebrile and WBC is normal -hopefully we can get pt out of bed to chair today.  ? Transfer to progressive 4E - will defer to CCM.  CIR feels pt is a good candidate for CIR after he gets a little stronger and they are following.   -DVT prophylaxis:  Eliquis   Leontine Locket, PA-C [facility] [phone] 05/23/2020 7:20 AM  VASCULAR STAFF ADDENDUM: I have independently interviewed and examined the patient. I agree with  the above.  Doing well overall. Delirium still an issue, but clear for me today. Abdominal incision healthy. Left groin incision healthy. Good DS in feet. Needs aggressive therapy - PT / OT / OOB TID. Diet as tolerated. Nightly TF. Consider appetite stimulant.  Continue ABx for colovesicular fistula. Approaching CIR. Needs to be able to participate more in therapy. Delirium precautions. Normalize sleep/wake as much as possible.  Yevonne Aline. Stanford Breed, MD [facility] Phone Number: [phone] 05/23/2020 9:49 AM

## 2020-05-23 NOTE — Progress Notes (Signed)
Inpatient Rehabilitation Admissions Coordinator  I met at bedside with patient and daughter. I updated her on process for insurance approval once he tolerates more therapy. I will follow up on Friday.  Danne Baxter, RN, MSN Rehab Admissions Coordinator [phone] 05/23/2020 11:59 AM

## 2020-05-23 NOTE — Plan of Care (Signed)
  Problem: Education: Goal: Knowledge of General Education information will improve Description: Including pain rating scale, medication(s)/side effects and non-pharmacologic comfort measures Outcome: Progressing   Problem: Health Behavior/Discharge Planning: Goal: Ability to manage health-related needs will improve Outcome: Progressing   Problem: Clinical Measurements: Goal: Ability to maintain clinical measurements within normal limits will improve Outcome: Progressing Goal: Will remain free from infection Outcome: Progressing Goal: Diagnostic test results will improve Outcome: Progressing Goal: Respiratory complications will improve Outcome: Progressing Goal: Cardiovascular complication will be avoided Outcome: Progressing   Problem: Activity: Goal: Risk for activity intolerance will decrease Outcome: Progressing   Problem: Nutrition: Goal: Adequate nutrition will be maintained Outcome: Progressing   Problem: Coping: Goal: Level of anxiety will decrease Outcome: Progressing   Problem: Elimination: Goal: Will not experience complications related to bowel motility Outcome: Progressing Goal: Will not experience complications related to urinary retention Outcome: Progressing   Problem: Pain Managment: Goal: General experience of comfort will improve Outcome: Progressing   Problem: Safety: Goal: Ability to remain free from injury will improve Outcome: Progressing   Problem: Skin Integrity: Goal: Risk for impaired skin integrity will decrease Outcome: Progressing   Problem: Education: Goal: Knowledge of the prescribed therapeutic regimen will improve Outcome: Progressing   Problem: Bowel/Gastric: Goal: Gastrointestinal status for postoperative course will improve Outcome: Progressing   Problem: Clinical Measurements: Goal: Postoperative complications will be avoided or minimized Outcome: Progressing   Problem: Respiratory: Goal: Respiratory status will  improve Outcome: Progressing   Problem: Skin Integrity: Goal: Demonstration of wound healing without infection will improve Outcome: Progressing   Problem: Urinary Elimination: Goal: Ability to achieve and maintain adequate renal perfusion and functioning will improve Outcome: Progressing   Problem: Activity: Goal: Ability to tolerate increased activity will improve Outcome: Progressing   Problem: Respiratory: Goal: Ability to maintain a clear airway and adequate ventilation will improve Outcome: Progressing   Problem: Role Relationship: Goal: Method of communication will improve Outcome: Progressing   Problem: Education: Goal: Knowledge about tracheostomy care/management will improve Outcome: Progressing   Problem: Activity: Goal: Ability to tolerate increased activity will improve Outcome: Progressing   Problem: Health Behavior/Discharge Planning: Goal: Ability to manage tracheostomy will improve Outcome: Progressing   Problem: Respiratory: Goal: Patent airway maintenance will improve Outcome: Progressing   Problem: Role Relationship: Goal: Ability to communicate will improve Outcome: Progressing

## 2020-05-23 NOTE — Progress Notes (Signed)
Cortrak Team Note  RN paged Cortrak RD to inform RD that pt's tube had been dislodged. RD adjusted tube and bridled tube in place at 79cm in the L nare.  X-ray is required, abdominal x-ray has been ordered by the Cortrak team. Please confirm tube placement before using the Cortrak tube.   If the tube becomes dislodged please keep the tube and contact the Cortrak team at [email or web address] (password [email or web address]) for replacement.  If after hours and replacement cannot be delayed, place a NG tube and confirm placement with an abdominal x-ray.    Larkin Ina, MS, RD, LDN RD pager number and weekend/on-call pager number located in [place].

## 2020-05-23 NOTE — Progress Notes (Signed)
Occupational Therapy Treatment Patient Details Name: Carlos Olson MRN: [id number] DOB: 1940-10-17 Today's Date: 05/23/2020    History of present illness 79 year old male who presented 10/18 for elective endovascular repair abdominal aortic aneurysm. Hospital course complicated by AKI, metabolic encephalopathy, acute hypercarbic respiratory failure, hypoxia/ RLL pneumonia, and hypotension. Treated in ICU for sepsis, AFRVR, underwent cardioversion, ABX course. SLP ordered 10/28 given pt coughing with PO liquid intake. MBS 10/29: mild oropharyngeal dysphagia characterized by reduced bolus propulsion, reduced lingual retraction, reduced pharyngeal constriction, and reduced anterior laryngeal movement. Recommended dys 3 diet with thin liquid. Pt became obtunded on 11/2 and was transferred back to ICU. Pt made NPO and Cortrak placed on 11/3, SLP recommended pt remain NPO with water protocol. Pt reintubated 11/5-11/8. CXR 11/9 consistent with previous small bilateral pleural effusions and right lung base aeration.    OT comments  Pt up to chair with PT prior to OT's arrival. Focus of session on ADL training. Pt completed combing his hair with moderate assistance and oral care with min assist. Verbal cues needed for task persistence. Pt with baseline difficulty with LB bathing and dressing, requiring total assist to manage socks and wash feet. Pt alert and participating well. Distracted by multiple team members in room during session, but redirected easily. Continues to be appropriate for CIR. Updated goals.   Follow Up Recommendations  CIR;Supervision/Assistance - 24 hour    Equipment Recommendations  Other (comment) (defer to next venue)    Recommendations for Other Services      Precautions / Restrictions Precautions Precautions: Fall Precaution Comments: trach with PSMV at all times, flexiseal       Mobility Bed Mobility               General bed mobility comments: pt in chair    Transfers                 General transfer comment: pt up to chair with stedy and PT just prior to OT's arrival    Balance     Sitting balance-Leahy Scale: Poor Sitting balance - Comments: tends to lean R in chair, positioned with pillows Postural control: Left lateral lean                                 ADL either performed or assessed with clinical judgement   ADL Overall ADL's : Needs assistance/impaired     Grooming: Brushing hair;Wash/dry face;Oral care;Sitting;Moderate assistance       Lower Body Bathing: Total assistance;Sitting/lateral leans       Lower Body Dressing: Total assistance;Sitting/lateral leans Lower Body Dressing Details (indicate cue type and reason): don and doff socks                     Vision       Perception     Praxis      Cognition Arousal/Alertness: Awake/alert Behavior During Therapy: WFL for tasks assessed/performed Overall Cognitive Status: Impaired/Different from baseline Area of Impairment: Attention;Memory;Following commands;Safety/judgement;Awareness;Problem solving                   Current Attention Level: Sustained Memory: Decreased short-term memory Following Commands: Follows one step commands with increased time Safety/Judgement: Decreased awareness of safety;Decreased awareness of deficits   Problem Solving: Slow processing;Decreased initiation;Difficulty sequencing;Requires verbal cues General Comments: decreased task persistence, cues for thoroughness during ADL        Exercises  Shoulder Instructions       General Comments      Pertinent Vitals/ Pain       Pain Assessment: Faces Faces Pain Scale: Hurts little more Pain Location: LEs Pain Descriptors / Indicators: Aching;Grimacing;Guarding Pain Intervention(s): Monitored during session;Repositioned  Home Living                                          Prior Functioning/Environment               Frequency  Min 2X/week        Progress Toward Goals  OT Goals(current goals can now be found in the care plan section)  Progress towards OT goals: Progressing toward goals  Acute Rehab OT Goals Patient Stated Goal: to go home OT Goal Formulation: With patient Time For Goal Achievement: 06/06/20 Potential to Achieve Goals: Good  Plan Discharge plan remains appropriate    Co-evaluation                 AM-PAC OT "6 Clicks" Daily Activity     Outcome Measure   Help from another person eating meals?: A Little Help from another person taking care of personal grooming?: A Lot Help from another person toileting, which includes using toliet, bedpan, or urinal?: Total Help from another person bathing (including washing, rinsing, drying)?: A Lot Help from another person to put on and taking off regular upper body clothing?: A Lot Help from another person to put on and taking off regular lower body clothing?: Total 6 Click Score: 11    End of Session Equipment Utilized During Treatment: Oxygen (5L 28%)  OT Visit Diagnosis: Pain;Muscle weakness (generalized) (M62.81);Unsteadiness on feet (R26.81);Other abnormalities of gait and mobility (R26.89);Other symptoms and signs involving cognitive function   Activity Tolerance Patient tolerated treatment well   Patient Left in chair;with call bell/phone within reach;with family/visitor present   Nurse Communication Other (comment) (NT--to change linen)        Time: 0375-4360 OT Time Calculation (min): 30 min  Charges: OT General Charges $OT Visit: 1 Visit OT Treatments $Self Care/Home Management : 23-37 mins  Nestor Lewandowsky, OTR/L Acute Rehabilitation Services Pager: [phone] Office: [phone]   Malka So 05/23/2020, 11:20 AM

## 2020-05-23 NOTE — Progress Notes (Signed)
Physical Therapy Treatment Patient Details Name: Carlos Olson MRN: [id number] DOB: 10/05/1940 Today's Date: 05/23/2020    History of Present Illness 79-year-old male who presented 10/18 for elective endovascular repair abdominal aortic aneurysm. Hospital course complicated by AKI, metabolic encephalopathy, acute hypercarbic respiratory failure, hypoxia/ RLL pneumonia, and hypotension. Treated in ICU for sepsis, AFRVR, underwent cardioversion, ABX course. SLP ordered 10/28 given pt coughing with PO liquid intake. MBS 10/29: mild oropharyngeal dysphagia characterized by reduced bolus propulsion, reduced lingual retraction, reduced pharyngeal constriction, and reduced anterior laryngeal movement. Recommended dys 3 diet with thin liquid. Pt became obtunded on 11/2 and was transferred back to ICU. Pt made NPO and Cortrak placed on 11/3, SLP recommended pt remain NPO with water protocol. Pt reintubated 11/5-11/8. CXR 11/9 consistent with previous small bilateral pleural effusions and right lung base aeration.     PT Comments    Pt admitted with above diagnosis. Pt was able to stand to STedy and PT assist pt to chair.  Pt able to stand to STedy x 2 for about 30 seconds each attempt.  OT came in and did session after PT session.  Pt continues to progress slowly but is making clear progress. Pt met 1/5 goals. Goals revised today.  Updated D/C plan as pt is on 28% trach collar and can go to REhab if making progress.  Also updated frequency to 3x week as pt tolerating more currently.   Pt currently with functional limitations due to balance and endurance deficits. Pt will benefit from skilled PT to increase their independence and safety with mobility to allow discharge to the venue listed below.     Follow Up Recommendations  CIR;Supervision/Assistance - 24 hour     Equipment Recommendations  3in1 (PT)    Recommendations for Other Services       Precautions / Restrictions Precautions Precautions:  Fall Precaution Comments: trach, flexiseal Restrictions Weight Bearing Restrictions: No    Mobility  Bed Mobility Overal bed mobility: Needs Assistance Bed Mobility: Supine to Sit Rolling: Mod assist Sidelying to sit: +2 for physical assistance;Mod assist Supine to sit: Mod assist;+2 for physical assistance;HOB elevated     General bed mobility comments: Pt continues to need assist for LEs and elevation of trunk.   Transfers Overall transfer level: Needs assistance Equipment used:  (Eva walker) Transfers: Sit to/from Stand Sit to Stand: +2 physical assistance;From elevated surface;Mod assist Stand pivot transfers: Total assist;+2 safety/equipment       General transfer comment: Pt needs assist to power up with use of pad.  Stood for about 30 second and then sat on the STedy.  Moved pt to the recliner in Stedy.    Ambulation/Gait                 Stairs             Wheelchair Mobility    Modified Rankin (Stroke Patients Only)       Balance Overall balance assessment: Needs assistance Sitting-balance support: Feet supported;Bilateral upper extremity supported Sitting balance-Leahy Scale: Poor Sitting balance - Comments: tends to lean R in chair, positioned with pillows.  Sat EOB with min guard assist and cues Postural control: Right lateral lean Standing balance support: Bilateral upper extremity supported Standing balance-Leahy Scale: Poor Standing balance comment: reliant on hands on STedy as well as need for external support                              Cognition Arousal/Alertness: Awake/alert Behavior During Therapy: WFL for tasks assessed/performed Overall Cognitive Status: Impaired/Different from baseline Area of Impairment: Attention;Memory;Following commands;Safety/judgement;Awareness;Problem solving                   Current Attention Level: Sustained Memory: Decreased short-term memory Following Commands: Follows one step  commands with increased time Safety/Judgement: Decreased awareness of safety;Decreased awareness of deficits   Problem Solving: Slow processing;Decreased initiation;Difficulty sequencing;Requires verbal cues General Comments: decreased task persistence, cues for thoroughness during ADL      Exercises General Exercises - Lower Extremity Ankle Circles/Pumps: Both;10 reps;Seated;AAROM Quad Sets: AROM;Both;5 reps;Supine Long Arc Quad: AROM;Both;10 reps;Seated    General Comments General comments (skin integrity, edema, etc.): 28% FiO2 trach collar, Other VSS      Pertinent Vitals/Pain Pain Assessment: Faces Faces Pain Scale: Hurts little more Pain Location: LEs Pain Descriptors / Indicators: Aching;Grimacing;Guarding Pain Intervention(s): Limited activity within patient's tolerance;Monitored during session;Repositioned    Home Living                      Prior Function            PT Goals (current goals can now be found in the care plan section) Acute Rehab PT Goals Patient Stated Goal: to go home PT Goal Formulation: With patient Time For Goal Achievement: 06/06/20 Potential to Achieve Goals: Good Progress towards PT goals: Progressing toward goals    Frequency    Min 3X/week      PT Plan Discharge plan needs to be updated;Frequency needs to be updated    Co-evaluation              AM-PAC PT "6 Clicks" Mobility   Outcome Measure  Help needed turning from your back to your side while in a flat bed without using bedrails?: A Lot Help needed moving from lying on your back to sitting on the side of a flat bed without using bedrails?: A Lot Help needed moving to and from a bed to a chair (including a wheelchair)?: A Lot Help needed standing up from a chair using your arms (e.g., wheelchair or bedside chair)?: A Lot Help needed to walk in hospital room?: Total Help needed climbing 3-5 steps with a railing? : Total 6 Click Score: 10    End of Session  Equipment Utilized During Treatment: Gait belt;Oxygen (trach collar) Activity Tolerance: Patient limited by fatigue Patient left: with call bell/phone within reach;with family/visitor present;in chair;with chair alarm set Nurse Communication: Mobility status;Need for lift equipment PT Visit Diagnosis: Muscle weakness (generalized) (M62.81);Pain Pain - Right/Left: Left Pain - part of body: Shoulder     Time: 5732-2025 PT Time Calculation (min) (ACUTE ONLY): 19 min  Charges:  $Therapeutic Activity: 8-22 mins                     Dandrae Kustra W,PT Acute Rehabilitation Services Pager:  [phone]  Office:  [place] 05/23/2020, 11:46 AM

## 2020-05-23 NOTE — Progress Notes (Signed)
Pt pulled on Cortrak. Paged Cortrak RD to readjust the tube. X-ray was ordered and Agarwala MD confirmed correct tube placement with the x-ray.  Jill Poling, RN 05/23/2020

## 2020-05-23 NOTE — Progress Notes (Signed)
NAME:  Carlos Olson, MRN:  [id number], DOB:  02-Feb-1941, LOS: 104 ADMISSION DATE:  04/17/2020, CONSULTATION DATE: 10/21 REFERRING MD: Dr. Donzetta Matters, CHIEF COMPLAINT: AKI with mild hypoxia  Brief History   79 year old male who presented 10/18 for elective endovascular repair abdominal aortic aneurysm with Dr. Donzetta Matters. PCCM consulted 10/21 for assistance in medical management.  Hospital course complicated by AKI, metabolic encephalopathy, hypoxia/ RLL pneumonia, and hypotension. Treated in ICU for sepsis, AFRVR, underwent cardioversion, ABX course. Moved out of ICU. Ongoing course significant for AKI, AF, debility. Then 11/2 he became encephalopathic overnight and was transferred back to ICU. PCCM re-consulted.  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  Past Medical History  AAA History of prostate cancer Heart murmur Diverticulitis COPD CAD atherosclerosis  Significant Hospital Events   Admitted 10/18 for elective AAA repair 10/19 OR  11/15 fever while on cefazolin, changed to ceftriaxone  Consults:  Cardiology PCCM Nephrology GI  Procedures:  Endovascular AAA repair 10/19 Tracheostomy tube placement 11/10  Significant Diagnostic Tests:  04/21/2020 CT A/P > Status post abdominal aortic repair with aortobifemoral bypass graft. Some stranding is noted in the operative site likely related to postoperative change. No sizable hematoma is seen. Diverticulosis without diverticulitis. Near complete consolidation of the right lower lobe. Small left pleural effusion is seen with left basilar atelectasis. 10/25 CTH > 1. Stable.  No acute intracranial abnormality. 2. Atrophy with chronic small vessel white matter ischemic disease.  Micro Data:  Blood 10/28 > negative Sputum 10/28 > few candida Tracheal aspirate 11/12-MSSA 11/15 urine-E.Coli>>  pansensitive  Antimicrobials:  10/19 cefazolin  10/22 zosyn > 10/26 Vancomycin 10/29 > 11/2  Zosyn 10/29 > 11/3 Fluconazole 10/29 > Zyvox 11/12 Ceftriaxone 11/15 >  Interim history/subjective:  No acute events overnight Remains on ATC Tolerated PT/OT well   Objective   Blood pressure 126/78, pulse 78, temperature 97.6 F (36.4 C), temperature source Oral, resp. rate (!) 28, height 6\' 3"  (1.905 m), weight 91.8 kg, SpO2 100 %.    FiO2 (%):  [28 %] 28 %   Intake/Output Summary (Last 24 hours) at 05/23/2020 1032 Last data filed at 05/23/2020 0700 Gross per 24 hour  Intake 2282 ml  Output 2700 ml  Net -418 ml   Filed Weights   05/21/20 0600 05/22/20 0500 05/23/20 0500  Weight: 93 kg 92.7 kg 91.8 kg   Examination: General: Acute on chronically ill appearing elderly male sitting up in bedside recline on ATC in NAD HEENT: Cuffed 6 shiley trach midline, MM pink/moist, PERRL,  Neuro: Alert and oriented x3 non-focal but weak  CV: s1s2 regular rate and rhythm, no murmur, rubs, or gallops,  PULM:  Clear to ascultation, tolerating ATC well  GI: soft, bowel sounds active in all 4 quadrants, non-tender, non-distended, tolerating TF Extremities: warm/dry, no edema  Skin: no rashes or lesions  Resolved Hospital Problem list   Hypotension due to hypovolemia  RLL pneumonia Assessment & Plan:   Acute hypercarbic respiratory failure -Now chronic failure in the setting of critical illness Status post tracheostomy 11/10 COPD without acute exacerbation P: Continues to tolerated ATC  Continue bronchodilators SPO2 goal > 88 Trend CXR and ABG as needed  Encourage pulmonary hygiene    Acute metabolic and toxic encephalopathy:  -Improved P: Avoid narcotics, tolerates very poorly  Remains off precedex Encourage sleep wake cycle  Delirium precautions Mobilize as able    MSSA  pneumonia Colovesical fistula  -etiology unclear, previous history of radiation for prostate cancer and  diverticulosis. - Evaluated by GI and urology, no intervention to be made at this time\ P: Continue Ceftriaxone  Will discuss duration of antibiotics with attending today  Continue Foley     Atrial fibrillation - Will need OP imaging  follow up to monitor for amio lung toxicity ( previous diagnosis of COPD)>> Followed by Dr. Lamonte Sakai  P: Continue Amiodarone and Metoprolol Apixaban  Continuous telemetry   Anemia  P: Hgb stable at 9.2 Transfuse per protocol   History of pulmonary nodule noted 02/2020 -Plan to follow as an outpatient -Pt is due for 3 month follow up 05/2020 P: Supportive care   Recent repair of abdominal aortic aneurysm P: Vascular surgery management   Malnutrition P: Continue TF Continue SLP eval  NPO   Best practice:  Diet: Continue enteral nutrition TF Pain/Anxiety/Delirium protocol (if indicated: Xanax decreased VAP protocol (if indicated): In place DVT prophylaxis: Eliquis GI prophylaxis: PPI Glucose control: euglycemic on no coverage Mobility: PT as tolerated Code Status: Full Family Communication: Plan of care discussed with daughter at bedside, hopefull for transition to rehab soon   Johnsie Cancel, NP-C [facility] Contact / Pager information can be found on Amion  05/23/2020, 11:06 AM

## 2020-05-24 DIAGNOSIS — J9601 Acute respiratory failure with hypoxia: Secondary | ICD-10-CM | POA: Diagnosis not present

## 2020-05-24 LAB — GLUCOSE, CAPILLARY
Glucose-Capillary: 101 mg/dL — ABNORMAL HIGH (ref 70–99)
Glucose-Capillary: 102 mg/dL — ABNORMAL HIGH (ref 70–99)
Glucose-Capillary: 105 mg/dL — ABNORMAL HIGH (ref 70–99)
Glucose-Capillary: 109 mg/dL — ABNORMAL HIGH (ref 70–99)
Glucose-Capillary: 115 mg/dL — ABNORMAL HIGH (ref 70–99)
Glucose-Capillary: 125 mg/dL — ABNORMAL HIGH (ref 70–99)

## 2020-05-24 MED ORDER — MENTHOL 3 MG MT LOZG
1.0000 | LOZENGE | OROMUCOSAL | Status: DC | PRN
  Administered 2020-05-25 – 2020-05-27 (×3): 3 mg via ORAL
  Filled 2020-05-24: qty 9

## 2020-05-24 MED ORDER — CHLORHEXIDINE GLUCONATE 0.12 % MT SOLN
OROMUCOSAL | Status: AC
  Filled 2020-05-24: qty 15

## 2020-05-24 NOTE — Progress Notes (Signed)
  Progress Note    05/24/2020 3:28 PM 27 Days Post-Op  Subjective:  Wife at bedside. Clear mentation. Tolerating PM valve well.  BP (!) 120/59   Pulse 62   Temp 97.6 F (36.4 C)   Resp 17   Ht 6\' 3"  (1.905 m)   Wt 89.2 kg   SpO2 100%   BMI 24.58 kg/m    Physical Exam: Cardiac:  regular Lungs:  Non labored on TC Incisions:  Midline and right groin healing nicely Extremities:  Bilateral feet are warm and well perfused.  Abdomen:  Soft, NT/ND; tolerating TF  CBC    Component Value Date/Time   WBC 7.3 05/23/2020 0030   RBC 2.95 (L) 05/23/2020 0030   HGB 9.2 (L) 05/23/2020 0030   HGB 12.1 (L) 08/20/2012 0610   HCT 29.1 (L) 05/23/2020 0030   HCT 44.0 08/02/2012 0853   PLT 371 05/23/2020 0030   PLT 158 08/20/2012 0610   MCV 98.6 05/23/2020 0030   MCV 99 08/02/2012 0853   MCH 31.2 05/23/2020 0030   MCHC 31.6 05/23/2020 0030   RDW 15.9 (H) 05/23/2020 0030   RDW 13.7 08/02/2012 0853   LYMPHSABS 0.7 05/19/2020 0056   MONOABS 0.9 05/19/2020 0056   EOSABS 0.5 05/19/2020 0056   BASOSABS 0.1 05/19/2020 0056    BMET    Component Value Date/Time   NA 139 05/23/2020 0030   NA 136 08/20/2012 0610   K 4.1 05/23/2020 0030   K 4.2 08/20/2012 0610   CL 106 05/23/2020 0030   CL 102 08/20/2012 0610   CO2 23 05/23/2020 0030   CO2 26 08/20/2012 0610   GLUCOSE 102 (H) 05/23/2020 0030   GLUCOSE 123 (H) 08/20/2012 0610   BUN 23 05/23/2020 0030   BUN 7 08/20/2012 0610   CREATININE 0.99 05/23/2020 0030   CREATININE 0.75 08/20/2012 0610   CALCIUM 8.5 (L) 05/23/2020 0030   CALCIUM 8.4 (L) 08/20/2012 0610   GFRNONAA >60 05/23/2020 0030   GFRNONAA >60 08/20/2012 0610   GFRAA >60 05/07/2017 0949   GFRAA >60 08/20/2012 0610    INR    Component Value Date/Time   INR 1.3 (H) 05/07/2020 0435   INR 1.0 08/02/2012 0853     Intake/Output Summary (Last 24 hours) at 05/24/2020 1528 Last data filed at 05/24/2020 1100 Gross per 24 hour  Intake 1005 ml  Output 400 ml  Net 605  ml     Assessment:  79 y.o. 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 Diet as tolerated. Nightly TF. Consider appetite stimulant.  Continue ABx for colovesicular fistula. Approaching CIR. Needs to be able to participate more in therapy. Delirium precautions. Normalize sleep/wake as much as possible.  Yevonne Aline. Stanford Breed, MD [facility] Phone Number: [phone] 05/24/2020 3:28 PM

## 2020-05-24 NOTE — Progress Notes (Signed)
NAME:  Carlos Olson, MRN:  [id number], DOB:  1940/10/28, LOS: 93 ADMISSION DATE:  04/17/2020, CONSULTATION DATE: 10/21 REFERRING MD: Dr. Donzetta Matters, CHIEF COMPLAINT: AKI with mild hypoxia  Brief History   79 year old male who presented 10/18 for elective endovascular repair abdominal aortic aneurysm with Dr. Donzetta Matters. PCCM consulted 10/21 for assistance in medical management.  Hospital course complicated by AKI, metabolic encephalopathy, hypoxia/ RLL pneumonia, and hypotension. Treated in ICU for sepsis, AFRVR, underwent cardioversion, ABX course. Moved out of ICU. Ongoing course significant for AKI, AF, debility. Then 11/2 he became encephalopathic overnight and was transferred back to ICU. PCCM re-consulted.  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  Past Medical History  AAA History of prostate cancer Heart murmur Diverticulitis COPD CAD atherosclerosis  Significant Hospital Events   Admitted 10/18 for elective AAA repair 10/19 OR  11/15 fever while on cefazolin, changed to ceftriaxone  Consults:  Cardiology PCCM Nephrology GI  Procedures:  Endovascular AAA repair 10/19 Tracheostomy tube placement 11/10  Significant Diagnostic Tests:  04/21/2020 CT A/P > Status post abdominal aortic repair with aortobifemoral bypass graft. Some stranding is noted in the operative site likely related to postoperative change. No sizable hematoma is seen. Diverticulosis without diverticulitis. Near complete consolidation of the right lower lobe. Small left pleural effusion is seen with left basilar atelectasis. 10/25 CTH > 1. Stable.  No acute intracranial abnormality. 2. Atrophy with chronic small vessel white matter ischemic disease.  Micro Data:  Blood 10/28 > negative Sputum 10/28 > few candida Tracheal aspirate 11/12-MSSA 11/15 urine-E.Coli>>  pansensitive  Antimicrobials:  10/19 cefazolin  10/22 zosyn > 10/26 Vancomycin 10/29 > 11/2  Zosyn 10/29 > 11/3 Fluconazole 10/29 > Zyvox 11/12 Ceftriaxone 11/15 >  Interim history/subjective:  Remains on trach collar.  Progressing with physiotherapy.  Now able to eat but limited appetite.  Objective   Blood pressure (!) 120/59, pulse 62, temperature 97.6 F (36.4 C), resp. rate 17, height 6\' 3"  (1.905 m), weight 89.2 kg, SpO2 100 %.    FiO2 (%):  [28 %] 28 %   Intake/Output Summary (Last 24 hours) at 05/24/2020 1351 Last data filed at 05/24/2020 1100 Gross per 24 hour  Intake 1009.84 ml  Output 600 ml  Net 409.84 ml   Filed Weights   05/22/20 0500 05/23/20 0500 05/24/20 0419  Weight: 92.7 kg 91.8 kg 89.2 kg   Examination: General:elderly male sitting up in bedside recline on ATC in NAD HEENT: Cuffed 6 shiley trach midline, MM pink/moist, PERRL,  Neuro: Alert and oriented x3 non-focal but weak  CV: s1s2 regular rate and rhythm, no murmur, rubs, or gallops,  PULM:  Clear to ascultation, tolerating ATC well  GI: soft, bowel sounds active in all 4 quadrants, non-tender, non-distended, tolerating TF.  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  Should be able to transfer out of ICU to either medical ward or CIR beginning of next week. We will switch to  nocturnal feeds to stimulate daytime appetite. Continue  PT Continue apixaban and metoprolol for atrial fibrillation Completed 10-day course of ceftriaxone for UTI Continue current bronchodilator therapy  Best practice:  Diet: Continue enteral nutrition TF and oral feeds.  Pain/Anxiety/Delirium protocol (if indicated: Xanax decreased) VAP protocol (if indicated): In place DVT prophylaxis: Eliquis GI prophylaxis: No longer indicated Glucose control: euglycemic on no coverage Mobility: PT as tolerated Code Status: Full Family Communication: Plan of care discussed with daughter at bedside, hopefull for transition to rehab soon    Kipp Brood, MD [facility] ICU Physician [place]  Pager: [phone] Or Epic Secure Chat After hours: [phone].  05/24/2020, 2:10 PM      05/24/2020, 1:51 PM

## 2020-05-25 DIAGNOSIS — J9601 Acute respiratory failure with hypoxia: Secondary | ICD-10-CM | POA: Diagnosis not present

## 2020-05-25 LAB — GLUCOSE, CAPILLARY
Glucose-Capillary: 117 mg/dL — ABNORMAL HIGH (ref 70–99)
Glucose-Capillary: 98 mg/dL (ref 70–99)

## 2020-05-25 LAB — CBC
HCT: 30.6 % — ABNORMAL LOW (ref 39.0–52.0)
Hemoglobin: 9.3 g/dL — ABNORMAL LOW (ref 13.0–17.0)
MCH: 30.5 pg (ref 26.0–34.0)
MCHC: 30.4 g/dL (ref 30.0–36.0)
MCV: 100.3 fL — ABNORMAL HIGH (ref 80.0–100.0)
Platelets: 334 10*3/uL (ref 150–400)
RBC: 3.05 MIL/uL — ABNORMAL LOW (ref 4.22–5.81)
RDW: 16.7 % — ABNORMAL HIGH (ref 11.5–15.5)
WBC: 10.5 10*3/uL (ref 4.0–10.5)
nRBC: 0 % (ref 0.0–0.2)

## 2020-05-25 LAB — BASIC METABOLIC PANEL
Anion gap: 12 (ref 5–15)
BUN: 22 mg/dL (ref 8–23)
CO2: 21 mmol/L — ABNORMAL LOW (ref 22–32)
Calcium: 8.7 mg/dL — ABNORMAL LOW (ref 8.9–10.3)
Chloride: 105 mmol/L (ref 98–111)
Creatinine, Ser: 1.04 mg/dL (ref 0.61–1.24)
GFR, Estimated: >60 mL/min (ref >60)
Glucose, Bld: 103 mg/dL — ABNORMAL HIGH (ref 70–99)
Potassium: 3.8 mmol/L (ref 3.5–5.1)
Sodium: 138 mmol/L (ref 135–145)

## 2020-05-25 MED ORDER — CHLORHEXIDINE GLUCONATE 0.12 % MT SOLN
15.0000 mL | Freq: Two times a day (BID) | OROMUCOSAL | Status: DC
  Administered 2020-05-25 – 2020-05-30 (×11): 15 mL via OROMUCOSAL
  Filled 2020-05-25 (×11): qty 15

## 2020-05-25 MED ORDER — ORAL CARE MOUTH RINSE
15.0000 mL | Freq: Two times a day (BID) | OROMUCOSAL | Status: DC
  Administered 2020-05-25 – 2020-05-30 (×10): 15 mL via OROMUCOSAL

## 2020-05-25 NOTE — Progress Notes (Signed)
NAME:  Carlos Olson, MRN:  [id number], DOB:  Aug 07, 1940, LOS: 49 ADMISSION DATE:  04/17/2020, CONSULTATION DATE: 10/21 REFERRING MD: Dr. Donzetta Matters, CHIEF COMPLAINT: AKI with mild hypoxia  Brief History   79 year old male who presented 10/18 for elective endovascular repair abdominal aortic aneurysm with Dr. Donzetta Matters. PCCM consulted 10/21 for assistance in medical management.  Hospital course complicated by AKI, metabolic encephalopathy, hypoxia/ RLL pneumonia, and hypotension. Treated in ICU for sepsis, AFRVR, underwent cardioversion, ABX course. Moved out of ICU. Ongoing course significant for AKI, AF, debility. Then 11/2 he became encephalopathic overnight and was transferred back to ICU. PCCM re-consulted.  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  Past Medical History  AAA History of prostate cancer Heart murmur Diverticulitis COPD CAD atherosclerosis  Significant Hospital Events   Admitted 10/18 for elective AAA repair 10/19 OR  11/15 fever while on cefazolin, changed to ceftriaxone  Consults:  Cardiology PCCM Nephrology GI  Procedures:  Endovascular AAA repair 10/19 Tracheostomy tube placement 11/10  Significant Diagnostic Tests:  04/21/2020 CT A/P > Status post abdominal aortic repair with aortobifemoral bypass graft. Some stranding is noted in the operative site likely related to postoperative change. No sizable hematoma is seen. Diverticulosis without diverticulitis. Near complete consolidation of the right lower lobe. Small left pleural effusion is seen with left basilar atelectasis. 10/25 CTH > 1. Stable.  No acute intracranial abnormality. 2. Atrophy with chronic small vessel white matter ischemic disease.  Micro Data:  Blood 10/28 > negative Sputum 10/28 > few candida Tracheal aspirate 11/12-MSSA 11/15 urine-E.Coli>>  pansensitive  Antimicrobials:  10/19 cefazolin  10/22 zosyn > 10/26 Vancomycin 10/29 > 11/2  Zosyn 10/29 > 11/3 Fluconazole 10/29 > Zyvox 11/12 Ceftriaxone 11/15 >  Interim history/subjective:  Sitting up in bed in no acute distress No acute events overnight Wife at bedside and updated  Objective   Blood pressure (!) 109/56, pulse 76, temperature 97.6 F (36.4 C), resp. rate (!) 24, height 6\' 3"  (1.905 m), weight 87.6 kg, SpO2 92 %.    FiO2 (%):  [28 %] 28 %   Intake/Output Summary (Last 24 hours) at 05/25/2020 1017 Last data filed at 05/25/2020 0606 Gross per 24 hour  Intake 641.6 ml  Output 1300 ml  Net -658.4 ml   Filed Weights   05/23/20 0500 05/24/20 0419 05/25/20 0500  Weight: 91.8 kg 89.2 kg 87.6 kg   Examination: General: Frail deconditioned elderly male lying in bed in no acute distress. HEENT: Cuffed 6 Shiley trach midline, tolerating well, MM pink/moist, PERRL,  Neuro: Oriented x3, nonfocal CV: s1s2 regular rate and rhythm, no murmur, rubs, or gallops,  PULM: Clear to auscultation bilaterally, no increased work of breathing, no added breath sounds GI: soft, bowel sounds active in all 4 quadrants, non-tender, non-distended, tolerating nocturnal tube feeds Extremities: warm/dry, no edema  Skin: no rashes or lesions  Resolved Hospital Problem list   Hypotension due to hypovolemia  RLL pneumonia Acute metabolic and toxic encephalopathy -Avoid all narcotics MSSA pneumonia  Assessment & Plan:   Acute hypercarbic respiratory failure -Now chronic failure in the setting of critical illness Status post tracheostomy 11/10 HX COPD without acute exacerbation P: Continues to tolerate ATC well Routine trach care As needed bronchodilators SPO2 goal greater than 88 Encourage pulmonary hygiene Mobilize as able  Colovesical fistula  -etiology unclear, previous history of radiation for prostate cancer and diverticulosis. - Evaluated by  GI and urology, no  intervention to be made at this time\ P: Completed 10 days of ceftriaxone Continue Foley  Atrial fibrillation - Will need OP imaging  follow up to monitor for amio lung toxicity ( previous diagnosis of COPD)>> Followed by Dr. Lamonte Sakai  P: Continue Metroprolol Continue apixaban Continuous telemetry  Anemia  P: Hemoglobin continues to remain stable with no acute signs of bleeding Transfuse per protocol follow  Intermittently trend CBC  History of pulmonary nodule noted 02/2020 -Plan to follow as an outpatient -Pt is due for 3 month follow up 05/2020 P: Supportive care  Recent repair of abdominal aortic aneurysm P: Management per vascular surgery  Malnutrition P: Transition to nocturnal feeds to allow for daytime oral intake Continue SLP eval Dysphagia 3 diet  Best practice:  Diet: Continue enteral nutrition TF and oral feeds.  Pain/Anxiety/Delirium protocol (if indicated: Xanax decreased) VAP protocol (if indicated): In place DVT prophylaxis: Eliquis GI prophylaxis: No longer indicated Glucose control: euglycemic on no coverage Mobility: PT as tolerated Code Status: Full Family Communication: Patient spouse updated regarding plan of care morning of 11/26  Johnsie Cancel, NP-C  Pulmonary & Critical Care Contact / Pager information can be found on Amion  05/25/2020, 10:28 AM

## 2020-05-25 NOTE — Progress Notes (Signed)
  Progress Note    05/25/2020 7:31 AM 27 Days Post-Op  Subjective:  Wife at bedside. Clear mentation. Tolerating PM valve well.  BP 118/63   Pulse 72   Temp 98.5 F (36.9 C) (Axillary)   Resp (!) 27   Ht 6\' 3"  (1.905 m)   Wt 87.6 kg   SpO2 91%   BMI 24.14 kg/m    Physical Exam: Cardiac:  regular Lungs:  Non labored on TC Incisions:  Midline and right groin healing nicely Extremities:  Bilateral feet are warm and well perfused.  Abdomen:  Soft, NT/ND; tolerating TF  CBC    Component Value Date/Time   WBC 10.5 05/25/2020 0053   RBC 3.05 (L) 05/25/2020 0053   HGB 9.3 (L) 05/25/2020 0053   HGB 12.1 (L) 08/20/2012 0610   HCT 30.6 (L) 05/25/2020 0053   HCT 44.0 08/02/2012 0853   PLT 334 05/25/2020 0053   PLT 158 08/20/2012 0610   MCV 100.3 (H) 05/25/2020 0053   MCV 99 08/02/2012 0853   MCH 30.5 05/25/2020 0053   MCHC 30.4 05/25/2020 0053   RDW 16.7 (H) 05/25/2020 0053   RDW 13.7 08/02/2012 0853   LYMPHSABS 0.7 05/19/2020 0056   MONOABS 0.9 05/19/2020 0056   EOSABS 0.5 05/19/2020 0056   BASOSABS 0.1 05/19/2020 0056    BMET    Component Value Date/Time   NA 138 05/25/2020 0053   NA 136 08/20/2012 0610   K 3.8 05/25/2020 0053   K 4.2 08/20/2012 0610   CL 105 05/25/2020 0053   CL 102 08/20/2012 0610   CO2 21 (L) 05/25/2020 0053   CO2 26 08/20/2012 0610   GLUCOSE 103 (H) 05/25/2020 0053   GLUCOSE 123 (H) 08/20/2012 0610   BUN 22 05/25/2020 0053   BUN 7 08/20/2012 0610   CREATININE 1.04 05/25/2020 0053   CREATININE 0.75 08/20/2012 0610   CALCIUM 8.7 (L) 05/25/2020 0053   CALCIUM 8.4 (L) 08/20/2012 0610   GFRNONAA >60 05/25/2020 0053   GFRNONAA >60 08/20/2012 0610   GFRAA >60 05/07/2017 0949   GFRAA >60 08/20/2012 0610    INR    Component Value Date/Time   INR 1.3 (H) 05/07/2020 0435   INR 1.0 08/02/2012 0853     Intake/Output Summary (Last 24 hours) at 05/25/2020 0731 Last data filed at 05/25/2020 0606 Gross per 24 hour  Intake 641.6 ml   Output 1600 ml  Net -958.4 ml     Assessment:  79 y.o. 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 Diet as tolerated. Nightly TF. Consider appetite stimulant.  Continue ABx for colovesicular fistula. Approaching CIR. Needs to be able to participate more in therapy. Delirium precautions. Normalize sleep/wake as much as possible.  Yevonne Aline. Stanford Breed, MD [facility] Phone Number: [phone] 05/25/2020 7:31 AM

## 2020-05-25 NOTE — Progress Notes (Signed)
Inpatient Rehabilitation Admissions Coordinator  I will follow up on Monday with patient's progress.  Danne Baxter, RN, MSN Rehab Admissions Coordinator [phone] 05/25/2020 12:54 PM

## 2020-05-26 DIAGNOSIS — J9601 Acute respiratory failure with hypoxia: Secondary | ICD-10-CM | POA: Diagnosis not present

## 2020-05-26 LAB — GLUCOSE, CAPILLARY
Glucose-Capillary: 98 mg/dL (ref 70–99)
Glucose-Capillary: 110 mg/dL — ABNORMAL HIGH (ref 70–99)
Glucose-Capillary: 103 mg/dL — ABNORMAL HIGH (ref 70–99)

## 2020-05-26 MED ORDER — LIDOCAINE 5 % EX PTCH
1.0000 | MEDICATED_PATCH | CUTANEOUS | Status: DC
  Filled 2020-05-26: qty 1

## 2020-05-26 MED ORDER — QUETIAPINE FUMARATE 25 MG PO TABS
25.0000 mg | ORAL_TABLET | Freq: Every day | ORAL | Status: DC
  Administered 2020-05-26 – 2020-05-29 (×4): 25 mg via ORAL
  Filled 2020-05-26 (×4): qty 1

## 2020-05-26 NOTE — Procedures (Signed)
Tracheostomy Change Note  Patient Details:   Name: Carlos Olson DOB: May 03, 1941 MRN: [id number]    Airway Documentation:     Evaluation  O2 sats: stable throughout Complications: No apparent complications Patient did tolerate procedure well. Bilateral Breath Sounds: Clear, Diminished   Pt was informed of procedure taking place. RT x2 and RN were present. RT placed #6 shiley cuffless with good color change by CO2 detector. Pt tolerated well with SVS. RT will continue to monitor pt.    Jorje Guild 05/26/2020, 4:22 PM

## 2020-05-26 NOTE — Progress Notes (Signed)
NAME:  Carlos Olson, MRN:  [id number], DOB:  05/02/1941, LOS: 95 ADMISSION DATE:  04/17/2020, CONSULTATION DATE: 10/21 REFERRING MD: Dr. Donzetta Matters, CHIEF COMPLAINT: AKI with mild hypoxia  Brief History   79 year old male who presented 10/18 for elective endovascular repair abdominal aortic aneurysm with Dr. Donzetta Matters. PCCM consulted 10/21 for assistance in medical management.  Hospital course complicated by AKI, metabolic encephalopathy, hypoxia/ RLL pneumonia, and hypotension. Treated in ICU for sepsis, AFRVR, underwent cardioversion, ABX course. Moved out of ICU. Ongoing course significant for AKI, AF, debility. Then 11/2 he became encephalopathic overnight and was transferred back to ICU. PCCM re-consulted.  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  Past Medical History  AAA History of prostate cancer Heart murmur Diverticulitis COPD CAD atherosclerosis  Significant Hospital Events   Admitted 10/18 for elective AAA repair 10/19 OR  11/15 fever while on cefazolin, changed to ceftriaxone  Consults:  Cardiology PCCM Nephrology GI  Procedures:  Endovascular AAA repair 10/19 Tracheostomy tube placement 11/10  Significant Diagnostic Tests:  04/21/2020 CT A/P > Status post abdominal aortic repair with aortobifemoral bypass graft. Some stranding is noted in the operative site likely related to postoperative change. No sizable hematoma is seen. Diverticulosis without diverticulitis. Near complete consolidation of the right lower lobe. Small left pleural effusion is seen with left basilar atelectasis. 10/25 CTH > 1. Stable.  No acute intracranial abnormality. 2. Atrophy with chronic small vessel white matter ischemic disease.  Micro Data:  Blood 10/28 > negative Sputum 10/28 > few candida Tracheal aspirate 11/12-MSSA 11/15 urine-E.Coli>>  pansensitive  Antimicrobials:  10/19 cefazolin  10/22 zosyn > 10/26 Vancomycin 10/29 > 11/2  Zosyn 10/29 > 11/3 Fluconazole 10/29 > Zyvox 11/12 Ceftriaxone 11/15 >  Interim history/subjective:   On nocturnal tube feeds only.  Able to eat but does not find hospital food appetizing.  Objective   Blood pressure 122/72, pulse 67, temperature 98.2 F (36.8 C), temperature source Axillary, resp. rate (!) 22, height 6\' 3"  (1.905 m), weight 87.6 kg, SpO2 100 %.    FiO2 (%):  [28 %] 28 %   Intake/Output Summary (Last 24 hours) at 05/26/2020 1516 Last data filed at 05/26/2020 0800 Gross per 24 hour  Intake 1275 ml  Output 1500 ml  Net -225 ml   Filed Weights   05/24/20 0419 05/25/20 0500 05/26/20 0500  Weight: 89.2 kg 87.6 kg 87.6 kg   Examination: General: Frail deconditioned elderly male lying in bed in no acute distress. HEENT: Cuffed 6 Shiley trach midline, tolerating well, MM pink/moist, PERRL,  Neuro: Oriented x3, nonfocal CV: s1s2 regular rate and rhythm, no murmur, rubs, or gallops,  PULM: Clear to auscultation bilaterally, no increased work of breathing, no added breath sounds.  Signs of hyperinflation GI: soft, bowel sounds active in all 4 quadrants, non-tender, non-distended, tolerating nocturnal tube feeds Extremities: warm/dry, no edema  Skin: no rashes or lesions  Resolved Hospital Problem list   Hypotension due to hypovolemia  RLL pneumonia Acute metabolic and toxic encephalopathy -Avoid all narcotics MSSA pneumonia  Assessment & Plan:   Acute hypercarbic respiratory failure -Now chronic failure in the setting of critical illness Status post tracheostomy 11/10 HX COPD without acute exacerbation P: Continues to tolerate ATC well-we will downsize to cuffless #6 Shiley Routine trach care As needed bronchodilators SPO2 goal greater than 88 Encourage pulmonary hygiene Mobilize as able  Colovesical fistula  -etiology unclear,  previous history of  radiation for prostate cancer and diverticulosis. - Evaluated by GI and urology, no intervention to be made at this time\ P: Completed 10 days of ceftriaxone Continue Foley  Atrial fibrillation - Will need OP imaging  follow up to monitor for amio lung toxicity ( previous diagnosis of COPD)>> Followed by Dr. Lamonte Sakai  P: Continue Metroprolol Continue apixaban Continuous telemetry  Anemia  P: Hemoglobin continues to remain stable with no acute signs of bleeding Transfuse per protocol follow  Intermittently trend CBC  History of pulmonary nodule noted 02/2020 -Plan to follow as an outpatient -Pt is due for 3 month follow up 05/2020 P: Supportive care  Recent repair of abdominal aortic aneurysm P: Management per vascular surgery  Malnutrition P: Transition to nocturnal feeds to allow for daytime oral intake Continue SLP eval Dysphagia 3 diet  Best practice:  Diet: Continue enteral nutrition TF and oral feeds.  Pain/Anxiety/Delirium protocol (if indicated: Xanax decreased) VAP protocol (if indicated): In place DVT prophylaxis: Eliquis GI prophylaxis: No longer indicated Glucose control: euglycemic on no coverage Mobility: PT as tolerated Code Status: Full Family Communication: Patient spouse updated regarding plan of care morning of 11/26  Kipp Brood, MD [facility] ICU Physician [place]  Pager: [phone] Or Epic Secure Chat After hours: [phone].  05/26/2020, 3:17 PM      05/26/2020, 3:16 PM

## 2020-05-26 NOTE — Progress Notes (Signed)
  Progress Note    05/26/2020 10:31 AM 27 Days Post-Op  Subjective:  Daughter at bedside. Clear mentation. Tolerating PM valve well.  BP 114/74   Pulse 80   Temp 99.6 F (37.6 C) (Axillary)   Resp (!) 21   Ht 6\' 3"  (1.905 m)   Wt 87.6 kg   SpO2 100%   BMI 24.14 kg/m    Physical Exam: Cardiac:  regular Lungs:  Non labored on TC Incisions:  Midline and right groin healing nicely Extremities:  Bilateral feet are warm and well perfused.  Abdomen:  Soft, NT/ND; tolerating TF  CBC    Component Value Date/Time   WBC 10.5 05/25/2020 0053   RBC 3.05 (L) 05/25/2020 0053   HGB 9.3 (L) 05/25/2020 0053   HGB 12.1 (L) 08/20/2012 0610   HCT 30.6 (L) 05/25/2020 0053   HCT 44.0 08/02/2012 0853   PLT 334 05/25/2020 0053   PLT 158 08/20/2012 0610   MCV 100.3 (H) 05/25/2020 0053   MCV 99 08/02/2012 0853   MCH 30.5 05/25/2020 0053   MCHC 30.4 05/25/2020 0053   RDW 16.7 (H) 05/25/2020 0053   RDW 13.7 08/02/2012 0853   LYMPHSABS 0.7 05/19/2020 0056   MONOABS 0.9 05/19/2020 0056   EOSABS 0.5 05/19/2020 0056   BASOSABS 0.1 05/19/2020 0056    BMET    Component Value Date/Time   NA 138 05/25/2020 0053   NA 136 08/20/2012 0610   K 3.8 05/25/2020 0053   K 4.2 08/20/2012 0610   CL 105 05/25/2020 0053   CL 102 08/20/2012 0610   CO2 21 (L) 05/25/2020 0053   CO2 26 08/20/2012 0610   GLUCOSE 103 (H) 05/25/2020 0053   GLUCOSE 123 (H) 08/20/2012 0610   BUN 22 05/25/2020 0053   BUN 7 08/20/2012 0610   CREATININE 1.04 05/25/2020 0053   CREATININE 0.75 08/20/2012 0610   CALCIUM 8.7 (L) 05/25/2020 0053   CALCIUM 8.4 (L) 08/20/2012 0610   GFRNONAA >60 05/25/2020 0053   GFRNONAA >60 08/20/2012 0610   GFRAA >60 05/07/2017 0949   GFRAA >60 08/20/2012 0610    INR    Component Value Date/Time   INR 1.3 (H) 05/07/2020 0435   INR 1.0 08/02/2012 0853     Intake/Output Summary (Last 24 hours) at 05/26/2020 1031 Last data filed at 05/26/2020 0800 Gross per 24 hour  Intake 1557 ml   Output 1500 ml  Net 57 ml     Assessment:  79 y.o. 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 Diet as tolerated. Nightly TF.  Continue ABx for colovesicular fistula. Approaching CIR. Needs to be able to participate more in therapy. Delirium precautions. Normalize sleep/wake as much as possible.  Hopefully will be ready for CIR early next week.  Yevonne Aline. Stanford Breed, MD [facility] Phone Number: [phone] 05/26/2020 10:31 AM

## 2020-05-27 DIAGNOSIS — J9601 Acute respiratory failure with hypoxia: Secondary | ICD-10-CM | POA: Diagnosis not present

## 2020-05-27 LAB — BASIC METABOLIC PANEL
Anion gap: 12 (ref 5–15)
BUN: 26 mg/dL — ABNORMAL HIGH (ref 8–23)
CO2: 21 mmol/L — ABNORMAL LOW (ref 22–32)
Calcium: 9 mg/dL (ref 8.9–10.3)
Chloride: 105 mmol/L (ref 98–111)
Creatinine, Ser: 1.06 mg/dL (ref 0.61–1.24)
GFR, Estimated: >60 mL/min (ref >60)
Glucose, Bld: 127 mg/dL — ABNORMAL HIGH (ref 70–99)
Potassium: 3.9 mmol/L (ref 3.5–5.1)
Sodium: 138 mmol/L (ref 135–145)

## 2020-05-27 LAB — CBC
HCT: 31.4 % — ABNORMAL LOW (ref 39.0–52.0)
Hemoglobin: 9.6 g/dL — ABNORMAL LOW (ref 13.0–17.0)
MCH: 30.5 pg (ref 26.0–34.0)
MCHC: 30.6 g/dL (ref 30.0–36.0)
MCV: 99.7 fL (ref 80.0–100.0)
Platelets: 294 10*3/uL (ref 150–400)
RBC: 3.15 MIL/uL — ABNORMAL LOW (ref 4.22–5.81)
RDW: 16.7 % — ABNORMAL HIGH (ref 11.5–15.5)
WBC: 6.5 10*3/uL (ref 4.0–10.5)
nRBC: 0 % (ref 0.0–0.2)

## 2020-05-27 MED ORDER — LIDOCAINE 5 % EX PTCH
1.0000 | MEDICATED_PATCH | CUTANEOUS | Status: DC
  Administered 2020-05-27 – 2020-05-30 (×4): 1 via TRANSDERMAL
  Filled 2020-05-27 (×5): qty 1

## 2020-05-27 MED ORDER — VITAL 1.5 CAL PO LIQD
1000.0000 mL | ORAL | Status: DC
  Administered 2020-05-27 – 2020-05-29 (×3): 1000 mL
  Filled 2020-05-27 (×2): qty 1000

## 2020-05-27 NOTE — Progress Notes (Signed)
NAME:  Carlos Olson, MRN:  [id number], DOB:  20-Jan-1941, LOS: 48 ADMISSION DATE:  04/17/2020, CONSULTATION DATE: 10/21 REFERRING MD: Dr. Donzetta Matters, CHIEF COMPLAINT: AKI with mild hypoxia  Brief History   79 year old male who presented 10/18 for elective endovascular repair abdominal aortic aneurysm with Dr. Donzetta Matters. PCCM consulted 10/21 for assistance in medical management.  Hospital course complicated by AKI, metabolic encephalopathy, hypoxia/ RLL pneumonia, and hypotension. Treated in ICU for sepsis, AFRVR, underwent cardioversion, ABX course. Moved out of ICU. Ongoing course significant for AKI, AF, debility. Then 11/2 he became encephalopathic overnight and was transferred back to ICU. PCCM re-consulted.  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  Past Medical History  AAA History of prostate cancer Heart murmur Diverticulitis COPD CAD atherosclerosis  Significant Hospital Events   Admitted 10/18 for elective AAA repair 10/19 OR  11/15 fever while on cefazolin, changed to ceftriaxone  Consults:  Cardiology PCCM Nephrology GI  Procedures:  Endovascular AAA repair 10/19 Tracheostomy tube placement 11/10  Significant Diagnostic Tests:  04/21/2020 CT A/P > Status post abdominal aortic repair with aortobifemoral bypass graft. Some stranding is noted in the operative site likely related to postoperative change. No sizable hematoma is seen. Diverticulosis without diverticulitis. Near complete consolidation of the right lower lobe. Small left pleural effusion is seen with left basilar atelectasis. 10/25 CTH > 1. Stable.  No acute intracranial abnormality. 2. Atrophy with chronic small vessel white matter ischemic disease.  Micro Data:  Blood 10/28 > negative Sputum 10/28 > few candida Tracheal aspirate 11/12-MSSA 11/15 urine-E.Coli>>  pansensitive  Antimicrobials:  10/19 cefazolin  10/22 zosyn > 10/26 Vancomycin 10/29 > 11/2  Zosyn 10/29 > 11/3 Fluconazole 10/29 > Zyvox 11/12 Ceftriaxone 11/15 >  Interim history/subjective:   On nocturnal tube feeds only.  Able to eat but does not find hospital food appetizing. Episode of sundowning last night resolved with 1 dose of Seroquel.  Then slept very well.  Feels refreshed this morning.  Objective   Blood pressure 112/67, pulse 69, temperature 97.7 F (36.5 C), resp. rate 20, height 6\' 3"  (1.905 m), weight 87.7 kg, SpO2 97 %.    FiO2 (%):  [21 %-35 %] 35 %   Intake/Output Summary (Last 24 hours) at 05/27/2020 1435 Last data filed at 05/27/2020 1200 Gross per 24 hour  Intake 1037.5 ml  Output 1000 ml  Net 37.5 ml   Filed Weights   05/25/20 0500 05/26/20 0500 05/27/20 0500  Weight: 87.6 kg 87.6 kg 87.7 kg   Examination: General: Frail deconditioned elderly male up in chair in no acute distress. HEENT: Uncuffed 6 Shiley trach midline, tolerating well, MM pink/moist, PERRL,  Neuro: Oriented x3, nonfocal CV: s1s2 regular rate and rhythm, no murmur, rubs, or gallops,  PULM: Clear to auscultation bilaterally, no increased work of breathing, no added breath sounds.  Signs of hyperinflation GI: soft, bowel sounds active in all 4 quadrants, non-tender, non-distended, tolerating nocturnal tube feeds Extremities: warm/dry, no edema  Skin: no rashes or lesions  Resolved Hospital Problem list   Hypotension due to hypovolemia  RLL pneumonia Acute metabolic and toxic encephalopathy -Avoid all narcotics MSSA pneumonia  Assessment & Plan:   Acute hypercarbic respiratory failure -Now chronic failure in the setting of critical illness Status post tracheostomy 11/10 HX COPD without acute exacerbation P: Continues to tolerate ATC well-we will downsize to cuffless #6 Shiley Routine trach care As needed  bronchodilators SPO2 goal greater than 88 Encourage pulmonary  hygiene Mobilize as able  Periodic delirium with sundowning -Continue Seroquel.  Colovesical fistula  -etiology unclear, previous history of radiation for prostate cancer and diverticulosis. - Evaluated by GI and urology, no intervention to be made at this time\ P: Completed 10 days of ceftriaxone Continue Foley  Atrial fibrillation - Will need OP imaging  follow up to monitor for amio lung toxicity ( previous diagnosis of COPD)>> Followed by Dr. Lamonte Sakai  P: Continue Metroprolol Continue apixaban Continuous telemetry  Anemia  P: Hemoglobin continues to remain stable with no acute signs of bleeding Transfuse per protocol follow  Intermittently trend CBC  History of pulmonary nodule noted 02/2020 -Plan to follow as an outpatient -Pt is due for 3 month follow up 05/2020 P: Supportive care  Recent repair of abdominal aortic aneurysm P: Management per vascular surgery  Malnutrition P: Transition to nocturnal feeds to allow for daytime oral intake Continue SLP eval Dysphagia 3 diet  Best practice:  Diet: Continue enteral nutrition TF and oral feeds.  Pain/Anxiety/Delirium protocol (if indicated: Xanax decreased) VAP protocol (if indicated): In place DVT prophylaxis: Eliquis GI prophylaxis: No longer indicated Glucose control: euglycemic on no coverage Mobility: PT as tolerated Code Status: Full Family Communication: Patient daughter updated regarding plan of care morning of 11/28 Disposition: Should be ready to transfer to CIR on 11/29.  Kipp Brood, MD [facility] ICU Physician [place]  Pager: [phone] Or Epic Secure Chat After hours: [phone].  05/27/2020, 2:35 PM

## 2020-05-27 NOTE — Progress Notes (Signed)
RN called RT stating pt's SpO2 was decreasing while sleeping and she increased his liter flow to 10L and 35%. RT at bedside to assess pt. Pt SpO2 is now 96% at 8L and 35%.

## 2020-05-27 NOTE — Progress Notes (Signed)
°  Progress Note    05/27/2020 8:37 AM 27 Days Post-Op  Subjective:  Daughter at bedside. Clear mentation. Tolerating PM valve well. Up to chair today!  BP (!) 104/53    Pulse 97    Temp 98.7 F (37.1 C)    Resp (!) 32    Ht 6\' 3"  (1.905 m)    Wt 87.7 kg    SpO2 93%    BMI 24.17 kg/m    Physical Exam: Cardiac:  regular Lungs:  Non labored on TC Incisions:  Midline and right groin healing nicely Extremities:  Bilateral feet are warm and well perfused.  Abdomen:  Soft, NT/ND; tolerating TF  CBC    Component Value Date/Time   WBC 6.5 05/27/2020 0121   RBC 3.15 (L) 05/27/2020 0121   HGB 9.6 (L) 05/27/2020 0121   HGB 12.1 (L) 08/20/2012 0610   HCT 31.4 (L) 05/27/2020 0121   HCT 44.0 08/02/2012 0853   PLT 294 05/27/2020 0121   PLT 158 08/20/2012 0610   MCV 99.7 05/27/2020 0121   MCV 99 08/02/2012 0853   MCH 30.5 05/27/2020 0121   MCHC 30.6 05/27/2020 0121   RDW 16.7 (H) 05/27/2020 0121   RDW 13.7 08/02/2012 0853   LYMPHSABS 0.7 05/19/2020 0056   MONOABS 0.9 05/19/2020 0056   EOSABS 0.5 05/19/2020 0056   BASOSABS 0.1 05/19/2020 0056    BMET    Component Value Date/Time   NA 138 05/27/2020 0121   NA 136 08/20/2012 0610   K 3.9 05/27/2020 0121   K 4.2 08/20/2012 0610   CL 105 05/27/2020 0121   CL 102 08/20/2012 0610   CO2 21 (L) 05/27/2020 0121   CO2 26 08/20/2012 0610   GLUCOSE 127 (H) 05/27/2020 0121   GLUCOSE 123 (H) 08/20/2012 0610   BUN 26 (H) 05/27/2020 0121   BUN 7 08/20/2012 0610   CREATININE 1.06 05/27/2020 0121   CREATININE 0.75 08/20/2012 0610   CALCIUM 9.0 05/27/2020 0121   CALCIUM 8.4 (L) 08/20/2012 0610   GFRNONAA >60 05/27/2020 0121   GFRNONAA >60 08/20/2012 0610   GFRAA >60 05/07/2017 0949   GFRAA >60 08/20/2012 0610    INR    Component Value Date/Time   INR 1.3 (H) 05/07/2020 0435   INR 1.0 08/02/2012 0853     Intake/Output Summary (Last 24 hours) at 05/27/2020 0837 Last data filed at 05/27/2020 0600 Gross per 24 hour  Intake  677.5 ml  Output 1000 ml  Net -322.5 ml     Assessment:  79 y.o. 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 Diet as tolerated. Nightly TF. Consider appetite stimulant.  Continue ABx for colovesicular fistula. Approaching CIR. Needs to be able to participate more in therapy. Delirium precautions. Normalize sleep/wake as much as possible.  Yevonne Aline. Stanford Breed, MD [facility] Phone Number: [phone] 05/27/2020 8:37 AM

## 2020-05-28 DIAGNOSIS — J9601 Acute respiratory failure with hypoxia: Secondary | ICD-10-CM | POA: Diagnosis not present

## 2020-05-28 DIAGNOSIS — J9602 Acute respiratory failure with hypercapnia: Secondary | ICD-10-CM | POA: Diagnosis not present

## 2020-05-28 DIAGNOSIS — Z930 Tracheostomy status: Secondary | ICD-10-CM | POA: Diagnosis not present

## 2020-05-28 DIAGNOSIS — R5381 Other malaise: Secondary | ICD-10-CM

## 2020-05-28 NOTE — Progress Notes (Signed)
Physical Therapy Treatment Patient Details Name: Carlos Olson MRN: [id number] DOB: [birth year] Today's Date: 05/28/2020    History of Present Illness 79 year old male who presented 10/18 for elective endovascular repair abdominal aortic aneurysm. Hospital course complicated by AKI, metabolic encephalopathy, acute hypercarbic respiratory failure, hypoxia/ RLL pneumonia, and hypotension. Treated in ICU for sepsis, AFRVR, underwent cardioversion, ABX course. SLP ordered 10/28 given pt coughing with PO liquid intake. MBS 10/29: mild oropharyngeal dysphagia characterized by reduced bolus propulsion, reduced lingual retraction, reduced pharyngeal constriction, and reduced anterior laryngeal movement. Recommended dys 3 diet with thin liquid. Pt became obtunded on 11/2 and was transferred back to ICU. Pt made NPO and Cortrak placed on 11/3, SLP recommended pt remain NPO with water protocol. Pt reintubated 11/5-11/8. CXR 11/9 consistent with previous small bilateral pleural effusions and right lung base aeration.     PT Comments    Pt asleep on entry after sitting up 5 hours this morning. Pt easily roused and agreeable to therapy. Pt is making good progress towards his goals today. Pt is mod Ax2 for bed mobility and maxAx2 for coming into standing in [place]. Once up in standing pt able to stand for 2 minutes with min guard assist. Pt with 2/4 DoE but maintains SaO2 >90%O2 on RA. After seated rest break pt able to come to standing with modAx2 and once in standing worked on [place] and R and then able to lift first L foot and the R foot x2. Pt request return to bed to finish nap however agreeable to getting up to dinner. Pt is motivated to return to PLOF and has good support from family and friends. PT continues to recommend CIR rehab to progress mobility prior to going home. PT will continue to follow acutely.     Follow Up Recommendations  CIR;Supervision/Assistance - 24 hour     Equipment  Recommendations  3in1 (PT)       Precautions / Restrictions Precautions Precautions: Fall Precaution Comments: trach, flexiseal Restrictions Weight Bearing Restrictions: No    Mobility  Bed Mobility Overal bed mobility: Needs Assistance Bed Mobility: Supine to Sit     Supine to sit: Mod assist;+2 for physical assistance;HOB elevated Sit to supine: Mod assist   General bed mobility comments: modAx 2 for LE management off of bed and trunk to upright, pt requires modA for pad scoot of hips to EoB  Transfers Overall transfer level: Needs assistance   Transfers: Sit to/from Stand Sit to Stand: +2 physical assistance;From elevated surface;Max assist;Mod assist         General transfer comment: pt is maxA for power up to standing from EoB utilizing the [place]. Pt is modAx2 for power up from higher Stedy pads for second bout of standing          Balance Overall balance assessment: Needs assistance Sitting-balance support: Feet supported;Bilateral upper extremity supported Sitting balance-Leahy Scale: Fair Sitting balance - Comments: able to maintain balance   Standing balance support: Bilateral upper extremity supported Standing balance-Leahy Scale: Poor Standing balance comment: reliant on hands on STedy as well as need for external support                            Cognition Arousal/Alertness: Awake/alert Behavior During Therapy: WFL for tasks assessed/performed Overall Cognitive Status: Impaired/Different from baseline Area of Impairment: Attention;Memory;Following commands;Safety/judgement;Awareness;Problem solving  Current Attention Level: Sustained Memory: Decreased short-term memory Following Commands: Follows one step commands with increased time Safety/Judgement: Decreased awareness of safety;Decreased awareness of deficits   Problem Solving: Slow processing;Decreased initiation;Difficulty sequencing;Requires verbal cues            General Comments General comments (skin integrity, edema, etc.): Pt maintains SaO2 >90%O2 on RA with PSMV on trach,       Pertinent Vitals/Pain Pain Assessment: No/denies pain           PT Goals (current goals can now be found in the care plan section) Acute Rehab PT Goals Patient Stated Goal: to go home PT Goal Formulation: With patient Time For Goal Achievement: 06/06/20 Potential to Achieve Goals: Good Progress towards PT goals: Progressing toward goals    Frequency    Min 3X/week      PT Plan Current plan remains appropriate       AM-PAC PT "6 Clicks" Mobility   Outcome Measure  Help needed turning from your back to your side while in a flat bed without using bedrails?: A Lot Help needed moving from lying on your back to sitting on the side of a flat bed without using bedrails?: A Lot Help needed moving to and from a bed to a chair (including a wheelchair)?: A Lot Help needed standing up from a chair using your arms (e.g., wheelchair or bedside chair)?: Total Help needed to walk in hospital room?: Total Help needed climbing 3-5 steps with a railing? : Total 6 Click Score: 9    End of Session Equipment Utilized During Treatment: Gait belt Activity Tolerance: Patient tolerated treatment well Patient left: with call bell/phone within reach;with family/visitor present;with bed alarm set;in bed Nurse Communication: Mobility status;Need for lift equipment PT Visit Diagnosis: Muscle weakness (generalized) (M62.81);Pain Pain - Right/Left: Left Pain - part of body: Shoulder     Time: 0459-9774 PT Time Calculation (min) (ACUTE ONLY): 26 min  Charges:  $Therapeutic Exercise: 8-22 mins $Therapeutic Activity: 8-22 mins                     Joclynn Lumb B. Migdalia Dk PT, DPT Acute Rehabilitation Services Pager [phone] Office [phone]    [place] 05/28/2020, 3:02 PM

## 2020-05-28 NOTE — Progress Notes (Signed)
  Speech Language Pathology Treatment: Dysphagia;Passy Muir Speaking valve  Patient Details Name: Carlos Olson MRN: [id number] DOB: [birth year] Today's Date: 05/28/2020 Time: 1000-1008 SLP Time Calculation (min) (ACUTE ONLY): 8 min  Assessment / Plan / Recommendation Clinical Impression  Pt seen with SIL and RN at bedside. Reiterated PMSV precautions, pt now with cuffless trach, can wear PMSV all waking hours with intermittent supervision, but needs to remove PMSV for deep sleep. Confirmed this with RN given that pt reports sleeping with it on. Pt is tolerating his diet well, just now regaining some appetite. Wants to know when cortrak will be out Encouraged pt to eat his meals well today to demonstrate that he no longer needs the tube. Also asked RN to try some whole pills. Will f/u later this week and consider upgrade to regular textures if pt desires it (he does not at this time).   HPI HPI: Pt is an 79 year old male who presented 10/18 for elective endovascular repair abdominal aortic aneurysm. Hospital course complicated by AKI, metabolic encephalopathy, acute hypercarbic respiratory failure, hypoxia/ RLL pneumonia, and hypotension. Treated in ICU for sepsis, AFRVR, underwent cardioversion, ABX course. SLP ordered 10/28 given pt coughing with PO liquid intake. MBS 10/29: mild oropharyngeal dysphagia characterized by reduced bolus propulsion, reduced lingual retraction, reduced pharyngeal constriction, and reduced anterior laryngeal movement. Recommended dys 3 diet with thin liquid. Pt became obtunded on 11/2 and was transferred back to ICU. Pt made NPO and Cortrak placed on 11/3, SLP recommended pt remain NPO with water protocol. Pt reintubated 11/5-11/8. CXR 11/9 consistent with previous small bilateral pleural effusions and right lung base aeration. Pt had repeat bedside swallow evaluation on 11/9 and was asymptomatic for aspiration but demonstrated a baseline congested cough and increased work of  breathing. Pt was re-intubated shortly after swallow evaluation and trach was placed on 11/10. CXR 11/16: Diffuse bilateral pulmonary infiltrates/edema with slight improvement.      SLP Plan  Continue with current plan of care       Recommendations  Diet recommendations: Dysphagia 3 (mechanical soft);Thin liquid Liquids provided via: Cup;Straw Medication Administration: Whole meds with liquid Supervision: Intermittent supervision to cue for compensatory strategies;Staff to assist with self feeding Compensations: Slow rate;Small sips/bites;Minimize environmental distractions;Follow solids with liquid;Clear throat intermittently Postural Changes and/or Swallow Maneuvers: Seated upright 90 degrees      Patient may use Passy-Muir Speech Valve: During all waking hours (remove during sleep);During PO intake/meals;Caregiver trained to provide supervision;During all therapies with supervision PMSV Supervision: Intermittent         Follow up Recommendations: Inpatient Rehab SLP Visit Diagnosis: Dysphagia, oropharyngeal phase (R13.12) Plan: Continue with current plan of care       GO               Carlos [place], MA [place] Pager [phone] Office [phone]  Carlos Olson 05/28/2020, 10:19 AM

## 2020-05-28 NOTE — PMR Pre-admission (Signed)
PMR Admission Coordinator Pre-Admission Assessment  Patient: Carlos Olson is an 79 y.o., male MRN: [id number] DOB: 01/18/1941 Height: 6' 3" (190.5 cm) Weight: 86.3 kg  Insurance Information HMO:     PPO: yes     PCP:      IPA:      80/20:      OTHER:  PRIMARY: [employer] Medicare      Policy#: [id number]      Subscriber: pt CM Name: Nira Conn      Phone#: Beckie Busing is follow up with phone [phone]     Fax#: [phone] Pre-Cert#: [id number] approved until 12/6 when updates are due      Employer:  Benefits:  Phone #: [phone]     Name: 11/29 Eff. Date: 07/01/2019     Deduct: none      Out of Pocket Max: $5000 CIR: $295 co pay per day days 1 until 6      SNF: no copay days 1 until 20; $184 co pay per day days 21 until 100 Outpatient: $35 per visit     Co-Pay: visits per medical necessity Home Health: 100%      Co-Pay: visits per medical neccesity DME: 80%     Co-Pay: 20% Providers: in network  SECONDARY: none  Financial Counselor:       Phone#:   [employer]" for patients in Inpatient Rehabilitation Facilities with attached "Privacy Act [place] Records" was provided and verbally reviewed with: Family  Emergency Contact Information Contact Information    Name Relation Home Work [place] Spouse [phone]  [phone]   [place] Daughter [phone]  [phone]      Current Medical History  Patient Admitting Diagnosis: Debility s/p AAA repair  History of Present Illness: 79 year old male with past medical history of AAA, prostate cancer, heart murmur, diverticulitis, IBS, osteoarthritis of bilateral knees,  ACDF, COPD, CAD, and atherosclerosis presented on 04/17/2020 for elective abdominal aortic aneurysm repair.  Hospital course complicate by AKI, metabolic encephalopathy, hypoxia//RLL pneumonia and hypotension. Treated for sepsis, AF RVR and underwent cardioversion. Moved out of ICU and became encephalopathic  overnight and transferred back to ICU. Avoid all narcotics recommended. Further course with prolonged mechanical ventilation requiring Trach on 05/09/2020 and MSSA pneumonia. 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 Recommended to manage with catheter drainage. Etiology unclear, previous history of radiation for prostate cancer and diverticulosis. GI also recommends no intervention at this time.  Tolerating RA and PMSV with cuff less #6 trach. 28 % trach collar at night Periodic delirium with sundowning improved with Seroquel.   Patient's medical record from [facility]  has been reviewed by the rehabilitation admission coordinator and physician.  Past Medical History  Past Medical History:  Diagnosis Date  . AAA (abdominal aortic aneurysm) ([place])   . Atherosclerosis of aortic arch ([place]) 09/25/2015   By CT scan   . CAD (coronary artery disease) 09/25/2015   Mild 3v by CT scan   . Cancer [facility])    prostate  . Colon polyps   . COPD (chronic obstructive pulmonary disease) ([place])   . Diverticulosis of colon   . Femoral bruit    bilateral, found by Dr. Jefm Bryant  . Heart murmur    child  . History of prostate cancer   . IBS (irritable bowel syndrome)    improved after retirement  . 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  Pelvic fracture ([place])   . Pneumonia    child  . Pulmonary nodules 09/25/2015   On screening CT scan, rpt 1 yr   . Shortness of breath    doe    Family History   family history includes Cancer in his brother and father; Diabetes in his sister; Hypertension in his mother.  Prior Rehab/Hospitalizations Has the patient had prior rehab or hospitalizations prior to admission? Yes  Has the patient had major surgery during 100 days prior to admission? Yes   Current Medications  Current Facility-Administered Medications:  .  0.9 %  sodium chloride infusion, 250 mL, Intravenous,  Continuous, Angelia Mould, MD, Stopped at 05/24/20 1312 .  acetaminophen (TYLENOL) 160 MG/5ML solution 1,000 mg, 1,000 mg, Per Tube, Q6H PRN, Waynetta Sandy, MD, 1,000 mg at 05/30/20 2458 .  alum & mag hydroxide-simeth (MAALOX/MYLANTA) 200-200-20 MG/5ML suspension 15-30 mL, 15-30 mL, Per Tube, Q2H PRN, Waynetta Sandy, MD .  apixaban [facility]) tablet 5 mg, 5 mg, Per Tube, BID, Rigoberto Noel, MD, 5 mg at 05/30/20 0811 .  arformoterol (BROVANA) nebulizer solution 15 mcg, 15 mcg, Nebulization, BID, Rigoberto Noel, MD, 15 mcg at 05/30/20 0802 .  atorvastatin (LIPITOR) tablet 10 mg, 10 mg, Per Tube, QHS, Einar Grad, RPH, 10 mg at 05/29/20 2109 .  bisacodyl (DULCOLAX) suppository 10 mg, 10 mg, Rectal, Daily PRN, Angelia Mould, MD .  budesonide (PULMICORT) nebulizer solution 0.5 mg, 0.5 mg, Nebulization, BID, Rigoberto Noel, MD, 0.5 mg at 05/30/20 0802 .  chlorhexidine (PERIDEX) 0.12 % solution 15 mL, 15 mL, Mouth Rinse, BID, Waynetta Sandy, MD, 15 mL at 05/30/20 0809 .  Chlorhexidine Gluconate Cloth 2 % PADS 6 each, 6 each, Topical, Daily, Angelia Mould, MD, 6 each at 05/29/20 1000 .  cholestyramine (QUESTRAN) packet 4 g, 4 g, Per Tube, Daily, Llana Aliment, RPH, 4 g at 05/30/20 1106 .  feeding supplement (ENSURE ENLIVE / ENSURE PLUS) liquid 237 mL, 237 mL, Per Tube, BID BM, Llana Aliment, RPH, 237 mL at 05/29/20 1342 .  feeding supplement (PROSource TF) liquid 45 mL, 45 mL, Per Tube, TID, Corey Harold, NP, 45 mL at 05/30/20 0809 .  feeding supplement (VITAL 1.5 CAL) liquid 1,000 mL, 1,000 mL, Per Tube, Q24H, Kipp Brood, MD, Stopped at 05/30/20 0701 .  fiber (NUTRISOURCE FIBER) 1 packet, 1 packet, Per Tube, BID, Kipp Brood, MD, 1 packet at 05/30/20 0809 .  Gerhardt's butt cream, , Topical, PRN, Angelia Mould, MD, 1 application at 09/98/33 1535 .  ipratropium-albuterol (DUONEB) 0.5-2.5 (3) MG/3ML nebulizer solution 3  mL, 3 mL, Nebulization, Q6H PRN, Kara Mead V, MD .  lidocaine (LIDODERM) 5 % 1 patch, 1 patch, Transdermal, Q24H, Agarwala, Ravi, MD, 1 patch at 05/30/20 0809 .  lidocaine (XYLOCAINE) 2 % jelly 1 application, 1 application, Urethral, Once, Franchot Gallo, MD .  MEDLINE mouth rinse, 15 mL, Mouth Rinse, q12n4p, Waynetta Sandy, MD, 15 mL at 05/30/20 1107 .  menthol-cetylpyridinium (CEPACOL) lozenge 3 mg, 1 lozenge, Oral, Q3H PRN, Kipp Brood, MD, 3 mg at 05/27/20 1650 .  metoprolol tartrate (LOPRESSOR) 25 mg/10 mL oral suspension 50 mg, 50 mg, Per Tube, BID, Waynetta Sandy, MD, 50 mg at 05/30/20 0810 .  naloxone [facility]) injection 0.4 mg, 0.4 mg, Intravenous, PRN, Waynetta Sandy, MD, 0.4 mg at 05/01/20 0902 .  ondansetron (ZOFRAN) injection 4 mg, 4 mg, Intravenous, Q6H PRN, Angelia Mould, MD .  oxymetazoline (AFRIN) 0.05 % nasal spray 1 spray, 1 spray, Each Nare, BID PRN, Julian Hy, DO .  QUEtiapine (SEROQUEL) tablet 25 mg, 25 mg, Oral, QHS, Agarwala, Ravi, MD, 25 mg at 05/29/20 2109  Patients Current Diet:  Diet Order            DIET DYS 3 Room service appropriate? Yes; Fluid consistency: Thin  Diet effective now                 Precautions / Restrictions Precautions Precautions: Fall Precaution Comments: trach, flexiseal Restrictions Weight Bearing Restrictions: No   Has the patient had 2 or more falls or a fall with injury in the past year? No  Prior Activity Level Limited Community (1-2x/wk): Independent Community (5-7x/wk): very active walking and doing exercises pta  Prior Functional Level Self Care: Did the patient need help bathing, dressing, using the toilet or eating? Independent  Indoor Mobility: Did the patient need assistance with walking from room to room (with or without device)? Independent  Stairs: Did the patient need assistance with internal or external stairs (with or without device)?  Independent  Functional Cognition: Did the patient need help planning regular tasks such as shopping or remembering to take medications? [place] / Equipment Home Assistive Devices/Equipment: Eyeglasses, Dentures (specify type), Walker (specify type), Cane (specify quad or straight) Home Equipment: Walker - 2 wheels, Grab bars - tub/shower, Adaptive equipment  Prior Device Use: Indicate devices/aids used by the patient prior to current illness, exacerbation or injury? None of the above  Current Functional Level Cognition  Overall Cognitive Status: Impaired/Different from baseline Difficult to assess due to: Tracheostomy Current Attention Level: Sustained Orientation Level: Oriented to person, Oriented to place, Oriented to situation, Disoriented to time Following Commands: Follows one step commands with increased time Safety/Judgement: Decreased awareness of safety, Decreased awareness of deficits General Comments: progressing however continues to need direction back to task at hand but able to consistently follow cues    Extremity Assessment (includes Sensation/Coordination)  Upper Extremity Assessment: LUE deficits/detail LUE Deficits / Details: Limited AROM at L shoulder. Increased edema.  LUE Coordination: decreased gross motor  Lower Extremity Assessment: Defer to PT evaluation    ADLs  Overall ADL's : Needs assistance/impaired Eating/Feeding: Set up, Sitting Eating/Feeding Details (indicate cue type and reason): jello Grooming: Wash/dry face, Minimal assistance, Sitting, Min guard Grooming Details (indicate cue type and reason): While sitting on stedy at the sink, pt washing his face with Min Guard-Min A for safety and balance.  Upper Body Bathing: Moderate assistance, Sitting Lower Body Bathing: Total assistance, Sitting/lateral leans Upper Body Dressing : Maximal assistance, Sitting Upper Body Dressing Details (indicate cue type and reason):  changed soiled gown Lower Body Dressing: Total assistance, Sitting/lateral leans Lower Body Dressing Details (indicate cue type and reason): don and doff socks Toilet Transfer: Minimal assistance, Moderate assistance (sara stedy) Toilet Transfer Details (indicate cue type and reason): Mod A for lower surface at recliner. Requiring assist to initiate and power up.  Toileting- Clothing Manipulation and Hygiene: Total assistance, +2 for safety/equipment, Sit to/from stand Functional mobility during ADLs: Minimal assistance, Moderate assistance (sit<>stand with stedy) General ADL Comments: Pt performing sit<>stand at stedy with MIn-Mod A. Focused on challenging sitting balance and activity tolerance. Pt performing grooming task at sink with MInGUard-Min A. Pt very motivated.    Mobility  Overal bed mobility: Needs Assistance Bed Mobility: Supine to Sit Rolling: Min assist Sidelying to sit: Mod assist, +2 for physical  assistance Supine to sit: Mod assist, +2 for physical assistance, HOB elevated Sit to supine: Mod assist General bed mobility comments: modAx2 for management of LE off bed and for trunk to upright pt able to initiate pushing up, with return to bed pt requires mod A for bringing LE back into bed     Transfers  Overall transfer level: Needs assistance Equipment used:  H[place]on [place] walker) Transfer via Lift Equipment: Stedy Transfers: Sit to/from Stand Sit to Stand: +2 physical assistance, From elevated surface, Min assist, Min guard Stand pivot transfers: Total assist, +2 safety/equipment General transfer comment: pt able to come to initial standing with minAx2, vc for chest upright and anterior pelvic tilt, min guard for second attempt from higher Stedy pads and min A for last standing due to back pain     Ambulation / Gait / Stairs / Wheelchair Mobility  Ambulation/Gait Ambulation/Gait assistance: Min assist, +2 safety/equipment Gait Distance (Feet): 10 Feet Assistive device:  (Eva  walker) Gait Pattern/deviations: Step-through pattern, Decreased stride length, Decreased step length - right, Decreased step length - left, Shuffle, Drifts right/left, Trunk flexed General Gait Details: Pt was able to take a few steps with Harmon Pier walker and pt then asked to sit.  He stated, "It is weakness but also i worry because i am having a BM."    Pt did ambulate well with Harmon Pier walker.   Gait velocity interpretation: <1.31 ft/sec, indicative of household ambulator    Posture / Balance Dynamic Sitting Balance Sitting balance - Comments: able to maintain balance Balance Overall balance assessment: Needs assistance Sitting-balance support: Feet supported, Bilateral upper extremity supported Sitting balance-Leahy Scale: Fair Sitting balance - Comments: able to maintain balance Postural control: Right lateral lean Standing balance support: Bilateral upper extremity supported Standing balance-Leahy Scale: Poor Standing balance comment: able to maintain standing balance with UE support for 1.5 min without assist     Special needs/care consideration Rectal tube 05/01/20 and condom catheter ( colovesical fistula)  #6 uncuffed Shiley trach 05/26/20 PMSV on room air during the day and 28 % trach collar at HS  Cortrak 43 inches 10 Fr left nare 05/02/20  MASD 11/17 to right buttocks  Had been transferred from ICU to progressive twice with rapid response called due to narcotics oversedation and returned to ICU. AVOID narcotics per family request.  Tele sitter may be needed when family not present due to delirium and need for redirection Family member visits daily 7 am until 8 pm regularly    Previous Home Environment  Living Arrangements: Spouse/significant other  Lives With: Spouse, Daughter, Other (Comment) (daughter and son in law very involved) Available Help at Discharge: Family, Available 24 hours/day Type of Home: House Home Layout: One level Home Access: Level entry Bathroom  Shower/Tub: Multimedia programmer: Handicapped height Bathroom Accessibility: Yes How Accessible: Accessible via walker Poydras: No  Discharge Living Setting Plans for Discharge Living Setting: Patient's home, Lives with (comment) (wife) Type of Home at Discharge: House Discharge Home Layout: One level Discharge Home Access: Level entry Discharge Bathroom Shower/Tub: Walk-in shower Discharge Bathroom Toilet: Handicapped height Discharge Bathroom Accessibility: Yes How Accessible: Accessible via walker Does the patient have any problems obtaining your medications?: No  Social/Family/Support Systems Contact Information: daughter, [name] , main contact Anticipated Caregiver: daughter and son in law Anticipated Caregiver's Contact Information: see above Caregiver Availability: 24/7 Discharge Plan Discussed with Primary Caregiver: Yes Is Caregiver In Agreement with Plan?: Yes Does Caregiver/Family have Issues with Lodging/Transportation while Pt  is in Rehab?: No  Goals Patient/Family Goal for Rehab: min asisst with PT, OT and Supervision SLP Expected length of stay: ELOS 2 to 3 weeks Pt/Family Agrees to Admission and willing to participate: Yes Program Orientation Provided & Reviewed with Pt/Caregiver Including Roles  & Responsibilities: Yes  Decrease burden of Care through IP rehab admission: n/a  Possible need for SNF placement upon discharge: not anticiapted  Patient Condition: I have reviewed [facility] , spoken with CM, and patient, spouse, daughter and family member. I met with patient at the bedside for inpatient rehabilitation assessment.  Patient will benefit from ongoing PT, OT and SLP, can actively participate in 3 hours of therapy a day 5 days of the week, and can make measurable gains during the admission.  Patient will also benefit from the coordinated team approach during an Inpatient Acute Rehabilitation admission.  The  patient will receive intensive therapy as well as Rehabilitation physician, nursing, social worker, and care management interventions.  Due to bladder management, bowel management, safety, skin/wound care, disease management, medication administration, pain management and patient education the patient requires 24 hour a day rehabilitation nursing.  The patient is currently mod to max assist overall with mobility and basic ADLs.  Discharge setting and therapy post discharge at home with home health is anticipated.  Patient has agreed to participate in the Acute Inpatient Rehabilitation Program and will admit today.  Preadmission Screen Completed By:  Cleatrice Burke, 05/30/2020 11:32 AM ______________________________________________________________________   Discussed status with Dr. Posey Pronto  on  05/30/2020 at  1135 and received approval for admission today.  Admission Coordinator:  Cleatrice Burke, RN, time  5361 Date  05/30/2020   Assessment/Plan: Diagnosis: Debility   1. Does the need for close, 24 hr/day Medical supervision in concert with the patient's rehab needs make it unreasonable for this patient to be served in a less intensive setting? Yes  2. Co-Morbidities requiring supervision/potential complications:  AAA, prostate cancer, heart murmur, diverticulitis, IBS, osteoarthritis of bilateral knees,  ACDF, COPD, CAD, and atherosclerosis, AKI, metabolic encephalopathy, hypoxia//RLL pneumonia and hypotension.  3. Due to bladder management, bowel management, safety, skin/wound care, disease management, medication administration, pain management and patient education, does the patient require 24 hr/day rehab nursing? Yes 4. Does the patient require coordinated care of a physician, rehab nurse, PT, OT, and SLP to address physical and functional deficits in the context of the above medical diagnosis(es)? Yes Addressing deficits in the following areas: balance, endurance, locomotion,  strength, transferring, bathing, dressing, toileting, cognition and psychosocial support 5. Can the patient actively participate in an intensive therapy program of at least 3 hrs of therapy 5 days a week? Yes 6. The potential for patient to make measurable gains while on inpatient rehab is excellent 7. Anticipated functional outcomes upon discharge from inpatient rehab: modified independent and supervision PT, modified independent and supervision OT, modified independent SLP 8. Estimated rehab length of stay to reach the above functional goals is: 14-17 days. 9. Anticipated discharge destination: Home 10. Overall Rehab/Functional Prognosis: good   MD Signature: Delice Lesch, MD, ABPMR

## 2020-05-28 NOTE — Progress Notes (Signed)
Inpatient Rehabilitation Admissions Coordinator  I have received updated PT and SLP notes from today. I will begin insurance authorization with [facility] today which can take up to 3 days for determination.   Danne Baxter, RN, MSN Rehab Admissions Coordinator [phone] 05/28/2020 3:14 PM

## 2020-05-28 NOTE — Progress Notes (Addendum)
  Progress Note    05/28/2020 7:54 AM 32 Days Post-Op  Subjective:  No complaints this morning.  Sitting up in chair   Vitals:   05/28/20 0750 05/28/20 0751  BP:    Pulse:  96  Resp:  (!) 25  Temp:  97.9 F (36.6 C)  SpO2: 99% 97%   Physical Exam: Lungs:  Trach collar Incisions:  abd and groin incisions well healed Extremities:  Feet symmetrically warm with good cap refill Abdomen:  Soft, NT, ND Neurologic: A&O  CBC    Component Value Date/Time   WBC 6.5 05/27/2020 0121   RBC 3.15 (L) 05/27/2020 0121   HGB 9.6 (L) 05/27/2020 0121   HGB 12.1 (L) 08/20/2012 0610   HCT 31.4 (L) 05/27/2020 0121   HCT 44.0 08/02/2012 0853   PLT 294 05/27/2020 0121   PLT 158 08/20/2012 0610   MCV 99.7 05/27/2020 0121   MCV 99 08/02/2012 0853   MCH 30.5 05/27/2020 0121   MCHC 30.6 05/27/2020 0121   RDW 16.7 (H) 05/27/2020 0121   RDW 13.7 08/02/2012 0853   LYMPHSABS 0.7 05/19/2020 0056   MONOABS 0.9 05/19/2020 0056   EOSABS 0.5 05/19/2020 0056   BASOSABS 0.1 05/19/2020 0056    BMET    Component Value Date/Time   NA 138 05/27/2020 0121   NA 136 08/20/2012 0610   K 3.9 05/27/2020 0121   K 4.2 08/20/2012 0610   CL 105 05/27/2020 0121   CL 102 08/20/2012 0610   CO2 21 (L) 05/27/2020 0121   CO2 26 08/20/2012 0610   GLUCOSE 127 (H) 05/27/2020 0121   GLUCOSE 123 (H) 08/20/2012 0610   BUN 26 (H) 05/27/2020 0121   BUN 7 08/20/2012 0610   CREATININE 1.06 05/27/2020 0121   CREATININE 0.75 08/20/2012 0610   CALCIUM 9.0 05/27/2020 0121   CALCIUM 8.4 (L) 08/20/2012 0610   GFRNONAA >60 05/27/2020 0121   GFRNONAA >60 08/20/2012 0610   GFRAA >60 05/07/2017 0949   GFRAA >60 08/20/2012 0610    INR    Component Value Date/Time   INR 1.3 (H) 05/07/2020 0435   INR 1.0 08/02/2012 0853     Intake/Output Summary (Last 24 hours) at 05/28/2020 0754 Last data filed at 05/28/2020 0600 Gross per 24 hour  Intake 1110 ml  Output 500 ml  Net 610 ml     Assessment/Plan:  79 y.o. 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 Plan for CIR if/when available.  Maedell Hedger C. Donzetta Matters, MD [facility] Pager: [phone]

## 2020-05-28 NOTE — Progress Notes (Signed)
NAME:  Carlos Olson, MRN:  [id number], DOB:  12/17/1940, LOS: 48 ADMISSION DATE:  04/17/2020, CONSULTATION DATE: 10/21 REFERRING MD: Dr. Donzetta Matters, CHIEF COMPLAINT: AKI with mild hypoxia  Brief History   79 year old male who presented 10/18 for elective endovascular repair abdominal aortic aneurysm with Dr. Donzetta Matters. PCCM consulted 10/21 for assistance in medical management.  Hospital course complicated by AKI, metabolic encephalopathy, hypoxia/ RLL pneumonia, and hypotension. Treated in ICU for sepsis, AFRVR, underwent cardioversion, ABX course. Moved out of ICU. Ongoing course significant for AKI, AF, debility. Then 11/2 he became encephalopathic overnight and was transferred back to ICU. PCCM re-consulted.  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  Past Medical History  AAA History of prostate cancer Heart murmur Diverticulitis COPD CAD atherosclerosis  Significant Hospital Events   Admitted 10/18 for elective AAA repair 10/19 OR  11/15 fever while on cefazolin, changed to ceftriaxone  Consults:  Cardiology PCCM Nephrology GI  Procedures:  Endovascular AAA repair 10/19 Tracheostomy tube placement 11/10  Significant Diagnostic Tests:  04/21/2020 CT A/P > Status post abdominal aortic repair with aortobifemoral bypass graft. Some stranding is noted in the operative site likely related to postoperative change. No sizable hematoma is seen. Diverticulosis without diverticulitis. Near complete consolidation of the right lower lobe. Small left pleural effusion is seen with left basilar atelectasis. 10/25 CTH > 1. Stable.  No acute intracranial abnormality. 2. Atrophy with chronic small vessel white matter ischemic disease.  Micro Data:  Blood 10/28 > negative Sputum 10/28 > few candida Tracheal aspirate 11/12-MSSA 11/15 urine-E.Coli>>  pansensitive  Antimicrobials:  10/19 cefazolin  10/22 zosyn > 10/26 Vancomycin 10/29 > 11/2  Zosyn 10/29 > 11/3 Fluconazole 10/29 > Zyvox 11/12 Ceftriaxone 11/15 >  Interim history/subjective:  Sitting up in the chair this morning with Passy-Muir valve and on room air.  No complaints   Objective   Blood pressure (!) 112/58, pulse 88, temperature 97.9 F (36.6 C), temperature source Oral, resp. rate (!) 25, height 6\' 3"  (1.905 m), weight 87.3 kg, SpO2 95 %.    FiO2 (%):  [21 %-35 %] 28 %   Intake/Output Summary (Last 24 hours) at 05/28/2020 1126 Last data filed at 05/28/2020 1100 Gross per 24 hour  Intake 1350 ml  Output 500 ml  Net 850 ml   Filed Weights   05/26/20 0500 05/27/20 0500 05/28/20 0500  Weight: 87.6 kg 87.7 kg 87.3 kg   General:  eldlerly M, well-appearing and sitting up in chair without distress HEENT: MM pink/moist, Trach and Passy Muir valve in place  Neuro: awake and alert, conversational and following commands CV: s1s2 , no m/r/g PULM:  On RA, clear bilaterally without wheezing or rhonchi GI: soft, bsx4 active Extremities: warm/dry, no edema  Skin: no rashes or lesions   Resolved Hospital Problem list   Hypotension due to hypovolemia  RLL pneumonia Acute metabolic and toxic encephalopathy -Avoid all narcotics MSSA pneumonia  Assessment & Plan:   Acute hypercarbic respiratory failure -Now chronic failure in the setting of critical illness -Status post tracheostomy 11/10 -HX COPD without acute exacerbation P: Tolerating RA and Passy Muir valve with cuffless #6 Shiley Routine trach care As needed bronchodilators SPO2 goal greater than 88 Encourage pulmonary hygiene Mobilize as able, possibly to inpatient rehab today   Periodic delirium with sundowning -Improved with Seroquel.  Colovesical fistula  -etiology unclear, previous history of radiation for prostate cancer  and diverticulosis. - Evaluated by GI and urology, no intervention to be  made at this time\ P: Completed 10 days of ceftriaxone Continue Foley  Atrial fibrillation - Will need OP imaging  follow up to monitor for amio lung toxicity ( previous diagnosis of COPD)>> Followed by Dr. Lamonte Sakai  P: Continue Metroprolol Continue apixaban Continuous telemetry  Anemia  P: Hemoglobin continues to remain stable with no acute signs of bleeding Transfuse per protocol follow  Intermittently trend CBC   History of pulmonary nodule noted 02/2020 -Plan to follow as an outpatient -Pt is due for 3 month follow up 05/2020 P: Supportive care  Recent repair of abdominal aortic aneurysm P: Management per vascular surgery  Malnutrition P: Transition to nocturnal feeds to allow for daytime oral intake Continue SLP eval Dysphagia 3 diet  Best practice:  Diet: Continue enteral nutrition TF and oral feeds.  Pain/Anxiety/Delirium protocol (if indicated: Xanax decreased): Seroquel VAP protocol (if indicated): n/a DVT prophylaxis: Eliquis GI prophylaxis: No longer indicated Glucose control: euglycemic on no coverage Mobility: PT as tolerated Code Status: Full Family Communication: Per primary Disposition: Hopefully to CIR soon     CRITICAL CARE Performed by: [name] Valicia Rief   Total critical care time: 33 minutes  Critical care time was exclusive of separately billable procedures and treating other patients.  Critical care was necessary to treat or prevent imminent or life-threatening deterioration.  Critical care was time spent personally by me on the following activities: development of treatment plan with patient and/or surrogate as well as nursing, discussions with consultants, evaluation of patient's response to treatment, examination of patient, obtaining history from patient or surrogate, ordering and performing treatments and interventions, ordering and review of laboratory studies, ordering and review of radiographic studies, pulse oximetry and  re-evaluation of patient's condition.    [name] Naftali Carchi, PA-C [place] [facility]  Pager# [phone], if no answer [phone]

## 2020-05-29 NOTE — TOC Progression Note (Signed)
Transition of Care [facility] - Progression Note    Patient Details  Name: Carlos Olson MRN: [id number] Date of Birth: 1941-06-28  Transition of [facility]) CM/SW Contact  Zenon Mayo, RN Phone Number: 05/29/2020, 1:50 PM  Clinical Narrative:    Per Pamala Hurry with CIR , she has approval for CIR admit on 12/1 for this patient.         Expected Discharge Plan and Services                                                 Social Determinants of Health (SDOH) Interventions    Readmission Risk Interventions No flowsheet data found.

## 2020-05-29 NOTE — Progress Notes (Signed)
Nutrition Follow-up  DOCUMENTATION CODES:   Severe malnutrition in context of acute illness/injury  INTERVENTION:   Ensure Enlive po TID, each supplement provides 350 kcal and 20 grams of protein  Continue Regular diet; family to bring in outside food  Tube Feeding via Cortrak-Switch to Nocturnal Feedings:  Change to Osmolite 1.5 at 75 ml/hr x 14 hours per day Pro-Source 45 mL TID Provides 1695 kcals, 99 g of protein  Meets 75% calorie needs, 80% protein needs  NUTRITION DIAGNOSIS:   Severe Malnutrition related to acute illness (AAA repair surgery with minimal intake x 10 days since admission) as evidenced by moderate muscle depletion, energy intake < or equal to 50% for > or equal to 5 days.  Being addressed via nutrition support, supplements  GOAL:   Patient will meet greater than or equal to 90% of their needs  Progressing  MONITOR:   PO intake, Supplement acceptance  REASON FOR ASSESSMENT:   Rounds    ASSESSMENT:   79 yo male admitted 10/18 for elective endovascular repair of AAA. PMH includes AAA, prostate Ca, heart murmur, diverticulitis, COPD, CAD, atherosclerosis, IBS.  11/02Obtunded, hypercarbic, transferred to ICU, NG tube inserted, TF started  11/03 Cortrak placed 11/05 Re-Intubated 11/10 Trach placed, possible colovesical fistula, stool in urine 11/23 Diet advanced to Dysphagia 3, thins post MBS 11/24 Cortrak dislodged and readjusted   Noted plan for discharge to CIR tomorrow, Tolerating PMV  Pt tolerating po diet but not eating much. Pt did not eat any breakfast this AM. Recorded po intake 0-50%, 30% on average. Pt drinking some of the Ensure but not much. Wife is at bedside and is encouraging pt to increase po intake; wife brought in pt's favorite sandwich which she hopes he will eat for lunch today.   Tolerating nocturnal TF of Vital 1.5 at 75 ml/hr x 14 hours with Pro-source 47mL TID  Labs: reviewed Meds: reviewed; questran and nutrisource  fiber continued   Diet Order:   Diet Order            DIET DYS 3 Room service appropriate? Yes; Fluid consistency: Thin  Diet effective now                 EDUCATION NEEDS:   No education needs have been identified at this time  Skin:  Skin Assessment: Reviewed RN Assessment (abd & R groin surgical incisions) Skin Integrity Issues:: Other (Comment) Other: MASD to buttocks  Last BM:  11/30 combined urine and stool with colovesical fistula  Height:   Ht Readings from Last 1 Encounters:  05/07/20 6\' 3"  (1.905 m)    Weight:   Wt Readings from Last 1 Encounters:  05/29/20 86.6 kg    Ideal Body Weight:  89.1 kg  BMI:  Body mass index is 23.86 kg/m.  Estimated Nutritional Needs:   Kcal:  2200-2400 kcals  Protein:  120-145 g  Fluid:  >/= 2.2 L   Kerman Passey MS, RDN, LDN, CNSC Registered Dietitian III Clinical Nutrition RD Pager and On-Call Pager Number Located in [place]

## 2020-05-29 NOTE — Discharge Summary (Signed)
Aorta Discharge Summary    Carlos Olson 1940-08-30 79 y.o. male  [id number]  Admission Date: 04/17/2020  Discharge Date: 05/30/2020  Physician: Thomes Lolling*  Admission Diagnosis: AAA (abdominal aortic aneurysm) [facility]) [I71.4]   Discharge Diagnosis: AAA Colovesicular fistula Atrial fibrillation     Hospital Course: Mr. 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 Donzetta Matters on 04/17/2020.  He tolerated the procedure well and was admitted to the ICU postoperatively.  He was extubated in the ICU postoperatively.  In the first few days postoperatively patient was experiencing severe abdominal pain.  The working diagnosis was colonic ischemia thus CCM and GI were consulted.  CT was negative for frank colonic ischemia.  Nephrology was also consulted due to AKI however patient never developed need for hemodialysis during hospitalization.  Patient also developed atrial fibrillation with RVR and thus cardiology was consulted.  He was started on IV heparin several days postoperatively due to this.  Throughout the course of his hospital stay the main concern was pulmonary status.  He was started on broad-spectrum antibiotics for suspected pneumonia while still in the ICU.  Eventually cardiology performed TEE and cardioversion on 04/26/2020 and he was stable for transfer to stepdown unit.  It should also be noted that patient received several units of RBCs due to slow hemoglobin drift postoperatively however no bleeding source was identified.  After about a week on the stepdown unit patient's pulmonary status declined and he required transfer back to ICU and reintubation on 11/5.  He was  extubated in the next couple days however required another reintubation on 11/9.  At this point CCM proceeded with bronchoscopy and tracheostomy placement on 05/09/2020.  He was around this time that patient was also noted to have fecal material and air coming from his Foley and patient was diagnosed with a colovesicular fistula.  Urology was consulted and GI was reconsulted at this time.  Patient was not a candidate for surgery given his critical condition and thus Foley was recommended as well as antibiotics to treat conservatively.  Patient was also covered for suspected hospital-acquired pneumonia with antibiotics and slowly weaned from mechanical ventilation.  Speech therapy was also consulted for aspiration precautions and gradually advanced his diet.  Patient eventually was weaned from ventilation and had a trach collar.  The rest of the hospital stay consisted of increasing mobility.  Physical therapy and Occupational Therapy evaluated the patient and recommended CIR.  CIR was consulted and believed patient to be a good candidate, thus, case was submitted for insurance authorization.  It should also be noted that during hospitalization patient was transitioned from IV heparin to Eliquis.  Patient will follow up with urology as an outpatient for management of colovesicular fistula.  He will also follow-up with cardiology for management of atrial fibrillation on Eliquis.  Renal function returned to baseline thus patient will not require follow-up with nephrology.  Discussed with Dr. Donzetta Matters and will resume pt's home dose of asa.      CBC    Component Value Date/Time   WBC 6.3 05/30/2020 0035   RBC 3.01 (L) 05/30/2020 0035   HGB 9.2 (L) 05/30/2020 0035   HGB 12.1 (L) 08/20/2012 1007  HCT 30.2 (L) 05/30/2020 0035   HCT 44.0 08/02/2012 0853   PLT 241 05/30/2020 0035   PLT 158 08/20/2012 0610   MCV 100.3 (H) 05/30/2020 0035   MCV 99 08/02/2012 0853   MCH 30.6 05/30/2020 0035   MCHC 30.5 05/30/2020  0035   RDW 16.4 (H) 05/30/2020 0035   RDW 13.7 08/02/2012 0853   LYMPHSABS 0.7 05/19/2020 0056   MONOABS 0.9 05/19/2020 0056   EOSABS 0.5 05/19/2020 0056   BASOSABS 0.1 05/19/2020 0056    BMET    Component Value Date/Time   NA 138 05/30/2020 0035   NA 136 08/20/2012 0610   K 3.8 05/30/2020 0035   K 4.2 08/20/2012 0610   CL 107 05/30/2020 0035   CL 102 08/20/2012 0610   CO2 20 (L) 05/30/2020 0035   CO2 26 08/20/2012 0610   GLUCOSE 119 (H) 05/30/2020 0035   GLUCOSE 123 (H) 08/20/2012 0610   BUN 30 (H) 05/30/2020 0035   BUN 7 08/20/2012 0610   CREATININE 1.22 05/30/2020 0035   CREATININE 0.75 08/20/2012 0610   CALCIUM 9.0 05/30/2020 0035   CALCIUM 8.4 (L) 08/20/2012 0610   GFRNONAA 60 (L) 05/30/2020 0035   GFRNONAA >60 08/20/2012 0610   GFRAA >60 05/07/2017 0949   GFRAA >60 08/20/2012 0610     Discharge Instructions    Discharge patient   Complete by: As directed    Transfer to CIR   Discharge disposition: [place] Not Defined   Discharge patient date: 05/30/2020      Discharge Diagnosis:  AAA (abdominal aortic aneurysm) ([place]) [I71.4]  Secondary Diagnosis: Patient Active Problem List   Diagnosis Date Noted  . Status post tracheostomy ([place])   . Pressure injury of skin 05/16/2020  . Acute on chronic respiratory failure with hypoxia ([place])   . HAP (hospital-acquired pneumonia)   . Colovesical fistula   . Protein-calorie malnutrition, severe 04/29/2020  . AKI (acute kidney injury) ([place])   . AAA (abdominal aortic aneurysm) ([place]) 04/17/2020  . Aortic atherosclerosis ([place]) 03/30/2020  . Osteopenia 03/08/2020  . AAA (abdominal aortic aneurysm) without rupture ([place]) 03/02/2020  . Stiffness of multiple joints 10/10/2019  . Carotid stenosis, asymptomatic, bilateral 02/06/2019  . Vertigo 01/28/2019  . Abrasion of nose 04/23/2018  . Thoracic ascending aortic aneurysm ([place]) 10/26/2017  . S/P total knee arthroplasty 05/20/2017  . Osteoarthritis  09/22/2016  . Pulmonary nodules 09/25/2015  . Atherosclerosis of aortic arch ([place]) 09/25/2015  . [place] 08/17/2015  . Advanced care planning/counseling discussion 09/08/2014  . Preoperative cardiovascular examination 09/08/2014  . 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 Spinal stenosis in cervical region 11/22/2013  . Medicare annual wellness visit, subsequent 06/22/2012  . Cervical stenosis of spinal canal 07/29/2011    Class: Chronic  . SHOULDER PAIN, RIGHT 04/18/2010  . History of tobacco abuse 12/02/2006  . COPD (chronic obstructive pulmonary disease) ([place]) 12/01/2006  . DIVERTICULOSIS, COLON 12/01/2006  . Chronic back pain 12/01/2006  . PROSTATE CANCER, HX OF 12/01/2006   Past Medical History:  Diagnosis Date  . AAA (abdominal aortic aneurysm) ([place])   . Atherosclerosis of aortic arch ([place]) 09/25/2015   By CT scan   . CAD (coronary artery disease) 09/25/2015   Mild 3v by CT scan   . Cancer [facility])    prostate  . Colon polyps   . COPD (chronic obstructive pulmonary disease) ([place])   . Diverticulosis of colon   . Femoral bruit  bilateral, found by Dr. Jefm Bryant  . Heart murmur    child  . History of prostate cancer   . IBS (irritable bowel syndrome)    improved after retirement  . 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 Pelvic fracture ([place])   . Pneumonia    child  . Pulmonary nodules 09/25/2015   On screening CT scan, rpt 1 yr   . Shortness of breath    doe     Allergies as of 05/30/2020      Reactions   Levaquin [levofloxacin] Other (See Comments)   Aortic Aneurysm   Dilaudid [hydromorphone Hcl] Other (See Comments)   Oversedation   Sulfasalazine Itching   Sulfonamide Derivatives Itching      Medication List    STOP taking these medications   albuterol 108 (90 Base) MCG/ACT inhaler Commonly known as: VENTOLIN HFA   multivitamin with minerals Tabs tablet   Trelegy Ellipta  100-62.5-25 MCG/INH Aepb Generic drug: Fluticasone-Umeclidin-Vilant     TAKE these medications   acetaminophen 500 MG tablet Commonly known as: TYLENOL Take 1,000 mg by mouth every 6 (six) hours as needed for moderate pain or headache.   apixaban 5 MG Tabs tablet Commonly known as: ELIQUIS Place 1 tablet (5 mg total) into feeding tube 2 (two) times daily.   arformoterol 15 MCG/2ML Nebu Commonly known as: BROVANA Take 2 mLs (15 mcg total) by nebulization 2 (two) times daily.   aspirin EC 81 MG tablet Take 1 tablet (81 mg total) by mouth daily.   atorvastatin 10 MG tablet Commonly known as: LIPITOR Place 1 tablet (10 mg total) into feeding tube at bedtime.   budesonide 0.5 MG/2ML nebulizer solution Commonly known as: PULMICORT Take 2 mLs (0.5 mg total) by nebulization 2 (two) times daily.   cholestyramine 4 g packet Commonly known as: QUESTRAN Place 1 packet (4 g total) into feeding tube daily.   feeding supplement Liqd Place 237 mLs into feeding tube 2 (two) times daily between meals.   feeding supplement (PROSource TF) liquid Place 45 mLs into feeding tube 3 (three) times daily.   feeding supplement (VITAL 1.5 CAL) Liqd Place 1,000 mLs into feeding tube daily.   fiber Pack packet Place 1 packet into feeding tube 2 (two) times daily.   Gerhardt's butt cream Crea Apply 1 application topically as needed for irritation.   lidocaine 5 % Commonly known as: LIDODERM Place 1 patch onto the skin daily. Remove & Discard patch within 12 hours or as directed by MD   menthol-cetylpyridinium 3 MG lozenge Commonly known as: CEPACOL Take 1 lozenge (3 mg total) by mouth every 3 (three) hours as needed for sore throat.   metoprolol tartrate 25 mg/10 mL Susp Commonly known as: LOPRESSOR Place 20 mLs (50 mg total) into feeding tube 2 (two) times daily.   mouth rinse Liqd solution 15 mLs by Mouth Rinse route 2 times daily at 12 noon and 4 pm.   oxymetazoline 0.05 % nasal  spray Commonly known as: AFRIN Place 1 spray into both nostrils 2 (two) times daily as needed (epistaxis).   QUEtiapine 25 MG tablet Commonly known as: SEROQUEL Take 1 tablet (25 mg total) by mouth at bedtime.       Instructions:  [facility] Discharge Instructions Open Aortic Surgery  Please refer to the following instructions for your post-procedure care. Your surgeon or Physician Assistant will discuss any changes with you.  Activity  Avoid lifting more than eight pounds (a gallon of milk) until after your first post-operative visit. You are encouraged to walk as much as you can. You can slowly return to normal activities but must avoid strenuous activity and heavy lifting until your doctor tells you it's OK. Heavy lifting can hurt the incision and cause a hernia. Avoid activities such as vacuuming or swinging a golf club. It is normal to feel tired for several weeks after your surgery. Do not drive until your doctor gives the OK and you are no longer taking prescription pain medications. It is also normal to have difficulty with sleep habits, eating and bowl movements after surgery. These will go away with time.  Bathing/Showering  You may shower after you go home. Do not soak in a bathtub, hot tub, or swim until the incision heals.  Incision Care  Shower every day. Clean your incision with mild soap and water. Pat the area dry with a clean towel. You do not need a bandage unless otherwise instructed. Do not apply any ointments or creams to your incision. You may have skin glue on your incision. Do not peel it off. It will come off on its own in about one week. If you have staples or sutures along your incision, they will be removed at your post op appointment.  If you have groin incisions, wash the groin wounds with soap and water daily and pat dry. (No tub bath-only shower)  Then put a dry gauze or washcloth in the groin to keep this area dry to  help prevent wound infection.  Do this daily and as needed.  Do not use Vaseline or neosporin on your incisions.  Only use soap and water on your incisions and then protect and keep dry.  Diet  Resume your normal diet. There are no special food restriction following this procedure. A low fat/low cholesterol diet is recommended for all patients with vascular disease. After your aortic surgery, it's normal to feel full faster than usual and to not feel as hungry as you normally would. You will probably lose weight initially following your surgery. It's best to eat small, frequent meals over the course of the day. Call the office if you find that you are unable to eat even small meals. In order to heal from your surgery, it is CRITICAL to get adequate nutrition. Your body requires vitamins, minerals, and protein. Vegetables are the best source of vitamins and minerals. causing pain, you may take over-the-counter pain reliever such as acetaminophen (Tylenol). If you were prescribed a stronger pain medication, please be aware these medication can cause nausea and constipation. Prevent nausea by taking the medication with a snack or meal. Avoid constipation by drinking plenty of fluids and eating foods with a high amount of fiber, such as fruits, vegetables and grains. Take 100mg  of the over-the-counter stool softener Colace twice a day as needed to help with constipation. A laxative, such as Milk of Magnesia, may be recommended for you at this time. Do not take a laxative unless your surgeon or Physician Assistant. tells you it's OK. Do not take Tylenol if you are taking stronger pain medications (such as Percocet).  Follow Up  Our office will schedule a follow up appointment 2-3 weeks after discharge.  Please call us immediately for any of the following conditions    .     Severe or worsening pain in your legs or feet or in your abdomen back or chest. . Increased pain,  redness drainage (pus) from your  incision site. . Increased abdominal pain, bloating, nausea, vomiting, or persistent diarrhea. . Fever of 101 degrees or higher. . Swelling in your leg (s). .  Reduce your risk of vascular disease  . Stop smoking. If you would like help, call [facility] at 1-800-QUIT-NOW 947 157 6759) or [place] at 223 547 8869. . Manage your cholesterol . Maintain a desired weight . Control your diabetes . Keep your blood pressure down .  If you have any questions please call the office at [phone].   Disposition: CIR  Patient's condition: is Fair  Follow up: 1. Dr. Donzetta Matters in 2 weeks 2. Cardiology for afib 3. Urology for colovesical fistula   Dagoberto Ligas, PA-C [facility] [phone] 05/30/2020  7:38 AM   - For VQI Registry use -  Post-op:  Time to Extubation: []  In OR, [ x] < 12 hrs, [ ]  12-24 hrs, [ ]  >=24 hrs Vasopressors Req. Post-op: Yes ICU Stay: 14 days in ICU Transfusion: Yes   If yes, 4 units given MI: No, [ ]  Troponin only, [ ]  EKG or Clinical New Arrhythmia: Yes  Complications: CHF: No Resp failure: Yes, [x ] Pneumonia, [x ] Ventilator Chg in renal function: Yes, [x ] Inc. Cr > 0.5, [ ]  Temp. Dialysis, [ ]  Permanent dialysis Leg ischemia: No, no Surgery needed, [ ]  Yes, Surgery needed, [ ]  Amputation Bowel ischemia: No, [ ]  Medical Rx, [ ]  Surgical Rx Wound complication: No, [ ]  Superficial separation/infection, [ ]  Return to OR Return to OR: No  Return to OR for bleeding: No Stroke: No, [ ]  Minor, [ ]  Major  Discharge medications: Statin use:  Yes  ASA use:  Yes   Plavix use:  No  Beta blocker use:  Yes  ACEI use:  No ARB use:  No CCB use:  No Coumadin use:  Yes, Eliquis

## 2020-05-29 NOTE — Progress Notes (Signed)
Occupational Therapy Treatment Patient Details Name: Carlos Olson MRN: [id number] DOB: 1940/12/18 Today's Date: 05/29/2020    History of present illness 79 year old male who presented 10/18 for elective endovascular repair abdominal aortic aneurysm. Hospital course complicated by AKI, metabolic encephalopathy, acute hypercarbic respiratory failure, hypoxia/ RLL pneumonia, and hypotension. Treated in ICU for sepsis, AFRVR, underwent cardioversion, ABX course. SLP ordered 10/28 given pt coughing with PO liquid intake. MBS 10/29: mild oropharyngeal dysphagia characterized by reduced bolus propulsion, reduced lingual retraction, reduced pharyngeal constriction, and reduced anterior laryngeal movement. Recommended dys 3 diet with thin liquid. Pt became obtunded on 11/2 and was transferred back to ICU. Pt made NPO and Cortrak placed on 11/3, SLP recommended pt remain NPO with water protocol. Pt reintubated 11/5-11/8. CXR 11/9 consistent with previous small bilateral pleural effusions and right lung base aeration.    OT comments  Pt continues to be highly motivated to participate in therapy and is progressing towards established OT goals. Pt performing sit<>stand with stedy and Min-Mod A. Focused on challenging sitting balance and activity tolerance while performing grooming task at sink. Pt washing his face with Min guard-Min A for sitting balance. VSS on RA throughout. [name] also very support and present for session. Pt continue to present with decreased balance, strength, cognition, and activity tolerance impacting his safe performance of ADLs. Continue to recommend dc to CIR for intensive OT and will continue to follow acutely as admitted.    Follow Up Recommendations  CIR;Supervision/Assistance - 24 hour    Equipment Recommendations  Other (comment) (defer to next venue)    Recommendations for Other Services      Precautions / Restrictions Precautions Precautions: Fall Precaution  Comments: trach, flexiseal       Mobility Bed Mobility Overal bed mobility: Needs Assistance Bed Mobility: Sit to Supine       Sit to supine: Mod assist   General bed mobility comments: Mod A for manading BLEs  Transfers Overall transfer level: Needs assistance   Transfers: Sit to/from Stand Sit to Stand: Mod assist;Min assist         General transfer comment: Mod A for lower surface at recliner. Requiring assist to initiate and power up.     Balance Overall balance assessment: Needs assistance Sitting-balance support: Feet supported;Bilateral upper extremity supported Sitting balance-Leahy Scale: Fair Sitting balance - Comments: Able to perform grooming while seated   Standing balance support: Bilateral upper extremity supported Standing balance-Leahy Scale: Poor Standing balance comment: Reliant on UE support in standing                           ADL either performed or assessed with clinical judgement   ADL Overall ADL's : Needs assistance/impaired     Grooming: Wash/dry face;Minimal assistance;Sitting;Min guard Grooming Details (indicate cue type and reason): While sitting on stedy at the sink, pt washing his face with Min Guard-Min A for safety and balance.                  Toilet Transfer: Minimal assistance;Moderate assistance (sara stedy) Toilet Transfer Details (indicate cue type and reason): Mod A for lower surface at recliner. Requiring assist to initiate and power up.          Functional mobility during ADLs: Minimal assistance;Moderate assistance (sit<>stand with stedy) General ADL Comments: Pt performing sit<>stand at stedy with MIn-Mod A. Focused on challenging sitting balance and activity tolerance. Pt performing grooming task at sink with MInGUard-Min  A. Pt very motivated.     Vision Patient Visual Report: No change from baseline     Perception     Praxis      Cognition Arousal/Alertness: Awake/alert Behavior During  Therapy: WFL for tasks assessed/performed Overall Cognitive Status: Impaired/Different from baseline Area of Impairment: Attention;Memory;Following commands;Safety/judgement;Awareness;Problem solving                   Current Attention Level: Sustained Memory: Decreased short-term memory Following Commands: Follows one step commands with increased time Safety/Judgement: Decreased awareness of safety;Decreased awareness of deficits Awareness: Intellectual;Emergent Problem Solving: Slow processing;Decreased initiation;Difficulty sequencing;Requires verbal cues General Comments: Continues to present with decreased awareness, problem solving, attention, and memory. During conversation, pt can be tangiental and off topic. Pt following simple cues with calm environment.         Exercises Exercises: General Lower Extremity General Exercises - Lower Extremity Long Arc Quad: AROM;Both;10 reps;Seated Hip Flexion/Marching: AROM;Both;10 reps;Seated   Shoulder Instructions       General Comments SpO2 90s on RA with PSMV on trach. HR stable.     Pertinent Vitals/ Pain       Pain Assessment: No/denies pain  Home Living Family/patient expects to be discharged to:: Private residence Living Arrangements: Spouse/significant other Available Help at Discharge: Family;Available 24 hours/day Type of Home: House Home Access: Level entry     Home Layout: One level     Bathroom Shower/Tub: Occupational psychologist: Handicapped height Bathroom Accessibility: Yes How Accessible: Accessible via walker Home Equipment: Walker - 2 wheels;Grab bars - tub/shower;Adaptive equipment Adaptive Equipment: Reacher;Sock aid    Lives With: Spouse;Daughter;Other (Comment) (daughter and son in law very involved)    Prior Functioning/Environment Level of Independence: Independent            Frequency  Min 2X/week        Progress Toward Goals  OT Goals(current goals can now be found  in the care plan section)  Progress towards OT goals: Progressing toward goals  Acute Rehab OT Goals Patient Stated Goal: to go home OT Goal Formulation: With patient Time For Goal Achievement: 06/06/20 Potential to Achieve Goals: Good ADL Goals Pt Will Perform Grooming: with min assist;sitting Pt Will Perform Lower Body Bathing: with mod assist;sit to/from stand Pt Will Perform Lower Body Dressing: with mod assist;sit to/from stand Pt Will Transfer to Toilet: with mod assist;stand pivot transfer;bedside commode Pt Will Perform Toileting - Clothing Manipulation and hygiene: with mod assist;sit to/from stand Additional ADL Goal #1: Pt will perform bed mobility with min assist in preparation for ADL.  Plan Discharge plan remains appropriate    Co-evaluation    PT/OT/SLP Co-Evaluation/Treatment: Yes            AM-PAC OT "6 Clicks" Daily Activity     Outcome Measure   Help from another person eating meals?: A Little Help from another person taking care of personal grooming?: A Little Help from another person toileting, which includes using toliet, bedpan, or urinal?: A Lot Help from another person bathing (including washing, rinsing, drying)?: A Lot Help from another person to put on and taking off regular upper body clothing?: A Lot Help from another person to put on and taking off regular lower body clothing?: Total 6 Click Score: 13    End of Session Equipment Utilized During Treatment: Oxygen  OT Visit Diagnosis: Pain;Muscle weakness (generalized) (M62.81);Unsteadiness on feet (R26.81);Other abnormalities of gait and mobility (R26.89);Other symptoms and signs involving cognitive function  Activity Tolerance Patient tolerated treatment well   Patient Left in bed;with call bell/phone within reach;with family/visitor present;with bed alarm set;with nursing/sitter in room   Nurse Communication Mobility status        Time: 0981-1914 OT Time Calculation (min): 28  min  Charges: OT General Charges $OT Visit: 1 Visit OT Treatments $Self Care/Home Management : 23-37 mins  [place], OTR/L Acute Rehab Pager: [phone] Office: [place] 05/29/2020, 12:13 PM

## 2020-05-29 NOTE — H&P (Signed)
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  History taken from chart review and daughter due to processing.  Patient lives with spouse.  Independent prior to admission.  1 level home with level entry.  Daughter and son also very involved with support.  He presented on 04/17/2020 for elective AAA repair as well as right common femoral artery aneurysm repair per Dr. Donzetta Matters.  Postoperative developed hypotension requiring phenylephrine and cardiology service follow-up.  Most recent echocardiogram reviewed showing EF of 81% grade 1 diastolic dysfunction.  TEE completed showed ejection fraction of 50-55%. No wall motion abnormalities without thrombus.  Noted AKI with creatinine peak 2.7.  Renal ultrasound no hydronephrosis.  Nephrology services consulted for follow-up.  Patient developed abdominal pain concern for mesenteric ischemia gastroenterology services Dr. Wilfrid Lund consulted.  CT abdomen pelvis showed abdominal aortic repair some stranding noted no sizable hematoma seen.  There was noted near complete consolidation of right lower lobe small left pleural effusion seen with left basilar atelectasis.  Patient developed delirium placed on Seroquel felt to be multifactorial with cranial CT scan 04/23/2020 without acute changes.  Patient was ultimately placed on Eliquis for findings of atrial fibrillation.  A nasogastric tube was placed for nutritional support as well as hydration and renal function much improved latest creatinine 1.066.  Patient did require intubation for ventilatory support due to respiratory distress 05/04/2020 with tracheostomy performed 05/09/2020 per critical care  medicine [name] nurse practitioner as well as treated for MSSA PNA. He currently has a cuffless #6 Shiley trach tube in place.  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  There was consideration for endoscopy of which patient declined.  His diet has been advanced to mechanical soft thin liquid.  Hospital course further complicated by acute blood loss anemia, 9.6 monitored.  Therapy evaluations completed and patient was admitted for a comprehensive rehab program.  Please see preadmission assessment from earlier today as well.  Review of Systems  Constitutional: Positive for malaise/fatigue. Negative for chills and fever.  HENT: Negative for hearing loss.   Eyes: Negative for blurred vision and double vision.  Respiratory: Positive for shortness of breath.   Cardiovascular: Negative for chest pain and palpitations.  Gastrointestinal: Positive for abdominal pain. Negative for heartburn and nausea.  Genitourinary: Positive for urgency. Negative for dysuria, flank pain and hematuria.  Musculoskeletal: Positive for myalgias.  Skin: Negative for rash.  Neurological: Positive for weakness.  Psychiatric/Behavioral: The patient has insomnia.   All other systems reviewed and are negative.  Past Medical History:  Diagnosis Date  . AAA (abdominal aortic aneurysm) ([place])   . Atherosclerosis of aortic arch ([place]) 09/25/2015   By CT scan   . CAD (coronary artery disease) 09/25/2015   Mild 3v by CT scan   . Cancer [facility])    prostate  . Colon polyps   . COPD (chronic obstructive pulmonary disease) ([place])   . Diverticulosis of colon   . Femoral bruit    bilateral, found by Dr. Jefm Bryant  . Heart murmur  child  . History of prostate cancer   . IBS (irritable bowel  syndrome)    improved after retirement  . 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 Pelvic fracture ([place])   . Pneumonia    child  . Pulmonary nodules 09/25/2015   On screening CT scan, rpt 1 yr   . Shortness of breath    doe   Past Surgical History:  Procedure Laterality Date  . ANTERIOR CERVICAL DECOMP/DISCECTOMY FUSION  07/29/2011   Procedure: ANTERIOR CERVICAL DECOMPRESSION/DISCECTOMY FUSION 1 LEVEL;  Surgeon: Jessy Oto, MD;  Location: [place];  Service: Orthopedics;  Laterality: N/A;  Anterior Cervical Discectomy and Fusion C3-4  . AORTA - BILATERAL FEMORAL ARTERY BYPASS GRAFT N/A 04/17/2020   Procedure: AORTA BIFEMORAL BYPASS GRAFT USING A HEMASHIELD GOLD BIFURCATED GRAFT;  Surgeon: Waynetta Sandy, MD;  Location: [place];  Service: Vascular;  Laterality: N/A;  . BUBBLE STUDY  04/26/2020   Procedure: BUBBLE STUDY;  Surgeon: Freada Bergeron, MD;  Location: [place];  Service: Cardiovascular;;  . CARDIOVERSION N/A 04/26/2020   Procedure: CARDIOVERSION;  Surgeon: Freada Bergeron, MD;  Location: [facility];  Service: Cardiovascular;  Laterality: N/A;  . CATARACT EXTRACTION W/PHACO Right 07/10/2015   Procedure: CATARACT EXTRACTION PHACO AND INTRAOCULAR LENS PLACEMENT ([place]);  Surgeon: Birder Robson, MD;  Location: [facility];  Service: Ophthalmology;  Laterality: Right;  [place] 00:58   . CATARACT EXTRACTION W/PHACO Left 07/24/2015   Procedure: CATARACT EXTRACTION PHACO AND INTRAOCULAR LENS PLACEMENT (I[facility]);  Surgeon: Birder Robson, MD;  Location: [facility];  Service: Ophthalmology;  Laterality: Left;  [place] 00:38   . COLONOSCOPY  12/2007   [place], diverticulosis and polyp, rpt 5 yrs  . dental implants    . EMBOLIZATION Right 03/26/2020   Procedure: EMBOLIZATION;  Surgeon: Waynetta Sandy, MD;  Location: [place] [facility];  Service: Cardiovascular;  Laterality: Right;  . FORAMINOTOMY 2 LEVEL Left 10/2013   C4/5, C5/6  ([place])  . HERNIA REPAIR     right 05/1999, left 09/04/05  . JOINT REPLACEMENT Right    TKR  . KNEE ARTHROPLASTY Left 05/20/2017   Procedure: COMPUTER ASSISTED TOTAL KNEE ARTHROPLASTY;  Surgeon: Dereck Leep, MD;  Location: [facility];  Service: Orthopedics;  Laterality: Left;  . KNEE ARTHROSCOPY  09/1997   left  . POSTERIOR CERVICAL FUSION/FORAMINOTOMY N/A 11/22/2013   Procedure: LEFT C4-5 AND C5-6 FORAMINOTOMY;  Surgeon: Jessy Oto, MD;  Location: [place];  Service: Orthopedics;  Laterality: N/A;  . PROSTATECTOMY  08/1990  . 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 TEE WITHOUT CARDIOVERSION N/A 04/26/2020   Procedure: TRANSESOPHAGEAL ECHOCARDIOGRAM (TEE);  Surgeon: Freada Bergeron, MD;  Location: [facility];  Service: Cardiovascular;  Laterality: N/A;  . TONSILLECTOMY     Family History  Problem Relation Age of Onset  . Hypertension Mother   . Cancer Father        bone, prostate  . Diabetes Sister        x 40 years  . Cancer Brother        prostate   Social History:  reports that he quit smoking about 13 years ago. His smoking use included cigarettes. He has a 104.00 pack-year smoking history. He has never used smokeless tobacco. He reports current alcohol use of about 14.0 standard drinks of alcohol per week. He reports that he does not use drugs. Allergies:  Allergies  Allergen Reactions  . Levaquin [Levofloxacin] Other (  See Comments)    Aortic Aneurysm  . Dilaudid [Hydromorphone Hcl] Other (See Comments)    Oversedation  . Sulfasalazine Itching  . Sulfonamide Derivatives Itching   Medications Prior to Admission  Medication Sig Dispense Refill  . acetaminophen (TYLENOL) 500 MG tablet Take 1,000 mg by mouth every 6 (six) hours as needed for moderate pain or headache.     Marland Kitchen aspirin EC 81 MG tablet Take 1 tablet (81 mg total) by mouth daily.    . Fluticasone-Umeclidin-Vilant (TRELEGY ELLIPTA) 100-62.5-25 MCG/INH AEPB Inhale 1 puff into the lungs daily. 60 each 5  .  Multiple Vitamin (MULTIVITAMIN WITH MINERALS) TABS tablet Take 1 tablet by mouth daily. Multivitamin for Men    . albuterol (VENTOLIN HFA) 108 (90 Base) MCG/ACT inhaler Inhale 2 puffs into the lungs every 6 (six) hours as needed for wheezing or shortness of breath. 1 Inhaler 3    Drug Regimen Review Drug regimen was reviewed and remains appropriate with no significant issues identified  Home: Home Living Family/patient expects to be discharged to:: Private residence Living Arrangements: Spouse/significant other Available Help at Discharge: Family, Available 24 hours/day Type of Home: House Home Access: Level entry Home Layout: One level Bathroom Shower/Tub: Multimedia programmer: Handicapped height Bathroom Accessibility: Yes Home Equipment: Environmental consultant - 2 wheels, Grab bars - tub/shower, Financial controller: Secondary school teacher, Sock aid  Lives With: Spouse, Daughter, Other (Comment) (daughter and son in law very involved)   Functional History: Prior Function Level of Independence: Independent  Functional Status:  Mobility: Bed Mobility Overal bed mobility: Needs Assistance Bed Mobility: Sit to Supine Rolling: Mod assist Sidelying to sit: +2 for physical assistance, Mod assist Supine to sit: Mod assist, +2 for physical assistance, HOB elevated Sit to supine: Mod assist General bed mobility comments: Mod A for manading BLEs Transfers Overall transfer level: Needs assistance Equipment used:  [place] Pier walker) Transfer via Lift Equipment: Stedy Transfers: Sit to/from Stand Sit to Stand: Mod assist, Min assist Stand pivot transfers: Total assist, +2 safety/equipment General transfer comment: Mod A for lower surface at recliner. Requiring assist to initiate and power up.  Ambulation/Gait Ambulation/Gait assistance: Min assist, +2 safety/equipment Gait Distance (Feet): 10 Feet Assistive device:  (Eva walker) Gait Pattern/deviations: Step-through pattern, Decreased  stride length, Decreased step length - right, Decreased step length - left, Shuffle, Drifts right/left, Trunk flexed General Gait Details: Pt was able to take a few steps with Harmon Pier walker and pt then asked to sit.  He stated, "It is weakness but also i worry because i am having a BM."    Pt did ambulate well with Harmon Pier walker.   Gait velocity interpretation: <1.31 ft/sec, indicative of household ambulator    ADL: ADL Overall ADL's : Needs assistance/impaired Eating/Feeding: Set up, Sitting Eating/Feeding Details (indicate cue type and reason): jello Grooming: Wash/dry face, Minimal assistance, Sitting, Min guard Grooming Details (indicate cue type and reason): While sitting on stedy at the sink, pt washing his face with Min Guard-Min A for safety and balance.  Upper Body Bathing: Moderate assistance, Sitting Lower Body Bathing: Total assistance, Sitting/lateral leans Upper Body Dressing : Maximal assistance, Sitting Upper Body Dressing Details (indicate cue type and reason): changed soiled gown Lower Body Dressing: Total assistance, Sitting/lateral leans Lower Body Dressing Details (indicate cue type and reason): don and doff socks Toilet Transfer: Minimal assistance, Moderate assistance (sara stedy) Toilet Transfer Details (indicate cue type and reason): Mod A for lower surface at recliner. Requiring assist to initiate and power  up.  Toileting- Clothing Manipulation and Hygiene: Total assistance, +2 for safety/equipment, Sit to/from stand Functional mobility during ADLs: Minimal assistance, Moderate assistance (sit<>stand with stedy) General ADL Comments: Pt performing sit<>stand at stedy with MIn-Mod A. Focused on challenging sitting balance and activity tolerance. Pt performing grooming task at sink with MInGUard-Min A. Pt very motivated.  Cognition: Cognition Overall Cognitive Status: Impaired/Different from baseline Orientation Level: Oriented to person, Oriented to place, Disoriented to  time, Disoriented to situation Cognition Arousal/Alertness: Awake/alert Behavior During Therapy: WFL for tasks assessed/performed Overall Cognitive Status: Impaired/Different from baseline Area of Impairment: Attention, Memory, Following commands, Safety/judgement, Awareness, Problem solving Current Attention Level: Sustained Memory: Decreased short-term memory Following Commands: Follows one step commands with increased time Safety/Judgement: Decreased awareness of safety, Decreased awareness of deficits Awareness: Intellectual, Emergent Problem Solving: Slow processing, Decreased initiation, Difficulty sequencing, Requires verbal cues General Comments: Continues to present with decreased awareness, problem solving, attention, and memory. During conversation, pt can be tangiental and off topic. Pt following simple cues with calm environment.  Difficult to assess due to: Tracheostomy  Physical Exam: Blood pressure (!) 105/51, pulse 65, temperature 99.2 F (37.3 C), temperature source Axillary, resp. rate (!) 21, height 6\' 3"  (1.905 m), weight 86.3 kg, SpO2 100 %. Physical Exam Vitals reviewed.  Constitutional:      General: He is not in acute distress.    Appearance: He is not ill-appearing.  HENT:     Head: Normocephalic and atraumatic.     Nose: Nose normal.     Comments: + NG Eyes:     General:        Right eye: No discharge.        Left eye: No discharge.     Extraocular Movements: Extraocular movements intact.  Neck:     Comments: Tracheostomy tube in place with PMV Cardiovascular:     Rate and Rhythm: Normal rate and regular rhythm.  Pulmonary:     Effort: Pulmonary effort is normal. No respiratory distress.     Breath sounds: No stridor.  Abdominal:     General: Bowel sounds are normal.     Palpations: Abdomen is soft.  Genitourinary:    Comments: + Rectal tube Musculoskeletal:     Comments: No edema or tenderness in extremities  Skin:    General: Skin is warm  and dry.  Neurological:     Mental Status: He is alert.     Comments: Alert and oriented x2 with increased time Makes eye contact with examiner and does follow simple commands.   Motor: Grossly 4+/5 throughout Sensation intact light touch  Psychiatric:        Speech: Speech is delayed.        Cognition and Memory: Cognition is impaired.     Results for orders placed or performed during the hospital encounter of 04/17/20 (from the past 48 hour(s))  CBC     Status: Abnormal   Collection Time: 05/30/20 12:35 AM  Result Value Ref Range   WBC 6.3 4.0 - 10.5 K/uL   RBC 3.01 (L) 4.22 - 5.81 MIL/uL   Hemoglobin 9.2 (L) 13.0 - 17.0 g/dL   HCT 30.2 (L) 39 - 52 %   MCV 100.3 (H) 80.0 - 100.0 fL   MCH 30.6 26.0 - 34.0 pg   MCHC 30.5 30.0 - 36.0 g/dL   RDW 16.4 (H) 11.5 - 15.5 %   Platelets 241 150 - 400 K/uL   nRBC 0.0 0.0 - 0.2 %  Comment: Performed at [facility], [place] [address], [place], [place] [address]  Basic metabolic panel     Status: Abnormal   Collection Time: 05/30/20 12:35 AM  Result Value Ref Range   Sodium 138 135 - 145 mmol/L   Potassium 3.8 3.5 - 5.1 mmol/L   Chloride 107 98 - 111 mmol/L   CO2 20 (L) 22 - 32 mmol/L   Glucose, Bld 119 (H) 70 - 99 mg/dL    Comment: Glucose reference range applies only to samples taken after fasting for at least 8 hours.   BUN 30 (H) 8 - 23 mg/dL   Creatinine, Ser 1.22 0.61 - 1.24 mg/dL   Calcium 9.0 8.9 - 10.3 mg/dL   GFR, Estimated 60 (L) >60 mL/min    Comment: (NOTE) Calculated using the CKD-EPI Creatinine Equation (2021)    Anion gap 11 5 - 15    Comment: Performed at [facility] [address], [place], [place] [address]  Magnesium     Status: None   Collection Time: 05/30/20 12:35 AM  Result Value Ref Range   Magnesium 1.9 1.7 - 2.4 mg/dL    Comment: Performed at [facility], [place] [address], [place], [place] [address]   No results found.     Medical Problem List and Plan: 1.  Debility secondary  to AAA repair 76/22/6333 complicated by acute respiratory failure/MSSA pneumonia  -patient may not shower  -ELOS/Goals: Supervision/Mod I 13-17 days  Admit to CIR 2.  Antithrombotics: -DVT/anticoagulation: Eliquis  -antiplatelet therapy: N/A 3. Pain Management: Lidoderm patch only.  PATIENT IS TO HAVE NO OTHER NARCOTICS DUE TO BOUTS OF DELERIUM AND CONFUSION RELATED TO PAIN MEDS.  Monitor with increased exertion 4. Mood: Provide emotional support  -antipsychotic agents: Seroquel 25 mg nightly 5. Neuropsych: This patient is not fully capable of making decisions on his own behalf. 6. Skin/Wound Care: Routine skin checks 7. Fluids/Electrolytes/Nutrition: Routine in and outs.  CMP ordered 8.  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  Currently #6 cuffless Shiley in place.  Follow-up per critical care medicine  Continue to monitor and consider capping trials. 9.  AKI.  Improved.  Renal ultrasound no hydronephrosis.    CMP ordered for tomorrow. 10.  Hypotension.    Monitor with increased mobility. 11.  Atrial fibrillation.  Lopressor 50 mg twice daily, continue Eliquis.  Cardiac rate controlled.  Follow-up cardiology services as needed  Monitor with increased activity. 12.  Acute on chronic anemia.     CBC ordered for tomorrow a.m.   13.  COPD with remote history of tobacco abuse.  Continue nebulizers as directed 14.  Hyperlipidemia.  Lipitor 15.  Prostate cancer with prostatectomy 1992.  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  Follow-up urology as well as gastroenterology services.  Patient currently declined endoscopy. 17. Decreased nutritional storage. Patient presently on dysphagia #3 thin liquid diet. Supplemental tube feeds. Dietary follow-up  Cathlyn Parsons, PA-C 05/30/2020  I have personally performed a face to face diagnostic evaluation, including, but not limited to relevant history and physical exam findings, of this patient and developed relevant  assessment and plan.  Additionally, I have reviewed and concur with the physician assistant's documentation above.  Delice Lesch, MD, ABPMR

## 2020-05-29 NOTE — Progress Notes (Addendum)
NAME:  Carlos Olson, MRN:  [id number], DOB:  1941/02/25, LOS: 82 ADMISSION DATE:  04/17/2020, CONSULTATION DATE: 10/21 REFERRING MD: Dr. Donzetta Matters, CHIEF COMPLAINT: AKI with mild hypoxia  Brief History   79 year old male who presented 10/18 for elective endovascular repair abdominal aortic aneurysm with Dr. Donzetta Matters. PCCM consulted 10/21 for assistance in medical management.  Hospital course complicated by AKI, metabolic encephalopathy, hypoxia/ RLL pneumonia, and hypotension. Treated in ICU for sepsis, AFRVR, underwent cardioversion, ABX course. Moved out of ICU. Ongoing course significant for AKI, AF, debility. Then 11/2 he became encephalopathic overnight and was transferred back to ICU. PCCM re-consulted.  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  Past Medical History  AAA History of prostate cancer Heart murmur Diverticulitis COPD CAD atherosclerosis  Significant Hospital Events   Admitted 10/18 for elective AAA repair 10/19 OR  11/15 fever while on cefazolin, changed to ceftriaxone  Consults:  Cardiology PCCM Nephrology GI  Procedures:  Endovascular AAA repair 10/19 Tracheostomy tube placement 11/10  Significant Diagnostic Tests:  04/21/2020 CT A/P > Status post abdominal aortic repair with aortobifemoral bypass graft. Some stranding is noted in the operative site likely related to postoperative change. No sizable hematoma is seen. Diverticulosis without diverticulitis. Near complete consolidation of the right lower lobe. Small left pleural effusion is seen with left basilar atelectasis. 10/25 CTH > 1. Stable.  No acute intracranial abnormality. 2. Atrophy with chronic small vessel white matter ischemic disease.  Micro Data:  Blood 10/28 > negative Sputum 10/28 > few candida Tracheal aspirate 11/12-MSSA 11/15 urine-E.Coli>>  pansensitive  Antimicrobials:  10/19 cefazolin  10/22 zosyn > 10/26 Vancomycin 10/29 > 11/2  Zosyn 10/29 > 11/3 Fluconazole 10/29 > Zyvox 11/12 Ceftriaxone 11/15 >  Interim history/subjective:  Pt is in bed, wearing PMV on RA with sats of 98% Night time trach collar as he has some desaturations at night Per wife, periods of confusion at night and early after waking up,  T Max 98.7 Pt has not been capped, but has been tolerating PMV Will need follow up with [name] at the [facility] as OP for decannulation after CIR  Objective   Blood pressure 125/62, pulse 84, temperature 98.2 F (36.8 C), temperature source Oral, resp. rate (!) 26, height 6\' 3"  (1.905 m), weight 86.6 kg, SpO2 94 %.    FiO2 (%):  [21 %-28 %] 28 %   Intake/Output Summary (Last 24 hours) at 05/29/2020 1053 Last data filed at 05/29/2020 0600 Gross per 24 hour  Intake 1223.75 ml  Output 1700 ml  Net -476.25 ml   Filed Weights   05/27/20 0500 05/28/20 0500 05/29/20 0700  Weight: 87.7 kg 87.3 kg 86.6 kg   General:  eldlerly M, well-appearing and sitting up in bed without distress HEENT: MM pink/moist, Trach and Passy Muir valve in place  Neuro: awake and alert, conversational and following commands CV: s1s2 , no m/r/g PULM:  On RA, clear bilaterally without wheezing or rhonchi GI: soft, bsx4 active Extremities: warm/dry, no edema , brisk capillary refill Skin: no rashes or lesions   Resolved Hospital Problem list   Hypotension due to hypovolemia  RLL pneumonia Acute metabolic and toxic encephalopathy -Avoid all narcotics MSSA pneumonia  Assessment & Plan:   Acute hypercarbic respiratory failure -Now chronic failure in the setting of critical illness -Status post tracheostomy 11/10 -HX COPD without acute exacerbation P: Tolerating RA and Passy  Muir valve with cuffless #6 Shiley Routine trach care As needed bronchodilators SPO2 goal greater than 88 Encourage pulmonary hygiene Mobilize  as able, possibly to inpatient rehab in next few days   Periodic delirium with sundowning -Improved with Seroquel.  Colovesical fistula  -etiology unclear, previous history of radiation for prostate cancer and diverticulosis. - Evaluated by GI and urology, no intervention to be made at this time\ P: Completed 10 days of ceftriaxone Continue Foley  Atrial fibrillation - Will need OP imaging  follow up to monitor for amio lung toxicity ( previous diagnosis of COPD)>> Followed by Dr. Lamonte Sakai  P: Continue Metroprolol Continue apixaban Continuous telemetry Trend BMET to support Na and K repletion as needed  Anemia  P: Hemoglobin continues to remain stable with no acute signs of bleeding Transfuse per protocol follow  Intermittently trend CBC   History of pulmonary nodule noted 02/2020 -Plan to follow as an outpatient -Pt is due for 3 month follow up 05/2020 P: Supportive care  Recent repair of abdominal aortic aneurysm P: Management per vascular surgery  Malnutrition P: Transition to nocturnal feeds to allow for daytime oral intake Continue SLP eval   Pt continues to improve.Tolerating PMV and ATC at bedtime.  Patient has had failure on the floor x 2, requiring return to ICU each time after decompensation.  Prefer to keep patient in ICU until he has a bed on CIR to prevent risk of repeat decompensation and return to ICU. Will need OP follow up at trach clinic [name], NP) after d/c home.  Best practice:  Diet: Continue enteral nutrition TF and oral feeds.  Pain/Anxiety/Delirium protocol (if indicated: Xanax decreased): Seroquel VAP protocol (if indicated): n/a DVT prophylaxis: Eliquis GI prophylaxis: No longer indicated Glucose control: euglycemic on no coverage Mobility: PT as tolerated Code Status: Full Family Communication: Per primary Disposition: Hopefully to CIR soon     CRITICAL CARE Performed by: Magdalen Spatz   Total critical care time: 30   minutes  Critical care time was exclusive of separately billable procedures and treating other patients.  Critical care was necessary to treat or prevent imminent or life-threatening deterioration.  Critical care was time spent personally by me on the following activities: development of treatment plan with patient and/or surrogate as well as nursing, discussions with consultants, evaluation of patient's response to treatment, examination of patient, obtaining history from patient or surrogate, ordering and performing treatments and interventions, ordering and review of laboratory studies, ordering and review of radiographic studies, pulse oximetry and re-evaluation of patient's condition.   Magdalen Spatz, MSN, AGACNP-BC [place] for personal pager PCCM on call pager [phone] 05/29/2020 10:54 AM

## 2020-05-29 NOTE — Plan of Care (Signed)
  Problem: Respiratory: Goal: Patent airway maintenance will improve Outcome: Progressing   Problem: Respiratory: Goal: Ability to maintain a clear airway and adequate ventilation will improve Outcome: Progressing   Problem: Activity: Goal: Ability to tolerate increased activity will improve Outcome: Progressing

## 2020-05-29 NOTE — Progress Notes (Addendum)
  Progress Note    05/29/2020 7:37 AM 33 Days Post-Op  Subjective:  No complaints this morning   Vitals:   05/29/20 0700 05/29/20 0732  BP:    Pulse: 85 86  Resp: (!) 23 (!) 23  Temp:    SpO2: (!) 87% 93%   Physical Exam Lungs:  Non labored Incisions:  abd and groin incision well healed Extremities:  Feet warm with good cap refill Abdomen:  Soft, NT, ND Neurologic: A&O  CBC    Component Value Date/Time   WBC 6.5 05/27/2020 0121   RBC 3.15 (L) 05/27/2020 0121   HGB 9.6 (L) 05/27/2020 0121   HGB 12.1 (L) 08/20/2012 0610   HCT 31.4 (L) 05/27/2020 0121   HCT 44.0 08/02/2012 0853   PLT 294 05/27/2020 0121   PLT 158 08/20/2012 0610   MCV 99.7 05/27/2020 0121   MCV 99 08/02/2012 0853   MCH 30.5 05/27/2020 0121   MCHC 30.6 05/27/2020 0121   RDW 16.7 (H) 05/27/2020 0121   RDW 13.7 08/02/2012 0853   LYMPHSABS 0.7 05/19/2020 0056   MONOABS 0.9 05/19/2020 0056   EOSABS 0.5 05/19/2020 0056   BASOSABS 0.1 05/19/2020 0056    BMET    Component Value Date/Time   NA 138 05/27/2020 0121   NA 136 08/20/2012 0610   K 3.9 05/27/2020 0121   K 4.2 08/20/2012 0610   CL 105 05/27/2020 0121   CL 102 08/20/2012 0610   CO2 21 (L) 05/27/2020 0121   CO2 26 08/20/2012 0610   GLUCOSE 127 (H) 05/27/2020 0121   GLUCOSE 123 (H) 08/20/2012 0610   BUN 26 (H) 05/27/2020 0121   BUN 7 08/20/2012 0610   CREATININE 1.06 05/27/2020 0121   CREATININE 0.75 08/20/2012 0610   CALCIUM 9.0 05/27/2020 0121   CALCIUM 8.4 (L) 08/20/2012 0610   GFRNONAA >60 05/27/2020 0121   GFRNONAA >60 08/20/2012 0610   GFRAA >60 05/07/2017 0949   GFRAA >60 08/20/2012 0610    INR    Component Value Date/Time   INR 1.3 (H) 05/07/2020 0435   INR 1.0 08/02/2012 0853     Intake/Output Summary (Last 24 hours) at 05/29/2020 0737 Last data filed at 05/29/2020 0600 Gross per 24 hour  Intake 1343.75 ml  Output 1700 ml  Net -356.25 ml     Assessment/Plan:  79 y.o. 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 Doing well this morning. Wife updated at bedside.   Ketara Cavness C. Donzetta Matters, MD [facility] [place] Office: [phone] Pager: [phone]

## 2020-05-29 NOTE — Progress Notes (Signed)
Inpatient Rehabilitation Admissions Coordinator  I have insurance approval and CIR bed available to admit patient to on Wednesday. available. I met with patient and his wife at bedside and left message for his daughter to call me. I will follow up tomorrow to make arrangements to admit. Acute team and TOC made aware.   , RN, MSN Rehab Admissions Coordinator [phone] 05/29/2020 1:54 PM  

## 2020-05-30 ENCOUNTER — Inpatient Hospital Stay (HOSPITAL_COMMUNITY)
Admission: RE | Admit: 2020-05-30 | Discharge: 2020-06-20 | DRG: 945 | Disposition: A | Discharge status: Home Health | Payer: Medicare HMO | Source: Intra-hospital | Attending: Physical Medicine & Rehabilitation | Admitting: Physical Medicine & Rehabilitation

## 2020-05-30 ENCOUNTER — Other Ambulatory Visit: Payer: Self-pay

## 2020-05-30 ENCOUNTER — Encounter (HOSPITAL_COMMUNITY): Payer: Self-pay | Admitting: Physical Medicine & Rehabilitation

## 2020-05-30 DIAGNOSIS — Z981 Arthrodesis status: Secondary | ICD-10-CM

## 2020-05-30 DIAGNOSIS — E785 Hyperlipidemia, unspecified: Secondary | ICD-10-CM | POA: Diagnosis present

## 2020-05-30 DIAGNOSIS — Z8042 Family history of malignant neoplasm of prostate: Secondary | ICD-10-CM | POA: Diagnosis not present

## 2020-05-30 DIAGNOSIS — N179 Acute kidney failure, unspecified: Secondary | ICD-10-CM | POA: Diagnosis present

## 2020-05-30 DIAGNOSIS — K580 Irritable bowel syndrome with diarrhea: Secondary | ICD-10-CM | POA: Diagnosis present

## 2020-05-30 DIAGNOSIS — Z86010 Personal history of colonic polyps: Secondary | ICD-10-CM | POA: Diagnosis not present

## 2020-05-30 DIAGNOSIS — Z9842 Cataract extraction status, left eye: Secondary | ICD-10-CM

## 2020-05-30 DIAGNOSIS — Z882 Allergy status to sulfonamides status: Secondary | ICD-10-CM

## 2020-05-30 DIAGNOSIS — Z930 Tracheostomy status: Secondary | ICD-10-CM | POA: Diagnosis not present

## 2020-05-30 DIAGNOSIS — Z9841 Cataract extraction status, right eye: Secondary | ICD-10-CM

## 2020-05-30 DIAGNOSIS — N321 Vesicointestinal fistula: Secondary | ICD-10-CM | POA: Diagnosis present

## 2020-05-30 DIAGNOSIS — I480 Paroxysmal atrial fibrillation: Secondary | ICD-10-CM | POA: Diagnosis not present

## 2020-05-30 DIAGNOSIS — I9589 Other hypotension: Secondary | ICD-10-CM | POA: Diagnosis present

## 2020-05-30 DIAGNOSIS — Z9079 Acquired absence of other genital organ(s): Secondary | ICD-10-CM

## 2020-05-30 DIAGNOSIS — Z8546 Personal history of malignant neoplasm of prostate: Secondary | ICD-10-CM | POA: Diagnosis not present

## 2020-05-30 DIAGNOSIS — Z96651 Presence of right artificial knee joint: Secondary | ICD-10-CM | POA: Diagnosis present

## 2020-05-30 DIAGNOSIS — R1312 Dysphagia, oropharyngeal phase: Secondary | ICD-10-CM | POA: Diagnosis present

## 2020-05-30 DIAGNOSIS — Z833 Family history of diabetes mellitus: Secondary | ICD-10-CM

## 2020-05-30 DIAGNOSIS — Z8249 Family history of ischemic heart disease and other diseases of the circulatory system: Secondary | ICD-10-CM

## 2020-05-30 DIAGNOSIS — I2510 Atherosclerotic heart disease of native coronary artery without angina pectoris: Secondary | ICD-10-CM | POA: Diagnosis present

## 2020-05-30 DIAGNOSIS — Z79899 Other long term (current) drug therapy: Secondary | ICD-10-CM

## 2020-05-30 DIAGNOSIS — D649 Anemia, unspecified: Secondary | ICD-10-CM | POA: Diagnosis not present

## 2020-05-30 DIAGNOSIS — J449 Chronic obstructive pulmonary disease, unspecified: Secondary | ICD-10-CM | POA: Diagnosis present

## 2020-05-30 DIAGNOSIS — J9621 Acute and chronic respiratory failure with hypoxia: Secondary | ICD-10-CM | POA: Diagnosis present

## 2020-05-30 DIAGNOSIS — R1310 Dysphagia, unspecified: Secondary | ICD-10-CM

## 2020-05-30 DIAGNOSIS — Z87891 Personal history of nicotine dependence: Secondary | ICD-10-CM | POA: Diagnosis not present

## 2020-05-30 DIAGNOSIS — Z96653 Presence of artificial knee joint, bilateral: Secondary | ICD-10-CM | POA: Diagnosis present

## 2020-05-30 DIAGNOSIS — R5381 Other malaise: Principal | ICD-10-CM | POA: Diagnosis present

## 2020-05-30 DIAGNOSIS — Z961 Presence of intraocular lens: Secondary | ICD-10-CM | POA: Diagnosis present

## 2020-05-30 DIAGNOSIS — Z881 Allergy status to other antibiotic agents status: Secondary | ICD-10-CM

## 2020-05-30 DIAGNOSIS — G4700 Insomnia, unspecified: Secondary | ICD-10-CM | POA: Diagnosis present

## 2020-05-30 DIAGNOSIS — Z808 Family history of malignant neoplasm of other organs or systems: Secondary | ICD-10-CM

## 2020-05-30 DIAGNOSIS — R410 Disorientation, unspecified: Secondary | ICD-10-CM | POA: Diagnosis not present

## 2020-05-30 DIAGNOSIS — Z7982 Long term (current) use of aspirin: Secondary | ICD-10-CM

## 2020-05-30 DIAGNOSIS — G931 Anoxic brain damage, not elsewhere classified: Secondary | ICD-10-CM | POA: Diagnosis present

## 2020-05-30 DIAGNOSIS — R0989 Other specified symptoms and signs involving the circulatory and respiratory systems: Secondary | ICD-10-CM | POA: Diagnosis not present

## 2020-05-30 DIAGNOSIS — Z888 Allergy status to other drugs, medicaments and biological substances status: Secondary | ICD-10-CM

## 2020-05-30 DIAGNOSIS — I482 Chronic atrial fibrillation, unspecified: Secondary | ICD-10-CM | POA: Diagnosis not present

## 2020-05-30 DIAGNOSIS — I714 Abdominal aortic aneurysm, without rupture: Secondary | ICD-10-CM | POA: Diagnosis not present

## 2020-05-30 DIAGNOSIS — I959 Hypotension, unspecified: Secondary | ICD-10-CM | POA: Diagnosis present

## 2020-05-30 DIAGNOSIS — Z8679 Personal history of other diseases of the circulatory system: Secondary | ICD-10-CM

## 2020-05-30 DIAGNOSIS — D62 Acute posthemorrhagic anemia: Secondary | ICD-10-CM | POA: Diagnosis not present

## 2020-05-30 DIAGNOSIS — E861 Hypovolemia: Secondary | ICD-10-CM | POA: Diagnosis present

## 2020-05-30 DIAGNOSIS — M8580 Other specified disorders of bone density and structure, unspecified site: Secondary | ICD-10-CM | POA: Diagnosis present

## 2020-05-30 DIAGNOSIS — R4189 Other symptoms and signs involving cognitive functions and awareness: Secondary | ICD-10-CM | POA: Diagnosis present

## 2020-05-30 DIAGNOSIS — I4891 Unspecified atrial fibrillation: Secondary | ICD-10-CM | POA: Diagnosis present

## 2020-05-30 DIAGNOSIS — T50905A Adverse effect of unspecified drugs, medicaments and biological substances, initial encounter: Secondary | ICD-10-CM | POA: Diagnosis not present

## 2020-05-30 LAB — CBC
HCT: 30.2 % — ABNORMAL LOW (ref 39.0–52.0)
Hemoglobin: 9.2 g/dL — ABNORMAL LOW (ref 13.0–17.0)
MCH: 30.6 pg (ref 26.0–34.0)
MCHC: 30.5 g/dL (ref 30.0–36.0)
MCV: 100.3 fL — ABNORMAL HIGH (ref 80.0–100.0)
Platelets: 241 10*3/uL (ref 150–400)
RBC: 3.01 MIL/uL — ABNORMAL LOW (ref 4.22–5.81)
RDW: 16.4 % — ABNORMAL HIGH (ref 11.5–15.5)
WBC: 6.3 10*3/uL (ref 4.0–10.5)
nRBC: 0 % (ref 0.0–0.2)

## 2020-05-30 LAB — BASIC METABOLIC PANEL
Anion gap: 11 (ref 5–15)
BUN: 30 mg/dL — ABNORMAL HIGH (ref 8–23)
CO2: 20 mmol/L — ABNORMAL LOW (ref 22–32)
Calcium: 9 mg/dL (ref 8.9–10.3)
Chloride: 107 mmol/L (ref 98–111)
Creatinine, Ser: 1.22 mg/dL (ref 0.61–1.24)
GFR, Estimated: 60 mL/min — ABNORMAL LOW (ref >60)
Glucose, Bld: 119 mg/dL — ABNORMAL HIGH (ref 70–99)
Potassium: 3.8 mmol/L (ref 3.5–5.1)
Sodium: 138 mmol/L (ref 135–145)

## 2020-05-30 LAB — MAGNESIUM: Magnesium: 1.9 mg/dL (ref 1.7–2.4)

## 2020-05-30 MED ORDER — LIDOCAINE 5 % EX PTCH: 1.0000 | MEDICATED_PATCH | CUTANEOUS | Status: DC

## 2020-05-30 MED ORDER — LIDOCAINE 5 % EX PTCH
1.0000 | MEDICATED_PATCH | CUTANEOUS | Status: DC
  Administered 2020-05-31 – 2020-06-19 (×20): 1 via TRANSDERMAL
  Filled 2020-05-30 (×21): qty 1

## 2020-05-30 MED ORDER — NUTRISOURCE FIBER PO PACK: 1.0000 | PACK | Freq: Two times a day (BID) | ORAL | Status: DC

## 2020-05-30 MED ORDER — BUDESONIDE 0.5 MG/2ML IN SUSP
0.5000 mg | Freq: Two times a day (BID) | RESPIRATORY_TRACT | Status: DC
Start: 2020-05-30 — End: 2020-06-20

## 2020-05-30 MED ORDER — VITAL 1.5 CAL PO LIQD
1000.0000 mL | ORAL | Status: DC
  Administered 2020-05-30: 1000 mL
  Filled 2020-05-30 (×2): qty 1000

## 2020-05-30 MED ORDER — APIXABAN 5 MG PO TABS
5.0000 mg | ORAL_TABLET | Freq: Two times a day (BID) | ORAL | Status: DC
Start: 2020-05-30 — End: 2020-06-20

## 2020-05-30 MED ORDER — ACETAMINOPHEN 160 MG/5ML PO SOLN
1000.0000 mg | Freq: Four times a day (QID) | ORAL | Status: DC | PRN
  Administered 2020-05-30 – 2020-06-06 (×8): 1000 mg
  Filled 2020-05-30 (×9): qty 40.6

## 2020-05-30 MED ORDER — CHOLESTYRAMINE 4 G PO PACK
4.0000 g | PACK | Freq: Every day | ORAL | Status: DC
Start: 2020-05-30 — End: 2020-06-20

## 2020-05-30 MED ORDER — OXYMETAZOLINE HCL 0.05 % NA SOLN
1.0000 | Freq: Two times a day (BID) | NASAL | Status: DC | PRN
  Filled 2020-05-30: qty 15

## 2020-05-30 MED ORDER — PROSOURCE TF PO LIQD
45.0000 mL | Freq: Three times a day (TID) | ORAL | Status: DC
  Administered 2020-05-30 – 2020-06-05 (×17): 45 mL
  Filled 2020-05-30 (×16): qty 45

## 2020-05-30 MED ORDER — METOPROLOL TARTRATE 25 MG/10 ML ORAL SUSPENSION
50.0000 mg | Freq: Two times a day (BID) | ORAL | Status: DC
Start: 2020-05-30 — End: 2020-06-20

## 2020-05-30 MED ORDER — IPRATROPIUM-ALBUTEROL 0.5-2.5 (3) MG/3ML IN SOLN
3.0000 mL | Freq: Four times a day (QID) | RESPIRATORY_TRACT | Status: DC | PRN
  Administered 2020-06-05: 3 mL via RESPIRATORY_TRACT
  Filled 2020-05-30 (×2): qty 3

## 2020-05-30 MED ORDER — BISACODYL 10 MG RE SUPP: 10.0000 mg | Freq: Every day | RECTAL | Status: DC | PRN

## 2020-05-30 MED ORDER — METOPROLOL TARTRATE 25 MG/10 ML ORAL SUSPENSION
50.0000 mg | Freq: Two times a day (BID) | ORAL | Status: DC
  Administered 2020-05-30 – 2020-06-06 (×14): 50 mg
  Filled 2020-05-30 (×14): qty 20

## 2020-05-30 MED ORDER — ARFORMOTEROL TARTRATE 15 MCG/2ML IN NEBU
15.0000 ug | INHALATION_SOLUTION | Freq: Two times a day (BID) | RESPIRATORY_TRACT | Status: DC
  Administered 2020-05-30 – 2020-06-19 (×29): 15 ug via RESPIRATORY_TRACT
  Filled 2020-05-30 (×38): qty 2

## 2020-05-30 MED ORDER — ENSURE ENLIVE PO LIQD
237.0000 mL | Freq: Two times a day (BID) | ORAL | Status: DC
  Administered 2020-05-31 – 2020-06-02 (×4): 237 mL

## 2020-05-30 MED ORDER — OXYMETAZOLINE HCL 0.05 % NA SOLN: 1.0000 | Freq: Two times a day (BID) | NASAL | Status: DC | PRN

## 2020-05-30 MED ORDER — GERHARDT'S BUTT CREAM: 1.0000 "application " | TOPICAL_CREAM | CUTANEOUS | Status: DC | PRN

## 2020-05-30 MED ORDER — ATORVASTATIN CALCIUM 10 MG PO TABS
10.0000 mg | ORAL_TABLET | Freq: Every day | ORAL | Status: DC
  Administered 2020-05-30 – 2020-06-05 (×7): 10 mg
  Filled 2020-05-30 (×7): qty 1

## 2020-05-30 MED ORDER — NUTRISOURCE FIBER PO PACK
1.0000 | PACK | Freq: Two times a day (BID) | ORAL | Status: DC
  Administered 2020-05-30 – 2020-06-06 (×14): 1
  Filled 2020-05-30 (×14): qty 1

## 2020-05-30 MED ORDER — CHOLESTYRAMINE 4 G PO PACK
4.0000 g | PACK | Freq: Every day | ORAL | Status: DC
  Administered 2020-05-31 – 2020-06-05 (×6): 4 g
  Filled 2020-05-30 (×7): qty 1

## 2020-05-30 MED ORDER — MENTHOL 3 MG MT LOZG: 1.0000 | LOZENGE | OROMUCOSAL | Status: DC | PRN

## 2020-05-30 MED ORDER — ORAL CARE MOUTH RINSE
15.0000 mL | Freq: Two times a day (BID) | OROMUCOSAL | Status: DC
Start: 2020-05-30 — End: 2020-06-20

## 2020-05-30 MED ORDER — ARFORMOTEROL TARTRATE 15 MCG/2ML IN NEBU
15.0000 ug | INHALATION_SOLUTION | Freq: Two times a day (BID) | RESPIRATORY_TRACT | Status: DC
Start: 2020-05-30 — End: 2020-06-20

## 2020-05-30 MED ORDER — ALUM & MAG HYDROXIDE-SIMETH 200-200-20 MG/5ML PO SUSP
15.0000 mL | ORAL | Status: DC | PRN
  Administered 2020-06-03: 30 mL
  Filled 2020-05-30: qty 30

## 2020-05-30 MED ORDER — BUDESONIDE 0.5 MG/2ML IN SUSP
0.5000 mg | Freq: Two times a day (BID) | RESPIRATORY_TRACT | Status: DC
  Administered 2020-05-30 – 2020-06-19 (×35): 0.5 mg via RESPIRATORY_TRACT
  Filled 2020-05-30 (×50): qty 2

## 2020-05-30 MED ORDER — ATORVASTATIN CALCIUM 10 MG PO TABS
10.0000 mg | ORAL_TABLET | Freq: Every day | ORAL | Status: DC
Start: 2020-05-30 — End: 2020-06-20

## 2020-05-30 MED ORDER — QUETIAPINE FUMARATE 25 MG PO TABS: 25.0000 mg | ORAL_TABLET | Freq: Every day | ORAL | Status: DC

## 2020-05-30 MED ORDER — APIXABAN 5 MG PO TABS
5.0000 mg | ORAL_TABLET | Freq: Two times a day (BID) | ORAL | Status: DC
  Administered 2020-05-30 – 2020-06-06 (×14): 5 mg
  Filled 2020-05-30 (×14): qty 1

## 2020-05-30 MED ORDER — ENSURE ENLIVE PO LIQD
237.0000 mL | Freq: Two times a day (BID) | ORAL | Status: DC
Start: 2020-05-30 — End: 2020-06-20

## 2020-05-30 MED ORDER — VITAL 1.5 CAL PO LIQD
1000.0000 mL | ORAL | Status: DC
Start: 2020-05-30 — End: 2020-06-20

## 2020-05-30 MED ORDER — QUETIAPINE FUMARATE 25 MG PO TABS
25.0000 mg | ORAL_TABLET | Freq: Every day | ORAL | Status: DC
Start: 2020-05-30 — End: 2020-06-20

## 2020-05-30 MED ORDER — PROSOURCE TF PO LIQD
45.0000 mL | Freq: Three times a day (TID) | ORAL | Status: DC
Start: 2020-05-30 — End: 2020-06-20

## 2020-05-30 NOTE — Progress Notes (Addendum)
  Progress Note    05/30/2020 7:10 AM 34 Days Post-Op  Subjective:  Sitting up in chair eating breakfast.    Afebrile HR 60's-100's NSR 89'Q-119'E systolic 17% TC  Vitals:   05/30/20 0336 05/30/20 0400  BP: (!) 110/55 (!) 105/51  Pulse: 71 65  Resp: (!) 24 (!) 21  Temp:    SpO2: 100% 100%    Physical Exam: Cardiac:  regular Lungs:  Non labored Extremities:  Bilateral feet warm and well perfused   CBC    Component Value Date/Time   WBC 6.3 05/30/2020 0035   RBC 3.01 (L) 05/30/2020 0035   HGB 9.2 (L) 05/30/2020 0035   HGB 12.1 (L) 08/20/2012 0610   HCT 30.2 (L) 05/30/2020 0035   HCT 44.0 08/02/2012 0853   PLT 241 05/30/2020 0035   PLT 158 08/20/2012 0610   MCV 100.3 (H) 05/30/2020 0035   MCV 99 08/02/2012 0853   MCH 30.6 05/30/2020 0035   MCHC 30.5 05/30/2020 0035   RDW 16.4 (H) 05/30/2020 0035   RDW 13.7 08/02/2012 0853   LYMPHSABS 0.7 05/19/2020 0056   MONOABS 0.9 05/19/2020 0056   EOSABS 0.5 05/19/2020 0056   BASOSABS 0.1 05/19/2020 0056    BMET    Component Value Date/Time   NA 138 05/30/2020 0035   NA 136 08/20/2012 0610   K 3.8 05/30/2020 0035   K 4.2 08/20/2012 0610   CL 107 05/30/2020 0035   CL 102 08/20/2012 0610   CO2 20 (L) 05/30/2020 0035   CO2 26 08/20/2012 0610   GLUCOSE 119 (H) 05/30/2020 0035   GLUCOSE 123 (H) 08/20/2012 0610   BUN 30 (H) 05/30/2020 0035   BUN 7 08/20/2012 0610   CREATININE 1.22 05/30/2020 0035   CREATININE 0.75 08/20/2012 0610   CALCIUM 9.0 05/30/2020 0035   CALCIUM 8.4 (L) 08/20/2012 0610   GFRNONAA 60 (L) 05/30/2020 0035   GFRNONAA >60 08/20/2012 0610   GFRAA >60 05/07/2017 0949   GFRAA >60 08/20/2012 0610    INR    Component Value Date/Time   INR 1.3 (H) 05/07/2020 0435   INR 1.0 08/02/2012 0853     Intake/Output Summary (Last 24 hours) at 05/30/2020 0710 Last data filed at 05/30/2020 0400 Gross per 24 hour  Intake 433.75 ml  Output 300 ml  Net 133.75 ml     Assessment:  79 y.o. 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  Bed available later today per RN.    Leontine Locket, PA-C [facility] [phone] 05/30/2020 7:10 AM  I have independently interviewed and examined patient and agree with PA assessment and plan above.  Plan for discharge to approval today.  Reinhard Schack C. Donzetta Matters, MD [facility] [place] Office: [phone] Pager: [phone]

## 2020-05-30 NOTE — TOC Transition Note (Signed)
Transition of Care [facility]) - CM/SW Discharge Note   Patient Details  Name: Carlos Olson MRN: [id number] Date of Birth: 04-12-1941  [facility]) CM/SW Contact:  Bethena Roys, RN Phone Number: 05/30/2020, 1:34 PM   Clinical Narrative:  Pt has been approved for CIR. Plan to transition today. No further needs from Case Manager at this time.   Final next level of care: IP Rehab Facility Barriers to Discharge: No Barriers Identified   Patient Goals and CMS Choice Patient states their goals for this hospitalization and ongoing recovery are:: to go to CIR   Choice offered to / list presented to : NA   Discharge Plan and Services   Discharge Planning Services: CM Consult Post Acute Care Choice: NA          DME Arranged: N/A DME Agency: NA     Readmission Risk Interventions No flowsheet data found.

## 2020-05-30 NOTE — Progress Notes (Signed)
Inpatient Rehabilitation Admissions Coordinator  I have Cir bed available to admit pt to today. I met at bedside with pt, daughter and Dr. Donzetta Matters. I have alerted acute team and TOC. I will make the arrangements to admit today.  Danne Baxter, RN, MSN Rehab Admissions Coordinator [phone] 05/30/2020 10:50 AM

## 2020-05-30 NOTE — Progress Notes (Signed)
Inpatient Rehabilitation Medication Review by a Pharmacist  A complete drug regimen review was completed for this patient to identify any potential clinically significant medication issues.  Clinically significant medication issues were identified:  no  Check AMION for pharmacist assigned to patient if future medication questions/issues arise during this admission.  Pharmacist comments:   Time spent performing this drug regimen review (minutes):  5   Kinzleigh Kandler 05/30/2020 7:04 PM

## 2020-05-30 NOTE — H&P (Signed)
Physical Medicine and Rehabilitation Admission H&P    HPI: Carlos Olson. 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  History taken from chart review and daughter due to processing.  Patient lives with spouse.  Independent prior to admission.  1 level home with level entry.  Daughter and son also very involved with support.  He presented on 04/17/2020 for elective AAA repair as well as right common femoral artery aneurysm repair per Dr. Donzetta Matters.  Postoperative developed hypotension requiring phenylephrine and cardiology service follow-up.  Most recent echocardiogram reviewed showing EF of 34% grade 1 diastolic dysfunction.  TEE completed showed ejection fraction of 50-55%. No wall motion abnormalities without thrombus.  Noted AKI with creatinine peak 2.7.  Renal ultrasound no hydronephrosis.  Nephrology services consulted for follow-up.  Patient developed abdominal pain concern for mesenteric ischemia gastroenterology services Dr. Wilfrid Lund consulted.  CT abdomen pelvis showed abdominal aortic repair some stranding noted no sizable hematoma seen.  There was noted near complete consolidation of right lower lobe small left pleural effusion seen with left basilar atelectasis.  Patient developed delirium placed on Seroquel felt to be multifactorial with cranial CT scan 04/23/2020 without acute changes.  Patient was ultimately placed on Eliquis for findings of atrial fibrillation.  A nasogastric tube was placed for nutritional support as well as hydration and renal function much improved latest creatinine 1.066.  Patient did require intubation for ventilatory support due to respiratory distress 05/04/2020 with tracheostomy performed 05/09/2020 per critical care  medicine [name] nurse practitioner as well as treated for MSSA PNA. He currently has a cuffless #6 Shiley trach tube in place.  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  There was consideration for endoscopy of which patient declined.  His diet has been advanced to mechanical soft thin liquid.  Hospital course further complicated by acute blood loss anemia, 9.6 monitored.  Therapy evaluations completed and patient was admitted for a comprehensive rehab program.  Please see preadmission assessment from earlier today as well.  Review of Systems  Constitutional: Positive for malaise/fatigue. Negative for chills and fever.  HENT: Negative for hearing loss.   Eyes: Negative for blurred vision and double vision.  Respiratory: Positive for shortness of breath.   Cardiovascular: Negative for chest pain and palpitations.  Gastrointestinal: Positive for abdominal pain. Negative for heartburn and nausea.  Genitourinary: Positive for urgency. Negative for dysuria, flank pain and hematuria.  Musculoskeletal: Positive for myalgias.  Skin: Negative for rash.  Neurological: Positive for weakness.  Psychiatric/Behavioral: The patient has insomnia.   All other systems reviewed and are negative.  Past Medical History:  Diagnosis Date  . AAA (abdominal aortic aneurysm) ([place])   . Atherosclerosis of aortic arch ([place]) 09/25/2015   By CT scan   . CAD (coronary artery disease) 09/25/2015   Mild 3v by CT scan   . Cancer [facility])    prostate  . Colon polyps   . COPD (chronic obstructive pulmonary disease) ([place])   . Diverticulosis of colon   . Femoral bruit    bilateral, found by Dr. Jefm Bryant  . Heart murmur  child  . History of prostate cancer   . IBS (irritable bowel  syndrome)    improved after retirement  . 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 Pelvic fracture ([place])   . Pneumonia    child  . Pulmonary nodules 09/25/2015   On screening CT scan, rpt 1 yr   . Shortness of breath    doe   Past Surgical History:  Procedure Laterality Date  . ANTERIOR CERVICAL DECOMP/DISCECTOMY FUSION  07/29/2011   Procedure: ANTERIOR CERVICAL DECOMPRESSION/DISCECTOMY FUSION 1 LEVEL;  Surgeon: Jessy Oto, MD;  Location: [place];  Service: Orthopedics;  Laterality: N/A;  Anterior Cervical Discectomy and Fusion C3-4  . AORTA - BILATERAL FEMORAL ARTERY BYPASS GRAFT N/A 04/17/2020   Procedure: AORTA BIFEMORAL BYPASS GRAFT USING A HEMASHIELD GOLD BIFURCATED GRAFT;  Surgeon: Waynetta Sandy, MD;  Location: [facility];  Service: Vascular;  Laterality: N/A;  . BUBBLE STUDY  04/26/2020   Procedure: BUBBLE STUDY;  Surgeon: Freada Bergeron, MD;  Location: [place];  Service: Cardiovascular;;  . CARDIOVERSION N/A 04/26/2020   Procedure: CARDIOVERSION;  Surgeon: Freada Bergeron, MD;  Location: [facility];  Service: Cardiovascular;  Laterality: N/A;  . CATARACT EXTRACTION W/PHACO Right 07/10/2015   Procedure: CATARACT EXTRACTION PHACO AND INTRAOCULAR LENS PLACEMENT ([place]);  Surgeon: Birder Robson, MD;  Location: [facility];  Service: Ophthalmology;  Laterality: Right;  [place] 00:58   . CATARACT EXTRACTION W/PHACO Left 07/24/2015   Procedure: CATARACT EXTRACTION PHACO AND INTRAOCULAR LENS PLACEMENT ([facility]);  Surgeon: Birder Robson, MD;  Location: [facility];  Service: Ophthalmology;  Laterality: Left;  [place] 00:38   . COLONOSCOPY  12/2007   [place], diverticulosis and polyp, rpt 5 yrs  . dental implants    . EMBOLIZATION Right 03/26/2020   Procedure: EMBOLIZATION;  Surgeon: Waynetta Sandy, MD;  Location: [place];  Service: Cardiovascular;  Laterality: Right;  . FORAMINOTOMY 2 LEVEL Left 10/2013   C4/5, C5/6  ([place])  . HERNIA REPAIR     right 05/1999, left 09/04/05  . JOINT REPLACEMENT Right    TKR  . KNEE ARTHROPLASTY Left 05/20/2017   Procedure: COMPUTER ASSISTED TOTAL KNEE ARTHROPLASTY;  Surgeon: Dereck Leep, MD;  Location: [facility];  Service: Orthopedics;  Laterality: Left;  . KNEE ARTHROSCOPY  09/1997   left  . POSTERIOR CERVICAL FUSION/FORAMINOTOMY N/A 11/22/2013   Procedure: LEFT C4-5 AND C5-6 FORAMINOTOMY;  Surgeon: Jessy Oto, MD;  Location: [place];  Service: Orthopedics;  Laterality: N/A;  . PROSTATECTOMY  08/1990  . 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 TEE WITHOUT CARDIOVERSION N/A 04/26/2020   Procedure: TRANSESOPHAGEAL ECHOCARDIOGRAM (TEE);  Surgeon: Freada Bergeron, MD;  Location: [facility];  Service: Cardiovascular;  Laterality: N/A;  . TONSILLECTOMY     Family History  Problem Relation Age of Onset  . Hypertension Mother   . Cancer Father        bone, prostate  . Diabetes Sister        x 40 years  . Cancer Brother        prostate   Social History:  reports that he quit smoking about 13 years ago. His smoking use included cigarettes. He has a 104.00 pack-year smoking history. He has never used smokeless tobacco. He reports current alcohol use of about 14.0 standard drinks of alcohol per week. He reports that he does not use drugs. Allergies:  Allergies  Allergen Reactions  . Levaquin [Levofloxacin] Other (  See Comments)    Aortic Aneurysm  . Dilaudid [Hydromorphone Hcl] Other (See Comments)    Oversedation  . Sulfasalazine Itching  . Sulfonamide Derivatives Itching   Medications Prior to Admission  Medication Sig Dispense Refill  . acetaminophen (TYLENOL) 500 MG tablet Take 1,000 mg by mouth every 6 (six) hours as needed for moderate pain or headache.     Marland Kitchen aspirin EC 81 MG tablet Take 1 tablet (81 mg total) by mouth daily.    . Fluticasone-Umeclidin-Vilant (TRELEGY ELLIPTA) 100-62.5-25 MCG/INH AEPB Inhale 1 puff into the lungs daily. 60 each 5  .  Multiple Vitamin (MULTIVITAMIN WITH MINERALS) TABS tablet Take 1 tablet by mouth daily. Multivitamin for Men    . albuterol (VENTOLIN HFA) 108 (90 Base) MCG/ACT inhaler Inhale 2 puffs into the lungs every 6 (six) hours as needed for wheezing or shortness of breath. 1 Inhaler 3    Drug Regimen Review Drug regimen was reviewed and remains appropriate with no significant issues identified  Home: Home Living Family/patient expects to be discharged to:: Private residence Living Arrangements: Spouse/significant other Available Help at Discharge: Family, Available 24 hours/day Type of Home: House Home Access: Level entry Home Layout: One level Bathroom Shower/Tub: Multimedia programmer: Handicapped height Bathroom Accessibility: Yes Home Equipment: Environmental consultant - 2 wheels, Grab bars - tub/shower, Financial controller: Secondary school teacher, Sock aid  Lives With: Spouse, Daughter, Other (Comment) (daughter and son in law very involved)   Functional History: Prior Function Level of Independence: Independent  Functional Status:  Mobility: Bed Mobility Overal bed mobility: Needs Assistance Bed Mobility: Sit to Supine Rolling: Mod assist Sidelying to sit: +2 for physical assistance, Mod assist Supine to sit: Mod assist, +2 for physical assistance, HOB elevated Sit to supine: Mod assist General bed mobility comments: Mod A for manading BLEs Transfers Overall transfer level: Needs assistance Equipment used:  [place] walker) Transfer via Lift Equipment: Stedy Transfers: Sit to/from Stand Sit to Stand: Mod assist, Min assist Stand pivot transfers: Total assist, +2 safety/equipment General transfer comment: Mod A for lower surface at recliner. Requiring assist to initiate and power up.  Ambulation/Gait Ambulation/Gait assistance: Min assist, +2 safety/equipment Gait Distance (Feet): 10 Feet Assistive device:  (Eva walker) Gait Pattern/deviations: Step-through pattern, Decreased  stride length, Decreased step length - right, Decreased step length - left, Shuffle, Drifts right/left, Trunk flexed General Gait Details: Pt was able to take a few steps with [place] Pier walker and pt then asked to sit.  He stated, "It is weakness but also i worry because i am having a BM."    Pt did ambulate well with Harmon Pier walker.   Gait velocity interpretation: <1.31 ft/sec, indicative of household ambulator    ADL: ADL Overall ADL's : Needs assistance/impaired Eating/Feeding: Set up, Sitting Eating/Feeding Details (indicate cue type and reason): jello Grooming: Wash/dry face, Minimal assistance, Sitting, Min guard Grooming Details (indicate cue type and reason): While sitting on stedy at the sink, pt washing his face with Min Guard-Min A for safety and balance.  Upper Body Bathing: Moderate assistance, Sitting Lower Body Bathing: Total assistance, Sitting/lateral leans Upper Body Dressing : Maximal assistance, Sitting Upper Body Dressing Details (indicate cue type and reason): changed soiled gown Lower Body Dressing: Total assistance, Sitting/lateral leans Lower Body Dressing Details (indicate cue type and reason): don and doff socks Toilet Transfer: Minimal assistance, Moderate assistance (sara stedy) Toilet Transfer Details (indicate cue type and reason): Mod A for lower surface at recliner. Requiring assist to initiate and power  up.  Toileting- Clothing Manipulation and Hygiene: Total assistance, +2 for safety/equipment, Sit to/from stand Functional mobility during ADLs: Minimal assistance, Moderate assistance (sit<>stand with stedy) General ADL Comments: Pt performing sit<>stand at stedy with MIn-Mod A. Focused on challenging sitting balance and activity tolerance. Pt performing grooming task at sink with MInGUard-Min A. Pt very motivated.  Cognition: Cognition Overall Cognitive Status: Impaired/Different from baseline Orientation Level: Oriented to person, Oriented to place, Disoriented to  time, Disoriented to situation Cognition Arousal/Alertness: Awake/alert Behavior During Therapy: WFL for tasks assessed/performed Overall Cognitive Status: Impaired/Different from baseline Area of Impairment: Attention, Memory, Following commands, Safety/judgement, Awareness, Problem solving Current Attention Level: Sustained Memory: Decreased short-term memory Following Commands: Follows one step commands with increased time Safety/Judgement: Decreased awareness of safety, Decreased awareness of deficits Awareness: Intellectual, Emergent Problem Solving: Slow processing, Decreased initiation, Difficulty sequencing, Requires verbal cues General Comments: Continues to present with decreased awareness, problem solving, attention, and memory. During conversation, pt can be tangiental and off topic. Pt following simple cues with calm environment.  Difficult to assess due to: Tracheostomy  Physical Exam: Blood pressure (!) 105/51, pulse 65, temperature 99.2 F (37.3 C), temperature source Axillary, resp. rate (!) 21, height 6\' 3"  (1.905 m), weight 86.3 kg, SpO2 100 %. Physical Exam Vitals reviewed.  Constitutional:      General: He is not in acute distress.    Appearance: He is not ill-appearing.  HENT:     Head: Normocephalic and atraumatic.     Nose: Nose normal.     Comments: + NG Eyes:     General:        Right eye: No discharge.        Left eye: No discharge.     Extraocular Movements: Extraocular movements intact.  Neck:     Comments: Tracheostomy tube in place with PMV Cardiovascular:     Rate and Rhythm: Normal rate and regular rhythm.  Pulmonary:     Effort: Pulmonary effort is normal. No respiratory distress.     Breath sounds: No stridor.  Abdominal:     General: Bowel sounds are normal.     Palpations: Abdomen is soft.  Genitourinary:    Comments: + Rectal tube Musculoskeletal:     Comments: No edema or tenderness in extremities  Skin:    General: Skin is warm  and dry.  Neurological:     Mental Status: He is alert.     Comments: Alert and oriented x2 with increased time Makes eye contact with examiner and does follow simple commands.   Motor: Grossly 4+/5 throughout Sensation intact light touch  Psychiatric:        Speech: Speech is delayed.        Cognition and Memory: Cognition is impaired.     Results for orders placed or performed during the hospital encounter of 04/17/20 (from the past 48 hour(s))  CBC     Status: Abnormal   Collection Time: 05/30/20 12:35 AM  Result Value Ref Range   WBC 6.3 4.0 - 10.5 K/uL   RBC 3.01 (L) 4.22 - 5.81 MIL/uL   Hemoglobin 9.2 (L) 13.0 - 17.0 g/dL   HCT 30.2 (L) 39 - 52 %   MCV 100.3 (H) 80.0 - 100.0 fL   MCH 30.6 26.0 - 34.0 pg   MCHC 30.5 30.0 - 36.0 g/dL   RDW 16.4 (H) 11.5 - 15.5 %   Platelets 241 150 - 400 K/uL   nRBC 0.0 0.0 - 0.2 %  Comment: Performed at [facility], [place] [address], [place], [place] [address]  Basic metabolic panel     Status: Abnormal   Collection Time: 05/30/20 12:35 AM  Result Value Ref Range   Sodium 138 135 - 145 mmol/L   Potassium 3.8 3.5 - 5.1 mmol/L   Chloride 107 98 - 111 mmol/L   CO2 20 (L) 22 - 32 mmol/L   Glucose, Bld 119 (H) 70 - 99 mg/dL    Comment: Glucose reference range applies only to samples taken after fasting for at least 8 hours.   BUN 30 (H) 8 - 23 mg/dL   Creatinine, Ser 1.22 0.61 - 1.24 mg/dL   Calcium 9.0 8.9 - 10.3 mg/dL   GFR, Estimated 60 (L) >60 mL/min    Comment: (NOTE) Calculated using the CKD-EPI Creatinine Equation (2021)    Anion gap 11 5 - 15    Comment: Performed at [facility] [address], [place], [place] [address]  Magnesium     Status: None   Collection Time: 05/30/20 12:35 AM  Result Value Ref Range   Magnesium 1.9 1.7 - 2.4 mg/dL    Comment: Performed at [facility], [place] [address] [place], [place] [address]   No results found.     Medical Problem List and Plan: 1.  Debility secondary  to AAA repair 58/52/7782 complicated by acute respiratory failure/MSSA pneumonia  -patient may not shower  -ELOS/Goals: Supervision/Mod I 13-17 days  Admit to CIR 2.  Antithrombotics: -DVT/anticoagulation: Eliquis  -antiplatelet therapy: N/A 3. Pain Management: Lidoderm patch only.  PATIENT IS TO HAVE NO OTHER NARCOTICS DUE TO BOUTS OF DELERIUM AND CONFUSION RELATED TO PAIN MEDS.  Monitor with increased exertion 4. Mood: Provide emotional support  -antipsychotic agents: Seroquel 25 mg nightly 5. Neuropsych: This patient is not fully capable of making decisions on his own behalf. 6. Skin/Wound Care: Routine skin checks 7. Fluids/Electrolytes/Nutrition: Routine in and outs.  CMP ordered 8.  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  Currently #6 cuffless Shiley in place.  Follow-up per critical care medicine  Continue to monitor and consider capping trials. 9.  AKI.  Improved.  Renal ultrasound no hydronephrosis.    CMP ordered for tomorrow. 10.  Hypotension.    Monitor with increased mobility. 11.  Atrial fibrillation.  Lopressor 50 mg twice daily, continue Eliquis.  Cardiac rate controlled.  Follow-up cardiology services as needed  Monitor with increased activity. 12.  Acute on chronic anemia.     CBC ordered for tomorrow a.m.   13.  COPD with remote history of tobacco abuse.  Continue nebulizers as directed 14.  Hyperlipidemia.  Lipitor 15.  Prostate cancer with prostatectomy 1992.  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  Follow-up urology as well as gastroenterology services.  Patient currently declined endoscopy. 17. Decreased nutritional storage. Patient presently on dysphagia #3 thin liquid diet. Supplemental tube feeds. Dietary follow-up  Cathlyn Parsons, PA-C 05/30/2020  I have personally performed a face to face diagnostic evaluation, including, but not limited to relevant history and physical exam findings, of this patient and developed relevant  assessment and plan.  Additionally, I have reviewed and concur with the physician assistant's documentation above.  Delice Lesch, MD, ABPMR  The patient's status has not changed. Any changes from the pre-admission screening or documentation from the acute chart are noted above.   Delice Lesch, MD, ABPMR

## 2020-05-30 NOTE — Progress Notes (Signed)
Pt admitted to [phone]. Pt has trach equipment at bedside and vitals are stable.

## 2020-05-30 NOTE — Progress Notes (Signed)
Physical Therapy Treatment Patient Details Name: Carlos Olson MRN: [id number] DOB: 14-Feb-1941 Today's Date: 05/30/2020    History of Present Illness 79 year old male who presented 10/18 for elective endovascular repair abdominal aortic aneurysm. Hospital course complicated by AKI, metabolic encephalopathy, acute hypercarbic respiratory failure, hypoxia/ RLL pneumonia, and hypotension. Treated in ICU for sepsis, AFRVR, underwent cardioversion, ABX course. SLP ordered 10/28 given pt coughing with PO liquid intake. MBS 10/29: mild oropharyngeal dysphagia characterized by reduced bolus propulsion, reduced lingual retraction, reduced pharyngeal constriction, and reduced anterior laryngeal movement. Recommended dys 3 diet with thin liquid. Pt became obtunded on 11/2 and was transferred back to ICU. Pt made NPO and Cortrak placed on 11/3, SLP recommended pt remain NPO with water protocol. Pt reintubated 11/5-11/8. CXR 11/9 consistent with previous small bilateral pleural effusions and right lung base aeration.     PT Comments    Pt had just gotten back to bed after sitting up for a couple of hours this morning due to back pain, however agreeable to working with therapy as he had just received pain medication. Pt is making good progress towards his goals and is looking forward to discharge to CIR later today for more intensive therapy. Pt continues to be limited in safe mobility by decreased cognition, generalized weakness and chronic low back pain. Pt currently requires modAx2 for bed mobility, and min Ax2 for power up to [place]. Once in standing pt able to work on static balance and marching in place before return to bed. Pt and family very appreciative of Acute therapy and looking forward to CIR.     Follow Up Recommendations  CIR;Supervision/Assistance - 24 hour     Equipment Recommendations  3in1 (PT)       Precautions / Restrictions Precautions Precautions: Fall Precaution Comments: trach,  flexiseal Restrictions Weight Bearing Restrictions: No    Mobility  Bed Mobility Overal bed mobility: Needs Assistance Bed Mobility: Supine to Sit Rolling: Min assist Sidelying to sit: Mod assist;+2 for physical assistance   Sit to supine: Mod assist   General bed mobility comments: modAx2 for management of LE off bed and for trunk to upright pt able to initiate pushing up, with return to bed pt requires mod A for bringing LE back into bed   Transfers Overall transfer level: Needs assistance   Transfers: Sit to/from Stand Sit to Stand: +2 physical assistance;From elevated surface;Min assist;Min guard         General transfer comment: pt able to come to initial standing with minAx2, vc for chest upright and anterior pelvic tilt, min guard for second attempt from higher Stedy pads and min A for last standing due to back pain       Balance Overall balance assessment: Needs assistance Sitting-balance support: Feet supported;Bilateral upper extremity supported Sitting balance-Leahy Scale: Fair Sitting balance - Comments: able to maintain balance   Standing balance support: Bilateral upper extremity supported Standing balance-Leahy Scale: Poor Standing balance comment: able to maintain standing balance with UE support for 1.5 min without assist                             Cognition Arousal/Alertness: Awake/alert Behavior During Therapy: WFL for tasks assessed/performed Overall Cognitive Status: Impaired/Different from baseline Area of Impairment: Attention;Memory;Following commands;Safety/judgement;Awareness;Problem solving                   Current Attention Level: Sustained Memory: Decreased short-term memory Following Commands: Follows one step  commands with increased time Safety/Judgement: Decreased awareness of safety;Decreased awareness of deficits Awareness: Intellectual;Emergent Problem Solving: Slow processing;Decreased initiation;Difficulty  sequencing;Requires verbal cues General Comments: progressing however continues to need direction back to task at hand but able to consistently follow cues      Exercises General Exercises - Lower Extremity Hip Flexion/Marching: AROM;5 reps;Standing (marching in [place]) Other Exercises Other Exercises: static standing 1.5 min     General Comments General comments (skin integrity, edema, etc.): VSS on RA      Pertinent Vitals/Pain Pain Assessment: Faces Faces Pain Scale: Hurts even more Pain Location: low back  Pain Descriptors / Indicators: Aching;Sore;Grimacing;Guarding Pain Intervention(s): Limited activity within patient's tolerance;Monitored during session;Repositioned;Premedicated before session           PT Goals (current goals can now be found in the care plan section) Acute Rehab PT Goals Patient Stated Goal: to go home PT Goal Formulation: With patient Time For Goal Achievement: 06/06/20 Potential to Achieve Goals: Good Progress towards PT goals: Progressing toward goals    Frequency    Min 3X/week      PT Plan Current plan remains appropriate       AM-PAC PT "6 Clicks" Mobility   Outcome Measure  Help needed turning from your back to your side while in a flat bed without using bedrails?: A Lot Help needed moving from lying on your back to sitting on the side of a flat bed without using bedrails?: A Lot Help needed moving to and from a bed to a chair (including a wheelchair)?: A Lot Help needed standing up from a chair using your arms (e.g., wheelchair or bedside chair)?: Total Help needed to walk in hospital room?: Total Help needed climbing 3-5 steps with a railing? : Total 6 Click Score: 9    End of Session Equipment Utilized During Treatment: Gait belt Activity Tolerance: Patient tolerated treatment well Patient left: with call bell/phone within reach;with family/visitor present;with bed alarm set;in bed Nurse Communication: Mobility status;Need  for lift equipment PT Visit Diagnosis: Muscle weakness (generalized) (M62.81);Pain Pain - Right/Left: Left Pain - part of body: Shoulder     Time: 0981-1914 PT Time Calculation (min) (ACUTE ONLY): 23 min  Charges:  $Therapeutic Exercise: 8-22 mins $Therapeutic Activity: 8-22 mins                     Audrionna Lampton B. Migdalia Dk PT, DPT Acute Rehabilitation Services Pager [phone] Office [phone]    [place] 05/30/2020, 9:19 AM

## 2020-05-30 NOTE — Progress Notes (Signed)
NAME:  Carlos Olson, MRN:  [id number], DOB:  1941-01-21, LOS: 37 ADMISSION DATE:  04/17/2020, CONSULTATION DATE: 10/21 REFERRING MD: Dr. Donzetta Matters, CHIEF COMPLAINT: AKI with mild hypoxia  Brief History   79 year old male who presented 10/18 for elective endovascular repair abdominal aortic aneurysm with Dr. Donzetta Matters. PCCM consulted 10/21 for assistance in medical management.  Hospital course complicated by AKI, metabolic encephalopathy, hypoxia/ RLL pneumonia, and hypotension. Treated in ICU for sepsis, AFRVR, underwent cardioversion, ABX course. Moved out of ICU. Ongoing course significant for AKI, AF, debility. Then 11/2 he became encephalopathic overnight and was transferred back to ICU. PCCM re-consulted.  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  Past Medical History  AAA History of prostate cancer Heart murmur Diverticulitis COPD CAD atherosclerosis  Significant Hospital Events   Admitted 10/18 for elective AAA repair 10/19 OR  11/15 fever while on cefazolin, changed to ceftriaxone  Consults:  Cardiology PCCM Nephrology GI  Procedures:  Endovascular AAA repair 10/19 Tracheostomy tube placement 11/10  Significant Diagnostic Tests:  04/21/2020 CT A/P > Status post abdominal aortic repair with aortobifemoral bypass graft. Some stranding is noted in the operative site likely related to postoperative change. No sizable hematoma is seen. Diverticulosis without diverticulitis. Near complete consolidation of the right lower lobe. Small left pleural effusion is seen with left basilar atelectasis. 10/25 CTH > 1. Stable.  No acute intracranial abnormality. 2. Atrophy with chronic small vessel white matter ischemic disease.  Micro Data:  Blood 10/28 > negative Sputum 10/28 > few candida Tracheal aspirate 11/12-MSSA 11/15 urine-E.Coli>>  pansensitive  Antimicrobials:  10/19 cefazolin  10/22 zosyn > 10/26 Vancomycin 10/29 > 11/2  Zosyn 10/29 > 11/3 Fluconazole 10/29 > Zyvox 11/12 Ceftriaxone 11/15 >  Interim history/subjective:  Leaving for CIR today.  Objective   Blood pressure 126/68, pulse 78, temperature 98.2 F (36.8 C), resp. rate 19, height 6\' 3"  (1.905 m), weight 86.3 kg, SpO2 100 %.    FiO2 (%):  [21 %-28 %] 28 %   Intake/Output Summary (Last 24 hours) at 05/30/2020 1700 Last data filed at 05/30/2020 0700 Gross per 24 hour  Intake 658.75 ml  Output 700 ml  Net -41.25 ml   Filed Weights   05/28/20 0500 05/29/20 0700 05/30/20 0428  Weight: 87.3 kg 86.6 kg 86.3 kg   General:  Elderly man sitting up in bed in NAD HEENT: [place]/AT, eyes anicteric, oral mucosa moist. NGT in place. Neuro: awake, answering questions appropriately, occasionally making nonsensical comments. Moving all extremities, globally weak. Neck: trach in place, no bleeding or secretions CV: S1S2, RRR PULM:  breathing comfortably on RA, CTAB, strong voice GI: soft, NT, ND Extremities: no peripheral edema, no cyanosis Skin: no rashes   Resolved Hospital Problem list   Hypotension due to hypovolemia  RLL pneumonia Acute metabolic and toxic encephalopathy -Avoid all narcotics MSSA pneumonia  Assessment & Plan:   Acute hypercarbic respiratory failure requiring tracheostomy for prolonged MV -Now chronic failure in the setting of critical illness -Status post tracheostomy 11/10 -HX COPD without acute exacerbation P: -con't [place]iley #6 cuffless and PMV use. Planning to decannulate as an OP after discharge from CIR -con't routine trach care -As needed bronchodilators -Encourage pulmonary hygiene -OOB mobility as much as able   Periodic delirium with sundowning -Improved with Seroquel -family understands this may worsen transiently with change in location to [place]; please avoid  opiates  Colovesical fistula  -etiology unclear, previous history of radiation for prostate cancer and diverticulosis. - Evaluated by GI and urology, no intervention to be made at this time P: -Completed 10 days of ceftriaxone on 11/25 -Continue Foley  Atrial fibrillation - Will need OP imaging  follow up to monitor for amio lung toxicity ( previous diagnosis of COPD)>> Followed by Dr. Lamonte Sakai  P: -Continue Metroprolol -Continue apixaban -con't telemetry while admitted  Anemia  P: Hemoglobin continues to remain stable with no acute signs of bleeding -transfuse for Hb <7   History of pulmonary nodule noted 02/2020 -Plan to follow as an outpatient -Pt is due for 3 month follow up 05/2020 P: -follow up imaging as OP at follow up with Dr. Lamonte Sakai  Recent repair of abdominal aortic aneurysm P: -Management per vascular surgery  Malnutrition P: -Transition to nocturnal feeds to allow for daytime oral intake -Continue to work with SLP      Best practice:  Diet: Continue enteral nutrition TF and oral feeds.  Pain/Anxiety/Delirium protocol (if indicated: Xanax decreased): Seroquel VAP protocol (if indicated): n/a DVT prophylaxis: Eliquis GI prophylaxis: No longer indicated Glucose control: euglycemic on no coverage Mobility: PT as tolerated Code Status: Full Family Communication: Per primary Disposition:  CIR today     Julian Hy, DO 05/30/20 5:08 PM [facility]

## 2020-05-30 NOTE — Progress Notes (Signed)
Jamse Arn, MD  Physician  Physical Medicine and Rehabilitation  PMR Pre-admission     Signed  Date of Service:  05/28/2020  3:30 PM      Related encounter: Admission (Current) from 04/17/2020 in [place] ICU      Signed       Show:Clear all [x] Manual[x] Template[] Copied  Added by: [x] Cristina Gong, RN[x] Jamse Arn, MD  [] Hover for details PMR Admission Coordinator Pre-Admission Assessment   Patient: Carlos Olson is an 79 y.o., male MRN: [id number] DOB: 1940/09/02 Height: 6' 3"  (190.5 cm) Weight: 86.3 kg   Insurance Information HMO:     PPO: yes     PCP:      IPA:      80/20:      OTHER:  PRIMARY: [employer] Medicare      Policy#: [id number]      Subscriber: pt CM Name: Nira Conn      Phone#: Beckie Busing is follow up with phone [phone]     Fax#: [phone] Pre-Cert#: [id number] approved until 12/6 when updates are due      Employer:  Benefits:  Phone #: [phone]     Name: 11/29 Eff. Date: 07/01/2019     Deduct: none      Out of Pocket Max: $5000 CIR: $295 co pay per day days 1 until 6      SNF: no copay days 1 until 20; $184 co pay per day days 21 until 100 Outpatient: $35 per visit     Co-Pay: visits per medical necessity Home Health: 100%      Co-Pay: visits per medical neccesity DME: 80%     Co-Pay: 20% Providers: in network  SECONDARY: none   Financial Counselor:       Phone#:    [employer]" for patients in Inpatient Rehabilitation Facilities with attached "Privacy Act [place] Records" was provided and verbally reviewed with: Family   Emergency Contact Information         Contact Information     Name Relation Home Work [place] Spouse [phone]   [phone]    [place] Daughter [phone]   [phone]         Current Medical History  Patient Admitting Diagnosis: Debility s/p AAA repair   History of Present Illness: 79 year old male with past  medical history of AAA, prostate cancer, heart murmur, diverticulitis, IBS, osteoarthritis of bilateral knees,  ACDF, COPD, CAD, and atherosclerosis presented on 04/17/2020 for elective abdominal aortic aneurysm repair.   Hospital course complicate by AKI, metabolic encephalopathy, hypoxia//RLL pneumonia and hypotension. Treated for sepsis, AF RVR and underwent cardioversion. Moved out of ICU and became encephalopathic overnight and transferred back to ICU. Avoid all narcotics recommended. Further course with prolonged mechanical ventilation requiring Trach on 05/09/2020 and MSSA pneumonia. 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 Recommended to manage with catheter drainage. Etiology unclear, previous history of radiation for prostate cancer and diverticulosis. GI also recommends no intervention at this time.  Tolerating RA and PMSV with cuff less #6 trach. 28 % trach collar at night Periodic delirium with sundowning improved with Seroquel.    Patient's medical record from [facility]  has been reviewed by the rehabilitation admission coordinator and physician.   Past Medical History      Past Medical History:  Diagnosis Date  . AAA (abdominal aortic aneurysm) ([place])    . Atherosclerosis of aortic arch ([place])  09/25/2015    By CT scan   . CAD (coronary artery disease) 09/25/2015    Mild 3v by CT scan   . Cancer [facility])      prostate  . Colon polyps    . COPD (chronic obstructive pulmonary disease) ([place])    . Diverticulosis of colon    . Femoral bruit      bilateral, found by Dr. Jefm Bryant  . Heart murmur      child  . History of prostate cancer    . IBS (irritable bowel syndrome)      improved after retirement  . Osteoarthritis      knees and cervical/lumbar spine s/p surgeries and injections  . Pelvic fracture ([place])    . Pneumonia      child  . Pulmonary nodules 09/25/2015    On screening CT scan, rpt 1 yr   . Shortness of breath       doe      Family History   family history includes Cancer in his brother and father; Diabetes in his sister; Hypertension in his mother.   Prior Rehab/Hospitalizations Has the patient had prior rehab or hospitalizations prior to admission? Yes   Has the patient had major surgery during 100 days prior to admission? Yes              Current Medications   Current Facility-Administered Medications:  .  0.9 %  sodium chloride infusion, 250 mL, Intravenous, Continuous, Angelia Mould, MD, Stopped at 05/24/20 1312 .  acetaminophen (TYLENOL) 160 MG/5ML solution 1,000 mg, 1,000 mg, Per Tube, Q6H PRN, Waynetta Sandy, MD, 1,000 mg at 05/30/20 2820 .  alum & mag hydroxide-simeth (MAALOX/MYLANTA) 200-200-20 MG/5ML suspension 15-30 mL, 15-30 mL, Per Tube, Q2H PRN, Waynetta Sandy, MD .  apixaban [facility]) tablet 5 mg, 5 mg, Per Tube, BID, Rigoberto Noel, MD, 5 mg at 05/30/20 0811 .  arformoterol (BROVANA) nebulizer solution 15 mcg, 15 mcg, Nebulization, BID, Rigoberto Noel, MD, 15 mcg at 05/30/20 0802 .  atorvastatin (LIPITOR) tablet 10 mg, 10 mg, Per Tube, QHS, Einar Grad, RPH, 10 mg at 05/29/20 2109 .  bisacodyl (DULCOLAX) suppository 10 mg, 10 mg, Rectal, Daily PRN, Angelia Mould, MD .  budesonide (PULMICORT) nebulizer solution 0.5 mg, 0.5 mg, Nebulization, BID, Rigoberto Noel, MD, 0.5 mg at 05/30/20 0802 .  chlorhexidine (PERIDEX) 0.12 % solution 15 mL, 15 mL, Mouth Rinse, BID, Waynetta Sandy, MD, 15 mL at 05/30/20 0809 .  Chlorhexidine Gluconate Cloth 2 % PADS 6 each, 6 each, Topical, Daily, Angelia Mould, MD, 6 each at 05/29/20 1000 .  cholestyramine (QUESTRAN) packet 4 g, 4 g, Per Tube, Daily, Llana Aliment, RPH, 4 g at 05/30/20 1106 .  feeding supplement (ENSURE ENLIVE / ENSURE PLUS) liquid 237 mL, 237 mL, Per Tube, BID BM, Llana Aliment, RPH, 237 mL at 05/29/20 1342 .  feeding supplement (PROSource TF) liquid 45 mL, 45 mL, Per  Tube, TID, Corey Harold, NP, 45 mL at 05/30/20 0809 .  feeding supplement (VITAL 1.5 CAL) liquid 1,000 mL, 1,000 mL, Per Tube, Q24H, Kipp Brood, MD, Stopped at 05/30/20 0701 .  fiber (NUTRISOURCE FIBER) 1 packet, 1 packet, Per Tube, BID, Kipp Brood, MD, 1 packet at 05/30/20 0809 .  Gerhardt's butt cream, , Topical, PRN, Angelia Mould, MD, 1 application at 60/15/61 1535 .  ipratropium-albuterol (DUONEB) 0.5-2.5 (3) MG/3ML nebulizer solution 3 mL, 3 mL, Nebulization, Q6H PRN,  Rigoberto Noel, MD .  lidocaine (LIDODERM) 5 % 1 patch, 1 patch, Transdermal, Q24H, Kipp Brood, MD, 1 patch at 05/30/20 0809 .  lidocaine (XYLOCAINE) 2 % jelly 1 application, 1 application, Urethral, Once, Franchot Gallo, MD .  MEDLINE mouth rinse, 15 mL, Mouth Rinse, q12n4p, Waynetta Sandy, MD, 15 mL at 05/30/20 1107 .  menthol-cetylpyridinium (CEPACOL) lozenge 3 mg, 1 lozenge, Oral, Q3H PRN, Kipp Brood, MD, 3 mg at 05/27/20 1650 .  metoprolol tartrate (LOPRESSOR) 25 mg/10 mL oral suspension 50 mg, 50 mg, Per Tube, BID, Waynetta Sandy, MD, 50 mg at 05/30/20 0810 .  naloxone [facility]) injection 0.4 mg, 0.4 mg, Intravenous, PRN, Waynetta Sandy, MD, 0.4 mg at 05/01/20 0902 .  ondansetron (ZOFRAN) injection 4 mg, 4 mg, Intravenous, Q6H PRN, Angelia Mould, MD .  oxymetazoline (AFRIN) 0.05 % nasal spray 1 spray, 1 spray, Each Nare, BID PRN, Julian Hy, DO .  QUEtiapine (SEROQUEL) tablet 25 mg, 25 mg, Oral, QHS, Agarwala, Ravi, MD, 25 mg at 05/29/20 2109   Patients Current Diet:     Diet Order                      DIET DYS 3 Room service appropriate? Yes; Fluid consistency: Thin  Diet effective now                      Precautions / Restrictions Precautions Precautions: Fall Precaution Comments: trach, flexiseal Restrictions Weight Bearing Restrictions: No    Has the patient had 2 or more falls or a fall with injury in the past year? No     Prior Activity Level Limited Community (1-2x/wk): Independent Community (5-7x/wk): very active walking and doing exercises pta   Prior Functional Level Self Care: Did the patient need help bathing, dressing, using the toilet or eating? Independent   Indoor Mobility: Did the patient need assistance with walking from room to room (with or without device)? Independent   Stairs: Did the patient need assistance with internal or external stairs (with or without device)? Independent   Functional Cognition: Did the patient need help planning regular tasks such as shopping or remembering to take medications? [place] / Equipment Home Assistive Devices/Equipment: Eyeglasses, Dentures (specify type), Walker (specify type), Cane (specify quad or straight) Home Equipment: Walker - 2 wheels, Grab bars - tub/shower, Adaptive equipment   Prior Device Use: Indicate devices/aids used by the patient prior to current illness, exacerbation or injury? None of the above   Current Functional Level Cognition   Overall Cognitive Status: Impaired/Different from baseline Difficult to assess due to: Tracheostomy Current Attention Level: Sustained Orientation Level: Oriented to person, Oriented to place, Oriented to situation, Disoriented to time Following Commands: Follows one step commands with increased time Safety/Judgement: Decreased awareness of safety, Decreased awareness of deficits General Comments: progressing however continues to need direction back to task at hand but able to consistently follow cues    Extremity Assessment (includes Sensation/Coordination)   Upper Extremity Assessment: LUE deficits/detail LUE Deficits / Details: Limited AROM at L shoulder. Increased edema.  LUE Coordination: decreased gross motor  Lower Extremity Assessment: Defer to PT evaluation     ADLs   Overall ADL's : Needs assistance/impaired Eating/Feeding: Set up, Sitting Eating/Feeding  Details (indicate cue type and reason): jello Grooming: Wash/dry face, Minimal assistance, Sitting, Min guard Grooming Details (indicate cue type and reason): While sitting on stedy at the sink, pt  washing his face with Min Guard-Min A for safety and balance.  Upper Body Bathing: Moderate assistance, Sitting Lower Body Bathing: Total assistance, Sitting/lateral leans Upper Body Dressing : Maximal assistance, Sitting Upper Body Dressing Details (indicate cue type and reason): changed soiled gown Lower Body Dressing: Total assistance, Sitting/lateral leans Lower Body Dressing Details (indicate cue type and reason): don and doff socks Toilet Transfer: Minimal assistance, Moderate assistance (sara stedy) Toilet Transfer Details (indicate cue type and reason): Mod A for lower surface at recliner. Requiring assist to initiate and power up.  Toileting- Clothing Manipulation and Hygiene: Total assistance, +2 for safety/equipment, Sit to/from stand Functional mobility during ADLs: Minimal assistance, Moderate assistance (sit<>stand with stedy) General ADL Comments: Pt performing sit<>stand at stedy with MIn-Mod A. Focused on challenging sitting balance and activity tolerance. Pt performing grooming task at sink with MInGUard-Min A. Pt very motivated.     Mobility   Overal bed mobility: Needs Assistance Bed Mobility: Supine to Sit Rolling: Min assist Sidelying to sit: Mod assist, +2 for physical assistance Supine to sit: Mod assist, +2 for physical assistance, HOB elevated Sit to supine: Mod assist General bed mobility comments: modAx2 for management of LE off bed and for trunk to upright pt able to initiate pushing up, with return to bed pt requires mod A for bringing LE back into bed      Transfers   Overall transfer level: Needs assistance Equipment used:  [place] walker) Transfer via Lift Equipment: Stedy Transfers: Sit to/from Stand Sit to Stand: +2 physical assistance, From elevated surface,  Min assist, Min guard Stand pivot transfers: Total assist, +2 safety/equipment General transfer comment: pt able to come to initial standing with minAx2, vc for chest upright and anterior pelvic tilt, min guard for second attempt from higher Stedy pads and min A for last standing due to back pain      Ambulation / Gait / Stairs / Wheelchair Mobility   Ambulation/Gait Ambulation/Gait assistance: Min assist, +2 safety/equipment Gait Distance (Feet): 10 Feet Assistive device:  (Eva walker) Gait Pattern/deviations: Step-through pattern, Decreased stride length, Decreased step length - right, Decreased step length - left, Shuffle, Drifts right/left, Trunk flexed General Gait Details: Pt was able to take a few steps with Harmon Pier walker and pt then asked to sit.  He stated, "It is weakness but also i worry because i am having a BM."    Pt did ambulate well with Harmon Pier walker.   Gait velocity interpretation: <1.31 ft/sec, indicative of household ambulator     Posture / Balance Dynamic Sitting Balance Sitting balance - Comments: able to maintain balance Balance Overall balance assessment: Needs assistance Sitting-balance support: Feet supported, Bilateral upper extremity supported Sitting balance-Leahy Scale: Fair Sitting balance - Comments: able to maintain balance Postural control: Right lateral lean Standing balance support: Bilateral upper extremity supported Standing balance-Leahy Scale: Poor Standing balance comment: able to maintain standing balance with UE support for 1.5 min without assist      Special needs/care consideration Rectal tube 05/01/20 and condom catheter ( colovesical fistula)   #6 uncuffed Shiley trach 05/26/20 PMSV on room air during the day and 28 % trach collar at HS   Cortrak 43 inches 10 Fr left nare 05/02/20   MASD 11/17 to right buttocks   Had been transferred from ICU to progressive twice with rapid response called due to narcotics oversedation and returned to ICU. AVOID  narcotics per family request.   Tele sitter may be needed when family not present due  to delirium and need for redirection Family member visits daily 7 am until 8 pm regularly      Previous Home Environment  Living Arrangements: Spouse/significant other  Lives With: Spouse, Daughter, Other (Comment) (daughter and son in law very involved) Available Help at Discharge: Family, Available 24 hours/day Type of Home: House Home Layout: One level Home Access: Level entry Bathroom Shower/Tub: Multimedia programmer: Handicapped height Bathroom Accessibility: Yes How Accessible: Accessible via walker [place]: No   Discharge Living Setting Plans for Discharge Living Setting: Patient's home, Lives with (comment) (wife) Type of Home at Discharge: House Discharge Home Layout: One level Discharge Home Access: Level entry Discharge Bathroom Shower/Tub: Walk-in shower Discharge Bathroom Toilet: Handicapped height Discharge Bathroom Accessibility: Yes How Accessible: Accessible via walker Does the patient have any problems obtaining your medications?: No   Social/Family/Support Systems Contact Information: daughter, [name] , main contact Anticipated Caregiver: daughter and son in law Anticipated Caregiver's Contact Information: see above Caregiver Availability: 24/7 Discharge Plan Discussed with Primary Caregiver: Yes Is Caregiver In Agreement with Plan?: Yes Does Caregiver/Family have Issues with Lodging/Transportation while Pt is in Rehab?: No   Goals Patient/Family Goal for Rehab: min asisst with PT, OT and Supervision SLP Expected length of stay: ELOS 2 to 3 weeks Pt/Family Agrees to Admission and willing to participate: Yes Program Orientation Provided & Reviewed with Pt/Caregiver Including Roles  & Responsibilities: Yes   Decrease burden of Care through IP rehab admission: n/a   Possible need for SNF placement upon discharge: not anticiapted   Patient  Condition: I have reviewed medical records from [facility] , spoken with CM, and patient, spouse, daughter and family member. I met with patient at the bedside for inpatient rehabilitation assessment.  Patient will benefit from ongoing PT, OT and SLP, can actively participate in 3 hours of therapy a day 5 days of the week, and can make measurable gains during the admission.  Patient will also benefit from the coordinated team approach during an Inpatient Acute Rehabilitation admission.  The patient will receive intensive therapy as well as Rehabilitation physician, nursing, social worker, and care management interventions.  Due to bladder management, bowel management, safety, skin/wound care, disease management, medication administration, pain management and patient education the patient requires 24 hour a day rehabilitation nursing.  The patient is currently mod to max assist overall with mobility and basic ADLs.  Discharge setting and therapy post discharge at home with home health is anticipated.  Patient has agreed to participate in the Acute Inpatient Rehabilitation Program and will admit today.   Preadmission Screen Completed By:  Cleatrice Burke, 05/30/2020 11:32 AM ______________________________________________________________________   Discussed status with Dr. Posey Pronto  on  05/30/2020 at  1135 and received approval for admission today.   Admission Coordinator:  Cleatrice Burke, RN, time  0174 Date  05/30/2020    Assessment/Plan: Diagnosis: Debility    1. Does the need for close, 24 hr/day Medical supervision in concert with the patient's rehab needs make it unreasonable for this patient to be served in a less intensive setting? Yes  2. Co-Morbidities requiring supervision/potential complications:  AAA, prostate cancer, heart murmur, diverticulitis, IBS, osteoarthritis of bilateral knees,  ACDF, COPD, CAD, and atherosclerosis, AKI, metabolic encephalopathy, hypoxia//RLL  pneumonia and hypotension.  3. Due to bladder management, bowel management, safety, skin/wound care, disease management, medication administration, pain management and patient education, does the patient require 24 hr/day rehab nursing? Yes 4. Does the patient require coordinated care  of a physician, rehab nurse, PT, OT, and SLP to address physical and functional deficits in the context of the above medical diagnosis(es)? Yes Addressing deficits in the following areas: balance, endurance, locomotion, strength, transferring, bathing, dressing, toileting, cognition and psychosocial support 5. Can the patient actively participate in an intensive therapy program of at least 3 hrs of therapy 5 days a week? Yes 6. The potential for patient to make measurable gains while on inpatient rehab is excellent 7. Anticipated functional outcomes upon discharge from inpatient rehab: modified independent and supervision PT, modified independent and supervision OT, modified independent SLP 8. Estimated rehab length of stay to reach the above functional goals is: 14-17 days. 9. Anticipated discharge destination: Home 10. Overall Rehab/Functional Prognosis: good     MD Signature: Delice Lesch, MD, ABPMR        Revision History                          Note Details  Author Jamse Arn, MD File Time 05/30/2020 12:02 PM  Author Type Physician Status Signed  Last Editor Jamse Arn, MD Service Physical Medicine and Rehabilitation

## 2020-05-30 NOTE — Progress Notes (Signed)
Pt attempting to get out of the bed. Pt reoriented to time and place. Pt bed linen changed due to being soiled.  repositioned in the bed, call light in reach. Bed alarm on.

## 2020-05-30 NOTE — Progress Notes (Signed)
NAME:  Carlos Olson, MRN:  [id number], DOB:  1940/12/25, LOS: 5 ADMISSION DATE:  04/17/2020, CONSULTATION DATE: 10/21 REFERRING MD: Dr. Donzetta Olson, CHIEF COMPLAINT: AKI with mild hypoxia  Brief History   79 year old male who presented 10/18 for elective endovascular repair abdominal aortic aneurysm with Dr. Donzetta Olson. PCCM consulted 10/21 for assistance in medical management.  Hospital course complicated by AKI, metabolic encephalopathy, hypoxia/ RLL pneumonia, and hypotension. Treated in ICU for sepsis, AFRVR, underwent cardioversion, ABX course. Moved out of ICU. Ongoing course significant for AKI, AF, debility. Then 11/2 he became encephalopathic overnight and was transferred back to ICU. PCCM re-consulted.  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  Past Medical History  AAA History of prostate cancer Heart murmur Diverticulitis COPD CAD atherosclerosis  Significant Hospital Events   Admitted 10/18 for elective AAA repair 10/19 OR  11/15 fever while on cefazolin, changed to ceftriaxone  Consults:  Cardiology PCCM Nephrology GI  Procedures:  Endovascular AAA repair 10/19 Tracheostomy tube placement 11/10  Significant Diagnostic Tests:  04/21/2020 CT A/P > Status post abdominal aortic repair with aortobifemoral bypass graft. Some stranding is noted in the operative site likely related to postoperative change. No sizable hematoma is seen. Diverticulosis without diverticulitis. Near complete consolidation of the right lower lobe. Small left pleural effusion is seen with left basilar atelectasis. 10/25 CTH > 1. Stable.  No acute intracranial abnormality. 2. Atrophy with chronic small vessel white matter ischemic disease.   Micro Data:  Blood 10/28 > negative Sputum 10/28 > few candida Tracheal aspirate 11/12-MSSA 11/15 urine-E.Coli>>  pansensitive  Antimicrobials:  10/19 cefazolin  10/22 zosyn > 10/26 Vancomycin 10/29 > 11/2  Zosyn 10/29 > 11/3 Fluconazole 10/29 > Zyvox 11/12 Ceftriaxone 11/15 >  Interim history/subjective:  Pt is in bed, wearing PMV on RA with sats of 98% Night time trach collar as he has some desaturations at night Per wife, periods of confusion at night and early after waking up,  T Max 98.7 Pt has not been capped, but has been tolerating PMV Will need follow up with Carlos Olson at the [facility] as OP for decannulation after CIR  Objective   Blood pressure 113/63, pulse 94, temperature 98.9 F (37.2 C), temperature source Oral, resp. rate (!) 28, height 6\' 3"  (1.905 m), weight 86.3 kg, SpO2 100 %.    FiO2 (%):  [21 %-28 %] 28 %   Intake/Output Summary (Last 24 hours) at 05/30/2020 0851 Last data filed at 05/30/2020 0700 Gross per 24 hour  Intake 658.75 ml  Output 700 ml  Net -41.25 ml   Filed Weights   05/28/20 0500 05/29/20 0700 05/30/20 0428  Weight: 87.3 kg 86.6 kg 86.3 kg   General:  eldlerly M, well-appearing and sitting up in bed without distress HEENT: MM pink/moist, Trach and Passy Muir valve in place  Neuro: awake and alert, conversational and following commands CV: s1s2 , no m/r/g PULM:  On RA, clear bilaterally without wheezing or rhonchi GI: soft, bsx4 active Extremities: warm/dry, no edema , brisk capillary refill Skin: no rashes or lesions   Resolved Hospital Problem list   Hypotension due to hypovolemia  RLL pneumonia Acute metabolic and toxic encephalopathy -Avoid all narcotics MSSA pneumonia  Assessment & Plan:   Acute hypercarbic respiratory failure -Now chronic failure in the setting of critical illness -Status post tracheostomy 11/10 -HX COPD without acute exacerbation P: Tolerating RA and  Passy Muir valve with cuffless #6 Shiley Routine trach care As needed bronchodilators SPO2 goal greater than 88 Encourage pulmonary hygiene Mobilize as  able, possibly to inpatient rehab in next few days   Periodic delirium with sundowning -Improved with Seroquel.  Colovesical fistula  -etiology unclear, previous history of radiation for prostate cancer and diverticulosis. - Evaluated by GI and urology, no intervention to be made at this time\ P: Completed 10 days of ceftriaxone Continue Foley  Atrial fibrillation - Will need OP imaging  follow up to monitor for amio lung toxicity ( previous diagnosis of COPD)>> Followed by Dr. Lamonte Olson  P: Continue Metroprolol Continue apixaban Continuous telemetry Trend BMET to support Na and K repletion as needed  Anemia  P: Hemoglobin continues to remain stable with no acute signs of bleeding Transfuse per protocol follow  Intermittently trend CBC   History of pulmonary nodule noted 02/2020 -Plan to follow as an outpatient -Pt is due for 3 month follow up 05/2020 P: Supportive care  Recent repair of abdominal aortic aneurysm P: Management per vascular surgery  Malnutrition P: Transition to nocturnal feeds to allow for daytime oral intake Continue SLP eval   Pt continues to improve.Tolerating PMV and ATC at bedtime.  Patient has had failure on the floor x 2, requiring return to ICU each time after decompensation.  Prefer to keep patient in ICU until he has a bed on CIR to prevent risk of repeat decompensation and return to ICU. Will need OP follow up at trach clinic Carlos Griffon, NP) after d/c home.  Best practice:  Diet: Continue enteral nutrition TF and oral feeds.  Pain/Anxiety/Delirium protocol (if indicated: Xanax decreased): Seroquel VAP protocol (if indicated): n/a DVT prophylaxis: Eliquis GI prophylaxis: No longer indicated Glucose control: euglycemic on no coverage Mobility: PT as tolerated Code Status: Full Family Communication: Per primary Disposition: Hopefully to CIR soon     CRITICAL CARE Performed by: Carlos Olson   Total critical care time: 30   minutes  Critical care time was exclusive of separately billable procedures and treating other patients.  Critical care was necessary to treat or prevent imminent or life-threatening deterioration.  Critical care was time spent personally by me on the following activities: development of treatment plan with patient and/or surrogate as well as nursing, discussions with consultants, evaluation of patient's response to treatment, examination of patient, obtaining history from patient or surrogate, ordering and performing treatments and interventions, ordering and review of laboratory studies, ordering and review of radiographic studies, pulse oximetry and re-evaluation of patient's condition.   Carlos Carpen Kely Dohn, PA-C

## 2020-05-31 ENCOUNTER — Inpatient Hospital Stay (HOSPITAL_COMMUNITY): Payer: Medicare HMO

## 2020-05-31 ENCOUNTER — Inpatient Hospital Stay (HOSPITAL_COMMUNITY): Payer: Medicare HMO | Admitting: Speech Pathology

## 2020-05-31 ENCOUNTER — Telehealth: Payer: Self-pay | Admitting: Critical Care Medicine

## 2020-05-31 ENCOUNTER — Inpatient Hospital Stay (HOSPITAL_COMMUNITY): Payer: Medicare HMO | Admitting: Occupational Therapy

## 2020-05-31 DIAGNOSIS — T50905A Adverse effect of unspecified drugs, medicaments and biological substances, initial encounter: Secondary | ICD-10-CM

## 2020-05-31 DIAGNOSIS — R5381 Other malaise: Principal | ICD-10-CM

## 2020-05-31 DIAGNOSIS — I482 Chronic atrial fibrillation, unspecified: Secondary | ICD-10-CM

## 2020-05-31 DIAGNOSIS — R410 Disorientation, unspecified: Secondary | ICD-10-CM

## 2020-05-31 DIAGNOSIS — Z930 Tracheostomy status: Secondary | ICD-10-CM

## 2020-05-31 DIAGNOSIS — R1312 Dysphagia, oropharyngeal phase: Secondary | ICD-10-CM

## 2020-05-31 LAB — COMPREHENSIVE METABOLIC PANEL
ALT: 20 U/L (ref 0–44)
AST: 17 U/L (ref 15–41)
Albumin: 2.3 g/dL — ABNORMAL LOW (ref 3.5–5.0)
Alkaline Phosphatase: 121 U/L (ref 38–126)
Anion gap: 10 (ref 5–15)
BUN: 26 mg/dL — ABNORMAL HIGH (ref 8–23)
CO2: 22 mmol/L (ref 22–32)
Calcium: 8.8 mg/dL — ABNORMAL LOW (ref 8.9–10.3)
Chloride: 105 mmol/L (ref 98–111)
Creatinine, Ser: 1.26 mg/dL — ABNORMAL HIGH (ref 0.61–1.24)
GFR, Estimated: 58 mL/min — ABNORMAL LOW (ref >60)
Glucose, Bld: 110 mg/dL — ABNORMAL HIGH (ref 70–99)
Potassium: 4 mmol/L (ref 3.5–5.1)
Sodium: 137 mmol/L (ref 135–145)
Total Bilirubin: 0.5 mg/dL (ref 0.3–1.2)
Total Protein: 5.5 g/dL — ABNORMAL LOW (ref 6.5–8.1)

## 2020-05-31 LAB — CBC WITH DIFFERENTIAL/PLATELET
Abs Immature Granulocytes: 0.02 10*3/uL (ref 0.00–0.07)
Basophils Absolute: 0.1 10*3/uL (ref 0.0–0.1)
Basophils Relative: 1 %
Eosinophils Absolute: 0.4 10*3/uL (ref 0.0–0.5)
Eosinophils Relative: 7 %
HCT: 30.9 % — ABNORMAL LOW (ref 39.0–52.0)
Hemoglobin: 9.7 g/dL — ABNORMAL LOW (ref 13.0–17.0)
Immature Granulocytes: 0 %
Lymphocytes Relative: 9 %
Lymphs Abs: 0.5 10*3/uL — ABNORMAL LOW (ref 0.7–4.0)
MCH: 31.1 pg (ref 26.0–34.0)
MCHC: 31.4 g/dL (ref 30.0–36.0)
MCV: 99 fL (ref 80.0–100.0)
Monocytes Absolute: 1 10*3/uL (ref 0.1–1.0)
Monocytes Relative: 16 %
Neutro Abs: 3.9 10*3/uL (ref 1.7–7.7)
Neutrophils Relative %: 67 %
Platelets: 263 10*3/uL (ref 150–400)
RBC: 3.12 MIL/uL — ABNORMAL LOW (ref 4.22–5.81)
RDW: 16.3 % — ABNORMAL HIGH (ref 11.5–15.5)
WBC: 5.9 10*3/uL (ref 4.0–10.5)
nRBC: 0 % (ref 0.0–0.2)

## 2020-05-31 MED ORDER — MEGESTROL ACETATE 400 MG/10ML PO SUSP
400.0000 mg | Freq: Two times a day (BID) | ORAL | Status: DC
  Administered 2020-05-31 – 2020-06-06 (×13): 400 mg
  Filled 2020-05-31 (×13): qty 10

## 2020-05-31 MED ORDER — FREE WATER
250.0000 mL | Freq: Two times a day (BID) | Status: DC
  Administered 2020-05-31 – 2020-06-06 (×13): 250 mL

## 2020-05-31 MED ORDER — VITAL 1.5 CAL PO LIQD
900.0000 mL | ORAL | Status: DC
  Administered 2020-05-31 – 2020-06-02 (×3): 900 mL
  Filled 2020-05-31 (×3): qty 948

## 2020-05-31 NOTE — Plan of Care (Signed)
Problem: RH Balance Goal: LTG: Patient will maintain dynamic sitting balance (OT) Description: LTG:  Patient will maintain dynamic sitting balance with assistance during activities of daily living (OT) Flowsheets (Taken 05/31/2020 1253) LTG: Pt will maintain dynamic sitting balance during ADLs with: Supervision/Verbal cueing Goal: LTG Patient will maintain dynamic standing with ADLs (OT) Description: LTG:  Patient will maintain dynamic standing balance with assist during activities of daily living (OT)  Flowsheets (Taken 05/31/2020 1253) LTG: Pt will maintain dynamic standing balance during ADLs with: Contact Guard/Touching assist   Problem: Sit to Stand Goal: LTG:  Patient will perform sit to stand in prep for activites of daily living with assistance level (OT) Description: LTG:  Patient will perform sit to stand in prep for activites of daily living with assistance level (OT) Flowsheets (Taken 05/31/2020 1253) LTG: PT will perform sit to stand in prep for activites of daily living with assistance level: Contact Guard/Touching assist   Problem: RH Eating Goal: LTG Patient will perform eating w/assist, cues/equip (OT) Description: LTG: Patient will perform eating with assist, with/without cues using equipment (OT) Flowsheets (Taken 05/31/2020 1253) LTG: Pt will perform eating with assistance level of: Set up assist    Problem: RH Grooming Goal: LTG Patient will perform grooming w/assist,cues/equip (OT) Description: LTG: Patient will perform grooming with assist, with/without cues using equipment (OT) Flowsheets (Taken 05/31/2020 1253) LTG: Pt will perform grooming with assistance level of: Set up assist    Problem: RH Bathing Goal: LTG Patient will bathe all body parts with assist levels (OT) Description: LTG: Patient will bathe all body parts with assist levels (OT) Flowsheets (Taken 05/31/2020 1253) LTG: Pt will perform bathing with assistance level/cueing: Contact Guard/Touching  assist   Problem: RH Dressing Goal: LTG Patient will perform upper body dressing (OT) Description: LTG Patient will perform upper body dressing with assist, with/without cues (OT). Flowsheets (Taken 05/31/2020 1253) LTG: Pt will perform upper body dressing with assistance level of: Contact Guard/Touching assist Goal: LTG Patient will perform lower body dressing w/assist (OT) Description: LTG: Patient will perform lower body dressing with assist, with/without cues in positioning using equipment (OT) Flowsheets (Taken 05/31/2020 1253) LTG: Pt will perform lower body dressing with assistance level of: Contact Guard/Touching assist   Problem: RH Toileting Goal: LTG Patient will perform toileting task (3/3 steps) with assistance level (OT) Description: LTG: Patient will perform toileting task (3/3 steps) with assistance level (OT)  Flowsheets (Taken 05/31/2020 1253) LTG: Pt will perform toileting task (3/3 steps) with assistance level: Contact Guard/Touching assist   Problem: RH Functional Use of Upper Extremity Goal: LTG Patient will use RT/LT upper extremity as a (OT) Description: LTG: Patient will use right/left upper extremity as a stabilizer/gross assist/diminished/nondominant/dominant level with assist, with/without cues during functional activity (OT) Flowsheets (Taken 05/31/2020 1253) LTG: Use of upper extremity in functional activities: LUE as nondominant level LTG: Pt will use upper extremity in functional activity with assistance level of: Supervision/Verbal cueing   Problem: RH Toilet Transfers Goal: LTG Patient will perform toilet transfers w/assist (OT) Description: LTG: Patient will perform toilet transfers with assist, with/without cues using equipment (OT) Flowsheets (Taken 05/31/2020 1253) LTG: Pt will perform toilet transfers with assistance level of: Contact Guard/Touching assist   Problem: RH Tub/Shower Transfers Goal: LTG Patient will perform tub/shower transfers w/assist  (OT) Description: LTG: Patient will perform tub/shower transfers with assist, with/without cues using equipment (OT) Flowsheets (Taken 05/31/2020 1253) LTG: Pt will perform tub/shower stall transfers with assistance level of: Contact Guard/Touching assist  Problem: RH Attention Goal: LTG Patient will demonstrate this level of attention during functional activites (OT) Description: LTG:  Patient will demonstrate this level of attention during functional activites  (OT) Flowsheets (Taken 05/31/2020 1253) Patient will demonstrate above attention level in the following environment: Home LTG: Patient will demonstrate this level of attention during functional activites (OT): Minimal Assistance - Patient > 75%   Problem: RH Awareness Goal: LTG: Patient will demonstrate awareness during functional activites type of (OT) Description: LTG: Patient will demonstrate awareness during functional activites type of (OT) Flowsheets (Taken 05/31/2020 1253) LTG: Patient will demonstrate awareness during functional activites type of (OT): Minimal Assistance - Patient > 75%

## 2020-05-31 NOTE — Plan of Care (Signed)
  Problem: RH BOWEL ELIMINATION Goal: RH STG MANAGE BOWEL WITH ASSISTANCE Description: STG Manage Bowel with min Assistance. Outcome: Not Progressing; rectal tube per report pt has fistula; leaking ; RN irrigated tube and added saline to balloon.   Problem: RH BLADDER ELIMINATION Goal: RH STG MANAGE BLADDER WITH ASSISTANCE Description: STG Manage Bladder With min Assistance Outcome: Not Progressing; pt has fistula per report; some incontinence noted   Problem: RH SAFETY Goal: RH STG ADHERE TO SAFETY PRECAUTIONS W/ASSISTANCE/DEVICE Description: STG Adhere to Safety Precautions With min Assistance/Device. Outcome: Not Progressing; restless getting OOB ; ordered telesitter; beds setting middle alarm   Problem: RH KNOWLEDGE DEFICIT GENERAL Goal: RH STG INCREASE KNOWLEDGE OF SELF CARE AFTER HOSPITALIZATION Description: Patient will be able to demonstrate knowledge of medication manage, dietary restrictions, safety precautions, with handouts, education materials and min assist from staff. Outcome: Not Progressing; confusion

## 2020-05-31 NOTE — Progress Notes (Signed)
Patient Details  Name: Carlos Olson MRN: [id number] Date of Birth: [birth year]  Today's Date: 05/31/2020  Hospital Problems: Principal Problem:   Debility  Past Medical History:  Past Medical History:  Diagnosis Date  . AAA (abdominal aortic aneurysm) ([place])   . Atherosclerosis of aortic arch ([place]) 09/25/2015   By CT scan   . CAD (coronary artery disease) 09/25/2015   Mild 3v by CT scan   . Cancer [facility])    prostate  . Colon polyps   . COPD (chronic obstructive pulmonary disease) ([place])   . Diverticulosis of colon   . Femoral bruit    bilateral, found by Dr. Jefm Bryant  . Heart murmur    child  . History of prostate cancer   . IBS (irritable bowel syndrome)    improved after retirement  . 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 Pelvic fracture ([place])   . Pneumonia    child  . Pulmonary nodules 09/25/2015   On screening CT scan, rpt 1 yr   . Shortness of breath    doe   Past Surgical History:  Past Surgical History:  Procedure Laterality Date  . ANTERIOR CERVICAL DECOMP/DISCECTOMY FUSION  07/29/2011   Procedure: ANTERIOR CERVICAL DECOMPRESSION/DISCECTOMY FUSION 1 LEVEL;  Surgeon: Jessy Oto, MD;  Location: [place];  Service: Orthopedics;  Laterality: N/A;  Anterior Cervical Discectomy and Fusion C3-4  . AORTA - BILATERAL FEMORAL ARTERY BYPASS GRAFT N/A 04/17/2020   Procedure: AORTA BIFEMORAL BYPASS GRAFT USING A HEMASHIELD GOLD BIFURCATED GRAFT;  Surgeon: Waynetta Sandy, MD;  Location: [place];  Service: Vascular;  Laterality: N/A;  . BUBBLE STUDY  04/26/2020   Procedure: BUBBLE STUDY;  Surgeon: Freada Bergeron, MD;  Location: [place];  Service: Cardiovascular;;  . CARDIOVERSION N/A 04/26/2020   Procedure: CARDIOVERSION;  Surgeon: Freada Bergeron, MD;  Location: [facility] ENDOSCOPY;  Service: Cardiovascular;  Laterality: N/A;  . CATARACT EXTRACTION W/PHACO Right 07/10/2015   Procedure: CATARACT EXTRACTION PHACO AND  INTRAOCULAR LENS PLACEMENT ([place]);  Surgeon: Birder Robson, MD;  Location: [facility] ORS;  Service: Ophthalmology;  Laterality: Right;  [place] 00:58   . CATARACT EXTRACTION W/PHACO Left 07/24/2015   Procedure: CATARACT EXTRACTION PHACO AND INTRAOCULAR LENS PLACEMENT ([facility]);  Surgeon: Birder Robson, MD;  Location: [facility];  Service: Ophthalmology;  Laterality: Left;  [place] 00:38   . COLONOSCOPY  12/2007   [place], diverticulosis and polyp, rpt 5 yrs  . dental implants    . EMBOLIZATION Right 03/26/2020   Procedure: EMBOLIZATION;  Surgeon: Waynetta Sandy, MD;  Location: [place] [facility];  Service: Cardiovascular;  Laterality: Right;  . FORAMINOTOMY 2 LEVEL Left 10/2013   C4/5, C5/6 ([place])  . HERNIA REPAIR     right 05/1999, left 09/04/05  . JOINT REPLACEMENT Right    TKR  . KNEE ARTHROPLASTY Left 05/20/2017   Procedure: COMPUTER ASSISTED TOTAL KNEE ARTHROPLASTY;  Surgeon: Dereck Leep, MD;  Location: [facility];  Service: Orthopedics;  Laterality: Left;  . KNEE ARTHROSCOPY  09/1997   left  . POSTERIOR CERVICAL FUSION/FORAMINOTOMY N/A 11/22/2013   Procedure: LEFT C4-5 AND C5-6 FORAMINOTOMY;  Surgeon: Jessy Oto, MD;  Location: [place];  Service: Orthopedics;  Laterality: N/A;  . PROSTATECTOMY  08/1990  . 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 TEE WITHOUT CARDIOVERSION N/A 04/26/2020   Procedure: TRANSESOPHAGEAL ECHOCARDIOGRAM (TEE);  Surgeon: Freada Bergeron, MD;  Location: [place];  Service: Cardiovascular;  Laterality: N/A;  .  TONSILLECTOMY     Social History:  reports that he quit smoking about 13 years ago. His smoking use included cigarettes. He has a 104.00 pack-year smoking history. He has never used smokeless tobacco. He reports current alcohol use of about 14.0 standard drinks of alcohol per week. He reports that he does not use drugs.  Family / Support Systems Marital Status: Married How Long?: 72 years Patient Roles: Spouse, Parent Spouse/Significant Other: Luellen Pucker  (wife) Children: 2 adult children- [name] and Aaron Edelman. [name] [phone])- main contact for pt Other Supports: son in law Legrand Como Anticipated Caregiver: wife, dtr and son in law Ability/Limitations of Caregiver: none reported Caregiver Availability: 24/7 Family Dynamics: Pt lives with his wife, and has support from dtr and son in Sports coach.  Social History Preferred language: English Religion: Non-Denominational Cultural Background: Pt worked as Medical illustrator for 47 years. Education: college graduate Read: Yes Write: Yes Employment Status: Retired Date Retired/Disabled/Unemployed: 2012 Age Retired: 43 Public relations account executive Issues: Denies Guardian/Conservator: N/A   Abuse/Neglect Abuse/Neglect Assessment Can Be Completed: Yes Physical Abuse: Denies Verbal Abuse: Denies Sexual Abuse: Denies Exploitation of patient/patient's resources: Denies Self-Neglect: Denies  Emotional Status Pt's affect, behavior and adjustment status: Pt in good spirtits at time of visit. Recent Psychosocial Issues: Denies Psychiatric History: Denies Substance Abuse History: Denies; quit 55 yrs ago from cigarettes; 1 drink of bourbon per day.  Patient / Family Perceptions, Expectations & Goals Pt/Family understanding of illness & functional limitations: Pt and family have general understanding of care needs Premorbid pt/family roles/activities: Independent Anticipated changes in roles/activities/participation: Assistance with ADLs/IADLs Pt/family expectations/goals: Pt goal is "getting health," Son in law Legrand Como "getting healthy and stronger."  [employer]: None Premorbid Home Care/DME Agencies: None Transportation available at discharge: family Resource referrals recommended: Neuropsychology  Discharge Planning Living Arrangements: Spouse/significant other Support Systems: Spouse/significant other, Children Type of Residence: Private residence Administrator, sports:  Multimedia programmer (specify) ([place] Medicare) Museum/gallery curator Resources: Radio broadcast assistant Screen Referred: No Living Expenses: Medical laboratory scientific officer Management: Patient Does the patient have any problems obtaining your medications?: No Home Management: Pt wife manages housekeeping/meals Patient/Family Preliminary Plans: TBD Care Coordinator Anticipated Follow Up Needs: HH/OP  Clinical Impression SW met with pt and pt son in law in room to introduce self, explain role, and discuss discharge process. Pt is not a English as a second language teacher. HCPOA is his wife first, and then dtr [name]. Confirms his dtr [name] is main contact to discuss d/c needs. No DME. Pt aware SW to follow-up with his dtr [name]. SN: SW did observe pt displayed confusion at times which is often masked with humor or a delayed response. Pt son in law Legrand Como helped to correct pt at times during assessment.   SW left message for pt dtr [name] to introduce self, explain role, and discuss discharge process. SW encouraged follow-up, if not SW will follow-up after team conference.  *SW spoke with pt dtr [name]. She did express concerns related to pt delirium and hopes this resolves.   Kensley Lares A Yedidya Duddy 05/31/2020, 1:49 PM

## 2020-05-31 NOTE — Progress Notes (Signed)
Inpatient Rehabilitation  Patient information reviewed and entered into eRehab system by Lazar Tierce M. Yvonne Petite, M.A., CCC/SLP, PPS Coordinator.  Information including medical coding, functional ability and quality indicators will be reviewed and updated through discharge.    

## 2020-05-31 NOTE — Progress Notes (Signed)
Pt is still awake, pt has slept approx 1-2 hours through out the night. Pt denies pain, no signs of distress. Pt appears to be alert to person/place/situation at this time. Pt resting in bed, call light in reach.

## 2020-05-31 NOTE — Evaluation (Signed)
Occupational Therapy Assessment and Plan  Patient Details  Name: Carlos Olson MRN: [id number] Date of Birth: August 30, 1940  OT Diagnosis: cognitive deficits and muscle weakness (generalized) Rehab Potential: Rehab Potential (ACUTE ONLY): Good ELOS: 21-24 days   Today's Date: 05/31/2020 OT Individual Time: 1102-1200 OT Individual Time Calculation (min): 58 min     Hospital Problem: Principal Problem:   Debility   Past Medical History:  Past Medical History:  Diagnosis Date  . AAA (abdominal aortic aneurysm) ([place])   . Atherosclerosis of aortic arch ([place]) 09/25/2015   By CT scan   . CAD (coronary artery disease) 09/25/2015   Mild 3v by CT scan   . Cancer [facility])    prostate  . Colon polyps   . COPD (chronic obstructive pulmonary disease) ([place])   . Diverticulosis of colon   . Femoral bruit    bilateral, found by Dr. Jefm Bryant  . Heart murmur    child  . History of prostate cancer   . IBS (irritable bowel syndrome)    improved after retirement  . 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 Pelvic fracture ([place])   . Pneumonia    child  . Pulmonary nodules 09/25/2015   On screening CT scan, rpt 1 yr   . Shortness of breath    Michelyn Scullin   Past Surgical History:  Past Surgical History:  Procedure Laterality Date  . ANTERIOR CERVICAL DECOMP/DISCECTOMY FUSION  07/29/2011   Procedure: ANTERIOR CERVICAL DECOMPRESSION/DISCECTOMY FUSION 1 LEVEL;  Surgeon: Jessy Oto, MD;  Location: [place];  Service: Orthopedics;  Laterality: N/A;  Anterior Cervical Discectomy and Fusion C3-4  . AORTA - BILATERAL FEMORAL ARTERY BYPASS GRAFT N/A 04/17/2020   Procedure: AORTA BIFEMORAL BYPASS GRAFT USING A HEMASHIELD GOLD BIFURCATED GRAFT;  Surgeon: Waynetta Sandy, MD;  Location: [place];  Service: Vascular;  Laterality: N/A;  . BUBBLE STUDY  04/26/2020   Procedure: BUBBLE STUDY;  Surgeon: Freada Bergeron, MD;  Location: [place];  Service:  Cardiovascular;;  . CARDIOVERSION N/A 04/26/2020   Procedure: CARDIOVERSION;  Surgeon: Freada Bergeron, MD;  Location[facility] ENDOSCOPY;  Service: Cardiovascular;  Laterality: N/A;  . CATARACT EXTRACTION W/PHACO Right 07/10/2015   Procedure: CATARACT EXTRACTION PHACO AND INTRAOCULAR LENS PLACEMENT ([place]);  Surgeon: Birder Robson, MD;  Location: [facility] ORS;  Service: Ophthalmology;  Laterality: Right;  [place] 00:58   . CATARACT EXTRACTION W/PHACO Left 07/24/2015   Procedure: CATARACT EXTRACTION PHACO AND INTRAOCULAR LENS PLACEMENT (I[facility]);  Surgeon: Birder Robson, MD;  Location: [facility];  Service: Ophthalmology;  Laterality: Left;  [place] 00:38   . COLONOSCOPY  12/2007   [place], diverticulosis and polyp, rpt 5 yrs  . dental implants    . EMBOLIZATION Right 03/26/2020   Procedure: EMBOLIZATION;  Surgeon: Waynetta Sandy, MD;  Location: [place] [facility];  Service: Cardiovascular;  Laterality: Right;  . FORAMINOTOMY 2 LEVEL Left 10/2013   C4/5, C5/6 ([place])  . HERNIA REPAIR     right 05/1999, left 09/04/05  . JOINT REPLACEMENT Right    TKR  . KNEE ARTHROPLASTY Left 05/20/2017   Procedure: COMPUTER ASSISTED TOTAL KNEE ARTHROPLASTY;  Surgeon: Dereck Leep, MD;  Location: [facility];  Service: Orthopedics;  Laterality: Left;  . KNEE ARTHROSCOPY  09/1997   left  . POSTERIOR CERVICAL FUSION/FORAMINOTOMY N/A 11/22/2013   Procedure: LEFT C4-5 AND C5-6 FORAMINOTOMY;  Surgeon: Jessy Oto, MD;  Location: [place];  Service: Orthopedics;  Laterality: N/A;  .  PROSTATECTOMY  08/1990  . 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 TEE WITHOUT CARDIOVERSION N/A 04/26/2020   Procedure: TRANSESOPHAGEAL ECHOCARDIOGRAM (TEE);  Surgeon: Freada Bergeron, MD;  Location: [facility] ENDOSCOPY;  Service: Cardiovascular;  Laterality: N/A;  . TONSILLECTOMY      Assessment & Plan Clinical Impression: Patient is an 79 y.o. year old male with recent admission to the hospital on 10/18 for elective endovascular repair abdominal  aortic aneurysm. Hospital course complicated by AKI, metabolic encephalopathy, acute hypercarbic respiratory failure, hypoxia/ RLL pneumonia, and hypotension. Treated in ICU for sepsis, AFRVR, underwent cardioversion, ABX course. SLP ordered 10/28 given pt coughing with PO liquid intake. MBS 10/29: mild oropharyngeal dysphagia characterized by reduced bolus propulsion, reduced lingual retraction, reduced pharyngeal constriction, and reduced anterior laryngeal movement. Recommended dys 3 diet with thin liquid. Pt became obtunded on 11/2 and was transferred back to ICU. Pt made NPO and Cortrak placed on 11/3, SLP recommended pt remain NPO with water protocol. Pt reintubated 11/5-11/8. CXR 11/9 consistent with previous small bilateral pleural effusions and right lung base aeration.  Patient transferred to CIR on 05/30/2020 .    Patient currently requires max /total A with basic self-care skills secondary to muscle weakness, decreased cardiorespiratoy endurance, impaired timing and sequencing and decreased coordination, decreased attention to left, decreased initiation, decreased attention, decreased awareness, decreased problem solving, decreased safety awareness, decreased memory and delayed processing and decreased sitting balance, decreased standing balance and decreased postural control.  Prior to hospitalization, patient could complete BADL with independent .  Patient will benefit from skilled intervention to increase independence with basic self-care skills prior to discharge home with care partner.  Anticipate patient will require 24 hour supervision and follow up home health.  OT - End of Session Endurance Deficit: Yes Endurance Deficit Description: rest breaks within BADL tasks OT Assessment Rehab Potential (ACUTE ONLY): Good OT Patient demonstrates impairments in the following area(s): Balance;Behavior;Cognition;Endurance;Motor;Perception;Pain;Safety;Sensory OT Basic ADL's Functional Problem(s):  Eating;Grooming;Bathing;Dressing;Toileting OT Transfers Functional Problem(s): Tub/Shower;Toilet OT Additional Impairment(s): Fuctional Use of Upper Extremity OT Plan OT Intensity: Minimum of 1-2 x/day, 45 to 90 minutes OT Frequency: 5 out of 7 days OT Duration/Estimated Length of Stay: 21-24 days OT Treatment/Interventions: Cognitive remediation/compensation;Balance/vestibular training;Community reintegration;Discharge planning;Disease mangement/prevention;DME/adaptive equipment instruction;Functional electrical stimulation;Functional mobility training;Neuromuscular re-education;Pain management;Patient/family education;Psychosocial support;Self Care/advanced ADL retraining;Skin care/wound managment;Splinting/orthotics;Therapeutic Activities;UE/LE Strength taining/ROM;Therapeutic Exercise;Visual/perceptual remediation/compensation;UE/LE Coordination activities;Wheelchair propulsion/positioning OT Self Feeding Anticipated Outcome(s): Set-up A OT Basic Self-Care Anticipated Outcome(s): CGA/supervision OT Toileting Anticipated Outcome(s): CGA OT Bathroom Transfers Anticipated Outcome(s): CGA/supervision OT Recommendation Patient destination: Home Follow Up Recommendations: Home health OT Equipment Recommended: To be determined   OT Evaluation Precautions/Restrictions  Precautions Precautions: Fall Precaution Comments: trach, flexiseal Pain  denies pain Home Living/Prior Functioning Home Living Family/patient expects to be discharged to:: Private residence Living Arrangements: Spouse/significant other Available Help at Discharge: Family, Available 24 hours/day Type of Home: House Home Access: Level entry Home Layout: One level Bathroom Shower/Tub: Multimedia programmer: Handicapped height Bathroom Accessibility: Yes  Lives With: Spouse, Daughter, Other (Comment) (daughter and son in law very involved) IADL History Current License: Yes Type of Occupation: Retired, was an  Programme researcher, broadcasting/film/video at an [employer] Prior Function Level of Independence: Independent with basic ADLs, Independent with homemaking with ambulation Driving: Yes Vision Baseline Vision/History: Wears glasses Patient Visual Report: No change from baseline Cognition Overall Cognitive Status: Impaired/Different from baseline Arousal/Alertness: Awake/alert Orientation Level: Person;Place;Situation Person: Disoriented Place: Disoriented Situation: Disoriented Year: Other (Comment) (Patient would not answer question-very agitated) Month:  (Patient  would not answer question-very agitated) Day of Week: Other (Comment) (Patient would not answer question-very agitated) Memory: Impaired Memory Impairment: Retrieval deficit;Decreased recall of new information Immediate Memory Recall: Sock;Blue;Bed Memory Recall Sock: Not able to recall Memory Recall Blue: Not able to recall Memory Recall Bed: Not able to recall Attention: Sustained Sustained Attention: Impaired Sustained Attention Impairment: Verbal basic;Functional basic Awareness: Impaired Awareness Impairment: Intellectual impairment Problem Solving: Impaired Problem Solving Impairment: Verbal basic;Functional basic Behaviors: Impulsive;Restless;Confabulation;Poor frustration tolerance Safety/Judgment: Impaired Sensation Sensation Additional Comments: Unable to assess 2/2 agitation Coordination Gross Motor Movements are Fluid and Coordinated: No Fine Motor Movements are Fluid and Coordinated: No Coordination and Movement Description: decreased smoothness and accuracy on L side Finger Nose Finger Test: dysmetria on L side Motor  Motor Motor: Motor apraxia   Balance Balance Balance Assessed: Yes Static Sitting Balance Static Sitting - Balance Support: Feet supported Static Sitting - Level of Assistance: 4: Min assist Dynamic Sitting Balance Dynamic Sitting - Balance Support: During functional activity;Feet supported Dynamic  Sitting - Level of Assistance: 2: Max Insurance risk surveyor Standing - Balance Support: During functional activity Static Standing - Level of Assistance: 2: Max assist Dynamic Standing Balance Dynamic Standing - Balance Support: During functional activity Dynamic Standing - Level of Assistance: 2: Max assist;1: +1 Total assist Extremity/Trunk Assessment RUE Assessment General Strength Comments: difficult to assess, pt would not participate in MMT LUE Assessment General Strength Comments: difficult to assess, pt would not participate in MMT  Care Tool Care Tool Self Care Eating Eating activity did not occur: Safety/medical concerns Eating Assist Level: Supervision/Verbal cueing    Oral Care  Oral care, brush teeth, clean dentures activity did not occur: Refused Oral Care Assist Level: Set up assist    Bathing Bathing activity did not occur: Safety/medical concerns            Upper Body Dressing(including orthotics)   What is the patient wearing?: Button up shirt   Assist Level: Maximal Assistance - Patient 25 - 49%    Lower Body Dressing (excluding footwear)   What is the patient wearing?: Pants Assist for lower body dressing: Total Assistance - Patient < 25%    Putting on/Taking off footwear   What is the patient wearing?: Shoes;Socks Assist for footwear: Total Assistance - Patient < 25%       Care Tool Toileting Toileting activity Toileting Activity did not occur (Clothing management and hygiene only): N/A (no void or bm) (patient has a rectal tube in- no void)       Care Tool Bed Mobility Roll left and right activity        Sit to lying activity   Sit to lying assist level: Maximal Assistance - Patient 25 - 49%    Lying to sitting edge of bed activity   Lying to sitting edge of bed assist level: Maximal Assistance - Patient 25 - 49%     Care Tool Transfers Sit to stand transfer   Sit to stand assist level: Maximal Assistance - Patient 25 -  49%    Chair/bed transfer Chair/bed transfer activity did not occur: Safety/medical concerns       Toilet transfer Toilet transfer activity did not occur: Safety/medical concerns       Care Tool Cognition Expression of Ideas and Wants Expression of Ideas and Wants: Some difficulty - exhibits some difficulty with expressing needs and ideas (e.g, some words or finishing thoughts) or speech is not clear   Understanding Verbal and Non-Verbal  Content Understanding Verbal and Non-Verbal Content: Sometimes understands - understands only basic conversations or simple, direct phrases. Frequently requires cues to understand   Memory/Recall Ability *first 3 days only Memory/Recall Ability *first 3 days only: Current season;That he or she is in a hospital/hospital unit    Refer to Care Plan for [place] 1 OT Short Term Goal 1 (Week 1): Patient will complete toilet transfer with max A of 1 OT Short Term Goal 2 (Week 1): Patient will complete 1 step of LB dressing task OT Short Term Goal 3 (Week 1): Pt will maintain sitting balance at EOB with no more than mod A within BADL task  Recommendations for other services: None    Skilled Therapeutic Intervention Pt greeted supine in bed with son-in-law present. Pt was restless and agitated, trying to get out of bed. Pt confused and disoriented throughout session. OT was able to re-direct pt as he wanted to get dressed. Bed level LB dressing completed with max A to thread legs, then pt able to help pull pants up some. Pt also with rectal tube that had to be thread through pant legs. Pt needed max A to come to sitting EOB. UB dressing completed at EOB with pt experiencing multiple posterior LOBs in sitting requiring assist from son-in-law to maintain dynamic sitting balance while OT engaged pt in BADLs. Pt often times thought he was at home, and asking son-in-law to get items out of bags. Pt more pleasantly confused on session  continued. OT would re-orient patient when appropriate, but he would get more agitated at times if you told him he was in the hospital/rehab. Sit<>stand at EOB with max A of 1 person. Attempted side steps along EOB, but pt with strong posterior lean requiring max A to return to sitting safely. Pt fatigued after stand, and seemed more understanding of why he can't just walk out and leave the hospital. Attempted a second stand, but pt too fatigued to power up. Pt performed lateral scoots along EOB with min A, then returned to bed min A. OT educated patient and son-in-law on rehab process, OT purpose, POC, ELOS, and goals. Pt left semi-reclined in bed with bed alarm on, call bell in reach, and needs met.  ADL ADL Eating: Unable to assess Grooming: Unable to assess Upper Body Bathing: Unable to assess Lower Body Bathing: Unable to assess Upper Body Dressing: Maximal assistance Lower Body Dressing: Dependent Toileting: Unable to assess Toilet Transfer: Unable to assess Tub/Shower Transfer: Unable to assess Mobility  Transfers Sit to Stand: Maximal Assistance - Patient 25-49% Stand to Sit: Maximal Assistance - Patient 25-49%   Discharge Criteria: Patient will be discharged from OT if patient refuses treatment 3 consecutive times without medical reason, if treatment goals not met, if there is a change in medical status, if patient makes no progress towards goals or if patient is discharged from hospital.  The above assessment, treatment plan, treatment alternatives and goals were discussed and mutually agreed upon: by patient and by family  Valma Cava 05/31/2020, 4:11 PM

## 2020-05-31 NOTE — Progress Notes (Signed)
[place] PHYSICAL MEDICINE & REHABILITATION PROGRESS NOTE   Subjective/Complaints: Pt with complaints about food, "[place]". Anxious to get tubes out.   ROS: Limited due to cognitive/behavioral   Objective:   No results found. Recent Labs    05/30/20 0035 05/31/20 0602  WBC 6.3 5.9  HGB 9.2* 9.7*  HCT 30.2* 30.9*  PLT 241 263   Recent Labs    05/30/20 0035 05/31/20 0602  NA 138 137  K 3.8 4.0  CL 107 105  CO2 20* 22  GLUCOSE 119* 110*  BUN 30* 26*  CREATININE 1.22 1.26*  CALCIUM 9.0 8.8*    Intake/Output Summary (Last 24 hours) at 05/31/2020 1053 Last data filed at 05/31/2020 0655 Gross per 24 hour  Intake --  Output 1355 ml  Net -1355 ml        Physical Exam: Vital Signs Blood pressure (!) 115/54, pulse 66, temperature 98.3 F (36.8 C), resp. rate 20, height 6\' 3"  (1.905 m), weight 90.3 kg, SpO2 95 %.  General: Alert and oriented to person, place, No apparent distress HEENT: Head is normocephalic, atraumatic, PERRLA, EOMI, sclera anicteric, oral mucosa pink and moist, dentition intact, ext ear canals clear, NGT Neck: #6 trach with PMV Heart: Reg rate and rhythm. No murmurs rubs or gallops Chest: CTA bilaterally without wheezes, rales, or rhonchi; no distress Abdomen: Soft, non-tender, non-distended, bowel sounds positive. Extremities: No clubbing, cyanosis, or edema. Pulses are 2+ Skin: Clean and intact without signs of breakdown Neuro:  Follows basic commands. Very tangential.  Cranial nerves 2-12 are intact. Sensory exam is normal. Reflexes are 2+ in all 4's. Fine motor coordination is intact. No tremors. Motor function is grossly 4/5 UE and 4- to 4/5 LE's.  Musculoskeletal: Full ROM, No pain with AROM or PROM in the neck, trunk, or extremities. Posture appropriate Psych: labile, distracted, sometimes pleasant     Assessment/Plan: 1. Functional deficits which require 3+ hours per day of interdisciplinary therapy in a comprehensive inpatient  rehab setting.  Physiatrist is providing close team supervision and 24 hour management of active medical problems listed below.  Physiatrist and rehab team continue to assess barriers to discharge/monitor patient progress toward functional and medical goals  Care Tool:  Bathing              Bathing assist       Upper Body Dressing/Undressing Upper body dressing   What is the patient wearing?: Hospital gown only    Upper body assist Assist Level: Total Assistance - Patient < 25%    Lower Body Dressing/Undressing Lower body dressing      What is the patient wearing?: Hospital gown only     Lower body assist Assist for lower body dressing: Total Assistance - Patient < 25%     Toileting Toileting    Toileting assist Assist for toileting: 2 Helpers     Transfers Chair/bed transfer  Transfers assist           Locomotion Ambulation   Ambulation assist              Walk 10 feet activity   Assist           Walk 50 feet activity   Assist           Walk 150 feet activity   Assist           Walk 10 feet on uneven surface  activity   Assist  Wheelchair     Assist               Wheelchair 50 feet with 2 turns activity    Assist            Wheelchair 150 feet activity     Assist          Blood pressure (!) 115/54, pulse 66, temperature 98.3 F (36.8 C), resp. rate 20, height 6\' 3"  (1.905 m), weight 90.3 kg, SpO2 95 %.  Medical Problem List and Plan: 1.  Debility secondary to AAA repair 88/89/1694 complicated by acute respiratory failure/MSSA pneumonia             -patient may not shower             -ELOS/Goals: Supervision/Mod I 13-17 days             beginning therapies today 2.  Antithrombotics: -DVT/anticoagulation: Eliquis             -antiplatelet therapy: N/A 3. Pain Management: Lidoderm patch only.    -no narcs d/t delirium             Monitor with increased exertion 4.  Mood: Provide emotional support             -antipsychotic agents: Seroquel 25 mg nightly, consider increase to 50mg    -check sleep chart 5. Neuropsych: This patient is not fully capable of making decisions on his own behalf. 6. Skin/Wound Care: Routine skin checks 7. Fluids/Electrolytes/Nutrition: discussed importance of PO intake with pt.             CMP reviewed, albumin low, ongoing AKI  -add megace for appetite  -protein supps, TF for support 8.  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  Currently #6 cuffless Shiley in place.  Follow-up per critical care medicine             12/2 downsize to #4 trach cuffless 9.  AKI.  Improved.  Renal ultrasound no hydronephrosis.               -add water through NGT 10.  Hypotension.               Monitor with increased mobility. 11.  Atrial fibrillation.  Lopressor 50 mg twice daily, continue Eliquis.  Cardiac rate controlled.   controlled 12.  Acute on chronic anemia.                         Hgb trending up to 9.7 today   13.  COPD with remote history of tobacco abuse.  Continue nebulizers as directed 14.  Hyperlipidemia.  Lipitor 15.  Prostate cancer with prostatectomy 1992.  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  Follow-up urology as well as gastroenterology services.  Patient currently declined endoscopy. 17. Dysphagia: D3 diet    LOS: 1 days A FACE TO FACE EVALUATION WAS PERFORMED  Meredith Staggers 05/31/2020, 10:53 AM

## 2020-05-31 NOTE — Evaluation (Signed)
Speech Language Pathology Assessment and Plan  Patient Details  Name: Carlos Olson MRN: [id number] Date of Birth: 20-Jan-1941  SLP Diagnosis: Cognitive Impairments;Dysphagia  Rehab Potential: Good ELOS: 3 weeks    Today's Date: 05/31/2020 SLP Individual Time: 2951-8841 SLP Individual Time Calculation (min): 30 min   Hospital Problem: Principal Problem:   Debility  Past Medical History:  Past Medical History:  Diagnosis Date  . AAA (abdominal aortic aneurysm) ([place])   . Atherosclerosis of aortic arch ([place]) 09/25/2015   By CT scan   . CAD (coronary artery disease) 09/25/2015   Mild 3v by CT scan   . Cancer [facility])    prostate  . Colon polyps   . COPD (chronic obstructive pulmonary disease) ([place])   . Diverticulosis of colon   . Femoral bruit    bilateral, found by Dr. Jefm Bryant  . Heart murmur    child  . History of prostate cancer   . IBS (irritable bowel syndrome)    improved after retirement  . 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 Pelvic fracture ([place])   . Pneumonia    child  . Pulmonary nodules 09/25/2015   On screening CT scan, rpt 1 yr   . Shortness of breath    doe   Past Surgical History:  Past Surgical History:  Procedure Laterality Date  . ANTERIOR CERVICAL DECOMP/DISCECTOMY FUSION  07/29/2011   Procedure: ANTERIOR CERVICAL DECOMPRESSION/DISCECTOMY FUSION 1 LEVEL;  Surgeon: Jessy Oto, MD;  Location: [place];  Service: Orthopedics;  Laterality: N/A;  Anterior Cervical Discectomy and Fusion C3-4  . AORTA - BILATERAL FEMORAL ARTERY BYPASS GRAFT N/A 04/17/2020   Procedure: AORTA BIFEMORAL BYPASS GRAFT USING A HEMASHIELD GOLD BIFURCATED GRAFT;  Surgeon: Waynetta Sandy, MD;  Location: [place];  Service: Vascular;  Laterality: N/A;  . BUBBLE STUDY  04/26/2020   Procedure: BUBBLE STUDY;  Surgeon: Freada Bergeron, MD;  Location: [place];  Service: Cardiovascular;;  . CARDIOVERSION N/A 04/26/2020    Procedure: CARDIOVERSION;  Surgeon: Freada Bergeron, MD;  Location: [facility] ENDOSCOPY;  Service: Cardiovascular;  Laterality: N/A;  . CATARACT EXTRACTION W/PHACO Right 07/10/2015   Procedure: CATARACT EXTRACTION PHACO AND INTRAOCULAR LENS PLACEMENT ([place]);  Surgeon: Birder Robson, MD;  Location: [facility] ORS;  Service: Ophthalmology;  Laterality: Right;  [place] 00:58   . CATARACT EXTRACTION W/PHACO Left 07/24/2015   Procedure: CATARACT EXTRACTION PHACO AND INTRAOCULAR LENS PLACEMENT (I[facility]);  Surgeon: Birder Robson, MD;  Location: [facility] OR[facility];  Service: Ophthalmology;  Laterality: Left;  [place] 00:38   . COLONOSCOPY  12/2007   [place], diverticulosis and polyp, rpt 5 yrs  . dental implants    . EMBOLIZATION Right 03/26/2020   Procedure: EMBOLIZATION;  Surgeon: Waynetta Sandy, MD;  Location: [place] [facility];  Service: Cardiovascular;  Laterality: Right;  . FORAMINOTOMY 2 LEVEL Left 10/2013   C4/5, C5/6 ([place])  . HERNIA REPAIR     right 05/1999, left 09/04/05  . JOINT REPLACEMENT Right    TKR  . KNEE ARTHROPLASTY Left 05/20/2017   Procedure: COMPUTER ASSISTED TOTAL KNEE ARTHROPLASTY;  Surgeon: Dereck Leep, MD;  Location: [facility];  Service: Orthopedics;  Laterality: Left;  . KNEE ARTHROSCOPY  09/1997   left  . POSTERIOR CERVICAL FUSION/FORAMINOTOMY N/A 11/22/2013   Procedure: LEFT C4-5 AND C5-6 FORAMINOTOMY;  Surgeon: Jessy Oto, MD;  Location: [place];  Service: Orthopedics;  Laterality: N/A;  . PROSTATECTOMY  08/1990  . REPLACEMENT TOTAL  KNEE Right 07/2012  . TEE WITHOUT CARDIOVERSION N/A 04/26/2020   Procedure: TRANSESOPHAGEAL ECHOCARDIOGRAM (TEE);  Surgeon: Freada Bergeron, MD;  Location: [facility] ENDOSCOPY;  Service: Cardiovascular;  Laterality: N/A;  . TONSILLECTOMY      Assessment / Plan / Recommendation Clinical Impression Patient is an 79 year old male with past medical history of AAA, prostate cancer, heart murmur, diverticulitis, IBS, osteoarthritis of bilateral knees,  ACDF,  COPD, CAD, and atherosclerosis presented on 04/17/2020 for elective abdominal aortic aneurysm repair. Hospital course complicated by AKI, metabolic encephalopathy, hypoxia//RLL pneumonia and hypotension. Treated for sepsis, AF RVR and underwent cardioversion. Moved out of ICU and became encephalopathic overnight and transferred back to ICU. Further course with prolonged mechanical ventilation requiring trach on 05/09/2020 and MSSA pneumonia. 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 Recommended to manage with catheter drainage. Etiology unclear, previous history of radiation for prostate cancer and diverticulosis. GI also recommends no intervention at this time. Tolerating RA and PMSV with cuffless #6 trach. 28 % trach collar at night Periodic delirium with sundowning improved with Seroquel. Therapy evaluations complete with recommendations for inpatient rehab. Patient admitted 05/30/20.   Patient demonstrates moderate-severe cognitive impairments characterized by impaired orientation, impaired sustained attention, impaired awareness of deficits, impaired problem solving and decreased short-term recall which impacts his safety with functional and familiar tasks. Patient demonstrated generalized confusion this session resulting in confabulation with decreased frustration tolerance which could be intermittently redirected at times. Patient has a #6 cuffless trach on room air and is wearing the PMSV during all waking hours. Patient demonstrated adequate breath support and was 100% intelligible at the sentence level. Recommend downsizing to a #4 cuffless trach to work towards decannulation. Patient with minimal PO intake despite encouragement to eat his breakfast and demonstrated poor awareness of solid textures due to decreased attention to task despite cueing resulting in prolonged mastication and one episode of coughing. Patient frequently used liquid washes to clear  suspected pharyngeal residue. No overt s/s of aspiration noted with thin liquids. Recommend patient continue Dys. 3 textures with thin liquids with full supervision to maximize safety with PO intake. Patient would benefit from skilled SLP intervention to maximize his swallowing and cognitive functioning prior to discharge.    Skilled Therapeutic Interventions          Administered a cognitive-linguistic evaluation, PMSV evaluation and BSE. Please see above for details.   SLP Assessment  Patient will need skilled Speech Lanaguage Pathology Services during CIR admission    Recommendations  Patient may use Passy-Muir Speech Valve: During all waking hours (remove during sleep);During PO intake/meals PMSV Supervision: Intermittent MD: Please consider changing trach tube to : Smaller size SLP Diet Recommendations: Dysphagia 3 (Mech soft);Thin Liquid Administration via: Cup;Straw Medication Administration: Whole meds with liquid Supervision: Patient able to self feed;Full supervision/cueing for compensatory strategies Compensations: Slow rate;Small sips/bites;Minimize environmental distractions;Follow solids with liquid;Clear throat intermittently Postural Changes and/or Swallow Maneuvers: Seated upright 90 degrees Oral Care Recommendations: Oral care BID Recommendations for Other Services: Neuropsych consult Patient destination: Home Follow up Recommendations: 24 hour supervision/assistance;Home Health SLP Equipment Recommended: None recommended by SLP    SLP Frequency 3 to 5 out of 7 days   SLP Duration  SLP Intensity  SLP Treatment/Interventions 3 weeks  Minumum of 1-2 x/day, 30 to 90 minutes  Dysphagia/aspiration precaution training;Cognitive remediation/compensation;Internal/external aids;Therapeutic Activities;Environmental controls;Cueing hierarchy;Functional tasks;Patient/family education    Pain No/Denies Pain  SLP Evaluation Cognition Overall Cognitive Status:  Impaired/Different from  baseline Arousal/Alertness: Awake/alert Orientation Level: Oriented to person;Oriented to situation Attention: Sustained Sustained Attention: Impaired Sustained Attention Impairment: Verbal basic;Functional basic Memory: Impaired Memory Impairment: Retrieval deficit;Decreased recall of new information Awareness: Impaired Awareness Impairment: Intellectual impairment Problem Solving: Impaired Problem Solving Impairment: Verbal basic;Functional basic Behaviors: Impulsive;Restless;Confabulation;Poor frustration tolerance Safety/Judgment: Impaired  Comprehension Auditory Comprehension Overall Auditory Comprehension: Appears within functional limits for tasks assessed Expression Expression Primary Mode of Expression: Verbal Verbal Expression Overall Verbal Expression: Appears within functional limits for tasks assessed Oral Motor Oral Motor/Sensory Function Overall Oral Motor/Sensory Function: Within functional limits Motor Speech Overall Motor Speech: Appears within functional limits for tasks assessed Respiration: Within functional limits Phonation: Normal Articulation: Within functional limitis Intelligibility: Intelligible Word: 75-100% accurate Phrase: 75-100% accurate Sentence: 75-100% accurate Conversation: 75-100% accurate Motor Planning: Witnin functional limits  Care Tool Care Tool Cognition Expression of Ideas and Wants Expression of Ideas and Wants: Some difficulty - exhibits some difficulty with expressing needs and ideas (e.g, some words or finishing thoughts) or speech is not clear   Understanding Verbal and Non-Verbal Content Understanding Verbal and Non-Verbal Content: Sometimes understands - understands only basic conversations or simple, direct phrases. Frequently requires cues to understand   Memory/Recall Ability *first 3 days only Memory/Recall Ability *first 3 days only: Current season     PMSV Assessment  PMSV Trial PMSV was  placed for: during all waking hours Able to redirect subglottic air through upper airway: Yes Able to Attain Phonation: Yes Voice Quality: Normal Able to Expectorate Secretions: No attempts Breath Support for Phonation: Adequate Intelligibility: Intelligible Word: 75-100% accurate Phrase: 75-100% accurate Sentence: 75-100% accurate Conversation: 75-100% accurate Behavior: Alert;Good eye contact;Responsive to questions;Confused  Bedside Swallowing Assessment General Date of Onset: 04/25/20 Previous Swallow Assessment: MBS 11/23: Recommended Dys. 3 textures with thin liquids Diet Prior to this Study: Dysphagia 3 (soft);Thin liquids Temperature Spikes Noted: No Respiratory Status: Trach Trach Size and Type: #6;With PMSV in place;Uncuffed History of Recent Intubation: Yes Length of Intubations (days): 3 days Date extubated: 05/07/20 Behavior/Cognition: Alert;Confused;Requires cueing Oral Cavity - Dentition: Dentures, top;Dentures, bottom Self-Feeding Abilities: Able to feed self Vision: Functional for self-feeding Patient Positioning: Upright in bed Baseline Vocal Quality: Normal Volitional Cough: Strong Volitional Swallow: Able to elicit  Oral Care Assessment Does patient have any of the following "high(er) risk" factors?: None of the above Does patient have any of the following "at risk" factors?: None of the above Patient is LOW RISK: Follow universal precautions (see row information) Ice Chips Ice chips: Not tested Thin Liquid Thin Liquid: Impaired Presentation: Straw Pharyngeal  Phase Impairments: Cough - Immediate Other Comments: X 1 out of 4 ozs Nectar Thick Nectar Thick Liquid: Not tested Honey Thick Honey Thick Liquid: Not tested Puree Puree: Within functional limits Presentation: Self Fed;Spoon Solid Solid: Impaired Oral Phase Impairments: Poor awareness of bolus BSE Assessment Risk for Aspiration Impact on safety and function: Mild aspiration risk Other  Related Risk Factors: Tracheostomy;Cognitive impairment  Short Term Goals: Week 1: SLP Short Term Goal 1 (Week 1): Patient will demonstrate orientation to age, place and time with Max verbal and visual cues. SLP Short Term Goal 2 (Week 1): Patient will verbalize 3 cognitive/physical deficits with Max A verbal and visual cues. SLP Short Term Goal 3 (Week 1): Patient will demonstrate sustained attention to functional tasks for 15 minutes with Mod verbal cues for redirection. SLP Short Term Goal 4 (Week 1): Patient will demonstrate functional problem solving for basic and familiar tasks with Max verbal cues. SLP Short  Term Goal 5 (Week 1): Patient will consume current diet with minimal overt s/s of aspiration and Min A verbal cues for use of swallowing compensatory strategies. SLP Short Term Goal 6 (Week 1): Patient will demonstrate efficient masticaion and complete oral clearance with trials of regular textures without overt s/s of apiration over 2 sessions prior to upgrade with Min verbal cues.  Refer to Care Plan for Long Term Goals  Recommendations for other services: Neuropsych  Discharge Criteria: Patient will be discharged from SLP if patient refuses treatment 3 consecutive times without medical reason, if treatment goals not met, if there is a change in medical status, if patient makes no progress towards goals or if patient is discharged from hospital.  The above assessment, treatment plan, treatment alternatives and goals were discussed and mutually agreed upon: by patient  Krystie Leiter 05/31/2020, 1:21 PM

## 2020-05-31 NOTE — Telephone Encounter (Signed)
Since patient is currently at inpatient rehab with [facility] - I called trach clinic and scheduled first available in January - Spoke to [place] scheduled patient on 07/04/2020 @ 1pm - pt must have covid test prior to appt - scheduled covid test at [facility] testing site on 07/02/2020 @ 10:35am.   Dr. Carlis Abbott - I know that this will show up on patient discharge summary. Is there anything else you would like done?   Thanks -pr

## 2020-05-31 NOTE — Procedures (Signed)
Tracheostomy Change Note  Patient Details:   Name: [name] DOB: Oct 17, 1940 MRN: [id number]    Airway Documentation:     Evaluation  O2 sats: stable throughout Complications: No apparent complications Patient did tolerate procedure well. Bilateral Breath Sounds: Diminished, Clear     Trach was changed to #4 cuffless shiley without complications. Positive color change was noted. RT will monitor.   Ronaldo Miyamoto 05/31/2020, 3:19 PM

## 2020-05-31 NOTE — Evaluation (Signed)
Physical Therapy Assessment and Plan  Patient Details  Name: Carlos Olson MRN: [id number] Date of Birth: 02/05/1941  PT Diagnosis: Abnormality of gait, Impaired cognition and Muscle weakness Rehab Potential: Good ELOS: 18-21 days   Today's Date: 05/31/2020 PT Individual Time: 0258-5277 PT Individual Time Calculation (min): 72 min    Hospital Problem: Principal Problem:   Debility   Past Medical History:  Past Medical History:  Diagnosis Date  . AAA (abdominal aortic aneurysm) ([place])   . Atherosclerosis of aortic arch ([place]) 09/25/2015   By CT scan   . CAD (coronary artery disease) 09/25/2015   Mild 3v by CT scan   . Cancer [facility])    prostate  . Colon polyps   . COPD (chronic obstructive pulmonary disease) ([place])   . Diverticulosis of colon   . Femoral bruit    bilateral, found by Dr. Jefm Bryant  . Heart murmur    child  . History of prostate cancer   . IBS (irritable bowel syndrome)    improved after retirement  . 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 Pelvic fracture ([place])   . Pneumonia    child  . Pulmonary nodules 09/25/2015   On screening CT scan, rpt 1 yr   . Shortness of breath    doe   Past Surgical History:  Past Surgical History:  Procedure Laterality Date  . ANTERIOR CERVICAL DECOMP/DISCECTOMY FUSION  07/29/2011   Procedure: ANTERIOR CERVICAL DECOMPRESSION/DISCECTOMY FUSION 1 LEVEL;  Surgeon: Jessy Oto, MD;  Location: [place];  Service: Orthopedics;  Laterality: N/A;  Anterior Cervical Discectomy and Fusion C3-4  . AORTA - BILATERAL FEMORAL ARTERY BYPASS GRAFT N/A 04/17/2020   Procedure: AORTA BIFEMORAL BYPASS GRAFT USING A HEMASHIELD GOLD BIFURCATED GRAFT;  Surgeon: Waynetta Sandy, MD;  Location: [place];  Service: Vascular;  Laterality: N/A;  . BUBBLE STUDY  04/26/2020   Procedure: BUBBLE STUDY;  Surgeon: Freada Bergeron, MD;  Location: [place];  Service: Cardiovascular;;  . CARDIOVERSION N/A  04/26/2020   Procedure: CARDIOVERSION;  Surgeon: Freada Bergeron, MD;  Location: [facility];  Service: Cardiovascular;  Laterality: N/A;  . CATARACT EXTRACTION W/PHACO Right 07/10/2015   Procedure: CATARACT EXTRACTION PHACO AND INTRAOCULAR LENS PLACEMENT ([place]);  Surgeon: Birder Robson, MD;  Location: [facility];  Service: Ophthalmology;  Laterality: Right;  [place] 00:58   . CATARACT EXTRACTION W/PHACO Left 07/24/2015   Procedure: CATARACT EXTRACTION PHACO AND INTRAOCULAR LENS PLACEMENT (IOC);  Surgeon: Birder Robson, MD;  Location: [facility];  Service: Ophthalmology;  Laterality: Left;  [place] 00:38   . COLONOSCOPY  12/2007   [place], diverticulosis and polyp, rpt 5 yrs  . dental implants    . EMBOLIZATION Right 03/26/2020   Procedure: EMBOLIZATION;  Surgeon: Waynetta Sandy, MD;  Location: [place] [facility];  Service: Cardiovascular;  Laterality: Right;  . FORAMINOTOMY 2 LEVEL Left 10/2013   C4/5, C5/6 ([place])  . HERNIA REPAIR     right 05/1999, left 09/04/05  . JOINT REPLACEMENT Right    TKR  . KNEE ARTHROPLASTY Left 05/20/2017   Procedure: COMPUTER ASSISTED TOTAL KNEE ARTHROPLASTY;  Surgeon: Dereck Leep, MD;  Location: [facility];  Service: Orthopedics;  Laterality: Left;  . KNEE ARTHROSCOPY  09/1997   left  . POSTERIOR CERVICAL FUSION/FORAMINOTOMY N/A 11/22/2013   Procedure: LEFT C4-5 AND C5-6 FORAMINOTOMY;  Surgeon: Jessy Oto, MD;  Location: [place];  Service: Orthopedics;  Laterality: N/A;  . PROSTATECTOMY  08/1990  . 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 TEE WITHOUT CARDIOVERSION N/A 04/26/2020   Procedure: TRANSESOPHAGEAL ECHOCARDIOGRAM (TEE);  Surgeon: Freada Bergeron, MD;  Location: [facility];  Service: Cardiovascular;  Laterality: N/A;  . 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  History taken from chart review and daughter due to processing.  Patient lives with spouse.  Independent prior to admission.  1 level home with level entry.  Daughter and son also very involved with support.  He presented on 04/17/2020 for elective AAA repair as well as right common femoral artery aneurysm repair per Dr. Donzetta Matters.  Postoperative developed hypotension requiring phenylephrine and cardiology service follow-up.  Most recent echocardiogram reviewed showing EF of 93% grade 1 diastolic dysfunction.  TEE completed showed ejection fraction of 50-55%. No wall motion abnormalities without thrombus.  Noted AKI with creatinine peak 2.7.  Renal ultrasound no hydronephrosis.  Nephrology services consulted for follow-up.  Patient developed abdominal pain concern for mesenteric ischemia gastroenterology services Dr. Wilfrid Lund consulted.  CT abdomen pelvis showed abdominal aortic repair some stranding noted no sizable hematoma seen.  There was noted near complete consolidation of right lower lobe small left pleural effusion seen with left basilar atelectasis.  Patient developed delirium placed on Seroquel felt to be multifactorial with cranial CT scan 04/23/2020 without acute changes.  Patient was ultimately placed on Eliquis for findings of atrial fibrillation.  A nasogastric tube was placed for nutritional support as well as hydration and renal function much improved latest creatinine 1.066.  Patient did require intubation for ventilatory support due to respiratory distress 05/04/2020 with tracheostomy performed 05/09/2020 per critical care medicine [name] nurse practitioner as well as treated for MSSA PNA. He currently has a cuffless #6 Shiley trach tube in place.  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  There was consideration for endoscopy of which patient declined.  His diet has been advanced to mechanical soft thin liquid.  Hospital course further complicated by acute blood loss anemia, 9.6 monitored.  Therapy evaluations completed and patient was admitted for a comprehensive rehab program.  Please see preadmission assessment from earlier today as well..  Patient transferred to CIR on 05/30/2020 .   Patient currently requires max with mobility secondary to muscle weakness, motor apraxia and decreased coordination and decreased attention, decreased awareness, decreased problem solving, decreased safety awareness and decreased memory.  Prior to hospitalization, patient was independent  with mobility and lived with Spouse, Daughter, Other (Comment) (daughter and son in law very involved) in a House home.  Home access is  Level entry.  Patient will benefit from skilled PT intervention to maximize safe functional mobility, minimize fall risk and decrease caregiver burden for planned discharge home with 24 hour supervision.  Anticipate patient will benefit from follow up [place] at discharge.  PT - End of Session Activity Tolerance: Tolerates 30+ min activity with multiple rests Endurance Deficit: Yes Endurance Deficit Description: rest breaks with mobility tasks PT Assessment Rehab Potential (ACUTE/IP ONLY): Good PT Barriers to Discharge: Trach;Behavior;Incontinence;Other (comments) (  flexiseal) PT Patient demonstrates impairments in the following area(s): Balance;Behavior;Endurance;Motor;Safety PT Transfers Functional Problem(s): Bed Mobility;Bed to Chair;Car PT Locomotion Functional Problem(s): Ambulation;Wheelchair Mobility;Stairs PT Plan PT Intensity: Minimum of 1-2 x/day ,45 to 90 minutes PT Frequency: 5 out of 7  days PT Duration Estimated Length of Stay: 18-21 days PT Treatment/Interventions: Ambulation/gait training;DME/adaptive equipment instruction;Balance/vestibular training;Cognitive remediation/compensation;Functional mobility training;Patient/family education;Therapeutic Exercise;Therapeutic Activities;Discharge planning;Neuromuscular re-education;Stair training;UE/LE Coordination activities;UE/LE Strength taining/ROM;Wheelchair propulsion/positioning PT Transfers Anticipated Outcome(s): supervision PT Locomotion Anticipated Outcome(s): Supervision ambulation in the home, min A w/c community PT Recommendation Follow Up Recommendations: Home health PT;24 hour supervision/assistance Patient destination: Home Equipment Recommended: Rolling walker with 5" wheels;Wheelchair (measurements);Wheelchair cushion (measurements) Equipment Details: 16x20" w/c with pressure relieving cushion   PT Evaluation Precautions/Restrictions Precautions Precautions: Fall Precaution Comments: trach, flexiseal General   Vital SignsTherapy Vitals Temp: 98.3 F (36.8 C) Pulse Rate: 80 Resp: 19 BP: 131/65 Patient Position (if appropriate): Lying Oxygen Therapy SpO2: 94 % O2 Device: Room Air Pain Pain Assessment Pain Scale: Faces Faces Pain Scale: Hurts little more Pain Type: Acute pain Pain Location: Buttocks Pain Descriptors / Indicators: Sharp Pain Onset: With Activity Pain Intervention(s): Ambulation/increased activity;Repositioned Home Living/Prior Functioning Home Living Living Arrangements: Spouse/significant other Available Help at Discharge: Family;Available 24 hours/day Type of Home: House Home Access: Level entry Home Layout: One level Bathroom Shower/Tub: Multimedia programmer: Handicapped height Bathroom Accessibility: Yes  Lives With: Spouse;Daughter;Other (Comment) (daughter and son in law very involved) Prior Function Level of Independence: Independent with basic  ADLs;Independent with homemaking with ambulation  Able to Take Stairs?: Yes Driving: Yes 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 Vision/Perception     Cognition Overall Cognitive Status: Impaired/Different from baseline Arousal/Alertness: Awake/alert Orientation Level: Oriented to person;Oriented to situation;Disoriented to place;Disoriented to time Attention: Sustained Sustained Attention: Impaired Sustained Attention Impairment: Verbal basic;Functional basic Memory: Impaired Memory Impairment: Retrieval deficit;Decreased recall of new information Immediate Memory Recall: Sock;Blue;Bed Memory Recall Sock: Not able to recall Memory Recall Blue: Not able to recall Memory Recall Bed: Not able to recall Awareness: Impaired Awareness Impairment: Intellectual impairment Problem Solving: Impaired Problem Solving Impairment: Verbal basic;Functional basic Behaviors: Impulsive;Restless;Confabulation;Poor frustration tolerance Safety/Judgment: Impaired Sensation Sensation Light Touch: Impaired by gross assessment Additional Comments: decrased L UE compared to R to light touch Coordination Gross Motor Movements are Fluid and Coordinated: No Fine Motor Movements are Fluid and Coordinated: No Coordination and Movement Description: decreased smoothness and accuracy on L side Finger Nose Finger Test: dysmetria on L side Motor  Motor Motor: Motor apraxia;Abnormal postural alignment and control Motor - Skilled Clinical Observations: Needs cues for follow through with mobility tasks; posterior lean EOB and standing   Trunk/Postural Assessment  Cervical Assessment Cervical Assessment: Exceptions to [facility] (forward head, rounded shoulders) Thoracic Assessment Thoracic Assessment: Exceptions to [facility] (rounded shoulders/kyphosis) Lumbar Assessment Lumbar Assessment: Exceptions to [facility] (posterior pelvic tilt) Postural Control Postural Control: Deficits on  evaluation Trunk Control: Decresaed core strength, posterior LOB on EOB Protective Responses: min A initially to prevent posterior LOB  Balance Balance Balance Assessed: Yes Static Sitting Balance Static Sitting - Balance Support: Feet supported Static Sitting - Level of Assistance: 4: Min assist Static Sitting - Comment/# of Minutes: EOB x 15 minutes with min A progressing to S with cues, time Dynamic Sitting Balance Dynamic Sitting - Balance Support: During functional activity;Feet supported Dynamic Sitting - Level of Assistance: 3: Mod assist Dynamic Sitting - Balance Activities: Forward lean/weight shifting Static Standing Balance Static Standing - Balance Support: Bilateral upper extremity supported Static  Standing - Level of Assistance: 3: Mod assist Static Standing - Comment/# of Minutes: stand briefly due to fatigue, about 30 sec static Dynamic Standing Balance Dynamic Standing - Balance Support: During functional activity Dynamic Standing - Level of Assistance: 2: Max assist Extremity Assessment  RUE Assessment General Strength Comments: difficult to assess, pt would not participate in MMT LUE Assessment General Strength Comments: difficult to assess, pt would not participate in MMT RLE Assessment RLE Assessment: Exceptions to [facility] Active Range of Motion (AROM) Comments: [facility] General Strength Comments: hip flexion 2/5; knee extension 4+/5, ankle DF 4/5 LLE Assessment LLE Assessment: Exceptions to [facility] Passive Range of Motion (PROM) Comments: WFL Active Range of Motion (AROM) Comments: limited knee flexion due to weakness General Strength Comments: hip flexion 2-/5, knee extension 4/5, ankle DF 4/5  Care Tool Care Tool Bed Mobility Roll left and right activity        Sit to lying activity   Sit to lying assist level: Moderate Assistance - Patient 50 - 74%    Lying to sitting edge of bed activity   Lying to sitting edge of bed assist level: Maximal Assistance -  Patient 25 - 49%     Care Tool Transfers Sit to stand transfer   Sit to stand assist level: Maximal Assistance - Patient 25 - 49%    Chair/bed transfer   Chair/bed transfer assist level: Maximal Assistance - Patient 25 - 49%     Toilet transfer   Assist Level: Maximal Assistance - Patient 24 - 49%    Car transfer Car transfer activity did not occur: Safety/medical concerns        Care Tool Locomotion Ambulation   Assist level: 2 helpers Assistive device: Parallel bars    Walk 10 feet activity Walk 10 feet activity did not occur: Safety/medical concerns       Walk 50 feet with 2 turns activity Walk 50 feet with 2 turns activity did not occur: Safety/medical concerns      Walk 150 feet activity Walk 150 feet activity did not occur: Safety/medical concerns      Walk 10 feet on uneven surfaces activity Walk 10 feet on uneven surfaces activity did not occur: Safety/medical concerns      Stairs Stair activity did not occur: Safety/medical concerns        Walk up/down 1 step activity Walk up/down 1 step or curb (drop down) activity did not occur: Safety/medical concerns        Walk up/down 4 steps activity      Walk up/down 12 steps activity Walk up/down 12 steps activity did not occur: Safety/medical concerns      Pick up small objects from floor Pick up small object from the floor (from standing position) activity did not occur: Safety/medical concerns      Wheelchair Will patient use wheelchair at discharge?: Yes Type of Wheelchair: Manual   Wheelchair assist level: Minimal Assistance - Patient > 75% Max wheelchair distance: 20'  Wheel 50 feet with 2 turns activity Wheelchair 50 feet with 2 turns activity did not occur: Safety/medical concerns    Wheel 150 feet activity Wheelchair 150 feet activity did not occur: Safety/medical concerns      Refer to Care Plan for Long Term Goals  SHORT TERM GOAL WEEK 1 PT Short Term Goal 1 (Week 1): Patient to perfom bed  mobility with min A 50% of the time PT Short Term Goal 2 (Week 1): Patient to perform sit to stand transfers with  mod A PT Short Term Goal 4 (Week 1): Patient to ambulate 45' with RW and mod A  Recommendations for other services: None   Skilled Therapeutic Intervention Patient in supine with son in law present.  Performed in bed strength assessment.  Patient reports he thinks he is at home despite cues for re-orientation.  Performed supine to sit with mod to max A for lifting trunk and moving legs to EOB.  Sitting scooting to EOB with mod A.  Min to mod A initially for balance, improving to S over time.  Patient performed seated LAQ and hip flexion and seated reaching on EOB x about 10 minutes.  Sit to stand from EOB to RW max A and stand step to [facility] with mod to max A with RW and mod cues.  Assist for doffing and donning brief.  Patient c/o pain on BSC so transfer back to bed as previous then up to w/c max A stand step with RW as well.  PAtient in w/c propelled about 20' with max cues and min A for steering till c/o fatigue.  Pushed to therapy gym.  In parallel bars sit to stand with mod A and ambulated with +2 A for w/c follow with mod to max A cues for anterior weight shift, stepping and limited by fatigue.  Ambulated 8' then 3' with narrow BOS, shuffling pattern with flexed trunk, needing cues due to limited attention and apraxia.  Patient sitting in w/c pushed to room.  Sit to stand and stand step to bed with RW max A.  Patient sit to supine with A for LE's and positioning scooting to [facility] with +2 A.  Left in bed with needs in reach, son in law in room and bed alarm active.  Mobility Bed Mobility Bed Mobility: Supine to Sit;Scooting to [facility];Sitting - Scoot to Edge of Bed;Sit to Sidelying Left Supine to Sit: Moderate Assistance - Patient 50-74% Sitting - Scoot to Edge of Bed: Moderate Assistance - Patient 50-74% Sit to Sidelying Left: Moderate Assistance - Patient 50-74% Scooting to HOB: 2  Helpers Transfers Transfers: Sit to Stand;Stand to [employer] Transfers Sit to Stand: Maximal Assistance - Patient 25-49% Stand to Sit: Maximal Assistance - Patient 25-49% Stand Pivot Transfers: 2 Press photographer (Assistive device): Manufacturing systems engineer Ambulation: Yes Gait Assistance: 2 Holiday representative (Feet): 8 Feet Assistive device: Parallel bars Gait Assistance Details: Tactile cues for posture;Verbal cues for safe use of DME/AE;Verbal cues for precautions/safety;Verbal cues for technique Gait Assistance Details: in parallel bars with cues for posture due to posterior lean, assist for balance, step to sequence with shuffling pattern and narrow BOS Gait Gait: Yes Gait Pattern: Step-to pattern;Decreased dorsiflexion - right;Decreased dorsiflexion - left;Scissoring;Shuffle;Trunk flexed Stairs / Additional Locomotion Stairs: No Wheelchair Mobility Wheelchair Mobility: Yes Wheelchair Assistance: Minimal assistance - Patient >75% Wheelchair Propulsion: Both upper extremities Wheelchair Parts Management: Needs assistance Distance: 20'   Discharge Criteria: Patient will be discharged from PT if patient refuses treatment 3 consecutive times without medical reason, if treatment goals not met, if there is a change in medical status, if patient makes no progress towards goals or if patient is discharged from hospital.  The above assessment, treatment plan, treatment alternatives and goals were discussed and mutually agreed upon: by patient and by family  Jamison Oka, PT 05/31/2020, 5:00 PM

## 2020-05-31 NOTE — Progress Notes (Addendum)
Respiratory Therapy called re: Carlos Olson change per order.

## 2020-05-31 NOTE — Telephone Encounter (Signed)
Thanks so much Patrice! He was discharged yesterday to rehab. Can we call his wife or daughter or send them an epic message to let them know that this has been set up? I don't know if his discharge summary from CIR will have this? If it will, we may not need to.  Julian Hy, DO 05/31/20 8:54 PM  Pulmonary & Critical Care

## 2020-05-31 NOTE — Telephone Encounter (Signed)
Please schedule patient for follow up in trach clinic in January for trach follow up and hopefully decannulation at that time. Patient currently in [place].  Julian Hy, DO 05/31/20 7:08 AM [place] Pulmonary & Critical Care

## 2020-05-31 NOTE — Progress Notes (Signed)
Initial Nutrition Assessment  DOCUMENTATION CODES:   Severe malnutrition in context of acute illness/injury  INTERVENTION:   Continue nocturnal tube feeds via Cortrak: - Vital 1.5 @ 75 ml/hr to run for 12 hours from 1800 to 0600 (total of 900 ml) - ProSource TF 45 ml TID - Free water flushes per MD, currently 250 ml BID  Nocturnal tube feeding regimen provides 1470 kcal, 94 grams of protein, and 688 ml of H2O (meets 67% of kcal and 78% of protein needs).  Current free water including flushes: 1188 ml  - Continue Ensure Enlive po BID, each supplement provides 350 kcal and 20 grams of protein  NUTRITION DIAGNOSIS:   Severe Malnutrition related to acute illness (elective AAA reapir and right common femoral artery aneurysm repair) as evidenced by moderate fat depletion, severe muscle depletion, percent weight loss (7.4% weight loss in 2.5 months).  GOAL:   Patient will meet greater than or equal to 90% of their needs  MONITOR:   PO intake, Supplement acceptance, Labs, Weight trends, TF tolerance, Skin, I & O's  REASON FOR ASSESSMENT:   Consult Enteral/tube feeding initiation and management  ASSESSMENT:   79 year old male with PMH of COPD, CAD, AAA s/p surgical repair 04/17/20, right hypogastric artery aneurysm s/p coiling 04/06/20, IBS, prostate cancer s/p prostatectomy. Presented on 04/17/20 for elective AAA repair as well as right common femoral artery aneurysm repair. A Cortrak was placed for nutrition support as well as hydration. Pt did require intubation with ventilatory support due to respiratory distress 05/04/20 with tracheostomy performed 05/09/20. 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 Admitted to CIR on 05/30/20.   Discussed pt with RN. Per RN, pt refused Ensure Enlive this morning. Per RN, pt is somewhat confused.  Spoke with pt at bedside. He reports that he does not like the food here which is why he is not eating much.  Pt states that he has been consuming the Ensure Enlive supplements.  Pt endorses weight loss. He reports a UBW in the 220-230 lb range. Reviewed weight history in chart. Pt with a 7.2 kg weight loss since 03/26/20. This is a 7.4% weight loss in 2.5 months which is significant for timeframe. Pt meets criteria for severe malnutrition.  Will continue nocturnal TF at this time. Noted MD added megace today.  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) I/O's: -1.6 L since admit  NUTRITION - FOCUSED PHYSICAL EXAM:    Most Recent Value  Orbital Region Moderate depletion  Upper Arm Region Moderate depletion  Thoracic and Lumbar Region Moderate depletion  Buccal Region Mild depletion  Temple Region Moderate depletion  Clavicle Bone Region Severe depletion  Clavicle and Acromion Bone Region Severe depletion  Scapular Bone Region Moderate depletion  Dorsal Hand Moderate depletion  Patellar Region Moderate depletion  Anterior Thigh Region Severe depletion  Posterior Calf Region Moderate depletion  Edema (RD Assessment) None  Hair Reviewed  Eyes Reviewed  Mouth Reviewed  Skin Reviewed  Nails Reviewed       Diet Order:   Diet Order  DIET DYS 3 Room service appropriate? 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  Estimated Nutritional Needs:   Kcal:  2200-2400  Protein:  120-145 grams  Fluid:  >/= 2.2 L    Gustavus Bryant, MS, RD, LDN Inpatient Clinical Dietitian Please see AMiON for contact information.

## 2020-06-01 ENCOUNTER — Inpatient Hospital Stay (HOSPITAL_COMMUNITY): Payer: Medicare HMO | Admitting: Speech Pathology

## 2020-06-01 ENCOUNTER — Inpatient Hospital Stay (HOSPITAL_COMMUNITY): Payer: Medicare HMO | Admitting: Occupational Therapy

## 2020-06-01 ENCOUNTER — Inpatient Hospital Stay (HOSPITAL_COMMUNITY): Payer: Medicare HMO

## 2020-06-01 MED ORDER — QUETIAPINE FUMARATE 25 MG PO TABS
25.0000 mg | ORAL_TABLET | Freq: Every day | ORAL | Status: DC
  Administered 2020-06-01 – 2020-06-05 (×5): 25 mg
  Filled 2020-06-01 (×5): qty 1

## 2020-06-01 MED ORDER — QUETIAPINE FUMARATE 25 MG PO TABS: 25.0000 mg | ORAL_TABLET | Freq: Every day | ORAL | Status: DC

## 2020-06-01 NOTE — Progress Notes (Signed)
Patient ID: Carlos Olson, male   DOB: 04/25/1941, 79 y.o.   MRN: [id number]  SW spoke with pt dtr [name] to provide updates on pt ELOS and will follow-up after team conference with details.   Loralee Pacas, MSW, [place] Office: [phone] Cell: [phone] Fax: [phone]

## 2020-06-01 NOTE — Progress Notes (Signed)
Physical Therapy Session Note  Patient Details  Name: NAKHI CHOI MRN: [id number] Date of Birth: [birth year]  Today's Date: 06/01/2020 PT Individual Time: 1100-1200 PT Individual Time Calculation (min): 60 min   Short Term Goals: Week 1:  PT Short Term Goal 1 (Week 1): Patient to perfom bed mobility with min A 50% of the time PT Short Term Goal 2 (Week 1): Patient to perform sit to stand transfers with mod A PT Short Term Goal 4 (Week 1): Patient to ambulate 13' with RW and mod A Week 2:     Skilled Therapeutic Interventions/Progress Updates:    PAIN  Denies pain 02 sats 95-96% throughout session w/exertion, HR 90-95., pt w/dyspnea w/exertion.  Pt initially supine w/wife at bedside.  Pt pleasantly confused, believes he has worked w/me and thinks I am a Chief Executive Officer.  Pt reoriented to place/situation/date.  Discussed purpose of rehab and of PT session.  Wife expressed primary concern being cognition. Educated her about integration of cognition into session and role of OT/ST.  Wife relieved that staff fully aware and planning to address.  Pt agreeable to session.   Supine to side to sit w/cues for sequencing, mod assist.  Dons shoes w/cues and mod assist.  Sit to stand at edge of bed w/max assist, cues for sequencing, cues for momentum, cues for ant wt shift.  Static stand - pt leans heavily posteriorly and is unable to self correct w/cues or max assist.  Worked on wt shifting at hips w/partial improvement in midline.  Marland Kitchensquat pivot transfer bed to wc w/max assist, max cues for sequencing, poor motor planning, fearful.  Pt propels wc 74ft w/min assist for UE therex/endurance.  Sit to stand in parallel bars initially w/max assist due to post lean.  Worked on ant wt shift at hips using mirror and visual cues for feedback w/signficiant improvement in upright posture achieved.  Repeated x 3.  Gait: Sit to stand in [place] walker w/mod cues, mod assist of 1 for transition.  Once standing  performed gait trials as follows: Gait 42ft x 1, very narrow base, cannot correct w/cues, overall flexed posture, min assist of 2 for safety/navigation of walker/wc.  Rested 6-7 min Gait 64ft w/1 as above.  Pt left oob in wc handed off to nurse.  Notified rectal foley bag in need of emptying.  Nurse aware pt did not have alarm belt in room.  Attempts to reorient pt to date repeatedly unsuccessful.    Therapy Documentation Precautions:  Precautions Precautions: Fall Precaution Comments: trach, flexiseal Restrictions Weight Bearing Restrictions: No    Therapy/Group: Individual Therapy  Callie Fielding, PT   Jerrilyn Cairo 06/01/2020, 12:36 PM

## 2020-06-01 NOTE — Progress Notes (Signed)
Behavioral Plan   Behavior to decrease/ eliminate:  Trying to get out of bed, restlessness, agitation  Changes to environment:  Post a calendar Post signage of call bell to remind to call  Interventions: Telesitter 4 rails up on bed Put patient on 2nd highest bed alarm Place fall mats at bedside Increase activity - nursing can use Stedy to get him up or just stand at EOB Can push wheelchair around in hallway to burn energy  Recommendations for interactions with patient: Tell patient who you are, why you are there, and help to re-orient him If patient is very agitated, it can make it worse to re-orient him, so do not argue with him.     Attendees:  Tobey Bride Gallahger PT Cherylynn Ridges OT

## 2020-06-01 NOTE — Progress Notes (Signed)
Occupational Therapy Session Note  Patient Details  Name: Carlos Olson MRN: [id number] Date of Birth: 11-06-1940  Today's Date: 06/01/2020 OT Individual Time: 2074-0979 OT Individual Time Calculation (min): 60 min    Short Term Goals: Week 1:  OT Short Term Goal 1 (Week 1): Patient will complete toilet transfer with max A of 1 OT Short Term Goal 2 (Week 1): Patient will complete 1 step of LB dressing task OT Short Term Goal 3 (Week 1): Pt will maintain sitting balance at EOB with no more than mod A within BADL task  Skilled Therapeutic Interventions/Progress Updates:     Patient greeted semi-reclined in bed awake, pleasant, and agreeable to OT treatment session. Pt A&O x 3 this morning. Pleasantly confused throughout session, but easy to re-direct and re-orient. Bed level LB ADLs completed with max/total A. Worked on rolling L and R with max A. Pt came to sitting EOB with max of 1. Improved sitting balance today, starting at min/mod A, fading to supervision for 10 minutes at longest bout. Pt with some posterior LOB with increased task demand of UB bathing. Patient attempted 3 sit<stands, but pt with strong posterior lean and OT unable to facilitate enough anterior weight shift and hip extension to achieve full stand. OT obtained Stedy and practiced sit<>stands from [place] with improved anterior weight shift with target of Stedy bar. Pt completed 3 sit<>stands in [place], then returned to bed with max A. Pt left semi-reclined in bed with bed alarm on, call bell in reach, and needs met.   Therapy Documentation Precautions:  Precautions Precautions: Fall Precaution Comments: trach, flexiseal Pain: Pain Assessment Pain Scale: 0-10 Pain Score: 0-No pain   Therapy/Group: Individual Therapy  Valma Cava 06/01/2020, 9:41 AM

## 2020-06-01 NOTE — Progress Notes (Signed)
[facility] PHYSICAL MEDICINE & REHABILITATION PROGRESS NOTE   Subjective/Complaints: Didn't sleep much last night. Few hours he did, was restless per nurse. A little better with OT today but still distracted  ROS: Limited due to cognitive/behavioral    Objective:   No results found. Recent Labs    05/30/20 0035 05/31/20 0602  WBC 6.3 5.9  HGB 9.2* 9.7*  HCT 30.2* 30.9*  PLT 241 263   Recent Labs    05/30/20 0035 05/31/20 0602  NA 138 137  K 3.8 4.0  CL 107 105  CO2 20* 22  GLUCOSE 119* 110*  BUN 30* 26*  CREATININE 1.22 1.26*  CALCIUM 9.0 8.8*    Intake/Output Summary (Last 24 hours) at 06/01/2020 1140 Last data filed at 06/01/2020 0738 Gross per 24 hour  Intake 380 ml  Output 1851 ml  Net -1471 ml        Physical Exam: Vital Signs Blood pressure (!) 114/59, pulse 70, temperature 98.2 F (36.8 C), resp. rate 16, height 6\' 3"  (1.905 m), weight 85.6 kg, SpO2 94 %.  Constitutional: No distress . Vital signs reviewed. HEENT: EOMI, oral membranes moist,NGT Neck: #4 trach with PMV Cardiovascular: RRR without murmur. No JVD    Respiratory/Chest: CTA Bilaterally without wheezes or rales. Normal effort    GI/Abdomen: BS +, non-tender, non-distended Ext: no clubbing, cyanosis, or edema Psych: pleasant, less restless Skin: Clean and intact without signs of breakdown Neuro:  Follows basic commands. Very tangential.  Cranial nerves 2-12 are intact. Sensory exam is normal. Reflexes are 2+ in all 4's. Fine motor coordination is intact. No tremors. Motor function is grossly 4/5 UE and 4- to 4/5 LE's.  Musculoskeletal: Full ROM, No pain with AROM or PROM in the neck, trunk, or extremities. Posture appropriate       Assessment/Plan: 1. Functional deficits which require 3+ hours per day of interdisciplinary therapy in a comprehensive inpatient rehab setting.  Physiatrist is providing close team supervision and 24 hour management of active medical problems listed  below.  Physiatrist and rehab team continue to assess barriers to discharge/monitor patient progress toward functional and medical goals  Care Tool:  Bathing  Bathing activity did not occur: Safety/medical concerns Body parts bathed by patient: Left arm, Right arm, Chest, Abdomen, Face   Body parts bathed by helper: Front perineal area, Buttocks, Right upper leg, Left upper leg, Right lower leg, Left lower leg     Bathing assist Assist Level: Maximal Assistance - Patient 24 - 49%     Upper Body Dressing/Undressing Upper body dressing   What is the patient wearing?: Pull over shirt    Upper body assist Assist Level: Maximal Assistance - Patient 25 - 49%    Lower Body Dressing/Undressing Lower body dressing      What is the patient wearing?: Pants     Lower body assist Assist for lower body dressing: Total Assistance - Patient < 25%     Toileting Toileting Toileting Activity did not occur Landscape architect and hygiene only): N/A (no void or bm) (patient has a rectal tube in- no void)  Toileting assist Assist for toileting: Maximal Assistance - Patient 25 - 49%     Transfers Chair/bed transfer  Transfers assist  Chair/bed transfer activity did not occur: Safety/medical concerns  Chair/bed transfer assist level: Maximal Assistance - Patient 25 - 49%     Locomotion Ambulation   Ambulation assist      Assist level: 2 helpers Assistive device: Parallel bars Max  distance: 8'   Walk 10 feet activity   Assist  Walk 10 feet activity did not occur: Safety/medical concerns        Walk 50 feet activity   Assist Walk 50 feet with 2 turns activity did not occur: Safety/medical concerns         Walk 150 feet activity   Assist Walk 150 feet activity did not occur: Safety/medical concerns         Walk 10 feet on uneven surface  activity   Assist Walk 10 feet on uneven surfaces activity did not occur: Safety/medical concerns          Wheelchair     Assist Will patient use wheelchair at discharge?: Yes Type of Wheelchair: Manual    Wheelchair assist level: Minimal Assistance - Patient > 75% Max wheelchair distance: 20'    Wheelchair 50 feet with 2 turns activity    Assist    Wheelchair 50 feet with 2 turns activity did not occur: Safety/medical concerns       Wheelchair 150 feet activity     Assist  Wheelchair 150 feet activity did not occur: Safety/medical concerns       Blood pressure (!) 114/59, pulse 70, temperature 98.2 F (36.8 C), resp. rate 16, height 6\' 3"  (1.905 m), weight 85.6 kg, SpO2 94 %.  Medical Problem List and Plan: 1.  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  Antithrombotics: -DVT/anticoagulation: Eliquis             -antiplatelet therapy: N/A 3. Pain Management: Lidoderm patch only.    -no narcs d/t delirium             Monitor with increased exertion 4. Mood: Provide emotional support             -antipsychotic agents: add Seroquel 25 mg nightly,still not sleeping   -continue sleep chart 5. Neuropsych: This patient is not fully capable of making decisions on his own behalf. 6. Skin/Wound Care: Routine skin checks 7. Fluids/Electrolytes/Nutrition: discussed importance of PO intake with pt.             CMP reviewed, albumin low, ongoing AKI  -added megace for appetite  -protein supps, TF for support  -still not eating much 8.  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  Currently #6 cuffless Shiley in place.  Follow-up per critical care medicine             12/2 downsized to #4 trach cuffless  12/3 cork trach during day 9.  AKI.  Improved.  Renal ultrasound no hydronephrosis.               -add water through NGT 10.  Hypotension.               Monitor with increased mobility. 11.  Atrial  fibrillation.  Lopressor 50 mg twice daily, continue Eliquis.  Cardiac rate controlled.   controlled 12.  Acute on chronic anemia.                         Hgb trending up to 9.7 --recheck next week  13.  COPD with remote history of tobacco abuse.  Continue nebulizers as directed 14.  Hyperlipidemia.  Lipitor 15.  Prostate cancer with prostatectomy 1992.  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  Follow-up urology as well as gastroenterology services.  Patient currently declined endoscopy. 17. Dysphagia: D3 diet    LOS: 2 days A FACE TO FACE EVALUATION WAS PERFORMED  Carlos Olson 06/01/2020, 11:40 AM

## 2020-06-01 NOTE — Care Management (Signed)
[facility] Individual Statement of Services  Patient Name:  Carlos Olson  Date:  06/01/2020  Welcome to the [facility].  Our goal is to provide you with an individualized program based on your diagnosis and situation, designed to meet your specific needs.  With this comprehensive rehabilitation program, you will be expected to participate in at least 3 hours of rehabilitation therapies Monday-Friday, with modified therapy programming on the weekends.  Your rehabilitation program will include the following services:  Physical Therapy (PT), Occupational Therapy (OT), Speech Therapy (ST), 24 hour per day rehabilitation nursing, Therapeutic Recreaction (TR), Psychology, Neuropsychology, Care Coordinator, Rehabilitation Medicine, Nutrition Services, Pharmacy Services and Other  Weekly team conferences will be held on Tuesdays to discuss your progress.  Your Inpatient Rehabilitation Care Coordinator will talk with you frequently to get your input and to update you on team discussions.  Team conferences with you and your family in attendance may also be held.  Expected length of stay: 18-21 days  Overall anticipated outcome: Supervision  Depending on your progress and recovery, your program may change. Your Inpatient Rehabilitation Care Coordinator will coordinate services and will keep you informed of any changes. Your Inpatient Rehabilitation Care Coordinator's name and contact numbers are listed  below.  The following services may also be recommended but are not provided by the [facility] will be made to provide these services after discharge if needed.  Arrangements include referral to agencies that provide these services.  Your insurance has been verified to be:  [employer]  Your  primary doctor is:  Ria Bush  Pertinent information will be shared with your doctor and your insurance company.  Inpatient Rehabilitation Care Coordinator:  Cathleen Corti [phone] or ([phone]  Information discussed with and copy given to patient by: Rana Snare, 06/01/2020, 11:41 AM

## 2020-06-01 NOTE — Progress Notes (Signed)
Pt slept 2-3 hours through out the night. Pt continues to attempt to get out of the bed. Pt oriented to self, but easily reoriented to time/place/situation. Pt very anxious and restless. Trach care was performed by this nurse. Pt resting in bed, call light in reach.

## 2020-06-01 NOTE — Progress Notes (Signed)
Speech Language Pathology Daily Session Note  Patient Details  Name: Carlos Olson MRN: [id number] Date of Birth: 1940/11/30  Today's Date: 06/01/2020 SLP Individual Time: 1315-1400 SLP Individual Time Calculation (min): 45 min  Short Term Goals: Week 1: SLP Short Term Goal 1 (Week 1): Patient will demonstrate orientation to age, place and time with Max verbal and visual cues. SLP Short Term Goal 2 (Week 1): Patient will verbalize 3 cognitive/physical deficits with Max A verbal and visual cues. SLP Short Term Goal 3 (Week 1): Patient will demonstrate sustained attention to functional tasks for 15 minutes with Mod verbal cues for redirection. SLP Short Term Goal 4 (Week 1): Patient will demonstrate functional problem solving for basic and familiar tasks with Max verbal cues. SLP Short Term Goal 5 (Week 1): Patient will consume current diet with minimal overt s/s of aspiration and Min A verbal cues for use of swallowing compensatory strategies. SLP Short Term Goal 6 (Week 1): Patient will demonstrate efficient masticaion and complete oral clearance with trials of regular textures without overt s/s of apiration over 2 sessions prior to upgrade with Min verbal cues.  Skilled Therapeutic Interventions: Pt was seen for skilled ST targeting cognitive goals and education with pt's wife at bedside. Education related to language of confusion, attention, and processing provided to wife, given that she expressed concerns regarding pt's current cognitive status with many questions regarding prognosis, impact of lethargy vs true cognitive deficits, etc. Also provided education regarding pt's current diet recommendations and compensatory strategies, and review of swallow physiology and pathologies reported on MBSS from 11/23. [name] verbalized and demonstrated understanding, therefore, signed off to provide full supervision during meals. SLP also provided pt with a monthly calendar and signage related to call  bell in attempt to aid in orientation and safety throughout his stay. Pt required Mod A verbal cues to sustain attention to functional conversational topics for brief intervals of time and presented with intermittent language of confusion throughout. Moderate verbal and visual cueing also required for functional use of calendar. External aids left posted in room. Pt became increasingly fatigued throughout session and eventually unable to participate further, therefore session ended 15 mins early. Pt left laying in bed with alarm set and needs within reach. Continue per current plan of care.     Pain Pain Assessment Pain Scale: 0-10 Pain Score: 0-No pain  Therapy/Group: Individual Therapy  Arbutus Leas 06/01/2020, 7:21 AM

## 2020-06-02 ENCOUNTER — Inpatient Hospital Stay (HOSPITAL_COMMUNITY): Payer: Medicare HMO | Admitting: Physical Therapy

## 2020-06-02 ENCOUNTER — Inpatient Hospital Stay (HOSPITAL_COMMUNITY): Payer: Medicare HMO

## 2020-06-02 NOTE — Progress Notes (Signed)
Refuse ensure plus ; offered boost orange flavor and patient liked it.

## 2020-06-02 NOTE — Progress Notes (Signed)
°   06/02/20 0236  Therapy Vitals  Pulse Rate 82  Resp 18  MEWS Score/Color  MEWS Score 0  MEWS Score Color Green  Respiratory Assessment  Assessment Type Assess only  Respiratory Pattern Regular;Unlabored  Chest Assessment Chest expansion symmetrical  Bilateral Breath Sounds Diminished  Oxygen Therapy/Pulse Ox  O2 Device Room Air;Tracheostomy Collar  O2 Therapy Room air  FiO2 (%) 21 %  SpO2 96 %  Tracheostomy Shiley Flexible 4 mm Uncuffed  Placement Date/Time: 05/31/20 1518   Placed By: Self  Brand: Shiley Flexible  Size (mm): 4 mm  Style: Uncuffed  Status Secured (uncapped per md order)  Site Assessment Clean  Site Care Cleansed  Cuff pressure (cm)  (no value)  pt. Trach uncapped per md order at night

## 2020-06-02 NOTE — Progress Notes (Signed)
Speech Language Pathology Daily Session Note  Patient Details  Name: Carlos Olson MRN: [id number] Date of Birth: [birth year]  Today's Date: 06/02/2020 SLP Individual Time: 1300-1345 SLP Individual Time Calculation (min): 45 min  Short Term Goals: Week 1: SLP Short Term Goal 1 (Week 1): Patient will demonstrate orientation to age, place and time with Max verbal and visual cues. SLP Short Term Goal 2 (Week 1): Patient will verbalize 3 cognitive/physical deficits with Max A verbal and visual cues. SLP Short Term Goal 3 (Week 1): Patient will demonstrate sustained attention to functional tasks for 15 minutes with Mod verbal cues for redirection. SLP Short Term Goal 4 (Week 1): Patient will demonstrate functional problem solving for basic and familiar tasks with Max verbal cues. SLP Short Term Goal 5 (Week 1): Patient will consume current diet with minimal overt s/s of aspiration and Min A verbal cues for use of swallowing compensatory strategies. SLP Short Term Goal 6 (Week 1): Patient will demonstrate efficient masticaion and complete oral clearance with trials of regular textures without overt s/s of apiration over 2 sessions prior to upgrade with Min verbal cues.  Skilled Therapeutic Interventions: Skilled SLP intervention focused on cognition. Daughter present at beginning of session. Pt completed sustained attention card task with Max A verbal cues of SLP repeating information and giving visual cues. Pt demonstrated good recall of medical situation prior to admission to hospital and was able to communicate this coherently to SLP. Pt unable to recall information he told SLP at beginning of session and needed moderate cues to increase recall. Daughter reported less confusing speech today and improved orientation to situation and date. Cont with therapy per plan of care.      Pain Pain Assessment Pain Scale: Faces Pain Score: 3  Faces Pain Scale: No hurt  Therapy/Group: Individual  Therapy  Carlos Olson Carlos Olson 06/02/2020, 1:47 PM

## 2020-06-02 NOTE — Plan of Care (Signed)
  Problem: RH BOWEL ELIMINATION Goal: RH STG MANAGE BOWEL WITH ASSISTANCE Description: STG Manage Bowel with min Assistance. Outcome: Not Progressing; rectal tube due to fistula   Problem: RH BLADDER ELIMINATION Goal: RH STG MANAGE BLADDER WITH ASSISTANCE Description: STG Manage Bladder With min Assistance Outcome: Not Progressing; incontinence; rectal tube due to fistula   Problem: RH SAFETY Goal: RH STG ADHERE TO SAFETY PRECAUTIONS W/ASSISTANCE/DEVICE Description: STG Adhere to Safety Precautions With min Assistance/Device. Outcome: Not Progressing; telesitter   Problem: RH KNOWLEDGE DEFICIT GENERAL Goal: RH STG INCREASE KNOWLEDGE OF SELF CARE AFTER HOSPITALIZATION Description: Patient will be able to demonstrate knowledge of medication manage, dietary restrictions, safety precautions, with handouts, education materials and min assist from staff. Outcome: Not Progressing; delerium; confusion

## 2020-06-02 NOTE — Progress Notes (Signed)
Physical Therapy Session Note  Patient Details  Name: Carlos Olson MRN: [id number] Date of Birth: January 18, 1941  Today's Date: 06/02/2020 PT Individual Time: 8938-1017 and 0800-0915 PT Individual Time Calculation (min): 30 min and 51mn   Short Term Goals: Week 1:  PT Short Term Goal 1 (Week 1): Patient to perfom bed mobility with min A 50% of the time PT Short Term Goal 2 (Week 1): Patient to perform sit to stand transfers with mod A PT Short Term Goal 4 (Week 1): Patient to ambulate 151 with RW and mod A  Skilled Therapeutic Interventions/Progress Updates:    session 1: pt received semi recline in bed, asleep, easily aroused. Pt pleasantly confused as he believes it is the afternoon upon first awakening. Oriented to day and time by PT. Pt reports some pain in his low back, but does not rate. PT donned ted hose in bed with total assist and pants with max assist with pt cueing to roll to each side. Able to roll with min assist. Supine>sit min assist for LE management and negotiation of rectal tube. Squat pivot to chair on R min assist. Pt assisted with breakfast setup and sequencing as recommended. Pt needs continual cueing for alternating foods and liquids and clearing his throat prior to following bite. Pt transported in wc to main therapy gym for time management and energy conservation. Squat pivot to mat and scooting to R with min assist.  Standing Balance: -horseshoes while standing at RW x3 sets. Sit>stand min assist with increased time. Static standing with RW CGA. While throwing horseshoes with R hand, pt requiring min assist in order to maintain standing position due to increased hip/knee flexion. Pt responds well when asked to stop and correct prior to throwing. Progressed to CGA/close supervision during last set. Stand pivot with RW and CGA back to wc.   WC Propulsion: -Pt propelled wc in hallway ~483fwith supervision, however fatigued at this distance.   Pt transported to room  total assist. Stand pivot transfer to bed with CGA and RW. Sit>supine min assist for LE management and repositioning in bed. Pt left semi reclined with call bell in reach, needs met, and bed alarm on.   All sit>stands at min assist throughout session.   Session 2: Pt received supine in bed and agreeable to PT. Daughter present. Pt orthostatics taken with results below: Supine: BP 119/67, HR 85 Sitting: BP 135/60, HR 87 - one posterior LOB in seated position Standing: BP 135/71, HR 89  Supine>sit min assist for upright posture and LE management. Pt states that he feels that he needs "to be cleaned up again". In standing with RW and CGA by +2, PT performed posterior pericare total assist and doffed/donned brief with +2 for safety. Pt returned to sitting EOB and performed lateral scooting to L with supervision. Pt returned to supine with min assist for LE management and repositioning to comfort. Pt left with bed alarm on, semi reclined with call bell in reach and needs met. Pt pleasantly confused upon PT departure asking [place] knock on the door behind his bed prior to leaving. Pt states that his back causes him much pain when moving around, but does not rate pain at this time.   Nursing consulted about leaking rectal tube and suggested use of hemorrhoid pillow to allow for tube to sit without kinking.    Therapy Documentation Precautions:  Precautions Precautions: Fall Precaution Comments: trach, flexiseal Restrictions Weight Bearing Restrictions: No Vital Signs: Therapy Vitals Pulse  Rate: 96 BP: 127/61 Oxygen Therapy SpO2: 92 % Pain: Pain Assessment Pain Scale: Faces Pain Score: 3  Faces Pain Scale: Hurts whole lot Pain Type: Acute pain Pain Location: Back Pain Orientation: Lower Pain Descriptors / Indicators: Aching Pain Frequency: Constant Pain Onset: On-going Pain Intervention(s): Medication (See eMAR)   Therapy/Group: Individual Therapy  Gaylord Shih, SPT 06/02/2020,  12:17 PM

## 2020-06-02 NOTE — Progress Notes (Signed)
[facility] PHYSICAL MEDICINE & REHABILITATION PROGRESS NOTE   Subjective/Complaints: Slept much better last night. Just waking up when I saw him this morning.   ROS: Limited due to cognitive/behavioral    Objective:   No results found. Recent Labs    05/31/20 0602  WBC 5.9  HGB 9.7*  HCT 30.9*  PLT 263   Recent Labs    05/31/20 0602  NA 137  K 4.0  CL 105  CO2 22  GLUCOSE 110*  BUN 26*  CREATININE 1.26*  CALCIUM 8.8*    Intake/Output Summary (Last 24 hours) at 06/02/2020 1046 Last data filed at 06/02/2020 0600 Gross per 24 hour  Intake 5198.75 ml  Output 300 ml  Net 4898.75 ml        Physical Exam: Vital Signs Blood pressure 127/61, pulse 96, temperature 98.2 F (36.8 C), temperature source Oral, resp. rate 18, height 6\' 3"  (1.905 m), weight 86.1 kg, SpO2 92 %.  Constitutional: No distress . Vital signs reviewed. HEENT: EOMI, oral membranes moist Neck: supple Cardiovascular: RRR without murmur. No JVD    Respiratory/Chest: CTA Bilaterally without wheezes or rales. Normal effort    GI/Abdomen: BS +, non-tender, non-distended Ext: no clubbing, cyanosis, or edema Psych: pleasant and cooperative, tangential Skin: Clean and intact without signs of breakdown Neuro:  Follows commands, still tangential. Cranial nerves 2-12 are intact. Sensory exam is normal. Reflexes are 2+ in all 4's. Fine motor coordination is intact. No tremors. Motor function is grossly 4/5 UE and 4- to 4/5 LE's.  Musculoskeletal: Full ROM, No pain with AROM or PROM in the neck, trunk, or extremities. Posture appropriate       Assessment/Plan: 1. Functional deficits which require 3+ hours per day of interdisciplinary therapy in a comprehensive inpatient rehab setting.  Physiatrist is providing close team supervision and 24 hour management of active medical problems listed below.  Physiatrist and rehab team continue to assess barriers to discharge/monitor patient progress toward  functional and medical goals  Care Tool:  Bathing  Bathing activity did not occur: Safety/medical concerns Body parts bathed by patient: Left arm, Right arm, Chest, Abdomen, Face   Body parts bathed by helper: Front perineal area, Buttocks, Right upper leg, Left upper leg, Right lower leg, Left lower leg     Bathing assist Assist Level: Maximal Assistance - Patient 24 - 49%     Upper Body Dressing/Undressing Upper body dressing   What is the patient wearing?: Pull over shirt    Upper body assist Assist Level: Maximal Assistance - Patient 25 - 49%    Lower Body Dressing/Undressing Lower body dressing      What is the patient wearing?: Pants     Lower body assist Assist for lower body dressing: Total Assistance - Patient < 25%     Toileting Toileting Toileting Activity did not occur Landscape architect and hygiene only): N/A (no void or bm) (patient has a rectal tube in- no void)  Toileting assist Assist for toileting: Maximal Assistance - Patient 25 - 49%     Transfers Chair/bed transfer  Transfers assist  Chair/bed transfer activity did not occur: Safety/medical concerns  Chair/bed transfer assist level: Maximal Assistance - Patient 25 - 49%     Locomotion Ambulation   Ambulation assist      Assist level: 2 helpers Assistive device: Walker-Eva Max distance: 40   Walk 10 feet activity   Assist  Walk 10 feet activity did not occur: Safety/medical concerns  Assist level: 2 helpers  Assistive device: Walker-Eva   Walk 50 feet activity   Assist Walk 50 feet with 2 turns activity did not occur: Safety/medical concerns         Walk 150 feet activity   Assist Walk 150 feet activity did not occur: Safety/medical concerns         Walk 10 feet on uneven surface  activity   Assist Walk 10 feet on uneven surfaces activity did not occur: Safety/medical concerns         Wheelchair     Assist Will patient use wheelchair at discharge?:  Yes Type of Wheelchair: Manual    Wheelchair assist level: Minimal Assistance - Patient > 75% Max wheelchair distance: 20'    Wheelchair 50 feet with 2 turns activity    Assist    Wheelchair 50 feet with 2 turns activity did not occur: Safety/medical concerns       Wheelchair 150 feet activity     Assist  Wheelchair 150 feet activity did not occur: Safety/medical concerns       Blood pressure 127/61, pulse 96, temperature 98.2 F (36.8 C), temperature source Oral, resp. rate 18, height 6\' 3"  (1.905 m), weight 86.1 kg, SpO2 92 %.  Medical Problem List and Plan: 1.  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  Antithrombotics: -DVT/anticoagulation: Eliquis             -antiplatelet therapy: N/A 3. Pain Management: Lidoderm patch only.    -no narcs d/t delirium             Monitor with increased exertion 4. Mood: Provide emotional support             -antipsychotic agents: aded Seroquel 25 mg nightly for sleep which helped   -continue sleep chart 5. Neuropsych: This patient is not fully capable of making decisions on his own behalf. 6. Skin/Wound Care: Routine skin checks 7. Fluids/Electrolytes/Nutrition:               CMP reviewed, albumin low, ongoing AKI  -added megace for appetite--not much benefit yet---25%  -protein supps, TF for support    8.  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  Currently #6 cuffless Shiley in place.  Follow-up per critical care medicine             12/2 downsized to #4 trach cuffless  12/3-4 cork trach during day, open at night. Doing well so far. If uneventful today will cork trach 24hr/day 9.  AKI.  Improved.  Renal ultrasound no hydronephrosis.               -added water through NGT 10.  Hypotension.               Monitor with increased mobility. 11.  Atrial  fibrillation.  Lopressor 50 mg twice daily, continue Eliquis.  Cardiac rate controlled.   controlled 12.  Acute on chronic anemia.                         Hgb trending up to 9.7 --recheck next week  13.  COPD with remote history of tobacco abuse.  Continue nebulizers as directed 14.  Hyperlipidemia.  Lipitor 15.  Prostate cancer with prostatectomy 1992.  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  Follow-up urology as well as gastroenterology services.  Patient currently declined endoscopy. 17. Dysphagia: D3 diet    LOS: 3 days A FACE TO FACE EVALUATION WAS PERFORMED  Meredith Staggers 06/02/2020, 10:46 AM

## 2020-06-02 NOTE — IPOC Note (Signed)
Overall Plan of Care [facility]) Patient Details Name: Carlos Olson MRN: [id number] DOB: 09/19/1940  Admitting Diagnosis: Debility  Hospital Problems: Principal Problem:   Debility     Functional Problem List: Nursing Bladder, Bowel, Skin Integrity, Endurance, Nutrition, Pain, Motor, Safety, Medication Management  PT Balance, Behavior, Endurance, Motor, Safety  OT Balance, Behavior, Cognition, Endurance, Motor, Perception, Pain, Safety, Sensory  SLP Cognition, Nutrition  TR         Basic ADL's: OT Eating, Grooming, Bathing, Dressing, Toileting     Advanced  ADL's: OT       Transfers: PT Bed Mobility, Bed to Chair, Car  OT Tub/Shower, Toilet     Locomotion: PT Ambulation, Emergency planning/management officer, Stairs     Additional Impairments: OT Fuctional Use of Upper Extremity  SLP Swallowing, Social Cognition   Social Interaction, Problem Solving, Memory, Attention, Awareness  TR      Anticipated Outcomes Item Anticipated Outcome  Self Feeding Set-up A  Swallowing  Mod I   Basic self-care  CGA/supervision  Toileting  CGA   Bathroom Transfers CGA/supervision  Bowel/Bladder  Pt will manage bowel and bladder with min assist  Transfers  supervision  Locomotion  Supervision ambulation in the home, min A w/c community  Communication     Cognition  Min A  Pain  Pt will manage pain at 3 or less on a scale of 0-10  Safety/Judgment  Pt will remain free of falls and injury while in rehab   Therapy Plan: PT Intensity: Minimum of 1-2 x/day ,45 to 90 minutes PT Frequency: 5 out of 7 days PT Duration Estimated Length of Stay: 18-21 days OT Intensity: Minimum of 1-2 x/day, 45 to 90 minutes OT Frequency: 5 out of 7 days OT Duration/Estimated Length of Stay: 21-24 days SLP Intensity: Minumum of 1-2 x/day, 30 to 90 minutes SLP Frequency: 3 to 5 out of 7 days SLP Duration/Estimated Length of Stay: 3 weeks   Due to the current state of emergency, patients may not be receiving  their 3-hours of Medicare-mandated therapy.   Team Interventions: Nursing Interventions Patient/Family Education, Bladder Management, Bowel Management, Disease Management/Prevention, Medication Management, Pain Management, Skin Care/Wound Management, Discharge Planning  PT interventions Ambulation/gait training, DME/adaptive equipment instruction, Balance/vestibular training, Cognitive remediation/compensation, Functional mobility training, Patient/family education, Therapeutic Exercise, Therapeutic Activities, Discharge planning, Neuromuscular re-education, Stair training, UE/LE Coordination activities, UE/LE Strength taining/ROM, Wheelchair propulsion/positioning  OT Interventions Cognitive remediation/compensation, Training and development officer, Community reintegration, Discharge planning, Disease mangement/prevention, DME/adaptive equipment instruction, Functional electrical stimulation, Functional mobility training, Neuromuscular re-education, Pain management, Patient/family education, Psychosocial support, Self Care/advanced ADL retraining, Skin care/wound managment, Splinting/orthotics, Therapeutic Activities, UE/LE Strength taining/ROM, Therapeutic Exercise, Visual/perceptual remediation/compensation, UE/LE Coordination activities, Wheelchair propulsion/positioning  SLP Interventions Dysphagia/aspiration precaution training, Cognitive remediation/compensation, Internal/external aids, Therapeutic Activities, Environmental controls, Cueing hierarchy, Functional tasks, Patient/family education  TR Interventions    SW/CM Interventions Discharge Planning, Psychosocial Support, Patient/Family Education   Barriers to Discharge MD  Medical stability  Nursing      PT Trach, Behavior, Incontinence, Other (comments) (flexiseal)    OT      SLP      SW       Team Discharge Planning: Destination: PT-Home ,OT- Home , SLP-Home Projected Follow-up: PT-Home health PT, 24 hour supervision/assistance, OT-   Home health OT, SLP-24 hour supervision/assistance, Home Health SLP Projected Equipment Needs: PT-Rolling walker with 5" wheels, Wheelchair (measurements), Wheelchair cushion (measurements), OT- To be determined, SLP-None recommended by SLP Equipment Details: PT-16x20" w/c with pressure relieving cushion, OT-  Patient/family involved in discharge planning: PT- Patient, Family member/caregiver,  OT-Patient, Family member/caregiver, SLP-Patient  MD ELOS: 18-21 days Medical Rehab Prognosis:  Good Assessment: The patient has been admitted for CIR therapies with the diagnosis of debility and encephalopathy after hospitalization for multiple medical issues. The team will be addressing functional mobility, strength, stamina, balance, safety, adaptive techniques and equipment, self-care, bowel and bladder mgt, patient and caregiver education, NMR, speech, trach mgt, swallowing, cognition, community reentry. Goals have been set at supervision for self-care and mobility, mod I for swallowing and communication, min assist for cognition.   Due to the current state of emergency, patients may not be receiving their 3 hours per day of Medicare-mandated therapy.    Meredith Staggers, MD, FAAPMR      See Team Conference Notes for weekly updates to the plan of care

## 2020-06-03 ENCOUNTER — Inpatient Hospital Stay (HOSPITAL_COMMUNITY): Payer: Medicare HMO

## 2020-06-03 MED ORDER — VITAL 1.5 CAL PO LIQD
900.0000 mL | ORAL | Status: DC
  Administered 2020-06-03 – 2020-06-04 (×2): 900 mL
  Filled 2020-06-03 (×3): qty 948

## 2020-06-03 MED ORDER — BOOST / RESOURCE BREEZE PO LIQD CUSTOM
1.0000 | Freq: Two times a day (BID) | ORAL | Status: DC
  Administered 2020-06-03 – 2020-06-05 (×4): 1 via ORAL
  Filled 2020-06-03: qty 1

## 2020-06-03 NOTE — Progress Notes (Signed)
Occupational Therapy Session Note  Patient Details  Name: Carlos Olson MRN: [id number] Date of Birth: June 18, 1941  Today's Date: 06/03/2020 OT Individual Time: 1450-1552 OT Individual Time Calculation (min): 62 min     Short Term Goals: Week 1:  OT Short Term Goal 1 (Week 1): Patient will complete toilet transfer with max A of 1 OT Short Term Goal 2 (Week 1): Patient will complete 1 step of LB dressing task OT Short Term Goal 3 (Week 1): Pt will maintain sitting balance at EOB with no more than mod A within BADL task  Skilled Therapeutic Interventions/Progress Updates:    1:1. Pt received in bed agreeable to OT with no pain reported and pleasantly confused thinking daughter is wife. Daughter present at beginning and end of session. Pt requires MOD A for supine>sitting EOB with VC for sequencing and A to manage rectal tube. Pt requires initially up to MAX A for sitting balance at EOB with 2 total LOB backwards requiring MOD A to adjust. Despite encouraging forward reaching with bathing at EOB, pt continues to lose balance backwards. Pt with improved positioning after touching hands to shins for 2 min holding position, pt able to maintain midline with S. Pt completes UB dressing with MOD A for threading second UE and pulling down back. Pt able to thread Ble into pants with MIN A and VC for continuation of task/pulling pants up from the back. Pt completes sit to stand at EOB 3x with MIN A and VC for hand placement for power up. Pt able to maintain balance with CGA while OT advances pants past hips and step/turn to w/c with RW. Pt sits at sink to shave with electric razor completing 60% of face electing to do the rest at a later time. Pt HR <100 throughout session and O2 >95% despite elevated RR. Pt completes 30 ft of functional mobility with RW and chair follow from daughter with min-MOD A Overall and narrow BOS with little correction despite cues. Pants doffed prior to positioning in bed.  Exited  session with pt seated in bed, exit alarm on and call light in reach   Therapy Documentation Precautions:  Precautions Precautions: Fall Precaution Comments: trach, flexiseal Restrictions Weight Bearing Restrictions: No General:   Vital Signs:   Pain: Pain Assessment Pain Scale: 0-10 Pain Score: 3  Pain Type: Acute pain Pain Location: Back Pain Orientation: Lower Pain Descriptors / Indicators: Aching Pain Frequency: Constant Pain Onset: On-going Pain Intervention(s): Medication (See eMAR) ADL: ADL Eating: Unable to assess Grooming: Unable to assess Upper Body Bathing: Unable to assess Lower Body Bathing: Unable to assess Upper Body Dressing: Maximal assistance Lower Body Dressing: Dependent Toileting: Unable to assess Toilet Transfer: Unable to assess Tub/Shower Transfer: Unable to assess Vision   Perception    Praxis   Exercises:   Other Treatments:     Therapy/Group: Individual Therapy  Tonny Branch 06/03/2020, 12:22 PM

## 2020-06-03 NOTE — Progress Notes (Signed)
[place] PHYSICAL MEDICINE & REHABILITATION PROGRESS NOTE   Subjective/Complaints: Had another good night of sleep. Tolerating trach corking during the day without problems  ROS: Patient denies fever, rash, sore throat, blurred vision, nausea, vomiting, diarrhea, cough, shortness of breath or chest pain, joint or back pain, headache, or mood change.    Objective:   No results found. No results for input(s): WBC, HGB, HCT, PLT in the last 72 hours. No results for input(s): NA, K, CL, CO2, GLUCOSE, BUN, CREATININE, CALCIUM in the last 72 hours.  Intake/Output Summary (Last 24 hours) at 06/03/2020 1039 Last data filed at 06/03/2020 0700 Gross per 24 hour  Intake 2805 ml  Output --  Net 2805 ml        Physical Exam: Vital Signs Blood pressure (!) 127/56, pulse 82, temperature 98.4 F (36.9 C), resp. rate 18, height 6\' 3"  (1.905 m), weight 85.2 kg, SpO2 94 %.  Constitutional: No distress . Vital signs reviewed. HEENT: EOMI, oral membranes moist Neck: supple Cardiovascular: RRR without murmur. No JVD    Respiratory/Chest: CTA Bilaterally without wheezes or rales. Normal effort    GI/Abdomen: BS +, non-tender, non-distended Ext: no clubbing, cyanosis, or edema Psych: pleasant and cooperative, less tangential Skin: Clean and intact without signs of breakdown Neuro:  Alert and more attentive.  Cranial nerves 2-12 are intact. Sensory exam is normal. Reflexes are 2+ in all 4's. Fine motor coordination is intact. No tremors. Motor function is grossly 4/5 UE and 4- to 4/5 LE's.  Musculoskeletal: Full ROM, No pain with AROM or PROM in the neck, trunk, or extremities. Posture appropriate       Assessment/Plan: 1. Functional deficits which require 3+ hours per day of interdisciplinary therapy in a comprehensive inpatient rehab setting.  Physiatrist is providing close team supervision and 24 hour management of active medical problems listed below.  Physiatrist and rehab team  continue to assess barriers to discharge/monitor patient progress toward functional and medical goals  Care Tool:  Bathing  Bathing activity did not occur: Safety/medical concerns Body parts bathed by patient: Left arm, Right arm, Chest, Abdomen, Face   Body parts bathed by helper: Front perineal area, Buttocks, Right upper leg, Left upper leg, Right lower leg, Left lower leg     Bathing assist Assist Level: Maximal Assistance - Patient 24 - 49%     Upper Body Dressing/Undressing Upper body dressing   What is the patient wearing?: Pull over shirt    Upper body assist Assist Level: Maximal Assistance - Patient 25 - 49%    Lower Body Dressing/Undressing Lower body dressing      What is the patient wearing?: Pants     Lower body assist Assist for lower body dressing: Total Assistance - Patient < 25%     Toileting Toileting Toileting Activity did not occur Landscape architect and hygiene only): N/A (no void or bm) (patient has a rectal tube in- no void)  Toileting assist Assist for toileting: Maximal Assistance - Patient 25 - 49%     Transfers Chair/bed transfer  Transfers assist  Chair/bed transfer activity did not occur: Safety/medical concerns  Chair/bed transfer assist level: Maximal Assistance - Patient 25 - 49%     Locomotion Ambulation   Ambulation assist      Assist level: 2 helpers Assistive device: Walker-Eva Max distance: 40   Walk 10 feet activity   Assist  Walk 10 feet activity did not occur: Safety/medical concerns  Assist level: 2 helpers Assistive device: [employer]  Walk 50 feet activity   Assist Walk 50 feet with 2 turns activity did not occur: Safety/medical concerns         Walk 150 feet activity   Assist Walk 150 feet activity did not occur: Safety/medical concerns         Walk 10 feet on uneven surface  activity   Assist Walk 10 feet on uneven surfaces activity did not occur: Safety/medical concerns          Wheelchair     Assist Will patient use wheelchair at discharge?: Yes Type of Wheelchair: Manual    Wheelchair assist level: Minimal Assistance - Patient > 75% Max wheelchair distance: 20'    Wheelchair 50 feet with 2 turns activity    Assist    Wheelchair 50 feet with 2 turns activity did not occur: Safety/medical concerns       Wheelchair 150 feet activity     Assist  Wheelchair 150 feet activity did not occur: Safety/medical concerns       Blood pressure (!) 127/56, pulse 82, temperature 98.4 F (36.9 C), resp. rate 18, height 6\' 3"  (1.905 m), weight 85.2 kg, SpO2 94 %.  Medical Problem List and Plan: 1.  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  Antithrombotics: -DVT/anticoagulation: Eliquis             -antiplatelet therapy: N/A 3. Pain Management: Lidoderm patch only.    -no narcs d/t delirium             Monitor with increased exertion 4. Mood: Provide emotional support             -antipsychotic agents: aded Seroquel 25 mg nightly for sleep which helped   -continue sleep chart 5. Neuropsych: This patient is not fully capable of making decisions on his own behalf. 6. Skin/Wound Care: Routine skin checks 7. Fluids/Electrolytes/Nutrition:               CMP reviewed, albumin low, ongoing AKI  -added megace for appetite--seems to be picking up 25-90% now  -continue protein supps  12/5 -will decrease TF to boost PO appetite    8.  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  Currently #6 cuffless Shiley in place.  Follow-up per critical care medicine             12/2 downsized to #4 trach cuffless  12/3-4 cork trach during day, open at night. Doing well so far. If uneventful today will cork trach 24hr/day  12/5- cork trach 24hr/day--->decannulate Tuesday potentially 9.  AKI.  Improved.   Renal ultrasound no hydronephrosis.               -added water through NGT 10.  Hypotension.               Monitor with increased mobility. 11.  Atrial fibrillation.  Lopressor 50 mg twice daily, continue Eliquis.  Cardiac rate controlled.   controlled 12.  Acute on chronic anemia.                         Hgb trending up to 9.7 --recheck next week  13.  COPD with remote history of tobacco  abuse.  Continue nebulizers as directed 14.  Hyperlipidemia.  Lipitor 15.  Prostate cancer with prostatectomy 1992.  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  Follow-up urology as well as gastroenterology services.  Patient currently declined endoscopy. 17. Dysphagia: D3 diet, tolerating    LOS: 4 days A FACE TO FACE EVALUATION WAS PERFORMED  Meredith Staggers 06/03/2020, 10:39 AM

## 2020-06-03 NOTE — Plan of Care (Signed)
  Problem: RH BOWEL ELIMINATION Goal: RH STG MANAGE BOWEL WITH ASSISTANCE Description: STG Manage Bowel with min Assistance. Outcome: Not Progressing; flexiseal   Problem: RH BLADDER ELIMINATION Goal: RH STG MANAGE BLADDER WITH ASSISTANCE Description: STG Manage Bladder With min Assistance Outcome: Not Progressing; rectal tube; fistula

## 2020-06-03 NOTE — Progress Notes (Addendum)
Patient has been having incontinence / leakage of stool around flexiseal several times during the day. CN and RN checked flexiseal and noted to be intact and balloon is intact as well.CN suggest placing a consult to Ostomy for  recommendations how to prevent leakage of stool.

## 2020-06-03 NOTE — Progress Notes (Signed)
Per CN  Shilley #4 cuffless inner cannula not available; will let incoming shift aware.

## 2020-06-04 ENCOUNTER — Encounter (HOSPITAL_COMMUNITY): Payer: Medicare HMO | Admitting: Psychology

## 2020-06-04 ENCOUNTER — Inpatient Hospital Stay (HOSPITAL_COMMUNITY): Payer: Medicare HMO

## 2020-06-04 ENCOUNTER — Encounter: Payer: Self-pay | Admitting: Family Medicine

## 2020-06-04 ENCOUNTER — Inpatient Hospital Stay (HOSPITAL_COMMUNITY): Payer: Medicare HMO | Admitting: Occupational Therapy

## 2020-06-04 ENCOUNTER — Inpatient Hospital Stay (HOSPITAL_COMMUNITY): Payer: Medicare HMO | Admitting: Speech Pathology

## 2020-06-04 DIAGNOSIS — G931 Anoxic brain damage, not elsewhere classified: Secondary | ICD-10-CM

## 2020-06-04 LAB — CBC WITH DIFFERENTIAL/PLATELET
Abs Immature Granulocytes: 0.03 10*3/uL (ref 0.00–0.07)
Basophils Absolute: 0.1 10*3/uL (ref 0.0–0.1)
Basophils Relative: 1 %
Eosinophils Absolute: 0.3 10*3/uL (ref 0.0–0.5)
Eosinophils Relative: 5 %
HCT: 31.6 % — ABNORMAL LOW (ref 39.0–52.0)
Hemoglobin: 10.2 g/dL — ABNORMAL LOW (ref 13.0–17.0)
Immature Granulocytes: 0 %
Lymphocytes Relative: 9 %
Lymphs Abs: 0.7 10*3/uL (ref 0.7–4.0)
MCH: 31 pg (ref 26.0–34.0)
MCHC: 32.3 g/dL (ref 30.0–36.0)
MCV: 96 fL (ref 80.0–100.0)
Monocytes Absolute: 0.9 10*3/uL (ref 0.1–1.0)
Monocytes Relative: 12 %
Neutro Abs: 5.6 10*3/uL (ref 1.7–7.7)
Neutrophils Relative %: 73 %
Platelets: 250 10*3/uL (ref 150–400)
RBC: 3.29 MIL/uL — ABNORMAL LOW (ref 4.22–5.81)
RDW: 15.7 % — ABNORMAL HIGH (ref 11.5–15.5)
WBC: 7.6 10*3/uL (ref 4.0–10.5)
nRBC: 0 % (ref 0.0–0.2)

## 2020-06-04 LAB — COMPREHENSIVE METABOLIC PANEL
ALT: 23 U/L (ref 0–44)
AST: 20 U/L (ref 15–41)
Albumin: 2.4 g/dL — ABNORMAL LOW (ref 3.5–5.0)
Alkaline Phosphatase: 132 U/L — ABNORMAL HIGH (ref 38–126)
Anion gap: 9 (ref 5–15)
BUN: 23 mg/dL (ref 8–23)
CO2: 18 mmol/L — ABNORMAL LOW (ref 22–32)
Calcium: 9.3 mg/dL (ref 8.9–10.3)
Chloride: 109 mmol/L (ref 98–111)
Creatinine, Ser: 1.03 mg/dL (ref 0.61–1.24)
GFR, Estimated: >60 mL/min (ref >60)
Glucose, Bld: 117 mg/dL — ABNORMAL HIGH (ref 70–99)
Potassium: 4.3 mmol/L (ref 3.5–5.1)
Sodium: 136 mmol/L (ref 135–145)
Total Bilirubin: 0.7 mg/dL (ref 0.3–1.2)
Total Protein: 5.9 g/dL — ABNORMAL LOW (ref 6.5–8.1)

## 2020-06-04 NOTE — Progress Notes (Signed)
Have tried to urine for U/A, pt refused to be cath'd, ordered foley bag for condom cath, will attempt to get urine that way. Rectal tube still in place draining brown liquid, replaced bag and inspected skin in and around tube, no break down noted, will inform [place].

## 2020-06-04 NOTE — Progress Notes (Signed)
Occupational Therapy Session Note  Patient Details  Name: Carlos Olson MRN: [id number] Date of Birth: 1940-11-20  Today's Date: 06/04/2020 OT Individual Time: 1100-1230 OT Individual Time Calculation (min): 90 min   Short Term Goals: Week 1:  OT Short Term Goal 1 (Week 1): Patient will complete toilet transfer with max A of 1 OT Short Term Goal 2 (Week 1): Patient will complete 1 step of LB dressing task OT Short Term Goal 3 (Week 1): Pt will maintain sitting balance at EOB with no more than mod A within BADL task  Skilled Therapeutic Interventions/Progress Updates:    Patient greeted semi-reclined in bed, trying to get LEs out of bed and spouse trying to encourage pt to stay in bed. Pt completed bed mobility with mod A. Stand-pivot to wc with mod A. Pt brought to the sink for BADL tasks. UB bathing completed with focus on use of L hand. Pt frequently dropping wash cloth with L hand and unaware. Pt needed max A to thread pant legs and mod A for sit<>stand. Pt then needed max A to pull up pants and maintain standing balance. UB there-ex with ex propulsion ~20 feet  And multiple rest breaks.OT exchanged wc for one with a higher back as pt is very tall. Stand-pivot to new wc with mod A. Visual scanning and L finger isolation with BITS system. Pt needed guided A to reach dots with L hand 50% of the time. Progressed to working on memory with 3 word recall using BITS system. UB there-ex with 5 mins x2 on UE Ergometer. Pt needed OT assist to maintain grasp on handle with L hand. Pt returned to room and completed stand-pivot back to bed with mod A. Pt left semi-reclined in bed with bed alarm on, call bell in reach, and spouse present.   Therapy Documentation Precautions:  Precautions Precautions: Fall Precaution Comments: trach, flexiseal Restrictions Weight Bearing Restrictions: No Pain:  Patient denies pain   Therapy/Group: Individual Therapy  Valma Cava 06/04/2020, 12:50 PM

## 2020-06-04 NOTE — Progress Notes (Signed)
[place] PHYSICAL MEDICINE & REHABILITATION PROGRESS NOTE   Subjective/Complaints:  Confused , says he lives at [facility], wants his car keys to go home, we discussed that he is still hospitalized and has a trach and a feeding tube, we discussed that vascular surgery wanted him to come to rehab and that if he did not do therapy his strength would continue to decline  ROS: Patient denies pains or breathing issues  Objective:   No results found. No results for input(s): WBC, HGB, HCT, PLT in the last 72 hours. No results for input(s): NA, K, CL, CO2, GLUCOSE, BUN, CREATININE, CALCIUM in the last 72 hours.  Intake/Output Summary (Last 24 hours) at 06/04/2020 0929 Last data filed at 06/04/2020 0603 Gross per 24 hour  Intake 1391.25 ml  Output 1900 ml  Net -508.75 ml        Physical Exam: Vital Signs Blood pressure 115/66, pulse 74, temperature (!) 97.4 F (36.3 C), resp. rate 20, height 6\' 3"  (1.905 m), weight 85.6 kg, SpO2 96 %.   General: No acute distress Mood and affect are appropriate Heart: Regular rate and rhythm no rubs murmurs or extra sounds Lungs: Clear to auscultation, breathing unlabored, no rales or wheezes Abdomen: Positive bowel sounds, soft nontender to palpation, nondistended Extremities: No clubbing, cyanosis, or edema Skin: No evidence of breakdown, no evidence of rash   Musculoskeletal: Full ROM, No pain with AROM or PROM in the neck, trunk, or extremities. Posture appropriate  [place] 4/6 BUE and BLE, sensation intact, tone is normal      Assessment/Plan: 1. Functional deficits which require 3+ hours per day of interdisciplinary therapy in a comprehensive inpatient rehab setting.  Physiatrist is providing close team supervision and 24 hour management of active medical problems listed below.  Physiatrist and rehab team continue to assess barriers to discharge/monitor patient progress toward functional and medical goals  Care Tool:  Bathing   Bathing activity did not occur: Safety/medical concerns Body parts bathed by patient: Left arm, Right arm, Chest, Abdomen, Face   Body parts bathed by helper: Front perineal area, Buttocks, Right upper leg, Left upper leg, Right lower leg, Left lower leg     Bathing assist Assist Level: Maximal Assistance - Patient 24 - 49%     Upper Body Dressing/Undressing Upper body dressing   What is the patient wearing?: Pull over shirt    Upper body assist Assist Level: Maximal Assistance - Patient 25 - 49%    Lower Body Dressing/Undressing Lower body dressing      What is the patient wearing?: Pants     Lower body assist Assist for lower body dressing: Total Assistance - Patient < 25%     Toileting Toileting Toileting Activity did not occur Landscape architect and hygiene only): N/A (no void or bm) (patient has a rectal tube in- no void)  Toileting assist Assist for toileting: Maximal Assistance - Patient 25 - 49%     Transfers Chair/bed transfer  Transfers assist  Chair/bed transfer activity did not occur: Safety/medical concerns  Chair/bed transfer assist level: Maximal Assistance - Patient 25 - 49%     Locomotion Ambulation   Ambulation assist      Assist level: 2 helpers Assistive device: Walker-Eva Max distance: 40   Walk 10 feet activity   Assist  Walk 10 feet activity did not occur: Safety/medical concerns  Assist level: 2 helpers Assistive device: Walker-Eva   Walk 50 feet activity   Assist Walk 50 feet with 2  turns activity did not occur: Safety/medical concerns         Walk 150 feet activity   Assist Walk 150 feet activity did not occur: Safety/medical concerns         Walk 10 feet on uneven surface  activity   Assist Walk 10 feet on uneven surfaces activity did not occur: Safety/medical concerns         Wheelchair     Assist Will patient use wheelchair at discharge?: Yes Type of Wheelchair: Manual    Wheelchair assist  level: Minimal Assistance - Patient > 75% Max wheelchair distance: 20'    Wheelchair 50 feet with 2 turns activity    Assist    Wheelchair 50 feet with 2 turns activity did not occur: Safety/medical concerns       Wheelchair 150 feet activity     Assist  Wheelchair 150 feet activity did not occur: Safety/medical concerns       Blood pressure 115/66, pulse 74, temperature (!) 97.4 F (36.3 C), resp. rate 20, height 6\' 3"  (1.905 m), weight 85.6 kg, SpO2 96 %.  Medical Problem List and Plan: 1.  Debility secondary to AAA repair 93/26/7124 complicated by acute respiratory failure/MSSA pneumonia             -patient may not shower             -ELOS/Goals: Supervision/Mod I 13-17 days             continue PT, OT, SLP 2.  Antithrombotics: -DVT/anticoagulation: Eliquis             -antiplatelet therapy: N/A 3. Pain Management: Lidoderm patch only.    -no narcs d/t delirium             Monitor with increased exertion 4. Mood: Provide emotional support             -antipsychotic agents: aded Seroquel 25 mg nightly for sleep which helped   -continue sleep chart 5. Neuropsych: This patient is not fully capable of making decisions on his own behalf. Neuropsych eval today, appears encephalopathic , he misttook his daughter for wife during OT session 12/5 Neuropsych eval today , check UA C and S, CBC, CMET today , reviewed meds 6. Skin/Wound Care: Routine skin checks 7. Fluids/Electrolytes/Nutrition:               CMP reviewed, albumin low, ongoing AKI  -added megace for appetite--seems to be picking up 25-90% now  -continue protein supps  12/5 -will decrease TF to boost PO appetite    8.  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  Currently #6 cuffless Shiley in place.  Follow-up per critical care medicine             12/2 downsized to #4 trach cuffless  12/3-4 cork trach during day, open at night. Doing well so far. If uneventful today will cork trach 24hr/day  12/5- cork trach  24hr/day--->decannulate Tuesday potentially 9.  AKI.  Improved.  Renal ultrasound no hydronephrosis.               -added water through NGT 10.  Hypotension.               Monitor with increased mobility. 11.  Atrial fibrillation.  Lopressor 50 mg twice daily, continue Eliquis.  Cardiac rate controlled.   controlled 12.  Acute on chronic anemia.  Hgb trending up to 9.7 --recheck next week  13.  COPD with remote history of tobacco abuse.  Continue nebulizers as directed 14.  Hyperlipidemia.  Lipitor 15.  Prostate cancer with prostatectomy 1992.  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  Follow-up urology as well as gastroenterology services.  Patient currently declined endoscopy. 17. Dysphagia: D3 diet, tolerating    LOS: 5 days A FACE TO FACE EVALUATION WAS PERFORMED  Charlett Blake 06/04/2020, 9:29 AM

## 2020-06-04 NOTE — Consult Note (Signed)
Neuropsychological Consultation   Patient:   Carlos Olson   DOB:   1940-09-27  MR Number:  [id number]  Location:  [facility] [id number] [place] [address] Dept: [place]: [phone]           Date of Service:   06/04/2020  Start Time:   10 AM End Time:   11 AM  Provider/Observer:  Ilean Skill, Psy.D.       Clinical Neuropsychologist       Billing Code/Service: 41962  Chief Complaint:    Carlos Olson is an 79 year old male with history of COPD, CAD, AAA status postsurgical repair 04/17/2020, posterior cervical fusion 2015.  Patient presented on 04/17/2020 for elective AAA repair as well as right common femoral artery aneurysm repair per Dr. Donzetta Matters.  Postoperative developed hypotension.  Patient developed delirium felt to be multifactorial with cranial CT scan 04/23/2020 without evidence of acute changes.  Patient with age consistent atrophy and significant microvascular ischemic changes noted.  Admitted to the comprehensive rehabilitation program due to debility and extended hospital course and recovery time.  Reason for Service:  Patient referred for neuropsychological consultation due to confusion and cognitive difficulties that have improved from the delusional state but continued to show significant cognitive deficits.  Below is the HPI for the current admission.  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  History taken from chart review and daughter due to processing.  Patient lives with spouse.  Independent prior to admission.  1 level home with level entry.  Daughter and son also very involved with support.   He presented on 04/17/2020 for elective AAA repair as well as right common femoral artery aneurysm repair per Dr. Donzetta Matters.  Postoperative developed hypotension requiring phenylephrine and cardiology service follow-up.  Most recent echocardiogram reviewed showing EF of 22% grade 1 diastolic dysfunction.  TEE completed showed ejection fraction of 50-55%. No wall motion abnormalities without thrombus.  Noted AKI with creatinine peak 2.7.  Renal ultrasound no hydronephrosis.  Nephrology services consulted for follow-up.  Patient developed abdominal pain concern for mesenteric ischemia gastroenterology services Dr. Wilfrid Lund consulted.  CT abdomen pelvis showed abdominal aortic repair some stranding noted no sizable hematoma seen.  There was noted near complete consolidation of right lower lobe small left pleural effusion seen with left basilar atelectasis.  Patient developed delirium placed on Seroquel felt to be multifactorial with cranial CT scan 04/23/2020 without acute changes.  Patient was ultimately placed on Eliquis for findings of atrial fibrillation.  A nasogastric tube was placed for nutritional support as well as hydration and renal function much improved latest creatinine 1.066.  Patient did require intubation for ventilatory support due to respiratory distress 05/04/2020 with tracheostomy performed 05/09/2020 per critical care medicine Carlos Olson nurse practitioner as well as treated for MSSA PNA. He currently has a cuffless #6 Shiley trach tube in place.  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  There was consideration for endoscopy of which patient declined.  His diet has been advanced to mechanical  soft thin liquid.  Hospital course further complicated by acute blood  loss anemia, 9.6 monitored.  Therapy evaluations completed and patient was admitted for a comprehensive rehab program.  Please see preadmission assessment from earlier today as well.  Current Status:  Upon entering the room, the patient was alert and sitting up in his bed.  Patient's wife came into the room about 20 minutes into our visit.  Patient displayed good expressive and receptive language but there was clear mental status deficits.  Patient was oriented to year but confused as to month and day and confused about basic recent historical changes in his life.  Patient displayed significant circumlocutions as well as confabulation.  Patient got some factual information about his life wrong with lack of awareness.  Patient's wife was able to limit test the patient over specific information and it was clear that there are significant deficits with attention and concentration and awareness in the setting of an individual who is quite intelligent.  He continues to use attempts at being very articulate and knowledgeable to cover up confusion and limitations to his current mental status and acute awareness.  The patient's memory deficits with good long-term memory and impaired short-term memory and confusion are consistent with hypoxic brain injury.  While his receptive and expressive language appears to be generally intact other than circumlocutions for some mild word finding issues there does appear to be significant changes since 04/17/2020.  Executive functioning deficits, attention and concentration deficits and new learning memory deficits all noted during today's clinical interview.  Behavioral Observation: Carlos Olson  presents as a 79 y.o.-year-old Right Caucasian Male who appeared his stated age. his dress was Appropriate and he was Well Groomed and his manners were Appropriate to the situation.  his participation was indicative of Appropriate, Inattentive and Redirectable behaviors.  There were  physical disabilities noted.  he displayed an appropriate level of cooperation and motivation although there are some reports of increased agitation at various times with staff.   Interactions:    Active Inattentive and Redirectable  Attention:   abnormal and attention span appeared shorter than expected for age  Memory:   abnormal; remote memory intact, recent memory impaired  Visuo-spatial:  not examined  Speech (Volume):  normal  Speech:   normal; some word finding and circumlocutions noted no paraphasic errors noted.  Thought Process:  Circumstantial and Tangential  Though Content:  WNL; not suicidal and not homicidal  Orientation:   person and place  Judgment:   Poor  Planning:   Poor  Affect:    Appropriate  Mood:    Euthymic  Insight:   Shallow  Intelligence:   high  Medical History:   Past Medical History:  Diagnosis Date  . AAA (abdominal aortic aneurysm) ([place])   . Atherosclerosis of aortic arch ([place]) 09/25/2015   By CT scan   . CAD (coronary artery disease) 09/25/2015   Mild 3v by CT scan   . Cancer [facility])    prostate  . Colon polyps   . COPD (chronic obstructive pulmonary disease) ([place])   . Diverticulosis of colon   . Femoral bruit    bilateral, found by Dr. Jefm Bryant  . Heart murmur    child  . History of prostate cancer   . IBS (irritable bowel syndrome)    improved after retirement  . 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 Pelvic fracture ([place])   . Pneumonia  child  . Pulmonary nodules 09/25/2015   On screening CT scan, rpt 1 yr   . Shortness of breath    doe         Patient Active Problem List   Diagnosis Date Noted  . Debility 05/30/2020  . Dysphagia   . Acute on chronic anemia   . Paroxysmal atrial fibrillation (HCC)   . Status post tracheostomy ([place])   . Pressure injury of skin 05/16/2020  . Acute on chronic respiratory failure with hypoxia ([place])   . HAP (hospital-acquired pneumonia)    . Colovesical fistula   . Protein-calorie malnutrition, severe 04/29/2020  . AKI (acute kidney injury) ([place])   . AAA (abdominal aortic aneurysm) ([place]) 04/17/2020  . Aortic atherosclerosis ([place]) 03/30/2020  . Osteopenia 03/08/2020  . AAA (abdominal aortic aneurysm) without rupture ([place]) 03/02/2020  . Stiffness of multiple joints 10/10/2019  . Carotid stenosis, asymptomatic, bilateral 02/06/2019  . Vertigo 01/28/2019  . Abrasion of nose 04/23/2018  . Thoracic ascending aortic aneurysm ([place]) 10/26/2017  . S/P total knee arthroplasty 05/20/2017  . Osteoarthritis 09/22/2016  . Pulmonary nodules 09/25/2015  . Atherosclerosis of aortic arch ([place]) 09/25/2015  . [place] 08/17/2015  . Advanced care planning/counseling discussion 09/08/2014  . Preoperative cardiovascular examination 09/08/2014  . 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 Spinal stenosis in cervical region 11/22/2013  . Medicare annual wellness visit, subsequent 06/22/2012  . Cervical stenosis of spinal canal 07/29/2011    Class: Chronic  . SHOULDER PAIN, RIGHT 04/18/2010  . History of tobacco abuse 12/02/2006  . COPD (chronic obstructive pulmonary disease) ([place]) 12/01/2006  . DIVERTICULOSIS, COLON 12/01/2006  . Chronic back pain 12/01/2006  . PROSTATE CANCER, HX OF 12/01/2006    Psychiatric History:  No prior psychiatric history no prior neurological history related to dementia.  However, the patient has shown some significant microvascular ischemic changes which could account for some of his word finding issues although most likely there have been acute hypoxic event.  Family Med/Psych History:  Family History  Problem Relation Age of Onset  . Hypertension Mother   . Cancer Father        bone, prostate  . Diabetes Sister        x 40 years  . Cancer Brother        prostate   Impression/DX:  Carlos Olson is an 79 year old male with history of COPD, CAD, AAA status postsurgical  repair 04/17/2020, posterior cervical fusion 2015.  Patient presented on 04/17/2020 for elective AAA repair as well as right common femoral artery aneurysm repair per Dr. Donzetta Matters.  Postoperative developed hypotension.  Patient developed delirium felt to be multifactorial with cranial CT scan 04/23/2020 without evidence of acute changes.  Patient with age consistent atrophy and significant microvascular ischemic changes noted.  Admitted to the comprehensive rehabilitation program due to debility and extended hospital course and recovery time.  Upon entering the room, the patient was alert and sitting up in his bed.  Patient's wife came into the room about 20 minutes into our visit.  Patient displayed good expressive and receptive language but there was clear mental status deficits.  Patient was oriented to year but confused as to month and day and confused about basic recent historical changes in his life.  Patient displayed significant circumlocutions as well as confabulation.  Patient got some factual information about his life wrong with lack of awareness.  Patient's wife was able to limit test the patient over specific  information and it was clear that there are significant deficits with attention and concentration and awareness in the setting of an individual who is quite intelligent.  He continues to use attempts at being very articulate and knowledgeable to cover up confusion and limitations to his current mental status and acute awareness.  The patient's memory deficits with good long-term memory and impaired short-term memory and confusion are consistent with hypoxic brain injury.  While his receptive and expressive language appears to be generally intact other than circumlocutions for some mild word finding issues there does appear to be significant changes since 04/17/2020.  Executive functioning deficits, attention and concentration deficits and new learning memory deficits all noted during today's  clinical interview.   Disposition/Plan:  Patient has been showing some improvements with time but he is now more than 6 weeks post hypoxic event.  It will be important to continue to reinforce short-term memory and orientation issues as his confusion have produced times of agitation and irritation during CIR.  If the patient does become confused and irritated it will be important to continue to reorient him over time for him to increase his awareness and memory of his immediate status.  Diagnosis:    Debility, Hypoxic Encephalopathy/brain injury        Electronically Signed   _______________________ Ilean Skill, Psy.D. Clinical Neuropsychologist

## 2020-06-04 NOTE — Progress Notes (Incomplete)
Physical Therapy Session Note  Patient Details  Name: Carlos Olson MRN: [id number] Date of Birth: 02-19-1941  {CHL IP REHAB PT TIME CALCULATION:[id number]}  Short Term Goals: Week 1:  PT Short Term Goal 1 (Week 1): Patient to perfom bed mobility with min A 50% of the time PT Short Term Goal 2 (Week 1): Patient to perform sit to stand transfers with mod A PT Short Term Goal 4 (Week 1): Patient to ambulate 46' with RW and mod A  Skilled Therapeutic Interventions/Progress Updates:     PT received supine in bed.l Pt says that he feels "terrible" and just wants to go home. Not interested in participating in therapy and is "fed up" with [facility]. PT able to slowly redirect pt, as pt says that he will "just do these things when I get home." Pt complains of chronic back pain. PT provides mobility, redirection, and RN provides lidocaine patch on R lower back to address pain. Supine to sit with modA. Pt verbalizes need to urinate and attempts to stand up with HHA. Achieves standing with modA and PT educates pt on need for RW. Additional sit to stand with RW and modA. Pt ambulates to toilet, x15', with modA manual support at hips due to forward flexed posture and periodic retropulsion, though at time pt stands with CGA. Pt is unable to void in standing. Pt ambulates 15' back to bed with modA and RW. Multiple sit to stand transfers from EOB to change brief and provide posterior pericare due toi leaking dignicare catheter. Clean brief donned with totalA while pt stands with RW. Side steps toward [facility] with minA and max verbal cues. Supine to sit with minA management of bilateral lower extremities. PT educates pt on nature of inpatient rehab, as pt appears somewhat confused on current situation and schedule of therapy and nursing tasks. Verbal cues and tactile cues for body mechanics toi perform bridge technique for supine scoot to HOB. Pt left supine in bed with alarm intact and all needs within reach.  Therapy  Documentation Precautions:  Precautions Precautions: Fall Precaution Comments: trach, flexiseal Restrictions Weight Bearing Restrictions: No       Therapy/Group: Individual Therapy  Breck Coons, PT, DPT 06/04/2020, 8:52 AM

## 2020-06-04 NOTE — Progress Notes (Signed)
Speech Language Pathology Daily Session Note  Patient Details  Name: Carlos Olson MRN: [id number] Date of Birth: 05-23-1941  Today's Date: 06/04/2020 SLP Individual Time: 4580-9983 SLP Individual Time Calculation (min): 55 min  Short Term Goals: Week 1: SLP Short Term Goal 1 (Week 1): Patient will demonstrate orientation to age, place and time with Max verbal and visual cues. SLP Short Term Goal 2 (Week 1): Patient will verbalize 3 cognitive/physical deficits with Max A verbal and visual cues. SLP Short Term Goal 3 (Week 1): Patient will demonstrate sustained attention to functional tasks for 15 minutes with Mod verbal cues for redirection. SLP Short Term Goal 4 (Week 1): Patient will demonstrate functional problem solving for basic and familiar tasks with Max verbal cues. SLP Short Term Goal 5 (Week 1): Patient will consume current diet with minimal overt s/s of aspiration and Min A verbal cues for use of swallowing compensatory strategies. SLP Short Term Goal 6 (Week 1): Patient will demonstrate efficient masticaion and complete oral clearance with trials of regular textures without overt s/s of apiration over 2 sessions prior to upgrade with Min verbal cues.  Skilled Therapeutic Interventions: Skilled treatment session focused on cognitive goals. SLP facilitated session by providing Max A verbal and visual cues for sustained attention and functional problem solving during a basic money management task. Patient appeared to demonstrate difficulty and attempt to cover it with humor. However, patient just appeared more confused with intermittent language of confusion noted. Patient's wife present and educated on current cognitive functioning and goals of skilled SLP intervention. She verbalized understanding. Patient left upright in the bed with alarm on and all needs within reach. Continue with current plan of care.      Pain No/Denies Pain   Therapy/Group: Individual Therapy  Carrieanne Kleen,  Avalon Coppinger 06/04/2020, 2:58 PM

## 2020-06-04 NOTE — Consult Note (Signed)
[facility] Nurse Consult Note: Patient receiving care in [facility] [phone] Reason for Consult: leakage around flexiseal. Spoke with bedside RN Santiago Glad. She has not assessed the patient yet but will let me know if any breakdown or MASD related to the flexiseal. Otherwise the [facility] nurse can not do anything about the flexiseal leaking. [facility] nurse will not follow at this time.  Cathlean Marseilles Tamala Julian, MSN, RN, [place], Lysle Pearl, [facility] Wound Treatment Associate Pager [phone]

## 2020-06-05 ENCOUNTER — Inpatient Hospital Stay (HOSPITAL_COMMUNITY): Payer: Medicare HMO

## 2020-06-05 ENCOUNTER — Inpatient Hospital Stay (HOSPITAL_COMMUNITY): Payer: Medicare HMO | Admitting: Occupational Therapy

## 2020-06-05 ENCOUNTER — Inpatient Hospital Stay (HOSPITAL_COMMUNITY): Payer: Medicare HMO | Admitting: Speech Pathology

## 2020-06-05 LAB — URINALYSIS, ROUTINE W REFLEX MICROSCOPIC
Bilirubin Urine: NEGATIVE
Glucose, UA: NEGATIVE mg/dL
Ketones, ur: NEGATIVE mg/dL
Nitrite: POSITIVE — AB
Protein, ur: 100 mg/dL — AB
RBC / HPF: >50 RBC/hpf — ABNORMAL HIGH (ref 0–5)
Specific Gravity, Urine: 1.013 (ref 1.005–1.030)
WBC, UA: >50 WBC/hpf — ABNORMAL HIGH (ref 0–5)
pH: 6 (ref 5.0–8.0)

## 2020-06-05 NOTE — Progress Notes (Signed)
Occupational Therapy Session Note  Patient Details  Name: Carlos Olson MRN: [id number] Date of Birth: September 30, 1940  Today's Date: 06/05/2020 OT Individual Time: 6734-1937 OT Individual Time Calculation (min): 49 min    Short Term Goals: Week 1:  OT Short Term Goal 1 (Week 1): Patient will complete toilet transfer with max A of 1 OT Short Term Goal 2 (Week 1): Patient will complete 1 step of LB dressing task OT Short Term Goal 3 (Week 1): Pt will maintain sitting balance at EOB with no more than mod A within BADL task  Skilled Therapeutic Interventions/Progress Updates:    Patient in bed, alert and pleasant, mild confusion related to daily routine.  Rolling in bed for change of brief with min A.  LB dressing in bed max A.  Supine to sitting edge of bed with min A.  CG/CS for unsupported sitting.  Sit to stand from elevated bed surface and from w/c with left rail min A.  SPT bed to w/c minA.  UB dressing mod A, oral care w/c level with min A (to hold and scrub lower dentures).  Note left hand able to hold rail but with limited dexterity for [facility] tasks during adl.  He remained seated in high back w/c in semi reclined position, son in law present, telesitter and seat belt alarm set, call bell in hand.    Therapy Documentation Precautions:  Precautions Precautions: Fall Precaution Comments: trach, flexiseal Restrictions Weight Bearing Restrictions: No  Therapy/Group: Individual Therapy  Carlos Levering 06/05/2020, 7:40 AM

## 2020-06-05 NOTE — Patient Care Conference (Signed)
Inpatient RehabilitationTeam Conference and Plan of Care Update Date: 06/05/2020   Time: 10:43 AM    Patient Name: Carlos Olson      Medical Record Number: [id number]  Date of Birth: 07/05/1940 Sex: Male         Room/Bed: 4W19C/4W19C-01 Payor Info: Payor: [employer] MEDICARE / Plan: [employer] MEDICARE HMO/PPO / Product Type: *No Product type* /    Admit Date/Time:  05/30/2020  6:28 PM  Primary Diagnosis:  [facility] Problems: Principal Problem:   Debility Active Problems:   Moderate hypoxic-ischemic encephalopathy    Expected Discharge Date: Expected Discharge Date: 06/20/20  Team Members Present: Physician leading conference: Dr. Alger Simons Care Coodinator Present: Loralee Pacas, LCSWA;Neriah Brott Creig Hines, RN, BSN, [place] Nurse Present: Barnabas Lister, RN PT Present: Tereasa Coop, PT OT Present: Elisabeth Most, OT SLP Present: Weston Anna, SLP PPS Coordinator present : Gunnar Fusi, SLP     Current Status/Progress Goal Weekly Team Focus  Bowel/Bladder   pt is incont of B/B. Pt has rectal tube in at this time; both stool and urine are collecting in the bag. UA/Culture collected per order; pending resutls.  Pt will regain continence of b/b.  Q2h toileting.   Swallow/Nutrition/ Hydration   Dys. 3 gextures with thin liquids, Supervision  Mod I  tolerance of curent diet, use of strategies, trials of regular textures   ADL's   Max A oveall  Supervision/CGA  cognitive retraining, self-care retraining, activity tolerance, transfers, sit<>stands   Mobility   modA bed mobility, modA sit to stand, gait 15' with RW. Posterior lean in standing.  Supervision  Cognition, bed mobility, balance, transfers, ambulation   Communication             Safety/Cognition/ Behavioral Observations  Mod-Max A  Min A-Supervision  orientation, attention, problem solving, awareness   Pain   Pt c/o lower back pain;. Patch and tylenol effective.  Pts pain will be less than 3  Assess pain  qshift/PRN   Skin   Surgical incisons have skin glue. all clean/dry/intact. no other obvious signs of skin breakdown/infection  pt skin will be free of breakdown and infection.  Assess skin qshift/prn     Discharge Planning:  D/c to home with 24/7 care from wife, dtr and son in law.   Team Discussion: Continent B/B, with incontinent episodes. MASD to buttocks. OT reports patient is max assist with lower body dressing, mod assist with upper body dressing, min assist transfers from an elevated surface. PT reports patient makes many attempts to refuse therapy. He is a mod assist for bed mobility, mod assist for sit to stand and gait of 15' with RW. He has a posterior lean when standing. SLP reports patient is working on orientation, attention, problem solving, and awareness. He tries to cover with humor to compensate when he is having trouble with something. His goals are min assist to supervision. He is currently on a Dys 3, thin liquids. At the end of the day he has a wet decent cough. Patient on target to meet rehab goals: yes  *See Care Plan and progress notes for long and short-term goals.   Revisions to Treatment Plan:  Lurline Idol is capped. Teaching Needs: Continue with family education.  Current Barriers to Discharge: Decreased caregiver support, Medical stability, Home enviroment access/layout, Incontinence, Lack of/limited family support and Behavior  Possible Resolutions to Barriers: Continue current medications, wean from trach, remove cortrac, increase PO intake, provide nutritional supplementation, timed toileting schedule, provide emotional support to patient  and family.     Medical Summary Current Status: AAA repair complicated by VDRF, encephalopathic, trach now capped, NGT for dysphagia/nutrition. taking in PO on a limited basis  Barriers to Discharge: Medical stability;Behavior   Possible Resolutions to [employer]: daily lab/data review, wean from trach, remove  NGT, increase PO intake   Continued Need for Acute Rehabilitation Level of Care: The patient requires daily medical management by a physician with specialized training in physical medicine and rehabilitation for the following reasons: Direction of a multidisciplinary physical rehabilitation program to maximize functional independence : Yes Medical management of patient stability for increased activity during participation in an intensive rehabilitation regime.: Yes Analysis of laboratory values and/or radiology reports with any subsequent need for medication adjustment and/or medical intervention. : Yes   I attest that I was present, lead the team conference, and concur with the assessment and plan of the team.   Cristi Loron 06/05/2020, 2:05 PM

## 2020-06-05 NOTE — Progress Notes (Signed)
[place] PHYSICAL MEDICINE & REHABILITATION PROGRESS NOTE   Subjective/Complaints:  Pt a little more confused today. Oriented to self but not MC. Still with loose stools  ROS: Limited due to cognitive/behavioral    Objective:   No results found. Recent Labs    06/04/20 1336  WBC 7.6  HGB 10.2*  HCT 31.6*  PLT 250   Recent Labs    06/04/20 1336  NA 136  K 4.3  CL 109  CO2 18*  GLUCOSE 117*  BUN 23  CREATININE 1.03  CALCIUM 9.3    Intake/Output Summary (Last 24 hours) at 06/05/2020 1118 Last data filed at 06/05/2020 0700 Gross per 24 hour  Intake 600 ml  Output 1550 ml  Net -950 ml        Physical Exam: Vital Signs Blood pressure 128/74, pulse 78, temperature (!) 97.4 F (36.3 C), resp. rate 20, height 6\' 3"  (1.905 m), weight 86.6 kg, SpO2 96 %.   Constitutional: No distress . Vital signs reviewed. HEENT: EOMI, oral membranes moist Neck: supple Cardiovascular: RRR without murmur. No JVD    Respiratory/Chest: CTA Bilaterally without wheezes or rales. Normal effort    GI/Abdomen: BS +, non-tender, non-distended Ext: no clubbing, cyanosis, or edema Psych: pleasant but confused and tangential Skin: No evidence of breakdown, no evidence of rash Musculoskeletal: Full ROM, No pain with AROM or PROM in the neck, trunk, or extremities. Posture appropriate  Moto 4/6 BUE and BLE, sensation intact, tone is normal      Assessment/Plan: 1. Functional deficits which require 3+ hours per day of interdisciplinary therapy in a comprehensive inpatient rehab setting.  Physiatrist is providing close team supervision and 24 hour management of active medical problems listed below.  Physiatrist and rehab team continue to assess barriers to discharge/monitor patient progress toward functional and medical goals  Care Tool:  Bathing  Bathing activity did not occur: Safety/medical concerns Body parts bathed by patient: Left arm, Right arm, Chest, Abdomen, Face   Body  parts bathed by helper: Front perineal area, Buttocks, Right upper leg, Left upper leg, Right lower leg, Left lower leg     Bathing assist Assist Level: Maximal Assistance - Patient 24 - 49%     Upper Body Dressing/Undressing Upper body dressing   What is the patient wearing?: Pull over shirt    Upper body assist Assist Level: Moderate Assistance - Patient 50 - 74%    Lower Body Dressing/Undressing Lower body dressing      What is the patient wearing?: Pants, Incontinence brief     Lower body assist Assist for lower body dressing: Maximal Assistance - Patient 25 - 49%     Toileting Toileting Toileting Activity did not occur (Clothing management and hygiene only): N/A (no void or bm) (patient has a rectal tube in- no void)  Toileting assist Assist for toileting: Maximal Assistance - Patient 25 - 49%     Transfers Chair/bed transfer  Transfers assist  Chair/bed transfer activity did not occur: Safety/medical concerns  Chair/bed transfer assist level: Moderate Assistance - Patient 50 - 74%     Locomotion Ambulation   Ambulation assist      Assist level: Moderate Assistance - Patient 50 - 74% Assistive device: Walker-rolling Max distance: 15'   Walk 10 feet activity   Assist  Walk 10 feet activity did not occur: Safety/medical concerns  Assist level: Moderate Assistance - Patient - 50 - 74% Assistive device: Walker-rolling   Walk 50 feet activity   Assist Walk  50 feet with 2 turns activity did not occur: Safety/medical concerns         Walk 150 feet activity   Assist Walk 150 feet activity did not occur: Safety/medical concerns         Walk 10 feet on uneven surface  activity   Assist Walk 10 feet on uneven surfaces activity did not occur: Safety/medical concerns         Wheelchair     Assist Will patient use wheelchair at discharge?: Yes Type of Wheelchair: Manual    Wheelchair assist level: Minimal Assistance - Patient >  75% Max wheelchair distance: 20'    Wheelchair 50 feet with 2 turns activity    Assist        Assist Level: Maximal Assistance - Patient 25 - 49%   Wheelchair 150 feet activity     Assist      Assist Level: Total Assistance - Patient < 25%   Blood pressure 128/74, pulse 78, temperature (!) 97.4 F (36.3 C), resp. rate 20, height 6\' 3"  (1.905 m), weight 86.6 kg, SpO2 96 %.  Medical Problem List and Plan: 1.  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 He had baseline memory/cognitive deficits prior to this hospitalization. His most recent CT demonstrates diffuse atrophy. He may have been a little hypoxic at some point peri-operatively.  Daughter also told me that he had mild left arm weakness d/t neck/ACDF. Left arm was also quite swollen while in acute.  2.  Antithrombotics: -DVT/anticoagulation: Eliquis             -antiplatelet therapy: N/A 3. Pain Management: Lidoderm patch only.    -no narcs d/t delirium             Monitor with increased exertion 4. Mood: Provide emotional support             -antipsychotic agents: aded Seroquel 25 mg nightly for sleep which helped   -continue sleep chart 5. Neuropsych: This patient is not fully capable of making decisions on his own behalf. Neuropsych eval today, appears encephalopathic , he misttook his daughter for wife during OT session 12/5 Neuropsych eval today , check UA C and S, CBC, CMET today , reviewed meds 6. Skin/Wound Care: Routine skin checks 7. Fluids/Electrolytes/Nutrition:               CMP reviewed, albumin low, ongoing AKI  -added megace for appetite--eats 25-50%. Not a big eater per daughter  -continue protein supps  12/7 dc tf except for H20 flushes    8.  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   Currently #6 cuffless Shiley in place.  Follow-up per critical care medicine             12/2 downsized to #4 trach cuffless  12/3-4 cork trach during day, open at night. Doing well so far. If uneventful today will cork trach 24hr/day  12/5- cork trach 24hr/day--->will c/w with pulmonary before decannulating 9.  AKI.  Improved.  Renal ultrasound no hydronephrosis.               -added water through NGT 10.  Hypotension.  Monitor with increased mobility. 11.  Atrial fibrillation.  Lopressor 50 mg twice daily, continue Eliquis.  Cardiac rate controlled.   controlled 12.  Acute on chronic anemia.                         Hgb trending up to 9.7 --recheck next week  13.  COPD with remote history of tobacco abuse.  Continue nebulizers as directed 14.  Hyperlipidemia.  Lipitor 15.  Prostate cancer with prostatectomy 1992.  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  Follow-up urology as well as gastroenterology services.  Patient currently declined endoscopy.   17. Dysphagia: D3 diet, tolerating    LOS: 6 days A FACE TO FACE EVALUATION WAS PERFORMED  Meredith Staggers 06/05/2020, 11:18 AM

## 2020-06-05 NOTE — Progress Notes (Signed)
Physical Therapy Session Note  Patient Details  Name: Carlos Olson MRN: [id number] Date of Birth: Mar 10, 1941  Today's Date: 06/05/2020 PT Individual Time: 5277-8242 and 3536-1443 PT Individual Time Calculation (min): 42 min   Short Term Goals: Week 1:  PT Short Term Goal 1 (Week 1): Patient to perfom bed mobility with min A 50% of the time PT Short Term Goal 2 (Week 1): Patient to perform sit to stand transfers with mod A PT Short Term Goal 4 (Week 1): Patient to ambulate 34' with RW and mod A  Skilled Therapeutic Interventions/Progress Updates:     1st Session: Pt received seated in [facility] and agrees to therapy. Complains of chronic low back pain. Number not provided. PT provides rest breaks and mobility to manage pain symptoms. WC transport to gym for time management. Pt performs multiple reps of sit to stand during session with minA and cues on hand placement and body mechanics. Pt ambulates x100' with RW and minA, x2 reps, with extended seated rest break. PT cues for upright gaze to improve posture and balance, increased gait speed and increased width for wider BOS to decrease risk for falls. With fatigue pt becomes increasingly forward fflexed but no overt LOBs.  Pt performs stand step transfer back to bed with CGA/minA. Sit to supine with minA. Left supine in bed with alarm intact and all needs within reach.  2nd Session: Pt received supine in bed and agrees to therapy. Supine to sit with modA and cue sofr logrolling technique. Stand pivot transfer to [facility] with minA. WC transport to therapy gym for time management. Pt performs NMR for standing balance and activity tolerance with cognitive overlay, using BITS system. Pt performs working memory task with added component of reaching outside BOS. Pt consistently able to complete 3 item memory task with minimal cuing but requires mod cuing for >4 items. Pt also has increased difficulty with targeted reaching with L hand. PT provides cues on body  mechanics and hand placement to facilitate task. Pt takes multiple seated rest breaks throughout task. Pt then performs "object search", reaching and touching every picture of a bell in a crowded visual environment. Pt requires seated rest break during task due to fatigue and minimal cuing fromi PT to accurately complete task.   Pt performs toss and rebound activity with trampoline to challenge balance and increase endurance. Sit to stand multiple reps with min and RW. In standing, pt able to hold ball with both hands and toss at trampoline. PT provides CGA/minA at hips during activity with frequent cues for trunk and hip extension. Pt able to toss ball x20 reps facing trampoline, also tossing x20 facing to L of trampoline and tossing across body for diagonal dynamic movement and weight shift.  Pt left seated in WC with alarm intact and all needs within reach.  Therapy Documentation Precautions:  Precautions Precautions: Fall Precaution Comments: trach, flexiseal Restrictions Weight Bearing Restrictions: No    Therapy/Group: Individual Therapy  Breck Coons, PT, DPT 06/05/2020, 3:28 PM

## 2020-06-05 NOTE — Progress Notes (Signed)
Speech Language Pathology Daily Session Note  Patient Details  Name: Carlos Olson MRN: [id number] Date of Birth: Jan 22, 1941  Today's Date: 06/05/2020 SLP Individual Time: 1315-1355 SLP Individual Time Calculation (min): 40 min  Short Term Goals: Week 1: SLP Short Term Goal 1 (Week 1): Patient will demonstrate orientation to age, place and time with Max verbal and visual cues. SLP Short Term Goal 2 (Week 1): Patient will verbalize 3 cognitive/physical deficits with Max A verbal and visual cues. SLP Short Term Goal 3 (Week 1): Patient will demonstrate sustained attention to functional tasks for 15 minutes with Mod verbal cues for redirection. SLP Short Term Goal 4 (Week 1): Patient will demonstrate functional problem solving for basic and familiar tasks with Max verbal cues. SLP Short Term Goal 5 (Week 1): Patient will consume current diet with minimal overt s/s of aspiration and Min A verbal cues for use of swallowing compensatory strategies. SLP Short Term Goal 6 (Week 1): Patient will demonstrate efficient masticaion and complete oral clearance with trials of regular textures without overt s/s of apiration over 2 sessions prior to upgrade with Min verbal cues.  Skilled Therapeutic Interventions: Skilled treatment session focused on cognitive goals. SLP facilitated session by providing extra time and Min verbal cues for basic problem solving during a medication management task. Patient also required total A for recall for his current medications and their functions. Overall, patient demonstrated increased appropriate interactions and participation this session. Patient left upright in bed with alarm on and all needs within reach. Continue with current plan of care.      Pain No/Denies Pain   Therapy/Group: Individual Therapy  Makalynn Berwanger 06/05/2020, 3:11 PM

## 2020-06-05 NOTE — Progress Notes (Addendum)
Patient ID: Carlos Olson, male   DOB: 22-Dec-1940, 79 y.o.   MRN: [id number]  SW met with pt son in law in room to provide updates from team conference, and d/c date 12/22.  SW spoke with pt dtr Carlos Olson [phone]) to provide above updates. SW informed will want to begin family education latter part of next week. Confirmed dates are 12/15 all day; 12/18 and 12/19 all day, and 12/20 10am-12pm. Reports will follow-up with SW to discuss which days family will be present. SW to follow-up after team conference.   Loralee Pacas, MSW, [place] Office: [phone] Cell: [phone] Fax: [phone]

## 2020-06-05 NOTE — Progress Notes (Signed)
RT seen pt with PT.

## 2020-06-06 ENCOUNTER — Inpatient Hospital Stay (HOSPITAL_COMMUNITY): Payer: Medicare HMO | Admitting: Occupational Therapy

## 2020-06-06 ENCOUNTER — Inpatient Hospital Stay (HOSPITAL_COMMUNITY): Payer: Medicare HMO | Admitting: Speech Pathology

## 2020-06-06 ENCOUNTER — Inpatient Hospital Stay (HOSPITAL_COMMUNITY): Payer: Medicare HMO

## 2020-06-06 LAB — URINE CULTURE

## 2020-06-06 MED ORDER — ATORVASTATIN CALCIUM 10 MG PO TABS
10.0000 mg | ORAL_TABLET | Freq: Every day | ORAL | Status: DC
  Administered 2020-06-06 – 2020-06-19 (×14): 10 mg via ORAL
  Filled 2020-06-06 (×14): qty 1

## 2020-06-06 MED ORDER — CHOLESTYRAMINE 4 G PO PACK
4.0000 g | PACK | Freq: Every day | ORAL | Status: DC
  Administered 2020-06-06: 4 g via ORAL
  Filled 2020-06-06 (×2): qty 1

## 2020-06-06 MED ORDER — ACETAMINOPHEN 325 MG PO TABS
650.0000 mg | ORAL_TABLET | Freq: Four times a day (QID) | ORAL | Status: DC | PRN
  Administered 2020-06-07 – 2020-06-19 (×12): 650 mg via ORAL
  Filled 2020-06-06 (×14): qty 2

## 2020-06-06 MED ORDER — APIXABAN 5 MG PO TABS
5.0000 mg | ORAL_TABLET | Freq: Two times a day (BID) | ORAL | Status: DC
  Administered 2020-06-06 – 2020-06-20 (×28): 5 mg via ORAL
  Filled 2020-06-06 (×28): qty 1

## 2020-06-06 MED ORDER — QUETIAPINE FUMARATE 25 MG PO TABS
25.0000 mg | ORAL_TABLET | Freq: Every day | ORAL | Status: DC
  Administered 2020-06-06 – 2020-06-09 (×4): 25 mg via ORAL
  Filled 2020-06-06 (×4): qty 1

## 2020-06-06 MED ORDER — MEGESTROL ACETATE 400 MG/10ML PO SUSP
400.0000 mg | Freq: Two times a day (BID) | ORAL | Status: DC
  Administered 2020-06-06 – 2020-06-20 (×28): 400 mg via ORAL
  Filled 2020-06-06 (×28): qty 10

## 2020-06-06 MED ORDER — BOOST / RESOURCE BREEZE PO LIQD CUSTOM
1.0000 | Freq: Three times a day (TID) | ORAL | Status: DC
  Administered 2020-06-07 – 2020-06-19 (×27): 1 via ORAL

## 2020-06-06 MED ORDER — PROSOURCE PLUS PO LIQD
30.0000 mL | Freq: Two times a day (BID) | ORAL | Status: DC
  Administered 2020-06-06 – 2020-06-19 (×25): 30 mL via ORAL
  Filled 2020-06-06 (×24): qty 30

## 2020-06-06 MED ORDER — METOPROLOL TARTRATE 50 MG PO TABS
50.0000 mg | ORAL_TABLET | Freq: Two times a day (BID) | ORAL | Status: DC
  Administered 2020-06-06 – 2020-06-20 (×28): 50 mg via ORAL
  Filled 2020-06-06 (×28): qty 1

## 2020-06-06 MED ORDER — NUTRISOURCE FIBER PO PACK
1.0000 | PACK | Freq: Two times a day (BID) | ORAL | Status: DC
  Administered 2020-06-06 – 2020-06-20 (×28): 1 via ORAL
  Filled 2020-06-06 (×28): qty 1

## 2020-06-06 NOTE — Progress Notes (Signed)
[place] PHYSICAL MEDICINE & REHABILITATION PROGRESS NOTE   Subjective/Complaints:  Improved intake yesterday. Has more appetite. Eating rice krispies with SLP this morning. Tolerating capped trach without problems. No cough today  ROS: Patient denies fever, rash, sore throat, blurred vision, nausea, vomiting,   cough, shortness of breath or chest pain, joint or back pain, headache, or mood change.   Objective:   No results found. Recent Labs    06/04/20 1336  WBC 7.6  HGB 10.2*  HCT 31.6*  PLT 250   Recent Labs    06/04/20 1336  NA 136  K 4.3  CL 109  CO2 18*  GLUCOSE 117*  BUN 23  CREATININE 1.03  CALCIUM 9.3    Intake/Output Summary (Last 24 hours) at 06/06/2020 0839 Last data filed at 06/06/2020 4235 Gross per 24 hour  Intake 800 ml  Output 1500 ml  Net -700 ml        Physical Exam: Vital Signs Blood pressure 125/60, pulse 93, temperature 98 F (36.7 C), temperature source Oral, resp. rate 17, height 6\' 3"  (1.905 m), weight 84.5 kg, SpO2 93 %.   Constitutional: No distress . Vital signs reviewed. HEENT: EOMI, oral membranes moist, NGT Neck: supple Cardiovascular: RRR without murmur. No JVD    Respiratory/Chest: CTA Bilaterally without wheezes or rales. Normal effort    GI/Abdomen: BS +, non-tender, non-distended, loose stool in rectal tube Ext: no clubbing, cyanosis, or edema Psych: pleasant and cooperative, more focused today. Still confused Skin: No evidence of breakdown, no evidence of rash Musculoskeletal: Full ROM, No pain with AROM or PROM in the neck, trunk, or extremities. Posture appropriate  Moto 4/5 BLE, sensation intact, tone is normal, LUE 3-4/5     Assessment/Plan: 1. Functional deficits which require 3+ hours per day of interdisciplinary therapy in a comprehensive inpatient rehab setting.  Physiatrist is providing close team supervision and 24 hour management of active medical problems listed below.  Physiatrist and rehab team  continue to assess barriers to discharge/monitor patient progress toward functional and medical goals  Care Tool:  Bathing  Bathing activity did not occur: Safety/medical concerns Body parts bathed by patient: Left arm, Right arm, Chest, Abdomen, Face   Body parts bathed by helper: Front perineal area, Buttocks, Right upper leg, Left upper leg, Right lower leg, Left lower leg     Bathing assist Assist Level: Maximal Assistance - Patient 24 - 49%     Upper Body Dressing/Undressing Upper body dressing   What is the patient wearing?: Pull over shirt    Upper body assist Assist Level: Moderate Assistance - Patient 50 - 74%    Lower Body Dressing/Undressing Lower body dressing      What is the patient wearing?: Pants, Incontinence brief     Lower body assist Assist for lower body dressing: Maximal Assistance - Patient 25 - 49%     Toileting Toileting Toileting Activity did not occur (Clothing management and hygiene only): N/A (no void or bm) (patient has a rectal tube in- no void)  Toileting assist Assist for toileting: Maximal Assistance - Patient 25 - 49%     Transfers Chair/bed transfer  Transfers assist  Chair/bed transfer activity did not occur: Safety/medical concerns  Chair/bed transfer assist level: Minimal Assistance - Patient > 75%     Locomotion Ambulation   Ambulation assist      Assist level: Minimal Assistance - Patient > 75% Assistive device: Walker-rolling Max distance: 110'   Walk 10 feet activity  Assist  Walk 10 feet activity did not occur: Safety/medical concerns  Assist level: Minimal Assistance - Patient > 75% Assistive device: Walker-rolling   Walk 50 feet activity   Assist Walk 50 feet with 2 turns activity did not occur: Safety/medical concerns  Assist level: Minimal Assistance - Patient > 75% Assistive device: Walker-rolling    Walk 150 feet activity   Assist Walk 150 feet activity did not occur: Safety/medical  concerns         Walk 10 feet on uneven surface  activity   Assist Walk 10 feet on uneven surfaces activity did not occur: Safety/medical concerns         Wheelchair     Assist Will patient use wheelchair at discharge?: Yes Type of Wheelchair: Manual    Wheelchair assist level: Minimal Assistance - Patient > 75% Max wheelchair distance: 20'    Wheelchair 50 feet with 2 turns activity    Assist        Assist Level: Maximal Assistance - Patient 25 - 49%   Wheelchair 150 feet activity     Assist      Assist Level: Total Assistance - Patient < 25%   Blood pressure 125/60, pulse 93, temperature 98 F (36.7 C), temperature source Oral, resp. rate 17, height 6\' 3"  (1.905 m), weight 84.5 kg, SpO2 93 %.  Medical Problem List and Plan: 1.  Debility secondary to AAA repair 93/79/0240 complicated by acute respiratory failure/MSSA pneumonia             -patient may not shower             -ELOS/Goals: Supervision/Mod I 13-17 days             continue PT, OT, SLP  -spoke with daughter at length 12/7. He had baseline memory/cognitive deficits prior to this hospitalization. His most recent CT demonstrates diffuse atrophy. He may have been a little hypoxic at some point peri-operatively.  Daughter also told me that he had mild left arm weakness d/t neck/ACDF. Left arm was also quite swollen while in acute.  2.  Antithrombotics: -DVT/anticoagulation: Eliquis             -antiplatelet therapy: N/A 3. Pain Management: Lidoderm patch only.    -no narcs d/t delirium             Monitor with increased exertion 4. Mood: Provide emotional support             -antipsychotic agents: aded Seroquel 25 mg nightly for sleep which helped   -continue sleep chart 5. Neuropsych: This patient is not fully capable of making decisions on his own behalf. Neuropsych eval today, appears encephalopathic , he misttook his daughter for wife during OT session 12/5 Neuropsych eval   -see  above. Pt had baseline cognitive deficits  -12/7 UCX with multispecies 6. Skin/Wound Care: Routine skin checks 7. Fluids/Electrolytes/Nutrition:               CMP reviewed, albumin low, ongoing AKI  -added megace for appetite--eats 25-50%. Not a big eater per daughter  -continue protein supps  12/8 pt eating more, 25-75%---->remove NGT    8.  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  Currently #6 cuffless Shiley in place.  Follow-up per critical care medicine             12/2 downsized to #4 trach cuffless  12/3-4 cork trach during day, open at night. Doing well so far. If uneventful today will cork trach 24hr/day  12/8- trach  has been corked for 3 days. Awaiting pulmonary f/u before decannulating 9.  AKI.  Improved.  Renal ultrasound no hydronephrosis.               -added water through NGT 10.  Hypotension.               Monitor with increased mobility. 11.  Atrial fibrillation.  Lopressor 50 mg twice daily, continue Eliquis.  Cardiac rate controlled.   controlled 12.  Acute on chronic anemia.                         Hgb trending up to 9.7 --recheck Friday 13.  COPD with remote history of tobacco abuse.  Continue nebulizers as directed 14.  Hyperlipidemia.  Lipitor 15.  Prostate cancer with prostatectomy 1992.  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  Follow-up urology as well as gastroenterology services.  Patient currently declined endoscopy.  -rectal tube 17. Dysphagia: D3 diet, tolerating    LOS: 7 days A FACE TO FACE EVALUATION WAS PERFORMED  Meredith Staggers 06/06/2020, 8:39 AM

## 2020-06-06 NOTE — Progress Notes (Signed)
Speech Language Pathology Weekly Progress and Session Note  Patient Details  Name: Carlos Olson MRN: [id number] Date of Birth: 1941/06/26  Beginning of progress report period: May 30, 2020 End of progress report period: June 06, 2020  Today's Date: 06/06/2020 SLP Individual Time: 0725-0800 SLP Individual Time Calculation (min): 35 min  Short Term Goals: Week 1: SLP Short Term Goal 1 (Week 1): Patient will demonstrate orientation to age, place and time with Max verbal and visual cues. SLP Short Term Goal 1 - Progress (Week 1): Met SLP Short Term Goal 2 (Week 1): Patient will verbalize 3 cognitive/physical deficits with Max A verbal and visual cues. SLP Short Term Goal 2 - Progress (Week 1): Met SLP Short Term Goal 3 (Week 1): Patient will demonstrate sustained attention to functional tasks for 15 minutes with Mod verbal cues for redirection. SLP Short Term Goal 3 - Progress (Week 1): Met SLP Short Term Goal 4 (Week 1): Patient will demonstrate functional problem solving for basic and familiar tasks with Max verbal cues. SLP Short Term Goal 4 - Progress (Week 1): Met SLP Short Term Goal 5 (Week 1): Patient will consume current diet with minimal overt s/s of aspiration and Min A verbal cues for use of swallowing compensatory strategies. SLP Short Term Goal 5 - Progress (Week 1): Met SLP Short Term Goal 6 (Week 1): Patient will demonstrate efficient masticaion and complete oral clearance with trials of regular textures without overt s/s of apiration over 2 sessions prior to upgrade with Min verbal cues. SLP Short Term Goal 6 - Progress (Week 1): Not met    New Short Term Goals: Week 2: SLP Short Term Goal 1 (Week 2): Patient will demonstrate efficient masticaion and complete oral clearance with trials of regular textures without overt s/s of apiration over 2 sessions prior to upgrade with Min verbal cues. SLP Short Term Goal 2 (Week 2): Patient will consume current diet with minimal  overt s/s of aspiration and Supervision verbal cues for use of swallowing compensatory strategies. SLP Short Term Goal 3 (Week 2): Patient will demonstrate functional problem solving for basic and familiar tasks with Mod verbal cues. SLP Short Term Goal 4 (Week 2): Patient will demonstrate sustained attention to functional tasks for 15 minutes with Min verbal cues for redirection. SLP Short Term Goal 5 (Week 2): Patient will demonstrate orientation to age, place and time with Min verbal and visual cues. SLP Short Term Goal 6 (Week 2): Paient will self-monitor and correct errors during functional tasks with Mod verbal cues.  Weekly Progress Updates: Patient has made functional gains and has met 5 of 6 STGs this reporting period. Currently, patient is consuming Dys. 3 textures with thin liquids with minimal overt s/s of aspiration and Min verbal cues for use of swallowing compensatory strategies. Trials of regular textures have not been attempted due to patient's initial cognitive functioning, however, suspect patient's cognitive improvement will allow focus on trials during next reporting period. Patient demonstrates improved overall cognitive functioning with improvement in orientation, sustained attention, intellectual awareness and recall are noted. Overall, patient requires Mod-Max A verbal cues to complete functional and familiar tasks safely. Patient and family education ongoing. Patient would benefit from continued skilled SLP intervention to maximize his swallowing and cognitive functioning prior to discharge.      Intensity: Minumum of 1-2 x/day, 30 to 90 minutes Frequency: 3 to 5 out of 7 days Duration/Length of Stay: 06/20/20 Treatment/Interventions: Dysphagia/aspiration precaution training;Cognitive remediation/compensation;Internal/external aids;Therapeutic Activities;Environmental controls;Cueing hierarchy;Functional tasks;Patient/family  education   Daily Session  Skilled Therapeutic  Interventions: Skilled treatment session focused on dysphagia and cognitive goals. SLP facilitated session by providing skilled observation with breakfast meal of Dys. 3 textures with thin liquids. Patient consumed mixed consistencies with cough X 1, suspect due to talking with a full oral cavity. Patient declined the french toast due to it being too tough but no overt s/s of aspiration noted with thin liquids. Recommend patient continue current diet. SLP also facilitated session by providing supervision level verbal cues for orientation to date and Min verbal cues for intellectual awareness in regards to deficits and goals of skilled interventions. Patient left upright in bed with alarm on and all needs within reach. Continue with current plan of care.     Pain No/Denies Pain   Therapy/Group: Individual Therapy  Favian Kittleson 06/06/2020, 8:17 AM

## 2020-06-06 NOTE — Progress Notes (Signed)
   06/06/20 1755  Vitals  Temp (!) 97.5 F (36.4 C)   Per pt family request to obtain temperature. Pt is flushed and pt "feels cold". Temperature WNL. No complications noted. Sheela Stack, LPN

## 2020-06-06 NOTE — Progress Notes (Signed)
Physical Therapy Session Note  Patient Details  Name: Carlos Olson MRN: [id number] Date of Birth: Nov 24, 1940  Today's Date: 06/06/2020 PT Individual Time: 1430-1530 PT Individual Time Calculation (min): 60 min   Short Term Goals: Week 1:  PT Short Term Goal 1 (Week 1): Patient to perfom bed mobility with min A 50% of the time PT Short Term Goal 2 (Week 1): Patient to perform sit to stand transfers with mod A PT Short Term Goal 4 (Week 1): Patient to ambulate 52' with RW and mod A  Skilled Therapeutic Interventions/Progress Updates:     Pt received supine in bed and agrees to therapy. No complaint of pain. Supine to sit with minA and verbal cues on hand placement and body mechanics. PT dons shoes at EOB for time management. Pt performs stand pivot transfer to [facility] with HHA modA. WC transport to gym for energy conservation. Pt ambulates x150' with RW and minA. Pt has very narrow BOS and internally rotated hips during gait, and gait deviations worsen with fatigue, with pt often hitting feet together during swing phase. PT cues for correction and pt able to partially correct but quickly reverts to previous gait pattern. Pt performs NMR in parallel bars for increased motor activation and strengthening of hip abductors. Sit to stand in parallel bars with CGA. Pt performs sidestepping L and R, x20' total, with mirror for visual feedback and supervision from PT, providing multimodal cues on posture, body mechanics, and step positioning. Pt appears very SOB following activity and PT assesses vitals. HR 104 and Oxygen 95% on room air. Pt performs sidestepping second bout. Following extended seated rest break, pt ambulates 200' back to room with minA and RW, continuing to have almost scissoring gait pattern due to internal rotation of hips and narrow stride width. Sit to supine with minA. Pt left with alarm intact and all needs within reach.  Therapy Documentation Precautions:  Precautions Precautions:  Fall Precaution Comments: trach, flexiseal Restrictions Weight Bearing Restrictions: No    Therapy/Group: Individual Therapy  Breck Coons, PT, DPT 06/06/2020, 3:21 PM

## 2020-06-06 NOTE — Progress Notes (Signed)
   I visited patient during his rehab session today.  He is exhibiting much clearer mentation.  He has been decannulated does not have any further feeding tube.  Overall he appears to be progressing well.  I also discussed the patient's care with the daughter at bedside.  Carlos Olson

## 2020-06-06 NOTE — Progress Notes (Addendum)
Pt trach removed per RT. Pt tolerating well. No complications noted at this time.  Sheela Stack

## 2020-06-06 NOTE — Progress Notes (Signed)
Nutrition Follow-up  RD working remotely.  DOCUMENTATION CODES:   Severe malnutrition in context of acute illness/injury  INTERVENTION:   - Boost Breeze po TID, each supplement provides 250 kcal and 9 grams of protein  - Magic Cup TID with meals, each supplement provides 290 kcal and 9 grams of protein  - ProSource Plus 30 ml po BID, each supplement provides 100 kcal and 15 grams of protein  NUTRITION DIAGNOSIS:   Severe Malnutrition related to acute illness (elective AAA reapir and right common femoral artery aneurysm repair) as evidenced by moderate fat depletion, severe muscle depletion, percent weight loss (7.4% weight loss in 2.5 months).  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 Presented on 04/17/20 for elective AAA repair as well as right common femoral artery aneurysm repair. A Cortrak was placed for nutrition support as well as hydration. Pt did require intubation with ventilatory support due to respiratory distress 05/04/20 with tracheostomy performed 05/09/20. 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 Admitted to CIR on 05/30/20.  12/05 - nocturnal TF decreased by MD to stimulate appetite 12/07 - MD d/c nocturnal TF 12/08 - decannulated, Cortrak removed  Unable to reach pt via phone call to room. Cortrak removed today due to improved PO intake. Pt on megace to stimulate appetite.  Noted Ensure Enlive changed to Boost Breeze due to pt's preference. Will increase Boost Breeze from BID to TID. Will also add Magic  Cups with meals and order ProSource Plus to aid pt in meeting kcal and protein needs via POs now that Cortrak has been removed.  12/03 weight: 85.6 kg 12/08 weight: 84.5 kg  Pt weight a 1.1 kg weight loss over the last 5 days. This is a 1.3% weight loss in less than 1 week which is significant for timeframe and concerning given pt's standing malnutrition diagnosis. Recommend closely monitoring PO intake and assessing for need to replace Cortrak NG tube and restart tube feeds for nutrition support.  Meal Completion: 0-75% x last 8 meals  Medications reviewed and include: questran, Boost Breeze BID, nutrisource fiber BID, megace 400 mg BID  Labs reviewed.  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 Yes; Fluid consistency: Thin  Diet effective now                 EDUCATION NEEDS:   Education needs have been addressed  Skin:  Skin Assessment: Skin Integrity Issues: Other: MASD to buttocks  Last BM:  06/06/20 rectal tube with combined urine and stool d/t colovesical fistula  Height:   Ht Readings from Last 1 Encounters:  05/31/20 6\' 3"  (1.905 m)    Weight:   Wt Readings from Last 1 Encounters:  06/06/20 84.5 kg    BMI:  Body mass index is 23.28 kg/m.  Estimated Nutritional Needs:   Kcal:  2200-2400  Protein:  120-145 grams  Fluid:  >/=  2.2 L    Gustavus Bryant, MS, RD, LDN Inpatient Clinical Dietitian Please see [facility] for contact information.

## 2020-06-06 NOTE — Progress Notes (Signed)
Occupational Therapy Session Note  Patient Details  Name: Carlos Olson MRN: [id number] Date of Birth: 07-10-1940  Today's Date: 06/06/2020 OT Individual Time: 0258-5277 and 1140-1205 OT Individual Time Calculation (min): 60 min and 25 min   Short Term Goals: Week 1:  OT Short Term Goal 1 (Week 1): Patient will complete toilet transfer with max A of 1 OT Short Term Goal 2 (Week 1): Patient will complete 1 step of LB dressing task OT Short Term Goal 3 (Week 1): Pt will maintain sitting balance at EOB with no more than mod A within BADL task  Skilled Therapeutic Interventions/Progress Updates:     Visit 1:  C/o chronic back pain  Pt received in bed and agreeable to therapy focusing on ADL training,  Pt was able to sit to EOB and stand from elevated bed to RW with min A pushing up with B hands and then reaching for the RW.  Transferred to wc to sit at sink for self care (see documentation below).  Pt participated very well and was able to stand several times from wc with CGA and then stood at sink for 1-2 minutes at a time.    At end of session transferred to recliner.  Adjusted with pillows and belt alarm. All needs met.  Visit 2:  Pain: chronic back pain Pt in recliner c/o back discomfort as recliner was too short for him.  Initially pt being attended to by RT as his trach was removed, NG tube removed earlier. Pt continues to have a rectal tube, but agreeable to working on ambulating to toilet to assess transfers for when he is able to use it.  Pt needed mod A to stand up from low recliner, then ambulated with RW to toilet.  (had to obtain another BSC to fit over toilet)  With min A.  He did well sit to stand to toilet using grab bar for extra support with only min a.  Ambulated back to bed to get adjusted in bed in chair position to prepare for lunch.  Bed alarm set and dtr in room with pt.     Therapy Documentation Precautions:  Precautions Precautions: Fall Precaution  Comments: trach, flexiseal Restrictions Weight Bearing Restrictions: No    Vital Signs: Therapy Vitals Temp: 97.6 F (36.4 C) Temp Source: Oral Pulse Rate: 77 Resp: 17 BP: 118/60 Patient Position (if appropriate): Sitting Oxygen Therapy SpO2: 96 % O2 Device: Room Air Pain: Pain Assessment Pain Score: 8  ADL: Grooming: Setup Where Assessed-Grooming: Sitting at sink Upper Body Bathing: Supervision/safety Where Assessed-Upper Body Bathing: Sitting at sink Lower Body Bathing: Moderate assistance Where Assessed-Lower Body Bathing: Sitting at sink, Standing at sink Upper Body Dressing: Minimal assistance Where Assessed-Upper Body Dressing: Sitting at sink Lower Body Dressing: Maximal assistance Where Assessed-Lower Body Dressing: Sitting at sink, Standing at sink Toileting: Unable to assess Toilet Transfer: min A to elevated toilet Tub/Shower Transfer: Unable to assess  Therapy/Group: Individual Therapy  Yolonda Purtle 06/06/2020, 1:03 PM

## 2020-06-06 NOTE — Progress Notes (Signed)
Per order by MD, pt's trach was removed. Gauzed was applied to pt's stoma. Pt tolerated well w/SVS.

## 2020-06-06 NOTE — Progress Notes (Signed)
Pt cortrak removed at this time. No complications noted. Pt tolerated well. Carlos Olson

## 2020-06-06 NOTE — Progress Notes (Signed)
NAME:  Carlos Olson, MRN:  [id number], DOB:  05/21/1941, LOS: 7 ADMISSION DATE:  05/30/2020, CONSULTATION DATE: 10/21 REFERRING MD: Dr. Donzetta Matters, CHIEF COMPLAINT: AKI with mild hypoxia  Brief History   79 year old male who presented 10/18 for elective endovascular repair abdominal aortic aneurysm with Dr. Donzetta Matters. PCCM consulted 10/21 for assistance in medical management.  Hospital course complicated by AKI, metabolic encephalopathy, hypoxia/ RLL pneumonia, and hypotension. Treated in ICU for sepsis, AFRVR, underwent cardioversion, ABX course. Moved out of ICU. Ongoing course significant for AKI, AF, debility. Then 11/2 he became encephalopathic overnight and was transferred back to ICU. PCCM re-consulted.  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  Past Medical History  AAA History of prostate cancer Heart murmur Diverticulitis COPD CAD atherosclerosis  Significant Hospital Events   Admitted 10/18 for elective AAA repair 10/19 OR  11/15 fever while on cefazolin, changed to ceftriaxone 12/2 trach downsized to 4 cuffless 12/3 through 12/7 trach capped during the day 12/7 trach capped overnight 12/8 decannulated  Consults:  Cardiology PCCM Nephrology GI  Procedures:  Endovascular AAA repair 10/19 Tracheostomy tube placement 11/10 > 12/8  Significant Diagnostic Tests:  04/21/2020 CT A/P > Status post abdominal aortic repair with aortobifemoral bypass graft. Some stranding is noted in the operative site likely related to postoperative change. No sizable hematoma is seen. Diverticulosis without diverticulitis. Near complete consolidation of the right lower lobe. Small left pleural effusion is seen with left basilar atelectasis. 10/25 CTH > 1. Stable.  No acute intracranial abnormality. 2. Atrophy with chronic small vessel white matter ischemic  disease.  Micro Data:  Blood 10/28 > negative Sputum 10/28 > few candida Tracheal aspirate 11/12-MSSA 11/15 urine-E.Coli>> pansensitive  Antimicrobials:  10/19 cefazolin  10/22 zosyn > 10/26 Vancomycin 10/29 > 11/2  Zosyn 10/29 > 11/3 Fluconazole 10/29 > Zyvox 11/12 Ceftriaxone 11/15 >  Interim history/subjective:  Doing well.  Tolerated trach capping for past several days through the day time, then tolerated well overnight last night.  Objective   Blood pressure 125/60, pulse 93, temperature 98 F (36.7 C), temperature source Oral, resp. rate 17, height 6\' 3"  (1.905 m), weight 84.5 kg, SpO2 93 %.    FiO2 (%):  [21 %] 21 %   Intake/Output Summary (Last 24 hours) at 06/06/2020 0939 Last data filed at 06/06/2020 6415 Gross per 24 hour  Intake 800 ml  Output 1500 ml  Net -700 ml   Filed Weights   06/04/20 0451 06/05/20 0500 06/06/20 0500  Weight: 85.6 kg 86.6 kg 84.5 kg   General:  Elderly man sitting up in chair working with OT brushing and shaving HEENT: [place]/AT, eyes anicteric, oral mucosa moist. Cortrak in place. Neuro: awake, answering questions appropriately, occasionally making nonsensical comments. Moving all extremities, globally weak. Neck: trach in place, capped.  No secretions. CV: S1S2, RRR PULM:  breathing comfortably on RA, CTAB, strong voice GI: soft, NT, ND Extremities: no peripheral edema, no cyanosis Skin: no rashes   Resolved Hospital Problem list   Hypotension due to hypovolemia  RLL pneumonia Acute metabolic and toxic encephalopathy -Avoid all narcotics MSSA pneumonia  Assessment & Plan:   Acute hypercarbic respiratory failure requiring tracheostomy for prolonged MV.  S/p trach 11/10.  Has progressed well with rehab and tolerated downsizing (currently 4 cuffless) and trach capping well.  Eager for decannulation. - OK for decannulation given his excellent response to capping trials. -  Continue rehab efforts per CIR. - Routine bronchial  hygiene.  Nothing further to add.  Nothing further to add.  PCCM will sign off.  Please do not hesitate to call us back if we can be of any further assistance.   Montey Hora, [place] [facility] 06/06/2020, 9:44 AM

## 2020-06-07 ENCOUNTER — Inpatient Hospital Stay (HOSPITAL_COMMUNITY): Payer: Medicare HMO

## 2020-06-07 ENCOUNTER — Inpatient Hospital Stay (HOSPITAL_COMMUNITY): Payer: Medicare HMO | Admitting: Occupational Therapy

## 2020-06-07 MED ORDER — CHOLESTYRAMINE 4 G PO PACK
4.0000 g | PACK | Freq: Two times a day (BID) | ORAL | Status: DC
  Administered 2020-06-07 – 2020-06-20 (×26): 4 g via ORAL
  Filled 2020-06-07 (×26): qty 1

## 2020-06-07 NOTE — Progress Notes (Signed)
[facility] PHYSICAL MEDICINE & REHABILITATION PROGRESS NOTE   Subjective/Complaints: Pleased that NGT and trach are out. Ate fairly well. Stool still quite liquid today  ROS: Patient denies fever, rash, sore throat, blurred vision, nausea, vomiting,   cough, shortness of breath or chest pain, joint or back pain, headache, or mood change.     Objective:   No results found. Recent Labs    06/04/20 1336  WBC 7.6  HGB 10.2*  HCT 31.6*  PLT 250   Recent Labs    06/04/20 1336  NA 136  K 4.3  CL 109  CO2 18*  GLUCOSE 117*  BUN 23  CREATININE 1.03  CALCIUM 9.3    Intake/Output Summary (Last 24 hours) at 06/07/2020 1044 Last data filed at 06/07/2020 0930 Gross per 24 hour  Intake 440 ml  Output 1800 ml  Net -1360 ml        Physical Exam: Vital Signs Blood pressure 126/63, pulse 81, temperature 98 F (36.7 C), resp. rate 18, height 6\' 3"  (1.905 m), weight 83.5 kg, SpO2 92 %.   Constitutional: No distress . Vital signs reviewed. HEENT: EOMI, oral membranes moist Neck: supple, trach stoma already closed Cardiovascular: RRR without murmur. No JVD    Respiratory/Chest: CTA Bilaterally without wheezes or rales. Normal effort    GI/Abdomen: BS +, non-tender, non-distended. Rectal tube full of liquidy stool Ext: no clubbing, cyanosis, or edema Psych: pleasantly confused Skin: No evidence of breakdown, no evidence of rash Musculoskeletal: Full ROM, No pain with AROM or PROM in the neck, trunk, or extremities. Posture appropriate  [place]oto 4/5 BLE, sensation intact, tone is normal, LUE 3-4/5     Assessment/Plan: 1. Functional deficits which require 3+ hours per day of interdisciplinary therapy in a comprehensive inpatient rehab setting.  Physiatrist is providing close team supervision and 24 hour management of active medical problems listed below.  Physiatrist and rehab team continue to assess barriers to discharge/monitor patient progress toward functional and medical  goals  Care Tool:  Bathing  Bathing activity did not occur: Safety/medical concerns Body parts bathed by patient: Left arm,Right arm,Chest,Abdomen,Face,Front perineal area,Right upper leg,Left upper leg   Body parts bathed by helper: Right lower leg,Left lower leg,Buttocks     Bathing assist Assist Level: Moderate Assistance - Patient 50 - 74%     Upper Body Dressing/Undressing Upper body dressing   What is the patient wearing?: Pull over shirt    Upper body assist Assist Level: Minimal Assistance - Patient > 75%    Lower Body Dressing/Undressing Lower body dressing      What is the patient wearing?: Pants,Incontinence brief     Lower body assist Assist for lower body dressing: Maximal Assistance - Patient 25 - 49%     Toileting Toileting Toileting Activity did not occur (Clothing management and hygiene only): N/A (no void or bm) (patient has a rectal tube in- no void)  Toileting assist Assist for toileting: Maximal Assistance - Patient 25 - 49%     Transfers Chair/bed transfer  Transfers assist  Chair/bed transfer activity did not occur: Safety/medical concerns  Chair/bed transfer assist level: Minimal Assistance - Patient > 75%     Locomotion Ambulation   Ambulation assist      Assist level: Minimal Assistance - Patient > 75% Assistive device: Walker-rolling Max distance: 200'   Walk 10 feet activity   Assist  Walk 10 feet activity did not occur: Safety/medical concerns  Assist level: Minimal Assistance - Patient > 75% Assistive  device: Walker-rolling   Walk 50 feet activity   Assist Walk 50 feet with 2 turns activity did not occur: Safety/medical concerns  Assist level: Minimal Assistance - Patient > 75% Assistive device: Walker-rolling    Walk 150 feet activity   Assist Walk 150 feet activity did not occur: Safety/medical concerns  Assist level: Minimal Assistance - Patient > 75% Assistive device: Walker-rolling    Walk 10 feet  on uneven surface  activity   Assist Walk 10 feet on uneven surfaces activity did not occur: Safety/medical concerns         Wheelchair     Assist Will patient use wheelchair at discharge?: Yes Type of Wheelchair: Manual    Wheelchair assist level: Minimal Assistance - Patient > 75% Max wheelchair distance: 20'    Wheelchair 50 feet with 2 turns activity    Assist        Assist Level: Maximal Assistance - Patient 25 - 49%   Wheelchair 150 feet activity     Assist      Assist Level: Total Assistance - Patient < 25%   Blood pressure 126/63, pulse 81, temperature 98 F (36.7 C), resp. rate 18, height 6\' 3"  (1.905 m), weight 83.5 kg, SpO2 92 %.  Medical Problem List and Plan: 1.  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 He had baseline memory/cognitive deficits prior to this hospitalization. His most recent CT demonstrates diffuse atrophy. He may have been a little hypoxic at some point peri-operatively.  Daughter also told me that he had mild left arm weakness d/t neck/ACDF. Left arm was also quite swollen while in acute.  2.  Antithrombotics: -DVT/anticoagulation: Eliquis             -antiplatelet therapy: N/A 3. Pain Management: Lidoderm patch only.    -no narcs d/t delirium             Monitor with increased exertion 4. Mood: Provide emotional support             -antipsychotic agents: aded Seroquel 25 mg nightly for sleep which helped   -continue sleep chart 5. Neuropsych: This patient is not fully capable of making decisions on his own behalf. Neuropsych eval today, appears encephalopathic , he misttook his daughter for wife during OT session 12/5 Neuropsych eval   -see above. Pt had baseline cognitive deficits  -12/7 UCX with  multispecies 6. Skin/Wound Care: Routine skin checks 7. Fluids/Electrolytes/Nutrition:               CMP reviewed, albumin low, ongoing AKI  -added megace for appetite--eats 25-50%. Not a big eater per daughter  -continue protein supps  12/9 ate 50-75% yesterday!    8.  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  Currently #6 cuffless Shiley in place.  Follow-up per critical care medicine             12/2 downsized to #4 trach cuffless  12/3-4 cork trach during day, open at night. Doing well so far. If uneventful today will cork trach 24hr/day  12/9 trach dc'ed 12/8---stoma already closed! 9.  AKI.  Improved.  Renal ultrasound no hydronephrosis.  10.  Hypotension.               Monitor with increased mobility. 11.  Atrial fibrillation.  Lopressor 50 mg twice daily, continue Eliquis.  Cardiac rate controlled.   controlled 12/9 12.  Acute on chronic anemia.                         Hgb trending up to 9.7 --recheck Friday 13.  COPD with remote history of tobacco abuse.  Continue nebulizers as directed 14.  Hyperlipidemia.  Lipitor 15.  Prostate cancer with prostatectomy 1992.  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  Follow-up urology as well as gastroenterology services.  Patient currently declined endoscopy.  -rectal tube  -increase questran to bid 17. Dysphagia: D3 diet, tolerating    LOS: 8 days A FACE TO FACE EVALUATION WAS PERFORMED  Meredith Staggers 06/07/2020, 10:44 AM

## 2020-06-07 NOTE — Progress Notes (Signed)
Physical Therapy Weekly Progress Note  Patient Details  Name: Carlos Olson MRN: [id number] Date of Birth: Jun 26, 1941  Beginning of progress report period: May 31, 2020 End of progress report period: June 07, 2020  Today's Date: 06/07/2020 PT Individual Time: 1001-1054 PT Individual Time Calculation (min): 53 min   Patient has met 2 of 3 short term goals.  Pt is progressing well toward mobility goals, improving independence with functional transfers and ambulation and significantly increasing ambulation distance. Pt has had improvement in mentation over past several days with corresponding improvements in functional mobility. Pt ambulating up to 200' with RW and minA, and performing transfers with minA and RW. Bed mobility continuing to require at least minA and often modA with fatigue. Pt has also shown waxing and waning of levels of confusion and fatigue. Coming week to focus on improving strength, endurance, bed mobility, balance, ambulation, and cognition.   Patient continues to demonstrate the following deficits muscle weakness, decreased cardiorespiratoy endurance, decreased awareness, decreased safety awareness and decreased memory and decreased sitting balance, decreased standing balance, decreased postural control and decreased balance strategies and therefore will continue to benefit from skilled PT intervention to increase functional independence with mobility.  Patient progressing toward long term goals..  Continue plan of care.  PT Short Term Goals Week 1:  PT Short Term Goal 1 (Week 1): Patient to perfom bed mobility with min A 50% of the time PT Short Term Goal 1 - Progress (Week 1): Progressing toward goal PT Short Term Goal 2 (Week 1): Patient to perform sit to stand transfers with mod A PT Short Term Goal 2 - Progress (Week 1): Met PT Short Term Goal 4 (Week 1): Patient to ambulate 23' with RW and mod A Week 2:  PT Short Term Goal 1 (Week 2): Pt will perform bed  mobility consistently with minA. PT Short Term Goal 2 (Week 2): Pt will perform sit to stand transfer consistenly with minA. PT Short Term Goal 3 (Week 2): Pt will ambulate 200' with RW and CGA.  Skilled Therapeutic Interventions/Progress Updates:     Pt received seated in reclining WC at RN station due to reported increase in level of confusion and pt attempting to get out of bed multiple times without staff. Pt reports chronic back pain but no other complaint of pain. PT provides mobility and rest breaks to manage pain. Through conversation throughout session, pt does appear to be more confused than previous session and several times mentions being at airport earlier in day. Pt is able to be reoriented temporarily. Stand pivot transfer to Nustep with modA HHA. Pt performs Nustep activity for total of 5 minutes at workload of 3 with steps per minute >40. Pt takes multiple prolonged rest breaks during activity and is noted to be breathing heavily throughout. Performed for strength and endurance training. Pt performs sit to stand with RW and modA, attempts ambulation, but very quickly fatigues and requests to sit back down. Pt ambulates total of 4'. WC transport back to room for energy conservation. Initially pt attempts to stay in [facility] but quickly decides that he would rather return to bed due to fatigue. During stand step transfer back to bed with HHA, pt becomes anxious that legs are going to give way and requires modA at hips to guide pt safely onto EOB. Pt then performs seated scooting toward [facility] with mac verbal cues and modA, having difficulty with motor planning. Sit to supine with modA. Left supine in bed with  alarm intact and all needs within reach.   Therapy Documentation Precautions:  Precautions Precautions: Fall Precaution Comments: trach, flexiseal Restrictions Weight Bearing Restrictions: No   Therapy/Group: Individual Therapy  Breck Coons, PT, DPT 06/07/2020, 10:59 AM

## 2020-06-07 NOTE — Plan of Care (Signed)
  Problem: RH BOWEL ELIMINATION Goal: RH STG MANAGE BOWEL WITH ASSISTANCE Description: STG Manage Bowel with min Assistance. Outcome: Not Progressing; flexiseal   Problem: RH BLADDER ELIMINATION Goal: RH STG MANAGE BLADDER WITH ASSISTANCE Description: STG Manage Bladder With min Assistance Outcome: Not Progressing; incontinence   Problem: RH SAFETY Goal: RH STG ADHERE TO SAFETY PRECAUTIONS W/ASSISTANCE/DEVICE Description: STG Adhere to Safety Precautions With min Assistance/Device. Outcome: Not Progressing; telesitter   Problem: RH KNOWLEDGE DEFICIT GENERAL Goal: RH STG INCREASE KNOWLEDGE OF SELF CARE AFTER HOSPITALIZATION Description: Patient will be able to demonstrate knowledge of medication manage, dietary restrictions, safety precautions, with handouts, education materials and min assist from staff. Outcome: Not Progressing; confused

## 2020-06-07 NOTE — Progress Notes (Signed)
Pt continues to attempt to get out of the bed.Pt is a & o x2.  Pt has not slept any through out the night, No PRN ordered to aid with sleep. No signs of distress, call light in reach.

## 2020-06-07 NOTE — Progress Notes (Signed)
Occupational Therapy Weekly Progress Note  Patient Details  Name: Carlos Olson MRN: [id number] Date of Birth: 17-Jul-1940  Beginning of progress report period: May 31, 2020 End of progress report period: June 07, 2020  Today's Date: 06/07/2020 OT Individual Time: 1345-1500 OT Individual Time Calculation (min): 75 min   Patient has met 3 of 3 short term goals.  Patient is making steady progress towards OT goals. Patient has demonstrated overall improved activity tolerance and needs mod A for UB ADLs and max A for LB ADLs.  Patient continues to have confusionand needs cues for orientation, memory, and problem solving. Continue current POC.  Patient continues to demonstrate the following deficits: muscle weakness, decreased cardiorespiratoy endurance, impaired timing and sequencing, ataxia, decreased coordination and decreased motor planning, decreased motor planning, decreased initiation, decreased attention, decreased awareness, decreased problem solving, decreased safety awareness, decreased memory and delayed processing and decreased sitting balance, decreased standing balance, decreased postural control and decreased balance strategies and therefore will continue to benefit from skilled OT intervention to enhance overall performance with BADL and Reduce care partner burden.  Patient progressing toward long term goals..  Continue plan of care.  OT Short Term Goals Week 1:  OT Short Term Goal 1 (Week 1): Patient will complete toilet transfer with max A of 1 OT Short Term Goal 1 - Progress (Week 1): Met OT Short Term Goal 2 (Week 1): Patient will complete 1 step of LB dressing task OT Short Term Goal 2 - Progress (Week 1): Met OT Short Term Goal 3 (Week 1): Pt will maintain sitting balance at EOB with no more than mod A within BADL task OT Short Term Goal 3 - Progress (Week 1): Met Week 2:  OT Short Term Goal 1 (Week 2): Patient will maintain standing at the sink for 1 minute in prep  for BADL tsk OT Short Term Goal 2 (Week 2): Patient will maintain standing balance within BADL task with no more than mod A OT Short Term Goal 3 (Week 2): Pt will perform UB dressing with Mod A  Skilled Therapeutic Interventions/Progress Updates:    Patient greeted semi-reclined in bed with spouse present and agreeable to shower today. Nursing entered to cover trach site with water proof dressing. Stand-pivot into shower with mod A. Pt needed OT assist to doff clothing, then bathed with mod A overaall with verbal cues for thorougness. Pt needed min A for sitting balance with intermittent lateral LOB to the R. Dressing completed from wc at the sink with mod A for UB dressing and max A for LB dressing 2/2 fatigue s/p shower. Worked on functional use of L UE with graded peg board and clothes pin task. Pt with difficulty approximating strength to grasp small pegs, so graded task to larger pegs. Pt with also very poor shoulder strength and only able to get L UE to 80 degrees. Pt returned to room and pivoted back to bed with mod A> Pt left semi-reclined in bed with bed alarm on, call bell in reach, and needs met.   Therapy Documentation Precautions:  Precautions Precautions: Fall Precaution Comments: trach, flexiseal Restrictions Weight Bearing Restrictions: No  Pain:   denies pain  Therapy/Group: Individual Therapy  Valma Cava 06/07/2020, 10:01 AM

## 2020-06-07 NOTE — Progress Notes (Signed)
Speech Language Pathology Daily Session Note  Patient Details  Name: Carlos Olson MRN: [id number] Date of Birth: Dec 24, 1940  Today's Date: 06/07/2020 SLP Individual Time: 0830-0930 SLP Individual Time Calculation (min): 60 min  Short Term Goals: Week 2: SLP Short Term Goal 1 (Week 2): Patient will demonstrate efficient masticaion and complete oral clearance with trials of regular textures without overt s/s of apiration over 2 sessions prior to upgrade with Min verbal cues. SLP Short Term Goal 2 (Week 2): Patient will consume current diet with minimal overt s/s of aspiration and Supervision verbal cues for use of swallowing compensatory strategies. SLP Short Term Goal 3 (Week 2): Patient will demonstrate functional problem solving for basic and familiar tasks with Mod verbal cues. SLP Short Term Goal 4 (Week 2): Patient will demonstrate sustained attention to functional tasks for 15 minutes with Min verbal cues for redirection. SLP Short Term Goal 5 (Week 2): Patient will demonstrate orientation to age, place and time with Min verbal and visual cues. SLP Short Term Goal 6 (Week 2): Paient will self-monitor and correct errors during functional tasks with Mod verbal cues.  Skilled Therapeutic Interventions: Skilled treatment session focused on cognitive goals, RN reports pt was up all night resulting in increased confusion this AM. SLP facilitated session by providing mod A verbal and visual cues to increase temporal orientation (pt states he has been in the hospital since July, states his son in law is outside of his window and reoriented to 4th floor of IR at [employer]). Pt continues to benefit from verbal and visualcues to recall timeline of recent medical history, however can communicate generally about hospitalization course. Pt attempting medication pill box task for current med list, however pt requiring max A to understand directions and attempt task with pt eventually requesting to stop  d/t difficulty. Simple problem solving for room environment requiring mod A, pt unable to recall use of call button in various scenarios (stating he and his wife use "cowbells and whistles at home"). Pt demonstrating increased fatigue and confusion as session progressed. Pt left in care of RN and NT to transfer to chair and be brought to nurses desk for safety due to patient frequent attempts to get out of bed. Cont ST POC.   Pain Pain Assessment Pain Scale: 0-10 Pain Score: 0-No pain  Therapy/Group: Individual Therapy  Dewaine Conger 06/07/2020, 10:36 AM

## 2020-06-08 ENCOUNTER — Inpatient Hospital Stay (HOSPITAL_COMMUNITY): Payer: Medicare HMO | Admitting: Occupational Therapy

## 2020-06-08 ENCOUNTER — Inpatient Hospital Stay (HOSPITAL_COMMUNITY): Payer: Medicare HMO

## 2020-06-08 ENCOUNTER — Inpatient Hospital Stay (HOSPITAL_COMMUNITY): Payer: Medicare HMO | Admitting: Speech Pathology

## 2020-06-08 LAB — PREALBUMIN: Prealbumin: 19.1 mg/dL (ref 18–38)

## 2020-06-08 LAB — CBC
HCT: 33.7 % — ABNORMAL LOW (ref 39.0–52.0)
Hemoglobin: 10.3 g/dL — ABNORMAL LOW (ref 13.0–17.0)
MCH: 29.7 pg (ref 26.0–34.0)
MCHC: 30.6 g/dL (ref 30.0–36.0)
MCV: 97.1 fL (ref 80.0–100.0)
Platelets: 272 10*3/uL (ref 150–400)
RBC: 3.47 MIL/uL — ABNORMAL LOW (ref 4.22–5.81)
RDW: 15.7 % — ABNORMAL HIGH (ref 11.5–15.5)
WBC: 6.6 10*3/uL (ref 4.0–10.5)
nRBC: 0 % (ref 0.0–0.2)

## 2020-06-08 LAB — BASIC METABOLIC PANEL
Anion gap: 11 (ref 5–15)
BUN: 22 mg/dL (ref 8–23)
CO2: 20 mmol/L — ABNORMAL LOW (ref 22–32)
Calcium: 9.3 mg/dL (ref 8.9–10.3)
Chloride: 107 mmol/L (ref 98–111)
Creatinine, Ser: 1.02 mg/dL (ref 0.61–1.24)
GFR, Estimated: >60 mL/min (ref >60)
Glucose, Bld: 99 mg/dL (ref 70–99)
Potassium: 3.8 mmol/L (ref 3.5–5.1)
Sodium: 138 mmol/L (ref 135–145)

## 2020-06-08 MED ORDER — ALUM & MAG HYDROXIDE-SIMETH 200-200-20 MG/5ML PO SUSP: 15.0000 mL | ORAL | Status: DC | PRN

## 2020-06-08 NOTE — Progress Notes (Addendum)
Pt cough continues, productive tan sputum visualized. Expiratory/inspiratory wheezes auscultated. Resp 23. Neb tx given as ordered. PA Dan notified.  Sheela Stack, LPN

## 2020-06-08 NOTE — Progress Notes (Signed)
Physical Therapy Session Note  Patient Details  Name: Carlos Olson MRN: [id number] Date of Birth: Apr 27, 1941  Today's Date: 06/08/2020 PT Individual Time: 1030-1130 PT Individual Time Calculation (min): 60 min   Short Term Goals: Week 1:  PT Short Term Goal 1 (Week 1): Patient to perfom bed mobility with min A 50% of the time PT Short Term Goal 1 - Progress (Week 1): Progressing toward goal PT Short Term Goal 2 (Week 1): Patient to perform sit to stand transfers with mod A PT Short Term Goal 2 - Progress (Week 1): Met PT Short Term Goal 4 (Week 1): Patient to ambulate 65' with RW and mod A Week 2:  PT Short Term Goal 1 (Week 2): Pt will perform bed mobility consistently with minA. PT Short Term Goal 2 (Week 2): Pt will perform sit to stand transfer consistenly with minA. PT Short Term Goal 3 (Week 2): Pt will ambulate 200' with RW and CGA.  Skilled Therapeutic Interventions/Progress Updates:    PAIN pt c/o anal pain, treatment to tolerance, repositioning as needed.  Pt initially sleeping, breathing treatment completed per nurse.  Pt easily awakened and agreeable to treatment.  Son in Sports coach at bedside. Supine to sit w/min to mod assist, additional time. Sit to stand w/min to mod assist and cues for sequencing. Turn/sit to wc w/min assist and cues for safety. Pt transported to gym for continued session. Sit to stand from wc w/min to mod assist.  Gait 150, 137f w/RW, min assist, narrow base of support/mild toe in, does not correct base w/cues. 02 sats 96-98% w/activity Functional strengthening: Repeated Sit to stand from mat x 8 w/single hand on walker, single hand pushing from mat Sit to stand sidestep 2steps stand to sit repeated sequence 4times, repeated 3 sets w/seated rest between sets. stand pivot transfer mat to wc w/RW and min assist w/improved Sit to stand mechanics. Pt transported to room. stand pivot transfer wc to bed w/min assist, cues, no AD Sit to supine w/min assist,  rectal pain w/transition. Pt left supine w/rails up x 3, alarm set, bed in lowest position, and needs in reach.    Therapy Documentation Precautions:  Precautions Precautions: Fall Precaution Comments: trach, flexiseal Restrictions Weight Bearing Restrictions: No    Therapy/Group: Individual Therapy  BCallie Fielding PAlsey12/03/2020, 12:54 PM

## 2020-06-08 NOTE — Progress Notes (Signed)
Occupational Therapy Session Note  Patient Details  Name: Carlos Olson MRN: [id number] Date of Birth: October 21, 1940  Today's Date: 06/08/2020 OT Individual Time: 2919-1660 OT Individual Time Calculation (min): 45 min   Short Term Goals: Week 2:  OT Short Term Goal 1 (Week 2): Patient will maintain standing at the sink for 1 minute in prep for BADL tsk OT Short Term Goal 2 (Week 2): Patient will maintain standing balance within BADL task with no more than mod A OT Short Term Goal 3 (Week 2): Pt will perform UB dressing with Mod A  Skilled Therapeutic Interventions/Progress Updates:    Patient greeted semi-reclined in bed and agreeable to OT treatment session. OT noted patient had been incontinent of bladder in brief. Rolling with min A L and R for total A peri-care and brief change. Pt then came to sitting from flat bed with min A to elevate trunk. Pt completed LB dressing seated EOB with OT assist to thread R pant leg 2.2 rectal tube. Pt then attempted to thread LLE, but pt was unable to lean forward and lift LLE to get into pant leg. UB dressing at EOB with min A and increased time. Pt completed sit<>stand from EOB with RW wand min A, then ambulated to wc 5 feet w/ min A. Grooming tasks at the sink with focus on L fine moto r control to open and close containers. Continued worked on L fine motor control with graded peg board task using large pegs. Pt returned to room and left seated in wc with alarm belt on, call bell in reach, and needs met.   Therapy Documentation Precautions:  Precautions Precautions: Fall Precaution Comments: trach, flexiseal Restrictions Weight Bearing Restrictions: No Pain:  denies pain   Therapy/Group: Individual Therapy  Valma Cava 06/08/2020, 9:19 AM

## 2020-06-08 NOTE — Progress Notes (Signed)
[facility] PHYSICAL MEDICINE & REHABILITATION PROGRESS NOTE   Subjective/Complaints: Pt states he slept ok. Has intermittent productive cough. Denies being SOB. Stool still quite loose, has rectal tube  ROS: Patient denies fever, rash, sore throat, blurred vision, nausea, vomiting, diarrhea, shortness of breath or chest pain, joint or back pain, headache, or mood change.      Objective:   No results found. Recent Labs    06/08/20 0421  WBC 6.6  HGB 10.3*  HCT 33.7*  PLT 272   Recent Labs    06/08/20 0421  NA 138  K 3.8  CL 107  CO2 20*  GLUCOSE 99  BUN 22  CREATININE 1.02  CALCIUM 9.3    Intake/Output Summary (Last 24 hours) at 06/08/2020 1220 Last data filed at 06/08/2020 1214 Gross per 24 hour  Intake 305 ml  Output 500 ml  Net -195 ml        Physical Exam: Vital Signs Blood pressure 131/75, pulse 74, temperature 97.9 F (36.6 C), temperature source Oral, resp. rate (!) 23, height 6\' 3"  (1.905 m), weight 84.3 kg, SpO2 94 %.   Constitutional: No distress . Vital signs reviewed. HEENT: EOMI, oral membranes moist Neck: supple, stoma closed Cardiovascular: RRR without murmur. No JVD    Respiratory/Chest: occasional upper airway sounds and mild productive cough Normal effort    GI/Abdomen: BS +, non-tender, non-distended Ext: no clubbing, cyanosis, or edema Psych: pleasant and cooperative Skin: No evidence of breakdown, no evidence of rash Musculoskeletal: Full ROM, No pain with AROM or PROM in the neck, trunk, or extremities. Posture appropriate  Moto 4/5 BLE, sensation intact, tone is normal, LUE 3-4/5     Assessment/Plan: 1. Functional deficits which require 3+ hours per day of interdisciplinary therapy in a comprehensive inpatient rehab setting.  Physiatrist is providing close team supervision and 24 hour management of active medical problems listed below.  Physiatrist and rehab team continue to assess barriers to discharge/monitor patient  progress toward functional and medical goals  Care Tool:  Bathing  Bathing activity did not occur: Safety/medical concerns Body parts bathed by patient: Left arm,Right arm,Chest,Abdomen,Face,Front perineal area,Right upper leg,Left upper leg   Body parts bathed by helper: Right lower leg,Left lower leg,Buttocks     Bathing assist Assist Level: Moderate Assistance - Patient 50 - 74%     Upper Body Dressing/Undressing Upper body dressing   What is the patient wearing?: Pull over shirt    Upper body assist Assist Level: Minimal Assistance - Patient > 75%    Lower Body Dressing/Undressing Lower body dressing      What is the patient wearing?: Pants,Incontinence brief     Lower body assist Assist for lower body dressing: Maximal Assistance - Patient 25 - 49%     Toileting Toileting Toileting Activity did not occur (Clothing management and hygiene only): N/A (no void or bm) (patient has a rectal tube in- no void)  Toileting assist Assist for toileting: Maximal Assistance - Patient 25 - 49%     Transfers Chair/bed transfer  Transfers assist  Chair/bed transfer activity did not occur: Safety/medical concerns  Chair/bed transfer assist level: Moderate Assistance - Patient 50 - 74%     Locomotion Ambulation   Ambulation assist      Assist level: Minimal Assistance - Patient > 75% Assistive device: Walker-rolling Max distance: 4'   Walk 10 feet activity   Assist  Walk 10 feet activity did not occur: Safety/medical concerns  Assist level: Minimal Assistance - Patient >  75% Assistive device: Walker-rolling   Walk 50 feet activity   Assist Walk 50 feet with 2 turns activity did not occur: Safety/medical concerns  Assist level: Minimal Assistance - Patient > 75% Assistive device: Walker-rolling    Walk 150 feet activity   Assist Walk 150 feet activity did not occur: Safety/medical concerns  Assist level: Minimal Assistance - Patient > 75% Assistive  device: Walker-rolling    Walk 10 feet on uneven surface  activity   Assist Walk 10 feet on uneven surfaces activity did not occur: Safety/medical concerns         Wheelchair     Assist Will patient use wheelchair at discharge?: Yes Type of Wheelchair: Manual    Wheelchair assist level: Minimal Assistance - Patient > 75% Max wheelchair distance: 20'    Wheelchair 50 feet with 2 turns activity    Assist        Assist Level: Maximal Assistance - Patient 25 - 49%   Wheelchair 150 feet activity     Assist      Assist Level: Total Assistance - Patient < 25%   Blood pressure 131/75, pulse 74, temperature 97.9 F (36.6 C), temperature source Oral, resp. rate (!) 23, height 6\' 3"  (1.905 m), weight 84.3 kg, SpO2 94 %.  Medical Problem List and Plan: 1.  Debility secondary to AAA repair 42/70/6237 complicated by acute respiratory failure/MSSA pneumonia             -patient may not shower             -ELOS/Goals: Supervision/Mod I 12/22             continue PT, OT, SLP  -spoke with daughter at length 12/7. He had baseline memory/cognitive deficits prior to this hospitalization. His most recent CT demonstrates diffuse atrophy. He may have been a little hypoxic at some point peri-operatively.  Daughter also told me that he had mild left arm weakness d/t neck/ACDF. Left arm was also quite swollen while in acute.  2.  Antithrombotics: -DVT/anticoagulation: Eliquis             -antiplatelet therapy: N/A 3. Pain Management: Lidoderm patch only.    -no narcs d/t delirium             Monitor with increased exertion 4. Mood: Provide emotional support             -antipsychotic agents: aded Seroquel 25 mg nightly for sleep which helped   -continue sleep chart 5. Neuropsych: This patient is not fully capable of making decisions on his own behalf. Neuropsych eval today, appears encephalopathic , he misttook his daughter for wife during OT session 12/5 Neuropsych eval    -see above. Pt had baseline cognitive deficits  -12/7 UCX with multispecies 6. Skin/Wound Care: Routine skin checks 7. Fluids/Electrolytes/Nutrition:               CMP reviewed, albumin low, ongoing AKI  -added megace for appetite--eats 25-50%. Not a big eater per daughter  -continue protein supps  12/9-10 generally eating 60-100% of meals now!    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  AKI.  Improved.  Renal ultrasound no hydronephrosis.                10.  Hypotension.  Monitor with increased mobility. 11.  Atrial fibrillation.  Lopressor 50 mg twice daily, continue Eliquis.  Cardiac rate controlled.   controlled 12/9 12.  Acute on chronic anemia.                         Hgb trending up to 9.7 --recheck Friday 13.  COPD with remote history of tobacco abuse.  Continue nebulizers as directed 14.  Hyperlipidemia.  Lipitor 15.  Prostate cancer with prostatectomy 1992.  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  Follow-up urology as well as gastroenterology services.  Patient currently declined endoscopy.  -rectal tube  -increase questran to bid  12/10-need to figure out what the plan at discharge is going to be. Is he going home with a rectal tube?? 17. Dysphagia: D3 diet, tolerating    LOS: 9 days A FACE TO FACE EVALUATION WAS PERFORMED  Meredith Staggers 06/08/2020, 12:20 PM

## 2020-06-08 NOTE — Progress Notes (Signed)
Speech Language Pathology Daily Session Note  Patient Details  Name: Carlos Olson MRN: [id number] Date of Birth: 28-Jun-1941  Today's Date: 06/08/2020 SLP Individual Time: 0730-0810 SLP Individual Time Calculation (min): 40 min  Short Term Goals: Week 2: SLP Short Term Goal 1 (Week 2): Patient will demonstrate efficient masticaion and complete oral clearance with trials of regular textures without overt s/s of apiration over 2 sessions prior to upgrade with Min verbal cues. SLP Short Term Goal 2 (Week 2): Patient will consume current diet with minimal overt s/s of aspiration and Supervision verbal cues for use of swallowing compensatory strategies. SLP Short Term Goal 3 (Week 2): Patient will demonstrate functional problem solving for basic and familiar tasks with Mod verbal cues. SLP Short Term Goal 4 (Week 2): Patient will demonstrate sustained attention to functional tasks for 15 minutes with Min verbal cues for redirection. SLP Short Term Goal 5 (Week 2): Patient will demonstrate orientation to age, place and time with Min verbal and visual cues. SLP Short Term Goal 6 (Week 2): Paient will self-monitor and correct errors during functional tasks with Mod verbal cues.  Skilled Therapeutic Interventions: Skilled treatment session focused on dysphagia and cognitive goals. SLP facilitated session by providing skilled observation with breakfast of Dys. 3 textures and thin liquids. Patient consumed meal without overt s/s of aspiration.  Recommend patient continue current diet. SLP also facilitated session by providing skilled observation with trials of regular textures. Patient demonstrated efficient mastication with complete oral clearance without overt s/s of aspiration. Recommend trial tray prior to upgrade. Patient utilized the calendar for orientation to date with supervision verbal cues and was orientation place and situation with Mod I. Patient demonstrated sustained attention to tasks with  overall supervision verbal cues for redirection. Patient left upright in bed with alarm on and all needs within reach. Continue with current plan of care.      Pain Pain Assessment Pain Scale: 0-10 Pain Score: 0-No pain  Therapy/Group: Individual Therapy  Elinor Kleine 06/08/2020, 12:53 PM

## 2020-06-08 NOTE — Progress Notes (Signed)
Physical Therapy Session Note  Patient Details  Name: Carlos Olson MRN: [id number] Date of Birth: 1940-12-02  Today's Date: 06/08/2020 PT Individual Time: 1431-1530 PT Individual Time Calculation (min): 59 min   Short Term Goals: Week 2:  PT Short Term Goal 1 (Week 2): Pt will perform bed mobility consistently with minA. PT Short Term Goal 2 (Week 2): Pt will perform sit to stand transfer consistenly with minA. PT Short Term Goal 3 (Week 2): Pt will ambulate 200' with RW and CGA.  Skilled Therapeutic Interventions/Progress Updates:     Pt received supine in bed and agrees to therapy. No complaint of pain. Supine to sit with HOB slightly elevate, using minA and PT providing verbal cues on logrolling technique and body mechanics to complete. Sit to stand with CGA and stand pivot transfer to [facility] with minA at hips for stability. WC transport to therapy gym for time management.  Pt ambulates x2 bouts during session, x100' and x150', utilizing RW and minA from PT. PT provides verbal and tactile cuing for upright posture to improve balance, increasing stride width for increased stability, and pursed lip breathing to optimize oxygen sats. Pt requires extended rest breaks following each bout due to fatigue.  Pt performs NMR for hip abductor activation and activity tolerance. PT places level 1 theraband around distal thighs and pt performs 3x10 minisquats. PT provides verbal and tactile cues to increase hip abductor activation and external rotation. Extended seated rest break between each set.  Pt performs sit to supine with minA. Left supine in bed with alarm intact and all needs within reach.   Therapy Documentation Precautions:  Precautions Precautions: Fall Precaution Comments: trach, flexiseal Restrictions Weight Bearing Restrictions: No    Therapy/Group: Individual Therapy  Breck Coons, PT, DPT 06/08/2020, 3:41 PM

## 2020-06-09 NOTE — Progress Notes (Signed)
[place] PHYSICAL MEDICINE & REHABILITATION PROGRESS NOTE   Subjective/Complaints: Ate well this morning and last night. Daughter would like for him to be able to get rectal tube out- will remove today.  He was very happy to have shower Thursday No therapy today, daughter plans to wheel him in hallway.   ROS: Patient denies fever, rash, sore throat, blurred vision, nausea, vomiting, diarrhea, shortness of breath or chest pain, joint or back pain, headache, or mood change.      Objective:   No results found. Recent Labs    06/08/20 0421  WBC 6.6  HGB 10.3*  HCT 33.7*  PLT 272   Recent Labs    06/08/20 0421  NA 138  K 3.8  CL 107  CO2 20*  GLUCOSE 99  BUN 22  CREATININE 1.02  CALCIUM 9.3    Intake/Output Summary (Last 24 hours) at 06/09/2020 1536 Last data filed at 06/09/2020 1300 Gross per 24 hour  Intake 757 ml  Output 700 ml  Net 57 ml        Physical Exam: Vital Signs Blood pressure 116/66, pulse 68, temperature 98 F (36.7 C), resp. rate 20, height 6\' 3"  (1.905 m), weight 89.4 kg, SpO2 98 %. Gen: no distress, normal appearing HEENT: oral mucosa pink and moist, NCAT Cardio: Reg rate Chest: normal effort, normal rate of breathing Abd: soft, non-distended Ext: no edema Psych: pleasant and cooperative Skin: No evidence of breakdown, no evidence of rash Musculoskeletal: Full ROM, No pain with AROM or PROM in the neck, trunk, or extremities. Posture appropriate  Moto 4/5 BLE, sensation intact, tone is normal, LUE 3-4/5     Assessment/Plan: 1. Functional deficits which require 3+ hours per day of interdisciplinary therapy in a comprehensive inpatient rehab setting.  Physiatrist is providing close team supervision and 24 hour management of active medical problems listed below.  Physiatrist and rehab team continue to assess barriers to discharge/monitor patient progress toward functional and medical goals  Care Tool:  Bathing  Bathing activity did  not occur: Safety/medical concerns Body parts bathed by patient: Left arm,Right arm,Chest,Abdomen,Face,Front perineal area,Right upper leg,Left upper leg   Body parts bathed by helper: Right lower leg,Left lower leg,Buttocks     Bathing assist Assist Level: Moderate Assistance - Patient 50 - 74%     Upper Body Dressing/Undressing Upper body dressing   What is the patient wearing?: Pull over shirt    Upper body assist Assist Level: Minimal Assistance - Patient > 75%    Lower Body Dressing/Undressing Lower body dressing      What is the patient wearing?: Pants,Incontinence brief     Lower body assist Assist for lower body dressing: Maximal Assistance - Patient 25 - 49%     Toileting Toileting Toileting Activity did not occur (Clothing management and hygiene only): N/A (no void or bm) (patient has a rectal tube in- no void)  Toileting assist Assist for toileting: Maximal Assistance - Patient 25 - 49%     Transfers Chair/bed transfer  Transfers assist  Chair/bed transfer activity did not occur: Safety/medical concerns  Chair/bed transfer assist level: Minimal Assistance - Patient > 75%     Locomotion Ambulation   Ambulation assist      Assist level: Minimal Assistance - Patient > 75% Assistive device: Walker-rolling Max distance: 150'   Walk 10 feet activity   Assist  Walk 10 feet activity did not occur: Safety/medical concerns  Assist level: Minimal Assistance - Patient > 75% Assistive device: Walker-rolling  Walk 50 feet activity   Assist Walk 50 feet with 2 turns activity did not occur: Safety/medical concerns  Assist level: Minimal Assistance - Patient > 75% Assistive device: Walker-rolling    Walk 150 feet activity   Assist Walk 150 feet activity did not occur: Safety/medical concerns  Assist level: Minimal Assistance - Patient > 75% Assistive device: Walker-rolling    Walk 10 feet on uneven surface  activity   Assist Walk 10 feet  on uneven surfaces activity did not occur: Safety/medical concerns         Wheelchair     Assist Will patient use wheelchair at discharge?: Yes Type of Wheelchair: Manual    Wheelchair assist level: Minimal Assistance - Patient > 75% Max wheelchair distance: 20'    Wheelchair 50 feet with 2 turns activity    Assist        Assist Level: Maximal Assistance - Patient 25 - 49%   Wheelchair 150 feet activity     Assist      Assist Level: Total Assistance - Patient < 25%   Blood pressure 116/66, pulse 68, temperature 98 F (36.7 C), resp. rate 20, height 6\' 3"  (1.905 m), weight 89.4 kg, SpO2 98 %.  Medical Problem List and Plan: 1.  Debility secondary to AAA repair 72/53/6644 complicated by acute respiratory failure/MSSA pneumonia             -patient may not shower             -ELOS/Goals: Supervision/Mod I 12/22             Continue PT, OT, SLP  -spoke with daughter at length 12/7. He had baseline memory/cognitive deficits prior to this hospitalization. His most recent CT demonstrates diffuse atrophy. He may have been a little hypoxic at some point peri-operatively.  Daughter also told me that he had mild left arm weakness d/t neck/ACDF. Left arm was also quite swollen while in acute.  2.  Antithrombotics: -DVT/anticoagulation: Eliquis             -antiplatelet therapy: N/A 3. Pain Management: Lidoderm patch only.    -no narcs d/t delirium             Monitor with increased exertion 4. Mood: Provide emotional support             -antipsychotic agents: aded Seroquel 25 mg nightly for sleep which helped   -continue sleep chart 5. Neuropsych: This patient is not fully capable of making decisions on his own behalf. Neuropsych eval today, appears encephalopathic , he misttook his daughter for wife during OT session 12/5 Neuropsych eval   -see above. Pt had baseline cognitive deficits  -12/7 UCX with multispecies 6. Skin/Wound Care: Routine skin checks 7.  Fluids/Electrolytes/Nutrition:               CMP reviewed, albumin low, ongoing AKI  -added megace for appetite--eats 25-50%. Not a big eater per daughter  -continue protein supps  12/9-11 continues to be eating 60-100% of meals now!    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  AKI.  Improved.  Renal ultrasound no hydronephrosis.   10.  Hypotension.               Monitor with increased mobility. 11.  Atrial fibrillation.  Lopressor 50 mg twice daily, continue Eliquis.  Cardiac rate  controlled.   controlled 12/9 12.  Acute on chronic anemia.                         Hgb trending up to 9.7 --10.2 on 12/11, will not order repeat given upward trend. 13.  COPD with remote history of tobacco abuse.  Continue nebulizers as directed 14.  Hyperlipidemia.  Lipitor 15.  Prostate cancer with prostatectomy 1992.  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  Follow-up urology as well as gastroenterology services.  Patient currently declined endoscopy.  -rectal tube  -increase questran to bid  12/11:daughter concerned about discomfort of rectal tube and would like to try having it removed. Discussed removing tube today and patient and daughter are agreeable. Discussed risks of skin breakdown and importance of regular cleaning.  17. Dysphagia: D3 diet, tolerating    LOS: 10 days A FACE TO FACE EVALUATION WAS PERFORMED  Clide Deutscher Francesco Provencal 06/09/2020, 3:36 PM

## 2020-06-09 NOTE — Plan of Care (Signed)
  Problem: RH BLADDER ELIMINATION Goal: RH STG MANAGE BLADDER WITH ASSISTANCE Description: STG Manage Bladder With min Assistance Outcome: Not Progressing; INCONTINENCE   Problem: RH KNOWLEDGE DEFICIT GENERAL Goal: RH STG INCREASE KNOWLEDGE OF SELF CARE AFTER HOSPITALIZATION Description: Patient will be able to demonstrate knowledge of medication manage, dietary restrictions, safety precautions, with handouts, education materials and min assist from staff. Outcome: Not Progressing; CONFUSED AT TIMES Problem: RH BOWEL ELIMINATION Goal: RH STG MANAGE BOWEL WITH ASSISTANCE Description: STG Manage Bowel with min Assistance. Outcome: Progressing; RECTAL TUBE DISCONTINUED

## 2020-06-10 ENCOUNTER — Inpatient Hospital Stay (HOSPITAL_COMMUNITY): Payer: Medicare HMO | Admitting: Physical Therapy

## 2020-06-10 MED ORDER — QUETIAPINE FUMARATE 50 MG PO TABS
50.0000 mg | ORAL_TABLET | Freq: Every day | ORAL | Status: DC
  Administered 2020-06-10 – 2020-06-19 (×10): 50 mg via ORAL
  Filled 2020-06-10 (×10): qty 1

## 2020-06-10 NOTE — Progress Notes (Signed)
Physical Therapy Session Note  Patient Details  Name: Carlos Olson MRN: [id number] Date of Birth: 10/23/1940  Today's Date: 06/10/2020 PT Individual Time: 0848-1000 PT Individual Time Calculation (min): 72 min   Short Term Goals: Week 1:  PT Short Term Goal 1 (Week 1): Patient to perfom bed mobility with min A 50% of the time PT Short Term Goal 1 - Progress (Week 1): Progressing toward goal PT Short Term Goal 2 (Week 1): Patient to perform sit to stand transfers with mod A PT Short Term Goal 2 - Progress (Week 1): Met PT Short Term Goal 4 (Week 1): Patient to ambulate 15' with RW and mod A  Skilled Therapeutic Interventions/Progress Updates:  Pt was seen bedside in the am with daughter at bedside. Pt transferred supine to edge of bed with side rail and min A with verbal cues. Pt transferred sit to stand with min A and rolling walker, transitioned to w/c with min A and verbal cues. Pt performed multiple sit to stand and stand pivot transfers with rolling walker and c/g to min A with verbal cues. Pt performed step taps and alternating step taps, 3 sets x 10 reps each. Pt ambulated 60 feet x 2 with rolling walker and min A. Pt returned to room and transferred back to bed with rolling walker and min A. Pt left sitting up in bed with all needs within reach.   Therapy Documentation Precautions:  Precautions Precautions: Fall Precaution Comments: trach, flexiseal Restrictions Weight Bearing Restrictions: No General:   Pain: Pt c/o 2/10 low back pain.   Therapy/Group: Individual Therapy  ,  G 06/10/2020, 12:13 PM  

## 2020-06-10 NOTE — Plan of Care (Signed)
°  Problem: RH BOWEL ELIMINATION Goal: RH STG MANAGE BOWEL WITH ASSISTANCE Description: STG Manage Bowel with min Assistance. Outcome: Not Progressing; incontinence   Problem: RH BLADDER ELIMINATION Goal: RH STG MANAGE BLADDER WITH ASSISTANCE Description: STG Manage Bladder With min Assistance Outcome: Not Progressing; incontinence   Problem: RH KNOWLEDGE DEFICIT GENERAL Goal: RH STG INCREASE KNOWLEDGE OF SELF CARE AFTER HOSPITALIZATION Description: Patient will be able to demonstrate knowledge of medication manage, dietary restrictions, safety precautions, with handouts, education materials and min assist from staff. Outcome: Not Progressing; confusion

## 2020-06-10 NOTE — Progress Notes (Signed)
Order for urinalysis attempted to use condom cath for clean cath urine but nothing went to condom cath .  Patient had 2 incontinent episode of urine coming from rectum  probably . Patient has history of fistula and rectal tube was discontinued yesterday.

## 2020-06-10 NOTE — Progress Notes (Signed)
[place] PHYSICAL MEDICINE & REHABILITATION PROGRESS NOTE   Subjective/Complaints: Did not sleep well last night, daughter notes increased confusion today. UA/UC ordered, increased seroquel to 50mg  HS- discussed plan with daughter and she is in agreement Daughter notes he is much more comfortable without rectal tube No other complaints  ROS: Patient denies fever, rash, sore throat, blurred vision, nausea, vomiting, diarrhea, shortness of breath or chest pain, joint or back pain, headache, or mood change.      Objective:   No results found. Recent Labs    06/08/20 0421  WBC 6.6  HGB 10.3*  HCT 33.7*  PLT 272   Recent Labs    06/08/20 0421  NA 138  K 3.8  CL 107  CO2 20*  GLUCOSE 99  BUN 22  CREATININE 1.02  CALCIUM 9.3    Intake/Output Summary (Last 24 hours) at 06/10/2020 1437 Last data filed at 06/10/2020 1300 Gross per 24 hour  Intake 594 ml  Output --  Net 594 ml        Physical Exam: Vital Signs Blood pressure 110/68, pulse 76, temperature (!) 97.5 F (36.4 C), temperature source Oral, resp. rate 16, height 6\' 3"  (1.905 m), weight 82.6 kg, SpO2 96 %. Gen: no distress, normal appearing HEENT: oral mucosa pink and moist, NCAT Cardio: Reg rate Chest: normal effort, normal rate of breathing Abd: soft, non-distended Ext: no edema Psych: pleasant and cooperative Skin: No evidence of breakdown, no evidence of rash Musculoskeletal: Full ROM, No pain with AROM or PROM in the neck, trunk, or extremities. Posture appropriate  Moto 4/5 BLE, sensation intact, tone is normal, LUE 3-4/5     Assessment/Plan: 1. Functional deficits which require 3+ hours per day of interdisciplinary therapy in a comprehensive inpatient rehab setting.  Physiatrist is providing close team supervision and 24 hour management of active medical problems listed below.  Physiatrist and rehab team continue to assess barriers to discharge/monitor patient progress toward functional and  medical goals  Care Tool:  Bathing  Bathing activity did not occur: Safety/medical concerns Body parts bathed by patient: Left arm,Right arm,Chest,Abdomen,Face,Front perineal area,Right upper leg,Left upper leg   Body parts bathed by helper: Right lower leg,Left lower leg,Buttocks     Bathing assist Assist Level: Moderate Assistance - Patient 50 - 74%     Upper Body Dressing/Undressing Upper body dressing   What is the patient wearing?: Pull over shirt    Upper body assist Assist Level: Minimal Assistance - Patient > 75%    Lower Body Dressing/Undressing Lower body dressing      What is the patient wearing?: Pants,Incontinence brief     Lower body assist Assist for lower body dressing: Maximal Assistance - Patient 25 - 49%     Toileting Toileting Toileting Activity did not occur (Clothing management and hygiene only): N/A (no void or bm) (patient has a rectal tube in- no void)  Toileting assist Assist for toileting: Maximal Assistance - Patient 25 - 49%     Transfers Chair/bed transfer  Transfers assist  Chair/bed transfer activity did not occur: Safety/medical concerns  Chair/bed transfer assist level: Minimal Assistance - Patient > 75%     Locomotion Ambulation   Ambulation assist      Assist level: Minimal Assistance - Patient > 75% Assistive device: Walker-rolling Max distance: 60   Walk 10 feet activity   Assist  Walk 10 feet activity did not occur: Safety/medical concerns  Assist level: Minimal Assistance - Patient > 75% Assistive device: Walker-rolling  Walk 50 feet activity   Assist Walk 50 feet with 2 turns activity did not occur: Safety/medical concerns  Assist level: Minimal Assistance - Patient > 75% Assistive device: Walker-rolling    Walk 150 feet activity   Assist Walk 150 feet activity did not occur: Safety/medical concerns  Assist level: Minimal Assistance - Patient > 75% Assistive device: Walker-rolling    Walk 10  feet on uneven surface  activity   Assist Walk 10 feet on uneven surfaces activity did not occur: Safety/medical concerns         Wheelchair     Assist Will patient use wheelchair at discharge?: Yes Type of Wheelchair: Manual    Wheelchair assist level: Minimal Assistance - Patient > 75% Max wheelchair distance: 20'    Wheelchair 50 feet with 2 turns activity    Assist        Assist Level: Maximal Assistance - Patient 25 - 49%   Wheelchair 150 feet activity     Assist      Assist Level: Total Assistance - Patient < 25%   Blood pressure 110/68, pulse 76, temperature (!) 97.5 F (36.4 C), temperature source Oral, resp. rate 16, height 6\' 3"  (1.905 m), weight 82.6 kg, SpO2 96 %.  Medical Problem List and Plan: 1.  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 He had baseline memory/cognitive deficits prior to this hospitalization. His most recent CT demonstrates diffuse atrophy. He may have been a little hypoxic at some point peri-operatively.  Daughter also told me that he had mild left arm weakness d/t neck/ACDF. Left arm was also quite swollen while in acute.  2.  Antithrombotics: -DVT/anticoagulation: Eliquis             -antiplatelet therapy: N/A 3. Pain Management: Lidoderm patch only.    -no narcs d/t delirium             Monitor with increased exertion 4. Mood: Provide emotional support             -antipsychotic agents: increase Seroquel to 50mg  as did not sleep well last night.    -continue sleep chart 5. Neuropsych: This patient is not fully capable of making decisions on his own behalf. Neuropsych eval today, appears encephalopathic , he misttook his daughter for wife during OT session 12/5 Neuropsych eval   -see above. Pt had baseline  cognitive deficits  -12/7 UCX with multispecies  -12/12: daughter notes worsened delirium today. Likely because he did not sleep well, but will order UA/UC  6. Skin/Wound Care: Routine skin checks 7. Fluids/Electrolytes/Nutrition:               CMP reviewed, albumin low, ongoing AKI  -added megace for appetite--eats 25-50%. Not a big eater per daughter  -continue protein supps  12/9-11 continues to be eating 60-100% of meals now!    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  AKI.  Improved.  Renal ultrasound no hydronephrosis.   10.  Hypotension.  Monitor with increased mobility. 11.  Atrial fibrillation.  Lopressor 50 mg twice daily, continue Eliquis.  Cardiac rate controlled.   controlled 12/9 12.  Acute on chronic anemia.                         Hgb trending up to 9.7 --10.2 on 12/11, will not order repeat given upward trend. 13.  COPD with remote history of tobacco abuse.  Continue nebulizers as directed 14.  Hyperlipidemia.  Lipitor 15.  Prostate cancer with prostatectomy 1992.  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  Follow-up urology as well as gastroenterology services.  Patient currently declined endoscopy.  -rectal tube  -increase questran to bid  12/11:daughter concerned about discomfort of rectal tube and would like to try having it removed. Discussed removing tube today and patient and daughter are agreeable. Discussed risks of skin breakdown and importance of regular cleaning.   12/12: feels more comfortable without rectal tube.  17. Dysphagia: D3 diet, tolerating    LOS: 11 days A FACE TO FACE EVALUATION WAS PERFORMED  Clide Deutscher Jacinta Penalver 06/10/2020, 2:37 PM

## 2020-06-11 ENCOUNTER — Inpatient Hospital Stay (HOSPITAL_COMMUNITY): Payer: Medicare HMO

## 2020-06-11 ENCOUNTER — Inpatient Hospital Stay (HOSPITAL_COMMUNITY): Payer: Medicare HMO | Admitting: Occupational Therapy

## 2020-06-11 ENCOUNTER — Inpatient Hospital Stay (HOSPITAL_COMMUNITY): Payer: Medicare HMO | Admitting: Speech Pathology

## 2020-06-11 NOTE — Progress Notes (Signed)
Patient had a BM during therapy this afternoon, reported there was some blood in stool. Pt had rectal tube removed over the weekend could be some possible irritation from removal. Will continue to monitor BMs.   Dayna Ramus

## 2020-06-11 NOTE — Progress Notes (Signed)
Speech Language Pathology Daily Session Note  Patient Details  Name: Carlos Olson MRN: [id number] Date of Birth: 1940-07-26  Today's Date: 06/11/2020 SLP Individual Time: 0725-0820 SLP Individual Time Calculation (min): 55 min  Short Term Goals: Week 2: SLP Short Term Goal 1 (Week 2): Patient will demonstrate efficient masticaion and complete oral clearance with trials of regular textures without overt s/s of apiration over 2 sessions prior to upgrade with Min verbal cues. SLP Short Term Goal 2 (Week 2): Patient will consume current diet with minimal overt s/s of aspiration and Supervision verbal cues for use of swallowing compensatory strategies. SLP Short Term Goal 3 (Week 2): Patient will demonstrate functional problem solving for basic and familiar tasks with Mod verbal cues. SLP Short Term Goal 4 (Week 2): Patient will demonstrate sustained attention to functional tasks for 15 minutes with Min verbal cues for redirection. SLP Short Term Goal 5 (Week 2): Patient will demonstrate orientation to age, place and time with Min verbal and visual cues. SLP Short Term Goal 6 (Week 2): Paient will self-monitor and correct errors during functional tasks with Mod verbal cues.  Skilled Therapeutic Interventions: Skilled treatment session focused on dysphagia and cognitive goals. SLP facilitated session by providing skilled observation with breakfast of Dys. 3 textures with thin liquids. Patient initially had a large coughing episode prior to PO intake in which he was able to orally expectorate secretions and mucous. Patient consumed breakfast meal without overt s/s of aspiration and required supervision verbal cues for use of swallowing compensatory strategies. Patient was slow to arouse this morning and demonstrated intermittent language of confusion that was easily redirected. Patient was oriented to the date with Min visual cues and was able to utilize his schedule to anticipate upcoming therapy  sessions with extra time. However, total A verbal cues were needed for recall of events from the weekend. Patient left upright in bed with alarm on and all needs within reach. Continue with current plan of care.      Pain No/Denies Pain   Therapy/Group: Individual Therapy  Cesia Orf 06/11/2020, 8:37 AM

## 2020-06-11 NOTE — Progress Notes (Signed)
Physical Therapy Session Note  Patient Details  Name: Carlos Olson MRN: [id number] Date of Birth: 05/06/1941  Today's Date: 06/11/2020 PT Individual Time: 0901-1014 PT Individual Time Calculation (min): 73 min   Short Term Goals: Week 2:  PT Short Term Goal 1 (Week 2): Pt will perform bed mobility consistently with minA. PT Short Term Goal 2 (Week 2): Pt will perform sit to stand transfer consistenly with minA. PT Short Term Goal 3 (Week 2): Pt will ambulate 200' with RW and CGA.  Skilled Therapeutic Interventions/Progress Updates:     Pt received supine in bed and agrees to therapy. Oriented to self and situation but states that it is January, 2021, requiring reorientation to month. Pt states back pain 2-3/10, chronic in nature. PT provides rest breaks and repositioning to manage pain. Just before pt initiates bed mobility, pt verbalizes that he is having incontinence of bowels (no longer has rectal tube). Pt performs bilateral rolling in bed with verbal cues on body mechanics and PT performs pericare and brief change. Supine to sit from flat bed with use of bed rails and minA, with cues on sequencing. Stand pivot to [facility] with minA for initial stand (subsequent stands during session require only CGA). WC transport to gym for time management. Pt ambulates 100' with CGA and RW, requiring 1 brief standing rest break. PT cues for increased upright posture and proximity to RW for safety and proper mechanics. Pt performs seated isometric hip internal rotation/adduction with ball between knees, cued to focus on core contraction and pelvic floor contraction. Pt then performs 1x10 and 2x15 standing minisquats with level 1 theraband around distal thighs. Performed to strengthen hip abductors, core, and improve functional transfer mechanics. Extended seated rest breaks required between each set. Pt then ambulates additional 100' with improved gait speed, though continues to have narrow stride width and  occasionally hitting feet together during gait cycle.   Stand pivot transfer back to bed with RW and CGA. Pt left seated at EOB with RN staff and wife in room.  Therapy Documentation Precautions:  Precautions Precautions: Fall Precaution Comments: trach, flexiseal Restrictions Weight Bearing Restrictions: No    Therapy/Group: Individual Therapy  Breck Coons, PT, DPT 06/11/2020, 4:10 PM

## 2020-06-11 NOTE — Progress Notes (Signed)
[place] PHYSICAL MEDICINE & REHABILITATION PROGRESS NOTE   Subjective/Complaints: Rectal tube removed this weekend per family request. More confusion this weekend? seroquel increased for sleep last night   Objective:   No results found. No results for input(s): WBC, HGB, HCT, PLT in the last 72 hours. No results for input(s): NA, K, CL, CO2, GLUCOSE, BUN, CREATININE, CALCIUM in the last 72 hours.  Intake/Output Summary (Last 24 hours) at 06/11/2020 1049 Last data filed at 06/11/2020 0856 Gross per 24 hour  Intake 480 ml  Output --  Net 480 ml        Physical Exam: Vital Signs Blood pressure (!) 144/82, pulse 75, temperature (!) 97.5 F (36.4 C), resp. rate 18, height 6\' 3"  (1.905 m), weight 81.6 kg, SpO2 96 %. Constitutional: No distress . Vital signs reviewed. HEENT: EOMI, oral membranes moist Neck: supple Cardiovascular: RRR without murmur. No JVD    Respiratory/Chest: CTA Bilaterally without wheezes or rales. Normal effort    GI/Abdomen: BS +, non-tender, non-distended Ext: no clubbing, cyanosis, or edema Psych: pleasant, a little more groggy this am. Skin: No evidence of breakdown, no evidence of rash Musculoskeletal: Full ROM, No pain with AROM or PROM in the neck, trunk, or extremities. Posture appropriate  Moto 4/5 BLE, sensation intact, tone is normal, LUE 3-4/5     Assessment/Plan: 1. Functional deficits which require 3+ hours per day of interdisciplinary therapy in a comprehensive inpatient rehab setting.  Physiatrist is providing close team supervision and 24 hour management of active medical problems listed below.  Physiatrist and rehab team continue to assess barriers to discharge/monitor patient progress toward functional and medical goals  Care Tool:  Bathing  Bathing activity did not occur: Safety/medical concerns Body parts bathed by patient: Left arm,Right arm,Chest,Abdomen,Face,Front perineal area,Right upper leg,Left upper leg   Body parts  bathed by helper: Right lower leg,Left lower leg,Buttocks     Bathing assist Assist Level: Moderate Assistance - Patient 50 - 74%     Upper Body Dressing/Undressing Upper body dressing   What is the patient wearing?: Pull over shirt    Upper body assist Assist Level: Minimal Assistance - Patient > 75%    Lower Body Dressing/Undressing Lower body dressing      What is the patient wearing?: Pants,Incontinence brief     Lower body assist Assist for lower body dressing: Maximal Assistance - Patient 25 - 49%     Toileting Toileting Toileting Activity did not occur (Clothing management and hygiene only): N/A (no void or bm) (patient has a rectal tube in- no void)  Toileting assist Assist for toileting: Maximal Assistance - Patient 25 - 49%     Transfers Chair/bed transfer  Transfers assist  Chair/bed transfer activity did not occur: Safety/medical concerns  Chair/bed transfer assist level: Minimal Assistance - Patient > 75%     Locomotion Ambulation   Ambulation assist      Assist level: Minimal Assistance - Patient > 75% Assistive device: Walker-rolling Max distance: 60   Walk 10 feet activity   Assist  Walk 10 feet activity did not occur: Safety/medical concerns  Assist level: Minimal Assistance - Patient > 75% Assistive device: Walker-rolling   Walk 50 feet activity   Assist Walk 50 feet with 2 turns activity did not occur: Safety/medical concerns  Assist level: Minimal Assistance - Patient > 75% Assistive device: Walker-rolling    Walk 150 feet activity   Assist Walk 150 feet activity did not occur: Safety/medical concerns  Assist level: Minimal  Assistance - Patient > 75% Assistive device: Walker-rolling    Walk 10 feet on uneven surface  activity   Assist Walk 10 feet on uneven surfaces activity did not occur: Safety/medical concerns         Wheelchair     Assist Will patient use wheelchair at discharge?: Yes Type of  Wheelchair: Manual    Wheelchair assist level: Minimal Assistance - Patient > 75% Max wheelchair distance: 20'    Wheelchair 50 feet with 2 turns activity    Assist        Assist Level: Maximal Assistance - Patient 25 - 49%   Wheelchair 150 feet activity     Assist      Assist Level: Total Assistance - Patient < 25%   Blood pressure (!) 144/82, pulse 75, temperature (!) 97.5 F (36.4 C), resp. rate 18, height 6\' 3"  (1.905 m), weight 81.6 kg, SpO2 96 %.  Medical Problem List and Plan: 1.  Debility secondary to AAA repair 36/14/4315 complicated by acute respiratory failure/MSSA pneumonia             -patient may not shower             -ELOS/Goals: Supervision/Mod I 12/22             Continue PT, OT, SLP  -per daughter pt had baseline memory/cognitive deficits prior to this hospitalization. His most recent CT demonstrates diffuse atrophy. He may have been a little hypoxic at some point peri-operatively.  Daughter also told me that he had mild left arm weakness d/t neck/ACDF. Left arm was also quite swollen while in acute.  2.  Antithrombotics: -DVT/anticoagulation: Eliquis             -antiplatelet therapy: N/A 3. Pain Management: Lidoderm patch only.    -no narcs d/t delirium             Monitor with increased exertion 4. Mood: Provide emotional support             -antipsychotic agents: increase Seroquel to 50mg  as did not sleep well last night.    -continue sleep chart 5. Neuropsych: This patient is not fully capable of making decisions on his own behalf. Neuropsych eval today, appears encephalopathic , he misttook his daughter for wife during OT session 12/5 Neuropsych eval   -see above. Pt had baseline cognitive deficits  -12/7 UCX with multispecies  -12/12: daughter notes worsened delirium today. Likely because he did not sleep well  12/13 UA/UCX ordered but not collected 6. Skin/Wound Care: Routine skin checks 7. Fluids/Electrolytes/Nutrition:                CMP reviewed, albumin low, ongoing AKI  -added megace for appetite--eats 25-50%. 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  AKI.  Improved.  Renal ultrasound no hydronephrosis.   10.  Hypotension.               Monitor with increased mobility. 11.  Atrial fibrillation.  Lopressor 50 mg twice daily, continue Eliquis.  Cardiac rate controlled.   controlled 12/9 12.  Acute on chronic anemia.  Hgb trending up to 9.7 --10.2 on 12/11, will not order repeat given upward trend. 13.  COPD with remote history of tobacco abuse.  Continue nebulizers as directed 14.  Hyperlipidemia.  Lipitor 15.  Prostate cancer with prostatectomy 1992.  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  Follow-up urology as well as gastroenterology services.  Patient currently declined endoscopy.  -rectal tube  -increase questran to bid  12/11:daughter concerned about discomfort of rectal tube and asked to have it removed. She is aware of the risks of skin breakdown and importance of regular cleaning.   12/12: feels more comfortable without rectal tube.   12/13 reaching out to urology and GI regarding any other ideas regarding his GI output as we approach discharge next week 17. Dysphagia: D3 diet, tolerating    LOS: 12 days A FACE TO FACE EVALUATION WAS PERFORMED  Meredith Staggers 06/11/2020, 10:49 AM

## 2020-06-11 NOTE — Progress Notes (Signed)
Occupational Therapy Session Note  Patient Details  Name: Carlos Olson MRN: [id number] Date of Birth: Aug 02, 1940  Today's Date: 06/11/2020 OT Individual Time: 1400-1500 OT Individual Time Calculation (min): 60 min    Short Term Goals: Week 2:  OT Short Term Goal 1 (Week 2): Patient will maintain standing at the sink for 1 minute in prep for BADL tsk OT Short Term Goal 2 (Week 2): Patient will maintain standing balance within BADL task with no more than mod A OT Short Term Goal 3 (Week 2): Pt will perform UB dressing with Mod A  Skilled Therapeutic Interventions/Progress Updates:    Patient in bed, wife present at start and end of session.  He denies pain, supine to sitting edge of bed with min A. Sit to stand and ambulation with RW to/from bed, toilet, shower bench and w/c with CGA/min A.  Continent/incontinent of BM.  Max A for CM in stance, mod A for hygiene.  Completed shower seated on shower bench - overall mod A for thoroughness with hair washing, to wash buttocks and lower legs.  Donned t-shirt and incontinence brief while seated on shower bench - mod A for tshirt with cues for technique, max A for brief.  Returned to edge of bed to donn pants and sweatshirt - max A for pants, min A for sweat shirt with mod cues.  Min A to comb hair.  Reviewed strategies for continence/bladder management.  To high back w/c at close of session, seat belt alarm set and call bell in hand.    Therapy Documentation Precautions:  Precautions Precautions: Fall Precaution Comments: trach, flexiseal Restrictions Weight Bearing Restrictions: No  Therapy/Group: Individual Therapy  Carlos Levering 06/11/2020, 7:52 AM

## 2020-06-12 ENCOUNTER — Inpatient Hospital Stay (HOSPITAL_COMMUNITY): Payer: Medicare HMO | Admitting: Physical Therapy

## 2020-06-12 ENCOUNTER — Inpatient Hospital Stay (HOSPITAL_COMMUNITY): Payer: Medicare HMO | Admitting: Speech Pathology

## 2020-06-12 ENCOUNTER — Inpatient Hospital Stay (HOSPITAL_COMMUNITY): Payer: Medicare HMO | Admitting: Occupational Therapy

## 2020-06-12 ENCOUNTER — Inpatient Hospital Stay (HOSPITAL_COMMUNITY): Payer: Medicare HMO

## 2020-06-12 MED ORDER — GERHARDT'S BUTT CREAM
TOPICAL_CREAM | Freq: Three times a day (TID) | CUTANEOUS | Status: DC
  Administered 2020-06-18: 1 via TOPICAL
  Filled 2020-06-12: qty 1

## 2020-06-12 NOTE — Patient Care Conference (Signed)
Inpatient RehabilitationTeam Conference and Plan of Care Update Date: 06/12/2020   Time: 10:46 AM    Patient Name: NOA CONSTANTE      Medical Record Number: [id number]  Date of Birth: 11/07/1940 Sex: Male         Room/Bed: 4W19C/4W19C-01 Payor Info: Payor: [employer] MEDICARE / Plan: [employer]ET[employer] MEDICARE HMO/PPO / Product Type: *No Product type* /    Admit Date/Time:  05/30/2020  6:28 PM  Primary Diagnosis:  [facility] Problems: Principal Problem:   Debility Active Problems:   Tracheostomy present ([place])   Moderate hypoxic-ischemic encephalopathy    Expected Discharge Date: Expected Discharge Date: 06/20/20  Team Members Present: Physician leading conference: Dr. Alger Simons Care Coodinator Present: Loralee Pacas, LCSWA;Madelline Eshbach Creig Hines, RN, BSN, [place] Nurse Present: Other (comment) Philip Aspen, RN) PT Present: Tereasa Coop, PT OT Present: Cherylynn Ridges, OT SLP Present: Weston Anna, SLP PPS Coordinator present : Ileana Ladd, Burna Mortimer, SLP     Current Status/Progress Goal Weekly Team Focus  Bowel/Bladder   Rectal tube was previously removed per family request. Remains Incontinent bowel/bladder both per rectum  regain continence  provided incontinence care q 2h   Swallow/Nutrition/ Hydration   Dys. 3 textures with thin liquids, Supervision  Mod I  Tolerance of current diet, use of strategies, trials of regular textures   ADL's   Min/Mod A overall, CGA ambulation  Supervision/CGA      Mobility             Communication             Safety/Cognition/ Behavioral Observations  Min-Mod A  Min A-Supervision  attention, complex problem solving, recall and awareness   Pain   denies pain         Skin   buttom and scrotum red but otherwise intact  maintain skin integrity  inconinence care q2h and prn     Discharge Planning:  D/c to home with 24/7 care from wife, dtr and son in law.   Team Discussion: Cognitively looks better, trach is out, order for UA  discontinued. No complaints of pain, pink/red bottom. Incontinent of B/B. Family education set up for this week and next. OT reports patient min-mod assist, left arm ataxic. Pt reports patient ambulated 150' with RW. SLP reports patient's cognition fluctuates and he does a lot of clearing of mucus and secretions from previous night. Patient on target to meet rehab goals: yes  *See Care Plan and progress notes for long and short-term goals.   Revisions to Treatment Plan:  Continue to work on weekly goals  Teaching Needs: Continue with family education  Current Barriers to Discharge: Home enviroment access/layout, Incontinence, Wound care, Behavior and Nutritional means  Possible Resolutions to Barriers: Continue with current medications, offer nutritional supplements, educate on wound care, continue timed toileting, provide emotional support to patient and family.     Medical Summary Current Status: bladder incontinence but urine clear. still frequent, loose stooling. eating better. cognitively waxes and wanes  Barriers to Discharge: Behavior;Medical stability   Possible Resolutions to [employer] Focus: maximizing nutrition, consulting with specialists re: bladder/bowel, sleep restoration   Continued Need for Acute Rehabilitation Level of Care: The patient requires daily medical management by a physician with specialized training in physical medicine and rehabilitation for the following reasons: Direction of a multidisciplinary physical rehabilitation program to maximize functional independence : Yes Medical management of patient stability for increased activity during participation in an intensive rehabilitation regime.: Yes Analysis of laboratory values and/or  radiology reports with any subsequent need for medication adjustment and/or medical intervention. : Yes   I attest that I was present, lead the team conference, and concur with the assessment and plan of the  team.   Cristi Loron 06/12/2020, 3:37 PM

## 2020-06-12 NOTE — Progress Notes (Signed)
Physical Therapy Session Note  Patient Details  Name: QUANTA ROHER MRN: [id number] Date of Birth: 1940-12-14  Today's Date: 06/12/2020 PT Individual Time: 6283-1517 PT Treatment Time: 43 min  Short Term Goals: Week 2:  PT Short Term Goal 1 (Week 2): Pt will perform bed mobility consistently with minA. PT Short Term Goal 2 (Week 2): Pt will perform sit to stand transfer consistenly with minA. PT Short Term Goal 3 (Week 2): Pt will ambulate 200' with RW and CGA.  Skilled Therapeutic Interventions/Progress Updates:     Pt received supine in bed and agrees to therapy. MinA for supine to sit with bed features and HOB elevated. MinA for sit to stand. Pt performs ambulatory transfer to toilet with RW. Pt performs pericare with setup assistance. CGA for stand step transfer back to WC. WC transport to gym for time management. Stand pivot transfer to Nustep with CGA. Pt does not complain of pain. Pt completes x10:00 at workload of 3 with steps per minute >40. Pt takes 3-4 rest breaks during activity. Performed for strength and endurance training. Pt then ambulates x50' with RW and CGA, then requires unplanned seated rest break due to fatigue. WC transport back to room due to fatigue. Stand pviot back to bed with supervision. Sit to supine with minA. Left supine in bed with alarm intact and all needs within reach.  Therapy Documentation Precautions:  Precautions Precautions: Fall Precaution Comments: trach, flexiseal Restrictions Weight Bearing Restrictions: No  Therapy/Group: Individual Therapy  Breck Coons, PT, DPT 06/12/2020, 8:48 PM

## 2020-06-12 NOTE — Progress Notes (Addendum)
Physical Therapy Session Note  Patient Details  Name: Carlos Olson MRN: [id number] Date of Birth: 10-24-1940  Today's Date: 06/12/2020 PT Individual Time: 1115-1200 PT Individual Time Calculation (min): 45 min   Short Term Goals: Week 1:  PT Short Term Goal 1 (Week 1): Patient to perfom bed mobility with min A 50% of the time PT Short Term Goal 1 - Progress (Week 1): Progressing toward goal PT Short Term Goal 2 (Week 1): Patient to perform sit to stand transfers with mod A PT Short Term Goal 2 - Progress (Week 1): Met PT Short Term Goal 4 (Week 1): Patient to ambulate 17' with RW and mod A Week 2:  PT Short Term Goal 1 (Week 2): Pt will perform bed mobility consistently with minA. PT Short Term Goal 2 (Week 2): Pt will perform sit to stand transfer consistenly with minA. PT Short Term Goal 3 (Week 2): Pt will ambulate 200' with RW and CGA.  Skilled Therapeutic Interventions/Progress Updates:    pt received in bed and agreeable to therapy, son in law present. Pt reported 2/10 pain at start of session and requested medication, nursing brought to bedside for this and gave medications to pt. Pt agreeable to leave room for therapy. Pt directed in supine>sit min A for trunk support into sitting from flat bed, pt reported slight dizziness at EOB but quickly resolved. Pt then returned to supine CGA and reported he was tired and wanted to rest for a moment. Pt then agreeable to supine>sit min A, donning shoes in sitting at mod A to complete. Pt directed in Sit to stand and step transfer to [facility] with 2 attempts to complete with Rolling walker min A for stability, pt required single step simple cues to complete all tasks effectively. Pt taken to gym in [facility] total A for time. Pt directed in car transfer min A to enter car for BLE management at end range to complete and CGA to exit car. Pt then directed in gait training for 80' min A for stability with Rolling walker with VC for trunk extension, increased step  width and BOS, and walker proximity. Pt returned to room in [facility] for energy conservation, requested to return to bed, directed in Sit to stand from WC min A and x5 steps to bedside with Rolling walker min A, sit>supine CGA. Pt left in bed, alarm set, All needs in reach and in good condition. Call light in hand.  And family present.   Therapy Documentation Precautions:  Precautions Precautions: Fall Precaution Comments: trach, flexiseal Restrictions Weight Bearing Restrictions: No General:   Vital Signs:   Pain: Pain Assessment Pain Scale: 0-10 Pain Score: 2  Pain Type: Acute pain Pain Location: Back Pain Orientation: Lower Pain Descriptors / Indicators: Aching Pain Frequency: Intermittent Pain Onset: With Activity Patients Stated Pain Goal: 0 Pain Intervention(s): Medication (See eMAR) (tylenol given) Mobility:   Locomotion :    Trunk/Postural Assessment :    Balance:   Exercises:   Other Treatments:      Therapy/Group: Individual Therapy  Junie Panning 06/12/2020, 2:18 PM

## 2020-06-12 NOTE — Progress Notes (Signed)
Speech Language Pathology Daily Session Note  Patient Details  Name: Carlos Olson MRN: [id number] Date of Birth: 10-20-1940  Today's Date: 06/12/2020 SLP Individual Time: 1320-1400 SLP Individual Time Calculation (min): 40 min  Short Term Goals: Week 2: SLP Short Term Goal 1 (Week 2): Patient will demonstrate efficient masticaion and complete oral clearance with trials of regular textures without overt s/s of apiration over 2 sessions prior to upgrade with Min verbal cues. SLP Short Term Goal 2 (Week 2): Patient will consume current diet with minimal overt s/s of aspiration and Supervision verbal cues for use of swallowing compensatory strategies. SLP Short Term Goal 3 (Week 2): Patient will demonstrate functional problem solving for basic and familiar tasks with Mod verbal cues. SLP Short Term Goal 4 (Week 2): Patient will demonstrate sustained attention to functional tasks for 15 minutes with Min verbal cues for redirection. SLP Short Term Goal 5 (Week 2): Patient will demonstrate orientation to age, place and time with Min verbal and visual cues. SLP Short Term Goal 6 (Week 2): Paient will self-monitor and correct errors during functional tasks with Mod verbal cues.  Skilled Therapeutic Interventions: Skilled treatment session focused on dysphagia and cognitive goals. SLP facilitated session by providing skilled observation with trials of regular textures. Patient demonstrated efficient mastication with complete oral clearance without overt s/s of aspiration. Recommend trial tray prior to upgrade. SLP also facilitated session by providing Max A verbal cues for problem solving and attention during a complex medication management task. Patient with intermittent language of confusion that was easily redirected. Patient left upright in bed with alarm on and all needs within reach. Continue with current plan of care.      Pain Pain Assessment Pain Score: 2   Therapy/Group: Individual  Therapy  Carlos Olson 06/12/2020, 3:35 PM

## 2020-06-12 NOTE — Progress Notes (Signed)
[facility] PHYSICAL MEDICINE & REHABILITATION PROGRESS NOTE   Subjective/Complaints: Pt up in bed eating breakfast. Slept better last night. Sl productive cough when he woke up. Loose stool  ROS: Patient denies fever, rash, sore throat, blurred vision, nausea, vomiting, diarrhea, cough, shortness of breath or chest pain, joint or back pain, headache, or mood change.    Objective:   No results found. No results for input(s): WBC, HGB, HCT, PLT in the last 72 hours. No results for input(s): NA, K, CL, CO2, GLUCOSE, BUN, CREATININE, CALCIUM in the last 72 hours.  Intake/Output Summary (Last 24 hours) at 06/12/2020 1110 Last data filed at 06/12/2020 0834 Gross per 24 hour  Intake 660 ml  Output --  Net 660 ml        Physical Exam: Vital Signs Blood pressure 125/71, pulse 75, temperature 98.1 F (36.7 C), temperature source Oral, resp. rate 20, height 6\' 3"  (1.905 m), weight 82.5 kg, SpO2 96 %. Constitutional: No distress . Vital signs reviewed. HEENT: EOMI, oral membranes moist Neck: supple Cardiovascular: RRR without murmur. No JVD    Respiratory/Chest: CTA Bilaterally without wheezes or rales--occ clearing throat. Normal effort    GI/Abdomen: BS +, non-tender, non-distended Ext: no clubbing, cyanosis, or edema Psych: pleasant and cooperative. A little more focuse Skin: No evidence of breakdown, no evidence of rash. Buttocks not witnessed Musculoskeletal: Full ROM, No pain with AROM or PROM in the neck, trunk, or extremities. Posture appropriate  [place] 4/5 BLE, sensation intact, tone is normal, LUE 3-4/5     Assessment/Plan: 1. Functional deficits which require 3+ hours per day of interdisciplinary therapy in a comprehensive inpatient rehab setting.  Physiatrist is providing close team supervision and 24 hour management of active medical problems listed below.  Physiatrist and rehab team continue to assess barriers to discharge/monitor patient progress toward functional  and medical goals  Care Tool:  Bathing  Bathing activity did not occur: Safety/medical concerns Body parts bathed by patient: Left arm,Right arm,Chest,Abdomen,Face,Front perineal area,Right upper leg,Left upper leg   Body parts bathed by helper: Right lower leg,Left lower leg,Buttocks     Bathing assist Assist Level: Moderate Assistance - Patient 50 - 74%     Upper Body Dressing/Undressing Upper body dressing   What is the patient wearing?: Pull over shirt    Upper body assist Assist Level: Minimal Assistance - Patient > 75%    Lower Body Dressing/Undressing Lower body dressing      What is the patient wearing?: Pants,Incontinence brief     Lower body assist Assist for lower body dressing: Maximal Assistance - Patient 25 - 49%     Toileting Toileting Toileting Activity did not occur (Clothing management and hygiene only): N/A (no void or bm) (patient has a rectal tube in- no void)  Toileting assist Assist for toileting: Maximal Assistance - Patient 25 - 49%     Transfers Chair/bed transfer  Transfers assist  Chair/bed transfer activity did not occur: Safety/medical concerns  Chair/bed transfer assist level: Minimal Assistance - Patient > 75%     Locomotion Ambulation   Ambulation assist      Assist level: Contact Guard/Touching assist Assistive device: Walker-rolling Max distance: 100'   Walk 10 feet activity   Assist  Walk 10 feet activity did not occur: Safety/medical concerns  Assist level: Contact Guard/Touching assist Assistive device: Walker-rolling   Walk 50 feet activity   Assist Walk 50 feet with 2 turns activity did not occur: Safety/medical concerns  Assist level: Contact Guard/Touching  assist Assistive device: Walker-rolling    Walk 150 feet activity   Assist Walk 150 feet activity did not occur: Safety/medical concerns  Assist level: Minimal Assistance - Patient > 75% Assistive device: Walker-rolling    Walk 10 feet on  uneven surface  activity   Assist Walk 10 feet on uneven surfaces activity did not occur: Safety/medical concerns         Wheelchair     Assist Will patient use wheelchair at discharge?: Yes Type of Wheelchair: Manual    Wheelchair assist level: Minimal Assistance - Patient > 75% Max wheelchair distance: 20'    Wheelchair 50 feet with 2 turns activity    Assist        Assist Level: Maximal Assistance - Patient 25 - 49%   Wheelchair 150 feet activity     Assist      Assist Level: Total Assistance - Patient < 25%   Blood pressure 125/71, pulse 75, temperature 98.1 F (36.7 C), temperature source Oral, resp. rate 20, height 6\' 3"  (1.905 m), weight 82.5 kg, SpO2 96 %.  Medical Problem List and Plan: 1.  Debility secondary to AAA repair 95/18/8416 complicated by acute respiratory failure/MSSA pneumonia             -patient may not shower             -ELOS/Goals: Supervision/Mod I 12/22             Continue PT, OT, SLP  -per daughter pt had baseline memory/cognitive deficits prior to this hospitalization. His most recent CT demonstrates diffuse atrophy. He may have been a little hypoxic at some point peri-operatively.  Daughter also told me that he had mild left arm weakness d/t neck/ACDF. Left arm was also quite swollen while in acute.  2.  Antithrombotics: -DVT/anticoagulation: Eliquis             -antiplatelet therapy: N/A 3. Pain Management: Lidoderm patch only.    -no narcs d/t delirium             Monitor with increased exertion 4. Mood: Provide emotional support             -antipsychotic agents: continue seroquel at 50mg     -continue sleep chart 5. Neuropsych: This patient is not fully capable of making decisions on his own behalf. Neuropsych eval today, appears encephalopathic , he misttook his daughter for wife during OT session 12/5 Neuropsych eval   -see above. Pt had baseline cognitive deficits  -12/7 UCX with multispecies  -12/12: daughter  notes worsened delirium today. Likely because he did not sleep well  12/14 UA/UCX ordered but not collected---at this point, I don't believe we need it. Will dc ordered 6. Skin/Wound Care: Routine skin checks 7. Fluids/Electrolytes/Nutrition:               CMP reviewed, albumin low, ongoing AKI  -added megace for appetite--eats 25-50%. Not a big eater per daughter  -continue protein supps  12/9-14 continues to be eating 60-100% of meals now    8.  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  AKI.  Improved.  Renal ultrasound no hydronephrosis.   10.  Hypotension.               Monitor with increased mobility. 11.  Atrial fibrillation.  Lopressor 50 mg  twice daily, continue Eliquis.  Cardiac rate controlled.   controlled 12/9 12.  Acute on chronic anemia.                         Hgb trending up to 9.7 --10.2 on 12/11, will not order repeat given upward trend. 13.  COPD with remote history of tobacco abuse.  Continue nebulizers as directed 14.  Hyperlipidemia.  Lipitor 15.  Prostate cancer with prostatectomy 1992.  Follow-up outpatient 16.  Colovesical fistula.    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  Follow-up urology as well as gastroenterology services.  Patient currently declined endoscopy. At this point, it doesn't appear that there is vesicular component to fistula/loose stool  -increase questran to bid  12/11:daughter concerned about discomfort of rectal tube and asked to have it removed. She is aware of the risks of skin breakdown and importance of regular cleaning.   12/12: feels more comfortable without rectal tube.   12/13 spoke with urology/GI. No imminent plans for f/u given that he's passing clear, uncontaminated urine and is not a candidate for surgery, further intervention.    -dc home with rectal tube or management plan for loose  stool   -add EPBC for buttocks 17. Dysphagia: D3 diet, tolerating    LOS: 13 days A FACE TO FACE EVALUATION WAS PERFORMED  Meredith Staggers 06/12/2020, 11:10 AM

## 2020-06-12 NOTE — Progress Notes (Signed)
Patient ID: DIANA ARMIJO, male   DOB: 1941/04/26, 79 y.o.   MRN: [id number]  SW met with pt and pt son in law to provide updates from team conference,give HHA list, and no changes in d/c date. Son in law reports pt wife and mother in law will be at family edu tomorrow. SW to inform front desk. Pt aware SW to follow-up with his dtr [name].   SW spoke with pt dtr [name] [phone]) to provide updates from team conference, [place] list left in room, and no change in d/c date. SW to leave private duty nursing list in room, as dtr reports she intends to find aide service for someone to be there with parents when she is at work.   Loralee Pacas, MSW, [place] Office: [phone] Cell: [phone] Fax: [phone]

## 2020-06-12 NOTE — Progress Notes (Signed)
Nutrition Follow-up  DOCUMENTATION CODES:   Severe malnutrition in context of acute illness/injury  INTERVENTION:   - Continue Boost Breeze po TID, each supplement provides 250 kcal and 9 grams of protein  - Continue Magic Cup TID with meals, each supplement provides 290 kcal and 9 grams of protein  - Continue ProSource Plus 30 ml po BID, each supplement provides 100 kcal and 15 grams of protein  - Continue to encourage PO intake  NUTRITION DIAGNOSIS:   Severe Malnutrition related to acute illness (elective AAA reapir and right common femoral artery aneurysm repair) as evidenced by moderate fat depletion,severe muscle depletion,percent weight loss (7.4% weight loss in 2.5 months).  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 Presented on 04/17/20 for elective AAA repair as well as right common femoral artery aneurysm repair. A Cortrak was placed for nutrition support as well as hydration. Pt did require intubation with ventilatory support due to respiratory distress 05/04/20 with tracheostomy performed 05/09/20. 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 Admitted to CIR on 05/30/20.  12/05 - nocturnal TF decreased by MD to stimulate appetite 12/07 - MD d/c nocturnal TF 12/08 - decannulated, Cortrak removed  Noted target d/c date of 12/22.  Per review of notes, rectal tube has been removed. Pt having multiple type 7 bowel movements daily (includes urine due to colovesical fistula).  Reviewed weights. Overall, pt with a 3.1 kg weight loss since  06/01/20. This is a 3.6% weight loss in less than 2 weeks which is significant for timeframe. Lowest weight during CIR admission recorded as 81.6 kg on 12/13. RD will continue to monitor trends. Overall, pt's PO intake has improved.  Spoke with pt at bedside. Pt reports that his appetite and PO intake have improved. Pt reports that he does not like the Ensure Enlive supplements but does drink the [facility] Par which is currently ordered TID. Pt states that he "thinks" that he takes the ProSource Plus which is currently ordered BID. Per [facility] documentation, pt accepting >90% of ProSource Plus supplements over the last week and ~50% of the Boost Breeze supplements over the last week. Will continue with current supplement regimen.  Discussed again with pt the importance of adequate PO intake in healing, maintaining lean muscle mass, and preparing for discharge. Pt expresses understanding.  Meal Completion: 45-100% x last 8 documented meals (averaging 66%)  Medications reviewed and include: ProSource Plus BID, questran 4 grams BID, Boost Breeze TID, nutrisource fiber 1 packet BID, megace 400 mg BID  Labs reviewed.   Diet Order:   Diet Order            DIET DYS 3 Room service appropriate? Yes; Fluid consistency: Thin  Diet effective now                 EDUCATION NEEDS:   Education needs have been addressed  Skin:  Skin Assessment: Skin Integrity Issues: Other: MASD to buttocks  Last BM:  06/12/20 type 7 (combined urine and stool d/t colovesical fistula)  Height:   Ht Readings from Last 1 Encounters:  05/31/20 6\' 3"  (1.905 m)  Weight:   Wt Readings from Last 1 Encounters:  06/12/20 82.5 kg    BMI:  Body mass index is 22.73 kg/m.  Estimated Nutritional Needs:   Kcal:  2200-2400  Protein:  120-145 grams  Fluid:  >/= 2.2 L    Gustavus Bryant, MS, RD, LDN Inpatient Clinical Dietitian Please see AMiON for contact information.

## 2020-06-12 NOTE — Progress Notes (Signed)
Occupational Therapy Session Note  Patient Details  Name: Carlos Olson MRN: [id number] Date of Birth: [birth year]  Today's Date: 06/12/2020 OT Individual Time: 6372-9426 OT Individual Time Calculation (min): 75 min    Short Term Goals: Week 2:  OT Short Term Goal 1 (Week 2): Patient will maintain standing at the sink for 1 minute in prep for BADL tsk OT Short Term Goal 2 (Week 2): Patient will maintain standing balance within BADL task with no more than mod A OT Short Term Goal 3 (Week 2): Pt will perform UB dressing with Mod A  Skilled Therapeutic Interventions/Progress Updates:    Patient greeted semi-reclined in bed and agreeable to OT treatment session. Pt declined any bathing today. Pt came to sitting EOB with min A. Worked on anterior weight shift and reaching forward to don pants, but pt still needed max A to thread pant legs. Sit<>stand with min A and RW, then OT noted pt had been incontinent of urine and BM. Pt agreeable to walk to the bathroom to try to go more. Pt ambulated to bathroom w/ RW and Min A. Pt had more watery BM in commode. Pt worked on his own peri-care, then needed OT assist for thoroughness. Pt ambulated back out of bathroom in similar fashion but needed extended rest break 2/2 fatigue. Worked on standing balance/endurance at the sink with standing grooming tasks. Pt tolerated standing for 2 minutes at longest bout. Pt brought to therapy gym and worked on L UE fine motor control with graded peg board and towel folding tasks. Pt returned to room and left seated in wc with alarm belt on, call bell in reach, and needs met.   Therapy Documentation Precautions:  Precautions Precautions: Fall Precaution Comments: trach, flexiseal Restrictions Weight Bearing Restrictions: No Pain:  denies pain  Therapy/Group: Individual Therapy  Valma Cava 06/12/2020, 9:45 AM

## 2020-06-13 ENCOUNTER — Ambulatory Visit (HOSPITAL_COMMUNITY): Payer: Medicare HMO

## 2020-06-13 ENCOUNTER — Encounter (HOSPITAL_COMMUNITY): Payer: Medicare HMO | Admitting: Speech Pathology

## 2020-06-13 ENCOUNTER — Encounter (HOSPITAL_COMMUNITY): Payer: Medicare HMO

## 2020-06-13 ENCOUNTER — Inpatient Hospital Stay (HOSPITAL_COMMUNITY): Payer: Medicare HMO

## 2020-06-13 NOTE — Progress Notes (Signed)
Physical Therapy Session Note  Patient Details  Name: Carlos Olson MRN: [id number] Date of Birth: 12/15/1940  Today's Date: 06/13/2020 PT Individual Time: 1660-6301 Aand 6010-9323 PT Individual Time Calculation (min): 55 min and 28 min  Short Term Goals: Week 2:  PT Short Term Goal 1 (Week 2): Pt will perform bed mobility consistently with minA. PT Short Term Goal 2 (Week 2): Pt will perform sit to stand transfer consistenly with minA. PT Short Term Goal 3 (Week 2): Pt will ambulate 200' with RW and CGA.  Skilled Therapeutic Interventions/Progress Updates:     Pt received supine in bed and agrees to therapy. Reports slight pain in back. Number not provided. PT provides mobility and rest breakas as needed to manage pain. Wife and daughter present for family ed. PT demonstrates providing verbal cues and minA for pt performing supine<>sit and then has pt's wife provide assistance as pt performs additional rep. Pt performs multiple reps of sit to stand during session with CGA. Ambulatory transfer to toilet with RW and CGA. Pt is partially continent of bowels and performs majority of pericare independently, requiring additional cleaning from PT for thoroughness. Pt ambulates to sink to wash hands and requests seated rest break due to fatigue. WC transport go gym for time management. Pt ambulates x140' with RW. PT provides CGA for initial 100' and then pt's wife steps in and provides CGA for remainder of distance, with PT providing cues on safe positioning and guarding technique. Pt performs car transfer with CGA and cues on sequencing, with use of RW. WC transport back to room. Pt left seated in WC with alarm intact and all needs within reach.  2nd Session: Pt received supine in bed. Easily aroused but voiced fatigue and requesting to rest. PT able to convince pt to participate with gentle encouragement and education. Supine to sit with minA. Stand pivot transfer from bed<>WC during session with CGA.  WC transport to gym. Pt performs NMR on BITS system for activity tolerance and sitting balance with cognitive overlay. Pt  Performs memory game and requires min cues for performance and occasional minA for reaching to L outside BOS. WC transport back to room. MinA for sit to supine. Pt left with alarm intact and all needs within reach.   Therapy Documentation Precautions:  Precautions Precautions: Fall Precaution Comments: trach, flexiseal Restrictions Weight Bearing Restrictions: No   Therapy/Group: Individual Therapy  Breck Coons, PT, DPT 06/13/2020, 3:42 PM

## 2020-06-13 NOTE — Plan of Care (Signed)
  Problem: RH Problem Solving Goal: LTG Patient will demonstrate problem solving for (SLP) Description: LTG:  Patient will demonstrate problem solving for basic/complex daily situations with cues  (SLP) Flowsheets (Taken 06/13/2020 0636) LTG: Patient will demonstrate problem solving for (SLP): Basic daily situations Note: Goal downgraded due to fluctuating cognitive functioning

## 2020-06-13 NOTE — Progress Notes (Signed)
Occupational Therapy Session Note  Patient Details  Name: Carlos Olson MRN: [id number] Date of Birth: 05/28/1941  Today's Date: 06/13/2020 OT Individual Time: 1110-1210 OT Individual Time Calculation (min): 60 min    Short Term Goals: Week 2:  OT Short Term Goal 1 (Week 2): Patient will maintain standing at the sink for 1 minute in prep for BADL tsk OT Short Term Goal 2 (Week 2): Patient will maintain standing balance within BADL task with no more than mod A OT Short Term Goal 3 (Week 2): Pt will perform UB dressing with Mod A  Skilled Therapeutic Interventions/Progress Updates:    Pt received on toilet with NT attending, wife and daughter present for family education. Heavy emphasis placed on energy conservation strategies during ADLs, fall risk prevention, safety in the home, rest breaks, and accessibility. Pt completed toileting with CGA overall. Pt completed ambulatory transfer back to EOB with CGA, use of RW and cueing for seated rest break, pt with SOB following. Pt recovered while edu took place. Pt then agreeable to ADLs EOB. Cueing provided throughout for inclusion of energy conservation strategies and potential uses for AE. Pt completed UB bathing with supervision and donned a new shirt with min A to pull overhead. Mod A provided to don pants and change brief, especially distally. Family reporting they will likely get a CNA to assist at home to reduce burden of care for his wife. Pt completed sit >stand from EOB x2 with min A. Pt returned to supine and was left with all needs met, bed alarm set.   Therapy Documentation Precautions:  Precautions Precautions: Fall Precaution Comments: trach, flexiseal Restrictions Weight Bearing Restrictions: No   Therapy/Group: Individual Therapy  Curtis Sites 06/13/2020, 1:03 PM

## 2020-06-13 NOTE — Progress Notes (Signed)
[facility] PHYSICAL MEDICINE & REHABILITATION PROGRESS NOTE   Subjective/Complaints: Up at times last night d/t loose stool. Comfortable at present. No cough this morning  ROS: Patient denies fever, rash, sore throat, blurred vision, nausea, vomiting,  cough, shortness of breath or chest pain, joint or back pain, headache, or mood change. .    Objective:   No results found. No results for input(s): WBC, HGB, HCT, PLT in the last 72 hours. No results for input(s): NA, K, CL, CO2, GLUCOSE, BUN, CREATININE, CALCIUM in the last 72 hours.  Intake/Output Summary (Last 24 hours) at 06/13/2020 0835 Last data filed at 06/12/2020 2259 Gross per 24 hour  Intake 720 ml  Output --  Net 720 ml        Physical Exam: Vital Signs Blood pressure 121/77, pulse 67, temperature 98 F (36.7 C), resp. rate 18, height 6\' 3"  (1.905 m), weight 82.5 kg, SpO2 96 %. Constitutional: No distress . Vital signs reviewed. HEENT: EOMI, oral membranes moist Neck: supple Cardiovascular: RRR without murmur. No JVD    Respiratory/Chest: CTA Bilaterally without wheezes or rales. Normal effort    GI/Abdomen: BS +, non-tender, non-distended Ext: no clubbing, cyanosis, or edema Psych: pleasant and cooperative, sl disoriented  Skin: No evidence of breakdown, no evidence of rash. Buttocks not examined. Trach stoma closed with scab Musculoskeletal: Full ROM, No pain with AROM or PROM in the neck, trunk, or extremities. Posture appropriate  [place] 4/5 BLE, sensation intact, tone is normal, LUE 3-4/5     Assessment/Plan: 1. Functional deficits which require 3+ hours per day of interdisciplinary therapy in a comprehensive inpatient rehab setting.  Physiatrist is providing close team supervision and 24 hour management of active medical problems listed below.  Physiatrist and rehab team continue to assess barriers to discharge/monitor patient progress toward functional and medical goals  Care Tool:  Bathing  Bathing  activity did not occur: Safety/medical concerns Body parts bathed by patient: Left arm,Right arm,Chest,Abdomen,Face,Front perineal area,Right upper leg,Left upper leg   Body parts bathed by helper: Right lower leg,Left lower leg,Buttocks     Bathing assist Assist Level: Moderate Assistance - Patient 50 - 74%     Upper Body Dressing/Undressing Upper body dressing   What is the patient wearing?: Pull over shirt    Upper body assist Assist Level: Minimal Assistance - Patient > 75%    Lower Body Dressing/Undressing Lower body dressing      What is the patient wearing?: Pants,Incontinence brief     Lower body assist Assist for lower body dressing: Maximal Assistance - Patient 25 - 49%     Toileting Toileting Toileting Activity did not occur (Clothing management and hygiene only): N/A (no void or bm) (patient has a rectal tube in- no void)  Toileting assist Assist for toileting: Maximal Assistance - Patient 25 - 49%     Transfers Chair/bed transfer  Transfers assist  Chair/bed transfer activity did not occur: Safety/medical concerns  Chair/bed transfer assist level: Minimal Assistance - Patient > 75%     Locomotion Ambulation   Ambulation assist      Assist level: Contact Guard/Touching assist Assistive device: Walker-rolling Max distance: 100'   Walk 10 feet activity   Assist  Walk 10 feet activity did not occur: Safety/medical concerns  Assist level: Contact Guard/Touching assist Assistive device: Walker-rolling   Walk 50 feet activity   Assist Walk 50 feet with 2 turns activity did not occur: Safety/medical concerns  Assist level: Contact Guard/Touching assist Assistive device: Walker-rolling  Walk 150 feet activity   Assist Walk 150 feet activity did not occur: Safety/medical concerns  Assist level: Minimal Assistance - Patient > 75% Assistive device: Walker-rolling    Walk 10 feet on uneven surface  activity   Assist Walk 10 feet on  uneven surfaces activity did not occur: Safety/medical concerns         Wheelchair     Assist Will patient use wheelchair at discharge?: Yes Type of Wheelchair: Manual    Wheelchair assist level: Minimal Assistance - Patient > 75% Max wheelchair distance: 20'    Wheelchair 50 feet with 2 turns activity    Assist        Assist Level: Maximal Assistance - Patient 25 - 49%   Wheelchair 150 feet activity     Assist      Assist Level: Total Assistance - Patient < 25%   Blood pressure 121/77, pulse 67, temperature 98 F (36.7 C), resp. rate 18, height 6\' 3"  (1.905 m), weight 82.5 kg, SpO2 96 %.  Medical Problem List and Plan: 1.  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 His most recent CT demonstrates diffuse atrophy. He may have been a little hypoxic at some point peri-operatively.  Daughter also told me that he had mild left arm weakness d/t neck/ACDF. Left arm was also quite swollen while in acute.  2.  Antithrombotics: -DVT/anticoagulation: Eliquis             -antiplatelet therapy: N/A 3. Pain Management: Lidoderm patch only.    -no narcs d/t delirium             Monitor with increased exertion 4. Mood: Provide emotional support             -antipsychotic agents: continue seroquel at 50mg  QHS   -continue sleep chart 5. Neuropsych: This patient is not fully capable of making decisions on his own behalf. Neuropsych eval today, appears encephalopathic , he misttook his daughter for wife during OT session 12/5 Neuropsych eval   -see above. Pt had baseline cognitive deficits  -12/15 cognitive status can wax and wane 6. Skin/Wound Care: Routine skin checks  -EPBC for buttocks 7.  Fluids/Electrolytes/Nutrition:               CMP reviewed, albumin low, ongoing AKI  -added megace for appetite--eats 25-50%. Not a big eater per daughter  -continue protein supps  12/9-15 continues to be eating 60-100% of meals now    8.  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  AKI.  Improved.  Renal ultrasound no hydronephrosis.   10.  Hypotension.               Monitor with increased mobility. 11.  Atrial fibrillation.  Lopressor 50 mg twice daily, continue Eliquis.  Cardiac rate controlled.   controlled 12/9 12.  Acute on chronic anemia.  Hgb trending up to 9.7 --10.2 on 12/11, will not order repeat given upward trend. 13.  COPD with remote history of tobacco abuse.  Continue nebulizers as directed 14.  Hyperlipidemia.  Lipitor 15.  Prostate cancer with prostatectomy 1992.  Follow-up outpatient 16.  Colovesical fistula.    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  Follow-up urology as well as gastroenterology services.  Patient currently declined endoscopy. At this point, it doesn't appear that there is vesicular component to fistula/loose stool  -increase questran to bid  12/11:daughter concerned about discomfort of rectal tube and asked to have it removed. She is aware of the risks of skin breakdown and importance of regular cleaning.   12/12: feels more comfortable without rectal tube.   12/13 spoke with urology/GI. No imminent plans for f/u given that he's passing clear, uncontaminated urine and is not a candidate for surgery, further intervention.   12/15 plan at this time is to dc home with rectal tube or keeping patient as clean and as dry as possible given loose stool   -added EPBC for buttocks   -really nothing more to add to bulk up stool 17. Dysphagia: D3 diet, tolerating    LOS: 14 days A FACE  TO FACE EVALUATION WAS PERFORMED  Meredith Staggers 06/13/2020, 8:35 AM

## 2020-06-13 NOTE — Progress Notes (Signed)
Speech Language Pathology Weekly Progress and Session Note  Patient Details  Name: Carlos Olson MRN: [id number] Date of Birth: 24-Feb-1941  Beginning of progress report period: June 06, 2020 End of progress report period: June 13, 2020  Today's Date: 06/13/2020 SLP Individual Time: 7262-0355 SLP Individual Time Calculation (min): 45 min  Short Term Goals: Week 2: SLP Short Term Goal 1 (Week 2): Patient will demonstrate efficient masticaion and complete oral clearance with trials of regular textures without overt s/s of apiration over 2 sessions prior to upgrade with Min verbal cues. SLP Short Term Goal 1 - Progress (Week 2): Met SLP Short Term Goal 2 (Week 2): Patient will consume current diet with minimal overt s/s of aspiration and Supervision verbal cues for use of swallowing compensatory strategies. SLP Short Term Goal 2 - Progress (Week 2): Met SLP Short Term Goal 3 (Week 2): Patient will demonstrate functional problem solving for basic and familiar tasks with Mod verbal cues. SLP Short Term Goal 3 - Progress (Week 2): Met SLP Short Term Goal 4 (Week 2): Patient will demonstrate sustained attention to functional tasks for 15 minutes with Min verbal cues for redirection. SLP Short Term Goal 4 - Progress (Week 2): Met SLP Short Term Goal 5 (Week 2): Patient will demonstrate orientation to age, place and time with Min verbal and visual cues. SLP Short Term Goal 5 - Progress (Week 2): Met SLP Short Term Goal 6 (Week 2): Paient will self-monitor and correct errors during functional tasks with Mod verbal cues. SLP Short Term Goal 6 - Progress (Week 2): Met    New Short Term Goals: Week 3: SLP Short Term Goal 1 (Week 3): STGS=LTGs due to ELOS  Weekly Progress Updates: Patient continues to make functional gains and has met 6 of 6 STGs this reporting period. Currently, patient is consuming regular textures with thin liquids with minimal overt s/s of aspiration and overall  supervision verbal cues for use of swallowing compensatory strategies. Patient's overall cognitive functioning continues to fluctuate and patient requires overall Min-Mod A verbal cues to complete functional and familiar tasks safely in regards to sustained attention, functional problem solving, awareness and orientation. Patient and family education ongoing. Patient would benefit from continued skilled SLP intervention to maximize his cognitive and swallowing function and overall functional independence prior to discharge.      Intensity: Minumum of 1-2 x/day, 30 to 90 minutes Frequency: 3 to 5 out of 7 days Duration/Length of Stay: 06/20/20 Treatment/Interventions: Dysphagia/aspiration precaution training;Cognitive remediation/compensation;Internal/external aids;Therapeutic Activities;Environmental controls;Cueing hierarchy;Functional tasks;Patient/family education   Daily Session  Skilled Therapeutic Interventions:  Skilled treatment session focused on completion of family education. SLP facilitated session by providing education regarding patient's current swallowing function, diet recommendations, appropriate textures, and swallowing compensatory strategies. SLP facilitated session by providing education regarding patient's current cognitive functioning and strategies to utilize at home to maximize attention, recall, cognitive engagement and overall cognitive recovery and safety. All verbalized understanding and handouts were given to reinforce information. Patient left upright in bed with alarm on and all needs within reach. Continue with current plan of care.      Pain No/Denies Pain   Therapy/Group: Individual Therapy  Jarl Sellitto 06/13/2020, 6:38 AM

## 2020-06-14 ENCOUNTER — Inpatient Hospital Stay (HOSPITAL_COMMUNITY): Payer: Medicare HMO | Admitting: Speech Pathology

## 2020-06-14 ENCOUNTER — Inpatient Hospital Stay (HOSPITAL_COMMUNITY): Payer: Medicare HMO | Admitting: Physical Therapy

## 2020-06-14 ENCOUNTER — Inpatient Hospital Stay (HOSPITAL_COMMUNITY): Payer: Medicare HMO | Admitting: Occupational Therapy

## 2020-06-14 NOTE — Progress Notes (Signed)
[facility] PHYSICAL MEDICINE & REHABILITATION PROGRESS NOTE   Subjective/Complaints: No new issues. Confused this morning. No cough  ROS: Limited due to cognitive/behavioral  .    Objective:   No results found. No results for input(s): WBC, HGB, HCT, PLT in the last 72 hours. No results for input(s): NA, K, CL, CO2, GLUCOSE, BUN, CREATININE, CALCIUM in the last 72 hours.  Intake/Output Summary (Last 24 hours) at 06/14/2020 1100 Last data filed at 06/13/2020 1857 Gross per 24 hour  Intake 240 ml  Output --  Net 240 ml        Physical Exam: Vital Signs Blood pressure (!) 117/59, pulse 63, temperature 97.9 F (36.6 C), resp. rate 20, height 6\' 3"  (1.905 m), weight 81.7 kg, SpO2 95 %. Constitutional: No distress . Vital signs reviewed. HEENT: EOMI, oral membranes moist Neck: supple Cardiovascular: RRR without murmur. No JVD    Respiratory/Chest: CTA Bilaterally with occ upper airway sounds. Normal effort    GI/Abdomen: BS +, non-tender, non-distended Ext: no clubbing, cyanosis, or edema Psych: pleasant but confused  Skin: No evidence of breakdown, no evidence of rash. Buttocks not examined. Trach stoma closed with scab Musculoskeletal: Full ROM, No pain with AROM or PROM in the neck, trunk, or extremities.    [place]oto 4/5 BLE, sensation intact, tone is normal, LUE 3-4/5     Assessment/Plan: 1. Functional deficits which require 3+ hours per day of interdisciplinary therapy in a comprehensive inpatient rehab setting.  Physiatrist is providing close team supervision and 24 hour management of active medical problems listed below.  Physiatrist and rehab team continue to assess barriers to discharge/monitor patient progress toward functional and medical goals  Care Tool:  Bathing  Bathing activity did not occur: Safety/medical concerns Body parts bathed by patient: Left arm,Right arm,Chest,Abdomen,Face,Front perineal area,Right upper leg,Left upper leg   Body parts bathed by  helper: Right lower leg,Left lower leg,Buttocks     Bathing assist Assist Level: Moderate Assistance - Patient 50 - 74%     Upper Body Dressing/Undressing Upper body dressing   What is the patient wearing?: Pull over shirt    Upper body assist Assist Level: Minimal Assistance - Patient > 75%    Lower Body Dressing/Undressing Lower body dressing      What is the patient wearing?: Pants,Incontinence brief     Lower body assist Assist for lower body dressing: Maximal Assistance - Patient 25 - 49%     Toileting Toileting Toileting Activity did not occur (Clothing management and hygiene only): N/A (no void or bm) (patient has a rectal tube in- no void)  Toileting assist Assist for toileting: Maximal Assistance - Patient 25 - 49%     Transfers Chair/bed transfer  Transfers assist  Chair/bed transfer activity did not occur: Safety/medical concerns  Chair/bed transfer assist level: Contact Guard/Touching assist     Locomotion Ambulation   Ambulation assist      Assist level: Contact Guard/Touching assist Assistive device: Walker-rolling Max distance: 140'   Walk 10 feet activity   Assist  Walk 10 feet activity did not occur: Safety/medical concerns  Assist level: Contact Guard/Touching assist Assistive device: Walker-rolling   Walk 50 feet activity   Assist Walk 50 feet with 2 turns activity did not occur: Safety/medical concerns  Assist level: Contact Guard/Touching assist Assistive device: Walker-rolling    Walk 150 feet activity   Assist Walk 150 feet activity did not occur: Safety/medical concerns  Assist level: Minimal Assistance - Patient > 75% Assistive device: Walker-rolling  Walk 10 feet on uneven surface  activity   Assist Walk 10 feet on uneven surfaces activity did not occur: Safety/medical concerns         Wheelchair     Assist Will patient use wheelchair at discharge?: Yes Type of Wheelchair: Manual    Wheelchair  assist level: Minimal Assistance - Patient > 75% Max wheelchair distance: 20'    Wheelchair 50 feet with 2 turns activity    Assist        Assist Level: Maximal Assistance - Patient 25 - 49%   Wheelchair 150 feet activity     Assist      Assist Level: Total Assistance - Patient < 25%   Blood pressure (!) 117/59, pulse 63, temperature 97.9 F (36.6 C), resp. rate 20, height 6\' 3"  (1.905 m), weight 81.7 kg, SpO2 95 %.  Medical Problem List and Plan: 1.  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 His most recent CT demonstrates diffuse atrophy. He may have been a little hypoxic at some point peri-operatively.  Daughter also told me that he had mild left arm weakness d/t neck/ACDF. Left arm was also quite swollen while in acute.  2.  Antithrombotics: -DVT/anticoagulation: Eliquis             -antiplatelet therapy: N/A 3. Pain Management: Lidoderm patch only.    -no narcs d/t delirium             Monitor with increased exertion 4. Mood: Provide emotional support             -antipsychotic agents: continue seroquel at 50mg  QHS   -continue sleep chart 5. Neuropsych: This patient is not fully capable of making decisions on his own behalf. Neuropsych eval today, appears encephalopathic , he misttook his daughter for wife during OT session 12/5 Neuropsych eval   -see above. Pt had baseline cognitive deficits  -12/156cognitive status waxes and wanes 6. Skin/Wound Care: Routine skin checks  -EPBC for buttocks 7. Fluids/Electrolytes/Nutrition:               CMP reviewed, albumin low, ongoing AKI  -added megace for appetite--eats 25-50%. Not a big eater per daughter  -continue protein supps  12/16 intake overall  improved---check BMET tomorrow    8.  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  AKI.  Improved.  Renal ultrasound no hydronephrosis.   10.  Hypotension.               Monitor with increased mobility. 11.  Atrial fibrillation.  Lopressor 50 mg twice daily, continue Eliquis.  Cardiac rate controlled.   controlled 12/9 12.  Acute on chronic anemia.                         Hgb trending up to 9.7 --10.2 on 12/11, will not order repeat given upward trend. 13.  COPD with remote history of tobacco abuse.  Continue nebulizers as  directed 14.  Hyperlipidemia.  Lipitor 15.  Prostate cancer with prostatectomy 1992.  Follow-up outpatient 16.  Colovesical fistula.    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  Follow-up urology as well as gastroenterology services.  Patient currently declined endoscopy. At this point, it doesn't appear that there is vesicular component to fistula/loose stool  -increased questran to bid  12/11:daughter concerned about discomfort of rectal tube and asked to have it removed. She is aware of the risks of skin breakdown and importance of regular cleaning.   12/12: feels more comfortable without rectal tube.   12/13 spoke with urology/GI. No imminent plans for f/u given that he's passing clear, uncontaminated urine and is not a candidate for surgery, further intervention.   12/15 plan at this time is to dc home with rectal tube or keeping patient as clean and as dry as possible given loose stool   -added EPBC for buttocks   -really nothing more to add to bulk up stool 17. Dysphagia: D3 diet, tolerating    LOS: 15 days A FACE TO FACE EVALUATION WAS PERFORMED  Meredith Staggers 06/14/2020, 11:00 AM

## 2020-06-14 NOTE — Progress Notes (Signed)
Patient ID: Carlos Olson, male   DOB: [birth year], 79 y.o.MRN: [id number]  SW returned phone call to pt dtr [name] [phone]) who reported preferred [facility] is 1) [facility], 2) [facility], and 3) [facility]. SW informed will explore if these agencies are able to accept. States she intends to begin to look for private sitters to be there with them 24/7 since her mother will need the assistance and she has to work.   SW sent HHPT/OT/SLP/Aide/RN referral to [facility]. SW waiting on follow-up. *SW spoke with Malachy Mood who reported pt policy is out of network and would have an $11,000 deductible with 60% co-pay.   SW sent referral to [facility] and SW waiting on follow-up.   SW ordered DME: 3in1 BSC, and w/c with [employer] [facility] via parachute.   Loralee Pacas, MSW, [place] Office: [phone] Cell: [phone] Fax: [phone]

## 2020-06-14 NOTE — Plan of Care (Signed)
  Problem: RH Dressing Goal: LTG Patient will perform upper body dressing (OT) Description: LTG Patient will perform upper body dressing with assist, with/without cues (OT). Flowsheets (Taken 06/14/2020 1548) LTG: Pt will perform upper body dressing with assistance level of: Minimal Assistance - Patient > 75% Note: Goal downgraded 12/16 due to lack of progress-ESD Goal: LTG Patient will perform lower body dressing w/assist (OT) Description: LTG: Patient will perform lower body dressing with assist, with/without cues in positioning using equipment (OT) Flowsheets (Taken 06/14/2020 1548) LTG: Pt will perform lower body dressing with assistance level of: Minimal Assistance - Patient > 75% Note: Goal downgraded 12/16 due to lack of progress-ESD   Problem: RH Functional Use of Upper Extremity Goal: LTG Patient will use RT/LT upper extremity as a (OT) Description: LTG: Patient will use right/left upper extremity as a stabilizer/gross assist/diminished/nondominant/dominant level with assist, with/without cues during functional activity (OT) Flowsheets (Taken 06/14/2020 1548) LTG: Use of upper extremity in functional activities: LUE as diminished level Note: Goal downgraded 12/16 due to lack of progress-ESD

## 2020-06-14 NOTE — Progress Notes (Signed)
Occupational Therapy Weekly Progress Note  Patient Details  Name: Carlos Olson MRN: [id number] Date of Birth: [birth year]  Beginning of progress report period: May 31, 2020 End of progress report period: June 14, 2020  Today's Date: 06/14/2020 OT Individual Time: 1420-1533 OT Individual Time Calculation (min): 73 min    Patient has met 3 of 3 short term goals.  Patient is making steady progress towards OT goals. He is able to transfer and ambulate short distances with RW and min A. Pt continues to need more assistance with BADLs 2/2 weakness, ataxia, and apraxia. Up to mod/max A to don shirt and pants pending fatigue. Continue current POC  Patient continues to demonstrate the following deficits: muscle weakness, decreased cardiorespiratoy endurance, impaired timing and sequencing, unbalanced muscle activation, motor apraxia, ataxia and decreased coordination, decreased attention to left, decreased initiation, decreased attention, decreased awareness, decreased problem solving, decreased safety awareness, decreased memory and delayed processing and decreased sitting balance, decreased standing balance, decreased postural control and decreased balance strategies and therefore will continue to benefit from skilled OT intervention to enhance overall performance with BADL and Reduce care partner burden.  Patient progressing toward long term goals..  Continue plan of care.  OT Short Term Goals Week 2:  OT Short Term Goal 1 (Week 2): Patient will maintain standing at the sink for 1 minute in prep for BADL tsk OT Short Term Goal 1 - Progress (Week 2): Met OT Short Term Goal 2 (Week 2): Patient will maintain standing balance within BADL task with no more than mod A OT Short Term Goal 2 - Progress (Week 2): Met OT Short Term Goal 3 (Week 2): Pt will perform UB dressing with Mod A OT Short Term Goal 3 - Progress (Week 2): Met Week 3:  OT Short Term Goal 1 (Week 3): LTG=STG 2/2  ELOS  Skilled Therapeutic Interventions/Progress Updates:    Patient greeted semi-reclined in bed and agreeable to shower today with encouragement. Pt completed bed mobility with CGA, HOB elevated, and railings. Pt needed 2 trials for sit<>stand and min A w/ RW. Pt ambulated into bathroom w/ RW and min A. He needed A for clothing management before sitting onto commode. Pt with continent watery BM, possible void of bladder as well. Worked on pt completing his own peri-care with set up A. Pt then ambulated short distance to transfer onto tub bench w/ RW and CGA. Bathing completed with focus on forced use of L UE, but difficulty maintain grasp on wash cloth-dropping wash cloth frequently. B UE task of washing hair and wringing out wash cloths. Dressing completed from wc at the sink with pt having difficulty orienting t shirt at first, then able to figure it out with min questioning cues. Pt fatigued quickly and had difficulty reaching LB needing OT assist to thread pant legs. Sit<>stand at the sink with Min A, then min A for balance when pulling up pants. Worked on functional use of L UE with theraputty activities. Pt returned to room and completed stand-pivot back to bed with min A. Pt left semi-reclined in bed with bed alarm on, call bell in reach, and needs met.   Therapy Documentation Precautions:  Precautions Precautions: Fall Precaution Comments: trach, flexiseal Restrictions Weight Bearing Restrictions: No Pain:   Denies pain  Therapy/Group: Individual Therapy  Valma Cava 06/14/2020, 3:18 PM

## 2020-06-14 NOTE — Progress Notes (Signed)
Physical Therapy Session Note  Patient Details  Name: Carlos Olson MRN: [id number] Date of Birth: 04-Jun-1941  Today's Date: 06/14/2020 PT Individual Time: 3893-7342 PT Individual Time Calculation (min): 68 min   Short Term Goals: Week 2:  PT Short Term Goal 1 (Week 2): Pt will perform bed mobility consistently with minA. PT Short Term Goal 2 (Week 2): Pt will perform sit to stand transfer consistenly with minA. PT Short Term Goal 3 (Week 2): Pt will ambulate 200' with RW and CGA.  Skilled Therapeutic Interventions/Progress Updates:    Pt received supine in bed and agreeable to therapy session. Supine>sitting L EOB, HOB partially elevated and using bedrails, with min assist for trunk upright - cuing for sequencing to encourage increased pt independence with him stating "you're going to make me do all of the work." Sitting EOB with supervision for trunk control, threaded B LEs into pants and donned shoes max assist for time management. Sit>stand EOB>RW with heavy min assist for balance on 1st try due to posterior lean and pt reporting some lightheadedness - returned to sitting and provided water and encouraged increased fluid intake. Sit>stand EOB>RW with light min assist this try and pulled pants over hips min assist. Gait training ~24ft to doorway using RW with CGA for safety, +2 w/c follow in event of fatigue - pt suddenly reports onset of lightheadedness again - provided w/c for seated break and assessed vitals: BP 128/58 (MAP 80), HR 103bpm, SpO2 97% Reassessed in standing: BP 106/65 (MAP 72), HR 116bpm, SpO2 94% with symptoms of lightheadedness indicating orthostatic hypotension. Pt suddenly reports having been incontinent of diarrhea. Transported in/out bathroom in w/c. Stand pivot w/c>BSC over toilet using grab bar support with CGA - total assist LB clothing management for cleanliness - unable to void further on toilet and RN made aware of pt's incontinent diarrhea. Standing with B UE support  on RW, total assist peri-care for cleanliness. L stand pivot BSC over toilet>w/c using RW with CGA for safety. Pt agreeable to continue therapy session. Gait training  ~175ft x2 to/from day room using RW with CGA for safety - demos slow gait speed with decreased B LE step length (though reciprocal) and increased trunk/hip flexion with downward gaze and increased B UE WBing into RW - cuing throughout for improved upright trunk posture. Assessed HR and SpO2 after 1st walk: 99% and 128bpm decreasing to 110bpm. Repeated sit<>stands to/from significantly elevated EOM without UE support 2x 5 reps and CGA for steadying. Once back in room pt requesting to return to bed due to fatigue. Sit>supine with CGA for trunk descent. Pt left supine in bed with needs in reach, HOB elevated, and bed alarm on.   Therapy Documentation Precautions:  Precautions Precautions: Fall Precaution Comments: trach, flexiseal Restrictions Weight Bearing Restrictions: No  Pain:   No reports of pain throughout session.   Therapy/Group: Individual Therapy  Tawana Scale , PT, DPT, CSRS  06/14/2020, 7:59 AM

## 2020-06-14 NOTE — Progress Notes (Signed)
Speech Language Pathology Daily Session Note  Patient Details  Name: [name] MRN: [id number] Date of Birth: 01/25/1941  Today's Date: 06/14/2020 SLP Individual Time: 0730-0825 SLP Individual Time Calculation (min): 55 min  Short Term Goals: Week 3: SLP Short Term Goal 1 (Week 3): STGS=LTGs due to ELOS  Skilled Therapeutic Interventions: Skilled treatment session focused on dysphagia and cognitive goals. SLP facilitated session by providing skilled observation with breakfast meal of regular textures with thin liquids. Patient consumed meal without overt s/s of aspiration and required supervision verbal cues for use of swallowing compensatory strategies. Recommend patient continue current diet. SLP also facilitated session by providing Mod A verbal cues for sustained attention and problem solving while developing a daily routine/schedule he can utilize at home. Patient required cues for generating activities that were both cognitively and physically appropriate at this time. Patient left upright in bed with alarm on and all needs within reach. Continue with current plan of care.      Pain No/Denies Pain   Therapy/Group: Individual Therapy  Jenai Scaletta 06/14/2020, 12:51 PM

## 2020-06-15 ENCOUNTER — Inpatient Hospital Stay (HOSPITAL_COMMUNITY): Payer: Medicare HMO

## 2020-06-15 ENCOUNTER — Inpatient Hospital Stay (HOSPITAL_COMMUNITY): Payer: Medicare HMO | Admitting: Occupational Therapy

## 2020-06-15 LAB — BASIC METABOLIC PANEL
Anion gap: 10 (ref 5–15)
BUN: 46 mg/dL — ABNORMAL HIGH (ref 8–23)
CO2: 15 mmol/L — ABNORMAL LOW (ref 22–32)
Calcium: 9.5 mg/dL (ref 8.9–10.3)
Chloride: 111 mmol/L (ref 98–111)
Creatinine, Ser: 1.33 mg/dL — ABNORMAL HIGH (ref 0.61–1.24)
GFR, Estimated: 54 mL/min — ABNORMAL LOW (ref >60)
Glucose, Bld: 104 mg/dL — ABNORMAL HIGH (ref 70–99)
Potassium: 4 mmol/L (ref 3.5–5.1)
Sodium: 136 mmol/L (ref 135–145)

## 2020-06-15 LAB — CBC
HCT: 34.3 % — ABNORMAL LOW (ref 39.0–52.0)
Hemoglobin: 10.7 g/dL — ABNORMAL LOW (ref 13.0–17.0)
MCH: 29.6 pg (ref 26.0–34.0)
MCHC: 31.2 g/dL (ref 30.0–36.0)
MCV: 94.8 fL (ref 80.0–100.0)
Platelets: 326 10*3/uL (ref 150–400)
RBC: 3.62 MIL/uL — ABNORMAL LOW (ref 4.22–5.81)
RDW: 15.6 % — ABNORMAL HIGH (ref 11.5–15.5)
WBC: 8.8 10*3/uL (ref 4.0–10.5)
nRBC: 0 % (ref 0.0–0.2)

## 2020-06-15 MED ORDER — SODIUM CHLORIDE 0.45 % IV SOLN: INTRAVENOUS | Status: DC

## 2020-06-15 NOTE — Progress Notes (Signed)
Physical Therapy Weekly Progress Note  Patient Details  Name: Carlos Olson MRN: [id number] Date of Birth: 1940-11-11  Beginning of progress report period: June 07, 2020 End of progress report period: June 15, 2020  Today's Date: 06/15/2020 PT Individual Time: 0802-0859 PT Individual Time Calculation (min): 57 min   Patient has met 2 of 3 short term goals.  Pt is progressing toward mobility goals with improved independence with bed mobility and transfers, performing consistently at minA level. Pt independence and distance ambulating fluctuates depending on fatigue levels. At times pt able to ambulate ~150' with RW and CGA but at other times pt only able to ambulate household distances with minA and appears much more fatigued. Pt will continue to benefit from PT and focus of coming week to be ongoing strength and endurance training and preparation for DC.  Patient continues to demonstrate the following deficits muscle weakness, decreased cardiorespiratoy endurance, decreased attention, decreased awareness, decreased problem solving, decreased safety awareness and decreased memory and decreased sitting balance, decreased standing balance, decreased postural control and decreased balance strategies and therefore will continue to benefit from skilled PT intervention to increase functional independence with mobility.  Patient progressing toward long term goals..  Continue plan of care.  PT Short Term Goals Week 2:  PT Short Term Goal 1 (Week 2): Pt will perform bed mobility consistently with minA. PT Short Term Goal 1 - Progress (Week 2): Met PT Short Term Goal 2 (Week 2): Pt will perform sit to stand transfer consistenly with minA. PT Short Term Goal 2 - Progress (Week 2): Met PT Short Term Goal 3 (Week 2): Pt will ambulate 200' with RW and CGA. PT Short Term Goal 3 - Progress (Week 2): Progressing toward goal Week 3:  PT Short Term Goal 1 (Week 3): STGs = LTGs  Skilled Therapeutic  Interventions/Progress Updates:  Ambulation/gait training;DME/adaptive equipment instruction;Balance/vestibular training;Cognitive remediation/compensation;Functional mobility training;Patient/family education;Therapeutic Exercise;Therapeutic Activities;Discharge planning;Neuromuscular re-education;Stair training;UE/LE Coordination activities;UE/LE Strength taining/ROM;Wheelchair propulsion/positioning   Pt received supine in bed asleep. Easily roused and agreeable to therapy. No complaint of pain. Bed mobility with minA. Sit to stand with CGA from elevated bed and stand step transfer to [facility] with CGA. WC transport to therapy gym for time management. Pt performs gait training in parallel bars, with focus on hip abductor strengthening. Pt performs multiple reps of sit to stand in parallel bars with CGA, and then performs side stepping facing mirror for visual feedback. Pt able to complete max of 25' prior to needing rest break. Pt performs multiple bouts. PT then adds level 1 theraband around pt's ankles to increase resistance and load on hip abductors. Pt completes x2 bouts of x10' with extended seated rest break.  Pt performs stand step transfer to toilet with CGA. PT provides minA to remove pants and brief. Pt partially continent of bowel in toilet. PT assists with pericare. Stand step transfer back to [facility] with CGA. Left seated with alarm intact and all needs within reach.  Therapy Documentation Precautions:  Precautions Precautions: Fall Precaution Comments: trach, flexiseal Restrictions Weight Bearing Restrictions: No   Therapy/Group: Individual Therapy  Breck Coons, PT, DPT 06/15/2020, 4:21 PM

## 2020-06-15 NOTE — Progress Notes (Signed)
[facility] PHYSICAL MEDICINE & REHABILITATION PROGRESS NOTE   Subjective/Complaints: No problems overnight. Rested pretty well  ROS: Limited due to cognitive/behavioral  .    Objective:   No results found. Recent Labs    06/15/20 0521  WBC 8.8  HGB 10.7*  HCT 34.3*  PLT 326   Recent Labs    06/15/20 0521  NA 136  K 4.0  CL 111  CO2 15*  GLUCOSE 104*  BUN 46*  CREATININE 1.33*  CALCIUM 9.5    Intake/Output Summary (Last 24 hours) at 06/15/2020 1242 Last data filed at 06/15/2020 8185 Gross per 24 hour  Intake 318 ml  Output --  Net 318 ml        Physical Exam: Vital Signs Blood pressure 121/65, pulse 69, temperature (!) 97.5 F (36.4 C), temperature source Oral, resp. rate 17, height 6\' 3"  (1.905 m), weight 81.7 kg, SpO2 94 %. Constitutional: No distress . Vital signs reviewed. HEENT: EOMI, oral membranes moist Neck: supple Cardiovascular: RRR without murmur. No JVD    Respiratory/Chest: CTA Bilaterally without wheezes or rales. Normal effort    GI/Abdomen: BS +, non-tender, non-distended Ext: no clubbing, cyanosis, or edema Psych: pleasant , tangential. Skin: Buttock redness.  Trach stoma closed with scab Musculoskeletal: Full ROM, No pain with AROM or PROM in the neck, trunk, or extremities. Neuro: alert, oriented to person, hospital, why he's here.    [place] 4/5 BLE, sensation intact, tone is normal, LUE 3-4/5--stable     Assessment/Plan: 1. Functional deficits which require 3+ hours per day of interdisciplinary therapy in a comprehensive inpatient rehab setting.  Physiatrist is providing close team supervision and 24 hour management of active medical problems listed below.  Physiatrist and rehab team continue to assess barriers to discharge/monitor patient progress toward functional and medical goals  Care Tool:  Bathing  Bathing activity did not occur: Safety/medical concerns Body parts bathed by patient: Left arm,Right  arm,Chest,Abdomen,Face,Front perineal area,Right upper leg,Left upper leg   Body parts bathed by helper: Right lower leg,Left lower leg,Buttocks     Bathing assist Assist Level: Moderate Assistance - Patient 50 - 74%     Upper Body Dressing/Undressing Upper body dressing   What is the patient wearing?: Pull over shirt    Upper body assist Assist Level: Minimal Assistance - Patient > 75%    Lower Body Dressing/Undressing Lower body dressing      What is the patient wearing?: Pants,Incontinence brief     Lower body assist Assist for lower body dressing: Maximal Assistance - Patient 25 - 49%     Toileting Toileting Toileting Activity did not occur (Clothing management and hygiene only): N/A (no void or bm) (patient has a rectal tube in- no void)  Toileting assist Assist for toileting: Maximal Assistance - Patient 25 - 49%     Transfers Chair/bed transfer  Transfers assist  Chair/bed transfer activity did not occur: Safety/medical concerns  Chair/bed transfer assist level: Minimal Assistance - Patient > 75% Chair/bed transfer assistive device: Programmer, multimedia   Ambulation assist      Assist level: Contact Guard/Touching assist Assistive device: Walker-rolling Max distance: 143ft   Walk 10 feet activity   Assist  Walk 10 feet activity did not occur: Safety/medical concerns  Assist level: Contact Guard/Touching assist Assistive device: Walker-rolling   Walk 50 feet activity   Assist Walk 50 feet with 2 turns activity did not occur: Safety/medical concerns  Assist level: Contact Guard/Touching assist Assistive device: Walker-rolling  Walk 150 feet activity   Assist Walk 150 feet activity did not occur: Safety/medical concerns  Assist level: Minimal Assistance - Patient > 75% Assistive device: Walker-rolling    Walk 10 feet on uneven surface  activity   Assist Walk 10 feet on uneven surfaces activity did not occur:  Safety/medical concerns         Wheelchair     Assist Will patient use wheelchair at discharge?: Yes Type of Wheelchair: Manual    Wheelchair assist level: Minimal Assistance - Patient > 75% Max wheelchair distance: 20'    Wheelchair 50 feet with 2 turns activity    Assist        Assist Level: Maximal Assistance - Patient 25 - 49%   Wheelchair 150 feet activity     Assist      Assist Level: Total Assistance - Patient < 25%   Blood pressure 121/65, pulse 69, temperature (!) 97.5 F (36.4 C), temperature source Oral, resp. rate 17, height 6\' 3"  (1.905 m), weight 81.7 kg, SpO2 94 %.  Medical Problem List and Plan: 1.  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 His most recent CT demonstrates diffuse atrophy. He may have been a little hypoxic at some point peri-operatively.  Daughter also told me that he had mild left arm weakness d/t neck/ACDF. Left arm was also quite swollen while in acute.  2.  Antithrombotics: -DVT/anticoagulation: Eliquis             -antiplatelet therapy: N/A 3. Pain Management: Lidoderm patch only.    -no narcs d/t delirium             Monitor with increased exertion 4. Mood: Provide emotional support             -antipsychotic agents: continue seroquel at 50mg  QHS    -continue sleep chart 5. Neuropsych: This patient is not fully capable of making decisions on his own behalf. Neuropsych eval today, appears encephalopathic , he misttook his daughter for wife during OT session 12/5 Neuropsych eval   -see above. Pt had baseline cognitive deficits  -12/156cognitive status waxes and wanes 6. Skin/Wound Care: Routine skin checks  -EPBC for buttocks 7.  Fluids/Electrolytes/Nutrition:               CMP reviewed, albumin low, ongoing AKI  -added megace for appetite--eats 25-50%. Not a big eater per daughter  -continue protein supps  12/16-17 intake overall improved   -unfortunately, BUN is elevated today 46   -encourage PO fluids   -add HS IVF to supplement for weekend, recheck BMET Monday morning    8.  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  AKI.  Improved.  Renal ultrasound no hydronephrosis.   10.  Hypotension.               Monitor with increased mobility. 11.  Atrial fibrillation.  Lopressor 50 mg twice daily, continue Eliquis.  Cardiac rate controlled.   controlled 12/9 12.  Acute on chronic anemia.                         Hgb trending up to 9.7 --10.2 on 12/11   -recheck Monday  13.  COPD with remote history of tobacco abuse.  Continue nebulizers as directed 14.  Hyperlipidemia.  Lipitor 15.  Prostate cancer with prostatectomy 1992.  Follow-up outpatient 16.  Colovesical fistula.    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  Follow-up urology as well as gastroenterology services.  Patient currently declined endoscopy. At this point, it doesn't appear that there is vesicular component to fistula/loose stool  -increased questran to bid, continue bid fiber  12/11:daughter concerned about discomfort of rectal tube and asked to have it removed. She is aware of the risks of skin breakdown and importance of regular cleaning.   12/12: feels more comfortable without rectal tube.   12/13 spoke with urology/GI. No imminent plans for f/u given that he's passing clear, uncontaminated urine and is not a candidate for surgery, further intervention.   12/17 plan at this time is to dc home with rectal tube or keeping patient as clean and as dry as possible given loose stool   -added EPBC for buttocks   -actually had 2 formed stools in a row  today!  17. Dysphagia: D3 diet, tolerating    LOS: 16 days A FACE TO FACE EVALUATION WAS PERFORMED  Meredith Staggers 06/15/2020, 12:42 PM

## 2020-06-15 NOTE — Progress Notes (Signed)
Occupational Therapy Session Note  Patient Details  Name: Carlos Olson MRN: [id number] Date of Birth: 27-Aug-1940  Today's Date: 06/15/2020 OT Individual Time: 1304-1400 OT Individual Time Calculation (min): 56 min   Short Term Goals: Week 3:  OT Short Term Goal 1 (Week 3): LTG=STG 2/2 ELOS  Skilled Therapeutic Interventions/Progress Updates:    Patient greeted semi-reclined in bed with daughter [name] present and agreeable to OT treatment session. Pt's daughter had questions regarding metabolic encephalopathy, referred questions to PA. Pt slower to initiate today, likely due to poor sleep last night. Bed mobility with increased time to initiate and min A overall. Sit<>stand at EOB with min A and RW, then min A to ambulate to bathroom. Pt had already been incontinent of BM in brief. Pt able to go more once on commode. Pt declined full bath, but wanted to change clothes. Worked on LB dressing while seated on BSC over toilet. Pt with difficulty reaching forward initially, requiring OT assist to don pants. Pt was able to stand and was peri-area with set-up and min A for balance. Worked on standing balance/endurance with standing hand washing task. Pt then brought to the gym and worked on problem solving and visospatial awareness with clock drawings. Pt initally with difficulty numbering L side, but was able to get after drawing 2 clocks. Pt also able to set clocks to correct time with min cues. OT added .5 lb hand weight to L hand and worked on fine motor control of picking up lagre pegs. Pt seemed to have more control with proprioceptive input from weights. Pt returned to room and left seated in wc with alarm belt on, call bell in reach, and needs met.    Therapy Documentation Precautions:  Precautions Precautions: Fall Precaution Comments: trach, flexiseal Restrictions Weight Bearing Restrictions: No Pain: Pain Assessment Pain Scale: 0-10 Pain Score: 0-No pain   Therapy/Group: Individual  Therapy  Valma Cava 06/15/2020, 2:06 PM

## 2020-06-15 NOTE — Progress Notes (Signed)
Speech Language Pathology Daily Session Note  Patient Details  Name: Carlos Olson MRN: [id number] Date of Birth: [birth year]  Today's Date: 06/15/2020 SLP Individual Time: 1001-1059 SLP Individual Time Calculation (min): 58 min  Short Term Goals: Week 3: SLP Short Term Goal 1 (Week 3): STGS=LTGs due to ELOS  Skilled Therapeutic Interventions: Skilled treatment session focused on cognitive goals, pt reports being full from eating large breakfast and denying regular trials at this time. SLP facilitated session by providing mod A verbal cues to increase topic maintenance and sustained attention to organize an ongoing daily routine/schedule at home. Pt continues to demonstrate decreased insight in cognitive and physical impairments at this time. When discussing shower routine at home, pt reports he will either stand or utilize "folding chair". After further discussion, pt recalls recent shower with therapy taking "an hour and 10 minutes" and acknowledges need for adaptive equipment. Pt left in bed with alarm on and all needs within reach. Cont ST POC.   Pain Pain Assessment Pain Scale: 0-10 Pain Score: 0-No pain  Therapy/Group: Individual Therapy  Dewaine Conger 06/15/2020, 10:58 AM

## 2020-06-16 ENCOUNTER — Ambulatory Visit (HOSPITAL_COMMUNITY): Payer: Medicare HMO | Admitting: Physical Therapy

## 2020-06-16 ENCOUNTER — Encounter (HOSPITAL_COMMUNITY): Payer: Medicare HMO

## 2020-06-16 ENCOUNTER — Encounter (HOSPITAL_COMMUNITY): Payer: Medicare HMO | Admitting: Speech Pathology

## 2020-06-16 MED ORDER — FLUTICASONE PROPIONATE 50 MCG/ACT NA SUSP
2.0000 | Freq: Every day | NASAL | Status: DC
  Administered 2020-06-16 – 2020-06-20 (×5): 2 via NASAL
  Filled 2020-06-16: qty 16

## 2020-06-16 MED ORDER — BENZONATATE 100 MG PO CAPS
100.0000 mg | ORAL_CAPSULE | Freq: Three times a day (TID) | ORAL | Status: AC
  Administered 2020-06-16 – 2020-06-19 (×9): 100 mg via ORAL
  Filled 2020-06-16 (×9): qty 1

## 2020-06-16 NOTE — Plan of Care (Signed)
  Problem: Consults Goal: RH GENERAL PATIENT EDUCATION Description: See Patient Education module for education specifics. Outcome: Progressing   Problem: RH BOWEL ELIMINATION Goal: RH STG MANAGE BOWEL WITH ASSISTANCE Description: STG Manage Bowel with min Assistance. Outcome: Progressing Goal: RH STG MANAGE BOWEL W/MEDICATION W/ASSISTANCE Description: STG Manage Bowel with Medication with min Assistance. Outcome: Progressing   Problem: RH BLADDER ELIMINATION Goal: RH STG MANAGE BLADDER WITH ASSISTANCE Description: STG Manage Bladder With min Assistance Outcome: Progressing Goal: RH STG MANAGE BLADDER WITH MEDICATION WITH ASSISTANCE Description: STG Manage Bladder With Medication With min Assistance. Outcome: Progressing   Problem: RH SKIN INTEGRITY Goal: RH STG SKIN FREE OF INFECTION/BREAKDOWN Description: Skin to remain free of breakdown while on rehab with min assistance. Outcome: Progressing Goal: RH STG ABLE TO PERFORM INCISION/WOUND CARE W/ASSISTANCE Description: STG Able To Perform Incision/Wound Care With min Assistance. Outcome: Progressing   Problem: RH SAFETY Goal: RH STG ADHERE TO SAFETY PRECAUTIONS W/ASSISTANCE/DEVICE Description: STG Adhere to Safety Precautions With min Assistance/Device. Outcome: Progressing   Problem: RH PAIN MANAGEMENT Goal: RH STG PAIN MANAGED AT OR BELOW PT'S PAIN GOAL Description: <3 on a 0-10 pain scale. Outcome: Progressing   Problem: RH KNOWLEDGE DEFICIT GENERAL Goal: RH STG INCREASE KNOWLEDGE OF SELF CARE AFTER HOSPITALIZATION Description: Patient will be able to demonstrate knowledge of medication manage, dietary restrictions, safety precautions, with handouts, education materials and min assist from staff. Outcome: Progressing

## 2020-06-16 NOTE — Progress Notes (Signed)
Speech Language Pathology Daily Session Note  Patient Details  Name: Carlos Olson MRN: [id number] Date of Birth: April 19, 1941  Today's Date: 06/16/2020 SLP Individual Time: 1100-1145 SLP Individual Time Calculation (min): 45 min  Short Term Goals: Week 3: SLP Short Term Goal 1 (Week 3): STGS=LTGs due to ELOS  Skilled Therapeutic Interventions:   Patient seen with daughter Collie Siad) present in room for skilled ST focusing on cognitive-linguistic goals and family education. Patient's daughter asked SLP for suggestions of things to work on with patient when he discharges home. She is understanding of need for supervision as well as managing medication, financial, etc for patient. She stated that she has been trying to find a CNA but as there has been a shortage, she is unable. She was also concerned as patient is 26ft 4 inch and his wife is little over 48ft so she would not be able to assist him with physical needs. Patient was pleasant, unable to correctly tell name of his daughter who was present and unable to give his street address. SLP led patient in completing a few items from [place] [employer] Oral Reading Test). He read short paragraphs and then answered multiple choice questions. Accuracy overall was 30% without assistance. He required mod-max cues to problem solve though responding to basic level comprehension questions and would often become tangential when responding. Patient appears to use some humor to mask his deficits, however likely this is subconsciously because he does not appear to be aware of his cognitive deficits. Patient continues to benefit from skilled SLP intervention to maximize cognitive-linguistic abilities prior to discharge.  Pain Pain Assessment Pain Scale: 0-10 Pain Score: 0-No pain  Therapy/Group: Individual Therapy  Sonia Baller, MA, CCC-SLP Speech Therapy

## 2020-06-16 NOTE — Progress Notes (Signed)
Occupational Therapy Session Note ° °Patient Details  °Name: Carlos Olson °MRN: [id number] °Date of Birth: 02/13/1941 ° °Today's Date: 06/16/2020 °OT Individual Time: 1000-1045 °OT Individual Time Calculation (min): 45 min  ° ° °Short Term Goals: °Week 1:  OT Short Term Goal 1 (Week 1): Patient will complete toilet transfer with max A of 1 °OT Short Term Goal 1 - Progress (Week 1): Met °OT Short Term Goal 2 (Week 1): Patient will complete 1 step of LB dressing task °OT Short Term Goal 2 - Progress (Week 1): Met °OT Short Term Goal 3 (Week 1): Pt will maintain sitting balance at EOB with no more than mod A within BADL task °OT Short Term Goal 3 - Progress (Week 1): Met °Week 2:  OT Short Term Goal 1 (Week 2): Patient will maintain standing at the sink for 1 minute in prep for BADL tsk °OT Short Term Goal 1 - Progress (Week 2): Met °OT Short Term Goal 2 (Week 2): Patient will maintain standing balance within BADL task with no more than mod A °OT Short Term Goal 2 - Progress (Week 2): Met °OT Short Term Goal 3 (Week 2): Pt will perform UB dressing with Mod A °OT Short Term Goal 3 - Progress (Week 2): Met ° °Skilled Therapeutic Interventions/Progress Updates:  °  1:1. Pt received in bed agreeable to OT. Pt requires MIN A to don shirt and MOD A to don pants at EOB with increased time for pt to problem solve/pull pants from ankles to knees. A to advance pants past hips. Pt reporting use shoe horn at baseline. Provided LHSH for pt and able to don shoes. Pts wife not present for family ed d/t sore throat and not wanting to give to pt. Pt daughter present and discussed shower set up. Pt completes ambulatory transfer with OT and RW to bathroom with CGA and requries A for clothing mangement prior to and after toileting. Pt able ot complete posterior hygiene after loose bloody stool (med staff aware). Pt returned to bed, exit alarm on call light in reach as OT exits ° °Therapy Documentation °Precautions:   °Precautions °Precautions: Fall °Precaution Comments: trach, flexiseal °Restrictions °Weight Bearing Restrictions: No °General: °  °Vital Signs: °Therapy Vitals °Pulse Rate: 75 °Resp: 20 °BP: 122/63 °Patient Position (if appropriate): Lying °Oxygen Therapy °SpO2: 95 % °O2 Device: Room Air °Pain: °  °ADL: °ADL °Eating: Unable to assess °Grooming: Setup °Where Assessed-Grooming: Sitting at sink °Upper Body Bathing: Supervision/safety °Where Assessed-Upper Body Bathing: Sitting at sink °Lower Body Bathing: Moderate assistance °Where Assessed-Lower Body Bathing: Sitting at sink,Standing at sink °Upper Body Dressing: Minimal assistance °Where Assessed-Upper Body Dressing: Sitting at sink °Lower Body Dressing: Maximal assistance °Where Assessed-Lower Body Dressing: Sitting at sink,Standing at sink °Toileting: Unable to assess °Toilet Transfer: Minimal assistance °Toilet Transfer Method: Ambulating °Toilet Transfer Equipment: Raised toilet seat °Tub/Shower Transfer: Unable to assess °Vision °  °Perception  °  °Praxis °  °Exercises: °  °Other Treatments:   ° ° °Therapy/Group: Individual Therapy ° ° M  °06/16/2020, 6:45 AM ° °

## 2020-06-16 NOTE — Progress Notes (Signed)
PHYSICAL MEDICINE & REHABILITATION PROGRESS NOTE   Subjective/Complaints:  Pt reports getting family training done today- pt was mean to daughter yesterday because she tried to keep him awake- she does report pt awake for therapy, but then exhausted and sleeping a lot in between.   LBM this Am in bed.  Runny nose since Wednesday and daughter worried he's getting his wife's Symptoms.  Throat NOT sore.  Rare/occ cough per pt/daughter.   Also wants him to be offered tylenol more Will change order to encourage nursing to offer.   ROS: limited due to cognition/sedation  Objective:   No results found. Recent Labs    06/15/20 0521  WBC 8.8  HGB 10.7*  HCT 34.3*  PLT 326   Recent Labs    06/15/20 0521  NA 136  K 4.0  CL 111  CO2 15*  GLUCOSE 104*  BUN 46*  CREATININE 1.33*  CALCIUM 9.5    Intake/Output Summary (Last 24 hours) at 06/16/2020 1735 Last data filed at 06/16/2020 1610 Gross per 24 hour  Intake 1212.93 ml  Output --  Net 1212.93 ml        Physical Exam: Vital Signs Blood pressure 109/69, pulse 66, temperature 98.2 F (36.8 C), temperature source Oral, resp. rate 20, height 6\' 3"  (1.905 m), weight 81.7 kg, SpO2 96 %. Constitutional: sitting up in bed- fell asleep during interview with daughter x2- sleepy, NAD HEENT: EOMI, oral membranes moist- no cough- oropharynx slightly red- not bad Neck: supple Cardiovascular: RRR    Respiratory/Chest: CTA B/L- no W/R/R- good air movement GI/Abdomen: Soft, NT, ND, (+)BS  Ext: no clubbing, cyanosis, or edema Psych: pleasant , tangential- lethargic. Skin: Buttock redness.  Trach stoma closed with scab Musculoskeletal: Full ROM, No pain with AROM or PROM in the neck, trunk, or extremities. Neuro: alert, oriented to person, hospital, why he's here.    [place] 4/5 BLE, sensation intact, tone is normal, LUE 3-4/5--stable     Assessment/Plan: 1. Functional deficits which require 3+ hours per day of  interdisciplinary therapy in a comprehensive inpatient rehab setting.  Physiatrist is providing close team supervision and 24 hour management of active medical problems listed below.  Physiatrist and rehab team continue to assess barriers to discharge/monitor patient progress toward functional and medical goals  Care Tool:  Bathing  Bathing activity did not occur: Safety/medical concerns Body parts bathed by patient: Left arm,Right arm,Chest,Abdomen,Face,Front perineal area,Right upper leg,Left upper leg   Body parts bathed by helper: Right lower leg,Left lower leg,Buttocks     Bathing assist Assist Level: Moderate Assistance - Patient 50 - 74%     Upper Body Dressing/Undressing Upper body dressing   What is the patient wearing?: Pull over shirt    Upper body assist Assist Level: Minimal Assistance - Patient > 75%    Lower Body Dressing/Undressing Lower body dressing      What is the patient wearing?: Pants,Incontinence brief     Lower body assist Assist for lower body dressing: Maximal Assistance - Patient 25 - 49%     Toileting Toileting Toileting Activity did not occur (Clothing management and hygiene only): N/A (no void or bm) (patient has a rectal tube in- no void)  Toileting assist Assist for toileting: Maximal Assistance - Patient 25 - 49%     Transfers Chair/bed transfer  Transfers assist  Chair/bed transfer activity did not occur: Safety/medical concerns  Chair/bed transfer assist level: Contact Guard/Touching assist Chair/bed transfer assistive device: Programmer, multimedia  Ambulation assist      Assist level: Contact Guard/Touching assist Assistive device: Walker-rolling Max distance: 25'   Walk 10 feet activity   Assist  Walk 10 feet activity did not occur: Safety/medical concerns  Assist level: Contact Guard/Touching assist Assistive device: Parallel bars   Walk 50 feet activity   Assist Walk 50 feet with 2 turns  activity did not occur: Safety/medical concerns  Assist level: Contact Guard/Touching assist Assistive device: Walker-rolling    Walk 150 feet activity   Assist Walk 150 feet activity did not occur: Safety/medical concerns  Assist level: Minimal Assistance - Patient > 75% Assistive device: Walker-rolling    Walk 10 feet on uneven surface  activity   Assist Walk 10 feet on uneven surfaces activity did not occur: Safety/medical concerns         Wheelchair     Assist Will patient use wheelchair at discharge?: Yes Type of Wheelchair: Manual    Wheelchair assist level: Minimal Assistance - Patient > 75% Max wheelchair distance: 20'    Wheelchair 50 feet with 2 turns activity    Assist        Assist Level: Maximal Assistance - Patient 25 - 49%   Wheelchair 150 feet activity     Assist      Assist Level: Total Assistance - Patient < 25%   Blood pressure 109/69, pulse 66, temperature 98.2 F (36.8 C), temperature source Oral, resp. rate 20, height 6\' 3"  (1.905 m), weight 81.7 kg, SpO2 96 %.  Medical Problem List and Plan: 1.  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 His most recent CT demonstrates diffuse atrophy. He may have been a little hypoxic at some point peri-operatively.  Daughter also told me that he had mild left arm weakness d/t neck/ACDF. Left arm was also quite swollen while in acute.  2.  Antithrombotics: -DVT/anticoagulation: Eliquis             -antiplatelet therapy: N/A 3. Pain Management: Lidoderm patch only.    -no narcs d/t delirium  12/18- will offer tylenol prn when needed             Monitor with increased exertion 4. Mood: Provide emotional support              -antipsychotic agents: continue seroquel at 50mg  QHS    -continue sleep chart  12/18- slept well per sleep chart 5. Neuropsych: This patient is not fully capable of making decisions on his own behalf. Neuropsych eval today, appears encephalopathic , he misttook his daughter for wife during OT session 12/5 Neuropsych eval   -see above. Pt had baseline cognitive deficits  -12/156cognitive status waxes and wanes 6. Skin/Wound Care: Routine skin checks  -EPBC for buttocks 7. Fluids/Electrolytes/Nutrition:               CMP reviewed, albumin low, ongoing AKI  -added megace for appetite--eats 25-50%. Not a big eater per daughter  -continue protein supps  12/16-17 intake overall improved   -unfortunately, BUN is elevated today 46   -encourage PO fluids   -add HS IVF to supplement for weekend, recheck  BMET Monday morning    8.  Tracheostomy   12/9 trach dc'ed 12/8-stoma closing  -intermittent cough    -added mucinex   -continue nebs   -cough mostly in early AM   -improved 12/17  12/18- ordered tessalon prn for cough 9.  AKI.  Improved.  Renal ultrasound no hydronephrosis.   10.  Hypotension.               Monitor with increased mobility. 11.  Atrial fibrillation.  Lopressor 50 mg twice daily, continue Eliquis.  Cardiac rate controlled.   controlled 12/9 12.  Acute on chronic anemia.                         Hgb trending up to 9.7 --10.2 on 12/11   -recheck Monday  13.  COPD with remote history of tobacco abuse.  Continue nebulizers as directed 14.  Hyperlipidemia.  Lipitor 15.  Prostate cancer with prostatectomy 1992.  Follow-up outpatient 16.  Colovesical fistula.    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  Follow-up urology as well as gastroenterology services.  Patient currently declined endoscopy. At this point, it doesn't appear that there is vesicular component to fistula/loose stool  -increased questran to bid, continue bid fiber  12/11:daughter concerned about discomfort of rectal tube and  asked to have it removed. She is aware of the risks of skin breakdown and importance of regular cleaning.   12/12: feels more comfortable without rectal tube.   12/13 spoke with urology/GI. No imminent plans for f/u given that he's passing clear, uncontaminated urine and is not a candidate for surgery, further intervention.   12/17 plan at this time is to dc home with rectal tube or keeping patient as clean and as dry as possible given loose stool   -added EPBC for buttocks   -actually had 2 formed stools in a row today!  17. Dysphagia: D3 diet, tolerating  18. Runny nose  12/18- ordered flonase daily  I spent a total of 30 minutes on total care today- >50% on coordination/care speaking to daughter about plan.   LOS: 17 days A FACE TO FACE EVALUATION WAS PERFORMED  Karon Cotterill 06/16/2020, 5:35 PM

## 2020-06-16 NOTE — Progress Notes (Signed)
Physical Therapy Session Note  Patient Details  Name: Carlos Olson MRN: [id number] Date of Birth: May 09, 1941  Today's Date: 06/16/2020 PT Individual Time: 1450-1545 PT Individual Time Calculation (min): 55 min   Short Term Goals:  Week 3:  PT Short Term Goal 1 (Week 3): STGs = LTGs  Skilled Therapeutic Interventions/Progress Updates:   Pt received supine in bed and agreeable to PT. Supine>sit transfer with min  assist and moderate cues for improved use of log roll technique to push in to sitting and reduce risk of causing LBP. Min cues for LE placement. Sitting balance EOB with mod assist progressing to supervision assist once pt performed adequate anterior weight shift. Pt's daughter present for family education. Cues to assist by supporting trunk rather than pulling arm to prevent posterior bias. Ambulatory transfer to [facility] with RW and supervision assist.   Pt transported to rehab gym in [facility]. Gait traing with CGA-supervision assist from daughter x 151f with only min cues for improved posture and step height on the RLE to prevent foot drag and reduce fall risk.  [place] transfer training x 2 with CGA from PT and then from daughter to sit on the Edge of seat set to tall SUV height to simulate access to 4runner. Pt unable to bring legs into car due to unrealistic floor to seat height of simulator at SUV position (33in) education provided to daughter for proper scooting technique once sitting on seat edge.   Pt returned to room and [facility] min assist for clothing management. Pt able to perform peri care following urination and small bloody BM. Pt then performed ambulatory transfer to bed with RW and supervision assist. Sit>supine completed with min assist for LE management. Pt  left supine in bed with call bell in reach and all needs met.         Therapy Documentation Precautions:  Precautions Precautions:  Fall Precaution Comments: trach, flexiseal Restrictions Weight Bearing Restrictions: No Vital Signs: Therapy Vitals Temp: 98.2 F (36.8 C) Temp Source: Oral Pulse Rate: 66 BP: 109/69 Patient Position (if appropriate): Lying Oxygen Therapy SpO2: 96 % O2 Device: Room Air Pain: Pain Assessment Pain Scale: 0-10 Pain Score: 0-No pain    Therapy/Group: Individual Therapy  ALorie Phenix12/18/2021, 3:47 PM

## 2020-06-17 ENCOUNTER — Encounter (HOSPITAL_COMMUNITY): Payer: Medicare HMO | Admitting: Occupational Therapy

## 2020-06-17 ENCOUNTER — Encounter (HOSPITAL_COMMUNITY): Payer: Medicare HMO | Admitting: Speech Pathology

## 2020-06-17 ENCOUNTER — Ambulatory Visit (HOSPITAL_COMMUNITY): Payer: Medicare HMO

## 2020-06-17 NOTE — Plan of Care (Signed)
  Problem: Consults Goal: RH GENERAL PATIENT EDUCATION Description: See Patient Education module for education specifics. Outcome: Progressing   Problem: RH BOWEL ELIMINATION Goal: RH STG MANAGE BOWEL WITH ASSISTANCE Description: STG Manage Bowel with min Assistance. Outcome: Progressing Goal: RH STG MANAGE BOWEL W/MEDICATION W/ASSISTANCE Description: STG Manage Bowel with Medication with min Assistance. Outcome: Progressing   Problem: RH BLADDER ELIMINATION Goal: RH STG MANAGE BLADDER WITH ASSISTANCE Description: STG Manage Bladder With min Assistance Outcome: Progressing Goal: RH STG MANAGE BLADDER WITH MEDICATION WITH ASSISTANCE Description: STG Manage Bladder With Medication With min Assistance. Outcome: Progressing   Problem: RH SKIN INTEGRITY Goal: RH STG SKIN FREE OF INFECTION/BREAKDOWN Description: Skin to remain free of breakdown while on rehab with min assistance. Outcome: Progressing Goal: RH STG ABLE TO PERFORM INCISION/WOUND CARE W/ASSISTANCE Description: STG Able To Perform Incision/Wound Care With min Assistance. Outcome: Progressing   Problem: RH SAFETY Goal: RH STG ADHERE TO SAFETY PRECAUTIONS W/ASSISTANCE/DEVICE Description: STG Adhere to Safety Precautions With min Assistance/Device. Outcome: Progressing   Problem: RH PAIN MANAGEMENT Goal: RH STG PAIN MANAGED AT OR BELOW PT'S PAIN GOAL Description: <3 on a 0-10 pain scale. Outcome: Progressing   Problem: RH KNOWLEDGE DEFICIT GENERAL Goal: RH STG INCREASE KNOWLEDGE OF SELF CARE AFTER HOSPITALIZATION Description: Patient will be able to demonstrate knowledge of medication manage, dietary restrictions, safety precautions, with handouts, education materials and min assist from staff. Outcome: Progressing

## 2020-06-17 NOTE — Progress Notes (Signed)
Physical Therapy Session Note  Patient Details  Name: Carlos Olson MRN: [id number] Date of Birth: May 15, 1941  Today's Date: 06/17/2020 PT Individual Time: 1015-1100 PT Individual Time Calculation (min): 45 min   Short Term Goals: Week 3:  PT Short Term Goal 1 (Week 3): STGs = LTGs  Skilled Therapeutic Interventions/Progress Updates:     Patient in bed with he son-in-law in the room upon PT arrival. Patient alert and agreeable to PT session. Patient reported mild-moderate back pain during session, RN made aware. PT provided repositioning, rest breaks, and distraction as pain interventions throughout session.   Patient son-in-law participated in hands on training and family education throughout session in preparation for d/c this week.  Therapeutic Activity: Bed Mobility: Patient incontinent of bowl at beginning of session. Patient's son-in-law stated he would be assisting with toileting at d/c and participated in assisting bed level peri-care and LB dressing with total A for the patient. Educated on thoroughness and hygiene, as well as skin care throughout. Patient performed rolling R/L with mod-min A with max cues for sequencing and technique. Patient performed supine to/from sit in a flat bed without use of bed rails with min A-CGA. Provided verbal cues for log roll technique to manage back pain and hand placement to use both hands to push up to sitting. Donned/doffed B tennis shoes with total A for time management with patient sitting EOB. Transfers: Patient performed sit to/from stand with mod A for forward weight shift and boosting up x1 then with min A from the bed x1 and BSC over the toilet x1 using RW. Provided verbal cues for scooting forward, foot placement, hand placement, forward weight shift, and hip/knee/trunk extension to come to standing. Patient with posterior lean in standing initially, able to self-correct on second trial. Patient becomes SOB in standing with increased sway  during first standing trial. BP 128/86, HR 77, SPO2 97%. Mild SOB on second trial without sway. Patient reported bowl incontinence in standing on second trial.  Patient with incontinent and continent of bowl on BSC over the toilet. Noted loose stool with bright red blood resulting from BM, RN made aware. Patient required total A for peri-care and LB clothing management. Performed sit to/from stand from [facility] with min-mod A using RW, provided cues for hand placement and forward weight shift.   Gait Training:  Patient ambulated ~12 feet to/from the bathroom using RW with min A-CGA. Ambulated with forward trunk flexion with posterior bias (weight in his heels), decreased gait speed, poor safety awareness with RW, increased work of breathing with mobility. Provided verbal cues for forward weight shift into his toes, safe use of RW, proximity to RW, and increased foot clearance.  Educated patient and his son-in-law on fall risk/prevention, home modifications to prevent falls, and activation of emergency services in the event of a fall during session. Also, discussed recommendation of use of RW at all times with mobility, w/c for energy conservation and safety with fluctuating performance, and discussed home set-up including level entry into the house form the garage and safety with car transfers. Patient and his son-in-law report that the w/c will fit through doors in the home. Patient and his son-in-law in agreement with education and recommendations throughout session.   Patient in bed with his son-in-law in the room at end of session with breaks locked, bed alarm set, and all needs within reach.    Therapy Documentation Precautions:  Precautions Precautions: Fall Precaution Comments: trach, flexiseal Restrictions Weight Bearing Restrictions: No  Therapy/Group: Individual Therapy  Carlus Stay L Ardice Boyan PT, DPT  06/17/2020, 12:58 PM

## 2020-06-17 NOTE — Progress Notes (Signed)
Occupational Therapy Session Note  Patient Details  Name: Carlos Olson MRN: [id number] Date of Birth: [birth year]  Today's Date: 06/17/2020 OT Individual Time: 4383-8184 OT Individual Time Calculation (min): 49 min    Short Term Goals: Week 2:  OT Short Term Goal 1 (Week 2): Patient will maintain standing at the sink for 1 minute in prep for BADL tsk OT Short Term Goal 1 - Progress (Week 2): Met OT Short Term Goal 2 (Week 2): Patient will maintain standing balance within BADL task with no more than mod A OT Short Term Goal 2 - Progress (Week 2): Met OT Short Term Goal 3 (Week 2): Pt will perform UB dressing with Mod A OT Short Term Goal 3 - Progress (Week 2): Met Week 3:  OT Short Term Goal 1 (Week 3): LTG=STG 2/2 ELOS  Skilled Therapeutic Interventions/Progress Updates:    Patient in bed, lunch tray at bed side.  Patient agreeable to getting OOB and sitting in w/c to eat lunch.  Son in law present for therapy session.  Supine to side lying with CS, increased time, side lying to sitting edge of bed with mod a.  He is able to donn slippers with mod a (attempted to use long handled shoe horn - required cues to use right side up)  Sit to stand and short distance ambulation with RW bed to w/c with min a.  He ate lunch with CS, cues for appropriate use of condiments (sugar on sandwich, lemon packet etc - became angry when questioned) - struggled to use left hand for holding and manipulating objects.   Reviewed use/set up of commode in home environment with son in law.  Discussed pros and cons of bedside use vs over toilet in bathroom.   Patient declined toileting, ambulation or other activities.  He refused to use the walker to ambulate back to bed - requiring min A of 2 to maintain balance and walk safely.  Reviewed use of gait belt with son in law who demonstrates good understanding.  Patient returned to bed, min a for sit to supine, bed alarm set and callbell in reach.  He missed 11 minutes of  session due to fatigue/refusal.    Therapy Documentation Precautions:  Precautions Precautions: Fall Precaution Comments: trach, flexiseal Restrictions Weight Bearing Restrictions: No   Therapy/Group: Individual Therapy  Carlos Levering 06/17/2020, 7:52 AM

## 2020-06-17 NOTE — Plan of Care (Signed)
°  Problem: Consults Goal: RH GENERAL PATIENT EDUCATION Description: See Patient Education module for education specifics. 06/17/2020 1604 by Renda Rolls L, LPN Outcome: Progressing 06/17/2020 1012 by Renda Rolls L, LPN Outcome: Progressing   Problem: RH BOWEL ELIMINATION Goal: RH STG MANAGE BOWEL WITH ASSISTANCE Description: STG Manage Bowel with min Assistance. 06/17/2020 1604 by Renda Rolls L, LPN Outcome: Progressing 06/17/2020 1012 by Renda Rolls L, LPN Outcome: Progressing Goal: RH STG MANAGE BOWEL W/MEDICATION W/ASSISTANCE Description: STG Manage Bowel with Medication with min Assistance. 06/17/2020 1604 by Renda Rolls L, LPN Outcome: Progressing 06/17/2020 1012 by Renda Rolls L, LPN Outcome: Progressing   Problem: RH BLADDER ELIMINATION Goal: RH STG MANAGE BLADDER WITH ASSISTANCE Description: STG Manage Bladder With min Assistance 06/17/2020 1604 by Renda Rolls L, LPN Outcome: Progressing 06/17/2020 1012 by Renda Rolls L, LPN Outcome: Progressing Goal: RH STG MANAGE BLADDER WITH MEDICATION WITH ASSISTANCE Description: STG Manage Bladder With Medication With min Assistance. 06/17/2020 1604 by Renda Rolls L, LPN Outcome: Progressing 06/17/2020 1012 by Renda Rolls L, LPN Outcome: Progressing   Problem: RH SKIN INTEGRITY Goal: RH STG SKIN FREE OF INFECTION/BREAKDOWN Description: Skin to remain free of breakdown while on rehab with min assistance. 06/17/2020 1604 by Renda Rolls L, LPN Outcome: Progressing 06/17/2020 1012 by Renda Rolls L, LPN Outcome: Progressing Goal: RH STG ABLE TO PERFORM INCISION/WOUND CARE W/ASSISTANCE Description: STG Able To Perform Incision/Wound Care With min Assistance. 06/17/2020 1604 by Renda Rolls L, LPN Outcome: Progressing 06/17/2020 1012 by Renda Rolls L, LPN Outcome: Progressing   Problem: RH SAFETY Goal: RH STG ADHERE TO SAFETY PRECAUTIONS W/ASSISTANCE/DEVICE Description: STG Adhere to Safety Precautions With min  Assistance/Device. 06/17/2020 1604 by Renda Rolls L, LPN Outcome: Progressing 06/17/2020 1012 by Renda Rolls L, LPN Outcome: Progressing   Problem: RH PAIN MANAGEMENT Goal: RH STG PAIN MANAGED AT OR BELOW PT'S PAIN GOAL Description: <3 on a 0-10 pain scale. 06/17/2020 1604 by Renda Rolls L, LPN Outcome: Progressing 06/17/2020 1012 by Renda Rolls L, LPN Outcome: Progressing   Problem: RH KNOWLEDGE DEFICIT GENERAL Goal: RH STG INCREASE KNOWLEDGE OF SELF CARE AFTER HOSPITALIZATION Description: Patient will be able to demonstrate knowledge of medication manage, dietary restrictions, safety precautions, with handouts, education materials and min assist from staff. 06/17/2020 1604 by Renda Rolls L, LPN Outcome: Progressing 06/17/2020 1012 by Renda Rolls L, LPN Outcome: Progressing

## 2020-06-17 NOTE — Progress Notes (Addendum)
Speech Language Pathology Daily Session Note  Patient Details  Name: Carlos Olson MRN: [id number] Date of Birth: December 22, 1940  Today's Date: 06/17/2020 SLP Individual Time: 1116-1200 SLP Individual Time Calculation (min): 44 min  Short Term Goals: Week 3: SLP Short Term Goal 1 (Week 3): STGS=LTGs due to ELOS  Skilled Therapeutic Interventions:  Pt was seen for skilled ST targeting family education prior to discharge this upcoming week.  Pt's son in law Ronalee Belts was present and remained actively engaged in all training tasks.  Pt's son in law reported noticing that pt continues to have ongoing periods of confusion and SLP discussed techniques to reorient and redirect pt throughout the day.  SLP also provided skilled education about how to modify activities that are personally meaningful to pt in order to allow family to address cognitive remediation/compensation in between therapy sessions in the home environment and maximize treatment effects.   Pt's family has been in attendance for the majority of his treatment sessions and they have participated in many opportunities for training.  As a result, pt's son in law had no additional questions today.  He reports that his wife/pt's daughter is planing to attend scheduled training opportunity tomorrow.  Pt was left in bed with family at bedside.  Continue per current plan of care.    Pain Pain Assessment Pain Scale: 0-10 Pain Score: 0-No pain  Therapy/Group: Individual Therapy  Janeann Paisley, Selinda Orion 06/17/2020, 12:46 PM

## 2020-06-18 ENCOUNTER — Encounter (HOSPITAL_COMMUNITY): Payer: Medicare HMO | Admitting: Occupational Therapy

## 2020-06-18 ENCOUNTER — Ambulatory Visit (HOSPITAL_COMMUNITY): Payer: Medicare HMO

## 2020-06-18 ENCOUNTER — Inpatient Hospital Stay (HOSPITAL_COMMUNITY): Payer: Medicare HMO | Admitting: Occupational Therapy

## 2020-06-18 ENCOUNTER — Inpatient Hospital Stay (HOSPITAL_COMMUNITY): Payer: Medicare HMO | Admitting: Speech Pathology

## 2020-06-18 LAB — CBC
HCT: 33.1 % — ABNORMAL LOW (ref 39.0–52.0)
Hemoglobin: 10.2 g/dL — ABNORMAL LOW (ref 13.0–17.0)
MCH: 29.7 pg (ref 26.0–34.0)
MCHC: 30.8 g/dL (ref 30.0–36.0)
MCV: 96.5 fL (ref 80.0–100.0)
Platelets: 284 10*3/uL (ref 150–400)
RBC: 3.43 MIL/uL — ABNORMAL LOW (ref 4.22–5.81)
RDW: 15.7 % — ABNORMAL HIGH (ref 11.5–15.5)
WBC: 9.2 10*3/uL (ref 4.0–10.5)
nRBC: 0 % (ref 0.0–0.2)

## 2020-06-18 LAB — BASIC METABOLIC PANEL
Anion gap: 10 (ref 5–15)
BUN: 30 mg/dL — ABNORMAL HIGH (ref 8–23)
CO2: 13 mmol/L — ABNORMAL LOW (ref 22–32)
Calcium: 9.7 mg/dL (ref 8.9–10.3)
Chloride: 115 mmol/L — ABNORMAL HIGH (ref 98–111)
Creatinine, Ser: 1.03 mg/dL (ref 0.61–1.24)
GFR, Estimated: >60 mL/min (ref >60)
Glucose, Bld: 96 mg/dL (ref 70–99)
Potassium: 4 mmol/L (ref 3.5–5.1)
Sodium: 138 mmol/L (ref 135–145)

## 2020-06-18 NOTE — Progress Notes (Signed)
At 1838 vitals taken due to day shift not knowing that night shift would have a tech. RN will continue to monitor.

## 2020-06-18 NOTE — Progress Notes (Signed)
Update on sleep chart: PT had a restless night until about 0200am.. Pt slept from 0200am to 0645am.

## 2020-06-18 NOTE — Progress Notes (Addendum)
Occupational Therapy Session Note  Patient Details  Name: Carlos Olson MRN: [id number] Date of Birth: 1940-07-28  Today's Date: 06/18/2020  Session 1 OT Individual Time: 1000-1100 OT Individual Time Calculation (min): 60 min   Session 2 OT Individual Time: 5053-9767 OT Individual Time Calculation (min): 38 min    Short Term Goals: Week 3:  OT Short Term Goal 1 (Week 3): LTG=STG 2/2 ELOS  Skilled Therapeutic Interventions/Progress Updates:  Session 1   OT treatment session focused on self-care retraining and family education. Pt's daughter [name] present for family ed. [name] provided min A for functional ambulation, toilet/shower transfers, and bathing/dressing tasks. Pt voided bloody stool/urine in commode, needed min A for clothing management and hygiene. Educated on energy conservation techniques and how to cue pt for increased independence with self-care tasks. Discussed home set-up, including shower, DME needs/uses and home modifications for safe BADL participation. Pt left seated in wc at end of session with alarm belt on, call bell in reach and needs met.   Session 2 Pt greeted semi-reclined in bed asleep, daughter reports she has been trying to wake him up for the past 30 minutes. Pt needed verbal and tactile cues with environmental changes to wake all the way up. Pt very slow to initiate bed mobility requiring mod A to come to sitting 2/2 lethargy. Pt more awake once sitting EOB, but more frustrated. Pt confused and thought it was the morning. Pt needed multiple cues to re-orient to time and situation. Pt ambulated to bathroom w/ RW and CGA. Pt had been incontinent of bloody stool/urine in brief, then went more in commode. Pt was able to complete peri-care with CGA for balance. Pt frustrated at OT giving patient cues for safety and unsafely returned to sitting EOB. After rest break, pt agreeable to get back up to wc to shave at the sink. Pt's daughter present to supervise shaving  activity.  Therapy Documentation Precautions:  Precautions Precautions: Fall Precaution Comments: trach, flexiseal Restrictions Weight Bearing Restrictions: No Pain:  denies pain  Therapy/Group: Individual Therapy  Valma Cava 06/18/2020, 3:28 PM

## 2020-06-18 NOTE — Progress Notes (Signed)
Patient ID: RICKARD KENNERLY, male   DOB: 1941-02-17, 79 y.o.   MRN: [id number]  SW received updates from [facility] referral declined (on 12/17).   12/19- SW sent HHPT/OT/SLP/Aide/?RN referral to [facility]. SW waiting on follow-up.  12/20- SW waiting on updates from [place] [place] on if referral can be accepted.  *referral declined.   SW spoke with [place] to confirm referral accepted. SW informed will follow-up to confirm if SN is still needed at discharge. SW called pt dtr [name] to provide updates. States that she is continuing to work on finding someone to help assist with providing care to pt father.   Loralee Pacas, MSW, [place] Office: [phone] Cell: [phone] Fax: [phone]

## 2020-06-18 NOTE — Progress Notes (Signed)
Physical Therapy Session Note  Patient Details  Name: Carlos Olson MRN: [id number] Date of Birth: [birth year]  Today's Date: 06/18/2020 PT Individual Time: 1116-1158 PT Individual Time Calculation (min): 42 min   Short Term Goals: Week 3:  PT Short Term Goal 1 (Week 3): STGs = LTGs  Skilled Therapeutic Interventions/Progress Updates:     Pt received seated in WC and agreeable to therapy. No complaint of pain. WC transport to gym for time management. Pt ambulates 4x100' with extended seated rest breaks. Pt ambulates with forward flexed posture with distance from RW increasing with distance. Pt also occasionally hits feet together during reciprocal gait pattern. PT provides close CGA and multimodal cueing for increased upright gaze to improve posture and balance, increasing stride width, and increasing gait speed to decrease risk for falls. PT provides cues for pursed lip breathing during rest break to optimize recovery oxygen sats. Pt performs block training for sit to stand transfers to improve safety and efficiency of transfer and to improve functional strength. Pt completes 3x5 reps sit to stand with CGA and PT providing cues on hand placement and sequencing. Pt instructed to ambulate final bout to fatigue, completing 120' with RW prior to requiring seated rest break. Pt left seated in WC with alarm intact and all needs within reach.  Therapy Documentation Precautions:  Precautions Precautions: Fall Precaution Comments: trach, flexiseal Restrictions Weight Bearing Restrictions: No   Therapy/Group: Individual Therapy  Breck Coons, PT, DPT 06/18/2020, 12:07 PM

## 2020-06-18 NOTE — Discharge Summary (Signed)
Physician Discharge Summary  Patient ID: Carlos Olson MRN: [id number] DOB/AGE: March 25, 1941 79 y.o.  Admit date: 05/30/2020 Discharge date: 06/20/2020  Discharge Diagnoses:  Principal Problem:   Debility Active Problems:   Tracheostomy present ([place])   Moderate hypoxic-ischemic encephalopathy DVT prophylaxis Mood stabilization AKI Atrial fibrillation Acute on chronic anemia COPD with remote history of tobacco abuse Hyperlipidemia Prostate cancer with prostatectomy Colovesical fistula Dysphagia AAA repair  Discharged Condition: Stable  Significant Diagnostic Studies: DG CHEST PORT 1 VIEW  Result Date: 05/23/2020 CLINICAL DATA:  COPD EXAM: PORTABLE CHEST 1 VIEW COMPARISON:  05/20/2020 FINDINGS: Tracheostomy tube and enteric tube are unchanged in position. Shallow inspiration with atelectasis in the lung bases. Emphysematous changes in the lungs. Diffuse airspace disease in the lungs is improving since prior study. Mild cardiac enlargement. IMPRESSION: Improving bilateral airspace disease. Shallow inspiration with atelectasis in the lung bases. Electronically Signed   By: Lucienne Capers M.D.   On: 05/23/2020 06:16   DG Abd Portable 1V  Result Date: 05/23/2020 CLINICAL DATA:  79 year old male feeding tube placement. EXAM: PORTABLE ABDOMEN - 1 VIEW COMPARISON:  CT Abdomen and Pelvis 05/09/2020 and earlier. FINDINGS: Portable AP semi upright view at 1553 hours. Enteric feeding tube courses into the stomach, terminates at the level of the gastric body about 8 cm from the midline. Negative visible bowel gas pattern, with some retained oral contrast in the large bowel. Improved lung base ventilation from the prior CT. IMPRESSION: Enteric feeding tube placed into the stomach. Recommend advancing 12-14 cm to allow enough slack if post pyloric placement is desired. Electronically Signed   By: Genevie Ann M.D.   On: 05/23/2020 16:01   DG Swallowing Func-Speech Pathology  Result Date:  05/22/2020 Objective Swallowing Evaluation: Type of Study: MBS-Modified Barium Swallow Study  Patient Details Name: Carlos Olson MRN: [id number] Date of Birth: 27-Jul-1940 Today's Date: 05/22/2020 Time: SLP Start Time (ACUTE ONLY): 1000 -SLP Stop Time (ACUTE ONLY): 1024 SLP Time Calculation (min) (ACUTE ONLY): 24 min Past Medical History: Past Medical History: Diagnosis Date  AAA (abdominal aortic aneurysm) ([place])   Atherosclerosis of aortic arch ([place]) 09/25/2015  By CT scan   CAD (coronary artery disease) 09/25/2015  Mild 3v by CT scan   Cancer [facility])   prostate  Colon polyps   COPD (chronic obstructive pulmonary disease) ([facility])   Diverticulosis of colon   Femoral bruit   bilateral, found by Dr. Jefm Bryant  Heart murmur   child  History of prostate cancer   IBS (irritable bowel syndrome)   improved after retirement  Osteoarthritis   knees and cervical/lumbar spine s/p surgeries and injections  Pelvic fracture ([place])   Pneumonia   child  Pulmonary nodules 09/25/2015  On screening CT scan, rpt 1 yr   Shortness of breath   doe Past Surgical History: Past Surgical History: Procedure Laterality Date  ANTERIOR CERVICAL DECOMP/DISCECTOMY FUSION  07/29/2011  Procedure: ANTERIOR CERVICAL DECOMPRESSION/DISCECTOMY FUSION 1 LEVEL;  Surgeon: Jessy Oto, MD;  Location: [place];  Service: Orthopedics;  Laterality: N/A;  Anterior Cervical Discectomy and Fusion C3-4  AORTA - BILATERAL FEMORAL ARTERY BYPASS GRAFT N/A 04/17/2020  Procedure: AORTA BIFEMORAL BYPASS GRAFT USING A HEMASHIELD GOLD BIFURCATED GRAFT;  Surgeon: Waynetta Sandy, MD;  Location: [place];  Service: Vascular;  Laterality: N/A;  BUBBLE STUDY  04/26/2020  Procedure: BUBBLE STUDY;  Surgeon: Freada Bergeron, MD;  Location: [place];  Service: Cardiovascular;;  CARDIOVERSION N/A 04/26/2020  Procedure: CARDIOVERSION;  Surgeon: Freada Bergeron, MD;  Location: [place] [facility];  Service: Cardiovascular;  Laterality: N/A;  CATARACT  EXTRACTION W/PHACO Right 07/10/2015  Procedure: CATARACT EXTRACTION PHACO AND INTRAOCULAR LENS PLACEMENT (IOC);  Surgeon: Birder Robson, MD;  Location: [facility];  Service: Ophthalmology;  Laterality: Right;  [place] 00:58   CATARACT EXTRACTION W/PHACO Left 07/24/2015  Procedure: CATARACT EXTRACTION PHACO AND INTRAOCULAR LENS PLACEMENT (IOC);  Surgeon: Birder Robson, MD;  Location: [facility];  Service: Ophthalmology;  Laterality: Left;  [place] 00:38   COLONOSCOPY  12/2007  [place], diverticulosis and polyp, rpt 5 yrs  dental implants    EMBOLIZATION Right 03/26/2020  Procedure: EMBOLIZATION;  Surgeon: Waynetta Sandy, MD;  Location: [place] [facility];  Service: Cardiovascular;  Laterality: Right;  FORAMINOTOMY 2 LEVEL Left 10/2013  C4/5, C5/6 ([place])  HERNIA REPAIR    right 05/1999, left 09/04/05  JOINT REPLACEMENT Right   TKR  KNEE ARTHROPLASTY Left 05/20/2017  Procedure: COMPUTER ASSISTED TOTAL KNEE ARTHROPLASTY;  Surgeon: Dereck Leep, MD;  Location: [facility];  Service: Orthopedics;  Laterality: Left;  KNEE ARTHROSCOPY  09/1997  left  POSTERIOR CERVICAL FUSION/FORAMINOTOMY N/A 11/22/2013  Procedure: LEFT C4-5 AND C5-6 FORAMINOTOMY;  Surgeon: Jessy Oto, MD;  Location: [place];  Service: Orthopedics;  Laterality: N/A;  PROSTATECTOMY  08/1990  REPLACEMENT TOTAL KNEE Right 07/2012  TEE WITHOUT CARDIOVERSION N/A 04/26/2020  Procedure: TRANSESOPHAGEAL ECHOCARDIOGRAM (TEE);  Surgeon: Freada Bergeron, MD;  Location: [facility];  Service: Cardiovascular;  Laterality: N/A;  TONSILLECTOMY   HPI: Pt is an 79 year old male who presented 10/18 for elective endovascular repair abdominal aortic aneurysm. Hospital course complicated by AKI, metabolic encephalopathy, acute hypercarbic respiratory failure, hypoxia/ RLL pneumonia, and hypotension. Treated in ICU for sepsis, AFRVR, underwent cardioversion, ABX course. SLP ordered 10/28 given pt coughing with PO liquid intake. MBS 10/29: mild oropharyngeal  dysphagia characterized by reduced bolus propulsion, reduced lingual retraction, reduced pharyngeal constriction, and reduced anterior laryngeal movement. Recommended dys 3 diet with thin liquid. Pt became obtunded on 11/2 and was transferred back to ICU. Pt made NPO and Cortrak placed on 11/3, SLP recommended pt remain NPO with water protocol. Pt reintubated 11/5-11/8. CXR 11/9 consistent with previous small bilateral pleural effusions and right lung base aeration. Pt had repeat bedside swallow evaluation on 11/9 and was asymptomatic for aspiration but demonstrated a baseline congested cough and increased work of breathing. Pt was re-intubated shortly after swallow evaluation and trach was placed on 11/10. CXR 11/16: Diffuse bilateral pulmonary infiltrates/edema with slight improvement.  Subjective: None stated Assessment / Plan / Recommendation CHL IP CLINICAL IMPRESSIONS 05/22/2020 Clinical Impression Pt was seen for MBS, which revealed mild oropharyngeal dysphagia characterized by weak lingual manipulation, reduced bolus propulsion, and reduced laryngeal mobility and closure. His swallow function is slightly improved as compared to the previous MBS on 04/27/20 as he demonstrated overall increased strength of oropharyngeal musculature. In particular, the oral phase of swallowing was more efficient in the present study. The pt's slightly reduced laryngeal mobility and closure lead to ocassional instances of penetration during the swallow with thin liquids. On one ocassion, a trace amount passed below the vocal folds without any attempt to eject the material (PAS 8). No other instances of penetration/aspiration were observed. Pharyngeal residue was observed in the valleculae and pyriforms. However, he often produced an additional swallow independently that cleared some of the residue. At this time, recommend dys 3 diet with thin liquids. Encourage frequent throat clearing throughout meal, and pt should follow  solids with liquids. SLP will f/u acutely  for diet toleration and advancement.  SLP Visit Diagnosis Dysphagia, oropharyngeal phase (R13.12) Attention and concentration deficit following -- Frontal lobe and executive function deficit following -- Impact on safety and function Mild aspiration risk   CHL IP TREATMENT RECOMMENDATION 05/22/2020 Treatment Recommendations Therapy as outlined in treatment plan below   Prognosis 05/22/2020 Prognosis for Safe Diet Advancement Good Barriers to Reach Goals -- Barriers/Prognosis Comment -- CHL IP DIET RECOMMENDATION 05/22/2020 SLP Diet Recommendations Dysphagia 3 (Mech soft) solids;Thin liquid Liquid Administration via Cup;Straw Medication Administration Whole meds with liquid Compensations Slow rate;Small sips/bites;Minimize environmental distractions;Follow solids with liquid;Clear throat intermittently Postural Changes Seated upright at 90 degrees   CHL IP OTHER RECOMMENDATIONS 05/22/2020 Recommended Consults -- Oral Care Recommendations Oral care BID Other Recommendations --   CHL IP FOLLOW UP RECOMMENDATIONS 05/22/2020 Follow up Recommendations LTACH   CHL IP FREQUENCY AND DURATION 05/22/2020 Speech Therapy Frequency (ACUTE ONLY) min 2x/week Treatment Duration 2 weeks      CHL IP ORAL PHASE 05/22/2020 Oral Phase Impaired Oral - Pudding Teaspoon -- Oral - Pudding Cup -- Oral - Honey Teaspoon -- Oral - Honey Cup -- Oral - Nectar Teaspoon -- Oral - Nectar Cup Weak lingual manipulation;Reduced posterior propulsion Oral - Nectar Straw -- Oral - Thin Teaspoon -- Oral - Thin Cup Reduced posterior propulsion;Weak lingual manipulation Oral - Thin Straw Weak lingual manipulation;Reduced posterior propulsion Oral - Puree Reduced posterior propulsion;Weak lingual manipulation Oral - Mech Soft NT Oral - Regular Weak lingual manipulation;Reduced posterior propulsion Oral - Multi-Consistency -- Oral - Pill -- Oral Phase - Comment --  CHL IP PHARYNGEAL PHASE 05/22/2020 Pharyngeal Phase  Impaired Pharyngeal- Pudding Teaspoon -- Pharyngeal -- Pharyngeal- Pudding Cup -- Pharyngeal -- Pharyngeal- Honey Teaspoon -- Pharyngeal -- Pharyngeal- Honey Cup -- Pharyngeal -- Pharyngeal- Nectar Teaspoon -- Pharyngeal -- Pharyngeal- Nectar Cup Reduced anterior laryngeal mobility;Pharyngeal residue - valleculae;Pharyngeal residue - pyriform Pharyngeal -- Pharyngeal- Nectar Straw -- Pharyngeal -- Pharyngeal- Thin Teaspoon -- Pharyngeal -- Pharyngeal- Thin Cup Pharyngeal residue - pyriform;Pharyngeal residue - valleculae;Reduced anterior laryngeal mobility;Penetration/Aspiration during swallow Pharyngeal Material enters airway, remains ABOVE vocal cords then ejected out Pharyngeal- Thin Straw Reduced anterior laryngeal mobility;Pharyngeal residue - valleculae;Pharyngeal residue - pyriform;Trace aspiration;Penetration/Aspiration during swallow Pharyngeal Material enters airway, passes BELOW cords without attempt by patient to eject out (silent aspiration) Pharyngeal- Puree Pharyngeal residue - valleculae;Pharyngeal residue - pyriform;Reduced anterior laryngeal mobility Pharyngeal -- Pharyngeal- Mechanical Soft NT Pharyngeal -- Pharyngeal- Regular Pharyngeal residue - pyriform;Pharyngeal residue - valleculae;Reduced anterior laryngeal mobility Pharyngeal -- Pharyngeal- Multi-consistency -- Pharyngeal -- Pharyngeal- Pill NT Pharyngeal -- Pharyngeal Comment --  CHL IP CERVICAL ESOPHAGEAL PHASE 04/27/2020 Cervical Esophageal Phase WFL Pudding Teaspoon -- Pudding Cup -- Honey Teaspoon -- Honey Cup -- Nectar Teaspoon -- Nectar Cup -- Nectar Straw -- Thin Teaspoon -- Thin Cup -- Thin Straw -- Puree -- Mechanical Soft -- Regular -- Multi-consistency -- Pill -- Cervical Esophageal Comment -- Herbie Baltimore, MA CCC-SLP Acute Rehabilitation Services Pager [phone] Office [phone] Lynann Beaver 05/22/2020, 11:19 AM               Labs:  Basic Metabolic Panel: Recent Labs  Lab 06/15/20 0521 06/18/20 0737   NA 136 138  K 4.0 4.0  CL 111 115*  CO2 15* 13*  GLUCOSE 104* 96  BUN 46* 30*  CREATININE 1.33* 1.03  CALCIUM 9.5 9.7    CBC: Recent Labs  Lab 06/15/20 0521 06/18/20 0737  WBC 8.8 9.2  HGB 10.7* 10.2*  HCT 34.3* 33.1*  MCV 94.8 96.5  PLT 326 284    CBG: No results for input(s): GLUCAP in the last 168 hours.  Family history.  Mother with hypertension Father with bone and prostate cancer Sister with diabetes.  Denies any colon cancer esophageal cancer or rectal cancer  Brief HPI:   Carlos Olson is a 79 y.o. 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  Patient lives with spouse reportedly independent prior to admission.  Daughter and son very involved with support.  Presented 04/17/2020 for elective AAA repair as well as right common femoral artery aneurysm repair per Dr. Donzetta Matters.  Postoperative developed hypotension requiring phenylephrine and cardiology service follow-up.  Most recent echocardiogram reviewed showing ejection fraction of 70% grade 1 diastolic dysfunction.  TEE completed showing ejection fraction of 50 to 55% no wall motion abnormalities without thrombus.  Noted AKI with creatinine peak 2.7.  Renal ultrasound no hydronephrosis.  Nephrology services consulted for follow-up.  Patient developed abdominal pain concern for mesenteric ischemia with gastroenterology services consulted Dr. Wilfrid Lund.  CT abdomen pelvis showed abdominal aortic repair some stranding noted no sizable hematoma seen.  There was noted near complete consolidation of right lower lobe small left pleural effusion seen with left basilar atelectasis.  Patient developed delirium placed on Seroquel felt to be multifactorial with cranial CT scan 04/23/2020 without acute changes.  Patient  was ultimately placed on Eliquis for findings of atrial fibrillation.  A nasogastric tube placed for nutritional support as well as hydration and renal function improved latest creatinine 1.06.  Patient did require intubation for ventilatory support due to respiratory distress 05/04/2020 with tracheostomy performed 05/09/2020 per critical care services as well as treated for MSSA pneumonia.  Patient was currently maintained on a cuffless #6 Shiley trach tube.  Urology consulted 05/09/2020 for feculent urine with concern for cola vascular fistula and no current urological management needed as well is reviewed CT scan abdomen with gastroenterology services no radiographic evidence for IBD colon cancer or complications of diverticulitis.  There was consideration for endoscopy of which patient declined.  His diet was slowly advanced.  Hospital course complicated by acute blood loss anemia 9.6 and monitored.  Therapy evaluations completed due to ongoing debility and patient was admitted for a comprehensive rehab program.   Hospital Course: Carlos Olson was admitted to rehab 05/30/2020 for inpatient therapies to consist of PT, ST and OT at least three hours five days a week. Past admission physiatrist, therapy team and rehab RN have worked together to provide customized collaborative inpatient rehab.  Patient with long complicated hospital course in regards to debility secondary to AAA repair 96/28/3662 was complicated by acute respiratory failure MSSA pneumonia completing antibiotic course.  Patient remained on Eliquis no bleeding episodes.  Pain management monitored closely Lidoderm patch only no narcotics due to increasing risk of confusion.  Mood stabilization with low-dose Seroquel which was discussed at length with family.  Daughter had noted patient baseline memory cognitive deficits prior to this hospitalization his most recent CT demonstrated diffuse atrophy.  Daughter also stated patient had some mild left  arm weakness after history of ACDF.  Tracheostomy tube decannulated 06/07/2020 monitoring of oxygen saturations.  AKI improved and stable with latest creatinine 1.33.  Atrial fibrillation cardiac rate controlled continue beta-blocker as well as noted Eliquis follow-up cardiology services.  Acute on chronic anemia no bleeding episodes latest hemoglobin 10.2.  History of COPD remote tobacco abuse monitoring for oxygen saturations.  In regards to patient's colovesical fistula not a surgical candidate follow-up urology service as well as gastroenterology service.  Patient had declined endoscopy.  At this point it did not appear that there is vesicular component to fistula/loose stool Questran was added twice daily as well as fiber.  His rectal tube was removed.  Long discussion with urology as well as gastroenterology no imminent plans for follow-up given that he is passing clear uncontaminated urine and does not appear to be a candidate for surgery.  Plan at this time was discharged home and follow-up outpatient.   Blood pressures were monitored on TID basis and soft and monitored      Rehab course: During patient's stay in rehab weekly team conferences were held to monitor patient's progress, set goals and discuss barriers to discharge. At admission, patient required minimal assist sit to stand minimal assist 10 feet with walker moderate assist sit to supine.  Moderate assist upper body bathing total assist lower body bathing max is upper body dressing total assist lower body dressing  Physical exam.  Blood pressure 105/51 pulse 65 temperature 992 respiration 21 oxygen saturation 100% room air Constitutional.  Sitting up in chair no acute distress HEENT Head.  Normocephalic and atraumatic Eyes.  Pupils round and reactive to light no discharge.nystagmus Neck.  Supple nontender no JVD tracheostomy site clean and dry Cardiac.  Regular rate and rhythm Abdomen.  Soft nontender positive bowel sounds without  rebound Musculoskeletal no edema or tenderness in extremities Neurologic.  He was alert and oriented x2 with some increased time to complete his thoughts.  Makes eye contact with examiner strength grossly graded 4+/5 throughout right greater than left.  He/  has had improvement in activity tolerance, balance, postural control as well as ability to compensate for deficits. He/ has had improvement in functional use RUE/LUE  and RLE/LLE as well as improvement in awareness.  Patient performed rolling right to left with min mod assist with max of cues for sequencing and technique.  Patient perform supine to sit from sit in a flat bed without use of bed rails with minimal assist contact-guard.  Patient perform sit to stand with moderate assist for forward weight shift and boosting up x1 then minimal assist from the bed x1 with bedside commode.  Provided verbal cues for scooting forward.  Patient with posterior lean in standing initially able to self-correct.  Patient ambulates 12 feet to and from bathroom rolling walker contact-guard assist.  Ambulated with forward trunk flexion and posterior bias.  Ongoing aggressive family teaching.  ADL supine to side-lying with CS, increased time.  Sit to stand and short distance ambulation with rolling walker bed to wheelchair with minimal assist.  SLP provided skilled education to family on how to modify activities that were personally meaningful to patient in order to allow family to address cognitive remediation compensation in between therapy sessions in the home environment and maximize treatment effects.  Full family teaching completed plan discharge to home       Disposition: Discharge to home    Diet: Regular  Special Instructions: No driving smoking or alcohol  Routine skin checks  Medications at discharge 1.  Tylenol as needed 2.  Eliquis 5 mg twice daily 3.  Lipitor 10 mg nightly 4.  Tessalon 100 mg 3 times daily 5.  Questran 4 g twice daily 6.  Lidoderm patch change as directed 7.  Lopressor 50 mg twice daily 8.  Seroquel 50 mg nightly 9.  Fiber 1 packet twice daily  30-35 minutes were spent completing discharge summary and discharge planning Discharge Instructions    Ambulatory referral to Physical Medicine Rehab   Complete by: As directed    Moderate complexity follow-up 1 month debility /AAA repair       Follow-up Information    Raulkar, Clide Deutscher, MD Follow up.   Specialty: Physical Medicine and Rehabilitation Why: Office to call for appointment Contact information: [address] [place] [address] [phone]        Waynetta Sandy, MD Follow up.   Specialties: Vascular Surgery, Cardiology Why: Call for appointment Contact information: [place] [address] [place], [place], MD Follow up.   Specialty: Gastroenterology Why: Call for appointment Contact information: [address] [place] [address] [phone]        Werner Lean, MD Follow up.   Specialty: Cardiology Why: Call for appointment Contact information: [place] [address] [phone]        Franchot Gallo, MD Follow up.   Specialty: Urology Why: Call for appointment Contact information: [place] [address] [phone]        Collene Gobble, MD Follow up.   Specialty: Pulmonary Disease Why: Call for appointment Contact information: [place] [address] [phone]               Signed: Lavon Paganini [place] 06/20/2020, 5:07 AM

## 2020-06-18 NOTE — Progress Notes (Signed)
[facility]  Subjective  - Speech clear, states he is doing better slowly.   Objective 127/67 70 97.9 F (36.6 C) 20 94%  Intake/Output Summary (Last 24 hours) at 06/18/2020 0745 Last data filed at 06/18/2020 1610 Gross per 24 hour  Intake 2495.97 ml  Output --  Net 2495.97 ml   Moving extremities SLR B LE, motor intact Abdomin soft, groins soft    Assessment/Planning: S/P Aortobifem bypass  Slow steady progress Family is in training for home care and discharge planning   Roxy Horseman 06/18/2020 7:45 AM --  Laboratory Lab Results: No results for input(s): WBC, HGB, HCT, PLT in the last 72 hours. BMET No results for input(s): NA, K, CL, CO2, GLUCOSE, BUN, CREATININE, CALCIUM in the last 72 hours.  COAG Lab Results  Component Value Date   INR 1.3 (H) 05/07/2020   INR 1.5 (H) 04/26/2020   INR 1.2 04/17/2020   No results found for: PTT

## 2020-06-18 NOTE — Progress Notes (Signed)
Speech Language Pathology Daily Session Note  Patient Details  Name: Carlos Olson MRN: [id number] Date of Birth: 11-Jul-1940  Today's Date: 06/18/2020 SLP Individual Time: 1215-1245 SLP Individual Time Calculation (min): 30 min and Today's Date: 06/18/2020 SLP Missed Time: 30 Minutes Missed Time Reason: Patient fatigue  Short Term Goals: Week 3: SLP Short Term Goal 1 (Week 3): STGS=LTGs due to ELOS  Skilled Therapeutic Interventions: Skilled treatment session focused on dysphagia and cognitive goals. Upon arrival, patient was awake while upright in the wheelchair and appeared lethargic. SLP provided tray set-up and Max encouragement for PO intake with lunch meal of regular textures with thin liquids. Patient consumed minimal amounts of meal (suspect due to fatigue) and demonstrated an intermittent wet vocal quality that cleared with a spontaneous throat clear. Suspect wet vocal quality was due to prolonged mastication and decreased attention to bolus. Recommend patient continue current diet. Throughout meal, patient required Mod verbal cues for sustained attention to task and often would use his hands as if he had something in them like a paper towel or utensil when they were empty. Patient transferred back to bed at end of session. Patient left supine in bed with alarm on and daughter present. Continue with current plan of care.      Pain Pain in back when lying in supine, patient repositioned   Therapy/Group: Individual Therapy  Klaire Court 06/18/2020, 3:25 PM

## 2020-06-18 NOTE — Progress Notes (Signed)
[facility] PHYSICAL MEDICINE & REHABILITATION PROGRESS NOTE   Subjective/Complaints:  Pt didn't sleep that well last night even though he had a busy day yesterday. Daughter at bedside this morning.   ROS: Limited due to cognitive/behavioral    Objective:   No results found. Recent Labs    06/18/20 0737  WBC 9.2  HGB 10.2*  HCT 33.1*  PLT 284   Recent Labs    06/18/20 0737  NA 138  K 4.0  CL 115*  CO2 13*  GLUCOSE 96  BUN 30*  CREATININE 1.03  CALCIUM 9.7    Intake/Output Summary (Last 24 hours) at 06/18/2020 1114 Last data filed at 06/18/2020 0900 Gross per 24 hour  Intake 2575.97 ml  Output --  Net 2575.97 ml        Physical Exam: Vital Signs Blood pressure 128/66, pulse 68, temperature 97.9 F (36.6 C), resp. rate 20, height 6\' 3"  (1.905 m), weight 81.8 kg, SpO2 94 %. Constitutional: No distress . Vital signs reviewed. HEENT: EOMI, oral membranes moist Neck: supple Cardiovascular: RRR without murmur. No JVD    Respiratory/Chest: CTA Bilaterally without wheezes or rales. Normal effort    GI/Abdomen: BS +, non-tender, non-distended Ext: no clubbing, cyanosis, or edema Psych: pleasant, cooperative, confused Skin: Buttock redness.  Trach stoma closed with scab Musculoskeletal: Full ROM, No pain with AROM or PROM in the neck, trunk, or extremities. Neuro: fairly alert. Oriented to person, hospital  [place] 4/5 BLE, sensation intact, tone is normal, LUE 3-4/5--stable     Assessment/Plan: 1. Functional deficits which require 3+ hours per day of interdisciplinary therapy in a comprehensive inpatient rehab setting.  Physiatrist is providing close team supervision and 24 hour management of active medical problems listed below.  Physiatrist and rehab team continue to assess barriers to discharge/monitor patient progress toward functional and medical goals  Care Tool:  Bathing  Bathing activity did not occur: Safety/medical concerns Body parts bathed by  patient: Left arm,Right arm,Chest,Abdomen,Face,Front perineal area,Right upper leg,Left upper leg   Body parts bathed by helper: Right lower leg,Left lower leg,Buttocks     Bathing assist Assist Level: Moderate Assistance - Patient 50 - 74%     Upper Body Dressing/Undressing Upper body dressing   What is the patient wearing?: Pull over shirt    Upper body assist Assist Level: Minimal Assistance - Patient > 75%    Lower Body Dressing/Undressing Lower body dressing      What is the patient wearing?: Pants,Incontinence brief     Lower body assist Assist for lower body dressing: Maximal Assistance - Patient 25 - 49%     Toileting Toileting Toileting Activity did not occur (Clothing management and hygiene only): N/A (no void or bm) (patient has a rectal tube in- no void)  Toileting assist Assist for toileting: Maximal Assistance - Patient 25 - 49%     Transfers Chair/bed transfer  Transfers assist  Chair/bed transfer activity did not occur: Safety/medical concerns  Chair/bed transfer assist level: Contact Guard/Touching assist Chair/bed transfer assistive device: Programmer, multimedia   Ambulation assist      Assist level: Contact Guard/Touching assist Assistive device: Walker-rolling Max distance: 25'   Walk 10 feet activity   Assist  Walk 10 feet activity did not occur: Safety/medical concerns  Assist level: Contact Guard/Touching assist Assistive device: Parallel bars   Walk 50 feet activity   Assist Walk 50 feet with 2 turns activity did not occur: Safety/medical concerns  Assist level: Contact Guard/Touching assist  Assistive device: Walker-rolling    Walk 150 feet activity   Assist Walk 150 feet activity did not occur: Safety/medical concerns  Assist level: Minimal Assistance - Patient > 75% Assistive device: Walker-rolling    Walk 10 feet on uneven surface  activity   Assist Walk 10 feet on uneven surfaces activity did not  occur: Safety/medical concerns         Wheelchair     Assist Will patient use wheelchair at discharge?: Yes Type of Wheelchair: Manual    Wheelchair assist level: Minimal Assistance - Patient > 75% Max wheelchair distance: 20'    Wheelchair 50 feet with 2 turns activity    Assist        Assist Level: Maximal Assistance - Patient 25 - 49%   Wheelchair 150 feet activity     Assist      Assist Level: Total Assistance - Patient < 25%   Blood pressure 128/66, pulse 68, temperature 97.9 F (36.6 C), resp. rate 20, height 6\' 3"  (1.905 m), weight 81.8 kg, SpO2 94 %.  Medical Problem List and Plan: 1.  Debility secondary to AAA repair 74/01/1447 complicated by acute respiratory failure/MSSA pneumonia             -patient may not shower             -ELOS/Goals: Supervision/Mod I 12/22             Continue PT, OT, SLP--family ed in preparation of dc tomorrow  -Patient to see MD in the office for transitional care encounter in 1-2 weeks.  2.  Antithrombotics: -DVT/anticoagulation: Eliquis             -antiplatelet therapy: N/A 3. Pain Management: Lidoderm patch only.    -no narcs d/t delirium  12/18- will offer tylenol prn when needed             Monitor with increased exertion 4. Mood: Provide emotional support             -antipsychotic agents: continue seroquel at 50mg  QHS    -continue sleep chart  12/20 didn't fall asleep until after 0300 last night 5. Neuropsych: This patient is not fully capable of making decisions on his own behalf. Neuropsych eval today, appears encephalopathic , he misttook his daughter for wife during OT session 12/5 Neuropsych eval   -see above. Pt had baseline cognitive deficits  -12/20 cognitive status waxes and wanes--I suspect he'll be better once home in familiar environment. I also suspect he suffered some anoxic injury during his critical illness 6. Skin/Wound Care: Routine skin checks  -EPBC for buttocks 7.  Fluids/Electrolytes/Nutrition:               CMP reviewed, albumin low, ongoing AKI  -added megace for appetite--eats 25-50%. Not a big eater per daughter  -continue protein supps  12/20 intake overall improved   -encouraging PO fluids   -added HS IVF to supplement over weekend   -f/u BMET today improved (bun 30)   -continue IVF tonight   -spoke with daughter about importance of fluid intake especially with his loose stools    8.  Tracheostomy   12/9 trach dc'ed 12/8-stoma closing  -intermittent cough    -added mucinex   -continue nebs   -cough mostly in early AM   -improved 12/17  12/18- ordered tessalon prn for cough 9.  AKI.  Improved.  Renal ultrasound no hydronephrosis.   10.  Hypotension.  Monitor with increased mobility. 11.  Atrial fibrillation.  Lopressor 50 mg twice daily, continue Eliquis.  Cardiac rate controlled.   controlled 12/9 12.  Acute on chronic anemia.                         Hgb trending up to 9.7 --10.2 on 12/11   -recheck Monday  13.  COPD with remote history of tobacco abuse.  Continue nebulizers as directed 14.  Hyperlipidemia.  Lipitor 15.  Prostate cancer with prostatectomy 1992.  Follow-up outpatient 16.  Colovesical fistula.    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  Follow-up urology as well as gastroenterology services.  Patient currently declined endoscopy. At this point, it doesn't appear that there is vesicular component to fistula/loose stool  -increased questran to bid, continue bid fiber  12/11:daughter concerned about discomfort of rectal tube and asked to have it removed. She is aware of the risks of skin breakdown and importance of regular cleaning.   12/12: feels more comfortable without rectal tube.   12/13 spoke with urology/GI. No imminent plans for f/u given that he's passing clear, uncontaminated urine and is not a candidate for surgery, further intervention.   12/20 plan at this time is to dc home with rectal tube or keeping patient as  clean and as dry as possible given loose stool   -continue EPBC for buttocks   -pt having more formed stools  17. Dysphagia: D3 diet, tolerating  18. Runny nose  12/18- ordered flonase daily     LOS: 19 days A FACE TO [employer] 06/18/2020, 11:14 AM

## 2020-06-19 ENCOUNTER — Inpatient Hospital Stay (HOSPITAL_COMMUNITY): Payer: Medicare HMO | Admitting: Occupational Therapy

## 2020-06-19 ENCOUNTER — Inpatient Hospital Stay (HOSPITAL_COMMUNITY): Payer: Medicare HMO | Admitting: Speech Pathology

## 2020-06-19 ENCOUNTER — Inpatient Hospital Stay (HOSPITAL_COMMUNITY): Payer: Medicare HMO

## 2020-06-19 ENCOUNTER — Telehealth: Payer: Self-pay | Admitting: *Deleted

## 2020-06-19 MED ORDER — METOPROLOL TARTRATE 50 MG PO TABS: 50.0000 mg | ORAL_TABLET | Freq: Two times a day (BID) | ORAL | 0 refills | Status: AC

## 2020-06-19 MED ORDER — QUETIAPINE FUMARATE 50 MG PO TABS: 50.0000 mg | ORAL_TABLET | Freq: Every day | ORAL | 0 refills | Status: AC

## 2020-06-19 MED ORDER — LIDOCAINE 5 % EX PTCH: 1.0000 | MEDICATED_PATCH | CUTANEOUS | 0 refills | Status: AC

## 2020-06-19 MED ORDER — ACETAMINOPHEN 325 MG PO TABS: 650.0000 mg | ORAL_TABLET | Freq: Four times a day (QID) | ORAL | Status: AC | PRN

## 2020-06-19 MED ORDER — CHOLESTYRAMINE 4 G PO PACK: 4.0000 g | PACK | Freq: Two times a day (BID) | ORAL | 12 refills | Status: AC

## 2020-06-19 MED ORDER — APIXABAN 5 MG PO TABS: 5.0000 mg | ORAL_TABLET | Freq: Two times a day (BID) | ORAL | 0 refills | Status: AC

## 2020-06-19 MED ORDER — ATORVASTATIN CALCIUM 10 MG PO TABS: 10.0000 mg | ORAL_TABLET | Freq: Every day | ORAL | 0 refills | Status: AC

## 2020-06-19 MED ORDER — NUTRISOURCE FIBER PO PACK: 1.0000 | PACK | Freq: Two times a day (BID) | ORAL | 0 refills | Status: AC

## 2020-06-19 NOTE — Telephone Encounter (Signed)
Cindy from [facility] called to say they can start ST on Thursday and PT/SN next week.

## 2020-06-19 NOTE — Progress Notes (Addendum)
Speech Language Pathology Discharge Summary  Patient Details  Name: Carlos Olson MRN: [id number] Date of Birth: 12/21/1940  Today's Date: 06/19/2020 SLP Individual Time: 0915-0955 SLP Individual Time Calculation (min): 40 min   Skilled Therapeutic Interventions:  Skilled treatment session focused on dysphagia and cognitive goals. Upon arrival, patient appeared confused and reported he was "left" in what he perceived to be a random room. SLP reoriented patient and patient agreeable to information provided to place and situation. Patient declined breakfast earlier in the day but was agreeable to consuming cereal with coffee. Patient consumed meal without overt s/s of aspiration and required intermittent Min verbal cues for problem solving throughout self-feeding. SLP also facilitated session by administering subtests of the [place] (SLUMS). Patient was oriented to day of the week and year but required Min question cues for orientation to state. Patient also required Max-Total A for short-term recall and attention throughout subtests. Patient with intermittent language of confusion but was easily redirected. Patient left upright in bed with alarm on and all needs within reach.   Patient has met 8 of 8 long term goals.  Patient to discharge at [facility] level.   Reasons goals not met: Patient can require up to Mod-Max A verbal cues for recall of daily, functional information.   Clinical Impression/Discharge Summary: Patient has made functional gains and has met 7 of 8 LTGs this admission. Currently, patient is consuming regular textures with thin liquids with minimal overt s/s of aspiration and is overall Mod I for use of swallowing compensatory strategies. Patient's overall cognitive functioning tends to fluctuate, especially when lethargic due to poor sleep. Overall, patient requires Min A verbal cues to complete functional and familiar tasks safely in regards to  attention, problem solving, and awareness. Mod-Max A verbal cues can be needed at times as well for recall of daily, functional information. Patient and family education is complete and patient will discharge home with 24 hour supervision from family. Patient would benefit from f/u SLP services to maximize his cognitive functioning and overall functional independence in order to reduce caregiver burden.   Care Partner:  Caregiver Able to Provide Assistance: Yes  Type of Caregiver Assistance: Physical;Cognitive  Recommendation:  24 hour supervision/assistance;Home Health SLP  Rationale for SLP Follow Up: Reduce caregiver burden;Maximize cognitive function and independence   Equipment: N/A   Reasons for discharge: Discharged from hospital;Treatment goals met   Patient/Family Agrees with Progress Made and Goals Achieved: Yes    [place], [place] 06/19/2020, 6:36 AM

## 2020-06-19 NOTE — Progress Notes (Signed)
RN assessed pt this morning and pt had coarse crackles on auscultation along with gurgling when breathing. RN stopped fluids and notified Dan at bedside. RN will continue to monitor.

## 2020-06-19 NOTE — Progress Notes (Signed)
Patient ID: Carlos Olson, male   DOB: 01-28-1941, 79 y.o.   MRN: [id number]  SW met with pt wife in room and reviewed discharge with regard to [facility]. SW confirms DME: 3in1 BSC and w/c delivered to room.  She reports pt dtr [name] will come and pick up pt tomorrow.   Loralee Pacas, MSW, [place] Office: [phone] Cell: [phone] Fax: [phone]

## 2020-06-19 NOTE — Progress Notes (Signed)
Nutrition Follow-up  DOCUMENTATION CODES:   Severe malnutrition in context of acute illness/injury  INTERVENTION:   -Continue Boost Breeze po TID, each supplement provides 250 kcal and 9 grams of protein  -Continue MagicCup TID with meals, each supplement provides 290 kcal and 9 grams of protein  - Continue ProSource Plus 30 ml po BID, each supplement provides 100 kcal and 15 grams of protein  - RD provided diet education and "High Calorie, High Protein Nutrition Therapy" handout from the Academy of Nutrition and Dietetics  - Continue to encourage PO intake  NUTRITION DIAGNOSIS:   Severe Malnutrition related to acute illness (elective AAA reapir and right common femoral artery aneurysm repair) as evidenced by moderate fat depletion,severe muscle depletion,percent weight loss (7.4% weight loss in 2.5 months).  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 Presented on 04/17/20 for elective AAA repair as well as right common femoral artery aneurysm repair. A Cortrak was placed for nutrition support as well as hydration. Pt did require intubation with ventilatory support due to respiratory distress 05/04/20 with tracheostomy performed 05/09/20. 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 Admitted to CIR on 05/30/20.  12/05 - nocturnal TF decreased by MD to stimulate appetite 12/07 - MD d/c nocturnal TF 12/08 - decannulated, Cortrak removed  Noted target d/c date of 12/22.  Spoke with pt and wife at bedside. Both pt and wife feel that pt is  eating better at meals. [name] thinks that pt will eat better when he is home, and pt agrees. Provided diet education and handout regarding high-calorie, high-protein nutrition. [name] appreciative.  Pt consuming Boost Breeze occasionally. [name] trying to get pt to drink from one at time of visit. Discussed with pt the importance of continuing with supplement intake to meet increased kcal and protein needs.  Meal Completion: 25-80%  Medications reviewed and include: ProSource Plus BID, questran BID, Boost Breeze TID, nutrisource fiber BID, megace 400 mg BID IVF: 1/2NS @ 75 ml/hr  Labs reviewed: BUN 30  Diet Order:   Diet Order            Diet regular Room service appropriate? Yes; Fluid consistency: Thin  Diet effective 1000                 EDUCATION NEEDS:   Education needs have been addressed  Skin:  Skin Assessment: Skin Integrity Issues: Other: MASD to buttocks  Last BM:  06/19/20 type 6  Height:   Ht Readings from Last 1 Encounters:  05/31/20 6\' 3"  (1.905 m)    Weight:   Wt Readings from Last 1 Encounters:  06/17/20 81.8 kg    BMI:  Body mass index is 22.54 kg/m.  Estimated Nutritional Needs:   Kcal:  2200-2400  Protein:  120-145 grams  Fluid:  >/= 2.2 L    Gustavus Bryant, MS, RD, LDN Inpatient Clinical Dietitian Please see AMiON for contact information.

## 2020-06-19 NOTE — Progress Notes (Signed)
Update sleep chart: Pt slept from 0000am -6am. Pt had intermittent sleep from 1900-2300pm last night. Pt in no acute distress. RN will continue to monitor.

## 2020-06-19 NOTE — Progress Notes (Signed)
Occupational Therapy Session Note  Patient Details  Name: Carlos Olson MRN: [id number] Date of Birth: 02-17-1941  Today's Date: 06/19/2020 OT Individual Time: 4709-6283 OT Individual Time Calculation (min): 55 min   Short Term Goals: Week 3:  OT Short Term Goal 1 (Week 3): LTG=STG 2/2 ELOS  Skilled Therapeutic Interventions/Progress Updates:    Pt very slow to arouse this morning, requiring multiple tactile and verbal cues, along with environmental changes. Pt noted to be saturated with urine and BM in brief and pt unaware. Pt's brief was too full to get to the bathroom, so peri-care and brief change completed in bed. Pt able to roll L with supervision and min A to the R. Pt completed bed mobility with verbal cues and min A to elevate trunk. Dressing tasks completed from seated EOB with mod instructional cues and much more than reasonable amount of time, overall min A. Sit<>stand from EOB with CGA, then worked on alternating UEs to pull up pants, min A to get them up. Pt needed multiple rest breaks within BADL tasks. UB dressing from EOB with mod instructional cues and min A to find correct arm and head holes. Pt ambulated to wc placed at door with RW and CGA. Pt left seated in wc with Respiratory Therapist there for neb treatment, alarm belt on, and call bell in reach.   Therapy Documentation Precautions:  Precautions Precautions: Fall Precaution Comments: trach, flexiseal Restrictions Weight Bearing Restrictions: No Pain:  denies pain  Therapy/Group: Individual Therapy  Valma Cava 06/19/2020, 8:29 AM

## 2020-06-19 NOTE — Plan of Care (Signed)
Problem: RH Balance °Goal: LTG: Patient will maintain dynamic sitting balance (OT) °Description: LTG:  Patient will maintain dynamic sitting balance with assistance during activities of daily living (OT) °Outcome: Completed/Met °Goal: LTG Patient will maintain dynamic standing with ADLs (OT) °Description: LTG:  Patient will maintain dynamic standing balance with assist during activities of daily living (OT)  °Outcome: Completed/Met °  °Problem: Sit to Stand °Goal: LTG:  Patient will perform sit to stand in prep for activites of daily living with assistance level (OT) °Description: LTG:  Patient will perform sit to stand in prep for activites of daily living with assistance level (OT) °Outcome: Completed/Met °  °Problem: RH Eating °Goal: LTG Patient will perform eating w/assist, cues/equip (OT) °Description: LTG: Patient will perform eating with assist, with/without cues using equipment (OT) °Outcome: Completed/Met °  °Problem: RH Grooming °Goal: LTG Patient will perform grooming w/assist,cues/equip (OT) °Description: LTG: Patient will perform grooming with assist, with/without cues using equipment (OT) °Outcome: Completed/Met °  °Problem: RH Bathing °Goal: LTG Patient will bathe all body parts with assist levels (OT) °Description: LTG: Patient will bathe all body parts with assist levels (OT) °Outcome: Completed/Met °  °Problem: RH Dressing °Goal: LTG Patient will perform upper body dressing (OT) °Description: LTG Patient will perform upper body dressing with assist, with/without cues (OT). °Outcome: Completed/Met °Goal: LTG Patient will perform lower body dressing w/assist (OT) °Description: LTG: Patient will perform lower body dressing with assist, with/without cues in positioning using equipment (OT) °Outcome: Completed/Met °  °Problem: RH Toileting °Goal: LTG Patient will perform toileting task (3/3 steps) with assistance level (OT) °Description: LTG: Patient will perform toileting task (3/3 steps) with  assistance level (OT)  °Outcome: Completed/Met °  °Problem: RH Functional Use of Upper Extremity °Goal: LTG Patient will use RT/LT upper extremity as a (OT) °Description: LTG: Patient will use right/left upper extremity as a stabilizer/gross assist/diminished/nondominant/dominant level with assist, with/without cues during functional activity (OT) °Outcome: Completed/Met °  °Problem: RH Toilet Transfers °Goal: LTG Patient will perform toilet transfers w/assist (OT) °Description: LTG: Patient will perform toilet transfers with assist, with/without cues using equipment (OT) °Outcome: Completed/Met °  °Problem: RH Tub/Shower Transfers °Goal: LTG Patient will perform tub/shower transfers w/assist (OT) °Description: LTG: Patient will perform tub/shower transfers with assist, with/without cues using equipment (OT) °Outcome: Completed/Met °  °Problem: RH Attention °Goal: LTG Patient will demonstrate this level of attention during functional activites (OT) °Description: LTG:  Patient will demonstrate this level of attention during functional activites  (OT) °Outcome: Completed/Met °  °Problem: RH Awareness °Goal: LTG: Patient will demonstrate awareness during functional activites type of (OT) °Description: LTG: Patient will demonstrate awareness during functional activites type of (OT) °Outcome: Completed/Met °  °

## 2020-06-19 NOTE — Progress Notes (Signed)
Physical Therapy Discharge Summary  Patient Details  Name: Carlos Olson MRN: [id number] Date of Birth: 1941/04/30  Today's Date: 06/19/2020 PT Individual Time: 1432-1530 PT Individual Time Calculation (min): 58 min    Patient has met 10 of 14 long term goals due to improved activity tolerance, improved balance, improved postural control and increased strength.  Patient to discharge at an ambulatory level Supervision.   Patient's care partner is independent to provide the necessary physical and cognitive assistance at discharge.  Reasons goals not met: Pt progress slow and fluctuatin depending on fatigue levels and cognition. Pt's family hiring private aide and pt to have 24/7 care so mobility is at safe level for DC home.  Recommendation:  Patient will benefit from ongoing skilled PT services in home health setting to continue to advance safe functional mobility, address ongoing impairments in strength, bed mobility, balance, transfers, ambulation, and minimize fall risk.  Equipment: 18" x18" Manual WC  Reasons for discharge: treatment goals met and discharge from hospital  Patient/family agrees with progress made and goals achieved: Yes   Skilled Therapeutic Interventions: Pt received supine in bed and agrees to therapy. No complaint of pain. Supine to sit with use of bed features and minA with cues on logrolling technique and body mechanics. Stand pivot transfer from bed to [facility] with RW and supervision with cues on hand placement and sequencing. WC transport to gym for time management. Pt performs car transfer with RW and supervision and cues on sequencing. Pt ambulates 100' with RW and close supervision. PT cues for upright gaze to improve posture and balance, increasing gait speed and stride width due to scissoring pattern and to decrease risk for falls. Pt verbalizes that he started having bowel movement during ambulation. Stand step transfer to toilet with CGA. Pt is partially  continent of bowel and performs initiation of pericare independently, but requires PT assist for thoroughness. Stand step transfer back to [facility] with CGA. Pt ambulates additional bout of 81' with RW and same assist. Pt verbalizes significant fatigue and requests to return to room. Stand pivot back to bed with supervision and minA for sit to supine. Pt left supine in bed with alarm intact nd all needs within reach.  PT Discharge Precautions/Restrictions Precautions Precautions: Fall Restrictions Weight Bearing Restrictions: No Vision/Perception  Perception Perception: Within Functional Limits Praxis Praxis: Impaired Praxis Impairment Details: Motor planning  Cognition Overall Cognitive Status: Impaired/Different from baseline Arousal/Alertness: Awake/alert Orientation Level: Oriented to person;Oriented to place Safety/Judgment: Impaired Sensation Sensation Light Touch: Impaired by gross assessment Additional Comments: decrased L UE compared to R to light touch- daughter says its baseline from previous neck surgery Coordination Gross Motor Movements are Fluid and Coordinated: No Fine Motor Movements are Fluid and Coordinated: No Coordination and Movement Description: decreased smoothness and accuracy on L side Finger Nose Finger Test: dysmetria on L side Motor  Motor Motor: Motor apraxia  Mobility Bed Mobility Bed Mobility: Supine to Sit;Sit to Supine Supine to Sit: Minimal Assistance - Patient > 75% Sit to Supine: Minimal Assistance - Patient > 75% Transfers Transfers: Sit to Stand;Stand to Sit;Stand Pivot Transfers Sit to Stand: Supervision/Verbal cueing Stand to Sit: Supervision/Verbal cueing Stand Pivot Transfers: Supervision/Verbal cueing Stand Pivot Transfer Details: Verbal cues for technique;Verbal cues for safe use of DME/AE;Verbal cues for sequencing Transfer (Assistive device): Rolling walker Locomotion  Gait Ambulation: Yes Gait Assistance: Supervision/Verbal  cueing Gait Distance (Feet): 100 Feet Assistive device: Rolling walker Gait Assistance Details: Verbal cues for safe use of DME/AE;Verbal  cues for precautions/safety;Verbal cues for technique Gait Gait: Yes Gait Pattern: Impaired Gait Pattern: Decreased dorsiflexion - right;Decreased dorsiflexion - left;Scissoring;Shuffle;Trunk flexed Stairs / Additional Locomotion Stairs: No Wheelchair Mobility Wheelchair Mobility: No  Trunk/Postural Assessment  Cervical Assessment Cervical Assessment: Exceptions to [facility] (forwrad head) Thoracic Assessment Thoracic Assessment: Exceptions to [facility] (rounded shoulders) Lumbar Assessment Lumbar Assessment: Exceptions to [facility] (posterior pelvic tilt) Postural Control Postural Control: Deficits on evaluation Trunk Control: Decresaed core strength Protective Responses: delayed  Balance Balance Balance Assessed: Yes Static Sitting Balance Static Sitting - Level of Assistance: 5: Stand by assistance Dynamic Sitting Balance Dynamic Sitting - Level of Assistance: 5: Stand by assistance Static Standing Balance Static Standing - Balance Support: Bilateral upper extremity supported Static Standing - Level of Assistance: 5: Stand by assistance Dynamic Standing Balance Dynamic Standing - Balance Support: During functional activity;Bilateral upper extremity supported Dynamic Standing - Level of Assistance: 5: Stand by assistance Extremity Assessment  RLE Assessment RLE Assessment: Exceptions to [facility] Active Range of Motion (AROM) Comments: WFL General Strength Comments: Grossly 4/5 LLE Assessment LLE Assessment: Exceptions to [facility] Active Range of Motion (AROM) Comments: [facility] General Strength Comments: Grossly WFL    Breck Coons, PT, DPT 06/19/2020, 3:38 PM

## 2020-06-19 NOTE — Patient Care Conference (Signed)
Inpatient RehabilitationTeam Conference and Plan of Care Update Date: 06/19/2020   Time: 10:38 AM    Patient Name: Carlos Olson      Medical Record Number: [id number]  Date of Birth: [birth year] Sex: Male         Room/Bed: 4W19C/4W19C-01 Payor Info: Payor: [employer] MEDICARE / Plan: [employer] MEDICARE HMO/PPO / Product Type: *No Product type* /    Admit Date/Time:  05/30/2020  6:28 PM  Primary Diagnosis:  [facility] Problems: Principal Problem:   Debility Active Problems:   Tracheostomy present ([place])   Moderate hypoxic-ischemic encephalopathy    Expected Discharge Date: Expected Discharge Date: 06/20/20  Team Members Present: Physician leading conference: Dr. Alger Simons Care Coodinator Present: Loralee Pacas, LCSWA;Dakoda Bassette Creig Hines, RN, BSN, [place] Nurse Present: Dwaine Gale, RN PT Present: Tereasa Coop, PT OT Present: Cherylynn Ridges, OT SLP Present: Weston Anna, SLP PPS Coordinator present : Gunnar Fusi, SLP     Current Status/Progress Goal Weekly Team Focus  Bowel/Bladder   Incontinent B/B, blood in urine, LBM 12/20  pt will regain continence and blood in urine will diminish throughout stay.  bowl regimen q2h, morning labs to monitor hgb   Swallow/Nutrition/ Hydration   Regular textures with thin liquids, Mod I for use of strategies  Mod I  Family Education   ADL's   Min A overall  Supervision/CGA  self-care retraining, activity tolerance, pt/family ed   Mobility   minA bed mobility, CGA sit to stand, gait up to 150' with RW  Supervision  DC prep   Communication             Safety/Cognition/ Behavioral Observations  Min A  Min A-Supervision  Family Education   Pain   denies pain  Pt will remain free from pain. Pain goal is less than 3  assess pain q shift and give Prn meds with pain   Skin   bottom red-barrier cream with foam pad; skin intact-nonblanchable  Pt will remain free from skin breakdown  assess skin per shift, incontinence care (barrier  cream, foam pad) q2 turns     Discharge Planning:  D/c to home with 24/7 care from wife, dtr and son in law. HHPT/OT/SLP/aide/SN with [facility]. Family edu completed. Dtr is working on Retail banker to help assist.   Team Discussion: Stools are improving, mostly continent B/B, lidocaine patch for chronic pain. IVF only at night. OT reports patient is apraxic, confused, and worse in the afternoon. PT reports patient's performance fluctuates depends on confusion and time of day. SLP reports family education is complete and patient is ready from their end. Patient on target to meet rehab goals: yes, daughter is aware of the level he will be going home at and is okay with that.  *See Care Plan and progress notes for long and short-term goals.   Revisions to Treatment Plan:  Continue IVF at night until discharge.  Teaching Needs: Continue family education Education on skin care Pain management  Current Barriers to Discharge: Decreased caregiver support, Home enviroment access/layout, Wound care, Behavior and Nutritional means  Possible Resolutions to Barriers: Continue current medications, offer nutritional support, eduction on wound and skin care, pain management, provide emotional support to family and patient.     Medical Summary Current Status: improving stool consistency. breathing improving.still confused, likely some anoxic injury. cognition waxes and wanes  Barriers to Discharge: Medical stability   Possible Resolutions to Barriers/Weekly Focus: nutrition, renal function, cognitive/sleep mgt, bulking up stools  Continued Need for Acute Rehabilitation Level of Care: The patient requires daily medical management by a physician with specialized training in physical medicine and rehabilitation for the following reasons: Direction of a multidisciplinary physical rehabilitation program to maximize functional independence : Yes Medical management of patient stability for  increased activity during participation in an intensive rehabilitation regime.: Yes Analysis of laboratory values and/or radiology reports with any subsequent need for medication adjustment and/or medical intervention. : Yes   I attest that I was present, lead the team conference, and concur with the assessment and plan of the team.   Cristi Loron 06/19/2020, 4:52 PM

## 2020-06-19 NOTE — Progress Notes (Signed)
[place] PHYSICAL MEDICINE & REHABILITATION PROGRESS NOTE   Subjective/Complaints:  Doesn't report any issues last night. Had some cough per nursing and IVF was held.  Denies any SOB this morning, occasional morning cough as he's had  ROS: Patient denies fever, rash, sore throat, blurred vision, nausea, vomiting, diarrhea,  shortness of breath or chest pain,  headache, or mood change.      Objective:   No results found. Recent Labs    06/18/20 0737  WBC 9.2  HGB 10.2*  HCT 33.1*  PLT 284   Recent Labs    06/18/20 0737  NA 138  K 4.0  CL 115*  CO2 13*  GLUCOSE 96  BUN 30*  CREATININE 1.03  CALCIUM 9.7    Intake/Output Summary (Last 24 hours) at 06/19/2020 1212 Last data filed at 06/19/2020 0149 Gross per 24 hour  Intake 1216.02 ml  Output --  Net 1216.02 ml        Physical Exam: Vital Signs Blood pressure 102/62, pulse 100, temperature 98 F (36.7 C), temperature source Oral, resp. rate (!) 22, height 6\' 3"  (1.905 m), weight 81.8 kg, SpO2 95 %. Constitutional: No distress . Vital signs reviewed. HEENT: EOMI, oral membranes moist Neck: supple Cardiovascular: RRR without murmur. No JVD    Respiratory/Chest: CTA Bilaterally without wheezes or rales. Normal effort    GI/Abdomen: BS +, non-tender, non-distended Ext: no clubbing, cyanosis, or edema Psych: pleasant and cooperative, fairly appropriate this morning. Knew that tomorrow was his dc date but needed some verbal cueing about actual date. Used calendar to help himself. Skin: Buttock redness.  Trach stoma closed with scab Musculoskeletal: Full ROM, No pain with AROM or PROM in the neck, trunk, or extremities. Neuro: fairly alert. Oriented to person, hospital  [place] 4/5 BLE, sensation intact, tone is normal, LUE 3-4/5--stable     Assessment/Plan: 1. Functional deficits which require 3+ hours per day of interdisciplinary therapy in a comprehensive inpatient rehab setting.  Physiatrist is providing  close team supervision and 24 hour management of active medical problems listed below.  Physiatrist and rehab team continue to assess barriers to discharge/monitor patient progress toward functional and medical goals  Care Tool:  Bathing  Bathing activity did not occur: Safety/medical concerns Body parts bathed by patient: Left arm,Right arm,Chest,Abdomen,Face,Front perineal area,Right upper leg,Buttocks,Left lower leg,Right lower leg,Left upper leg   Body parts bathed by helper: Right lower leg,Left lower leg,Buttocks     Bathing assist Assist Level: Contact Guard/Touching assist     Upper Body Dressing/Undressing Upper body dressing   What is the patient wearing?: Pull over shirt    Upper body assist Assist Level: Minimal Assistance - Patient > 75%    Lower Body Dressing/Undressing Lower body dressing      What is the patient wearing?: Pants     Lower body assist Assist for lower body dressing: Minimal Assistance - Patient > 75%     Toileting Toileting Toileting Activity did not occur (Clothing management and hygiene only): N/A (no void or bm) (patient has a rectal tube in- no void)  Toileting assist Assist for toileting: Minimal Assistance - Patient > 75%     Transfers Chair/bed transfer  Transfers assist  Chair/bed transfer activity did not occur: Safety/medical concerns  Chair/bed transfer assist level: Contact Guard/Touching assist Chair/bed transfer assistive device: Programmer, multimedia   Ambulation assist      Assist level: Contact Guard/Touching assist Assistive device: Walker-rolling Max distance: 120'   Walk 10  feet activity   Assist  Walk 10 feet activity did not occur: Safety/medical concerns  Assist level: Contact Guard/Touching assist Assistive device: Walker-rolling   Walk 50 feet activity   Assist Walk 50 feet with 2 turns activity did not occur: Safety/medical concerns  Assist level: Contact Guard/Touching  assist Assistive device: Walker-rolling    Walk 150 feet activity   Assist Walk 150 feet activity did not occur: Safety/medical concerns  Assist level: Minimal Assistance - Patient > 75% Assistive device: Walker-rolling    Walk 10 feet on uneven surface  activity   Assist Walk 10 feet on uneven surfaces activity did not occur: Safety/medical concerns         Wheelchair     Assist Will patient use wheelchair at discharge?: Yes Type of Wheelchair: Manual    Wheelchair assist level: Minimal Assistance - Patient > 75% Max wheelchair distance: 20'    Wheelchair 50 feet with 2 turns activity    Assist        Assist Level: Maximal Assistance - Patient 25 - 49%   Wheelchair 150 feet activity     Assist      Assist Level: Total Assistance - Patient < 25%   Blood pressure 102/62, pulse 100, temperature 98 F (36.7 C), temperature source Oral, resp. rate (!) 22, height 6\' 3"  (1.905 m), weight 81.8 kg, SpO2 95 %.  Medical Problem List and Plan: 1.  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   -team recs [place] f/u 2.  Antithrombotics: -DVT/anticoagulation: Eliquis             -antiplatelet therapy: N/A 3. Pain Management: Lidoderm patch only.    -no narcs d/t delirium  -tylenol prn when needed             Monitor with increased exertion 4. Mood: Provide emotional support             -antipsychotic agents: continue seroquel at 50mg  QHS    -continue sleep chart  12/21 sleep better with 50mg  seroquel, not always consistent though 5. Neuropsych: This patient is not fully capable of making decisions on his own behalf. Neuropsych eval today, appears encephalopathic , he misttook his  daughter for wife during OT session 12/5 Neuropsych eval   -see above. Pt had baseline cognitive deficits  -12/20-21 cognitive status waxes and wanes--I suspect he'll be better once home in familiar environment. I also suspect he suffered some anoxic injury during his critical illness 6. Skin/Wound Care: Routine skin checks  -EPBC for buttocks 7. Fluids/Electrolytes/Nutrition:               CMP reviewed, albumin low, ongoing AKI  -added megace for appetite--eats 25-50%. Not a big eater per daughter  -continue protein supps  12/20 intake overall improved   -encouraging PO fluids   -added HS IVF to supplement over weekend   -f/u BMET today improved (bun 30)   -continue IVF at night until he goes home.   8.  Tracheostomy   12/9 trach dc'ed 12/8-stoma closing  -intermittent cough    -added mucinex   -  continue nebs   -cough mostly in early AM   -improved 12/17  12/18-   tessalon prn for cough 9.  AKI.  Improved.  Renal ultrasound no hydronephrosis.   10.  Hypotension.               Monitor with increased mobility. 11.  Atrial fibrillation.  Lopressor 50 mg twice daily, continue Eliquis.  Cardiac rate controlled.   controlled 12/9 12.  Acute on chronic anemia.                         Hgb trending up to 9.7 --10.2 on 12/11   -recheck Monday  13.  COPD with remote history of tobacco abuse.  Continue nebulizers as directed 14.  Hyperlipidemia.  Lipitor 15.  Prostate cancer with prostatectomy 1992.  Follow-up outpatient 16.  Colovesical fistula.    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  Follow-up urology as well as gastroenterology services.  Patient currently declined endoscopy. At this point, it doesn't appear that there is vesicular component to fistula/loose stool  -increased questran to bid, continue bid fiber  12/11:daughter concerned about discomfort of rectal tube and asked to have it removed. She is aware of the risks of skin breakdown and importance of regular cleaning.   12/12: feels more  comfortable without rectal tube.   12/13 spoke with urology/GI. No imminent plans for f/u given that he's passing clear, uncontaminated urine and is not a candidate for surgery, further intervention.   12/20-21 plan remains  to dc home with rectal tube or keeping patient as clean and as dry as possible given loose stool   -continue EPBC for buttocks   -pt having more formed stools (typically mushy now) 17. Dysphagia: D3 diet, tolerating  18. Runny nose  12/18- ordered flonase daily     LOS: 20 days A FACE TO [place] 06/19/2020, 12:12 PM

## 2020-06-19 NOTE — Progress Notes (Signed)
Occupational Therapy Discharge Summary  Patient Details  Name: Carlos Olson MRN: [id number] Date of Birth: Nov 11, 1940  Today's Date: 06/19/2020 OT Individual Time: 1115-1145 OT Individual Time Calculation (min): 30 min   Patient greeted asleep in bed with wife present. Pt was difficult to arouse initially, but eventually able to get him moving. Min A for bed mobility, then pt agreeable to ambulate into bathroom w/ RW and CGA. Pt with continent void of bloody urine and BM. Pt was able to complete toileting with CGA. Pt took seated rest break after standing to toilet back on BSC with seat down. Pt then ambulated out of bathroom in similar fashion and tolerated standing to wash his hands before needing to sit. OT provided pt and spouse with home fine motor program and theraputty exercises. Reviewed handouts with pt and family. Pt left seated in wc with alarm belt on, call bell in reach, and needs met.   Patient has met 14 of 14 long term goals due to improved activity tolerance, improved balance, postural control, ability to compensate for deficits, functional use of  LEFT upper and LEFT lower extremity, improved attention, improved awareness and improved coordination.  Patient to discharge at [facility] Assist level.  Patient's care partner is independent to provide the necessary physical and cognitive assistance at discharge.    Reasons goals not met: n/a  Recommendation:  Patient will benefit from ongoing skilled OT services in home health setting to continue to advance functional skills in the area of BADL, Reduce care partner burden and functional use of L UE.  Equipment: 3-in-1 BSC, wc  Reasons for discharge: treatment goals met and discharge from hospital  Patient/family agrees with progress made and goals achieved: Yes  OT Discharge Pain  denies pain ADL ADL Eating: Supervision/safety Grooming: Supervision/safety Where Assessed-Grooming: Sitting at sink Upper Body Bathing:  Supervision/safety Where Assessed-Upper Body Bathing: Sitting at sink Lower Body Bathing: Contact guard Where Assessed-Lower Body Bathing: Sitting at sink,Standing at sink Upper Body Dressing: Minimal assistance Where Assessed-Upper Body Dressing: Sitting at sink Lower Body Dressing: Minimal assistance Where Assessed-Lower Body Dressing: Sitting at sink,Standing at sink Toileting: Minimal assistance Toilet Transfer: Contact guard Toilet Transfer Method: Counselling psychologist: Raised toilet seat Tub/Shower Transfer: Unable to assess Cognition Overall Cognitive Status: Impaired/Different from baseline Arousal/Alertness: Lethargic Orientation Level: Oriented X4 Attention: Selective Sustained Attention: Impaired Sustained Attention Impairment: Verbal basic;Functional basic Selective Attention: Impaired Selective Attention Impairment: Verbal basic;Functional basic Memory: Impaired Memory Impairment: Retrieval deficit;Decreased recall of new information;Decreased short term memory Decreased Short Term Memory: Functional basic;Verbal basic Awareness: Impaired Awareness Impairment: Emergent impairment Problem Solving: Impaired Problem Solving Impairment: Verbal basic;Functional basic Safety/Judgment: Impaired Sensation Sensation Additional Comments: decrased L UE compared to R to light touch- daughter says its baseline from previous neck surgery Coordination Gross Motor Movements are Fluid and Coordinated: No Fine Motor Movements are Fluid and Coordinated: No Coordination and Movement Description: decreased smoothness and accuracy on L side Finger Nose Finger Test: dysmetria on L side Motor  Motor Motor: Motor apraxia Mobility  Bed Mobility Supine to Sit: Minimal Assistance - Patient > 75% Transfers Sit to Stand: Contact Guard/Touching assist Stand to Sit: Contact Guard/Touching assist  Balance Static Sitting Balance Static Sitting - Level of Assistance: 5:  Stand by assistance Dynamic Sitting Balance Dynamic Sitting - Level of Assistance: 5: Stand by assistance Dynamic Standing Balance Dynamic Standing - Level of Assistance: 4: Min assist (CGA) Extremity/Trunk Assessment RUE Assessment RUE Assessment: Within Functional Limits LUE Assessment  LUE Assessment: Exceptions to [facility] General Strength Comments: Generalized weakness, deficits more in coordination and motor planning on L side   Daneen Schick Amneet Cendejas 06/19/2020, 9:08 AM

## 2020-06-20 NOTE — Progress Notes (Signed)
RN notified charge nurse of no 0.45 NS fluid bags available. Charge nurse confirmed that the order was put in to supply. RN will inform day shift nurse upon arrival.

## 2020-06-20 NOTE — Progress Notes (Signed)
Inpatient Rehabilitation Care Coordinator  Discharge Note  The overall goal for the admission was met for:   Discharge location: Yes. D/c to home with 24/7 care from wife, and dtr/son-in law intermittently. Working on Conservation officer, nature a Designer, industrial/product.   Length of Stay: Yes. 20 days.   Discharge activity level: Yes. Supervision; 24/7 care required due to mobility and cognition.  Home/community participation: Yes. Limited.   Services provided included: MD, RD, PT, OT, SLP, RN, CM, TR, Pharmacy, Neuropsych and SW  Financial Services: Private Insurance: Airline pilot Medicare  Follow-up services arranged: Home Health: [facility] for HHPT/OT/SLP/SN/aide, DME: [place] for w/c and 3in1 BSC and Patient/Family request agency HH: [facility]- all declined, DME: N/A  Comments (or additional information): contact pt dtr [name] [phone]  Patient/Family verbalized understanding of follow-up arrangements: Yes  Individual responsible for coordination of the follow-up plan: Pt to have assistance with coordinating care needs.   Confirmed correct DME delivered: Rana Snare 06/20/2020    Rana Snare

## 2020-06-20 NOTE — Progress Notes (Signed)
Patient ID: Carlos Olson, male   DOB: 11/10/1940, 79 y.o.   MRN: [id number]  SW met with pt, pt dtr, and son-in-law in room to review d/c. Dtr [name] reports she has received contact from [place] [facility], and they intend to call her before coming out on 12/23. No other questions/concerns reported.   Loralee Pacas, MSW, [place] Office: [phone] Cell: [phone] Fax: [phone]

## 2020-06-20 NOTE — Progress Notes (Signed)
Pt is to be discharged today. Sleep chart update: Pt slept from 2200pm -0700am with minimal interruptions. Pt stated last night, "I am going to sleep through the night so I can wake up at home." RN will continue to monitor.

## 2020-06-20 NOTE — Discharge Instructions (Signed)
Inpatient Rehab Discharge Instructions  Carlos Olson Discharge date and time: No discharge date for patient encounter.   Activities/Precautions/ Functional Status: Activity: activity as tolerated Diet: regular Wound Care: Routine skin checks Functional status:  ___ No restrictions     ___ Walk up steps independently ___ 24/7 supervision/assistance   ___ Walk up steps with assistance ___ Intermittent supervision/assistance  ___ Bathe/dress independently ___ Walk with walker     _x__ Bathe/dress with assistance ___ Walk Independently    ___ Shower independently ___ Walk with assistance    ___ Shower with assistance ___ No alcohol     ___ Return to work/school ________   COMMUNITY REFERRALS UPON DISCHARGE:    Home Health:   PT     OT     ST   RN   SNA                 Agency:[facility] Phone: [phone] *Please expect follow-up within 2-3 days to schedule your home visit. If you have not received follow-up, be sure to contact the branch directly.*   Medical Equipment/Items Ordered: wheelchair and 3in1 bedside commode                                                 Agency/Supplier: [employer] [phone]    Special Instructions: No driving smoking or alcohol   Close monitoring of skin for any breakdown   My questions have been answered and I understand these instructions. I will adhere to these goals and the provided educational materials after my discharge from the hospital.  Patient/Caregiver Signature _______________________________ Date __________  Clinician Signature _______________________________________ Date __________  Please bring this form and your medication list with you to all your follow-up doctor's appointments.     Information on my medicine - ELIQUIS (apixaban)  Why was Eliquis prescribed for you? Eliquis was prescribed for you to reduce the risk of forming blood clots that can cause a stroke if you have a medical  condition called atrial fibrillation (a type of irregular heartbeat) OR to reduce the risk of a blood clots forming after orthopedic surgery.  What do You need to know about Eliquis ? Take your Eliquis TWICE DAILY - one tablet in the morning and one tablet in the evening with or without food.  It would be best to take the doses about the same time each day.  If you have difficulty swallowing the tablet whole please discuss with your pharmacist how to take the medication safely.  Take Eliquis exactly as prescribed by your doctor and DO NOT stop taking Eliquis without talking to the doctor who prescribed the medication.  Stopping may increase your risk of developing a new clot or stroke.  Refill your prescription before you run out.  After discharge, you should have regular check-up appointments with your healthcare provider that is prescribing your Eliquis.  In the future your dose may need to be changed if your kidney function or weight changes by a significant amount or as you get older.  What do you do if you miss a dose? If you miss a dose, take it as soon as you remember on the same day and resume taking twice daily.  Do not take more than one dose of ELIQUIS at the same time.  Important Safety Information A possible side effect of  Eliquis is bleeding. You should call your healthcare provider right away if you experience any of the following: ? Bleeding from an injury or your nose that does not stop. ? Unusual colored urine (red or dark brown) or unusual colored stools (red or black). ? Unusual bruising for unknown reasons. ? A serious fall or if you hit your head (even if there is no bleeding).  Some medicines may interact with Eliquis and might increase your risk of bleeding or clotting while on Eliquis. To help avoid this, consult your healthcare provider or pharmacist prior to using any new prescription or non-prescription medications, including herbals, vitamins, non-steroidal  anti-inflammatory drugs (NSAIDs) and supplements.  This website has more information on Eliquis (apixaban): [email or web address].no.

## 2020-06-21 DIAGNOSIS — M4712 Other spondylosis with myelopathy, cervical region: Secondary | ICD-10-CM | POA: Diagnosis not present

## 2020-06-21 DIAGNOSIS — I700 Atherosclerosis of aorta: Secondary | ICD-10-CM | POA: Diagnosis not present

## 2020-06-21 DIAGNOSIS — M8580 Other specified disorders of bone density and structure, unspecified site: Secondary | ICD-10-CM | POA: Diagnosis not present

## 2020-06-21 DIAGNOSIS — I480 Paroxysmal atrial fibrillation: Secondary | ICD-10-CM | POA: Diagnosis not present

## 2020-06-21 DIAGNOSIS — I6523 Occlusion and stenosis of bilateral carotid arteries: Secondary | ICD-10-CM | POA: Diagnosis not present

## 2020-06-21 DIAGNOSIS — N189 Chronic kidney disease, unspecified: Secondary | ICD-10-CM | POA: Diagnosis not present

## 2020-06-21 DIAGNOSIS — I2510 Atherosclerotic heart disease of native coronary artery without angina pectoris: Secondary | ICD-10-CM | POA: Diagnosis not present

## 2020-06-21 DIAGNOSIS — Z430 Encounter for attention to tracheostomy: Secondary | ICD-10-CM | POA: Diagnosis not present

## 2020-06-21 DIAGNOSIS — M4802 Spinal stenosis, cervical region: Secondary | ICD-10-CM | POA: Diagnosis not present

## 2020-06-21 DIAGNOSIS — I712 Thoracic aortic aneurysm, without rupture: Secondary | ICD-10-CM | POA: Diagnosis not present

## 2020-06-25 ENCOUNTER — Telehealth: Payer: Self-pay | Admitting: Family Medicine

## 2020-06-25 ENCOUNTER — Telehealth: Payer: Self-pay

## 2020-06-25 DIAGNOSIS — Z930 Tracheostomy status: Secondary | ICD-10-CM

## 2020-06-25 NOTE — Telephone Encounter (Signed)
Pt daugther called in due to her dad was just released from the hospital and the hospital told her that a RN would come out to the home for OT and PT, but when she called the Home Health for the RN , she was told that his PCP would have to be the one to order the RN to come out,  he was discharged on 12/22 after 65 of bieng in the hospital and 43 of the days was in ICU.

## 2020-06-25 NOTE — Telephone Encounter (Signed)
Pam with [facility] called requesting VO for skilled nursing--cardiac, pulm evaluation and medication mgmt  1 x a week for 7 wks and 2 PRN visits  Please advise

## 2020-06-25 NOTE — Telephone Encounter (Signed)
Carlos Olson w/ [facility] called requesting verbal orders for speech therapy 1 x a week for 2 wks, 2 x a week for 2 weeks for cognitive deficit   Please advise

## 2020-06-25 NOTE — Telephone Encounter (Signed)
Lvm for [place] (?) Mitchell to call back.  Need to inform her Dr. Darnell Level is giving verbal orders she requested for pt.

## 2020-06-25 NOTE — Telephone Encounter (Signed)
Agree with this. Thank you.  

## 2020-06-26 ENCOUNTER — Telehealth: Payer: Self-pay

## 2020-06-26 NOTE — Telephone Encounter (Signed)
AClovis Riley advised of the orders.

## 2020-06-26 NOTE — Telephone Encounter (Signed)
Agree with this. Thank you.  

## 2020-06-26 NOTE — Telephone Encounter (Signed)
Agree with this.  

## 2020-06-26 NOTE — Telephone Encounter (Signed)
Spoke with Ventura Bruns from [facility] and advised of these orders. She thought this was already ordered by the hospital but just in case I faxed it over to [phone] for them to add this service

## 2020-06-26 NOTE — Progress Notes (Addendum)
Returned phone call to the daughter of Carlos Olson. She had a question about his trach clinic appointment on Jan 5th   She informed me that the trach was taken out at rehab and their home health nursing staff has assessed the site.  She states the site I healing well and he is doing well.   Trach clinic will be canceled and she will call and cancel the covid screening appointment.

## 2020-06-26 NOTE — Telephone Encounter (Signed)
1) Pt daughter also had a question about Seroquel 50 mg that patient is on at bedtime. She states that pt has been acting like he is in comatose or like "when patient in a come is trying to get out of the sleep," and she wanted to know if she could cut that in half to 25 mg and see if maybe patient will not be as sedated on that dose? Daughter states patient is sensitive to medications.  2) Also, she states she is trying to get patient to drink and eat but it has not been successful 100% so patient gets dehydrated and she found out that fluid IV are not been able to be done and set up in the home anymore, do you know if that is true? Daughter does have 24/7 CNA that will start working with them and help out.  3) Daughter also asked when you able to, she knows you are busy, but if you could take a look at everything that the patient has been through in the notes that you can see in Epic and let her know your thoughts and any extra feedback maybe? She states its just been a lot.

## 2020-06-26 NOTE — Telephone Encounter (Signed)
Eric PT with [facility] left v/m requesting verbal orders for [facility] PT 2 x a wk for 4 wks and 1 x a wk for 4 wks for ambulation, transfers, balance, and home safety.

## 2020-06-26 NOTE — Telephone Encounter (Signed)
Agree with [facility] RN for skilled nursing.  New referral placed.

## 2020-06-26 NOTE — Telephone Encounter (Signed)
Noted  

## 2020-06-27 MED ORDER — MIRTAZAPINE 15 MG PO TABS: 15.0000 mg | ORAL_TABLET | Freq: Every day | ORAL | 3 refills | Status: AC

## 2020-06-27 NOTE — Addendum Note (Signed)
Addended by: Eustaquio Boyden on: 06/27/2020 06:07 PM   Modules accepted: Orders

## 2020-06-27 NOTE — Telephone Encounter (Signed)
Lvm inform Minerva Areola that Dr. Reece Agar is giving verbal orders for services requested.

## 2020-06-27 NOTE — Telephone Encounter (Addendum)
Spoke with daughter Pershing Proud.  Agree with dropping seroquel dose to 25mg  nightly. Melatonin didn't help. Interested in appetite stimulant.  Agree with pushing small sips of fluid throughout the day.  They now have CNA 24/7.   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   rec drop seroquel to 25mg  nightly for 1 week then stop with plan to transition to remeron (mirtazapine) which was sent to pharmacy.

## 2020-06-27 NOTE — Telephone Encounter (Signed)
Spoke with Seward Grater of [facility] informing her Dr. Reece Agar is giving verbal orders for services requested.  Says she will document and inform Pam.

## 2020-06-27 NOTE — Telephone Encounter (Signed)
Pt daugther wanted to know about cutting the seroquel 50mg  down to 25mg .

## 2020-07-02 ENCOUNTER — Telehealth: Payer: Self-pay

## 2020-07-02 ENCOUNTER — Other Ambulatory Visit: Payer: Self-pay

## 2020-07-02 ENCOUNTER — Telehealth: Payer: Self-pay | Admitting: Family Medicine

## 2020-07-02 ENCOUNTER — Other Ambulatory Visit (HOSPITAL_COMMUNITY): Payer: Medicare HMO

## 2020-07-02 DIAGNOSIS — J9621 Acute and chronic respiratory failure with hypoxia: Secondary | ICD-10-CM

## 2020-07-02 DIAGNOSIS — Z8546 Personal history of malignant neoplasm of prostate: Secondary | ICD-10-CM

## 2020-07-02 DIAGNOSIS — R918 Other nonspecific abnormal finding of lung field: Secondary | ICD-10-CM

## 2020-07-02 NOTE — Telephone Encounter (Signed)
Do you need to do an appointment

## 2020-07-02 NOTE — Telephone Encounter (Signed)
Agree with verbal order for hospice if family in agreement.

## 2020-07-02 NOTE — Telephone Encounter (Signed)
[facility] and wanted to know about getting verbal orders for hospice due to the nurse that came out stated that his health was declining, breathing is labored, has not ate in 2 days and color is changing.   Fax Number  [phone]

## 2020-07-02 NOTE — Telephone Encounter (Signed)
[facility] Night - Client Nonclinical Telephone Record  [facility] Client [facility] Night - Client Client Site [facility] - Night Physician Eustaquio Boyden - MD Contact Type Call Call Type Home Care Hospice Page Now Who Is Calling Home Health / Hospice Agency Caller Name [facility] Facility Name [facility] Facility Number [id number] Patient Name Carlos Olson Patient DOB Nov 01, 1940 Reason for Call Symptomatic Patient Initial Comment Caller states she's a Home Health Nurse, and patient has a rash all over. Disp. Time Disposition Final User 06/29/2020 7:33:36 AM Send to [facility] Paging Queue Donnelly Angelica 06/29/2020 7:38:10 AM Called On-Call Provider Doug Sou 06/29/2020 7:38:54 AM Page Completed Yes Doug Sou [facility] Phone DateTime Result/Outcome Message Type Notes Sonda Primes MD [phone] 06/29/2020 7:38:10 AM Called On Call Provider - Reached Doctor Paged Sonda Primes - MD 06/29/2020 7:38:46 AM Spoke with On Call - General Message Result Physician was connected with RN Call Closed By: Doug Sou Transaction Date/Time: 06/29/2020 7:30:43 AM (ET)  Attempted to call Ezequiel Essex, RN back with no answer. Also, attempted to call patient for update with no answer.

## 2020-07-02 NOTE — Telephone Encounter (Signed)
Called spoke to family would like to have done. Spoke to [place] as well faxed information and referral.

## 2020-07-03 NOTE — Telephone Encounter (Signed)
Kendal Hymen with [facility] left v/m needing orders for pt to being hospice care. I called Kendal Hymen with [facility] back and she said she had spoken with family and they had went with a different hospice so Kendal Hymen does not need anything at this time. FYI to Dr Reece Agar.

## 2020-07-03 NOTE — Telephone Encounter (Signed)
Please call for update on body rash.

## 2020-07-03 NOTE — Telephone Encounter (Signed)
[facility] Night - Client Nonclinical Telephone Record [facility] Client [facility] Night - Client Client Site [facility] - Night Physician Eustaquio Boyden - MD Contact Type Call Call Type Home Care Hospice Page Now Who Is Calling Home Health / Hospice Agency Caller Name [facility] Facility Name [facility] Facility Number [phone] Patient Name Carlos Olson Patient DOB 1940/07/06 Reason for Call Request to speak to Physician Initial Comment Kendal Hymen from [facility] who just received a fax to refer a mutual pt marked URGENT. She needs to clarify that Dr Sharen Hones will follow as PCP and other clarifications. Additional Comment Kendal Hymen adds that she will answer this phone # until 6 pm. Fax came too late in the day to admit tonight but is eager to speak to on call and get the orders going. Disp. Time Disposition Final User 07/02/2020 5:24:39 PM Send to [facility] Paging Queue Tomie China 07/02/2020 5:38:35 PM Paged On Call back to Call Center - PC Juel Burrow 07/02/2020 5:39:49 PM Page Completed Yes Juel Burrow [facility] Phone DateTime Result/Outcome Message Type Notes Sonda Primes - MD [phone] 07/02/2020 5:38:35 PM Paged On Call Back to Call Center Doctor Paged Sonda Primes - MD 07/02/2020 5:39:19 PM Spoke with On Call - General Message Result Spoke with on-call and connected with Kendal Hymen. Call Closed By: Juel Burrow Transaction Date/Time: 07/02/2020 5:19:34 PM (ET)

## 2020-07-04 ENCOUNTER — Inpatient Hospital Stay (HOSPITAL_COMMUNITY): Admit: 2020-07-04 | Discharge: 2020-07-04 | Disposition: A | Discharge status: Home / Self Care | Payer: Medicare HMO

## 2020-07-05 ENCOUNTER — Telehealth: Payer: Self-pay

## 2020-07-05 NOTE — Telephone Encounter (Signed)
Noted. Agree to be hospice attending.

## 2020-07-05 NOTE — Telephone Encounter (Signed)
[facility]. Patient was admitted for services on 07/03/2020. Currently receiving symptom management only. They are monitoring patient for few days to see if he will start to ear and drink. He is not at this time will re evaluate to see if any changes.   Family wanted to see if you would agree to be patients attending under hospice care.   Return call to [phone]

## 2020-07-05 NOTE — Telephone Encounter (Signed)
Attempted to reach out to home health nurse, Ezequiel Essex. There was no answer and no option to leave a message. Attempted to reach pt as well, I was unsuccessful.

## 2020-07-06 NOTE — Telephone Encounter (Signed)
Danielle form community stated the patient is  in  a lot of pain, pain scale 8 Currently on Dexamethasone 4mg  BID  Severe reactions to narcotics per the family,  She states he is non verbal she can tell he is very uncomfortable and in severe pain She stated doctor on call will give orders if need be, however since this is your patient they would like to hear from you first  Please advise

## 2020-07-06 NOTE — Telephone Encounter (Addendum)
Call in to hospice, # provided to return my call.  Questions for hospice - where do they think pain is coming from? Is he on scheduled tylenol? Are BMs and UOP normal? Any signs of infection?

## 2020-07-06 NOTE — Telephone Encounter (Addendum)
Spoke with [employer].   She thinks pain is generalized, predominantly in legs.  He is already on dexamethasone 4mg  bid, recent increase to 8mg  bid.  rec start with scheduled tylenol.  Ok to add tramadol 50mg  Q6 PRN and if ineffective, add tylenol #3 for breakthrough pain.  Normal BM, making urine ok.

## 2020-07-06 NOTE — Addendum Note (Signed)
Addended by: Ria Bush on: 07/06/2020 05:28 PM   Modules accepted: Orders

## 2020-07-09 ENCOUNTER — Telehealth: Payer: Self-pay | Admitting: Family Medicine

## 2020-07-09 NOTE — Telephone Encounter (Signed)
Received notice from [place] yesterday about patient's passing at home.  Called family (daughter Gillermina Phy) to express my condolences.  [employer] in [place].  ?who signs death certificate - will touch base with Inez Catalina tomorrow.

## 2020-07-09 NOTE — Telephone Encounter (Signed)
Called pt to get update on rash.  However, pt's wife, Luellen Pucker answered stating pt passed away on 07/30/19.  Offered my condolences.

## 2020-07-10 NOTE — Telephone Encounter (Signed)
Spoke with Carlos Olson hospice RN about death certificate - she will contact funeral home and send me death certificate if needed.

## 2020-07-11 ENCOUNTER — Inpatient Hospital Stay: Payer: Medicare HMO | Admitting: Registered Nurse

## 2020-07-18 NOTE — Telephone Encounter (Signed)
I never received death certificate to fill out. Can we call [facility] [phone] to see if this was taken care of?

## 2020-07-18 NOTE — Telephone Encounter (Addendum)
Spoke with [place] asking about death certificate.  Says it was mailed on 07/12/20.  Asks [place] to let them know if we haven't received in the next couple of days.  They will have a copy dropped off to sign.  FYI to Dr. Darnell Level.

## 2020-07-20 NOTE — Telephone Encounter (Signed)
Filled and in Lisa's box 

## 2020-07-20 NOTE — Telephone Encounter (Signed)
Made 2 copies of certificate and gave everything to [facility].

## 2020-07-23 NOTE — Telephone Encounter (Signed)
Carlos Olson is getting this processed ASAP.

## 2020-07-23 NOTE — Telephone Encounter (Signed)
Placed death certificate in mail to go to health dept per funeral home instructions. Sent copy for scan.

## 2020-07-30 ENCOUNTER — Telehealth: Payer: Self-pay | Admitting: Family Medicine

## 2020-07-30 NOTE — Telephone Encounter (Addendum)
Lvm asking Colletta [place] to call back.  Need clarification of message (since pt is deceased).

## 2020-07-30 NOTE — Telephone Encounter (Signed)
Carlos Olson is calling to followup on a Plan of Care for this patient > Please return her call. EM

## 2020-07-31 NOTE — Telephone Encounter (Signed)
Spoke with Collet[facility] [place] asking about the message.  States she faxed a Plan of Care originally on 1/*4/22, then again on 07/27/20.  She asking to have it completed and faxed back.  Dr. Darnell Level, have you received this?

## 2020-07-31 NOTE — Telephone Encounter (Signed)
I received the second one and it should have been faxed back yesterday - plz call tomorrow to verify received.

## 2020-07-31 DEATH — deceased

## 2020-08-01 NOTE — Telephone Encounter (Signed)
Spoke with Carlos Olson asking about Plan of Care.  Confirms she did receive it.

## 2021-08-04 IMAGING — DX DG CHEST 1V PORT
1 series · 2 of 2 positions shown · non-contrast
Comparison: Portable exam 5732 hours compared to 04/23/2020

CLINICAL DATA: Pneumonia, follow-up

EXAM:
PORTABLE CHEST 1 VIEW

[Series 1: chest · 0.14mm/px · 2 of 2 slices shown]
[im 1/2]
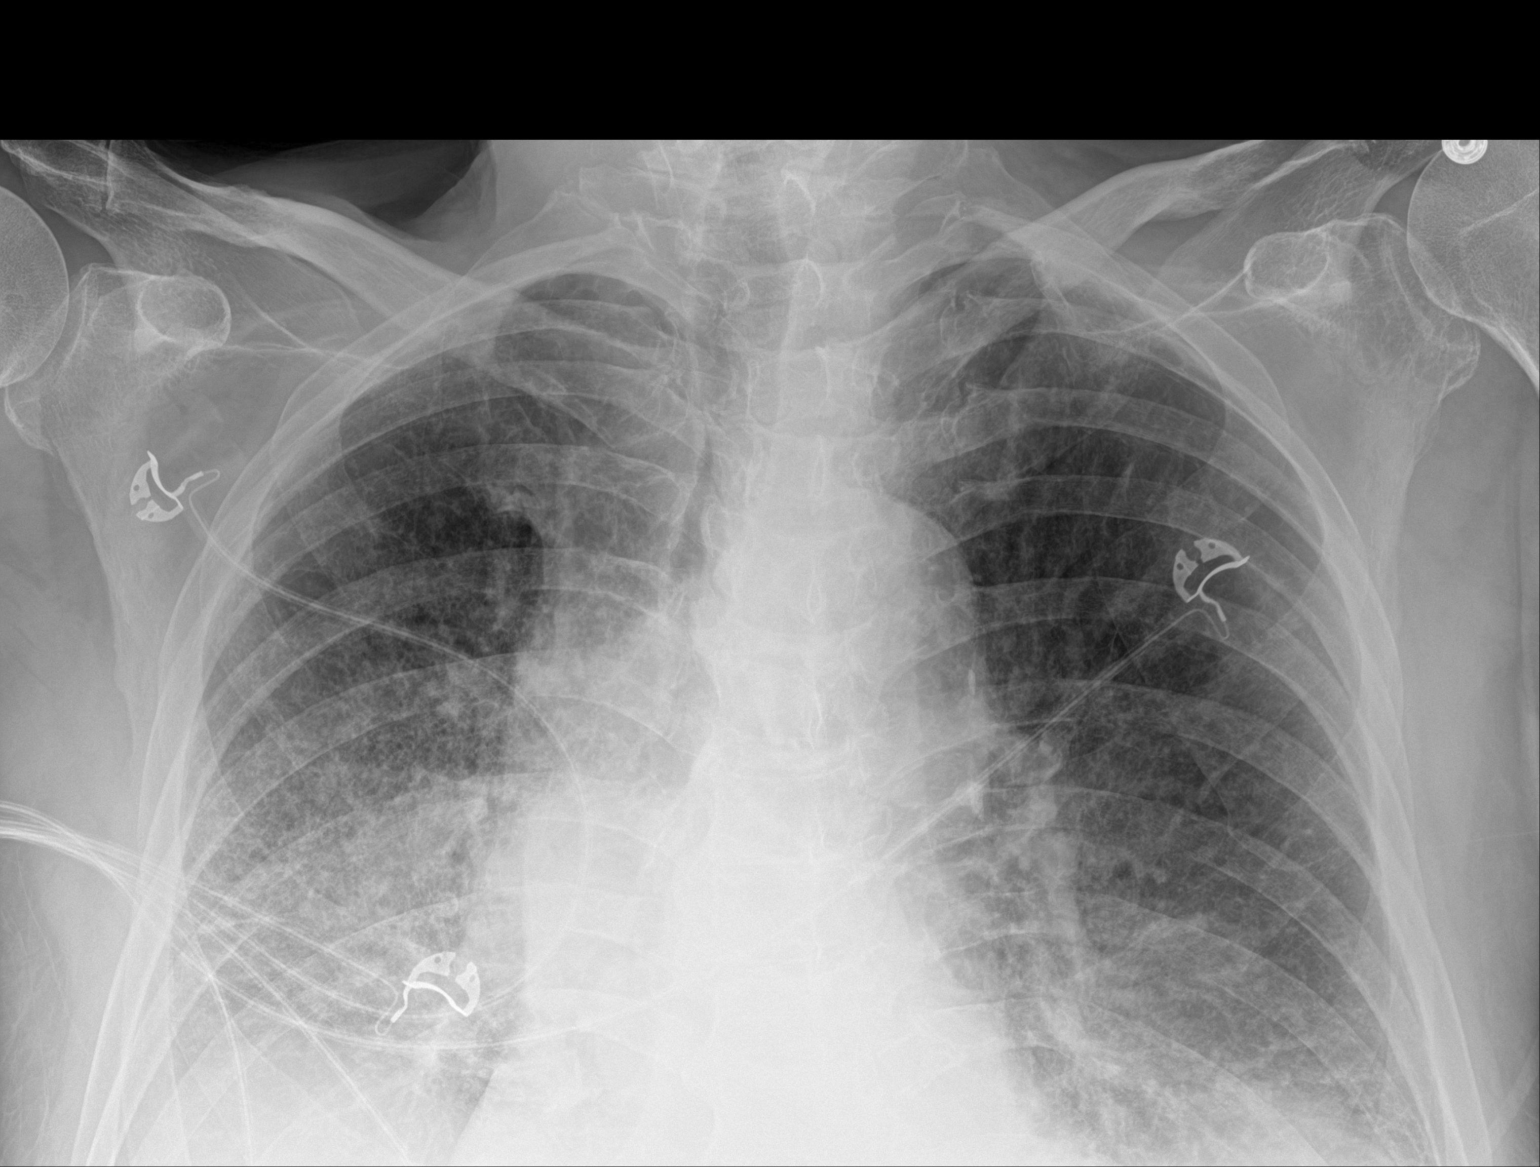
[im 2/2]
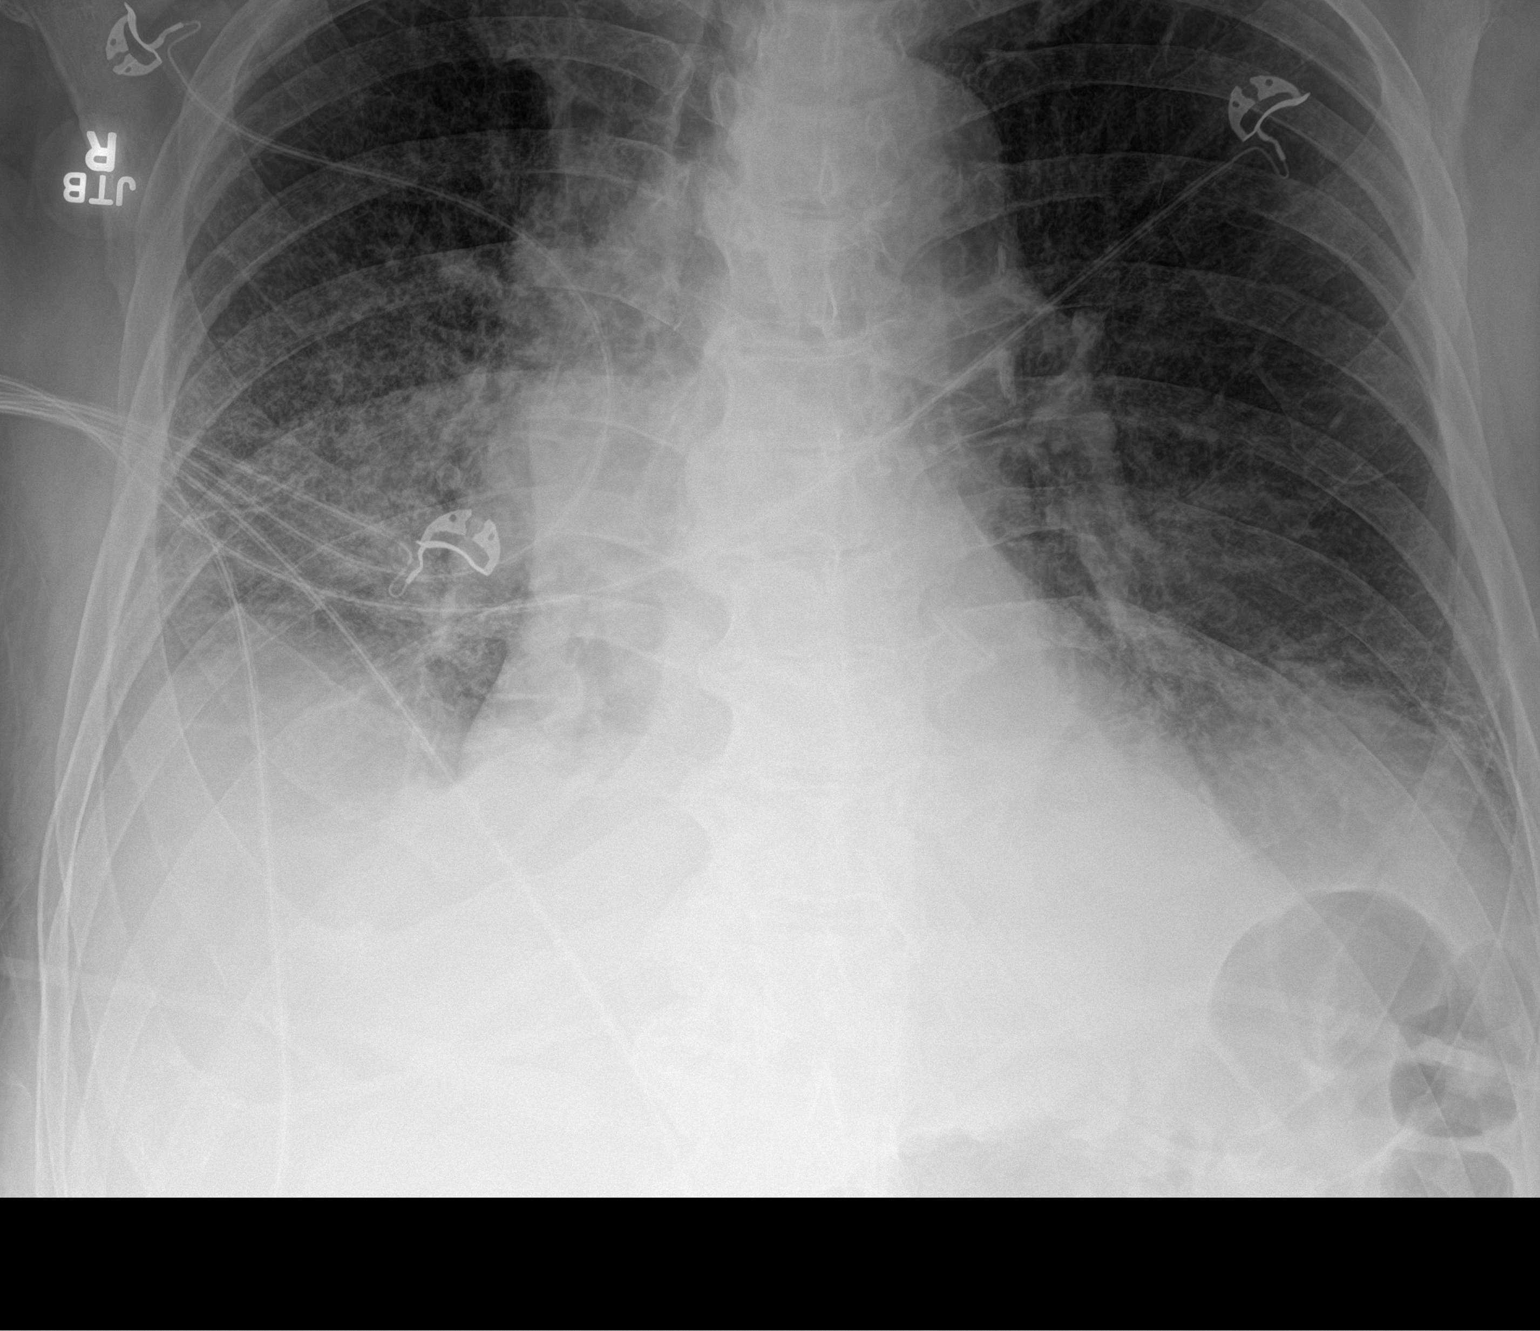

[2 of 2 positions shown; findings below may reference images not displayed]

FINDINGS: Nasogastric tube removed.

Normal heart size, mediastinal contours, and pulmonary vascularity.

Atherosclerotic calcification aorta.

Airspace infiltrates in the mid to lower lungs bilaterally greater
on RIGHT, least affecting LEFT upper lobe.

No pleural effusion or pneumothorax.

Osseous structures unremarkable.
IMPRESSION: Persistent BILATERAL pulmonary infiltrates consistent with
multifocal pneumonia, slightly greater on RIGHT.

## 2021-08-09 IMAGING — DX DG ABD PORTABLE 1V
1 series · 1 of 1 positions shown · non-contrast
Comparison: 04/21/2020 CT abdomen pelvis. 04/17/2020 abdominal
radiographs.

CLINICAL DATA: NG tube placement.

EXAM:
PORTABLE ABDOMEN - 1 VIEW

[abdomen kub]
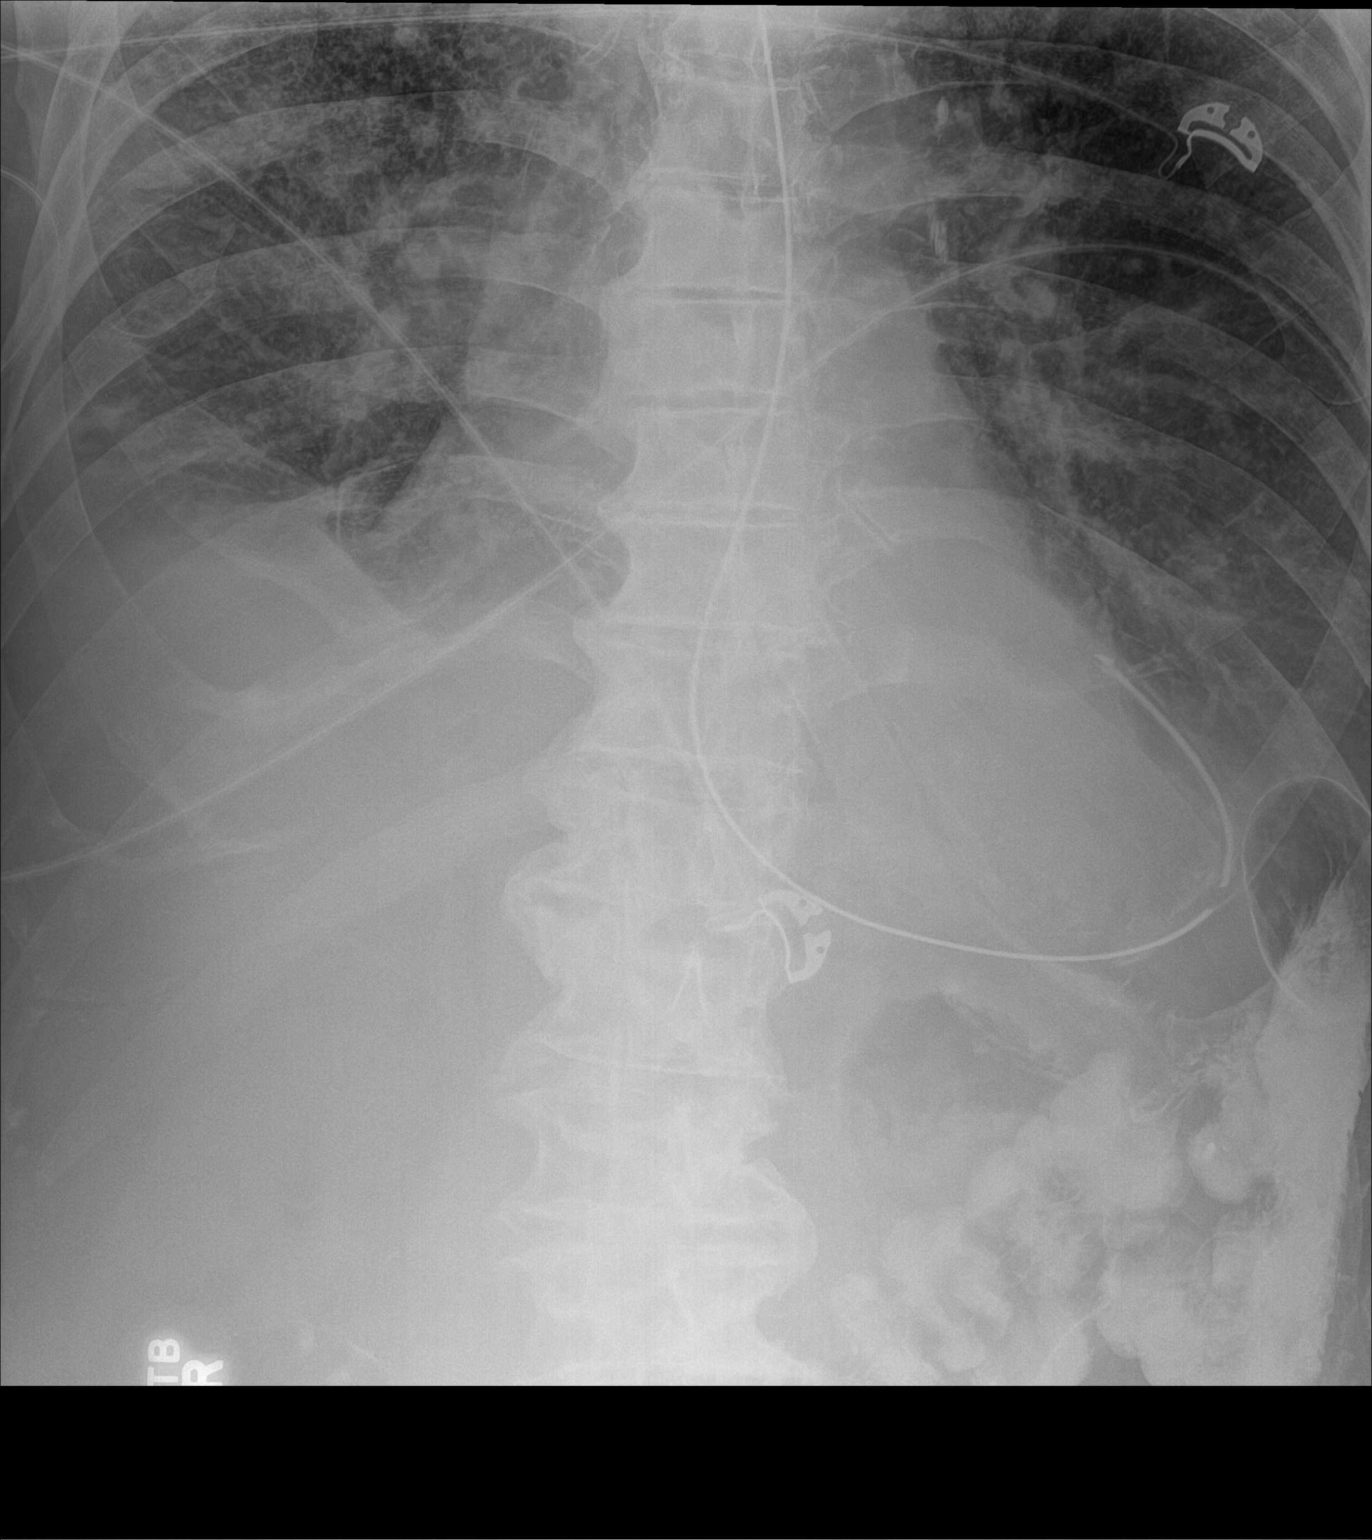

[1 of 1 positions shown; findings below may reference images not displayed]

FINDINGS: Non weighted enteric tube tip and side hole overlie the gastric
body. Residual enteric contrast opacifies nondilated large bowel.

Bibasilar opacities are better evaluated on concurrent chest
radiograph.
IMPRESSION: Enteric tube tip and side hole overlie the gastric body.
